# Patient Record
Sex: Male | Born: 1935 | Race: White | Hispanic: No | Smoking: Former smoker
Health system: Southern US, Community
[De-identification: ages and names within clinical notes are randomized; demographics above are authoritative.]

## PROBLEM LIST (undated history)

## (undated) DIAGNOSIS — K08109 Complete loss of teeth, unspecified cause, unspecified class: Secondary | ICD-10-CM

## (undated) DIAGNOSIS — K3184 Gastroparesis: Secondary | ICD-10-CM

## (undated) DIAGNOSIS — K5790 Diverticulosis of intestine, part unspecified, without perforation or abscess without bleeding: Secondary | ICD-10-CM

## (undated) DIAGNOSIS — K209 Esophagitis, unspecified without bleeding: Secondary | ICD-10-CM

## (undated) DIAGNOSIS — I10 Essential (primary) hypertension: Secondary | ICD-10-CM

## (undated) DIAGNOSIS — Z973 Presence of spectacles and contact lenses: Secondary | ICD-10-CM

## (undated) DIAGNOSIS — R339 Retention of urine, unspecified: Secondary | ICD-10-CM

## (undated) DIAGNOSIS — G629 Polyneuropathy, unspecified: Secondary | ICD-10-CM

## (undated) DIAGNOSIS — F32A Depression, unspecified: Secondary | ICD-10-CM

## (undated) DIAGNOSIS — G4733 Obstructive sleep apnea (adult) (pediatric): Secondary | ICD-10-CM

## (undated) DIAGNOSIS — R7303 Prediabetes: Secondary | ICD-10-CM

## (undated) DIAGNOSIS — I2510 Atherosclerotic heart disease of native coronary artery without angina pectoris: Secondary | ICD-10-CM

## (undated) DIAGNOSIS — M1990 Unspecified osteoarthritis, unspecified site: Secondary | ICD-10-CM

## (undated) DIAGNOSIS — I491 Atrial premature depolarization: Secondary | ICD-10-CM

## (undated) DIAGNOSIS — K449 Diaphragmatic hernia without obstruction or gangrene: Secondary | ICD-10-CM

## (undated) DIAGNOSIS — R55 Syncope and collapse: Secondary | ICD-10-CM

## (undated) DIAGNOSIS — Z860101 Personal history of adenomatous and serrated colon polyps: Secondary | ICD-10-CM

## (undated) DIAGNOSIS — N210 Calculus in bladder: Secondary | ICD-10-CM

## (undated) DIAGNOSIS — R9439 Abnormal result of other cardiovascular function study: Secondary | ICD-10-CM

## (undated) DIAGNOSIS — I4891 Unspecified atrial fibrillation: Secondary | ICD-10-CM

## (undated) DIAGNOSIS — Z86010 Personal history of colonic polyps: Secondary | ICD-10-CM

## (undated) DIAGNOSIS — C439 Malignant melanoma of skin, unspecified: Secondary | ICD-10-CM

## (undated) DIAGNOSIS — Z87442 Personal history of urinary calculi: Secondary | ICD-10-CM

## (undated) DIAGNOSIS — E538 Deficiency of other specified B group vitamins: Secondary | ICD-10-CM

## (undated) DIAGNOSIS — N401 Enlarged prostate with lower urinary tract symptoms: Secondary | ICD-10-CM

## (undated) DIAGNOSIS — F0390 Unspecified dementia without behavioral disturbance: Secondary | ICD-10-CM

## (undated) DIAGNOSIS — I700 Atherosclerosis of aorta: Secondary | ICD-10-CM

## (undated) DIAGNOSIS — I493 Ventricular premature depolarization: Principal | ICD-10-CM

## (undated) DIAGNOSIS — R011 Cardiac murmur, unspecified: Secondary | ICD-10-CM

## (undated) DIAGNOSIS — Z972 Presence of dental prosthetic device (complete) (partial): Secondary | ICD-10-CM

## (undated) DIAGNOSIS — H3530 Unspecified macular degeneration: Secondary | ICD-10-CM

## (undated) DIAGNOSIS — C801 Malignant (primary) neoplasm, unspecified: Secondary | ICD-10-CM

## (undated) DIAGNOSIS — I471 Supraventricular tachycardia: Secondary | ICD-10-CM

## (undated) DIAGNOSIS — E785 Hyperlipidemia, unspecified: Secondary | ICD-10-CM

## (undated) DIAGNOSIS — K219 Gastro-esophageal reflux disease without esophagitis: Secondary | ICD-10-CM

## (undated) DIAGNOSIS — Z8719 Personal history of other diseases of the digestive system: Secondary | ICD-10-CM

## (undated) DIAGNOSIS — I499 Cardiac arrhythmia, unspecified: Secondary | ICD-10-CM

## (undated) HISTORY — DX: Supraventricular tachycardia: I47.1

## (undated) HISTORY — PX: HEMORRHOID SURGERY: SHX153

## (undated) HISTORY — DX: Atherosclerosis of aorta: I70.0

## (undated) HISTORY — DX: Obstructive sleep apnea (adult) (pediatric): G47.33

## (undated) HISTORY — DX: Malignant melanoma of skin, unspecified: C43.9

## (undated) HISTORY — DX: Syncope and collapse: R55

## (undated) HISTORY — DX: Unspecified macular degeneration: H35.30

## (undated) HISTORY — DX: Hyperlipidemia, unspecified: E78.5

## (undated) HISTORY — DX: Essential (primary) hypertension: I10

## (undated) HISTORY — DX: Esophagitis, unspecified without bleeding: K20.90

## (undated) HISTORY — PX: NOSE SURGERY: SHX723

## (undated) HISTORY — DX: Gastro-esophageal reflux disease without esophagitis: K21.9

## (undated) HISTORY — DX: Atrial premature depolarization: I49.1

## (undated) HISTORY — PX: CATARACT EXTRACTION W/ INTRAOCULAR LENS IMPLANT: SHX1309

## (undated) HISTORY — PX: GASTRECTOMY: SHX58

## (undated) HISTORY — PX: TONSILLECTOMY: SUR1361

## (undated) HISTORY — DX: Diverticulosis of intestine, part unspecified, without perforation or abscess without bleeding: K57.90

## (undated) HISTORY — DX: Atherosclerotic heart disease of native coronary artery without angina pectoris: I25.10

## (undated) HISTORY — DX: Abnormal result of other cardiovascular function study: R94.39

## (undated) HISTORY — DX: Ventricular premature depolarization: I49.3

## (undated) HISTORY — PX: CATARACT EXTRACTION: SUR2

## (undated) HISTORY — PX: APPENDECTOMY: SHX54

---

## 1998-05-24 ENCOUNTER — Ambulatory Visit (HOSPITAL_COMMUNITY): Admission: RE | Admit: 1998-05-24 | Discharge: 1998-05-24 | Payer: Self-pay | Admitting: Internal Medicine

## 2001-12-17 ENCOUNTER — Emergency Department (HOSPITAL_COMMUNITY): Admission: EM | Admit: 2001-12-17 | Discharge: 2001-12-17 | Payer: Self-pay

## 2001-12-23 ENCOUNTER — Emergency Department (HOSPITAL_COMMUNITY): Admission: EM | Admit: 2001-12-23 | Discharge: 2001-12-23 | Payer: Self-pay | Admitting: Emergency Medicine

## 2002-01-19 ENCOUNTER — Ambulatory Visit (HOSPITAL_BASED_OUTPATIENT_CLINIC_OR_DEPARTMENT_OTHER): Admission: RE | Admit: 2002-01-19 | Discharge: 2002-01-19 | Payer: Self-pay | Admitting: Internal Medicine

## 2004-10-07 ENCOUNTER — Ambulatory Visit: Payer: Self-pay | Admitting: Internal Medicine

## 2005-04-17 ENCOUNTER — Ambulatory Visit: Payer: Self-pay | Admitting: Internal Medicine

## 2005-06-16 ENCOUNTER — Ambulatory Visit: Payer: Self-pay | Admitting: Internal Medicine

## 2005-09-30 ENCOUNTER — Ambulatory Visit: Payer: Self-pay | Admitting: Internal Medicine

## 2005-12-18 ENCOUNTER — Ambulatory Visit: Payer: Self-pay | Admitting: Internal Medicine

## 2005-12-25 ENCOUNTER — Ambulatory Visit: Payer: Self-pay | Admitting: Internal Medicine

## 2006-08-27 ENCOUNTER — Ambulatory Visit: Payer: Self-pay | Admitting: Internal Medicine

## 2006-11-16 DIAGNOSIS — Z85820 Personal history of malignant melanoma of skin: Secondary | ICD-10-CM

## 2006-11-16 HISTORY — DX: Personal history of malignant melanoma of skin: Z85.820

## 2007-03-17 ENCOUNTER — Ambulatory Visit: Payer: Self-pay | Admitting: Internal Medicine

## 2007-03-17 LAB — CONVERTED CEMR LAB
ALT: 20 units/L (ref 0–40)
AST: 27 units/L (ref 0–37)
Albumin: 4.2 g/dL (ref 3.5–5.2)
Alkaline Phosphatase: 65 units/L (ref 39–117)
BUN: 10 mg/dL (ref 6–23)
Bilirubin, Direct: 0.1 mg/dL (ref 0.0–0.3)
CO2: 37 meq/L — ABNORMAL HIGH (ref 19–32)
Calcium: 10.1 mg/dL (ref 8.4–10.5)
Chloride: 99 meq/L (ref 96–112)
Creatinine, Ser: 0.9 mg/dL (ref 0.4–1.5)
GFR calc Af Amer: 107 mL/min
GFR calc non Af Amer: 89 mL/min
Glucose, Bld: 97 mg/dL (ref 70–99)
Potassium: 4.2 meq/L (ref 3.5–5.1)
Sodium: 143 meq/L (ref 135–145)
TSH: 3.53 microintl units/mL (ref 0.35–5.50)
Total Bilirubin: 1.5 mg/dL — ABNORMAL HIGH (ref 0.3–1.2)
Total Protein: 7.2 g/dL (ref 6.0–8.3)

## 2007-05-02 ENCOUNTER — Encounter: Admission: RE | Admit: 2007-05-02 | Discharge: 2007-05-02 | Payer: Self-pay | Admitting: General Surgery

## 2007-05-05 HISTORY — PX: MELANOMA EXCISION: SHX5266

## 2007-05-06 ENCOUNTER — Ambulatory Visit (HOSPITAL_BASED_OUTPATIENT_CLINIC_OR_DEPARTMENT_OTHER): Admission: RE | Admit: 2007-05-06 | Discharge: 2007-05-06 | Payer: Self-pay | Admitting: General Surgery

## 2007-05-06 ENCOUNTER — Encounter (INDEPENDENT_AMBULATORY_CARE_PROVIDER_SITE_OTHER): Payer: Self-pay | Admitting: General Surgery

## 2007-08-31 ENCOUNTER — Ambulatory Visit: Payer: Self-pay | Admitting: Internal Medicine

## 2007-09-23 DIAGNOSIS — G4733 Obstructive sleep apnea (adult) (pediatric): Secondary | ICD-10-CM | POA: Insufficient documentation

## 2007-09-23 DIAGNOSIS — K219 Gastro-esophageal reflux disease without esophagitis: Secondary | ICD-10-CM | POA: Insufficient documentation

## 2007-09-23 DIAGNOSIS — I10 Essential (primary) hypertension: Secondary | ICD-10-CM | POA: Insufficient documentation

## 2007-09-23 HISTORY — DX: Obstructive sleep apnea (adult) (pediatric): G47.33

## 2007-09-26 ENCOUNTER — Ambulatory Visit: Payer: Self-pay | Admitting: Internal Medicine

## 2007-09-26 DIAGNOSIS — Z85820 Personal history of malignant melanoma of skin: Secondary | ICD-10-CM | POA: Insufficient documentation

## 2007-09-27 LAB — CONVERTED CEMR LAB
ALT: 22 units/L (ref 0–53)
AST: 25 units/L (ref 0–37)
Albumin: 4 g/dL (ref 3.5–5.2)
Alkaline Phosphatase: 59 units/L (ref 39–117)
BUN: 9 mg/dL (ref 6–23)
Basophils Absolute: 0 10*3/uL (ref 0.0–0.1)
Basophils Relative: 0.5 % (ref 0.0–1.0)
Bilirubin, Direct: 0.3 mg/dL (ref 0.0–0.3)
CO2: 36 meq/L — ABNORMAL HIGH (ref 19–32)
Calcium: 9.9 mg/dL (ref 8.4–10.5)
Chloride: 98 meq/L (ref 96–112)
Cholesterol: 194 mg/dL (ref 0–200)
Creatinine, Ser: 0.9 mg/dL (ref 0.4–1.5)
Eosinophils Absolute: 0.3 10*3/uL (ref 0.0–0.6)
Eosinophils Relative: 4 % (ref 0.0–5.0)
GFR calc Af Amer: 107 mL/min
GFR calc non Af Amer: 89 mL/min
Glucose, Bld: 83 mg/dL (ref 70–99)
HCT: 48.2 % (ref 39.0–52.0)
HDL: 32.7 mg/dL — ABNORMAL LOW (ref >39.0)
Hemoglobin: 16.9 g/dL (ref 13.0–17.0)
LDL Cholesterol: 132 mg/dL — ABNORMAL HIGH (ref 0–99)
Lymphocytes Relative: 25.3 % (ref 12.0–46.0)
MCHC: 35.1 g/dL (ref 30.0–36.0)
MCV: 90.9 fL (ref 78.0–100.0)
Monocytes Absolute: 0.7 10*3/uL (ref 0.2–0.7)
Monocytes Relative: 10.5 % (ref 3.0–11.0)
Neutro Abs: 4.3 10*3/uL (ref 1.4–7.7)
Neutrophils Relative %: 59.7 % (ref 43.0–77.0)
Platelets: 154 10*3/uL (ref 150–400)
Potassium: 3.6 meq/L (ref 3.5–5.1)
RBC: 5.31 M/uL (ref 4.22–5.81)
RDW: 11.8 % (ref 11.5–14.6)
Sodium: 143 meq/L (ref 135–145)
Total Bilirubin: 1.7 mg/dL — ABNORMAL HIGH (ref 0.3–1.2)
Total CHOL/HDL Ratio: 5.9
Total Protein: 6.9 g/dL (ref 6.0–8.3)
Triglycerides: 145 mg/dL (ref 0–149)
VLDL: 29 mg/dL (ref 0–40)
WBC: 7.1 10*3/uL (ref 4.5–10.5)

## 2007-10-17 LAB — CONVERTED CEMR LAB: PSA: NORMAL ng/mL

## 2008-01-15 LAB — HM COLONOSCOPY: HM Colonoscopy: NORMAL

## 2008-02-13 ENCOUNTER — Encounter: Payer: Self-pay | Admitting: Internal Medicine

## 2008-04-04 ENCOUNTER — Ambulatory Visit: Payer: Self-pay | Admitting: Internal Medicine

## 2008-10-09 ENCOUNTER — Ambulatory Visit: Payer: Self-pay | Admitting: Internal Medicine

## 2008-10-10 LAB — CONVERTED CEMR LAB
BUN: 10 mg/dL (ref 6–23)
CO2: 38 meq/L — ABNORMAL HIGH (ref 19–32)
Calcium: 9.8 mg/dL (ref 8.4–10.5)
Chloride: 100 meq/L (ref 96–112)
Cholesterol: 184 mg/dL (ref 0–200)
Creatinine, Ser: 0.8 mg/dL (ref 0.4–1.5)
GFR calc Af Amer: 123 mL/min
GFR calc non Af Amer: 101 mL/min
Glucose, Bld: 98 mg/dL (ref 70–99)
HDL: 42.8 mg/dL (ref >39.0)
LDL Cholesterol: 126 mg/dL — ABNORMAL HIGH (ref 0–99)
Potassium: 3.9 meq/L (ref 3.5–5.1)
Sodium: 144 meq/L (ref 135–145)
TSH: 2.91 microintl units/mL (ref 0.35–5.50)
Total CHOL/HDL Ratio: 4.3
Triglycerides: 77 mg/dL (ref 0–149)
VLDL: 15 mg/dL (ref 0–40)

## 2008-11-16 DIAGNOSIS — I493 Ventricular premature depolarization: Secondary | ICD-10-CM

## 2008-11-16 DIAGNOSIS — Z87898 Personal history of other specified conditions: Secondary | ICD-10-CM

## 2008-11-16 HISTORY — DX: Ventricular premature depolarization: I49.3

## 2008-11-16 HISTORY — DX: Personal history of other specified conditions: Z87.898

## 2009-04-02 ENCOUNTER — Ambulatory Visit: Payer: Self-pay | Admitting: Internal Medicine

## 2009-04-16 ENCOUNTER — Ambulatory Visit: Payer: Self-pay

## 2009-04-16 ENCOUNTER — Encounter: Payer: Self-pay | Admitting: Cardiology

## 2009-04-16 ENCOUNTER — Encounter: Payer: Self-pay | Admitting: Internal Medicine

## 2009-04-17 ENCOUNTER — Encounter: Payer: Self-pay | Admitting: Internal Medicine

## 2009-10-07 ENCOUNTER — Ambulatory Visit: Payer: Self-pay | Admitting: Internal Medicine

## 2009-10-09 LAB — CONVERTED CEMR LAB
ALT: 20 units/L (ref 0–53)
AST: 29 units/L (ref 0–37)
Albumin: 4.3 g/dL (ref 3.5–5.2)
Alkaline Phosphatase: 62 units/L (ref 39–117)
BUN: 8 mg/dL (ref 6–23)
Basophils Absolute: 0 10*3/uL (ref 0.0–0.1)
Basophils Relative: 0.7 % (ref 0.0–3.0)
Bilirubin, Direct: 0.1 mg/dL (ref 0.0–0.3)
CO2: 38 meq/L — ABNORMAL HIGH (ref 19–32)
Calcium: 9.8 mg/dL (ref 8.4–10.5)
Chloride: 95 meq/L — ABNORMAL LOW (ref 96–112)
Cholesterol: 191 mg/dL (ref 0–200)
Creatinine, Ser: 0.9 mg/dL (ref 0.4–1.5)
Eosinophils Absolute: 0.1 10*3/uL (ref 0.0–0.7)
Eosinophils Relative: 2.9 % (ref 0.0–5.0)
GFR calc non Af Amer: 87.94 mL/min (ref >60)
Glucose, Bld: 112 mg/dL — ABNORMAL HIGH (ref 70–99)
HCT: 50 % (ref 39.0–52.0)
HDL: 41.7 mg/dL (ref >39.00)
Hemoglobin: 17.1 g/dL — ABNORMAL HIGH (ref 13.0–17.0)
LDL Cholesterol: 133 mg/dL — ABNORMAL HIGH (ref 0–99)
Lymphocytes Relative: 21.5 % (ref 12.0–46.0)
Lymphs Abs: 1.1 10*3/uL (ref 0.7–4.0)
MCHC: 34.1 g/dL (ref 30.0–36.0)
MCV: 95.3 fL (ref 78.0–100.0)
Monocytes Absolute: 0.5 10*3/uL (ref 0.1–1.0)
Monocytes Relative: 9.9 % (ref 3.0–12.0)
Neutro Abs: 3.3 10*3/uL (ref 1.4–7.7)
Neutrophils Relative %: 65 % (ref 43.0–77.0)
PSA: 1.63 ng/mL (ref 0.10–4.00)
Platelets: 146 10*3/uL — ABNORMAL LOW (ref 150.0–400.0)
Potassium: 3.3 meq/L — ABNORMAL LOW (ref 3.5–5.1)
RBC: 5.24 M/uL (ref 4.22–5.81)
RDW: 12.4 % (ref 11.5–14.6)
Sodium: 142 meq/L (ref 135–145)
TSH: 3.64 microintl units/mL (ref 0.35–5.50)
Total Bilirubin: 1.6 mg/dL — ABNORMAL HIGH (ref 0.3–1.2)
Total CHOL/HDL Ratio: 5
Total Protein: 7.4 g/dL (ref 6.0–8.3)
Triglycerides: 81 mg/dL (ref 0.0–149.0)
VLDL: 16.2 mg/dL (ref 0.0–40.0)
WBC: 5 10*3/uL (ref 4.5–10.5)

## 2009-11-20 ENCOUNTER — Ambulatory Visit: Payer: Self-pay | Admitting: Internal Medicine

## 2009-11-21 LAB — CONVERTED CEMR LAB
BUN: 8 mg/dL (ref 6–23)
CO2: 32 meq/L (ref 19–32)
Calcium: 9.3 mg/dL (ref 8.4–10.5)
Chloride: 101 meq/L (ref 96–112)
Creatinine, Ser: 0.8 mg/dL (ref 0.4–1.5)
GFR calc non Af Amer: 100.71 mL/min (ref >60)
Glucose, Bld: 102 mg/dL — ABNORMAL HIGH (ref 70–99)
Potassium: 4.1 meq/L (ref 3.5–5.1)
Sodium: 139 meq/L (ref 135–145)

## 2010-04-07 ENCOUNTER — Ambulatory Visit: Payer: Self-pay | Admitting: Internal Medicine

## 2010-05-01 ENCOUNTER — Encounter: Payer: Self-pay | Admitting: Internal Medicine

## 2010-08-23 ENCOUNTER — Emergency Department (HOSPITAL_COMMUNITY): Admission: EM | Admit: 2010-08-23 | Discharge: 2010-08-23 | Payer: Self-pay | Admitting: Emergency Medicine

## 2010-10-01 ENCOUNTER — Ambulatory Visit: Payer: Self-pay | Admitting: Internal Medicine

## 2010-10-01 ENCOUNTER — Encounter: Payer: Self-pay | Admitting: Internal Medicine

## 2010-10-01 ENCOUNTER — Ambulatory Visit: Payer: Self-pay | Admitting: Family Medicine

## 2010-10-02 ENCOUNTER — Encounter: Payer: Self-pay | Admitting: Internal Medicine

## 2010-10-07 LAB — CONVERTED CEMR LAB
BUN: 10 mg/dL (ref 6–23)
Basophils Absolute: 0 10*3/uL (ref 0.0–0.1)
Basophils Relative: 0.3 % (ref 0.0–3.0)
CO2: 32 meq/L (ref 19–32)
Calcium: 9.2 mg/dL (ref 8.4–10.5)
Chloride: 101 meq/L (ref 96–112)
Cholesterol: 185 mg/dL (ref 0–200)
Creatinine, Ser: 0.9 mg/dL (ref 0.4–1.5)
Eosinophils Absolute: 0.2 10*3/uL (ref 0.0–0.7)
Eosinophils Relative: 3.4 % (ref 0.0–5.0)
GFR calc non Af Amer: 93.68 mL/min (ref >60)
Glucose, Bld: 74 mg/dL (ref 70–99)
HCT: 44.9 % (ref 39.0–52.0)
HDL: 47.2 mg/dL (ref >39.00)
Hemoglobin: 15.3 g/dL (ref 13.0–17.0)
LDL Cholesterol: 121 mg/dL — ABNORMAL HIGH (ref 0–99)
Lymphocytes Relative: 16.7 % (ref 12.0–46.0)
Lymphs Abs: 0.9 10*3/uL (ref 0.7–4.0)
MCHC: 34.1 g/dL (ref 30.0–36.0)
MCV: 94 fL (ref 78.0–100.0)
Monocytes Absolute: 0.5 10*3/uL (ref 0.1–1.0)
Monocytes Relative: 9.5 % (ref 3.0–12.0)
Neutro Abs: 3.9 10*3/uL (ref 1.4–7.7)
Neutrophils Relative %: 70.1 % (ref 43.0–77.0)
PSA: 2.42 ng/mL (ref 0.10–4.00)
Platelets: 130 10*3/uL — ABNORMAL LOW (ref 150.0–400.0)
Potassium: 3.6 meq/L (ref 3.5–5.1)
RBC: 4.78 M/uL (ref 4.22–5.81)
RDW: 13.1 % (ref 11.5–14.6)
Sodium: 140 meq/L (ref 135–145)
TSH: 2.85 microintl units/mL (ref 0.35–5.50)
Total CHOL/HDL Ratio: 4
Triglycerides: 83 mg/dL (ref 0.0–149.0)
VLDL: 16.6 mg/dL (ref 0.0–40.0)
WBC: 5.6 10*3/uL (ref 4.5–10.5)

## 2010-12-16 NOTE — Procedures (Signed)
Summary: summary report  summary report   Imported By: Mirna Mires 04/29/2009 14:37:49  _____________________________________________________________________  External Attachment:    Type:   Image     Comment:   External Document

## 2010-12-16 NOTE — Assessment & Plan Note (Signed)
Summary: 6 month f/up//db   Vital Signs:  Patient Profile:   76 Years Old Male Weight:      177 pounds Temp:     98.4 degrees F Pulse rate:   64 / minute BP sitting:   122 / 68  (left arm)  Vitals Entered By: Gladis Riffle, RN (Apr 04, 2008 11:12 AM)                 Chief Complaint:  6 month rov--staes pecid was upgraded since ECG and is working well.  History of Present Illness:  Follow-Up Visit      This is a 76 year old man who presents for Follow-up visit.  The patient denies chest pain, palpitations, dizziness, syncope, low blood sugar symptoms, high blood sugar symptoms, edema, SOB, DOE, PND, and orthopnea.  Since the last visit the patient notes no new problems or concerns.  The patient reports taking meds as prescribed.  When questioned about possible medication side effects, the patient notes none.    Past Medical History: GERD Hypertension syncope obstructive sleep apnea dyslipidemia melanoma Past Surgical History: Gastrectomy, partial (ulcers)  1970's Hemorrhoidectomy melanoma-excisec from left arm 2008 Social History: Married Former Smoker Regular exercise-no  Family History: pneumonia  father age 79 CABG  mother stomach surgery--brother breast cancer--sister Current Meds:  PEPCID 20 MG  TABS (FAMOTIDINE) two times a day--states was changed after ecg, not sure of med REGLAN 10 MG  TABS (METOCLOPRAMIDE HCL) Take 1 tablet by mouth two times a day HYDROCHLOROTHIAZIDE 25 MG  TABS (HYDROCHLOROTHIAZIDE) Take 1 tablet by mouth once a day      Current Allergies (reviewed today): No known allergies       Physical Exam  General:     Well-developed,well-nourished,in no acute distress; alert,appropriate and cooperative throughout examination Head:     normocephalic and atraumatic.   Eyes:     pupils equal and pupils round.   Ears:     R ear normal and L ear normal.   Nose:     External nasal examination shows no deformity or inflammation. Nasal  mucosa are pink and moist without lesions or exudates. Neck:     No deformities, masses, or tenderness noted. Chest Wall:     No deformities, masses, tenderness or gynecomastia noted. Lungs:     Normal respiratory effort, chest expands symmetrically. Lungs are clear to auscultation, no crackles or wheezes. Heart:     Normal rate and regular rhythm. S1 and S2 normal without gallop, murmur, click, rub or other extra sounds. Abdomen:     Bowel sounds positive,abdomen soft and non-tender without masses, organomegaly or hernias noted. Msk:     No deformity or scoliosis noted of thoracic or lumbar spine.   Pulses:     R radial normal and L radial normal.   Extremities:     trace left pedal edema and trace right pedal edema.   Neurologic:     cranial nerves II-XII intact and gait normal.   Skin:     Intact without suspicious lesions or rashes Cervical Nodes:     no anterior cervical adenopathy and no posterior cervical adenopathy.   Psych:     normally interactive and good eye contact.      Impression & Recommendations:  Problem # 1:  HYPERTENSION (ICD-401.9) continue meds His updated medication list for this problem includes:    Hydrochlorothiazide 25 Mg Tabs (Hydrochlorothiazide) .Marland Kitchen... Take 1 tablet by mouth once a day  BP today: 122/68  Prior BP: 120/82 (09/26/2007)  Labs Reviewed: Creat: 0.9 (09/26/2007) Chol: 194 (09/26/2007)   HDL: 32.7 (09/26/2007)   LDL: 132 (09/26/2007)   TG: 145 (09/26/2007)   Problem # 2:  GERD (ICD-530.81) no sxs on meds His updated medication list for this problem includes:    Pepcid 20 Mg Tabs (Famotidine) .Marland Kitchen..Marland Kitchen Two times a day--states was changed after ecg, not sure of med  Diagnostics Reviewed:  Discussed lifestyle modifications, diet, antacids/medications, and preventive measures. Handout provided.   Problem # 3:  DYSLIPIDEMIA (ICD-272.4) discussed diet and exercise to help increase HDL Labs Reviewed: Chol: 194 (09/26/2007)   HDL: 32.7  (09/26/2007)   LDL: 132 (09/26/2007)   TG: 145 (09/26/2007) SGOT: 25 (09/26/2007)   SGPT: 22 (09/26/2007)   Complete Medication List: 1)  Pepcid 20 Mg Tabs (Famotidine) .... Two times a day--states was changed after ecg, not sure of med 2)  Reglan 10 Mg Tabs (Metoclopramide hcl) .... Take 1 tablet by mouth two times a day 3)  Hydrochlorothiazide 25 Mg Tabs (Hydrochlorothiazide) .... Take 1 tablet by mouth once a day   Patient Instructions: 1)  Please schedule a follow-up appointment in 6 months.   ]

## 2010-12-16 NOTE — Letter (Signed)
Summary: [facility]  [facility]   Imported By: Maryln Gottron 05/12/2010 14:57:58  _____________________________________________________________________  External Attachment:    Type:   Image     Comment:   External Document

## 2010-12-16 NOTE — Assessment & Plan Note (Signed)
Summary: 6 month rov/njr/PT RESCD FROM BUMP//CCM   Vital Signs:  Patient profile:   76 year old male Height:      70.5 inches Weight:      175 pounds BMI:     24.84 Temp:     98.4 degrees F oral Pulse rate:   78 / minute Pulse rhythm:   regular Resp:     16 per minute BP sitting:   132 / 72  Vitals Entered By: Lynann Beaver CMA (Apr 02, 2009 12:10 PM)   History of Present Illness:  Follow-Up Visit      This is a 76 year old man who presents for Follow-up visit.  The patient denies chest pain, palpitations, dizziness, syncope, low blood sugar symptoms, high blood sugar symptoms, edema, SOB, DOE, PND, and orthopnea.  Since the last visit the patient notes being seen by a specialist.  The patient reports taking meds as prescribed.  When questioned about possible medication side effects, the patient notes none.    seeing ophthalmologist---intraocular avastin injection has had recent skin surgery says he can feel heart beat fast when he mows his yard---persistent symptoms for hours (when he walks for exercise no sxs)  no other complaints in a complete ROS   Current Medications (verified): 1)  Reglan 10 Mg  Tabs (Metoclopramide Hcl) .... Take 1 Tablet By Mouth Two Times A Day 2)  Hydrochlorothiazide 25 Mg  Tabs (Hydrochlorothiazide) .... Take 1 Tablet By Mouth Once A Day 3)  Protonix 40 Mg Tbec (Pantoprazole Sodium) .... One By Mouth Q Day 4)  Avastin 400 Mg/64ml Soln (Bevacizumab) .... Injection As Directed  Allergies (verified): No Known Drug Allergies  Past History:  Past Medical History:    GERD    Hypertension    syncope    obstructive sleep apnea    dyslipidemia    melanoma (09/26/2007)  Family History:    pneumonia  father age 40    CABG  mother    stomach surgery--brother    breast cancer--sister (09/23/2007)  Social History:    Married    Former Smoker    Regular exercise-no     (09/26/2007)  Risk Factors:    Smoking Status: quit (09/26/2007)  Packs/Day: N/A    Cigars/wk: N/A    Pipe Use/wk: N/A    Cans of tobacco/wk: N/A    Passive Smoke Exposure: N/A  Past Surgical History:    Gastrectomy, partial (ulcers)  1970's    Hemorrhoidectomy    melanoma-excisec from left arm 2008    skin surgery--nose  Review of Systems       no other complaints in a complete ROS except feels slightly lightheaded when he has periods of palpitations ("fast heart rate---mowing the lawn)  Physical Exam  General:  Well-developed,well-nourished,in no acute distress; alert,appropriate and cooperative throughout examination Head:  normocephalic and atraumatic.   Eyes:  pupils equal and pupils round.   Ears:  R ear normal and L ear normal.   Neck:  No deformities, masses, or tenderness noted. Chest Wall:  No deformities, masses, tenderness or gynecomastia noted. Lungs:  Normal respiratory effort, chest expands symmetrically. Lungs are clear to auscultation, no crackles or wheezes. Abdomen:  Bowel sounds positive,abdomen soft and non-tender without masses, organomegaly or hernias noted. Msk:  No deformity or scoliosis noted of thoracic or lumbar spine.   Pulses:  R radial normal and L radial normal.   Skin:  turgor normal and color normal.   Psych:  good eye contact  and not anxious appearing.     Impression & Recommendations:  Problem # 1:  PALPITATIONS (ICD-785.1) unclear etiology needs further evaluation.  Check Holter monitor.  Check echocardiogram.  EKG done today in the office.  See results. Orders: EKG w/ Interpretation (93000) Echo Referral (Echo) Cardiology Referral (Cardiology)  Problem # 2:  MELANOMA, ARM (ICD-172.6) has yearly dermatology exam  Problem # 3:  HYPERTENSION (ICD-401.9) continue current medications. His updated medication list for this problem includes:    Hydrochlorothiazide 25 Mg Tabs (Hydrochlorothiazide) .Marland Kitchen... Take 1 tablet by mouth once a day  BP today: 132/72 Prior BP: 104/68 (10/09/2008)  Labs Reviewed:  K+: 3.9 (10/09/2008) Creat: : 0.8 (10/09/2008)   Chol: 184 (10/09/2008)   HDL: 42.8 (10/09/2008)   LDL: 126 (10/09/2008)   TG: 77 (10/09/2008)  Problem # 4:  GERD (ICD-530.81) well-controlled on current medications. The following medications were removed from the medication list:    Pepcid 20 Mg Tabs (Famotidine) .Marland Kitchen..Marland Kitchen Two times a day--states was changed after ecg, not sure of med His updated medication list for this problem includes:    Protonix 40 Mg Tbec (Pantoprazole sodium) ..... One by mouth q day  Complete Medication List: 1)  Reglan 10 Mg Tabs (Metoclopramide hcl) .... Take 1 tablet by mouth two times a day 2)  Hydrochlorothiazide 25 Mg Tabs (Hydrochlorothiazide) .... Take 1 tablet by mouth once a day 3)  Protonix 40 Mg Tbec (Pantoprazole sodium) .... One by mouth q day 4)  Avastin 400 Mg/82ml Soln (Bevacizumab) .... Injection as directed

## 2010-12-16 NOTE — Assessment & Plan Note (Signed)
Summary: 6 month fup-will fast//ccm/ flu shot/njr   Vital Signs:  Patient profile:   76 year old male Height:      70.5 inches Weight:      172 pounds BMI:     24.42 Pulse rate:   64 / minute Resp:     12 per minute BP sitting:   134 / 70  (left arm)  Vitals Entered By: Gladis Riffle, RN (October 07, 2009 7:52 AM)   History of Present Illness:  Follow-Up Visit      This is a 76 year old man who presents for Follow-up visit.  The patient denies chest pain, palpitations, dizziness, syncope, edema, SOB, DOE, PND, and orthopnea.  Since the last visit the patient notes no new problems or concerns.  The patient reports taking meds as prescribed.  When questioned about possible medication side effects, the patient notes none.    All other systems reviewed and were negative   Preventive Screening-Counseling & Management  Alcohol-Tobacco     Smoking Status: quit > 6 months     Year Started: 1945     Year Quit: 1949  Current Problems (verified): 1)  Palpitations  (ICD-785.1) 2)  Melanoma, Arm  (ICD-172.6) 3)  Dyslipidemia  (ICD-272.4) 4)  Sleep Apnea, Obstructive  (ICD-327.23) 5)  Syncope  (ICD-780.2) 6)  Hypertension  (ICD-401.9) 7)  Gerd  (ICD-530.81)  Allergies (verified): No Known Drug Allergies  Comments:  Nurse/Medical Assistant: 6 month rov, fasting  The patient's medications and allergies were reviewed with the patient and were updated in the Medication and Allergy Lists. Gladis Riffle, RN (October 07, 2009 7:54 AM)  Flu Vaccine Consent Questions     Do you have a history of severe allergic reactions to this vaccine? no    Any prior history of allergic reactions to egg and/or gelatin? no    Do you have a sensitivity to the preservative Thimersol? no    Do you have a past history of Guillan-Barre Syndrome? no    Do you currently have an acute febrile illness? no    Have you ever had a severe reaction to latex? no    Vaccine information given and explained to patient?  yes    Are you currently pregnant? no    Lot Number:[id number]   Exp Date:05/15/2010   Site Given  Left Deltoid IM   Past History:  Past Medical History: Last updated: 09/26/2007 GERD Hypertension syncope obstructive sleep apnea dyslipidemia melanoma  Past Surgical History: Last updated: 04/02/2009 Gastrectomy, partial (ulcers)  1970's Hemorrhoidectomy melanoma-excisec from left arm 2008 skin surgery--nose  Family History: Last updated: 09/23/2007 pneumonia  father age 52 CABG  mother stomach surgery--brother breast cancer--sister  Social History: Last updated: 09/26/2007 Married Former Smoker Regular exercise-no  Risk Factors: Exercise: no (09/26/2007)  Risk Factors: Smoking Status: quit > 6 months (10/07/2009)  Social History: Smoking Status:  quit > 6 months  Physical Exam  General:  Well-developed,well-nourished,in no acute distress; alert,appropriate and cooperative throughout examination Head:  normocephalic and atraumatic.   Eyes:  pupils equal and pupils round.   Ears:  R ear normal and L ear normal.   Neck:  No deformities, masses, or tenderness noted. Chest Wall:  No deformities, masses, tenderness or gynecomastia noted. Lungs:  Normal respiratory effort, chest expands symmetrically. Lungs are clear to auscultation, no crackles or wheezes. Heart:  Normal rate and regular rhythm. S1 and S2 normal without gallop, murmur, click, rub or other extra sounds. Abdomen:  Bowel sounds  positive,abdomen soft and non-tender without masses, organomegaly or hernias noted. Msk:  No deformity or scoliosis noted of thoracic or lumbar spine.   Pulses:  R radial normal and L radial normal.   Neurologic:  cranial nerves II-XII intact and gait normal.   Skin:  turgor normal and color normal.   Cervical Nodes:  no anterior cervical adenopathy and no posterior cervical adenopathy.   Psych:  memory intact for recent and remote and good eye contact.     Impression &  Recommendations:  Problem # 1:  MELANOMA, ARM (ICD-172.6)  no known recurrence  Orders: Venipuncture (16109) TLB-BMP (Basic Metabolic Panel-BMET) (80048-METABOL) TLB-Hepatic/Liver Function Pnl (80076-HEPATIC) TLB-CBC Platelet - w/Differential (85025-CBCD)  Problem # 2:  HYPERTENSION (ICD-401.9)  no sxs continue current medications  His updated medication list for this problem includes:    Hydrochlorothiazide 25 Mg Tabs (Hydrochlorothiazide) .Marland Kitchen... Take 1 tablet by mouth once a day  BP today: 134/70 Prior BP: 132/72 (04/02/2009)  Labs Reviewed: K+: 3.9 (10/09/2008) Creat: : 0.8 (10/09/2008)   Chol: 184 (10/09/2008)   HDL: 42.8 (10/09/2008)   LDL: 126 (10/09/2008)   TG: 77 (10/09/2008)  Orders: TLB-TSH (Thyroid Stimulating Hormone) (84443-TSH)  Problem # 3:  GERD (ICD-530.81) well controlled on meds continue current medications  His updated medication list for this problem includes:    Protonix 40 Mg Tbec (Pantoprazole sodium) ..... One by mouth q day  Problem # 4:  DYSLIPIDEMIA (ICD-272.4)  no meds  improve hdl by daily exercise Labs Reviewed: SGOT: 25 (09/26/2007)   SGPT: 22 (09/26/2007)   HDL:42.8 (10/09/2008), 32.7 (09/26/2007)  LDL:126 (10/09/2008), 132 (09/26/2007)  Chol:184 (10/09/2008), 194 (09/26/2007)  Trig:77 (10/09/2008), 145 (09/26/2007)  Orders: TLB-Lipid Panel (80061-LIPID)  Complete Medication List: 1)  Reglan 10 Mg Tabs (Metoclopramide hcl) .... Take 1 tablet by mouth two times a day 2)  Hydrochlorothiazide 25 Mg Tabs (Hydrochlorothiazide) .... Take 1 tablet by mouth once a day 3)  Protonix 40 Mg Tbec (Pantoprazole sodium) .... One by mouth q day 4)  Avastin 400 Mg/39ml Soln (Bevacizumab) .... Injection as directed  Other Orders: Flu Vaccine 13yrs + (60454) Administration Flu vaccine - MCR (G0008) TLB-PSA (Prostate Specific Antigen) (84153-PSA)  Patient Instructions: 1)  Please schedule a follow-up appointment in 6 months.

## 2010-12-16 NOTE — Assessment & Plan Note (Signed)
Summary: flu shot /njr  Nurse Visit       Influenza Vaccine    Vaccine Type: Fluvax MCR    Given by: Arcola Jansky, RN  Flu Vaccine Consent Questions    Do you have a history of severe allergic reactions to this vaccine? no    Any prior history of allergic reactions to egg and/or gelatin? no    Do you have a sensitivity to the preservative Thimersol? no    Do you have a past history of Guillan-Barre Syndrome? no    Do you currently have an acute febrile illness? no    Have you ever had a severe reaction to latex? no    Vaccine information given and explained to patient? yes   Orders Added: 1)  Influenza Vaccine MCR [00025]   Impression & Recommendations:  Problem # 1:  Preventive Health Care (ICD-V70.0) lot [id number], EXP 30 jun 09, sanofi pasteur left deltoid IM, 0.5 cc.   Other Orders: Influenza Vaccine MCR [phone])    ]

## 2010-12-16 NOTE — Assessment & Plan Note (Signed)
Summary: 6 week rov/et   Vital Signs:  Patient profile:   76 year old male Weight:      174 pounds Temp:     97.5 degrees F Pulse rate:   60 / minute Resp:     12 per minute BP sitting:   152 / 80  (left arm)  Vitals Entered By: Gladis Riffle, RN (November 20, 2009 11:18 AM)  Serial Vital Signs/Assessments:  Time      Position  BP       Pulse  Resp  Temp     By                     130/75                         Birdie Sons MD    History of Present Illness:  Follow-Up Visit      This is a 76 year old man who presents for Follow-up visit.  The patient denies chest pain, palpitations, dizziness, syncope, edema, SOB, DOE, PND, and orthopnea.  Since the last visit the patient notes no new problems or concerns.  The patient reports taking meds as prescribed.  When questioned about possible medication side effects, the patient notes none.   continue current medications   Preventive Screening-Counseling & Management  Alcohol-Tobacco     Smoking Status: quit > 6 months     Year Started: 1945     Year Quit: 1949  Current Problems (verified): 1)  Hypokalemia  (ICD-276.8) 2)  Special Screening Malignant Neoplasm of Prostate  (ICD-V76.44) 3)  Melanoma, Arm  (ICD-172.6) 4)  Dyslipidemia  (ICD-272.4) 5)  Sleep Apnea, Obstructive  (ICD-327.23) 6)  Hypertension  (ICD-401.9) 7)  Gerd  (ICD-530.81)  Current Medications (verified): 1)  Reglan 10 Mg  Tabs (Metoclopramide Hcl) .... Take 1 Tablet By Mouth Two Times A Day 2)  Protonix 40 Mg Tbec (Pantoprazole Sodium) .... One By Mouth Q Day 3)  Avastin 400 Mg/40ml Soln (Bevacizumab) .... Injection As Directed 4)  Lisinopril 5 Mg Tabs (Lisinopril) .... Take 1 Tablet By Mouth Once A Day  Allergies (verified): No Known Drug Allergies  Comments:  Nurse/Medical Assistant: 6 week rov--cataract left,  removed  The patient's medications and allergies were reviewed with the patient and were updated in the Medication and Allergy Lists. Gladis Riffle, RN (November 20, 2009 11:19 AM)  Past History:  Past Medical History: Last updated: 09/26/2007 GERD Hypertension syncope obstructive sleep apnea dyslipidemia melanoma  Family History: Last updated: 09/23/2007 pneumonia  father age 55 CABG  mother stomach surgery--brother breast cancer--sister  Social History: Last updated: 09/26/2007 Married Former Smoker Regular exercise-no  Risk Factors: Exercise: no (09/26/2007)  Risk Factors: Smoking Status: quit > 6 months (11/20/2009)  Past Surgical History: Gastrectomy, partial (ulcers)  76's Hemorrhoidectomy melanoma-excisec from left arm 2008 skin surgery--nose Cataract extraction--left 12/10  Review of Systems       All other systems reviewed and were negative   Physical Exam  General:  Well-developed,well-nourished,in no acute distress; alert,appropriate and cooperative throughout examination Head:  normocephalic and atraumatic.   Eyes:  pupils equal and pupils round.   Neck:  No deformities, masses, or tenderness noted. Chest Wall:  No deformities, masses, tenderness or gynecomastia noted. Lungs:  Normal respiratory effort, chest expands symmetrically. Lungs are clear to auscultation, no crackles or wheezes. Heart:  Normal rate and regular rhythm. S1 and S2 normal without gallop,  murmur, click, rub or other extra sounds. Abdomen:  Bowel sounds positive,abdomen soft and non-tender without masses, organomegaly or hernias noted. Msk:  No deformity or scoliosis noted of thoracic or lumbar spine.   Pulses:  R radial normal and L radial normal.   Neurologic:  cranial nerves II-XII intact and gait normal.     Impression & Recommendations:  Problem # 1:  HYPERTENSION (ICD-401.9) see serial assessment check K+ today His updated medication list for this problem includes:    Lisinopril 5 Mg Tabs (Lisinopril) .Marland Kitchen... Take 1 tablet by mouth once a day  BP today: 152/80 Prior BP: 134/70 (10/07/2009)  Labs  Reviewed: K+: 3.3 (10/07/2009) Creat: : 0.9 (10/07/2009)   Chol: 191 (10/07/2009)   HDL: 41.70 (10/07/2009)   LDL: 133 (10/07/2009)   TG: 81.0 (10/07/2009)  Problem # 2:  DYSLIPIDEMIA (ICD-272.4) controlled  no need for f/u Labs Reviewed: SGOT: 29 (10/07/2009)   SGPT: 20 (10/07/2009)   HDL:41.70 (10/07/2009), 42.8 (10/09/2008)  LDL:133 (10/07/2009), 126 (10/09/2008)  Chol:191 (10/07/2009), 184 (10/09/2008)  Trig:81.0 (10/07/2009), 77 (10/09/2008)  Problem # 3:  GERD (ICD-530.81) well controlled continue current medications  His updated medication list for this problem includes:    Protonix 40 Mg Tbec (Pantoprazole sodium) ..... One by mouth q day  Complete Medication List: 1)  Reglan 10 Mg Tabs (Metoclopramide hcl) .... Take 1 tablet by mouth two times a day 2)  Protonix 40 Mg Tbec (Pantoprazole sodium) .... One by mouth q day 3)  Avastin 400 Mg/67ml Soln (Bevacizumab) .... Injection as directed 4)  Lisinopril 5 Mg Tabs (Lisinopril) .... Take 1 tablet by mouth once a day  Other Orders: Venipuncture (60454) TLB-BMP (Basic Metabolic Panel-BMET) (80048-METABOL)

## 2010-12-16 NOTE — Assessment & Plan Note (Signed)
Vital Signs:  Patient profile:   76 year old male Height:      70.5 inches Weight:      166 pounds BMI:     23.57 Temp:     98.4 degrees F Pulse rate:   72 / minute Pulse rhythm:   regular BP sitting:   132 / 84  History of Present Illness: Here for Medicare AWV:  1.   Risk factors based on Past M, S, F history:see list. 2.   Physical Activities: he is able to perform all physical activities. He is able exercise. 3.   Depression/mood: no concerns. 4.   Hearing: patient denies. 5.   ADL's: patient able to do all ADLs. 6.   Fall Risk: none noted. 7.   Home Safety: no concerns. 8.   Height, weight, &visual acuity:C. exam. Patient gets annual eye exam. 9.   Counseling: patient encouraged to participate in physical activities. 10.   Labs ordered based on risk factors: see orders. 11.           Referral Coordinationnone needed. 12.           Care Plan  advised to exercise daily. 13.            Cognitive Assessment ;no concerns.  Current Problems:  MELANOMA, ARM (ICD-172.6)---no recurrence, sees dermatology regularly DYSLIPIDEMIA (ICD-272.4)---no Rx at this time HYPERTENSION (ICD-401.9)--tolerating meds, but does admit to a cough occasionally GERD (ICD-530.81)---tolerating meds, and not having any sxs  All other systems reviewed and were negative     Current Problems (verified): 1)  Melanoma, Arm  (ICD-172.6) 2)  Dyslipidemia  (ICD-272.4) 3)  Sleep Apnea, Obstructive  (ICD-327.23) 4)  Hypertension  (ICD-401.9) 5)  Gerd  (ICD-530.81)  Current Medications (verified): 1)  Reglan 10 Mg  Tabs (Metoclopramide Hcl) .... Take 1 Tablet By Mouth Two Times A Day 2)  Protonix 40 Mg Tbec (Pantoprazole Sodium) .... One By Mouth Q Day 3)  Avastin 400 Mg/42ml Soln (Bevacizumab) .... Injection As Directed 4)  Lisinopril 5 Mg Tabs (Lisinopril) .... Take 1 Tablet By Mouth Once A Day  Allergies (verified): No Known Drug Allergies  Past History:  Past Medical History: Last updated:  04/07/2010 GERD Hypertension syncope obstructive sleep apnea dyslipidemia melanoma macular degeneration (Dr. Ashley Royalty)  Past Surgical History: Last updated: 11/20/2009 Gastrectomy, partial (ulcers)  1970's Hemorrhoidectomy melanoma-excisec from left arm 2008 skin surgery--nose Cataract extraction--left 12/10  Family History: Last updated: 09/23/2007 pneumonia  father age 64 CABG  mother stomach surgery--brother breast cancer--sister  Social History: Last updated: 09/26/2007 Married Former Smoker Regular exercise-no  Risk Factors: Exercise: no (09/26/2007)  Risk Factors: Smoking Status: quit > 6 months (04/07/2010)  Physical Exam  General:  well-developed well-nourished male in no acute distress. HEENT exam atraumatic, normocephalic symmetric her muscles are intact. He is wearing glasses. Neck is supple without lymphadenopathy, thyromegaly, jugular venous disease or carotid bruits. Chest clear auscultation without increased work of breathing. Cardiac exam S1-S2 are regular without murmurs or gallop. Abdominal exam active bowel sounds, soft and nontender. Extremities no clubbing cyanosis or edema.   Impression & Recommendations:  Problem # 1:  Preventive Health Care (ICD-V70.0)  Problem # 2:  MELANOMA, ARM (ICD-172.6)  no known recurrence. He sees dermatology every 6 months.  Orders: Venipuncture (16109)  Problem # 3:  DYSLIPIDEMIA (ICD-272.4)  I'll removed from problem list. This is not a concern. Labs Reviewed: SGOT: 29 (10/07/2009)   SGPT: 20 (10/07/2009)   HDL:41.70 (10/07/2009), 42.8 (10/09/2008)  LDL:133 (10/07/2009), 126 (10/09/2008)  Chol:191 (10/07/2009), 184 (10/09/2008)  Trig:81.0 (10/07/2009), 77 (10/09/2008)  Orders: TLB-Lipid Panel (80061-LIPID) TLB-TSH (Thyroid Stimulating Hormone) (84443-TSH)  Problem # 4:  HYPERTENSION (ICD-401.9)  patient does admit to a cough. This is minor. I'll ask him to continue lisinopril for the time being. He'll  come in one month if this persists. His updated medication list for this problem includes:    Lisinopril 5 Mg Tabs (Lisinopril) .Marland Kitchen... Take 1 tablet by mouth once a day  BP today: 132/84 Prior BP: 110/68 (04/07/2010)  Labs Reviewed: K+: 4.1 (11/20/2009) Creat: : 0.8 (11/20/2009)   Chol: 191 (10/07/2009)   HDL: 41.70 (10/07/2009)   LDL: 133 (10/07/2009)   TG: 81.0 (10/07/2009)  Orders: TLB-BMP (Basic Metabolic Panel-BMET) (80048-METABOL)  Complete Medication List: 1)  Reglan 10 Mg Tabs (Metoclopramide hcl) .... Take 1 tablet by mouth two times a day 2)  Protonix 40 Mg Tbec (Pantoprazole sodium) .... One by mouth q day 3)  Avastin 400 Mg/58ml Soln (Bevacizumab) .... Injection as directed 4)  Lisinopril 5 Mg Tabs (Lisinopril) .... Take 1 tablet by mouth once a day  Other Orders: TLB-CBC Platelet - w/Differential (85025-CBCD) TLB-PSA (Prostate Specific Antigen) (84153-PSA)   Orders Added: 1)  Est. Patient 65& > [99397] 2)  Est. Patient Level III [19147] 3)  Venipuncture [36415] 4)  TLB-BMP (Basic Metabolic Panel-BMET) [80048-METABOL] 5)  TLB-CBC Platelet - w/Differential [85025-CBCD] 6)  TLB-Lipid Panel [80061-LIPID] 7)  TLB-TSH (Thyroid Stimulating Hormone) [84443-TSH] 8)  TLB-PSA (Prostate Specific Antigen) [82956-OZH]     .lbfflu  Appended Document: Orders Update Flu Vaccine Consent Questions     Do you have a history of severe allergic reactions to this vaccine? no    Any prior history of allergic reactions to egg and/or gelatin? no    Do you have a sensitivity to the preservative Thimersol? no    Do you have a past history of Guillan-Barre Syndrome? no    Do you currently have an acute febrile illness? no    Have you ever had a severe reaction to latex? no    Vaccine information given and explained to patient? yes    Are you currently pregnant? no    Lot Number:[id number]   Exp Date:05/16/2011   Site Given  Left Deltoid IM     Clinical Lists  Changes  Medications: Rx of LISINOPRIL 5 MG TABS (LISINOPRIL) Take 1 tablet by mouth once a day;  #30 x 11;  Signed;  Entered by: Alfred Levins, CMA;  Authorized by: Birdie Sons MD;  Method used: Electronically to [id number]*, [address] [place], [place], [place]  [address], Ph: [phone] or [phone], Fax: [phone] Orders: Added new Service order of Flu Vaccine 55yrs + MEDICARE PATIENTS (Z5638) - Signed Added new Service order of Administration Flu vaccine - MCR (V5643) - Signed Added new Service order of Specimen Handling (32951) - Signed Added new Test order of TLB-Lipid Panel (80061-LIPID) - Signed Added new Test order of TLB-BMP (Basic Metabolic Panel-BMET) (80048-METABOL) - Signed Added new Test order of TLB-CBC Platelet - w/Differential (85025-CBCD) - Signed Added new Test order of TLB-TSH (Thyroid Stimulating Hormone) (84443-TSH) - Signed Added new Test order of TLB-PSA (Prostate Specific Antigen) (84153-PSA) - Signed Observations: Added new observation of FLU VAX VIS: 06/10/2010 version (10/01/2010 8:42) Added new observation of FLU VAXLOT: [id number] (10/01/2010 8:42) Added new observation of FLU VAXMFR: [employer] (10/01/2010 8:42) Added new observation of FLU VAX EXP: 05/16/2011 (10/01/2010 8:42)  Added new observation of FLU VAX DSE: 0.10ml (10/01/2010 8:42) Added new observation of FLU VAX: Fluvax 3+ (10/01/2010 8:42)    Prescriptions: LISINOPRIL 5 MG TABS (LISINOPRIL) Take 1 tablet by mouth once a day  #30 x 11   Entered by:   Alfred Levins, CMA   Authorized by:   Birdie Sons MD   Signed by:   Alfred Levins, CMA on 10/01/2010   Method used:   Electronically to        [facility] [phone]* (retail)       [address]       [place], [place]  [address]       Ph: [phone] or [phone]       Fax: [phone]   RxID:   [id number]

## 2010-12-16 NOTE — Assessment & Plan Note (Signed)
Summary: 6 MNTH ROV//SLM   Vital Signs:  Patient profile:   76 year old male Weight:      171 pounds BMI:     24.28 Temp:     97.8 degrees F Pulse rate:   69 / minute Pulse rhythm:   regular Resp:     12 per minute BP sitting:   110 / 68  (left arm) Cuff size:   regular  Vitals Entered By: Gladis Riffle, RN (Apr 07, 2010 8:07 AM) CC: 6 month rov, fasting Is Patient Diabetic? No   CC:  6 month rov and fasting.  History of Present Illness:  Follow-Up Visit      This is a 76 year old man who presents for Follow-up visit.  The patient denies chest pain and palpitations.  Since the last visit the patient notes no new problems or concerns.  The patient reports taking meds as prescribed.  When questioned about possible medication side effects, the patient notes none.    All other systems reviewed and were negative   Preventive Screening-Counseling & Management  Alcohol-Tobacco     Smoking Status: quit > 6 months     Year Started: 1945     Year Quit: 1949  Current Problems (verified): 1)  Melanoma, Arm  (ICD-172.6) 2)  Dyslipidemia  (ICD-272.4) 3)  Sleep Apnea, Obstructive  (ICD-327.23) 4)  Hypertension  (ICD-401.9) 5)  Gerd  (ICD-530.81)  Current Medications (verified): 1)  Reglan 10 Mg  Tabs (Metoclopramide Hcl) .... Take 1 Tablet By Mouth Two Times A Day 2)  Protonix 40 Mg Tbec (Pantoprazole Sodium) .... One By Mouth Q Day 3)  Avastin 400 Mg/12ml Soln (Bevacizumab) .... Injection As Directed 4)  Lisinopril 5 Mg Tabs (Lisinopril) .... Take 1 Tablet By Mouth Once A Day  Allergies (verified): No Known Drug Allergies  Past History:  Past Surgical History: Last updated: 11/20/2009 Gastrectomy, partial (ulcers)  1970's Hemorrhoidectomy melanoma-excisec from left arm 2008 skin surgery--nose Cataract extraction--left 12/10  Family History: Last updated: 09/23/2007 pneumonia  father age 54 CABG  mother stomach surgery--brother breast cancer--sister  Social  History: Last updated: 09/26/2007 Married Former Smoker Regular exercise-no  Risk Factors: Exercise: no (09/26/2007)  Risk Factors: Smoking Status: quit > 6 months (04/07/2010)  Past Medical History: GERD Hypertension syncope obstructive sleep apnea dyslipidemia melanoma macular degeneration (Dr. Ashley Royalty)  Physical Exam  General:  alert and well-developed.   Head:  normocephalic and atraumatic.   Eyes:  pupils equal and pupils round.   Ears:  R ear normal and L ear normal.   Neck:  No deformities, masses, or tenderness noted. Chest Wall:  No deformities, masses, tenderness or gynecomastia noted. Lungs:  Normal respiratory effort, chest expands symmetrically. Lungs are clear to auscultation, no crackles or wheezes. Heart:  normal rate and regular rhythm.   Abdomen:  Bowel sounds positive,abdomen soft and non-tender without masses, organomegaly or hernias noted. Msk:  No deformity or scoliosis noted of thoracic or lumbar spine.   Neurologic:  cranial nerves II-XII intact and gait normal.     Impression & Recommendations:  Problem # 1:  MELANOMA, ARM (ICD-172.6) sees dermatology q 6 months  Problem # 2:  DYSLIPIDEMIA (ICD-272.4) no f/u Labs Reviewed: SGOT: 29 (10/07/2009)   SGPT: 20 (10/07/2009)   HDL:41.70 (10/07/2009), 42.8 (10/09/2008)  LDL:133 (10/07/2009), 126 (10/09/2008)  Chol:191 (10/07/2009), 184 (10/09/2008)  Trig:81.0 (10/07/2009), 77 (10/09/2008)  Problem # 3:  GERD (ICD-530.81) sxs well controlled continue current medications  His updated medication list  for this problem includes:    Protonix 40 Mg Tbec (Pantoprazole sodium) ..... One by mouth q day  Labs Reviewed: Hgb: 17.1 (10/07/2009)   Hct: 50.0 (10/07/2009)  Problem # 4:  HYPERTENSION (ICD-401.9) controlled continue current medications  His updated medication list for this problem includes:    Lisinopril 5 Mg Tabs (Lisinopril) .Marland Kitchen... Take 1 tablet by mouth once a day  BP today:  110/68 Prior BP: 152/80 (11/20/2009)  Labs Reviewed: K+: 4.1 (11/20/2009) Creat: : 0.8 (11/20/2009)   Chol: 191 (10/07/2009)   HDL: 41.70 (10/07/2009)   LDL: 133 (10/07/2009)   TG: 81.0 (10/07/2009)  Complete Medication List: 1)  Reglan 10 Mg Tabs (Metoclopramide hcl) .... Take 1 tablet by mouth two times a day 2)  Protonix 40 Mg Tbec (Pantoprazole sodium) .... One by mouth q day 3)  Avastin 400 Mg/71ml Soln (Bevacizumab) .... Injection as directed 4)  Lisinopril 5 Mg Tabs (Lisinopril) .... Take 1 tablet by mouth once a day  Patient Instructions: 1)  Please schedule a follow-up appointment in 6 months.

## 2011-01-28 LAB — POCT CARDIAC MARKERS
CKMB, poc: 1.1 ng/mL (ref 1.0–8.0)
Myoglobin, poc: 59.7 ng/mL (ref 12–200)
Troponin i, poc: <0.05 ng/mL (ref 0.00–0.09)

## 2011-01-28 LAB — CBC
HCT: 46.1 % (ref 39.0–52.0)
Hemoglobin: 16.2 g/dL (ref 13.0–17.0)
MCH: 32.2 pg (ref 26.0–34.0)
MCHC: 35.1 g/dL (ref 30.0–36.0)
MCV: 91.7 fL (ref 78.0–100.0)
Platelets: 131 10*3/uL — ABNORMAL LOW (ref 150–400)
RBC: 5.03 MIL/uL (ref 4.22–5.81)
RDW: 13.2 % (ref 11.5–15.5)
WBC: 5 10*3/uL (ref 4.0–10.5)

## 2011-01-28 LAB — COMPREHENSIVE METABOLIC PANEL
ALT: 14 U/L (ref 0–53)
AST: 22 U/L (ref 0–37)
Albumin: 4.1 g/dL (ref 3.5–5.2)
Alkaline Phosphatase: 75 U/L (ref 39–117)
BUN: 6 mg/dL (ref 6–23)
CO2: 31 mEq/L (ref 19–32)
Calcium: 9.2 mg/dL (ref 8.4–10.5)
Chloride: 104 mEq/L (ref 96–112)
Creatinine, Ser: 0.8 mg/dL (ref 0.4–1.5)
GFR calc Af Amer: >60 mL/min (ref >60)
GFR calc non Af Amer: >60 mL/min (ref >60)
Glucose, Bld: 96 mg/dL (ref 70–99)
Potassium: 3.5 mEq/L (ref 3.5–5.1)
Sodium: 141 mEq/L (ref 135–145)
Total Bilirubin: 1.4 mg/dL — ABNORMAL HIGH (ref 0.3–1.2)
Total Protein: 6.8 g/dL (ref 6.0–8.3)

## 2011-01-28 LAB — DIFFERENTIAL
Basophils Absolute: 0 10*3/uL (ref 0.0–0.1)
Basophils Relative: 0 % (ref 0–1)
Eosinophils Absolute: 0.3 10*3/uL (ref 0.0–0.7)
Eosinophils Relative: 6 % — ABNORMAL HIGH (ref 0–5)
Lymphocytes Relative: 23 % (ref 12–46)
Lymphs Abs: 1.1 10*3/uL (ref 0.7–4.0)
Monocytes Absolute: 0.4 10*3/uL (ref 0.1–1.0)
Monocytes Relative: 7 % (ref 3–12)
Neutro Abs: 3.2 10*3/uL (ref 1.7–7.7)
Neutrophils Relative %: 64 % (ref 43–77)

## 2011-01-28 LAB — LIPASE, BLOOD: Lipase: 28 U/L (ref 11–59)

## 2011-02-17 ENCOUNTER — Telehealth: Payer: Self-pay | Admitting: *Deleted

## 2011-02-17 NOTE — Telephone Encounter (Signed)
Pt is concerned about his BP. Running 166/92. States he is on Zestril 5 mg one po daily.

## 2011-02-17 NOTE — Telephone Encounter (Signed)
Increase zestril to 20 mg po qd. Make sure he has OV with me in the next 6 weeks

## 2011-02-17 NOTE — Telephone Encounter (Signed)
Pt notified of Dr. Sword's recommendations. 

## 2011-02-18 ENCOUNTER — Other Ambulatory Visit: Payer: Self-pay | Admitting: *Deleted

## 2011-02-18 MED ORDER — LISINOPRIL 20 MG PO TABS: 20.0000 mg | ORAL_TABLET | Freq: Every day | ORAL | Status: DC

## 2011-02-18 NOTE — Telephone Encounter (Signed)
Pt needs refill on Zestril .  Dosage was changed from 5 mg. To 20 mg. Daily.

## 2011-03-31 NOTE — Op Note (Signed)
NAME:  Joseph Simpson, GLOSTER NO.:  [id number]   MEDICAL RECORD NO.:  [id number]          PATIENT TYPE:  AMB   LOCATION:  [facility]                          FACILITY:  [facility]   PHYSICIAN:  Gabrielle Dare. Janee Morn, M.D.DATE OF BIRTH:  25-Nov-1934   DATE OF PROCEDURE:  05/05/2007  DATE OF DISCHARGE:                               OPERATIVE REPORT   PREOPERATIVE DIAGNOSIS:  Melanoma in situ, left forearm.   POSTOPERATIVE DIAGNOSIS:  Melanoma in situ, left forearm.   OPERATION/PROCEDURE:  Wide excision melanoma in situ, left forearm.   SURGEON:  Gabrielle Dare. Janee Morn, M.D.   ANESTHESIA:  MAC.   HISTORY OF PRESENT ILLNESS:  Joseph Simpson  is a 76 year old gentleman who  I evaluated last week in the office for a melanoma in situ in his left  forearm.  He presents today for elective wide excision.   PROCEDURE IN DETAIL:  Informed consent was obtained.  The patient's site  was marked.  He received intravenous antibiotics.  He was brought to the  operating room.  MAC anesthesia was administered by the anesthesia  staff.  His left arm was prepped and draped in a sterile fashion.  A  mixture of 0.25% Marcaine with epinephrine and 1% lidocaine was injected  around the old biopsy site.  An elliptical incision was then planned,  giving the greater than 5 mm margin circumferentially.  It was  approximately 5 x 2 cm in total.  Elliptical incision was made.  Subcutaneous tissues were dissected down to the underlying fascia and  the ellipse was excised in one piece.  It was oriented with sutures for  pathology.  Meticulous hemostasis was obtained.  The wound was irrigated  and then was closed with a two layers.  First subcutaneous tissues were  approximated with interrupted 3-0 Vicryl sutures.  The skin was closed  with a running 4-0 Monocryl subcuticular stitch.  Sponge, needle and  instrument counts were correct.  Benzoin, Steri-Strips and sterile  dressings were applied.  The patient tolerated  procedure well without  apparent complication.  Was taken recovery in stable condition.  The  wound came together without significant tension.      Gabrielle Dare Janee Morn, M.D.  Electronically Signed     BET/MEDQ  D:  05/06/2007  T:  05/06/2007  Job:  [id number]   cc:   Christianne Dolin, MD

## 2011-04-01 ENCOUNTER — Encounter: Payer: Self-pay | Admitting: Internal Medicine

## 2011-04-03 ENCOUNTER — Ambulatory Visit (INDEPENDENT_AMBULATORY_CARE_PROVIDER_SITE_OTHER): Payer: Medicare Other | Admitting: Internal Medicine

## 2011-04-03 ENCOUNTER — Encounter: Payer: Self-pay | Admitting: Internal Medicine

## 2011-04-03 VITALS — BP 124/80 | HR 60 | Temp 98.1°F | Wt 169.0 lb

## 2011-04-03 DIAGNOSIS — I10 Essential (primary) hypertension: Secondary | ICD-10-CM

## 2011-04-03 DIAGNOSIS — K219 Gastro-esophageal reflux disease without esophagitis: Secondary | ICD-10-CM

## 2011-04-03 LAB — LIPID PANEL
Cholesterol: 164 mg/dL (ref 0–200)
HDL: 40.6 mg/dL (ref >39.00)
LDL Cholesterol: 109 mg/dL — ABNORMAL HIGH (ref 0–99)
Total CHOL/HDL Ratio: 4
Triglycerides: 70 mg/dL (ref 0.0–149.0)
VLDL: 14 mg/dL (ref 0.0–40.0)

## 2011-04-03 LAB — BASIC METABOLIC PANEL
BUN: 13 mg/dL (ref 6–23)
CO2: 31 mEq/L (ref 19–32)
Calcium: 9.2 mg/dL (ref 8.4–10.5)
Chloride: 103 mEq/L (ref 96–112)
Creatinine, Ser: 0.8 mg/dL (ref 0.4–1.5)
GFR: 101.8 mL/min (ref >60.00)
Glucose, Bld: 81 mg/dL (ref 70–99)
Potassium: 3.6 mEq/L (ref 3.5–5.1)
Sodium: 141 mEq/L (ref 135–145)

## 2011-04-03 MED ORDER — LISINOPRIL 40 MG PO TABS: 40.0000 mg | ORAL_TABLET | Freq: Every day | ORAL | Status: DC

## 2011-04-03 NOTE — Assessment & Plan Note (Signed)
Controlled on no meds

## 2011-04-03 NOTE — Assessment & Plan Note (Signed)
Pt describes DM retinopathy by ophthalmology--- Will treat bp aggressively Will increase lisinopril to 40 mg po qd  Side effects discussed See me 6 weeks

## 2011-04-03 NOTE — Progress Notes (Signed)
  Subjective:    Patient ID: Joseph Simpson, male    DOB: 19-Dec-1934, 76 y.o.   MRN: [id number]  HPI Patient Active Problem List  Diagnoses  . MELANOMA, ARM---has regular f/u with dermatology  . SLEEP APNEA, OBSTRUCTIVE  . HYPERTENSION---recent bps in the 118-140/68-72. He has seen ophthalmolgy---dr matthews. Told needs better control of BP  . GERD--well controlled    Past Medical History  Diagnosis Date  . GERD (gastroesophageal reflux disease)   . Hypertension   . Syncope   . OSA (obstructive sleep apnea)   . Melanoma   . Dyslipidemia   . Macular degeneration    Past Surgical History  Procedure Date  . Gastrectomy   . Hemorrhoid surgery   . Melanoma excision   . Nose surgery   . Cataract extraction     reports that he has quit smoking. He does not have any smokeless tobacco history on file. His alcohol and drug histories not on file. family history includes Cancer in his sister; Heart disease in his mother; and Pneumonia in his father. No Known Allergies    Review of Systems  patient denies chest pain, shortness of breath, orthopnea. Denies lower extremity edema, abdominal pain, change in appetite, change in bowel movements. Patient denies rashes, musculoskeletal complaints. No other specific complaints in a complete review of systems.      Objective:   Physical Exam  well-developed well-nourished male in no acute distress. HEENT exam atraumatic, normocephalic, neck supple without jugular venous distention. Chest clear to auscultation cardiac exam S1-S2 are regular. Abdominal exam overweight with bowel sounds, soft and nontender. Extremities no edema. Neurologic exam is alert with a normal gait.        Assessment & Plan:

## 2011-04-28 ENCOUNTER — Ambulatory Visit (INDEPENDENT_AMBULATORY_CARE_PROVIDER_SITE_OTHER): Payer: Medicare Other | Admitting: Internal Medicine

## 2011-04-28 DIAGNOSIS — Z2911 Encounter for prophylactic immunotherapy for respiratory syncytial virus (RSV): Secondary | ICD-10-CM

## 2011-04-28 DIAGNOSIS — Z23 Encounter for immunization: Secondary | ICD-10-CM

## 2011-05-15 ENCOUNTER — Ambulatory Visit (INDEPENDENT_AMBULATORY_CARE_PROVIDER_SITE_OTHER): Payer: Medicare Other | Admitting: Internal Medicine

## 2011-05-15 ENCOUNTER — Encounter: Payer: Self-pay | Admitting: Internal Medicine

## 2011-05-15 DIAGNOSIS — I10 Essential (primary) hypertension: Secondary | ICD-10-CM

## 2011-05-15 NOTE — Assessment & Plan Note (Signed)
Well controlled. Continue current medications  

## 2011-05-15 NOTE — Progress Notes (Signed)
  Subjective:    Patient ID: Joseph Simpson, male    DOB: 1935-03-28, 76 y.o.   MRN: [id number]  HPI  Patient comes in for followup of high blood pressure. Lisinopril increased last visit. He does not monitor his blood pressure at home. We are being cautious with his blood pressure because apparently he has a retinal bleed that his ophthalmologist thinks is related to hypertension. Patient has not had any side effects on increased dose of lisinopril.  Past Medical History  Diagnosis Date  . GERD (gastroesophageal reflux disease)   . Hypertension   . Syncope   . OSA (obstructive sleep apnea)   . Melanoma   . Dyslipidemia   . Macular degeneration    Past Surgical History  Procedure Date  . Gastrectomy   . Hemorrhoid surgery   . Melanoma excision   . Nose surgery   . Cataract extraction     reports that he has quit smoking. He does not have any smokeless tobacco history on file. His alcohol and drug histories not on file. family history includes Cancer in his sister; Heart disease in his mother; and Pneumonia in his father. No Known Allergies   Review of Systems     patient denies chest pain, shortness of breath, orthopnea. Denies lower extremity edema, abdominal pain, change in appetite, change in bowel movements. Patient denies rashes, musculoskeletal complaints. No other specific complaints in a complete review of systems.   Objective:   Physical Exam Well-developed male in no acute distress. HEENT exam atraumatic, normocephalic, extraocular muscles are intact. Conjunctivae are pink without exudate. Neck is supple without lymphadenopathy, thyromegaly, jugular venous distention. Chest is clear to auscultation without increased work of breathing. Cardiac exam S1-S2 are regular. The PMI is normal. No significant murmurs or gallops. Abdominal exam active bowel sounds, soft, nontender. No abdominal bruits. Extremities no clubbing cyanosis or edema. Peripheral pulses are normal without  bruits. Neurologic exam alert and oriented without any motor or sensory deficits. Rectal exam normal tone prostate normal size without masses or asymmetry.       Assessment & Plan:

## 2011-07-22 ENCOUNTER — Encounter: Payer: Self-pay | Admitting: Internal Medicine

## 2011-07-22 ENCOUNTER — Ambulatory Visit (INDEPENDENT_AMBULATORY_CARE_PROVIDER_SITE_OTHER): Payer: Medicare Other | Admitting: Internal Medicine

## 2011-07-22 VITALS — BP 124/70 | Temp 98.1°F | Ht 70.5 in | Wt 168.0 lb

## 2011-07-22 DIAGNOSIS — H532 Diplopia: Secondary | ICD-10-CM | POA: Insufficient documentation

## 2011-07-22 NOTE — Assessment & Plan Note (Signed)
Acute onset with headache and unsteady gait Sent for CT head

## 2011-07-22 NOTE — Progress Notes (Signed)
  Subjective:    Patient ID: Joseph Simpson, male    DOB: 1935-04-09, 76 y.o.   MRN: [id number]  HPI 6 day history of feeling poorly Started with headache, significant blurred vision (couldn't read the paper) and has had a persistent problem of not being able to walk in a straight line. No trauma, no fall. He does have macular degeneration and is receiving injections to Left eye every 10 weeks. Last injection was 9 weeks ago.   Past Medical History  Diagnosis Date  . GERD (gastroesophageal reflux disease)   . Hypertension   . Syncope   . OSA (obstructive sleep apnea)   . Melanoma   . Dyslipidemia   . Macular degeneration    Past Surgical History  Procedure Date  . Gastrectomy   . Hemorrhoid surgery   . Melanoma excision   . Nose surgery   . Cataract extraction     reports that he has quit smoking. He does not have any smokeless tobacco history on file. His alcohol and drug histories not on file. family history includes Cancer in his sister; Heart disease in his mother; and Pneumonia in his father. No Known Allergies  Review of Systems  patient denies chest pain, shortness of breath, orthopnea. Denies lower extremity edema, abdominal pain, change in appetite, change in bowel movements. Patient denies rashes, musculoskeletal complaints. No other specific complaints in a complete review of systems.      Objective:   Physical Exam  well-developed well-nourished male in no acute distress. HEENT exam atraumatic, normocephalic, neck supple without jugular venous distention. Chest clear to auscultation cardiac exam S1-S2 are regular. Abdominal exam overweight with bowel sounds, soft and nontender. Extremities no edema. Neurologic exam is alert with a normal gait.        Assessment & Plan:

## 2011-07-23 ENCOUNTER — Ambulatory Visit (INDEPENDENT_AMBULATORY_CARE_PROVIDER_SITE_OTHER)
Admission: RE | Admit: 2011-07-23 | Discharge: 2011-07-23 | Disposition: A | Discharge status: Home / Self Care | Payer: Medicare Other | Source: Ambulatory Visit | Attending: Internal Medicine | Admitting: Internal Medicine

## 2011-07-23 DIAGNOSIS — H532 Diplopia: Secondary | ICD-10-CM

## 2011-07-28 ENCOUNTER — Encounter (INDEPENDENT_AMBULATORY_CARE_PROVIDER_SITE_OTHER): Payer: Medicare Other | Admitting: Ophthalmology

## 2011-07-28 DIAGNOSIS — H43819 Vitreous degeneration, unspecified eye: Secondary | ICD-10-CM

## 2011-07-28 DIAGNOSIS — H3530 Unspecified macular degeneration: Secondary | ICD-10-CM

## 2011-07-28 DIAGNOSIS — H353290 Exudative age-related macular degeneration, unspecified eye, stage unspecified: Secondary | ICD-10-CM

## 2011-08-23 ENCOUNTER — Emergency Department (HOSPITAL_COMMUNITY): Payer: Medicare Other

## 2011-08-23 ENCOUNTER — Emergency Department (HOSPITAL_COMMUNITY)
Admission: EM | Admit: 2011-08-23 | Discharge: 2011-08-23 | Disposition: A | Discharge status: Home / Self Care | Payer: Medicare Other | Attending: Emergency Medicine | Admitting: Emergency Medicine

## 2011-08-23 DIAGNOSIS — N1330 Unspecified hydronephrosis: Secondary | ICD-10-CM | POA: Insufficient documentation

## 2011-08-23 DIAGNOSIS — R109 Unspecified abdominal pain: Secondary | ICD-10-CM | POA: Insufficient documentation

## 2011-08-23 DIAGNOSIS — N200 Calculus of kidney: Secondary | ICD-10-CM | POA: Insufficient documentation

## 2011-08-23 DIAGNOSIS — Z79899 Other long term (current) drug therapy: Secondary | ICD-10-CM | POA: Insufficient documentation

## 2011-08-23 DIAGNOSIS — N201 Calculus of ureter: Secondary | ICD-10-CM | POA: Insufficient documentation

## 2011-08-23 DIAGNOSIS — R112 Nausea with vomiting, unspecified: Secondary | ICD-10-CM | POA: Insufficient documentation

## 2011-08-23 DIAGNOSIS — I10 Essential (primary) hypertension: Secondary | ICD-10-CM | POA: Insufficient documentation

## 2011-08-23 LAB — DIFFERENTIAL
Basophils Absolute: 0 10*3/uL (ref 0.0–0.1)
Basophils Relative: 0 % (ref 0–1)
Eosinophils Absolute: 0.1 10*3/uL (ref 0.0–0.7)
Eosinophils Relative: 2 % (ref 0–5)
Lymphocytes Relative: 18 % (ref 12–46)
Lymphs Abs: 1.2 10*3/uL (ref 0.7–4.0)
Monocytes Absolute: 0.6 10*3/uL (ref 0.1–1.0)
Monocytes Relative: 9 % (ref 3–12)
Neutro Abs: 4.8 10*3/uL (ref 1.7–7.7)
Neutrophils Relative %: 71 % (ref 43–77)

## 2011-08-23 LAB — CBC
HCT: 45.3 % (ref 39.0–52.0)
Hemoglobin: 15.6 g/dL (ref 13.0–17.0)
MCH: 31.5 pg (ref 26.0–34.0)
MCHC: 34.4 g/dL (ref 30.0–36.0)
MCV: 91.3 fL (ref 78.0–100.0)
Platelets: 127 10*3/uL — ABNORMAL LOW (ref 150–400)
RBC: 4.96 MIL/uL (ref 4.22–5.81)
RDW: 12.8 % (ref 11.5–15.5)
WBC: 6.8 10*3/uL (ref 4.0–10.5)

## 2011-08-23 LAB — POCT I-STAT, CHEM 8
BUN: 12 mg/dL (ref 6–23)
Calcium, Ion: 1.16 mmol/L (ref 1.12–1.32)
Chloride: 101 mEq/L (ref 96–112)
Creatinine, Ser: 1.2 mg/dL (ref 0.50–1.35)
Glucose, Bld: 121 mg/dL — ABNORMAL HIGH (ref 70–99)
HCT: 48 % (ref 39.0–52.0)
Hemoglobin: 16.3 g/dL (ref 13.0–17.0)
Potassium: 3.4 mEq/L — ABNORMAL LOW (ref 3.5–5.1)
Sodium: 145 mEq/L (ref 135–145)
TCO2: 29 mmol/L (ref 0–100)

## 2011-08-24 ENCOUNTER — Emergency Department (HOSPITAL_COMMUNITY)
Admission: EM | Admit: 2011-08-24 | Discharge: 2011-08-24 | Disposition: A | Discharge status: Home / Self Care | Payer: Medicare Other | Attending: Emergency Medicine | Admitting: Emergency Medicine

## 2011-08-24 DIAGNOSIS — I10 Essential (primary) hypertension: Secondary | ICD-10-CM | POA: Insufficient documentation

## 2011-08-24 DIAGNOSIS — R112 Nausea with vomiting, unspecified: Secondary | ICD-10-CM | POA: Insufficient documentation

## 2011-08-24 DIAGNOSIS — R109 Unspecified abdominal pain: Secondary | ICD-10-CM | POA: Insufficient documentation

## 2011-08-24 DIAGNOSIS — N200 Calculus of kidney: Secondary | ICD-10-CM | POA: Insufficient documentation

## 2011-09-02 LAB — POCT HEMOGLOBIN-HEMACUE
Hemoglobin: 17.5 — ABNORMAL HIGH
Operator id: 208731

## 2011-09-02 LAB — BASIC METABOLIC PANEL
BUN: 13
CO2: 28
Calcium: 9.3
Chloride: 101
Creatinine, Ser: 0.71
GFR calc Af Amer: >60
GFR calc non Af Amer: >60
Glucose, Bld: 104 — ABNORMAL HIGH
Potassium: 3.6
Sodium: 139

## 2011-09-04 ENCOUNTER — Ambulatory Visit (INDEPENDENT_AMBULATORY_CARE_PROVIDER_SITE_OTHER): Payer: Medicare Other | Admitting: Internal Medicine

## 2011-09-04 ENCOUNTER — Encounter: Payer: Self-pay | Admitting: Internal Medicine

## 2011-09-04 VITALS — BP 134/78 | HR 68 | Temp 98.2°F | Ht 73.0 in | Wt 169.0 lb

## 2011-09-04 DIAGNOSIS — Z23 Encounter for immunization: Secondary | ICD-10-CM

## 2011-09-04 DIAGNOSIS — I10 Essential (primary) hypertension: Secondary | ICD-10-CM

## 2011-09-04 DIAGNOSIS — K219 Gastro-esophageal reflux disease without esophagitis: Secondary | ICD-10-CM

## 2011-09-04 DIAGNOSIS — N200 Calculus of kidney: Secondary | ICD-10-CM

## 2011-09-04 NOTE — Assessment & Plan Note (Signed)
Adequate control Continue meds 

## 2011-09-04 NOTE — Assessment & Plan Note (Signed)
No sxs on meds 

## 2011-09-04 NOTE — Assessment & Plan Note (Signed)
Followed by urology ([place]). Has had two recent exacerbations

## 2011-09-06 NOTE — Progress Notes (Signed)
  Subjective:    Patient ID: Joseph Simpson, male    DOB: 05-20-1935, 76 y.o.   MRN: [id number]  HPI  Patient comes in for followup. He has a history of hypertension. History of recently with kidney stones. He also has a history of gastroesophageal reflux disease. He is tolerating his medications without difficulty.  Past Medical History  Diagnosis Date  . GERD (gastroesophageal reflux disease)   . Hypertension   . Syncope   . OSA (obstructive sleep apnea)   . Melanoma   . Dyslipidemia   . Macular degeneration    Past Surgical History  Procedure Date  . Gastrectomy   . Hemorrhoid surgery   . Melanoma excision   . Nose surgery   . Cataract extraction     reports that he quit smoking about 55 years ago. He does not have any smokeless tobacco history on file. His alcohol and drug histories not on file. family history includes Cancer in his sister; Heart disease in his mother; and Pneumonia in his father. No Known Allergies   Review of Systems     patient denies chest pain, shortness of breath, orthopnea. Denies lower extremity edema, abdominal pain, change in appetite, change in bowel movements. Patient denies rashes, musculoskeletal complaints. No other specific complaints in a complete review of systems.   Objective:   Physical Exam   well-developed well-nourished male in no acute distress. HEENT exam atraumatic, normocephalic, neck supple without jugular venous distention. Chest clear to auscultation cardiac exam S1-S2 are regular. Abdominal exam overweight with bowel sounds, soft and nontender. Extremities no edema. Neurologic exam is alert with a normal gait.       Assessment & Plan:

## 2011-10-13 ENCOUNTER — Encounter (INDEPENDENT_AMBULATORY_CARE_PROVIDER_SITE_OTHER): Payer: Medicare Other | Admitting: Ophthalmology

## 2011-10-13 DIAGNOSIS — H35039 Hypertensive retinopathy, unspecified eye: Secondary | ICD-10-CM

## 2011-10-13 DIAGNOSIS — I10 Essential (primary) hypertension: Secondary | ICD-10-CM

## 2011-10-13 DIAGNOSIS — H353290 Exudative age-related macular degeneration, unspecified eye, stage unspecified: Secondary | ICD-10-CM

## 2011-10-13 DIAGNOSIS — H3530 Unspecified macular degeneration: Secondary | ICD-10-CM

## 2011-10-30 ENCOUNTER — Other Ambulatory Visit: Payer: Self-pay | Admitting: Urology

## 2011-11-02 ENCOUNTER — Encounter (HOSPITAL_COMMUNITY): Payer: Self-pay | Admitting: Pharmacy Technician

## 2011-11-11 ENCOUNTER — Encounter (HOSPITAL_COMMUNITY): Payer: Self-pay | Admitting: *Deleted

## 2011-11-16 ENCOUNTER — Encounter (HOSPITAL_COMMUNITY)
Admission: RE | Disposition: A | Discharge status: Home / Self Care | Payer: Self-pay | Source: Ambulatory Visit | Attending: Urology

## 2011-11-16 ENCOUNTER — Ambulatory Visit (HOSPITAL_COMMUNITY)
Admission: RE | Admit: 2011-11-16 | Discharge: 2011-11-16 | Disposition: A | Discharge status: Home / Self Care | Payer: Medicare Other | Source: Ambulatory Visit | Attending: Urology | Admitting: Urology

## 2011-11-16 ENCOUNTER — Encounter (HOSPITAL_COMMUNITY): Payer: Self-pay | Admitting: *Deleted

## 2011-11-16 ENCOUNTER — Ambulatory Visit (HOSPITAL_COMMUNITY): Payer: Medicare Other

## 2011-11-16 DIAGNOSIS — G4733 Obstructive sleep apnea (adult) (pediatric): Secondary | ICD-10-CM | POA: Insufficient documentation

## 2011-11-16 DIAGNOSIS — K219 Gastro-esophageal reflux disease without esophagitis: Secondary | ICD-10-CM | POA: Insufficient documentation

## 2011-11-16 DIAGNOSIS — N200 Calculus of kidney: Secondary | ICD-10-CM

## 2011-11-16 DIAGNOSIS — Z01818 Encounter for other preprocedural examination: Secondary | ICD-10-CM | POA: Insufficient documentation

## 2011-11-16 DIAGNOSIS — I10 Essential (primary) hypertension: Secondary | ICD-10-CM | POA: Insufficient documentation

## 2011-11-16 DIAGNOSIS — E785 Hyperlipidemia, unspecified: Secondary | ICD-10-CM | POA: Insufficient documentation

## 2011-11-16 DIAGNOSIS — N201 Calculus of ureter: Secondary | ICD-10-CM | POA: Insufficient documentation

## 2011-11-16 DIAGNOSIS — Z79899 Other long term (current) drug therapy: Secondary | ICD-10-CM | POA: Insufficient documentation

## 2011-11-16 HISTORY — PX: EXTRACORPOREAL SHOCK WAVE LITHOTRIPSY: SHX1557

## 2011-11-16 SURGERY — LITHOTRIPSY, ESWL
Anesthesia: LOCAL | Laterality: Left

## 2011-11-16 MED ORDER — DIAZEPAM 5 MG PO TABS
10.0000 mg | ORAL_TABLET | ORAL | Status: AC
  Administered 2011-11-16: 10 mg via ORAL

## 2011-11-16 MED ORDER — DIPHENHYDRAMINE HCL 25 MG PO CAPS
25.0000 mg | ORAL_CAPSULE | ORAL | Status: AC
  Administered 2011-11-16: 25 mg via ORAL

## 2011-11-16 MED ORDER — DEXTROSE-NACL 5-0.45 % IV SOLN
INTRAVENOUS | Status: DC
  Administered 2011-11-16: 07:00:00 via INTRAVENOUS

## 2011-11-16 MED ORDER — LEVOFLOXACIN 500 MG PO TABS
500.0000 mg | ORAL_TABLET | ORAL | Status: AC
  Administered 2011-11-16: 500 mg via ORAL
  Filled 2011-11-16: qty 1

## 2011-11-16 NOTE — Progress Notes (Signed)
Patient able to void. VSS. Verbalized understanding of d/c instructions.

## 2011-11-16 NOTE — H&P (Signed)
Urology Admission H&P  Chief Complaint: left sided ureteral stone  History of Present Illness: this 76 year old male presents at this time for definitive management of a 7 mm left distal ureteral stone. He has a prior history of urolithiasis. He presented to the emergency room on 08/24/2011 with left flank pain. Evaluation included a CT scan which revealed the previously mentioned stone. He has been fairly asymptomatic since that time, but has not had significant distal progression of his stone. Because of this, it was recommended that he undergo management of this stone. I discussed lithotripsy and ureteroscopy with laser fragmentation and extraction of stone versus lithotripsy. The patient has had lithotripsy procedures before. He would like to proceed with that. Risks and complications/benefits of the procedure were discussed with the patient. He understands these and desires to proceed.  Past Medical History  Diagnosis Date  . GERD (gastroesophageal reflux disease)   . Hypertension   . Syncope   . OSA (obstructive sleep apnea)   . Melanoma   . Dyslipidemia   . Macular degeneration    Past Surgical History  Procedure Date  . Gastrectomy   . Hemorrhoid surgery   . Melanoma excision   . Nose surgery   . Cataract extraction     Home Medications:  Prescriptions prior to admission  Medication Sig Dispense Refill  . acetaminophen (TYLENOL) 500 MG tablet Take 1,000 mg by mouth every 6 (six) hours as needed. PAIN        . lisinopril (PRINIVIL,ZESTRIL) 40 MG tablet Take 40 mg by mouth every morning.        . metoCLOPramide (REGLAN) 10 MG tablet Take 10 mg by mouth 2 (two) times daily.        . pantoprazole (PROTONIX) 40 MG tablet Take 40 mg by mouth 2 (two) times daily.       Bertram Gala Glycol-Propyl Glycol (SYSTANE ULTRA) 0.4-0.3 % SOLN Apply 1 drop to eye 3 (three) times daily.         Allergies:  Allergies  Allergen Reactions  . Aspirin Other (See Comments)    UPSET STOMACH  .  Percocet (Oxycodone-Acetaminophen) Nausea And Vomiting    Family History  Problem Relation Age of Onset  . Heart disease Mother     CABG  . Pneumonia Father   . Cancer Sister     breast   Social History:  reports that he quit smoking about 55 years ago. He does not have any smokeless tobacco history on file. He reports that he does not drink alcohol or use illicit drugs.  Review of Systems  All other systems reviewed and are negative.    Physical Exam:  Vital signs in last 24 hours: Temp:  [97.8 F (36.6 C)] 97.8 F (36.6 C) (12/31 0639) Pulse Rate:  [72] 72  (12/31 0639) Resp:  [18] 18  (12/31 0639) BP: (133)/(84) 133/84 mmHg (12/31 0639) SpO2:  [98 %] 98 % (12/31 0639) Weight:  [75.807 kg (167 lb 2 oz)] 167 lb 2 oz (75.807 kg) (12/31 1610) Physical Exam  Constitutional: He is oriented to person, place, and time. He appears well-developed and well-nourished.  HENT:  Head: Normocephalic and atraumatic.  Eyes: Pupils are equal, round, and reactive to light.  Neck: Normal range of motion. Neck supple.  Cardiovascular: Normal rate and normal heart sounds.   Respiratory: Effort normal and breath sounds normal.  GI: Soft. Bowel sounds are normal.  Musculoskeletal: Normal range of motion.  Neurological: He is alert and oriented  to person, place, and time.  Skin: Skin is warm and dry.  Psychiatric: He has a normal mood and affect. His behavior is normal.    Laboratory Data:  No results found for this or any previous visit (from the past 24 hour(s)). No results found for this or any previous visit (from the past 240 hour(s)). Creatinine: No results found for this basename: CREATININE:7 in the last 168 hours Baseline Creatinine:   Impression/Assessment:  Persistent left distal ureteral stone, 7 mm in size Plan:  Extracorporeal shockwave lithotripsy  Marcine Matar M 11/16/2011, 7:21 AM

## 2011-11-16 NOTE — Progress Notes (Signed)
States compliant with laxative and no aspirin and ibuprofen .

## 2011-11-16 NOTE — Op Note (Signed)
See [facility] OP note scanned into chart. 

## 2011-11-17 DIAGNOSIS — H3530 Unspecified macular degeneration: Secondary | ICD-10-CM | POA: Insufficient documentation

## 2011-11-30 DIAGNOSIS — M999 Biomechanical lesion, unspecified: Secondary | ICD-10-CM | POA: Diagnosis not present

## 2011-11-30 DIAGNOSIS — M5137 Other intervertebral disc degeneration, lumbosacral region: Secondary | ICD-10-CM | POA: Diagnosis not present

## 2011-11-30 DIAGNOSIS — M5430 Sciatica, unspecified side: Secondary | ICD-10-CM | POA: Diagnosis not present

## 2011-12-11 ENCOUNTER — Other Ambulatory Visit: Payer: Self-pay | Admitting: Dermatology

## 2011-12-11 DIAGNOSIS — Z85828 Personal history of other malignant neoplasm of skin: Secondary | ICD-10-CM | POA: Diagnosis not present

## 2011-12-11 DIAGNOSIS — Z85820 Personal history of malignant melanoma of skin: Secondary | ICD-10-CM | POA: Diagnosis not present

## 2011-12-11 DIAGNOSIS — L570 Actinic keratosis: Secondary | ICD-10-CM | POA: Diagnosis not present

## 2011-12-11 DIAGNOSIS — D0439 Carcinoma in situ of skin of other parts of face: Secondary | ICD-10-CM | POA: Diagnosis not present

## 2011-12-11 DIAGNOSIS — D485 Neoplasm of uncertain behavior of skin: Secondary | ICD-10-CM | POA: Diagnosis not present

## 2011-12-11 DIAGNOSIS — D0430 Carcinoma in situ of skin of unspecified part of face: Secondary | ICD-10-CM | POA: Diagnosis not present

## 2011-12-14 DIAGNOSIS — N201 Calculus of ureter: Secondary | ICD-10-CM | POA: Diagnosis not present

## 2011-12-14 DIAGNOSIS — N210 Calculus in bladder: Secondary | ICD-10-CM | POA: Diagnosis not present

## 2011-12-16 DIAGNOSIS — R1031 Right lower quadrant pain: Secondary | ICD-10-CM | POA: Diagnosis not present

## 2011-12-21 DIAGNOSIS — N210 Calculus in bladder: Secondary | ICD-10-CM | POA: Diagnosis not present

## 2011-12-21 DIAGNOSIS — K5730 Diverticulosis of large intestine without perforation or abscess without bleeding: Secondary | ICD-10-CM | POA: Diagnosis not present

## 2011-12-29 ENCOUNTER — Encounter (INDEPENDENT_AMBULATORY_CARE_PROVIDER_SITE_OTHER): Payer: Medicare Other | Admitting: Ophthalmology

## 2011-12-29 DIAGNOSIS — H353290 Exudative age-related macular degeneration, unspecified eye, stage unspecified: Secondary | ICD-10-CM | POA: Diagnosis not present

## 2011-12-29 DIAGNOSIS — H3530 Unspecified macular degeneration: Secondary | ICD-10-CM | POA: Diagnosis not present

## 2011-12-31 ENCOUNTER — Other Ambulatory Visit: Payer: Self-pay | Admitting: Dermatology

## 2011-12-31 DIAGNOSIS — D0439 Carcinoma in situ of skin of other parts of face: Secondary | ICD-10-CM | POA: Diagnosis not present

## 2011-12-31 DIAGNOSIS — D0430 Carcinoma in situ of skin of unspecified part of face: Secondary | ICD-10-CM | POA: Diagnosis not present

## 2012-01-21 DIAGNOSIS — N138 Other obstructive and reflux uropathy: Secondary | ICD-10-CM | POA: Diagnosis not present

## 2012-01-21 DIAGNOSIS — N201 Calculus of ureter: Secondary | ICD-10-CM | POA: Diagnosis not present

## 2012-01-21 DIAGNOSIS — N401 Enlarged prostate with lower urinary tract symptoms: Secondary | ICD-10-CM | POA: Diagnosis not present

## 2012-01-28 ENCOUNTER — Encounter: Payer: Self-pay | Admitting: Family Medicine

## 2012-01-28 ENCOUNTER — Ambulatory Visit (INDEPENDENT_AMBULATORY_CARE_PROVIDER_SITE_OTHER): Payer: Medicare Other | Admitting: Family Medicine

## 2012-01-28 VITALS — BP 160/90 | Temp 97.4°F | Wt 174.0 lb

## 2012-01-28 DIAGNOSIS — R55 Syncope and collapse: Secondary | ICD-10-CM | POA: Diagnosis not present

## 2012-01-28 DIAGNOSIS — Z01818 Encounter for other preprocedural examination: Secondary | ICD-10-CM | POA: Diagnosis not present

## 2012-01-28 DIAGNOSIS — I10 Essential (primary) hypertension: Secondary | ICD-10-CM | POA: Diagnosis not present

## 2012-01-28 NOTE — Patient Instructions (Signed)
We will call you regarding appointment with cardiology.

## 2012-01-28 NOTE — Progress Notes (Signed)
Subjective:    Patient ID: Joseph Simpson, male    DOB: 1935-04-19, 77 y.o.   MRN: [id number]  HPI  Patient seen for preop medical clearance. He  Has history of recurrent kidney stones. Is being scheduled for a kidney stone extraction. He relates several years ago (about 10 or 11 per patient) he had syncopal episode x2. Apparently underwent extensive workup at that time with cardiologist with no clear etiology. He's had several episodes over the past few weeks of dizziness and lightheadedness and near syncope. These episodes usually occur with walking. No chest pain. No dyspnea. He has palpitations off and on with sensation of occasional skipped beat and possible heart rate slowing followed by speeding up. He has not actually taken his pulse.  Patient had echocardiogram 2010 with mild LVH with normal ejection fraction.    Chronic problems include history of hypertension, GERD, skin cancer, obstructive sleep apnea, and recurrent kidney stones. Also has history of BPH on Flomax  Past Medical History  Diagnosis Date  . GERD (gastroesophageal reflux disease)   . Hypertension   . Syncope   . OSA (obstructive sleep apnea)   . Melanoma   . Dyslipidemia   . Macular degeneration    Past Surgical History  Procedure Date  . Gastrectomy   . Hemorrhoid surgery   . Melanoma excision   . Nose surgery   . Cataract extraction     reports that he quit smoking about 55 years ago. He does not have any smokeless tobacco history on file. He reports that he does not drink alcohol or use illicit drugs. family history includes Cancer in his sister; Heart disease in his mother; and Pneumonia in his father. Allergies  Allergen Reactions  . Aspirin Other (See Comments)    UPSET STOMACH  . Percocet (Oxycodone-Acetaminophen) Nausea And Vomiting      Review of Systems  Constitutional: Negative for fever, chills, appetite change, fatigue and unexpected weight change.  Respiratory: Negative for shortness of  breath.   Cardiovascular: Negative for chest pain, palpitations and leg swelling.  Gastrointestinal: Negative for nausea, vomiting, abdominal pain and blood in stool.  Genitourinary: Negative for dysuria.  Neurological: Positive for dizziness. Negative for seizures, syncope, weakness, numbness and headaches.  Hematological: Negative for adenopathy.  Psychiatric/Behavioral: Negative for confusion.       Objective:   Physical Exam  Constitutional: He is oriented to person, place, and time. He appears well-developed and well-nourished.  HENT:  Mouth/Throat: Oropharynx is clear and moist.  Neck: Neck supple. No thyromegaly present.  Cardiovascular: Normal rate and regular rhythm.  Exam reveals no gallop.   Murmur heard. Pulmonary/Chest: Effort normal and breath sounds normal. No respiratory distress. He has no wheezes. He has no rales.  Abdominal: Soft. He exhibits no distension and no mass. There is no tenderness. There is no rebound and no guarding.  Musculoskeletal: He exhibits no edema.  Lymphadenopathy:    He has no cervical adenopathy.  Neurological: He is alert and oriented to person, place, and time. No cranial nerve deficit.  Skin: No rash noted.  Psychiatric: He has a normal mood and affect. His behavior is normal.          Assessment & Plan:  Exercise associated dizziness. No actual syncope. No chest pain. He does not have any orthostatic change with blood pressure today and blood pressure is quite elevated. Blood pressure sitting 178/80 and this decreased to 168/78 with standing.  Obtain EKG. Consider cardiology referral for preop  clearance. EKG shows sinus rhythm with occasional PAC.  Hypertension. Well controlled by home readings but elevated today. Continue close monitoring

## 2012-02-05 ENCOUNTER — Other Ambulatory Visit: Payer: Self-pay | Admitting: Urology

## 2012-02-10 ENCOUNTER — Encounter: Payer: Self-pay | Admitting: Cardiology

## 2012-02-10 ENCOUNTER — Ambulatory Visit (INDEPENDENT_AMBULATORY_CARE_PROVIDER_SITE_OTHER): Payer: Medicare Other | Admitting: Cardiology

## 2012-02-10 VITALS — BP 136/72 | HR 76 | Ht 73.0 in | Wt 173.0 lb

## 2012-02-10 DIAGNOSIS — R011 Cardiac murmur, unspecified: Secondary | ICD-10-CM | POA: Diagnosis not present

## 2012-02-10 DIAGNOSIS — Z01810 Encounter for preprocedural cardiovascular examination: Secondary | ICD-10-CM

## 2012-02-10 DIAGNOSIS — I10 Essential (primary) hypertension: Secondary | ICD-10-CM | POA: Diagnosis not present

## 2012-02-10 NOTE — Assessment & Plan Note (Signed)
Patient has a systolic ejection murmur on examination most likely from aortic sclerosis. S2 is not diminished. Previous echo did not show significant aortic stenosis. No further workup.

## 2012-02-10 NOTE — Patient Instructions (Signed)
Your physician recommends that you schedule a follow-up appointment in: AS NEEDED  

## 2012-02-10 NOTE — Assessment & Plan Note (Signed)
Blood pressure controlled. Continue present medications. 

## 2012-02-10 NOTE — Assessment & Plan Note (Signed)
Patient is scheduled for removal of kidney stones. He has excellent functional capacity walking 2 miles daily over a 30 minute period. His LV function was normal on previous echocardiogram. I do not think further cardiac testing is indicated preoperatively.

## 2012-02-10 NOTE — Progress Notes (Signed)
HPI: 77 year-old male for preoperative evaluation. Patient apparently evaluated for syncope in June of 2010. A monitor showed sinus rhythm with occasional PVCs, rare couplet and 4 beats SVT. Echocardiogram in June of 2010 showed normal LV function and trace aortic insufficiency. The patient is scheduled to have surgery to remove kidney stones. We were asked to evaluate preoperatively. Note he walks 2 miles daily over 30 minutes. He has no dyspnea, chest pain, palpitations, syncope or claudication. He was also noted to have a murmur on examination. Because of the above we were asked to evaluate.  Current Outpatient Prescriptions  Medication Sig Dispense Refill  . acetaminophen (TYLENOL) 500 MG tablet Take 1,000 mg by mouth every 6 (six) hours as needed. PAIN        . bevacizumab (AVASTIN) 2.5 mg/0.1 mL SOLN Injection to eye every 11 weeks      . lisinopril (PRINIVIL,ZESTRIL) 40 MG tablet Take 40 mg by mouth every morning.        . metoCLOPramide (REGLAN) 10 MG tablet Take 10 mg by mouth 2 (two) times daily.        . pantoprazole (PROTONIX) 40 MG tablet Take 40 mg by mouth 2 (two) times daily.       Bertram Gala Glycol-Propyl Glycol (SYSTANE ULTRA) 0.4-0.3 % SOLN Apply 1 drop to eye 3 (three) times daily.        . Tamsulosin HCl (FLOMAX) 0.4 MG CAPS Take 0.4 mg by mouth as needed.       Marland Kitchen DISCONTD: lisinopril (PRINIVIL,ZESTRIL) 40 MG tablet Take 1 tablet (40 mg total) by mouth daily.  90 tablet  3    Allergies  Allergen Reactions  . Aspirin Other (See Comments)    UPSET STOMACH  . Percocet (Oxycodone-Acetaminophen) Nausea And Vomiting    Past Medical History  Diagnosis Date  . GERD (gastroesophageal reflux disease)   . Hypertension   . Syncope   . OSA (obstructive sleep apnea)   . Melanoma   . Dyslipidemia   . Macular degeneration     Past Surgical History  Procedure Date  . Gastrectomy   . Hemorrhoid surgery   . Melanoma excision   . Nose surgery   . Cataract extraction   .  Tonsillectomy   . Appendectomy     History   Social History  . Marital Status: Married    Spouse Name: N/A    Number of Children: 1  . Years of Education: N/A   Occupational History  . Not on file.   Social History Main Topics  . Smoking status: Former Smoker    Quit date: 09/03/1956  . Smokeless tobacco: Not on file  . Alcohol Use: No  . Drug Use: No  . Sexually Active: Yes   Other Topics Concern  . Not on file   Social History Narrative  . No narrative on file    Family History  Problem Relation Age of Onset  . Heart disease Mother     CABG in her 58s  . Pneumonia Father   . Cancer Sister     breast    ROS:  no fevers or chills, productive cough, hemoptysis, dysphasia, odynophagia, melena, hematochezia, dysuria, hematuria, rash, seizure activity, orthopnea, PND, pedal edema, claudication. Remaining systems are negative.  Physical Exam:  Blood pressure 136/72, pulse 76, height 6\' 1"  (1.854 m), weight 173 lb 0.6 oz (78.49 kg).  General:  Well developed/well nourished in NAD Skin warm/dry Patient not depressed No peripheral clubbing Back-normal HEENT-normal/normal  eyelids Neck supple/normal carotid upstroke bilaterally; no bruits; no JVD; no thyromegaly chest - CTA/ normal expansion CV - RRR/normal S1 and S2; no rubs or gallops;  PMI nondisplaced; 2/6 systolic ejection murmur left sternal border. S2 is not diminished. Abdomen -NT/ND, no HSM, no mass, + bowel sounds, no bruit 2+ femoral pulses, no bruits Ext-no edema, chords, 2+ DP Neuro-grossly nonfocal  ECG 01/28/2012-sinus rhythm with occasional PAC.

## 2012-02-16 ENCOUNTER — Encounter (HOSPITAL_BASED_OUTPATIENT_CLINIC_OR_DEPARTMENT_OTHER): Payer: Self-pay | Admitting: *Deleted

## 2012-02-18 ENCOUNTER — Encounter (HOSPITAL_BASED_OUTPATIENT_CLINIC_OR_DEPARTMENT_OTHER): Payer: Self-pay | Admitting: *Deleted

## 2012-02-18 ENCOUNTER — Ambulatory Visit: Payer: Medicare Other | Admitting: Internal Medicine

## 2012-02-18 ENCOUNTER — Ambulatory Visit (INDEPENDENT_AMBULATORY_CARE_PROVIDER_SITE_OTHER): Payer: Medicare Other | Admitting: Internal Medicine

## 2012-02-18 ENCOUNTER — Encounter: Payer: Self-pay | Admitting: Internal Medicine

## 2012-02-18 VITALS — BP 124/66 | HR 96 | Temp 98.1°F | Ht 73.0 in | Wt 175.0 lb

## 2012-02-18 DIAGNOSIS — N200 Calculus of kidney: Secondary | ICD-10-CM | POA: Diagnosis not present

## 2012-02-18 DIAGNOSIS — I10 Essential (primary) hypertension: Secondary | ICD-10-CM | POA: Diagnosis not present

## 2012-02-18 DIAGNOSIS — R011 Cardiac murmur, unspecified: Secondary | ICD-10-CM | POA: Diagnosis not present

## 2012-02-18 NOTE — Assessment & Plan Note (Signed)
Followed by urology Scheduled for procedure later this month

## 2012-02-18 NOTE — Assessment & Plan Note (Signed)
Controlled Continue same meds 

## 2012-02-18 NOTE — Progress Notes (Signed)
Patient ID: Joseph Simpson, male   DOB: 10/06/1935, 77 y.o.   MRN: [id number]  Kidney stone--followed by dr. Luci Bank had lithotripsy and is scheduled for another procedure this month. (has had CV clearance with dr. Jens Som).  htn---tolerating meds  GERD--no sxs  Past Medical History  Diagnosis Date  . GERD (gastroesophageal reflux disease)   . Hypertension   . Syncope   . OSA (obstructive sleep apnea)   . Melanoma   . Dyslipidemia   . Macular degeneration     History   Social History  . Marital Status: Married    Spouse Name: N/A    Number of Children: 1  . Years of Education: N/A   Occupational History  . Not on file.   Social History Main Topics  . Smoking status: Former Smoker    Quit date: 09/03/1956  . Smokeless tobacco: Not on file  . Alcohol Use: No  . Drug Use: No  . Sexually Active: Yes   Other Topics Concern  . Not on file   Social History Narrative  . No narrative on file    Past Surgical History  Procedure Date  . Gastrectomy   . Hemorrhoid surgery   . Melanoma excision 05-05-2007    LEFT FOREARM  . Nose surgery   . Cataract extraction   . Tonsillectomy   . Appendectomy     Family History  Problem Relation Age of Onset  . Heart disease Mother     CABG in her 43s  . Pneumonia Father   . Cancer Sister     breast    Allergies  Allergen Reactions  . Aspirin Other (See Comments)    UPSET STOMACH  . Percocet (Oxycodone-Acetaminophen) Nausea And Vomiting    Current Outpatient Prescriptions on File Prior to Visit  Medication Sig Dispense Refill  . acetaminophen (TYLENOL) 500 MG tablet Take 1,000 mg by mouth every 6 (six) hours as needed. PAIN        . bevacizumab (AVASTIN) 2.5 mg/0.1 mL SOLN Injection to eye every 11 weeks      . lisinopril (PRINIVIL,ZESTRIL) 40 MG tablet Take 40 mg by mouth every morning.        . metoCLOPramide (REGLAN) 10 MG tablet Take 10 mg by mouth 2 (two) times daily.        . pantoprazole (PROTONIX) 40  MG tablet Take 40 mg by mouth 2 (two) times daily.       Bertram Gala Glycol-Propyl Glycol (SYSTANE ULTRA) 0.4-0.3 % SOLN Apply 1 drop to eye 3 (three) times daily.        . Tamsulosin HCl (FLOMAX) 0.4 MG CAPS Take 0.4 mg by mouth as needed.       Marland Kitchen DISCONTD: lisinopril (PRINIVIL,ZESTRIL) 40 MG tablet Take 1 tablet (40 mg total) by mouth daily.  90 tablet  3     patient denies chest pain, shortness of breath, orthopnea. Denies lower extremity edema, abdominal pain, change in appetite, change in bowel movements. Patient denies rashes, musculoskeletal complaints. No other specific complaints in a complete review of systems.   BP 124/66  Pulse 96  Temp(Src) 98.1 F (36.7 C) (Oral)  Ht 6\' 1"  (1.854 m)  Wt 175 lb (79.379 kg)  BMI 23.09 kg/m2 Well-developed male in no acute distress. HEENT exam atraumatic, normocephalic, extraocular muscles are intact. Conjunctivae are pink without exudate. Neck is supple without lymphadenopathy, thyromegaly, jugular venous distention. Chest is clear to auscultation without increased work of breathing. Cardiac exam S1-S2 are  regular. The PMI is normal. No significant murmurs or gallops. Abdominal exam active bowel sounds, soft, nontender.

## 2012-02-18 NOTE — Assessment & Plan Note (Signed)
No need for further evaluation Reviewed previous echo

## 2012-02-18 NOTE — Progress Notes (Signed)
Pt instructed npo p mn 4/9 x protonix and avastin w sip of water.  To [facility] 4/10 @ 0845.  Needs kub, istat on arrival

## 2012-02-19 NOTE — Progress Notes (Signed)
CALLED PT BACK , HE LEFT MESSAGE ON VOICE MAIL. CLARIFIED QUESTION ABOUT BP MED. TAKE LISINOPRIL UP UNTIL Sunday, BUT DO NOT TAKE AM OF SURG., HE CAN START TAKING WHEN HE RETURNS HOME . BUT DO TAKE PROTONIX AM OF SURG W/ SIP OF WATER AND IF NEEDED TYLENOL OK TO TAKE.

## 2012-02-24 ENCOUNTER — Encounter (HOSPITAL_BASED_OUTPATIENT_CLINIC_OR_DEPARTMENT_OTHER): Payer: Self-pay | Admitting: Anesthesiology

## 2012-02-24 ENCOUNTER — Encounter (HOSPITAL_BASED_OUTPATIENT_CLINIC_OR_DEPARTMENT_OTHER)
Admission: RE | Disposition: A | Discharge status: Home / Self Care | Payer: Self-pay | Source: Ambulatory Visit | Attending: Urology

## 2012-02-24 ENCOUNTER — Ambulatory Visit (HOSPITAL_COMMUNITY): Payer: Medicare Other

## 2012-02-24 ENCOUNTER — Ambulatory Visit (HOSPITAL_BASED_OUTPATIENT_CLINIC_OR_DEPARTMENT_OTHER)
Admission: RE | Admit: 2012-02-24 | Discharge: 2012-02-24 | Disposition: A | Discharge status: Home / Self Care | Payer: Medicare Other | Source: Ambulatory Visit | Attending: Urology | Admitting: Urology

## 2012-02-24 ENCOUNTER — Encounter (HOSPITAL_BASED_OUTPATIENT_CLINIC_OR_DEPARTMENT_OTHER): Payer: Self-pay | Admitting: *Deleted

## 2012-02-24 ENCOUNTER — Ambulatory Visit (HOSPITAL_BASED_OUTPATIENT_CLINIC_OR_DEPARTMENT_OTHER): Payer: Medicare Other | Admitting: Anesthesiology

## 2012-02-24 DIAGNOSIS — I10 Essential (primary) hypertension: Secondary | ICD-10-CM | POA: Diagnosis not present

## 2012-02-24 DIAGNOSIS — E785 Hyperlipidemia, unspecified: Secondary | ICD-10-CM | POA: Insufficient documentation

## 2012-02-24 DIAGNOSIS — G4733 Obstructive sleep apnea (adult) (pediatric): Secondary | ICD-10-CM | POA: Diagnosis not present

## 2012-02-24 DIAGNOSIS — K219 Gastro-esophageal reflux disease without esophagitis: Secondary | ICD-10-CM | POA: Insufficient documentation

## 2012-02-24 DIAGNOSIS — N201 Calculus of ureter: Secondary | ICD-10-CM | POA: Diagnosis not present

## 2012-02-24 DIAGNOSIS — N200 Calculus of kidney: Secondary | ICD-10-CM | POA: Diagnosis not present

## 2012-02-24 HISTORY — PX: CYSTOSCOPY/RETROGRADE/URETEROSCOPY/STONE EXTRACTION WITH BASKET: SHX5317

## 2012-02-24 LAB — POCT I-STAT 4, (NA,K, GLUC, HGB,HCT)
Glucose, Bld: 102 mg/dL — ABNORMAL HIGH (ref 70–99)
HCT: 47 % (ref 39.0–52.0)
Hemoglobin: 16 g/dL (ref 13.0–17.0)
Potassium: 3.8 mEq/L (ref 3.5–5.1)
Sodium: 143 mEq/L (ref 135–145)

## 2012-02-24 SURGERY — CYSTOSCOPY, WITH CALCULUS REMOVAL USING BASKET
Anesthesia: General | Laterality: Bilateral | Wound class: Clean Contaminated

## 2012-02-24 MED ORDER — SODIUM CHLORIDE 0.9 % IV SOLN: 250.0000 mL | INTRAVENOUS | Status: DC | PRN

## 2012-02-24 MED ORDER — SODIUM CHLORIDE 0.9 % IJ SOLN: 3.0000 mL | INTRAMUSCULAR | Status: DC | PRN

## 2012-02-24 MED ORDER — ACETAMINOPHEN 650 MG RE SUPP: 650.0000 mg | RECTAL | Status: DC | PRN

## 2012-02-24 MED ORDER — LACTATED RINGERS IV SOLN
INTRAVENOUS | Status: DC
  Administered 2012-02-24: 10:00:00 via INTRAVENOUS

## 2012-02-24 MED ORDER — DEXAMETHASONE SODIUM PHOSPHATE 4 MG/ML IJ SOLN
INTRAMUSCULAR | Status: DC | PRN
  Administered 2012-02-24: 10 mg via INTRAVENOUS

## 2012-02-24 MED ORDER — FENTANYL CITRATE 0.05 MG/ML IJ SOLN: 25.0000 ug | INTRAMUSCULAR | Status: DC | PRN

## 2012-02-24 MED ORDER — LIDOCAINE HCL (CARDIAC) 20 MG/ML IV SOLN
INTRAVENOUS | Status: DC | PRN
  Administered 2012-02-24: 50 mg via INTRAVENOUS

## 2012-02-24 MED ORDER — PROMETHAZINE HCL 25 MG/ML IJ SOLN: 6.2500 mg | INTRAMUSCULAR | Status: DC | PRN

## 2012-02-24 MED ORDER — ONDANSETRON HCL 4 MG/2ML IJ SOLN
INTRAMUSCULAR | Status: DC | PRN
  Administered 2012-02-24: 4 mg via INTRAVENOUS

## 2012-02-24 MED ORDER — PROPOFOL 10 MG/ML IV EMUL
INTRAVENOUS | Status: DC | PRN
  Administered 2012-02-24: 150 mg via INTRAVENOUS
  Administered 2012-02-24: 50 mg via INTRAVENOUS

## 2012-02-24 MED ORDER — EPHEDRINE SULFATE 50 MG/ML IJ SOLN
INTRAMUSCULAR | Status: DC | PRN
  Administered 2012-02-24: 15 mg via INTRAVENOUS

## 2012-02-24 MED ORDER — SODIUM CHLORIDE 0.9 % IR SOLN
Status: DC | PRN
  Administered 2012-02-24: 3000 mL

## 2012-02-24 MED ORDER — IOHEXOL 350 MG/ML SOLN
INTRAVENOUS | Status: DC | PRN
  Administered 2012-02-24: 15 mL via INTRAVENOUS

## 2012-02-24 MED ORDER — MORPHINE SULFATE 2 MG/ML IJ SOLN: 2.0000 mg | INTRAMUSCULAR | Status: DC | PRN

## 2012-02-24 MED ORDER — ACETAMINOPHEN 325 MG PO TABS: 650.0000 mg | ORAL_TABLET | ORAL | Status: DC | PRN

## 2012-02-24 MED ORDER — LACTATED RINGERS IV SOLN: INTRAVENOUS | Status: DC

## 2012-02-24 MED ORDER — OXYCODONE HCL 5 MG PO TABS: 5.0000 mg | ORAL_TABLET | ORAL | Status: DC | PRN

## 2012-02-24 MED ORDER — SUCCINYLCHOLINE CHLORIDE 20 MG/ML IJ SOLN
INTRAMUSCULAR | Status: DC | PRN
  Administered 2012-02-24: 90 mg via INTRAVENOUS

## 2012-02-24 MED ORDER — ONDANSETRON HCL 4 MG/2ML IJ SOLN: 4.0000 mg | Freq: Four times a day (QID) | INTRAMUSCULAR | Status: DC | PRN

## 2012-02-24 MED ORDER — FENTANYL CITRATE 0.05 MG/ML IJ SOLN
INTRAMUSCULAR | Status: DC | PRN
  Administered 2012-02-24: 50 ug via INTRAVENOUS
  Administered 2012-02-24 (×4): 25 ug via INTRAVENOUS

## 2012-02-24 MED ORDER — SULFAMETHOXAZOLE-TRIMETHOPRIM 800-160 MG PO TABS: 1.0000 | ORAL_TABLET | Freq: Two times a day (BID) | ORAL | Status: AC

## 2012-02-24 MED ORDER — STERILE WATER FOR IRRIGATION IR SOLN
Status: DC | PRN
  Administered 2012-02-24: 3000 mL

## 2012-02-24 MED ORDER — CEFAZOLIN SODIUM 1-5 GM-% IV SOLN
1.0000 g | INTRAVENOUS | Status: AC
  Administered 2012-02-24: 1 g via INTRAVENOUS

## 2012-02-24 MED ORDER — KETOROLAC TROMETHAMINE 30 MG/ML IJ SOLN
INTRAMUSCULAR | Status: DC | PRN
  Administered 2012-02-24: 30 mg via INTRAVENOUS

## 2012-02-24 MED ORDER — SODIUM CHLORIDE 0.9 % IJ SOLN: 3.0000 mL | Freq: Two times a day (BID) | INTRAMUSCULAR | Status: DC

## 2012-02-24 SURGICAL SUPPLY — 25 items
ADAPTER CATH URET PLST 4-6FR (CATHETERS) IMPLANT
ADPR CATH URET STRL DISP 4-6FR (CATHETERS)
BAG DRAIN URO-CYSTO SKYTR STRL (DRAIN) ×2 IMPLANT
BAG DRN UROCATH (DRAIN) ×1
BASKET STNLS GEMINI 4WIRE 3FR (BASKET) IMPLANT
BASKET ZERO TIP NITINOL 2.4FR (BASKET) ×1 IMPLANT
BSKT STON RTRVL GEM 120X11 3FR (BASKET)
BSKT STON RTRVL ZERO TP 2.4FR (BASKET) ×1
CANISTER SUCT LVC 12 LTR MEDI- (MISCELLANEOUS) ×2 IMPLANT
CATH INTERMIT  6FR 70CM (CATHETERS) IMPLANT
CLOTH BEACON ORANGE TIMEOUT ST (SAFETY) ×2 IMPLANT
DRAPE CAMERA CLOSED 9X96 (DRAPES) ×2 IMPLANT
GLOVE BIO SURGEON STRL SZ8 (GLOVE) ×2 IMPLANT
GOWN PREVENTION PLUS LG XLONG (DISPOSABLE) ×2 IMPLANT
GOWN STRL REIN XL XLG (GOWN DISPOSABLE) ×2 IMPLANT
GUIDEWIRE 0.038 PTFE COATED (WIRE) IMPLANT
GUIDEWIRE ANG ZIPWIRE 038X150 (WIRE) IMPLANT
GUIDEWIRE STR DUAL SENSOR (WIRE) IMPLANT
IV NS IRRIG 3000ML ARTHROMATIC (IV SOLUTION) ×3 IMPLANT
LASER FIBER DISP (UROLOGICAL SUPPLIES) ×1 IMPLANT
NS IRRIG 500ML POUR BTL (IV SOLUTION) IMPLANT
PACK CYSTOSCOPY (CUSTOM PROCEDURE TRAY) ×2 IMPLANT
SHEATH ACCESS URETERAL 38CM (SHEATH) IMPLANT
STENT URET 6FRX24 CONTOUR (STENTS) ×2 IMPLANT
WATER STERILE IRR 3000ML UROMA (IV SOLUTION) ×1 IMPLANT

## 2012-02-24 NOTE — H&P (Signed)
Urology History and Physical Exam  CC: Bilateral kidney stones  HPI: 77 year old male with a history of urolithiasis. He underwent lithotripsy of a left midureteral stone on 11/16/2011. He had an adequate fragmentation, and has a persistent 6 mm left distal ureteral stone. Additionally, he has a right distal ureteral stone which is symptomatic. He presents at this time for cystoscopy, bilateral ureteroscopy, possible holmium laser and fragmentation/extraction of bilateral distal ureteral calculi.  PMH: Past Medical History  Diagnosis Date  . GERD (gastroesophageal reflux disease)   . Hypertension   . Syncope   . OSA (obstructive sleep apnea)   . Melanoma   . Dyslipidemia   . Macular degeneration     PSH: Past Surgical History  Procedure Date  . Gastrectomy   . Hemorrhoid surgery   . Melanoma excision 05-05-2007    LEFT FOREARM  . Nose surgery   . Cataract extraction   . Tonsillectomy   . Appendectomy     Allergies: Allergies  Allergen Reactions  . Aspirin Other (See Comments)    UPSET STOMACH  . Percocet (Oxycodone-Acetaminophen) Nausea And Vomiting    Medications: No prescriptions prior to admission     Social History: History   Social History  . Marital Status: Married    Spouse Name: N/A    Number of Children: 1  . Years of Education: N/A   Occupational History  . Not on file.   Social History Main Topics  . Smoking status: Former Smoker    Quit date: 09/03/1956  . Smokeless tobacco: Not on file  . Alcohol Use: No  . Drug Use: No  . Sexually Active: Yes   Other Topics Concern  . Not on file   Social History Narrative  . No narrative on file    Family History: Family History  Problem Relation Age of Onset  . Heart disease Mother     CABG in her 72s  . Pneumonia Father   . Cancer Sister     breast    Review of Systems: Positive: Right flank pain Negative: .  A further 10 point review of systems was negative except what is listed  in the HPI.  Physical Exam: @VITALS2 @ General: No acute distress.  Awake. Head:  Normocephalic.  Atraumatic. ENT:  EOMI.  Mucous membranes moist Neck:  Supple.  No lymphadenopathy. CV:  S1 present. S2 present. Regular rate. Pulmonary: Equal effort bilaterally.  Clear to auscultation bilaterally. Abdomen: Soft.  Non- tender to palpation. Skin:  Normal turgor.  No visible rash. Extremity: No gross deformity of bilateral upper extremities.  No gross deformity of    bilateral lower extremities. Neurologic: Alert. Appropriate mood.  Penis:  Normal.  No lesions. Urethra:          Orthotopic meatus. Scrotum: No lesions.  No ecchymosis.  No erythema. Testicles: Descended bilaterally.  No masses bilaterally. Epididymis: Palpable bilaterally.  Non Tender to palpation.  Studies: KUB was performed today. This revealed a 6 mm persistent left distal ureteral stone and a 3 mm right distal ureteral stone  No results found for this basename: HGB:2,WBC:2,PLT:2 in the last 72 hours  No results found for this basename: NA:2,K:2,CL:2,CO2:2,BUN:2,CREATININE:2,CALCIUM:2,MAGNESIUM:2,GFRNONAA:2,GFRAA:2 in the last 72 hours   No results found for this basename: PT:2,INR:2,APTT:2 in the last 72 hours   No components found with this basename: ABG:2    Assessment: Bilateral distal ureteral calculi Plan: Cystoscopy under anesthesia, bilateral retrograde ureteropyelograms, bilateral ureteroscopy with extraction of bilateral ureteral stones with possible  use of holmium laser and possible double-J stent placement. Risks and complications of the procedure have been discussed with the patient. He understands these and desires to proceed.

## 2012-02-24 NOTE — Progress Notes (Signed)
#   18 foley inserted with ease-thick bloody urine returned-few small clots-flushed with 200cc sterile saline-urine clear cherry,no further clots present.Home with foley with instructions to remove in am-

## 2012-02-24 NOTE — Op Note (Signed)
Preoperative diagnosis: Bilateral distal ureteral calculi  Postoperative diagnosis: Same  Principal procedure: Cystoscopy, bilateral retrograde ureteropyelograms, intraoperative fluoroscopy, bilateral ureteroscopy, holmium laser and extraction of left ureteral stone, right ureteroscopy, bilateral double-J stent placement (24 cm x 6 [place] contour with tether)  Surgeon: Brodie Correll  Anesthesia: Gen. with LMA  Specimens: Stones, the patient's wife  Indications-77 year old male with symptomatic left distal ureteral stone, and a recent right distal ureteral stone. He underwent lithotripsy of a left-sided stone in late December, 2012 with inadequate fragmentation. He presents now for definitive management. Risks and complications of the procedure have been discussed with the patient who desires to proceed.  Description of procedure the patient was identified in the holding area and received preoperative IV antibiotics. He was taken to the operating room where general anesthetic was administered with the LMA. He is placed in the dorsolithotomy position. Genitalia and perineum were prepped and draped. Timeout was then performed.  A 22 French panendoscope was advanced and was bladder. Prostate was significant for bilateral hypertrophy with mild obstruction. Bladder was entered and inspected circumferentially. There were mild trabeculations, no tumors or foreign bodies were noted. Both ureteral orifices were normal in configuration and location. Left-sided retrograde was performed. This revealed a filling defect in the distal ureter consistent with a prior diagnosis of stone. A guidewire was placed, and the cystoscope was replaced with ureteroscope which was advanced up to the stone. The stone was fragmented with the holmium laser into probably a dozen pieces, each was extracted and placed in the bladder. No further fragments were seen. Cystoscopically, a 24 cm x 6 French contour stent was placed with the  string brought out through the urethra.  A right retrograde ureteropyelogram was performed. This revealed a possible filling defect in the right distal ureter. No proximal hydronephrosis was noted. Ureteroscope was then recent stone, was noted. The scope was advanced up into the mid ureter with no significant stone seen. At this point, I felt it worthwhile, as he did have a narrowed distal ureter, to place a stent on that side as well. Again, a 24 cm x 6 French contour stent was placed using cystoscopic and fluoroscopic guidance. Following stent placement, curls were seen proximally and distally in both stents. The strings were brought out through the urethra. Stones were irrigated from the bladder, the scope removed and the procedure terminated.  The patient tolerated the procedure well. His awakened and extubated and then taken to the PACU in stable condition. advanced into the ureter. No stones were seen, although one small area of erythema in the distal ureter, possibly consistent with a

## 2012-02-24 NOTE — Anesthesia Preprocedure Evaluation (Addendum)
Anesthesia Evaluation  Patient identified by MRN, date of birth, ID band Patient awake    Reviewed: Allergy & Precautions, H&P , NPO status , Patient's Chart, lab work & pertinent test results  Airway Mallampati: II TM Distance: >3 FB Neck ROM: Full    Dental  (+) Edentulous Upper and Edentulous Lower   Pulmonary neg pulmonary ROS, sleep apnea ,  breath sounds clear to auscultation  Pulmonary exam normal       Cardiovascular hypertension, Pt. on medications negative cardio ROS  + Valvular Problems/Murmurs Rhythm:Regular Rate:Normal     Neuro/Psych negative neurological ROS  negative psych ROS   GI/Hepatic negative GI ROS, Neg liver ROS, GERD- (Severe GERD)  Medicated and Poorly Controlled,  Endo/Other  negative endocrine ROS  Renal/GU negative Renal ROS  negative genitourinary   Musculoskeletal negative musculoskeletal ROS (+)   Abdominal   Peds  Hematology negative hematology ROS (+)   Anesthesia Other Findings   Reproductive/Obstetrics negative OB ROS                           Anesthesia Physical Anesthesia Plan  ASA: II  Anesthesia Plan: General   Post-op Pain Management:    Induction: Intravenous, Rapid sequence and Cricoid pressure planned  Airway Management Planned: Oral ETT  Additional Equipment:   Intra-op Plan:   Post-operative Plan: Extubation in OR  Informed Consent: I have reviewed the patients History and Physical, chart, labs and discussed the procedure including the risks, benefits and alternatives for the proposed anesthesia with the patient or authorized representative who has indicated his/her understanding and acceptance.   Dental advisory given  Plan Discussed with: CRNA  Anesthesia Plan Comments: (Severe GERD; proceed with GETA, cricoid pressure on induction/RSI)        Anesthesia Quick Evaluation

## 2012-02-24 NOTE — Transfer of Care (Signed)
Immediate Anesthesia Transfer of Care Note  Patient: GRAYCEN SADLON  Procedure(s) Performed: Procedure(s) (LRB): CYSTOSCOPY/RETROGRADE/URETEROSCOPY/STONE EXTRACTION WITH BASKET (Bilateral) HOLMIUM LASER APPLICATION (Bilateral)  Patient Location: Patient transported to PACU with oxygen via face mask at 4 Liters / Min  Anesthesia Type: General  Level of Consciousness: awake and alert   Airway & Oxygen Therapy: Patient Spontanous Breathing and Patient connected to face mask oxygen  Post-op Assessment: Report given to PACU RN and Post -op Vital signs reviewed and stable  Post vital signs: Reviewed and stable  Dentition: Teeth and oropharynx remain in pre-op condition  Complications: No apparent anesthesia complications

## 2012-02-24 NOTE — Anesthesia Procedure Notes (Addendum)
Procedure Name: Intubation Date/Time: 02/24/2012 10:55 AM Performed by: Fran Lowes Pre-anesthesia Checklist: Patient identified, Emergency Drugs available, Suction available and Patient being monitored Patient Re-evaluated:Patient Re-evaluated prior to inductionOxygen Delivery Method: Circle System Utilized Preoxygenation: Pre-oxygenation with 100% oxygen Intubation Type: IV induction Ventilation: Mask ventilation without difficulty Laryngoscope Size: Mac and 4 Grade View: Grade I Tube type: Oral Tube size: 7.0 mm Number of attempts: 1 Airway Equipment and Method: stylet and oral airway Placement Confirmation: ETT inserted through vocal cords under direct vision,  positive ETCO2 and breath sounds checked- equal and bilateral Tube secured with: Tape Dental Injury: Teeth and Oropharynx as per pre-operative assessment

## 2012-02-24 NOTE — Progress Notes (Addendum)
Dr.Dahlstedt paged -unable to void after multiple attempts-passes scant bloody urine-bladder scanner shows 350cc -orders received

## 2012-02-25 ENCOUNTER — Encounter (HOSPITAL_BASED_OUTPATIENT_CLINIC_OR_DEPARTMENT_OTHER): Payer: Self-pay | Admitting: Urology

## 2012-02-25 NOTE — Anesthesia Postprocedure Evaluation (Signed)
Anesthesia Post Note  Patient: Joseph Simpson  Procedure(s) Performed: Procedure(s) (LRB): CYSTOSCOPY/RETROGRADE/URETEROSCOPY/STONE EXTRACTION WITH BASKET (Bilateral) HOLMIUM LASER APPLICATION (Bilateral)  Anesthesia type: General  Patient location: PACU  Post pain: Pain level controlled  Post assessment: Post-op Vital signs reviewed  Last Vitals:  Filed Vitals:   02/24/12 1650  BP: 141/81  Pulse: 73  Temp: 36.3 C  Resp:     Post vital signs: Reviewed  Level of consciousness: sedated  Complications: No apparent anesthesia complications

## 2012-03-01 ENCOUNTER — Encounter (HOSPITAL_BASED_OUTPATIENT_CLINIC_OR_DEPARTMENT_OTHER): Payer: Self-pay

## 2012-03-01 DIAGNOSIS — N200 Calculus of kidney: Secondary | ICD-10-CM | POA: Diagnosis not present

## 2012-03-01 DIAGNOSIS — R350 Frequency of micturition: Secondary | ICD-10-CM | POA: Diagnosis not present

## 2012-03-01 DIAGNOSIS — R82998 Other abnormal findings in urine: Secondary | ICD-10-CM | POA: Diagnosis not present

## 2012-03-02 ENCOUNTER — Other Ambulatory Visit: Payer: Self-pay | Admitting: Dermatology

## 2012-03-02 ENCOUNTER — Ambulatory Visit: Payer: Medicare Other | Admitting: Internal Medicine

## 2012-03-02 DIAGNOSIS — L570 Actinic keratosis: Secondary | ICD-10-CM | POA: Diagnosis not present

## 2012-03-02 DIAGNOSIS — D485 Neoplasm of uncertain behavior of skin: Secondary | ICD-10-CM | POA: Diagnosis not present

## 2012-03-15 ENCOUNTER — Encounter (INDEPENDENT_AMBULATORY_CARE_PROVIDER_SITE_OTHER): Payer: Medicare Other | Admitting: Ophthalmology

## 2012-03-15 DIAGNOSIS — H353290 Exudative age-related macular degeneration, unspecified eye, stage unspecified: Secondary | ICD-10-CM | POA: Diagnosis not present

## 2012-03-15 DIAGNOSIS — H2510 Age-related nuclear cataract, unspecified eye: Secondary | ICD-10-CM

## 2012-03-15 DIAGNOSIS — D3130 Benign neoplasm of unspecified choroid: Secondary | ICD-10-CM

## 2012-03-15 DIAGNOSIS — H43819 Vitreous degeneration, unspecified eye: Secondary | ICD-10-CM

## 2012-03-15 DIAGNOSIS — H35039 Hypertensive retinopathy, unspecified eye: Secondary | ICD-10-CM

## 2012-03-15 DIAGNOSIS — H3530 Unspecified macular degeneration: Secondary | ICD-10-CM

## 2012-03-15 DIAGNOSIS — I10 Essential (primary) hypertension: Secondary | ICD-10-CM | POA: Diagnosis not present

## 2012-03-28 DIAGNOSIS — N201 Calculus of ureter: Secondary | ICD-10-CM | POA: Diagnosis not present

## 2012-03-28 DIAGNOSIS — N210 Calculus in bladder: Secondary | ICD-10-CM | POA: Diagnosis not present

## 2012-03-28 DIAGNOSIS — N200 Calculus of kidney: Secondary | ICD-10-CM | POA: Diagnosis not present

## 2012-04-07 ENCOUNTER — Other Ambulatory Visit: Payer: Self-pay | Admitting: Internal Medicine

## 2012-04-29 DIAGNOSIS — K589 Irritable bowel syndrome without diarrhea: Secondary | ICD-10-CM | POA: Diagnosis not present

## 2012-05-31 ENCOUNTER — Encounter (INDEPENDENT_AMBULATORY_CARE_PROVIDER_SITE_OTHER): Payer: Medicare Other | Admitting: Ophthalmology

## 2012-05-31 DIAGNOSIS — I10 Essential (primary) hypertension: Secondary | ICD-10-CM

## 2012-05-31 DIAGNOSIS — H353290 Exudative age-related macular degeneration, unspecified eye, stage unspecified: Secondary | ICD-10-CM | POA: Diagnosis not present

## 2012-05-31 DIAGNOSIS — H35039 Hypertensive retinopathy, unspecified eye: Secondary | ICD-10-CM | POA: Diagnosis not present

## 2012-05-31 DIAGNOSIS — H3530 Unspecified macular degeneration: Secondary | ICD-10-CM

## 2012-05-31 DIAGNOSIS — H43819 Vitreous degeneration, unspecified eye: Secondary | ICD-10-CM

## 2012-06-08 DIAGNOSIS — Z85828 Personal history of other malignant neoplasm of skin: Secondary | ICD-10-CM | POA: Diagnosis not present

## 2012-06-08 DIAGNOSIS — L570 Actinic keratosis: Secondary | ICD-10-CM | POA: Diagnosis not present

## 2012-06-08 DIAGNOSIS — L821 Other seborrheic keratosis: Secondary | ICD-10-CM | POA: Diagnosis not present

## 2012-06-08 DIAGNOSIS — I781 Nevus, non-neoplastic: Secondary | ICD-10-CM | POA: Diagnosis not present

## 2012-06-08 DIAGNOSIS — Z85820 Personal history of malignant melanoma of skin: Secondary | ICD-10-CM | POA: Diagnosis not present

## 2012-07-12 ENCOUNTER — Encounter (INDEPENDENT_AMBULATORY_CARE_PROVIDER_SITE_OTHER): Payer: Medicare Other | Admitting: Ophthalmology

## 2012-07-12 DIAGNOSIS — H3530 Unspecified macular degeneration: Secondary | ICD-10-CM

## 2012-07-12 DIAGNOSIS — H353290 Exudative age-related macular degeneration, unspecified eye, stage unspecified: Secondary | ICD-10-CM | POA: Diagnosis not present

## 2012-07-12 DIAGNOSIS — H35039 Hypertensive retinopathy, unspecified eye: Secondary | ICD-10-CM

## 2012-07-12 DIAGNOSIS — H43819 Vitreous degeneration, unspecified eye: Secondary | ICD-10-CM

## 2012-07-12 DIAGNOSIS — I10 Essential (primary) hypertension: Secondary | ICD-10-CM

## 2012-08-09 ENCOUNTER — Encounter (INDEPENDENT_AMBULATORY_CARE_PROVIDER_SITE_OTHER): Payer: Medicare Other | Admitting: Ophthalmology

## 2012-08-09 DIAGNOSIS — H35039 Hypertensive retinopathy, unspecified eye: Secondary | ICD-10-CM

## 2012-08-09 DIAGNOSIS — I10 Essential (primary) hypertension: Secondary | ICD-10-CM

## 2012-08-09 DIAGNOSIS — D3130 Benign neoplasm of unspecified choroid: Secondary | ICD-10-CM

## 2012-08-09 DIAGNOSIS — H43819 Vitreous degeneration, unspecified eye: Secondary | ICD-10-CM

## 2012-08-09 DIAGNOSIS — H2510 Age-related nuclear cataract, unspecified eye: Secondary | ICD-10-CM

## 2012-08-09 DIAGNOSIS — H353290 Exudative age-related macular degeneration, unspecified eye, stage unspecified: Secondary | ICD-10-CM

## 2012-08-19 ENCOUNTER — Ambulatory Visit (INDEPENDENT_AMBULATORY_CARE_PROVIDER_SITE_OTHER): Payer: Medicare Other | Admitting: Internal Medicine

## 2012-08-19 ENCOUNTER — Encounter: Payer: Self-pay | Admitting: Internal Medicine

## 2012-08-19 VITALS — BP 132/84 | HR 76 | Temp 98.2°F | Wt 172.0 lb

## 2012-08-19 DIAGNOSIS — Z23 Encounter for immunization: Secondary | ICD-10-CM | POA: Diagnosis not present

## 2012-08-19 DIAGNOSIS — H3530 Unspecified macular degeneration: Secondary | ICD-10-CM | POA: Diagnosis not present

## 2012-08-19 DIAGNOSIS — I10 Essential (primary) hypertension: Secondary | ICD-10-CM | POA: Diagnosis not present

## 2012-08-19 NOTE — Assessment & Plan Note (Signed)
Has f/u with ophthalmology

## 2012-08-19 NOTE — Progress Notes (Signed)
Patient ID: Joseph Simpson, male   DOB: October 26, 1935, 77 y.o.   MRN: [id number] htn-- no sxs and tolerating meds  gerd-- no sxs  Macular degeneration- has regular f/u with ophthalmology  Past Medical History  Diagnosis Date  . GERD (gastroesophageal reflux disease)   . Hypertension   . Syncope   . OSA (obstructive sleep apnea)   . Melanoma   . Dyslipidemia   . Macular degeneration     History   Social History  . Marital Status: Married    Spouse Name: N/A    Number of Children: 1  . Years of Education: N/A   Occupational History  . Not on file.   Social History Main Topics  . Smoking status: Former Smoker    Quit date: 09/03/1956  . Smokeless tobacco: Not on file  . Alcohol Use: No  . Drug Use: No  . Sexually Active: Yes   Other Topics Concern  . Not on file   Social History Narrative  . No narrative on file    Past Surgical History  Procedure Date  . Gastrectomy   . Hemorrhoid surgery   . Melanoma excision 05-05-2007    LEFT FOREARM  . Nose surgery   . Cataract extraction   . Tonsillectomy   . Appendectomy   . Cystoscopy/retrograde/ureteroscopy/stone extraction with basket 02/24/2012    Procedure: CYSTOSCOPY/RETROGRADE/URETEROSCOPY/STONE EXTRACTION WITH BASKET;  Surgeon: Marcine Matar, MD;  Location: [facility];  Service: Urology;  Laterality: Bilateral;  1 HOUR    Family History  Problem Relation Age of Onset  . Heart disease Mother     CABG in her 36s  . Pneumonia Father   . Cancer Sister     breast    Allergies  Allergen Reactions  . Aspirin Other (See Comments)    UPSET STOMACH  . Percocet (Oxycodone-Acetaminophen) Nausea And Vomiting    Current Outpatient Prescriptions on File Prior to Visit  Medication Sig Dispense Refill  . acetaminophen (TYLENOL) 500 MG tablet Take 1,000 mg by mouth every 6 (six) hours as needed. PAIN        . bevacizumab (AVASTIN) 2.5 mg/0.1 mL SOLN Injection to eye every 11 weeks      . lisinopril  (PRINIVIL,ZESTRIL) 40 MG tablet Take 40 mg by mouth every morning.        Marland Kitchen lisinopril (PRINIVIL,ZESTRIL) 40 MG tablet take 1 tablet by mouth once daily  90 tablet  1  . metoCLOPramide (REGLAN) 10 MG tablet Take 10 mg by mouth 2 (two) times daily.        . pantoprazole (PROTONIX) 40 MG tablet Take 40 mg by mouth 2 (two) times daily.       Bertram Gala Glycol-Propyl Glycol (SYSTANE ULTRA) 0.4-0.3 % SOLN Apply 1 drop to eye 3 (three) times daily.        . Tamsulosin HCl (FLOMAX) 0.4 MG CAPS Take 0.4 mg by mouth as needed.          patient denies chest pain, shortness of breath, orthopnea. Denies lower extremity edema, abdominal pain, change in appetite, change in bowel movements. Patient denies rashes, musculoskeletal complaints. No other specific complaints in a complete review of systems.   BP 146/80  Pulse 76  Temp 98.2 F (36.8 C) (Oral)  Wt 172 lb (78.019 kg)  SpO2 98%  well-developed well-nourished male in no acute distress. HEENT exam atraumatic, normocephalic, neck supple without jugular venous distention. Chest clear to auscultation cardiac exam S1-S2 are regular. Abdominal  exam overweight with bowel sounds, soft and nontender. Extremities no edema. Neurologic exam is alert with a normal gait.

## 2012-08-19 NOTE — Assessment & Plan Note (Signed)
Adequate control Continue same meds 

## 2012-08-25 ENCOUNTER — Encounter (INDEPENDENT_AMBULATORY_CARE_PROVIDER_SITE_OTHER): Payer: Medicare Other | Admitting: Ophthalmology

## 2012-08-25 DIAGNOSIS — H35039 Hypertensive retinopathy, unspecified eye: Secondary | ICD-10-CM | POA: Diagnosis not present

## 2012-08-25 DIAGNOSIS — I10 Essential (primary) hypertension: Secondary | ICD-10-CM

## 2012-08-25 DIAGNOSIS — H43819 Vitreous degeneration, unspecified eye: Secondary | ICD-10-CM

## 2012-08-25 DIAGNOSIS — H2510 Age-related nuclear cataract, unspecified eye: Secondary | ICD-10-CM

## 2012-08-25 DIAGNOSIS — H353290 Exudative age-related macular degeneration, unspecified eye, stage unspecified: Secondary | ICD-10-CM | POA: Diagnosis not present

## 2012-08-25 DIAGNOSIS — D3130 Benign neoplasm of unspecified choroid: Secondary | ICD-10-CM | POA: Diagnosis not present

## 2012-09-03 ENCOUNTER — Encounter: Payer: Self-pay | Admitting: Family Medicine

## 2012-09-03 ENCOUNTER — Encounter (HOSPITAL_COMMUNITY): Payer: Self-pay | Admitting: Emergency Medicine

## 2012-09-03 ENCOUNTER — Observation Stay (HOSPITAL_COMMUNITY)
Admission: EM | Admit: 2012-09-03 | Discharge: 2012-09-04 | Disposition: A | Discharge status: Home / Self Care | Payer: Medicare Other | Attending: Internal Medicine | Admitting: Internal Medicine

## 2012-09-03 ENCOUNTER — Ambulatory Visit (INDEPENDENT_AMBULATORY_CARE_PROVIDER_SITE_OTHER): Payer: Medicare Other | Admitting: Family Medicine

## 2012-09-03 ENCOUNTER — Emergency Department (HOSPITAL_COMMUNITY): Payer: Medicare Other

## 2012-09-03 VITALS — BP 164/98 | HR 78 | Temp 98.3°F | Wt 173.0 lb

## 2012-09-03 DIAGNOSIS — R079 Chest pain, unspecified: Secondary | ICD-10-CM | POA: Diagnosis not present

## 2012-09-03 DIAGNOSIS — R011 Cardiac murmur, unspecified: Secondary | ICD-10-CM

## 2012-09-03 DIAGNOSIS — M79609 Pain in unspecified limb: Secondary | ICD-10-CM

## 2012-09-03 DIAGNOSIS — R110 Nausea: Secondary | ICD-10-CM | POA: Diagnosis not present

## 2012-09-03 DIAGNOSIS — N200 Calculus of kidney: Secondary | ICD-10-CM | POA: Diagnosis not present

## 2012-09-03 DIAGNOSIS — E785 Hyperlipidemia, unspecified: Secondary | ICD-10-CM | POA: Diagnosis not present

## 2012-09-03 DIAGNOSIS — G4733 Obstructive sleep apnea (adult) (pediatric): Secondary | ICD-10-CM | POA: Insufficient documentation

## 2012-09-03 DIAGNOSIS — I10 Essential (primary) hypertension: Secondary | ICD-10-CM

## 2012-09-03 DIAGNOSIS — C4360 Malignant melanoma of unspecified upper limb, including shoulder: Secondary | ICD-10-CM

## 2012-09-03 DIAGNOSIS — R42 Dizziness and giddiness: Secondary | ICD-10-CM | POA: Diagnosis not present

## 2012-09-03 DIAGNOSIS — R9431 Abnormal electrocardiogram [ECG] [EKG]: Secondary | ICD-10-CM | POA: Diagnosis not present

## 2012-09-03 DIAGNOSIS — H3530 Unspecified macular degeneration: Secondary | ICD-10-CM | POA: Diagnosis not present

## 2012-09-03 DIAGNOSIS — Z79899 Other long term (current) drug therapy: Secondary | ICD-10-CM | POA: Insufficient documentation

## 2012-09-03 DIAGNOSIS — K219 Gastro-esophageal reflux disease without esophagitis: Secondary | ICD-10-CM | POA: Insufficient documentation

## 2012-09-03 DIAGNOSIS — M79602 Pain in left arm: Secondary | ICD-10-CM

## 2012-09-03 LAB — COMPREHENSIVE METABOLIC PANEL
ALT: 15 U/L (ref 0–53)
AST: 19 U/L (ref 0–37)
Albumin: 3.9 g/dL (ref 3.5–5.2)
Alkaline Phosphatase: 72 U/L (ref 39–117)
BUN: 8 mg/dL (ref 6–23)
CO2: 27 mEq/L (ref 19–32)
Calcium: 9.4 mg/dL (ref 8.4–10.5)
Chloride: 102 mEq/L (ref 96–112)
Creatinine, Ser: 0.74 mg/dL (ref 0.50–1.35)
GFR calc Af Amer: >90 mL/min (ref >90)
GFR calc non Af Amer: 88 mL/min — ABNORMAL LOW (ref >90)
Glucose, Bld: 99 mg/dL (ref 70–99)
Potassium: 4.3 mEq/L (ref 3.5–5.1)
Sodium: 138 mEq/L (ref 135–145)
Total Bilirubin: 1 mg/dL (ref 0.3–1.2)
Total Protein: 6.9 g/dL (ref 6.0–8.3)

## 2012-09-03 LAB — CBC WITH DIFFERENTIAL/PLATELET
Basophils Absolute: 0 10*3/uL (ref 0.0–0.1)
Basophils Relative: 0 % (ref 0–1)
Eosinophils Absolute: 0.1 10*3/uL (ref 0.0–0.7)
Eosinophils Relative: 1 % (ref 0–5)
HCT: 45.2 % (ref 39.0–52.0)
Hemoglobin: 15.7 g/dL (ref 13.0–17.0)
Lymphocytes Relative: 14 % (ref 12–46)
Lymphs Abs: 0.9 10*3/uL (ref 0.7–4.0)
MCH: 31.5 pg (ref 26.0–34.0)
MCHC: 34.7 g/dL (ref 30.0–36.0)
MCV: 90.8 fL (ref 78.0–100.0)
Monocytes Absolute: 0.4 10*3/uL (ref 0.1–1.0)
Monocytes Relative: 6 % (ref 3–12)
Neutro Abs: 5.1 10*3/uL (ref 1.7–7.7)
Neutrophils Relative %: 79 % — ABNORMAL HIGH (ref 43–77)
Platelets: 149 10*3/uL — ABNORMAL LOW (ref 150–400)
RBC: 4.98 MIL/uL (ref 4.22–5.81)
RDW: 12.7 % (ref 11.5–15.5)
WBC: 6.4 10*3/uL (ref 4.0–10.5)

## 2012-09-03 LAB — BASIC METABOLIC PANEL
BUN: 9 mg/dL (ref 6–23)
CO2: 31 mEq/L (ref 19–32)
Calcium: 9.7 mg/dL (ref 8.4–10.5)
Chloride: 97 mEq/L (ref 96–112)
Creatinine, Ser: 0.74 mg/dL (ref 0.50–1.35)
GFR calc Af Amer: >90 mL/min (ref >90)
GFR calc non Af Amer: 88 mL/min — ABNORMAL LOW (ref >90)
Glucose, Bld: 103 mg/dL — ABNORMAL HIGH (ref 70–99)
Potassium: 3.8 mEq/L (ref 3.5–5.1)
Sodium: 136 mEq/L (ref 135–145)

## 2012-09-03 LAB — MAGNESIUM: Magnesium: 2 mg/dL (ref 1.5–2.5)

## 2012-09-03 LAB — CBC
HCT: 47.4 % (ref 39.0–52.0)
Hemoglobin: 16.6 g/dL (ref 13.0–17.0)
MCH: 31.9 pg (ref 26.0–34.0)
MCHC: 35 g/dL (ref 30.0–36.0)
MCV: 91 fL (ref 78.0–100.0)
Platelets: 169 10*3/uL (ref 150–400)
RBC: 5.21 MIL/uL (ref 4.22–5.81)
RDW: 12.6 % (ref 11.5–15.5)
WBC: 6.7 10*3/uL (ref 4.0–10.5)

## 2012-09-03 LAB — POCT I-STAT TROPONIN I: Troponin i, poc: 0 ng/mL (ref 0.00–0.08)

## 2012-09-03 LAB — PROTIME-INR
INR: 1 (ref 0.00–1.49)
Prothrombin Time: 13.1 seconds (ref 11.6–15.2)

## 2012-09-03 LAB — PRO B NATRIURETIC PEPTIDE: Pro B Natriuretic peptide (BNP): 115.2 pg/mL (ref 0–450)

## 2012-09-03 LAB — TROPONIN I
Troponin I: <0.3 ng/mL (ref <0.30)
Troponin I: <0.3 ng/mL (ref <0.30)

## 2012-09-03 LAB — APTT: aPTT: 32 seconds (ref 24–37)

## 2012-09-03 LAB — TSH: TSH: 1.734 u[IU]/mL (ref 0.350–4.500)

## 2012-09-03 MED ORDER — PANTOPRAZOLE SODIUM 40 MG PO TBEC
40.0000 mg | DELAYED_RELEASE_TABLET | Freq: Two times a day (BID) | ORAL | Status: DC
  Administered 2012-09-03 – 2012-09-04 (×2): 40 mg via ORAL
  Filled 2012-09-03 (×3): qty 1

## 2012-09-03 MED ORDER — ASPIRIN EC 81 MG PO TBEC
81.0000 mg | DELAYED_RELEASE_TABLET | Freq: Every day | ORAL | Status: DC
Start: 2012-09-04 — End: 2012-09-04
  Filled 2012-09-03: qty 1

## 2012-09-03 MED ORDER — ONDANSETRON HCL 4 MG/2ML IJ SOLN: 4.0000 mg | Freq: Four times a day (QID) | INTRAMUSCULAR | Status: DC | PRN

## 2012-09-03 MED ORDER — ONDANSETRON HCL 4 MG/2ML IJ SOLN: 4.0000 mg | Freq: Three times a day (TID) | INTRAMUSCULAR | Status: DC | PRN

## 2012-09-03 MED ORDER — POLYVINYL ALCOHOL 1.4 % OP SOLN
1.0000 [drp] | Freq: Three times a day (TID) | OPHTHALMIC | Status: DC
  Administered 2012-09-04: 1 [drp] via OPHTHALMIC
  Filled 2012-09-03: qty 15

## 2012-09-03 MED ORDER — ACETAMINOPHEN 325 MG PO TABS
650.0000 mg | ORAL_TABLET | ORAL | Status: DC | PRN
  Administered 2012-09-03: 650 mg via ORAL
  Filled 2012-09-03: qty 2

## 2012-09-03 MED ORDER — ASPIRIN 81 MG PO CHEW: 324.0000 mg | CHEWABLE_TABLET | ORAL | Status: DC

## 2012-09-03 MED ORDER — NITROGLYCERIN 0.4 MG SL SUBL: 0.4000 mg | SUBLINGUAL_TABLET | SUBLINGUAL | Status: DC | PRN

## 2012-09-03 MED ORDER — ASPIRIN 300 MG RE SUPP: 300.0000 mg | RECTAL | Status: DC

## 2012-09-03 MED ORDER — SODIUM CHLORIDE 0.9 % IV BOLUS (SEPSIS)
1000.0000 mL | Freq: Once | INTRAVENOUS | Status: AC
  Administered 2012-09-03: 1000 mL via INTRAVENOUS

## 2012-09-03 MED ORDER — LISINOPRIL 40 MG PO TABS
40.0000 mg | ORAL_TABLET | Freq: Every day | ORAL | Status: DC
  Administered 2012-09-04: 40 mg via ORAL
  Filled 2012-09-03: qty 1

## 2012-09-03 MED ORDER — POLYETHYL GLYCOL-PROPYL GLYCOL 0.4-0.3 % OP SOLN
1.0000 [drp] | Freq: Three times a day (TID) | OPHTHALMIC | Status: DC
Start: 2012-09-03 — End: 2012-09-03

## 2012-09-03 MED ORDER — METOCLOPRAMIDE HCL 10 MG PO TABS
10.0000 mg | ORAL_TABLET | Freq: Two times a day (BID) | ORAL | Status: DC
  Administered 2012-09-03 – 2012-09-04 (×2): 10 mg via ORAL
  Filled 2012-09-03 (×3): qty 1

## 2012-09-03 NOTE — ED Notes (Signed)
Patient transported to X-ray by stretcher 

## 2012-09-03 NOTE — H&P (Signed)
[facility] History and Physical  Joseph Simpson ZOX:[id number] DOB: 1935/10/29 DOA: 09/03/2012  Referring physician: ER physician PCP: Judie Petit, MD   Chief Complaint: chest pain  HPI:  77 year old male with history of HTN presented with sudden onset retrosternal chest pain started at rest on the morning of the admission. Chest pain was short lasting and has reoccurred few time in a row and was associated with some dizziness but no loss of consciousness. Patient also reported his chest pain radiated to left arm but no sweating, no palpitations and no shortness of breath. No abdominal pian, no nausea or vomiting.  Assessment and Plan:  Principal Problem:  *Chest pain  Likely of musculoskeletal origin and less likely due to ACS  The first set of cardiac enzymes is negative and we will cycle cardiac enzymes for total of 3 sets  Repeat admission EKG  Follow up BMP, CBC and lipid panel  Manson Passey [facility] [phone]  Review of Systems:  Constitutional: Negative for fever, chills and malaise/fatigue. Negative for diaphoresis.  HENT: Negative for hearing loss, ear pain, nosebleeds, congestion, sore throat, neck pain, tinnitus and ear discharge.   Eyes: Negative for blurred vision, double vision, photophobia, pain, discharge and redness.  Respiratory: Negative for cough, hemoptysis, sputum production, shortness of breath, wheezing and stridor.   Cardiovascular: per HPI.  Gastrointestinal: Negative for nausea, vomiting and abdominal pain. Negative for heartburn, constipation, blood in stool and melena.  Genitourinary: Negative for dysuria, urgency, frequency, hematuria and flank pain.  Musculoskeletal: Negative for myalgias, back pain, joint pain and falls.  Skin: Negative for itching and rash.  Neurological: Negative for dizziness and weakness. Negative for tingling, tremors, sensory change, speech change, focal weakness, loss of consciousness and headaches.    Endo/Heme/Allergies: Negative for environmental allergies and polydipsia. Does not bruise/bleed easily.  Psychiatric/Behavioral: Negative for suicidal ideas. The patient is not nervous/anxious.      Past Medical History  Diagnosis Date  . GERD (gastroesophageal reflux disease)   . Hypertension   . Syncope   . OSA (obstructive sleep apnea)   . Melanoma   . Dyslipidemia   . Macular degeneration    Past Surgical History  Procedure Date  . Gastrectomy   . Hemorrhoid surgery   . Melanoma excision 05-05-2007    LEFT FOREARM  . Nose surgery   . Cataract extraction   . Tonsillectomy   . Appendectomy   . Cystoscopy/retrograde/ureteroscopy/stone extraction with basket 02/24/2012    Procedure: CYSTOSCOPY/RETROGRADE/URETEROSCOPY/STONE EXTRACTION WITH BASKET;  Surgeon: Marcine Matar, MD;  Location: [facility];  Service: Urology;  Laterality: Bilateral;  1 HOUR   Social History:  reports that he quit smoking about 56 years ago. He does not have any smokeless tobacco history on file. He reports that he does not drink alcohol or use illicit drugs.  Allergies  Allergen Reactions  . Aspirin Other (See Comments)    UPSET STOMACH  . Percocet (Oxycodone-Acetaminophen) Nausea And Vomiting    Family History: heart disease in parents  Prior to Admission medications   Medication Sig Start Date End Date Taking? Authorizing Provider  acetaminophen (TYLENOL) 500 MG tablet Take 1,000 mg by mouth every 6 (six) hours as needed. PAIN     Yes Historical Provider, MD  bevacizumab (AVASTIN) 2.5 mg/0.1 mL SOLN 2.5 mg. Injection to eye every 11 weeks   Yes Historical Provider, MD  lisinopril (PRINIVIL,ZESTRIL) 40 MG tablet Take 40 mg by mouth every morning.   04/03/11  Yes  Lindley Magnus, MD  metoCLOPramide (REGLAN) 10 MG tablet Take 10 mg by mouth 2 (two) times daily.     Yes Historical Provider, MD  pantoprazole (PROTONIX) 40 MG tablet Take 40 mg by mouth 2 (two) times daily.    Yes  Historical Provider, MD  Polyethyl Glycol-Propyl Glycol (SYSTANE ULTRA) 0.4-0.3 % SOLN Apply 1 drop to eye 3 (three) times daily.     Yes Historical Provider, MD   Physical Exam: Filed Vitals:   09/03/12 1155 09/03/12 1304  BP: 150/81 150/89  Pulse: 69 65  Temp: 97.7 F (36.5 C)   TempSrc: Oral   Resp: 21 12  SpO2: 96% 97%    Physical Exam  Constitutional: Appears well-developed and well-nourished. No distress.  HENT: Normocephalic. External right and left ear normal. Oropharynx is clear and moist.  Eyes: Conjunctivae and EOM are normal. PERRLA, no scleral icterus.  Neck: Normal ROM. Neck supple. No JVD. No tracheal deviation. No thyromegaly.  CVS: RRR, S1/S2 +, appreciated mild murmurs, no gallops, no carotid bruit.  Pulmonary: Effort and breath sounds normal, no stridor, rhonchi, wheezes, rales.  Abdominal: Soft. BS +,  no distension, tenderness, rebound or guarding.  Musculoskeletal: Normal range of motion. No edema and no tenderness.  Lymphadenopathy: No lymphadenopathy noted, cervical, inguinal. Neuro: Alert. Normal reflexes, muscle tone coordination. No cranial nerve deficit. Skin: Skin is warm and dry. No rash noted. Not diaphoretic. No erythema. No pallor.  Psychiatric: Normal mood and affect. Behavior, judgment, thought content normal.   Labs on Admission:  Basic Metabolic Panel:  Lab 09/03/12 9604  NA 136  K 3.8  CL 97  CO2 31  GLUCOSE 103*  BUN 9  CREATININE 0.74  CALCIUM 9.7   CBC:  Lab 09/03/12 1153  WBC 6.7  HGB 16.6  HCT 47.4  MCV 91.0  PLT 169   Radiological Exams on Admission: Dg Chest 2 View 09/03/2012    IMPRESSION:  1.  Mild chronic interstitial accentuation; otherwise unremarkable.   Original Report Authenticated By: Dellia Cloud, M.D.     Code Status: Full Family Communication: Pt at bedside Disposition Plan: Admit for further evaluation  Manson Passey, MD  [facility] Pager [phone]  If 7PM-7AM, please contact  night-coverage [email or web address] Password [email or web address] 09/03/2012, 1:33 PM

## 2012-09-03 NOTE — ED Provider Notes (Signed)
History    77 year old male referred from primary care provider's office for further evaluation. Patient has been having intermittent left arm pain for the past 2 days. No appreciable exacerbating or relieving factors. Feels like a toothache in his left arm which comes and goes. Lasts anywhere from 15 minutes to half an hour and then gradually subsides. Denies chest pain or shortness of breath. Did have some dizziness when he woke up this morning. Describes as a sensation that he may pass out. This is currently improved. Patient had EKG at his primary care physician's office today which showed some changes concerning for ischemia and referred to the emergency room for further evaluation. Patient currently denies any pain anywhere. He last had the arm pain about an hour ago. Previous evaluation by Dr Jens Som, cardiology, pre-operatively before urologic procedure.   CSN: [id number]  Arrival date & time 09/03/12  1138   First MD Initiated Contact with Patient 09/03/12 1151      Chief Complaint  Patient presents with  . Dizziness    (Consider location/radiation/quality/duration/timing/severity/associated sxs/prior treatment) HPI  Past Medical History  Diagnosis Date  . GERD (gastroesophageal reflux disease)   . Hypertension   . Syncope   . OSA (obstructive sleep apnea)   . Melanoma   . Dyslipidemia   . Macular degeneration     Past Surgical History  Procedure Date  . Gastrectomy   . Hemorrhoid surgery   . Melanoma excision 05-05-2007    LEFT FOREARM  . Nose surgery   . Cataract extraction   . Tonsillectomy   . Appendectomy   . Cystoscopy/retrograde/ureteroscopy/stone extraction with basket 02/24/2012    Procedure: CYSTOSCOPY/RETROGRADE/URETEROSCOPY/STONE EXTRACTION WITH BASKET;  Surgeon: Marcine Matar, MD;  Location: [facility];  Service: Urology;  Laterality: Bilateral;  1 HOUR    Family History  Problem Relation Age of Onset  . Heart disease Mother    CABG in her 9s  . Pneumonia Father   . Cancer Sister     breast    History  Substance Use Topics  . Smoking status: Former Smoker    Quit date: 09/03/1956  . Smokeless tobacco: Not on file  . Alcohol Use: No      Review of Systems   Review of symptoms negative unless otherwise noted in HPI.   Allergies  Aspirin and Percocet  Home Medications   Current Outpatient Rx  Name Route Sig Dispense Refill  . ACETAMINOPHEN 500 MG PO TABS Oral Take 1,000 mg by mouth every 6 (six) hours as needed. PAIN      . BEVACIZUMAB INTRAVITREAL INJECTION 2.5 MG/0.5 ML  2.5 mg. Injection to eye every 11 weeks    . LISINOPRIL 40 MG PO TABS Oral Take 40 mg by mouth every morning.      Marland Kitchen METOCLOPRAMIDE HCL 10 MG PO TABS Oral Take 10 mg by mouth 2 (two) times daily.      Marland Kitchen PANTOPRAZOLE SODIUM 40 MG PO TBEC Oral Take 40 mg by mouth 2 (two) times daily.     Marland Kitchen POLYETHYL GLYCOL-PROPYL GLYCOL 0.4-0.3 % OP SOLN Ophthalmic Apply 1 drop to eye 3 (three) times daily.        BP 150/81  Pulse 69  Temp 97.7 F (36.5 C) (Oral)  Resp 21  SpO2 96%  Physical Exam  Nursing note and vitals reviewed. Constitutional: He appears well-developed and well-nourished. No distress.  HENT:  Head: Normocephalic and atraumatic.  Eyes: Conjunctivae normal are normal. Right eye exhibits no  discharge. Left eye exhibits no discharge.  Neck: Neck supple.  Cardiovascular: Normal rate and regular rhythm.  Exam reveals no gallop and no friction rub.   Murmur heard.      Faint systolic ejection murmur. No ectopy appreciated.  Pulmonary/Chest: Effort normal and breath sounds normal. No respiratory distress.  Abdominal: Soft. He exhibits no distension. There is no tenderness.  Musculoskeletal: He exhibits no edema and no tenderness.       Lower extremities symmetric as compared to each other. No calf tenderness. Negative Homan's. No palpable cords.   Neurological: He is alert.  Skin: Skin is warm and dry.  Psychiatric: He  has a normal mood and affect. His behavior is normal. Thought content normal.    ED Course  Procedures (including critical care time)  Labs Reviewed  BASIC METABOLIC PANEL - Abnormal; Notable for the following:    Glucose, Bld 103 (*)     GFR calc non Af Amer 88 (*)     All other components within normal limits  CBC  POCT I-STAT TROPONIN I   Dg Chest 2 View  09/03/2012  *RADIOLOGY REPORT*  Clinical Data: Dizziness.  Nausea.  Left arm pain.  Hypertension.  CHEST - 2 VIEW  Comparison: 08/23/2010  Findings: Chronic mild interstitial accentuation in the lungs. Cardiac and mediastinal contours appear unremarkable.  No pleural effusion observed.  IMPRESSION:  1.  Mild chronic interstitial accentuation; otherwise unremarkable.   Original Report Authenticated By: Dellia Cloud, M.D.    EKG:  Rhythm: normal sinus Rate: 61 Axis: normal Intervals: normal ST segments: scooped appearance of ST segments with some depression inferiorly and laterally  1. Left arm pain   2. Abnormal EKG       MDM  77 year old male referred to the ER for evaluation of left arm pain and abnormal EKG. Patient's EKG does show nonspecific changes. These have been noted on previous EKGs to varying degrees dating back to October 2011. They appear relatively stable by my interpretation. His symptoms of left arm pain is concerning for possible anginal equivalent though. No known CAD but does have some risk factors including hyperlipidemia and HTN. Symptoms concerning enough that will admit for r/o.       Raeford Razor, MD 09/03/12 1308

## 2012-09-03 NOTE — ED Notes (Addendum)
Patient reports around 3:00am woke up felt dizzy, nausea and left arm pain rated at it's worse 5/10. Stated left arm continuously hurting like a tooth ache 2 nights ago and self resolved.  Took bp 184/140 stated  he could hardly walk to the bathroom thought he would  faint or fall had to hold on the wall to ambulate. Now denies pain to left arm. Wife drove to family MD's Dr. Blossom Hoops office due to symptoms.  Sent from Dr. Blossom Hoops office with a dx of diffuse t wave on EKG   & instructed to come to [facility] ED patient  walked to [facility] ED.

## 2012-09-03 NOTE — Progress Notes (Signed)
  Subjective:    Patient ID: Joseph Simpson, male    DOB: May 01, 1935, 77 y.o.   MRN: [id number]  HPI 77 year old male with history of HTN presents for recently elevated BPs.  no history of CAD, CVA.  Woke up this morning with BP 184/140 Felt dizzy. Went to bathroom then back to sleep..Having some mild headache, feels presyncopal. No chest pain. No SOB. No edema.  Some pain in left arm and left mid abdomen in last few days, occurs at same time, lasts few minutes, occurs at rest..goes away then returns in 10 minutes.Marland Kitchen Has happened 4-5 time today. No increase in stress or new meds lately.  Have been taking meds regularly first thing in AM, but has not taken BP med yet this AM.   On 40 mg lisinopril  BP Readings from Last 3 Encounters:  09/03/12 164/98  08/19/12 132/84  02/24/12 141/81       Review of Systems  Constitutional: Positive for fatigue. Negative for fever.  HENT: Negative for ear pain.   Eyes: Negative for pain.  Respiratory: Negative for shortness of breath and wheezing.   Cardiovascular: Negative for chest pain.  Gastrointestinal: Negative for abdominal pain.  Genitourinary: Negative for dysuria.  Skin: Negative for rash.       Objective:   Physical Exam  Constitutional: He is oriented to person, place, and time.       Elderly male in mild distress  Neck: Normal range of motion. Neck supple.  Cardiovascular: Normal rate, regular rhythm and intact distal pulses.  Exam reveals no gallop and no friction rub.   Murmur heard. Pulmonary/Chest: Effort normal and breath sounds normal. He has no wheezes. He has no rales. He exhibits no tenderness.  Abdominal: Soft. Bowel sounds are normal. There is no tenderness. There is no rebound and no guarding.  Neurological: He is alert and oriented to person, place, and time.  Skin: Skin is warm. No rash noted.          Assessment & Plan:

## 2012-09-03 NOTE — Assessment & Plan Note (Signed)
Unclear cause for elevation in noght... Pt needs to take BP med now but did not bring with him.

## 2012-09-03 NOTE — ED Notes (Signed)
Back from xray uneventful transport.

## 2012-09-03 NOTE — Assessment & Plan Note (Signed)
ST depression on EKG, some new changes from 01/2012 EKG but non specific. Given no clear reason for elevation of BP and episodic pain in left arm/abdomen with no other clear explanation.. I will recommend cardiac rule out at ER.

## 2012-09-03 NOTE — Patient Instructions (Signed)
Go to ER across the road for rule out MI. We will call to let them know you are coming.

## 2012-09-04 DIAGNOSIS — R9431 Abnormal electrocardiogram [ECG] [EKG]: Secondary | ICD-10-CM

## 2012-09-04 DIAGNOSIS — K219 Gastro-esophageal reflux disease without esophagitis: Secondary | ICD-10-CM | POA: Diagnosis not present

## 2012-09-04 DIAGNOSIS — R079 Chest pain, unspecified: Secondary | ICD-10-CM | POA: Diagnosis not present

## 2012-09-04 DIAGNOSIS — M79609 Pain in unspecified limb: Secondary | ICD-10-CM | POA: Diagnosis not present

## 2012-09-04 LAB — BASIC METABOLIC PANEL
BUN: 8 mg/dL (ref 6–23)
CO2: 27 mEq/L (ref 19–32)
Calcium: 9 mg/dL (ref 8.4–10.5)
Chloride: 100 mEq/L (ref 96–112)
Creatinine, Ser: 0.77 mg/dL (ref 0.50–1.35)
GFR calc Af Amer: >90 mL/min (ref >90)
GFR calc non Af Amer: 87 mL/min — ABNORMAL LOW (ref >90)
Glucose, Bld: 111 mg/dL — ABNORMAL HIGH (ref 70–99)
Potassium: 3.5 mEq/L (ref 3.5–5.1)
Sodium: 136 mEq/L (ref 135–145)

## 2012-09-04 LAB — LIPID PANEL
Cholesterol: 167 mg/dL (ref 0–200)
HDL: 40 mg/dL (ref >39)
LDL Cholesterol: 107 mg/dL — ABNORMAL HIGH (ref 0–99)
Total CHOL/HDL Ratio: 4.2 RATIO
Triglycerides: 99 mg/dL (ref <150)
VLDL: 20 mg/dL (ref 0–40)

## 2012-09-04 LAB — CBC
HCT: 44.4 % (ref 39.0–52.0)
Hemoglobin: 15.6 g/dL (ref 13.0–17.0)
MCH: 31.9 pg (ref 26.0–34.0)
MCHC: 35.1 g/dL (ref 30.0–36.0)
MCV: 90.8 fL (ref 78.0–100.0)
Platelets: 149 10*3/uL — ABNORMAL LOW (ref 150–400)
RBC: 4.89 MIL/uL (ref 4.22–5.81)
RDW: 12.7 % (ref 11.5–15.5)
WBC: 5.6 10*3/uL (ref 4.0–10.5)

## 2012-09-04 LAB — TROPONIN I: Troponin I: <0.3 ng/mL (ref <0.30)

## 2012-09-04 MED ORDER — TAMSULOSIN HCL 0.4 MG PO CAPS: 0.4000 mg | ORAL_CAPSULE | Freq: Every day | ORAL | Status: DC

## 2012-09-04 NOTE — Discharge Summary (Addendum)
Physician Discharge Summary  Joseph Simpson ZOX:[id number] DOB: June 06, 1935 DOA: 09/03/2012  PCP: Judie Petit, MD  Admit date: 09/03/2012 Discharge date: 09/04/2012  Discharge Diagnoses:  Principal Problem:  *Chest pain   Discharge Condition: medically stable for discharge home today; patient and family   Diet recommendation: as tolerated  History of present illness:  77 year old male with history of HTN presented with sudden onset retrosternal chest pain started at rest on the morning of the admission. Chest pain was short lasting and has reoccurred few time in a row and was associated with some dizziness but no loss of consciousness. Patient also reported his chest pain radiated to left arm but no sweating, no palpitations and no shortness of breath. No abdominal pian, no nausea or vomiting.   Assessment and Plan:   Principal Problem:  *Chest pain  Likely of musculoskeletal origin and less likely due to ACS  The 3 sets of cardiac enzymes are all negative  BNP is WNL; this lab was requested as part of ACS rule out evaluation Electrolytes are within normal limits No further episodes of chest pain or dizziness I have offered physical therapy evaluation but the patient and family has declined this offer reporting that the patient is fine ambulating and they would not be using PT services  Manson Passey  [facility]  [phone]      Discharge Exam: Filed Vitals:   09/04/12 0610  BP: 140/72  Pulse: 55  Temp: 97.6 F (36.4 C)  Resp: 18   Filed Vitals:   09/03/12 1607 09/03/12 2214 09/04/12 0500 09/04/12 0610  BP: 158/78 137/76  140/72  Pulse: 62 65  55  Temp: 98 F (36.7 C) 98.1 F (36.7 C)  97.6 F (36.4 C)  TempSrc: Oral Oral  Oral  Resp: 16 18  18   Height: 6\' 1"  (1.854 m)     Weight: 78.472 kg (173 lb)  76.023 kg (167 lb 9.6 oz)   SpO2: 94% 95%  96%    General: Pt is alert, follows commands appropriately, not in acute distress Cardiovascular: Regular rate and  rhythm, S1/S2 +, no murmurs, no rubs, no gallops Respiratory: Clear to auscultation bilaterally, no wheezing, no crackles, no rhonchi Abdominal: Soft, non tender, non distended, bowel sounds +, no guarding Extremities: no edema, no cyanosis, pulses palpable bilaterally DP and PT Neuro: Grossly nonfocal  Discharge Instructions  Discharge Orders    Future Appointments: Provider: Department: Dept Phone: Center:   09/20/2012 8:15 AM Sherrie George, MD [facility] [phone] None   09/28/2012 8:15 AM Sherrie George, MD [facility] [phone] None   02/17/2013 8:00 AM Lindley Magnus, MD [facility] [phone] [facility]     Future Orders Please Complete By Expires   Diet - low sodium heart healthy      Increase activity slowly      Call MD for:  persistant nausea and vomiting      Call MD for:  severe uncontrolled pain      Call MD for:  difficulty breathing, headache or visual disturbances      Call MD for:  persistant dizziness or light-headedness          Medication List     As of 09/04/2012 10:14 AM    TAKE these medications         acetaminophen 500 MG tablet   Commonly known as: TYLENOL   Take 1,000 mg by mouth every 6 (six) hours as needed.  Flomax 0.4 daily   bevacizumab 2.5 mg/0.1 mL Soln   Commonly known as: AVASTIN   2.5 mg. Injection to eye every 11 weeks      lisinopril 40 MG tablet   Commonly known as: PRINIVIL,ZESTRIL   Take 40 mg by mouth every morning.      metoCLOPramide 10 MG tablet   Commonly known as: REGLAN   Take 10 mg by mouth 2 (two) times daily.      pantoprazole 40 MG tablet   Commonly known as: PROTONIX   Take 40 mg by mouth 2 (two) times daily.      SYSTANE ULTRA 0.4-0.3 % Soln   Generic drug: Polyethyl Glycol-Propyl Glycol   Apply 1 drop to eye 3 (three) times daily.           Follow-up Information    Follow up with Judie Petit, MD. In 3 weeks. (As needed)    Contact information:   [address]  [place]ena Flake [facility] [place] [address] [phone]           The results of significant diagnostics from this hospitalization (including imaging, microbiology, ancillary and laboratory) are listed below for reference.    Significant Diagnostic Studies: Dg Chest 2 View  09/03/2012  *RADIOLOGY REPORT*  Clinical Data: Dizziness.  Nausea.  Left arm pain.  Hypertension.  CHEST - 2 VIEW  Comparison: 08/23/2010  Findings: Chronic mild interstitial accentuation in the lungs. Cardiac and mediastinal contours appear unremarkable.  No pleural effusion observed.  IMPRESSION:  1.  Mild chronic interstitial accentuation; otherwise unremarkable.   Original Report Authenticated By: Dellia Cloud, M.D.     Microbiology: No results found for this or any previous visit (from the past 240 hour(s)).   Labs: Basic Metabolic Panel:  Lab 09/04/12 2725 09/03/12 1415 09/03/12 1153  NA 136 138 136  K 3.5 4.3 3.8  CL 100 102 97  CO2 27 27 31   GLUCOSE 111* 99 103*  BUN 8 8 9   CREATININE 0.77 0.74 0.74  CALCIUM 9.0 9.4 9.7  MG -- 2.0 --  PHOS -- -- --   Liver Function Tests:  Lab 09/03/12 1415  AST 19  ALT 15  ALKPHOS 72  BILITOT 1.0  PROT 6.9  ALBUMIN 3.9   No results found for this basename: LIPASE:5,AMYLASE:5 in the last 168 hours No results found for this basename: AMMONIA:5 in the last 168 hours CBC:  Lab 09/04/12 0130 09/03/12 1415 09/03/12 1153  WBC 5.6 6.4 6.7  NEUTROABS -- 5.1 --  HGB 15.6 15.7 16.6  HCT 44.4 45.2 47.4  MCV 90.8 90.8 91.0  PLT 149* 149* 169   Cardiac Enzymes:  Lab 09/04/12 0130 09/03/12 2012 09/03/12 1415  CKTOTAL -- -- --  CKMB -- -- --  CKMBINDEX -- -- --  TROPONINI <0.30 <0.30 <0.30   BNP: BNP (last 3 results)  Basename 09/03/12 1415  PROBNP 115.2   CBG: No results found for this basename: GLUCAP:5 in the last 168 hours  Time coordinating discharge: Over 30 minutes  Signed:  Manson Passey, MD  [facility] 09/04/2012, 10:14 AM  Pager #:  [phone]

## 2012-09-04 NOTE — Progress Notes (Signed)
UR completed 

## 2012-09-20 ENCOUNTER — Encounter (INDEPENDENT_AMBULATORY_CARE_PROVIDER_SITE_OTHER): Payer: Medicare Other | Admitting: Ophthalmology

## 2012-09-20 DIAGNOSIS — H2510 Age-related nuclear cataract, unspecified eye: Secondary | ICD-10-CM

## 2012-09-20 DIAGNOSIS — H35039 Hypertensive retinopathy, unspecified eye: Secondary | ICD-10-CM | POA: Diagnosis not present

## 2012-09-20 DIAGNOSIS — D3130 Benign neoplasm of unspecified choroid: Secondary | ICD-10-CM

## 2012-09-20 DIAGNOSIS — H353290 Exudative age-related macular degeneration, unspecified eye, stage unspecified: Secondary | ICD-10-CM

## 2012-09-20 DIAGNOSIS — I10 Essential (primary) hypertension: Secondary | ICD-10-CM

## 2012-09-20 DIAGNOSIS — H43819 Vitreous degeneration, unspecified eye: Secondary | ICD-10-CM

## 2012-09-26 ENCOUNTER — Ambulatory Visit (INDEPENDENT_AMBULATORY_CARE_PROVIDER_SITE_OTHER): Payer: Medicare Other | Admitting: Internal Medicine

## 2012-09-26 ENCOUNTER — Encounter: Payer: Self-pay | Admitting: Internal Medicine

## 2012-09-26 VITALS — BP 132/76 | HR 78 | Temp 97.6°F | Resp 15 | Wt 174.0 lb

## 2012-09-26 DIAGNOSIS — I10 Essential (primary) hypertension: Secondary | ICD-10-CM

## 2012-09-26 DIAGNOSIS — R9431 Abnormal electrocardiogram [ECG] [EKG]: Secondary | ICD-10-CM | POA: Diagnosis not present

## 2012-09-26 DIAGNOSIS — R079 Chest pain, unspecified: Secondary | ICD-10-CM | POA: Diagnosis not present

## 2012-09-26 NOTE — Progress Notes (Signed)
Subjective:    Patient ID: Joseph Simpson, male    DOB: 09-15-1935, 77 y.o.   MRN: [id number]  HPI  77 year old patient who was admitted to hospital on October 19 to rule out an acute MI. He presented with accelerated hypertension mild chest discomfort and mainly left arm pain and new EKG findings consistent with subendocardial ischemia. He had a brief hospitalization and cardiac enzymes were unremarkable. Since his discharge 2 weeks ago he has done quite well and has resumed all his usual activities. He has had no exertional chest pain or other symptoms. He feels quite well today and blood pressure readings have been well-controlled. Cardiac risk factors include hypertension remote tobacco use. He has had a partial gastrectomy and is on Protonix for reflux and states that he cannot take aspirin.  Hospital records reviewed  Past Medical History  Diagnosis Date  . GERD (gastroesophageal reflux disease)   . Hypertension   . Syncope   . OSA (obstructive sleep apnea)   . Melanoma   . Dyslipidemia   . Macular degeneration     History   Social History  . Marital Status: Married    Spouse Name: N/A    Number of Children: 1  . Years of Education: N/A   Occupational History  . Not on file.   Social History Main Topics  . Smoking status: Former Smoker    Quit date: 09/03/1956  . Smokeless tobacco: Not on file  . Alcohol Use: No  . Drug Use: No  . Sexually Active: Yes   Other Topics Concern  . Not on file   Social History Narrative  . No narrative on file    Past Surgical History  Procedure Date  . Gastrectomy   . Hemorrhoid surgery   . Melanoma excision 05-05-2007    LEFT FOREARM  . Nose surgery   . Cataract extraction   . Tonsillectomy   . Appendectomy   . Cystoscopy/retrograde/ureteroscopy/stone extraction with basket 02/24/2012    Procedure: CYSTOSCOPY/RETROGRADE/URETEROSCOPY/STONE EXTRACTION WITH BASKET;  Surgeon: Marcine Matar, MD;  Location: [facility];  Service: Urology;  Laterality: Bilateral;  1 HOUR    Family History  Problem Relation Age of Onset  . Heart disease Mother     CABG in her 45s  . Pneumonia Father   . Cancer Sister     breast    Allergies  Allergen Reactions  . Aspirin Other (See Comments)    UPSET STOMACH  . Percocet (Oxycodone-Acetaminophen) Nausea And Vomiting    Current Outpatient Prescriptions on File Prior to Visit  Medication Sig Dispense Refill  . acetaminophen (TYLENOL) 500 MG tablet Take 1,000 mg by mouth every 6 (six) hours as needed. PAIN        . bevacizumab (AVASTIN) 2.5 mg/0.1 mL SOLN 2.5 mg. Injection to eye every 11 weeks      . lisinopril (PRINIVIL,ZESTRIL) 40 MG tablet Take 40 mg by mouth every morning.        . metoCLOPramide (REGLAN) 10 MG tablet Take 10 mg by mouth 2 (two) times daily.        . pantoprazole (PROTONIX) 40 MG tablet Take 40 mg by mouth 2 (two) times daily.       Bertram Gala Glycol-Propyl Glycol (SYSTANE ULTRA) 0.4-0.3 % SOLN Apply 1 drop to eye 3 (three) times daily.        . Tamsulosin HCl (FLOMAX) 0.4 MG CAPS Take 1 capsule (0.4 mg total) by mouth daily.  30 capsule  0    BP 132/76  Pulse 78  Temp 97.6 F (36.4 C) (Oral)  Resp 15  Wt 174 lb (78.926 kg)     Review of Systems  Constitutional: Negative for fever, chills, appetite change and fatigue.  HENT: Negative for hearing loss, ear pain, congestion, sore throat, trouble swallowing, neck stiffness, dental problem, voice change and tinnitus.   Eyes: Negative for pain, discharge and visual disturbance.  Respiratory: Negative for cough, chest tightness, wheezing and stridor.   Cardiovascular: Negative for chest pain, palpitations and leg swelling.  Gastrointestinal: Negative for nausea, vomiting, abdominal pain, diarrhea, constipation, blood in stool and abdominal distention.  Genitourinary: Negative for urgency, hematuria, flank pain, discharge, difficulty urinating and genital sores.  Musculoskeletal:  Negative for myalgias, back pain, joint swelling, arthralgias and gait problem.  Skin: Negative for rash.  Neurological: Negative for dizziness, syncope, speech difficulty, weakness, numbness and headaches.  Hematological: Negative for adenopathy. Does not bruise/bleed easily.  Psychiatric/Behavioral: Negative for behavioral problems and dysphoric mood. The patient is not nervous/anxious.        Objective:   Physical Exam  Constitutional: He is oriented to person, place, and time. He appears well-developed.  HENT:  Head: Normocephalic.  Right Ear: External ear normal.  Left Ear: External ear normal.  Eyes: Conjunctivae normal and EOM are normal.  Neck: Normal range of motion.  Cardiovascular: Normal rate and normal heart sounds.   Pulmonary/Chest: Breath sounds normal.  Abdominal: Bowel sounds are normal.  Musculoskeletal: Normal range of motion. He exhibits no edema and no tenderness.  Neurological: He is alert and oriented to person, place, and time.  Psychiatric: He has a normal mood and affect. His behavior is normal.          Assessment & Plan:   HTN- well controlled H/o CP/arm pain and abn EKG  Options discussed- will proceed  with stress test for further risk stratification

## 2012-09-26 NOTE — Patient Instructions (Signed)
Stress test as discussed  Limit your sodium (Salt) intake  Please check your blood pressure on a regular basis.  If it is consistently greater than 150/90, please make an office appointment.  Return in the spring for your annual exam

## 2012-09-28 ENCOUNTER — Encounter (INDEPENDENT_AMBULATORY_CARE_PROVIDER_SITE_OTHER): Payer: Medicare Other | Admitting: Ophthalmology

## 2012-09-28 DIAGNOSIS — H3530 Unspecified macular degeneration: Secondary | ICD-10-CM | POA: Diagnosis not present

## 2012-09-28 DIAGNOSIS — H35039 Hypertensive retinopathy, unspecified eye: Secondary | ICD-10-CM | POA: Diagnosis not present

## 2012-09-28 DIAGNOSIS — I10 Essential (primary) hypertension: Secondary | ICD-10-CM

## 2012-09-28 DIAGNOSIS — H2510 Age-related nuclear cataract, unspecified eye: Secondary | ICD-10-CM

## 2012-09-28 DIAGNOSIS — H353290 Exudative age-related macular degeneration, unspecified eye, stage unspecified: Secondary | ICD-10-CM

## 2012-09-28 DIAGNOSIS — H43819 Vitreous degeneration, unspecified eye: Secondary | ICD-10-CM

## 2012-09-28 DIAGNOSIS — D3130 Benign neoplasm of unspecified choroid: Secondary | ICD-10-CM

## 2012-10-05 ENCOUNTER — Ambulatory Visit (HOSPITAL_COMMUNITY): Payer: Medicare Other | Attending: Internal Medicine | Admitting: Radiology

## 2012-10-05 VITALS — BP 139/74 | HR 60 | Ht 73.0 in | Wt 171.0 lb

## 2012-10-05 DIAGNOSIS — R002 Palpitations: Secondary | ICD-10-CM | POA: Diagnosis not present

## 2012-10-05 DIAGNOSIS — R000 Tachycardia, unspecified: Secondary | ICD-10-CM | POA: Diagnosis not present

## 2012-10-05 DIAGNOSIS — R42 Dizziness and giddiness: Secondary | ICD-10-CM | POA: Insufficient documentation

## 2012-10-05 DIAGNOSIS — R55 Syncope and collapse: Secondary | ICD-10-CM | POA: Diagnosis not present

## 2012-10-05 DIAGNOSIS — R9431 Abnormal electrocardiogram [ECG] [EKG]: Secondary | ICD-10-CM | POA: Insufficient documentation

## 2012-10-05 DIAGNOSIS — R079 Chest pain, unspecified: Secondary | ICD-10-CM | POA: Diagnosis not present

## 2012-10-05 MED ORDER — TECHNETIUM TC 99M SESTAMIBI GENERIC - CARDIOLITE
33.0000 | Freq: Once | INTRAVENOUS | Status: AC | PRN
  Administered 2012-10-05: 33 via INTRAVENOUS

## 2012-10-05 MED ORDER — TECHNETIUM TC 99M SESTAMIBI GENERIC - CARDIOLITE
11.0000 | Freq: Once | INTRAVENOUS | Status: AC | PRN
  Administered 2012-10-05: 11 via INTRAVENOUS

## 2012-10-05 NOTE — Progress Notes (Signed)
[facility] SITE 3 NUCLEAR MED [id number] [place] [address] [phone]  Cardiology Nuclear Med Study  Joseph Simpson is a 77 y.o. male     MRN : [id number]     DOB: 12/06/1934  Procedure Date: 10/05/2012  Nuclear Med Background Indication for Stress Test:  Evaluation for Ischemia and Patient seen in hospital on 09/03/12 for Left Arm Pain with  Abnormal EKG and Negative Enzymes  History:  ~12 yrs ago GXT/Cath @ [facility] per patient; '10 Echo:EF=60% Cardiac Risk Factors: Family History - CAD, History of Smoking, Hypertension and Lipids  Symptoms:  Left Arm Pain (none since discharge); Dizziness, Near Syncope, Palpitations and Rapid Heart Rate with Lying on Either Side   Nuclear Pre-Procedure Caffeine/Decaff Intake:  None > 12 hrs NPO After: 5:00pm   Lungs:  Clear. O2 Sat: 96% on room air. IV 0.9% NS with Angio Cath:  20g  IV Site: R Antecubital x 1, tolerated well IV Started by:  Irean Hong, RN  Chest Size (in):  42 Cup Size: n/a  Height: 6\' 1"  (1.854 m)  Weight:  171 lb (77.565 kg)  BMI:  Body mass index is 22.56 kg/(m^2). Tech Comments:  Lisinopril taken this am    Nuclear Med Study 1 or 2 day study: 1 day  Stress Test Type:  Stress  Reading MD: Olga Millers, MD  Order Authorizing Provider:  Eleonore Chiquito, MD  Resting Radionuclide: Technetium 21m Sestamibi  Resting Radionuclide Dose: 11.0 mCi   Stress Radionuclide:  Technetium 39m Sestamibi  Stress Radionuclide Dose: 33.0 mCi           Stress Protocol Rest HR: 60 Stress HR: 141  Rest BP: 139/74 Stress BP: 215/91  Exercise Time (min): 6:01 METS: 7.0   Predicted Max HR: 145 bpm % Max HR: 97.24 bpm Rate Pressure Product: 24401   Dose of Adenosine (mg):  n/a Dose of Lexiscan: n/a mg  Dose of Atropine (mg): n/a Dose of Dobutamine: n/a mcg/kg/min (at max HR)  Stress Test Technologist: Smiley Houseman, CMA-N  Nuclear Technologist:  Domenic Polite, CNMT     Rest Procedure:   Myocardial perfusion imaging was performed at rest 45 minutes following the intravenous administration of Technetium 88m Sestamibi.  Rest ECG: Nonspecific ST-T wave changes with occasional PVC's and couplets.  Stress Procedure:  The patient exercised on the treadmill utilizing the Bruce protocol for 6:01 minutes. He then stopped due to fatigue and denied any chest pain.  There were marked ST-T wave changes with frequent PVC's and brief runs of PSVT.  He did have a hypertensive response to exercise, 215/91.  Technetium 24m Sestamibi was injected at peak exercise and myocardial perfusion imaging was performed after a brief delay.  EKG's and images were discussed with Dr. Shirlee Latch and a follow up with one of our cardiologist, Dr. Peter [place]az[place] was made for the patient on Thursday, 10/06/12 at 4:15 pm.  Stress ECG: Significant ST abnormalities consistent with ischemia.  QPS Raw Data Images:  Acquisition technically good; normal left ventricular size. Stress Images:  Normal homogeneous uptake in all areas of the myocardium. Rest Images:  Normal homogeneous uptake in all areas of the myocardium. Subtraction (SDS):  No evidence of ischemia. Transient Ischemic Dilatation (Normal <1.22):  0.91 Lung/Heart Ratio (Normal <0.45):  0.23  Quantitative Gated Spect Images QGS EDV:  83 ml QGS ESV:  35 ml  Impression Exercise Capacity:  Fair exercise capacity. BP Response:  Hypertensive blood pressure response. Clinical Symptoms:  No chest pain or dyspnea. ECG Impression:  Significant ST abnormalities consistent with ischemia. Comparison with Prior Nuclear Study: No previous nuclear study performed  Overall Impression:  Low risk stress nuclear study with no ischemia or infarction; note markedly positive ECG response in setting of hypertensive BP response.  LV Ejection Fraction: 58%.  NL LV Function; NL Wall Motion  Olga Millers

## 2012-10-06 ENCOUNTER — Ambulatory Visit (INDEPENDENT_AMBULATORY_CARE_PROVIDER_SITE_OTHER): Payer: Medicare Other | Admitting: Cardiology

## 2012-10-06 ENCOUNTER — Encounter: Payer: Self-pay | Admitting: Cardiology

## 2012-10-06 VITALS — BP 120/82 | HR 82 | Ht 73.0 in | Wt 175.1 lb

## 2012-10-06 DIAGNOSIS — R9439 Abnormal result of other cardiovascular function study: Secondary | ICD-10-CM | POA: Diagnosis not present

## 2012-10-06 DIAGNOSIS — M79602 Pain in left arm: Secondary | ICD-10-CM | POA: Insufficient documentation

## 2012-10-06 DIAGNOSIS — M79609 Pain in unspecified limb: Secondary | ICD-10-CM | POA: Diagnosis not present

## 2012-10-06 DIAGNOSIS — I10 Essential (primary) hypertension: Secondary | ICD-10-CM

## 2012-10-06 DIAGNOSIS — R9431 Abnormal electrocardiogram [ECG] [EKG]: Secondary | ICD-10-CM

## 2012-10-06 NOTE — Progress Notes (Signed)
 Joseph Simpson Date of Birth: 08/01/1935 Medical Record #[id number]  History of Present Illness: Joseph Simpson is seen for evaluation of an abnormal stress test. He is a pleasant 77-year-old white male who has a history of hypertension and hyperlipidemia. He is a patient of Dr. Crenshaw. Approximately 4 weeks ago he developed symptoms of left arm pain. He states his blood pressure shot up to the point where he had difficulty walking. He was seen in the emergency department and was noted to have an abnormal ECG. Compared to prior ECGs he had some ST-T wave changes inferiorly that were new. He was subsequently referred for a nuclear stress test which was performed yesterday. Patient was able to walk 6 minutes on the Bruce protocol. He stopped because of fatigue but denied any chest pain. He had marked ST-T wave changes with frequent PVCs and runs of supraventricular tachycardia. He had a hypertensive blood pressure response. Myocardial perfusion imaging was actually normal with ejection fraction of 58%. Patient does report prior cardiac catheterization 12 years ago that was apparently at normal. He has a strong family history of coronary disease. He states he has 10 half siblings and 9 of them have had significant heart disease.  Current Outpatient Prescriptions on File Prior to Visit  Medication Sig Dispense Refill  . acetaminophen (TYLENOL) 500 MG tablet Take 1,000 mg by mouth every 6 (six) hours as needed. PAIN        . bevacizumab (AVASTIN) 2.5 mg/0.1 mL SOLN 2.5 mg. Injection to eye every 11 weeks      . lisinopril (PRINIVIL,ZESTRIL) 40 MG tablet Take 40 mg by mouth every morning.        . metoCLOPramide (REGLAN) 10 MG tablet Take 10 mg by mouth 2 (two) times daily.        . pantoprazole (PROTONIX) 40 MG tablet Take 40 mg by mouth 2 (two) times daily.       . Polyethyl Glycol-Propyl Glycol (SYSTANE ULTRA) 0.4-0.3 % SOLN Apply 1 drop to eye 3 (three) times daily.        . Tamsulosin HCl (FLOMAX) 0.4  MG CAPS Take 1 capsule (0.4 mg total) by mouth daily.  30 capsule  0    Allergies  Allergen Reactions  . Aspirin Other (See Comments)    UPSET STOMACH  . Percocet (Oxycodone-Acetaminophen) Nausea And Vomiting    Past Medical History  Diagnosis Date  . GERD (gastroesophageal reflux disease)   . Hypertension   . Syncope   . OSA (obstructive sleep apnea)   . Melanoma   . Dyslipidemia   . Macular degeneration     Past Surgical History  Procedure Date  . Gastrectomy   . Hemorrhoid surgery   . Melanoma excision 05-05-2007    LEFT FOREARM  . Nose surgery   . Cataract extraction   . Tonsillectomy   . Appendectomy   . Cystoscopy/retrograde/ureteroscopy/stone extraction with basket 02/24/2012    Procedure: CYSTOSCOPY/RETROGRADE/URETEROSCOPY/STONE EXTRACTION WITH BASKET;  Surgeon: Stephen Dahlstedt, MD;  Location: [facility];  Service: Urology;  Laterality: Bilateral;  1 HOUR    History  Smoking status  . Former Smoker  . Quit date: 09/03/1956  Smokeless tobacco  . Not on file    History  Alcohol Use No    Family History  Problem Relation Age of Onset  . Heart disease Mother     CABG in her 70s  . Pneumonia Father   . Cancer Sister     breast      Review of Systems: The review of systems is positive for remote history of syncope. He did have an abnormal tilt table test but has had no recurrent syncope for many years.  All other systems were reviewed and are negative.  Physical Exam: BP 120/82  Pulse 82  Ht 6' 1" (1.854 m)  Wt 175 lb 1.3 oz (79.416 kg)  BMI 23.10 kg/m2  SpO2 94% He is a pleasant white male in no acute distress. He is normocephalic, atraumatic. Pupils are equal round and reactive to light and accommodation. Extraocular movements are full. Sclera are clear. Oropharynx is clear. Neck is supple without JVD, adenopathy, thyromegaly, or bruits. Lungs are clear. Cardiac exam reveals a regular rate and rhythm without gallop, murmur, or  click. Abdomen is soft and nontender. He has old surgical scars. There is no hepatosplenomegaly. Femoral, pedal, and radial pulses are all normal. Skin is warm and dry. He has no edema. He is alert and oriented x3. Cranial nerves II through XII are intact. LABORATORY DATA: ECG at rest demonstrates normal sinus rhythm with voltage criteria for LVH.  Assessment / Plan: 1. Recent symptoms of left arm pain. Abnormal ECG. Stress Myoview study is discordant with decreased exercise tolerance and marked ECG changes but normal perfusion imaging. I recommended cardiac catheterization for more definitive evaluation. This will be scheduled for December 6. The procedure was reviewed in detail with the patient.The procedure and risks were reviewed including but not limited to death, myocardial infarction, stroke, arrythmias, bleeding, transfusion, emergency surgery, dye allergy, or renal dysfunction. The patient voices understanding and is agreeable to proceed.  2. Hypertension. Blood pressure is normal at rest.  3. Hyperlipidemia.  4. Strong family history of coronary disease.  

## 2012-10-07 ENCOUNTER — Other Ambulatory Visit: Payer: Self-pay | Admitting: Internal Medicine

## 2012-10-12 ENCOUNTER — Other Ambulatory Visit: Payer: Self-pay

## 2012-10-12 DIAGNOSIS — R9431 Abnormal electrocardiogram [ECG] [EKG]: Secondary | ICD-10-CM

## 2012-10-12 DIAGNOSIS — T148XXA Other injury of unspecified body region, initial encounter: Secondary | ICD-10-CM

## 2012-10-12 DIAGNOSIS — I10 Essential (primary) hypertension: Secondary | ICD-10-CM

## 2012-10-16 DIAGNOSIS — R9439 Abnormal result of other cardiovascular function study: Secondary | ICD-10-CM | POA: Insufficient documentation

## 2012-10-16 HISTORY — DX: Abnormal result of other cardiovascular function study: R94.39

## 2012-10-16 HISTORY — PX: CARDIAC CATHETERIZATION: SHX172

## 2012-10-18 ENCOUNTER — Other Ambulatory Visit: Payer: Medicare Other

## 2012-10-18 ENCOUNTER — Other Ambulatory Visit (INDEPENDENT_AMBULATORY_CARE_PROVIDER_SITE_OTHER): Payer: Medicare Other

## 2012-10-18 DIAGNOSIS — T148XXA Other injury of unspecified body region, initial encounter: Secondary | ICD-10-CM

## 2012-10-18 DIAGNOSIS — I10 Essential (primary) hypertension: Secondary | ICD-10-CM | POA: Diagnosis not present

## 2012-10-18 DIAGNOSIS — R9431 Abnormal electrocardiogram [ECG] [EKG]: Secondary | ICD-10-CM

## 2012-10-18 LAB — CBC WITH DIFFERENTIAL/PLATELET
Basophils Absolute: 0 10*3/uL (ref 0.0–0.1)
Basophils Relative: 0.4 % (ref 0.0–3.0)
Eosinophils Absolute: 0.2 10*3/uL (ref 0.0–0.7)
Eosinophils Relative: 3.8 % (ref 0.0–5.0)
HCT: 48 % (ref 39.0–52.0)
Hemoglobin: 15.9 g/dL (ref 13.0–17.0)
Lymphocytes Relative: 20.4 % (ref 12.0–46.0)
Lymphs Abs: 1.1 10*3/uL (ref 0.7–4.0)
MCHC: 33.2 g/dL (ref 30.0–36.0)
MCV: 95 fl (ref 78.0–100.0)
Monocytes Absolute: 0.4 10*3/uL (ref 0.1–1.0)
Monocytes Relative: 7.3 % (ref 3.0–12.0)
Neutro Abs: 3.6 10*3/uL (ref 1.4–7.7)
Neutrophils Relative %: 68.1 % (ref 43.0–77.0)
Platelets: 134 10*3/uL — ABNORMAL LOW (ref 150.0–400.0)
RBC: 5.05 Mil/uL (ref 4.22–5.81)
RDW: 13 % (ref 11.5–14.6)
WBC: 5.3 10*3/uL (ref 4.5–10.5)

## 2012-10-18 LAB — PROTIME-INR
INR: 1 ratio (ref 0.8–1.0)
Prothrombin Time: 10.7 s (ref 10.2–12.4)

## 2012-10-18 LAB — BASIC METABOLIC PANEL
BUN: 12 mg/dL (ref 6–23)
CO2: 31 mEq/L (ref 19–32)
Calcium: 9.4 mg/dL (ref 8.4–10.5)
Chloride: 100 mEq/L (ref 96–112)
Creatinine, Ser: 0.9 mg/dL (ref 0.4–1.5)
GFR: 89.51 mL/min (ref >60.00)
Glucose, Bld: 91 mg/dL (ref 70–99)
Potassium: 3.9 mEq/L (ref 3.5–5.1)
Sodium: 139 mEq/L (ref 135–145)

## 2012-10-21 ENCOUNTER — Encounter (HOSPITAL_BASED_OUTPATIENT_CLINIC_OR_DEPARTMENT_OTHER)
Admission: RE | Disposition: A | Discharge status: Home / Self Care | Payer: Self-pay | Source: Ambulatory Visit | Attending: Cardiology

## 2012-10-21 ENCOUNTER — Inpatient Hospital Stay (HOSPITAL_BASED_OUTPATIENT_CLINIC_OR_DEPARTMENT_OTHER)
Admission: RE | Admit: 2012-10-21 | Discharge: 2012-10-21 | Disposition: A | Discharge status: Home / Self Care | Payer: Medicare Other | Source: Ambulatory Visit | Attending: Cardiology | Admitting: Cardiology

## 2012-10-21 ENCOUNTER — Encounter (HOSPITAL_BASED_OUTPATIENT_CLINIC_OR_DEPARTMENT_OTHER): Payer: Self-pay

## 2012-10-21 DIAGNOSIS — Z8249 Family history of ischemic heart disease and other diseases of the circulatory system: Secondary | ICD-10-CM | POA: Insufficient documentation

## 2012-10-21 DIAGNOSIS — I10 Essential (primary) hypertension: Secondary | ICD-10-CM | POA: Insufficient documentation

## 2012-10-21 DIAGNOSIS — R9430 Abnormal result of cardiovascular function study, unspecified: Secondary | ICD-10-CM | POA: Diagnosis not present

## 2012-10-21 DIAGNOSIS — M79609 Pain in unspecified limb: Secondary | ICD-10-CM | POA: Diagnosis not present

## 2012-10-21 DIAGNOSIS — E785 Hyperlipidemia, unspecified: Secondary | ICD-10-CM | POA: Insufficient documentation

## 2012-10-21 DIAGNOSIS — R9439 Abnormal result of other cardiovascular function study: Secondary | ICD-10-CM | POA: Diagnosis not present

## 2012-10-21 SURGERY — JV LEFT HEART CATHETERIZATION WITH CORONARY ANGIOGRAM
Anesthesia: Moderate Sedation

## 2012-10-21 MED ORDER — SODIUM CHLORIDE 0.9 % IV SOLN: INTRAVENOUS | Status: DC

## 2012-10-21 MED ORDER — SODIUM CHLORIDE 0.9 % IV SOLN: 250.0000 mL | INTRAVENOUS | Status: DC | PRN

## 2012-10-21 MED ORDER — SODIUM CHLORIDE 0.9 % IJ SOLN: 3.0000 mL | INTRAMUSCULAR | Status: DC | PRN

## 2012-10-21 MED ORDER — SODIUM CHLORIDE 0.9 % IJ SOLN: 3.0000 mL | Freq: Two times a day (BID) | INTRAMUSCULAR | Status: DC

## 2012-10-21 MED ORDER — ACETAMINOPHEN 325 MG PO TABS: 650.0000 mg | ORAL_TABLET | ORAL | Status: DC | PRN

## 2012-10-21 MED ORDER — ONDANSETRON HCL 4 MG/2ML IJ SOLN: 4.0000 mg | Freq: Four times a day (QID) | INTRAMUSCULAR | Status: DC | PRN

## 2012-10-21 MED ORDER — SODIUM CHLORIDE 0.9 % IV SOLN
1.0000 mL/kg/h | INTRAVENOUS | Status: DC
Start: 2012-10-21 — End: 2012-10-21

## 2012-10-21 NOTE — CV Procedure (Signed)
   Cardiac Catheterization Procedure Note  Name: Joseph Simpson MRN: [id number] DOB: 1934/12/07  Procedure: Left Heart Cath, Selective Coronary Angiography, LV angiography  Indication: 77 year old white male who presents with recent onset of left arm pain. He had a nuclear stress test which she was able to walk for 6 minutes. He had a marked hypertensive blood pressure spots. He had significant ST T wave changes and a run of SVT. Perfusion imaging was normal but given his abnormal stress response cardiac catheterization was recommended.   Procedural details: We initially attempted right radial access. Despite getting good arterial flow with needle stick on multiple occasions we were never able to thread the wire into the vessel. The right groin was prepped, draped, and anesthetized with 1% lidocaine. Using modified Seldinger technique, a 5 French sheath was introduced into the right femoral artery. Standard Judkins catheters were used for coronary angiography and left ventriculography. Catheter exchanges were performed over a guidewire. There were no immediate procedural complications. The patient was transferred to the post catheterization recovery area for further monitoring.  Procedural Findings: Hemodynamics:  AO 146/73 with a mean of 105 mmHg LV 147/21 mmHg   Coronary angiography: Coronary dominance: right  Left mainstem: Normal.  Left anterior descending (LAD): The LAD is rest a large first septal perforator and a moderate sized branching diagonal. The LAD in the mid to distal vessel is small in caliber without any focal stenoses. There are moderate t/then 10-20%.  Left circumflex (LCx): The left circumflex also has mild irregularities less than 10-20%. This is a codominant vessel.  Right coronary artery (RCA): The right coronary is without significant disease.  Left ventriculography: Left ventricular systolic function is normal, LVEF is estimated at 55-65%, there is no significant  mitral regurgitation   Final Conclusions:   1. Minimal nonobstructive coronary disease. 2. Normal left ventricular function.  Recommendations: Medical therapy with focus on blood pressure control.  Peter [facility] 10/21/2012, 10:00 AM

## 2012-10-21 NOTE — H&P (View-Only) (Signed)
Joseph Simpson Date of Birth: 15-Apr-1935 Medical Record #[id number]  History of Present Illness: Joseph Simpson is seen for evaluation of an abnormal stress test. He is a pleasant 77 year old white male who has a history of hypertension and hyperlipidemia. He is a patient of Dr. Jens Som. Approximately 4 weeks ago he developed symptoms of left arm pain. He states his blood pressure shot up to the point where he had difficulty walking. He was seen in the emergency department and was noted to have an abnormal ECG. Compared to prior ECGs he had some ST-T wave changes inferiorly that were new. He was subsequently referred for a nuclear stress test which was performed yesterday. Patient was able to walk 6 minutes on the Bruce protocol. He stopped because of fatigue but denied any chest pain. He had marked ST-T wave changes with frequent PVCs and runs of supraventricular tachycardia. He had a hypertensive blood pressure response. Myocardial perfusion imaging was actually normal with ejection fraction of 58%. Patient does report prior cardiac catheterization 12 years ago that was apparently at normal. He has a strong family history of coronary disease. He states he has 10 half siblings and 9 of them have had significant heart disease.  Current Outpatient Prescriptions on File Prior to Visit  Medication Sig Dispense Refill  . acetaminophen (TYLENOL) 500 MG tablet Take 1,000 mg by mouth every 6 (six) hours as needed. PAIN        . bevacizumab (AVASTIN) 2.5 mg/0.1 mL SOLN 2.5 mg. Injection to eye every 11 weeks      . lisinopril (PRINIVIL,ZESTRIL) 40 MG tablet Take 40 mg by mouth every morning.        . metoCLOPramide (REGLAN) 10 MG tablet Take 10 mg by mouth 2 (two) times daily.        . pantoprazole (PROTONIX) 40 MG tablet Take 40 mg by mouth 2 (two) times daily.       Bertram Gala Glycol-Propyl Glycol (SYSTANE ULTRA) 0.4-0.3 % SOLN Apply 1 drop to eye 3 (three) times daily.        . Tamsulosin HCl (FLOMAX) 0.4  MG CAPS Take 1 capsule (0.4 mg total) by mouth daily.  30 capsule  0    Allergies  Allergen Reactions  . Aspirin Other (See Comments)    UPSET STOMACH  . Percocet (Oxycodone-Acetaminophen) Nausea And Vomiting    Past Medical History  Diagnosis Date  . GERD (gastroesophageal reflux disease)   . Hypertension   . Syncope   . OSA (obstructive sleep apnea)   . Melanoma   . Dyslipidemia   . Macular degeneration     Past Surgical History  Procedure Date  . Gastrectomy   . Hemorrhoid surgery   . Melanoma excision 05-05-2007    LEFT FOREARM  . Nose surgery   . Cataract extraction   . Tonsillectomy   . Appendectomy   . Cystoscopy/retrograde/ureteroscopy/stone extraction with basket 02/24/2012    Procedure: CYSTOSCOPY/RETROGRADE/URETEROSCOPY/STONE EXTRACTION WITH BASKET;  Surgeon: Marcine Matar, MD;  Location: [facility];  Service: Urology;  Laterality: Bilateral;  1 HOUR    History  Smoking status  . Former Smoker  . Quit date: 09/03/1956  Smokeless tobacco  . Not on file    History  Alcohol Use No    Family History  Problem Relation Age of Onset  . Heart disease Mother     CABG in her 60s  . Pneumonia Father   . Cancer Sister     breast  Review of Systems: The review of systems is positive for remote history of syncope. He did have an abnormal tilt table test but has had no recurrent syncope for many years.  All other systems were reviewed and are negative.  Physical Exam: BP 120/82  Pulse 82  Ht 6\' 1"  (1.854 m)  Wt 175 lb 1.3 oz (79.416 kg)  BMI 23.10 kg/m2  SpO2 94% He is a pleasant white male in no acute distress. He is normocephalic, atraumatic. Pupils are equal round and reactive to light and accommodation. Extraocular movements are full. Sclera are clear. Oropharynx is clear. Neck is supple without JVD, adenopathy, thyromegaly, or bruits. Lungs are clear. Cardiac exam reveals a regular rate and rhythm without gallop, murmur, or  click. Abdomen is soft and nontender. He has old surgical scars. There is no hepatosplenomegaly. Femoral, pedal, and radial pulses are all normal. Skin is warm and dry. He has no edema. He is alert and oriented x3. Cranial nerves II through XII are intact. LABORATORY DATA: ECG at rest demonstrates normal sinus rhythm with voltage criteria for LVH.  Assessment / Plan: 1. Recent symptoms of left arm pain. Abnormal ECG. Stress Myoview study is discordant with decreased exercise tolerance and marked ECG changes but normal perfusion imaging. I recommended cardiac catheterization for more definitive evaluation. This will be scheduled for December 6. The procedure was reviewed in detail with the patient.The procedure and risks were reviewed including but not limited to death, myocardial infarction, stroke, arrythmias, bleeding, transfusion, emergency surgery, dye allergy, or renal dysfunction. The patient voices understanding and is agreeable to proceed.  2. Hypertension. Blood pressure is normal at rest.  3. Hyperlipidemia.  4. Strong family history of coronary disease.

## 2012-10-21 NOTE — OR Nursing (Signed)
Tegaderm dressing applied, site level 0, bedrest begins at 1015 

## 2012-10-21 NOTE — OR Nursing (Signed)
Dr Jordan at bedside to discuss results and treatment plan with pt and family 

## 2012-10-21 NOTE — Interval H&P Note (Signed)
History and Physical Interval Note:  10/21/2012 9:09 AM  Joseph Simpson  has presented today for surgery, with the diagnosis of abnormal myoview  The various methods of treatment have been discussed with the patient and family. After consideration of risks, benefits and other options for treatment, the patient has consented to  Procedure(s) (LRB) with comments: JV LEFT HEART CATHETERIZATION WITH CORONARY ANGIOGRAM (N/A) as a surgical intervention .  The patient's history has been reviewed, patient examined, no change in status, stable for surgery.  I have reviewed the patient's chart and labs.  Questions were answered to the patient's satisfaction.     Theron Arista [facility] 10/21/2012 9:09 AM

## 2012-10-21 NOTE — OR Nursing (Signed)
Meal served 

## 2012-10-26 ENCOUNTER — Encounter: Payer: Self-pay | Admitting: Family Medicine

## 2012-10-26 ENCOUNTER — Ambulatory Visit (INDEPENDENT_AMBULATORY_CARE_PROVIDER_SITE_OTHER): Payer: Medicare Other | Admitting: Family Medicine

## 2012-10-26 VITALS — BP 128/70 | HR 83 | Temp 98.1°F | Wt 177.0 lb

## 2012-10-26 DIAGNOSIS — J069 Acute upper respiratory infection, unspecified: Secondary | ICD-10-CM | POA: Diagnosis not present

## 2012-10-26 MED ORDER — BENZONATATE 100 MG PO CAPS: 100.0000 mg | ORAL_CAPSULE | Freq: Three times a day (TID) | ORAL | Status: DC | PRN

## 2012-10-26 NOTE — Progress Notes (Signed)
Chief Complaint  Patient presents with  . Cough    productive; mucus- yellowish/gray color, hoarseness since last Thursday     HPI: -started: 6 days ago -symptoms:nasal congestion, sore throat, cough -denies:fever, SOB, NVD, tooth pain, strep or mono exposure -has tried: OTC cough medicine -sick contacts: none known -Hx of: no hx of asthma, allergies, sinusitis   ROS: See pertinent positives and negatives per HPI.  Past Medical History  Diagnosis Date  . GERD (gastroesophageal reflux disease)   . Hypertension   . Syncope   . OSA (obstructive sleep apnea)   . Melanoma   . Dyslipidemia   . Macular degeneration     Family History  Problem Relation Age of Onset  . Heart disease Mother     CABG in her 22s  . Pneumonia Father   . Cancer Sister     breast    History   Social History  . Marital Status: Married    Spouse Name: N/A    Number of Children: 1  . Years of Education: N/A   Social History Main Topics  . Smoking status: Former Smoker    Quit date: 09/03/1956  . Smokeless tobacco: None  . Alcohol Use: No  . Drug Use: No  . Sexually Active: Yes   Other Topics Concern  . None   Social History Narrative  . None    Current outpatient prescriptions:acetaminophen (TYLENOL) 500 MG tablet, Take 1,000 mg by mouth every 6 (six) hours as needed. PAIN  , Disp: , Rfl: ;  bevacizumab (AVASTIN) 2.5 mg/0.1 mL SOLN, 2.5 mg. Injection to eye every 11 weeks, Disp: , Rfl: ;  lisinopril (PRINIVIL,ZESTRIL) 40 MG tablet, Take 40 mg by mouth every morning.  , Disp: , Rfl: ;  lisinopril (PRINIVIL,ZESTRIL) 40 MG tablet, take 1 tablet by mouth once daily, Disp: 90 tablet, Rfl: 1 metoCLOPramide (REGLAN) 10 MG tablet, Take 10 mg by mouth 2 (two) times daily.  , Disp: , Rfl: ;  pantoprazole (PROTONIX) 40 MG tablet, Take 40 mg by mouth 2 (two) times daily. , Disp: , Rfl: ;  Polyethyl Glycol-Propyl Glycol (SYSTANE ULTRA) 0.4-0.3 % SOLN, Apply 1 drop to eye 3 (three) times daily.  , Disp:  , Rfl: ;  Tamsulosin HCl (FLOMAX) 0.4 MG CAPS, Take 1 capsule (0.4 mg total) by mouth daily., Disp: 30 capsule, Rfl: 0 benzonatate (TESSALON PERLES) 100 MG capsule, Take 1 capsule (100 mg total) by mouth 3 (three) times daily as needed for cough., Disp: 20 capsule, Rfl: 0  EXAM:  Filed Vitals:   10/26/12 1052  BP: 128/70  Pulse: 83  Temp: 98.1 F (36.7 C)    There is no height on file to calculate BMI.  GENERAL: vitals reviewed and listed above, alert, oriented, appears well hydrated and in no acute distress  HEENT: atraumatic, conjunttiva clear, no obvious abnormalities on inspection of external nose and ears, normal appearance of ear canals and TMs, clear nasal congestion, mild post oropharyngeal erythema with PND, no tonsillar edema or exudate, no sinus TTP  NECK: no obvious masses on inspection  LUNGS: clear to auscultation bilaterally, no wheezes, rales or rhonchi, good air movement  CV: HRRR, no peripheral edema  MS: moves all extremities without noticeable abnormality  PSYCH: pleasant and cooperative, no obvious depression or anxiety  ASSESSMENT AND PLAN:  Discussed the following assessment and plan:  1. Viral upper respiratory infection  benzonatate (TESSALON PERLES) 100 MG capsule  -advised routine f/u with PCP  -Patient advised  to return or notify a doctor immediately if symptoms worsen or persist or new concerns arise.  Patient Instructions  INSTRUCTIONS FOR UPPER RESPIRATORY INFECTION:  -plenty of rest and fluids  -nasal saline wash 2-3 times daily (use prepackaged nasal saline or bottled/distilled water if making your own)   -can use sinex nasal spray for drainage and nasal congestion - but do NOT use longer then 3-4 days  -can use tylenol or ibuprofen as directed for aches and sorethroat  -in the winter time, using a humidifier at night is helpful (please follow cleaning instructions)  -if you are taking a cough medication - use only as directed, may  also try a teaspoon of honey to coat the throat and throat lozenges -As we discussed, we have prescribed a new medication (TESSALON TO USE IF NEEDED FOR COUGH). We discussed the common and serious potential adverse effects of this medication and you can review these and more with the pharmacist when you pick up your medication.  Please follow the instructions for use carefully and notify us immediately if you have any problems taking this medication.   -for sore throat, salt water gargles can help  -follow up if you have fevers, facial pain, tooth pain, difficulty breathing or are worsening or not getting better in 5-7 days      Bhavana Kady, Dahlia Client R.

## 2012-10-26 NOTE — Patient Instructions (Signed)
INSTRUCTIONS FOR UPPER RESPIRATORY INFECTION:  -plenty of rest and fluids  -nasal saline wash 2-3 times daily (use prepackaged nasal saline or bottled/distilled water if making your own)   -can use sinex nasal spray for drainage and nasal congestion - but do NOT use longer then 3-4 days  -can use tylenol or ibuprofen as directed for aches and sorethroat  -in the winter time, using a humidifier at night is helpful (please follow cleaning instructions)  -if you are taking a cough medication - use only as directed, may also try a teaspoon of honey to coat the throat and throat lozenges -As we discussed, we have prescribed a new medication (TESSALON TO USE IF NEEDED FOR COUGH). We discussed the common and serious potential adverse effects of this medication and you can review these and more with the pharmacist when you pick up your medication.  Please follow the instructions for use carefully and notify us immediately if you have any problems taking this medication.   -for sore throat, salt water gargles can help  -follow up if you have fevers, facial pain, tooth pain, difficulty breathing or are worsening or not getting better in 5-7 days

## 2012-10-31 ENCOUNTER — Ambulatory Visit (INDEPENDENT_AMBULATORY_CARE_PROVIDER_SITE_OTHER): Payer: Medicare Other | Admitting: Nurse Practitioner

## 2012-10-31 ENCOUNTER — Encounter: Payer: Self-pay | Admitting: Nurse Practitioner

## 2012-10-31 VITALS — BP 110/64 | HR 76 | Ht 73.0 in | Wt 174.8 lb

## 2012-10-31 DIAGNOSIS — Z9889 Other specified postprocedural states: Secondary | ICD-10-CM | POA: Diagnosis not present

## 2012-10-31 MED ORDER — METOPROLOL TARTRATE 25 MG PO TABS: 25.0000 mg | ORAL_TABLET | Freq: Two times a day (BID) | ORAL | Status: DC

## 2012-10-31 NOTE — Progress Notes (Signed)
Joseph Simpson Date of Birth: 1934-12-29 Medical Record #[id number]  History of Present Illness: Joseph Simpson is seen back today for a post cath visit. He is seen for Dr. Ronnette Juniper. He has had an abnormal stress test with abnormal EKG/runs of SVT and marked ST-T wavhe changes. His imaging was actually normal. He was referred for cardiac cath however with results as noted below. His other issues include strong family history of CAD, GERD, HTN, OSA, HLD, melanoma and macular degeneration.   He comes in today. He is here alone. He is doing pretty well. No problems with his groin. Has been checking his blood pressure at home. He has had readings in the 160's systolic. Seems to be running higher in the early mornings. Had a recurrent spell yesterday of chest burning and felt dizzy. Does endorse some palpitations. He has GERD from a remote stomach surgery and remains on chronic PPI and reglan therapy.   Current Outpatient Prescriptions on File Prior to Visit  Medication Sig Dispense Refill  . acetaminophen (TYLENOL) 500 MG tablet Take 1,000 mg by mouth every 6 (six) hours as needed. PAIN        . benzonatate (TESSALON PERLES) 100 MG capsule Take 1 capsule (100 mg total) by mouth 3 (three) times daily as needed for cough.  20 capsule  0  . bevacizumab (AVASTIN) 2.5 mg/0.1 mL SOLN 2.5 mg. Injection to eye every 11 weeks      . lisinopril (PRINIVIL,ZESTRIL) 40 MG tablet Take 40 mg by mouth every morning.        . metoCLOPramide (REGLAN) 10 MG tablet Take 10 mg by mouth 2 (two) times daily.        . pantoprazole (PROTONIX) 40 MG tablet Take 40 mg by mouth 2 (two) times daily.       Bertram Gala Glycol-Propyl Glycol (SYSTANE ULTRA) 0.4-0.3 % SOLN Apply 1 drop to eye 3 (three) times daily.        . metoprolol tartrate (LOPRESSOR) 25 MG tablet Take 1 tablet (25 mg total) by mouth 2 (two) times daily.  60 tablet  3    Allergies  Allergen Reactions  . Aspirin Other (See Comments)    UPSET STOMACH  .  Percocet (Oxycodone-Acetaminophen) Nausea And Vomiting    Past Medical History  Diagnosis Date  . GERD (gastroesophageal reflux disease)   . Hypertension   . Syncope   . OSA (obstructive sleep apnea)   . Melanoma   . Dyslipidemia   . Macular degeneration   . Abnormal stress test December 2013    s/p cardiac cath with minimal nonobstructive CAD and normal LV function; medical management recommended    Past Surgical History  Procedure Date  . Gastrectomy   . Hemorrhoid surgery   . Melanoma excision 05-05-2007    LEFT FOREARM  . Nose surgery   . Cataract extraction   . Tonsillectomy   . Appendectomy   . Cystoscopy/retrograde/ureteroscopy/stone extraction with basket 02/24/2012    Procedure: CYSTOSCOPY/RETROGRADE/URETEROSCOPY/STONE EXTRACTION WITH BASKET;  Surgeon: Marcine Matar, MD;  Location: [facility];  Service: Urology;  Laterality: Bilateral;  1 HOUR  . Cardiac catheterization December 2013    minimal nonobstructive CAD with normal LV function    History  Smoking status  . Former Smoker  . Quit date: 09/03/1956  Smokeless tobacco  . Not on file    History  Alcohol Use No    Family History  Problem Relation Age of Onset  . Heart disease  Mother     CABG in her 81s  . Pneumonia Father   . Cancer Sister     breast    Review of Systems: The review of systems is per the HPI.  All other systems were reviewed and are negative.  Physical Exam: BP 110/64  Pulse 76  Ht 6\' 1"  (1.854 m)  Wt 174 lb 12.8 oz (79.289 kg)  BMI 23.06 kg/m2 Patient is very pleasant and in no acute distress. Skin is warm and dry. Color is normal.  HEENT is unremarkable. Normocephalic/atraumatic. PERRL. Sclera are nonicteric. Neck is supple. No masses. No JVD. Lungs are clear. Cardiac exam shows a regular rate and rhythm. Abdomen is soft. Extremities are without edema. Gait and ROM are intact. No gross neurologic deficits noted.  LABORATORY DATA: n/a  Lab Results    Component Value Date   WBC 5.3 10/18/2012   HGB 15.9 10/18/2012   HCT 48.0 10/18/2012   PLT 134.0* 10/18/2012   GLUCOSE 91 10/18/2012   CHOL 167 09/04/2012   TRIG 99 09/04/2012   HDL 40 09/04/2012   LDLCALC 107* 09/04/2012   ALT 15 09/03/2012   AST 19 09/03/2012   NA 139 10/18/2012   K 3.9 10/18/2012   CL 100 10/18/2012   CREATININE 0.9 10/18/2012   BUN 12 10/18/2012   CO2 31 10/18/2012   TSH 1.734 09/03/2012   PSA 2.42 10/02/2010   INR 1.0 10/18/2012   Coronary angiography:   Left mainstem: Normal.  Left anterior descending (LAD): The LAD is rest a large first septal perforator and a moderate sized branching diagonal. The LAD in the mid to distal vessel is small in caliber without any focal stenoses. There are moderate t/then 10-20%.  Left circumflex (LCx): The left circumflex also has mild irregularities less than 10-20%. This is a codominant vessel.  Right coronary artery (RCA): The right coronary is without significant disease.  Left ventriculography: Left ventricular systolic function is normal, LVEF is estimated at 55-65%, there is no significant mitral regurgitation   Final Conclusions:  1. Minimal nonobstructive coronary disease.  2. Normal left ventricular function.   Recommendations: Medical therapy with focus on blood pressure control.  [facility]  10/21/2012, 10:00 AM    Assessment / Plan:  1. S/P cardiac cath - minimal CAD.  2. HTN - he is to keep monitoring at home. Looks like his early morning readings are elevated. Will try some low dose Lopressor.  3. SVT/palpitations - I am adding low dose BB therapy. Hopefully this will help. May need to consider event monitor if he continues to have dizzy spells.   Patient is agreeable to this plan and will call if any problems develop in the interim.

## 2012-10-31 NOTE — Patient Instructions (Addendum)
Stay on your current medicines but we are adding Lopressor 25 mg two times a day. This may help with the "extra" beats you feel and will help with your blood pressure  Keep monitoring your blood pressure at home. Keep a diary for me and bring to your next visit.   Avoid salt  Stay active.   I want to see you in a month  Call the [facility] office at [phone] if you have any questions, problems or concerns.

## 2012-11-03 ENCOUNTER — Encounter (INDEPENDENT_AMBULATORY_CARE_PROVIDER_SITE_OTHER): Payer: Medicare Other | Admitting: Ophthalmology

## 2012-11-03 DIAGNOSIS — D3130 Benign neoplasm of unspecified choroid: Secondary | ICD-10-CM

## 2012-11-03 DIAGNOSIS — I10 Essential (primary) hypertension: Secondary | ICD-10-CM

## 2012-11-03 DIAGNOSIS — H2510 Age-related nuclear cataract, unspecified eye: Secondary | ICD-10-CM

## 2012-11-03 DIAGNOSIS — H35039 Hypertensive retinopathy, unspecified eye: Secondary | ICD-10-CM

## 2012-11-03 DIAGNOSIS — H353290 Exudative age-related macular degeneration, unspecified eye, stage unspecified: Secondary | ICD-10-CM

## 2012-11-03 DIAGNOSIS — H43819 Vitreous degeneration, unspecified eye: Secondary | ICD-10-CM

## 2012-11-17 ENCOUNTER — Encounter (INDEPENDENT_AMBULATORY_CARE_PROVIDER_SITE_OTHER): Payer: Medicare Other | Admitting: Ophthalmology

## 2012-11-17 DIAGNOSIS — H353290 Exudative age-related macular degeneration, unspecified eye, stage unspecified: Secondary | ICD-10-CM

## 2012-11-17 DIAGNOSIS — D3130 Benign neoplasm of unspecified choroid: Secondary | ICD-10-CM

## 2012-11-17 DIAGNOSIS — H35039 Hypertensive retinopathy, unspecified eye: Secondary | ICD-10-CM | POA: Diagnosis not present

## 2012-11-17 DIAGNOSIS — I10 Essential (primary) hypertension: Secondary | ICD-10-CM

## 2012-11-17 DIAGNOSIS — H2510 Age-related nuclear cataract, unspecified eye: Secondary | ICD-10-CM

## 2012-11-17 DIAGNOSIS — H43819 Vitreous degeneration, unspecified eye: Secondary | ICD-10-CM

## 2012-11-29 ENCOUNTER — Ambulatory Visit (INDEPENDENT_AMBULATORY_CARE_PROVIDER_SITE_OTHER): Payer: Medicare Other | Admitting: Nurse Practitioner

## 2012-11-29 ENCOUNTER — Encounter: Payer: Self-pay | Admitting: Nurse Practitioner

## 2012-11-29 VITALS — BP 120/58 | HR 56 | Ht 73.0 in | Wt 173.1 lb

## 2012-11-29 DIAGNOSIS — I259 Chronic ischemic heart disease, unspecified: Secondary | ICD-10-CM

## 2012-11-29 NOTE — Progress Notes (Signed)
Joseph Simpson Date of Birth: [birth year] Medical Record #[id number]  History of Present Illness: Joseph Simpson is seen back today for a one month check. He is seen for Dr. Swaziland. He has had an abnormal stress test with abnormal EKG/runs of SVT and marked ST-T wave changes. Images were normal. Had cardiac cath showing minimal nonobstructive CAD and normal LV function. Managed medically with emphasis on BP control. BP was high and his medicines have been adjusted. We added lopressor last month. Other issues include strong family history of CAD, GERD, HTN, OSA, HLD, melanoma and macular degeneration.   He comes in today. He is doing well. He remains active. Back walking. Palpitations have improved. Some fleeting chest pain if he lies on his side. It goes away with turning over. BP list from home shows fairly good control. He feels ok on his medicines. Not dizzy or lightheaded. Did have a URI right after his last visit here that has resolved as of 2 weeks ago. He is quite happy with how he is feeling.   Current Outpatient Prescriptions on File Prior to Visit  Medication Sig Dispense Refill  . acetaminophen (TYLENOL) 500 MG tablet Take 1,000 mg by mouth every 6 (six) hours as needed. PAIN        . bevacizumab (AVASTIN) 2.5 mg/0.1 mL SOLN 2.5 mg. Injection to eye every 11 weeks      . lisinopril (PRINIVIL,ZESTRIL) 40 MG tablet Take 40 mg by mouth every morning.        . metoCLOPramide (REGLAN) 10 MG tablet Take 10 mg by mouth 2 (two) times daily.        . metoprolol tartrate (LOPRESSOR) 25 MG tablet Take 1 tablet (25 mg total) by mouth 2 (two) times daily.  60 tablet  3  . pantoprazole (PROTONIX) 40 MG tablet Take 40 mg by mouth 2 (two) times daily.       Bertram Gala Glycol-Propyl Glycol (SYSTANE ULTRA) 0.4-0.3 % SOLN Apply 1 drop to eye 3 (three) times daily.        . Tamsulosin HCl (FLOMAX) 0.4 MG CAPS Take 0.4 mg by mouth as needed.        Allergies  Allergen Reactions  . Aspirin Other (See  Comments)    UPSET STOMACH  . Percocet (Oxycodone-Acetaminophen) Nausea And Vomiting    Past Medical History  Diagnosis Date  . GERD (gastroesophageal reflux disease)   . Hypertension   . Syncope   . OSA (obstructive sleep apnea)   . Melanoma   . Dyslipidemia   . Macular degeneration   . Abnormal stress test December 2013    s/p cardiac cath with minimal nonobstructive CAD and normal LV function; medical management recommended    Past Surgical History  Procedure Date  . Gastrectomy   . Hemorrhoid surgery   . Melanoma excision 05-05-2007    LEFT FOREARM  . Nose surgery   . Cataract extraction   . Tonsillectomy   . Appendectomy   . Cystoscopy/retrograde/ureteroscopy/stone extraction with basket 02/24/2012    Procedure: CYSTOSCOPY/RETROGRADE/URETEROSCOPY/STONE EXTRACTION WITH BASKET;  Surgeon: Marcine Matar, MD;  Location: [facility];  Service: Urology;  Laterality: Bilateral;  1 HOUR  . Cardiac catheterization December 2013    minimal nonobstructive CAD with normal LV function    History  Smoking status  . Former Smoker  . Quit date: 09/03/1956  Smokeless tobacco  . Not on file    History  Alcohol Use No    Family History  Problem Relation Age of Onset  . Heart disease Mother     CABG in her 57s  . Pneumonia Father   . Cancer Sister     breast    Review of Systems: The review of systems is per the HPI.  All other systems were reviewed and are negative.  Physical Exam: BP 120/58  Pulse 56  Ht 6\' 1"  (1.854 m)  Wt 173 lb 1.9 oz (78.527 kg)  BMI 22.84 kg/m2 Patient is very pleasant and in no acute distress. Skin is warm and dry. Color is normal.  HEENT is unremarkable. Normocephalic/atraumatic. PERRL. Sclera are nonicteric. Neck is supple. No masses. No JVD. Lungs are clear. Cardiac exam shows a regular rate and rhythm. Abdomen is soft. Extremities are without edema. Gait and ROM are intact. No gross neurologic deficits  noted.   LABORATORY DATA: Lab Results  Component Value Date   WBC 5.3 10/18/2012   HGB 15.9 10/18/2012   HCT 48.0 10/18/2012   PLT 134.0* 10/18/2012   GLUCOSE 91 10/18/2012   CHOL 167 09/04/2012   TRIG 99 09/04/2012   HDL 40 09/04/2012   LDLCALC 107* 09/04/2012   ALT 15 09/03/2012   AST 19 09/03/2012   NA 139 10/18/2012   K 3.9 10/18/2012   CL 100 10/18/2012   CREATININE 0.9 10/18/2012   BUN 12 10/18/2012   CO2 31 10/18/2012   TSH 1.734 09/03/2012   PSA 2.42 10/02/2010   INR 1.0 10/18/2012     Assessment / Plan: 1. CAD - minimal per recent cath. Managed medically. Doing well clinically.   2. HTN - improved with his current regimen. He will continue to monitor at home.  3. SVT/palpitations - improved with current therapy.  Overall, he is doing well. No change in his current regimen. He will continue to monitor his BP at home. See Dr. Swaziland back in 4 months.  Patient is agreeable to this plan and will call if any problems develop in the interim.

## 2012-11-29 NOTE — Patient Instructions (Addendum)
I think you are doing well  Stay active  Continue to monitor your blood pressure at home and keep a diary  Stay on your current medicines  We will see you back in about 4 months with Dr. Swaziland  Call the [facility] office at [phone] if you have any questions, problems or concerns.

## 2012-12-09 ENCOUNTER — Other Ambulatory Visit: Payer: Self-pay | Admitting: Dermatology

## 2012-12-09 DIAGNOSIS — D0460 Carcinoma in situ of skin of unspecified upper limb, including shoulder: Secondary | ICD-10-CM | POA: Diagnosis not present

## 2012-12-09 DIAGNOSIS — D485 Neoplasm of uncertain behavior of skin: Secondary | ICD-10-CM | POA: Diagnosis not present

## 2012-12-09 DIAGNOSIS — Z85820 Personal history of malignant melanoma of skin: Secondary | ICD-10-CM | POA: Diagnosis not present

## 2012-12-09 DIAGNOSIS — D239 Other benign neoplasm of skin, unspecified: Secondary | ICD-10-CM | POA: Diagnosis not present

## 2012-12-09 DIAGNOSIS — L570 Actinic keratosis: Secondary | ICD-10-CM | POA: Diagnosis not present

## 2012-12-27 ENCOUNTER — Encounter (INDEPENDENT_AMBULATORY_CARE_PROVIDER_SITE_OTHER): Payer: Medicare Other | Admitting: Ophthalmology

## 2012-12-27 DIAGNOSIS — H35039 Hypertensive retinopathy, unspecified eye: Secondary | ICD-10-CM | POA: Diagnosis not present

## 2012-12-27 DIAGNOSIS — I10 Essential (primary) hypertension: Secondary | ICD-10-CM | POA: Diagnosis not present

## 2012-12-27 DIAGNOSIS — H2510 Age-related nuclear cataract, unspecified eye: Secondary | ICD-10-CM

## 2012-12-27 DIAGNOSIS — D3130 Benign neoplasm of unspecified choroid: Secondary | ICD-10-CM | POA: Diagnosis not present

## 2012-12-27 DIAGNOSIS — H353290 Exudative age-related macular degeneration, unspecified eye, stage unspecified: Secondary | ICD-10-CM

## 2012-12-27 DIAGNOSIS — D045 Carcinoma in situ of skin of trunk: Secondary | ICD-10-CM | POA: Diagnosis not present

## 2012-12-27 DIAGNOSIS — H43819 Vitreous degeneration, unspecified eye: Secondary | ICD-10-CM

## 2012-12-29 ENCOUNTER — Encounter (INDEPENDENT_AMBULATORY_CARE_PROVIDER_SITE_OTHER): Payer: Medicare Other | Admitting: Ophthalmology

## 2013-01-05 ENCOUNTER — Encounter (INDEPENDENT_AMBULATORY_CARE_PROVIDER_SITE_OTHER): Payer: Medicare Other | Admitting: Ophthalmology

## 2013-01-18 DIAGNOSIS — M9981 Other biomechanical lesions of cervical region: Secondary | ICD-10-CM | POA: Diagnosis not present

## 2013-01-18 DIAGNOSIS — M5030 Other cervical disc degeneration, unspecified cervical region: Secondary | ICD-10-CM | POA: Diagnosis not present

## 2013-01-25 DIAGNOSIS — M9981 Other biomechanical lesions of cervical region: Secondary | ICD-10-CM | POA: Diagnosis not present

## 2013-01-25 DIAGNOSIS — M5030 Other cervical disc degeneration, unspecified cervical region: Secondary | ICD-10-CM | POA: Diagnosis not present

## 2013-02-15 ENCOUNTER — Encounter (INDEPENDENT_AMBULATORY_CARE_PROVIDER_SITE_OTHER): Payer: Medicare Other | Admitting: Ophthalmology

## 2013-02-15 DIAGNOSIS — H35039 Hypertensive retinopathy, unspecified eye: Secondary | ICD-10-CM

## 2013-02-15 DIAGNOSIS — H353290 Exudative age-related macular degeneration, unspecified eye, stage unspecified: Secondary | ICD-10-CM

## 2013-02-15 DIAGNOSIS — H2700 Aphakia, unspecified eye: Secondary | ICD-10-CM

## 2013-02-15 DIAGNOSIS — I10 Essential (primary) hypertension: Secondary | ICD-10-CM | POA: Diagnosis not present

## 2013-02-15 DIAGNOSIS — D3130 Benign neoplasm of unspecified choroid: Secondary | ICD-10-CM | POA: Diagnosis not present

## 2013-02-17 ENCOUNTER — Ambulatory Visit: Payer: Medicare Other | Admitting: Internal Medicine

## 2013-02-20 ENCOUNTER — Encounter: Payer: Self-pay | Admitting: Internal Medicine

## 2013-02-20 ENCOUNTER — Ambulatory Visit (INDEPENDENT_AMBULATORY_CARE_PROVIDER_SITE_OTHER): Payer: Medicare Other | Admitting: Internal Medicine

## 2013-02-20 VITALS — BP 122/74 | HR 64 | Temp 97.8°F | Wt 176.0 lb

## 2013-02-20 DIAGNOSIS — N200 Calculus of kidney: Secondary | ICD-10-CM | POA: Diagnosis not present

## 2013-02-20 DIAGNOSIS — I10 Essential (primary) hypertension: Secondary | ICD-10-CM

## 2013-02-20 DIAGNOSIS — N201 Calculus of ureter: Secondary | ICD-10-CM | POA: Diagnosis not present

## 2013-02-20 DIAGNOSIS — C4360 Malignant melanoma of unspecified upper limb, including shoulder: Secondary | ICD-10-CM | POA: Diagnosis not present

## 2013-02-20 DIAGNOSIS — R011 Cardiac murmur, unspecified: Secondary | ICD-10-CM | POA: Diagnosis not present

## 2013-02-20 DIAGNOSIS — K5730 Diverticulosis of large intestine without perforation or abscess without bleeding: Secondary | ICD-10-CM | POA: Diagnosis not present

## 2013-02-20 NOTE — Progress Notes (Signed)
Melanoma- has regular f/u with dermatology  htn- tolerating meds. No home BPs  Heart Murmur- reviewed echo from 2010. No need for further evaluation  Past Medical History  Diagnosis Date  . GERD (gastroesophageal reflux disease)   . Hypertension   . Syncope   . OSA (obstructive sleep apnea)   . Melanoma   . Dyslipidemia   . Macular degeneration   . Abnormal stress test December 2013    s/p cardiac cath with minimal nonobstructive CAD and normal LV function; medical management recommended    History   Social History  . Marital Status: Married    Spouse Name: N/A    Number of Children: 1  . Years of Education: N/A   Occupational History  . Not on file.   Social History Main Topics  . Smoking status: Former Smoker    Quit date: 09/03/1956  . Smokeless tobacco: Not on file  . Alcohol Use: No  . Drug Use: No  . Sexually Active: Yes   Other Topics Concern  . Not on file   Social History Narrative  . No narrative on file    Past Surgical History  Procedure Laterality Date  . Gastrectomy    . Hemorrhoid surgery    . Melanoma excision  05-05-2007    LEFT FOREARM  . Nose surgery    . Cataract extraction    . Tonsillectomy    . Appendectomy    . Cystoscopy/retrograde/ureteroscopy/stone extraction with basket  02/24/2012    Procedure: CYSTOSCOPY/RETROGRADE/URETEROSCOPY/STONE EXTRACTION WITH BASKET;  Surgeon: Marcine Matar, MD;  Location: [facility];  Service: Urology;  Laterality: Bilateral;  1 HOUR  . Cardiac catheterization  December 2013    minimal nonobstructive CAD with normal LV function    Family History  Problem Relation Age of Onset  . Heart disease Mother     CABG in her 50s  . Pneumonia Father   . Cancer Sister     breast    Allergies  Allergen Reactions  . Aspirin Other (See Comments)    UPSET STOMACH  . Percocet (Oxycodone-Acetaminophen) Nausea And Vomiting    Current Outpatient Prescriptions on File Prior to Visit   Medication Sig Dispense Refill  . acetaminophen (TYLENOL) 500 MG tablet Take 1,000 mg by mouth every 6 (six) hours as needed. PAIN        . bevacizumab (AVASTIN) 2.5 mg/0.1 mL SOLN 2.5 mg. Injection to eye every 8 weeks      . lisinopril (PRINIVIL,ZESTRIL) 40 MG tablet Take 40 mg by mouth every morning.        . metoCLOPramide (REGLAN) 10 MG tablet Take 10 mg by mouth 2 (two) times daily.        . metoprolol tartrate (LOPRESSOR) 25 MG tablet Take 1 tablet (25 mg total) by mouth 2 (two) times daily.  60 tablet  3  . pantoprazole (PROTONIX) 40 MG tablet Take 40 mg by mouth 2 (two) times daily.       Bertram Gala Glycol-Propyl Glycol (SYSTANE ULTRA) 0.4-0.3 % SOLN Apply 1 drop to eye 3 (three) times daily.        . Tamsulosin HCl (FLOMAX) 0.4 MG CAPS Take 0.4 mg by mouth as needed.       No current facility-administered medications on file prior to visit.     patient denies chest pain, shortness of breath, orthopnea. Denies lower extremity edema, abdominal pain, change in appetite, change in bowel movements. Patient denies rashes, musculoskeletal complaints. No other  specific complaints in a complete review of systems.   BP 122/74  Pulse 64  Temp(Src) 97.8 F (36.6 C) (Oral)  Wt 176 lb (79.833 kg)  BMI 23.23 kg/m2  well-developed well-nourished male in no acute distress. HEENT exam atraumatic, normocephalic, neck supple without jugular venous distention. Chest clear to auscultation cardiac exam S1-S2 are regular. Abdominal exam overweight with bowel sounds, soft and nontender. Extremities no edema. Neurologic exam is alert with a normal gait.

## 2013-02-20 NOTE — Assessment & Plan Note (Signed)
He has not sxs. No need for further eval

## 2013-02-20 NOTE — Assessment & Plan Note (Signed)
BP Readings from Last 3 Encounters:  02/20/13 122/74  11/29/12 120/58  10/31/12 110/64   Controlled, continue meds

## 2013-02-20 NOTE — Assessment & Plan Note (Signed)
Has regular f/u with derm

## 2013-03-02 ENCOUNTER — Other Ambulatory Visit: Payer: Self-pay

## 2013-03-02 MED ORDER — METOPROLOL TARTRATE 25 MG PO TABS: 25.0000 mg | ORAL_TABLET | Freq: Two times a day (BID) | ORAL | Status: DC

## 2013-03-29 ENCOUNTER — Ambulatory Visit (INDEPENDENT_AMBULATORY_CARE_PROVIDER_SITE_OTHER): Payer: Medicare Other | Admitting: Cardiology

## 2013-03-29 ENCOUNTER — Encounter: Payer: Self-pay | Admitting: Cardiology

## 2013-03-29 VITALS — BP 118/64 | HR 52 | Ht 73.0 in | Wt 171.4 lb

## 2013-03-29 DIAGNOSIS — E785 Hyperlipidemia, unspecified: Secondary | ICD-10-CM | POA: Diagnosis not present

## 2013-03-29 DIAGNOSIS — I2510 Atherosclerotic heart disease of native coronary artery without angina pectoris: Secondary | ICD-10-CM

## 2013-03-29 DIAGNOSIS — I10 Essential (primary) hypertension: Secondary | ICD-10-CM | POA: Diagnosis not present

## 2013-03-29 NOTE — Patient Instructions (Signed)
Continue your current therapy  I will see you in 6 months.   

## 2013-03-29 NOTE — Progress Notes (Signed)
Joseph Simpson Date of Birth: Dec 08, 1934 Medical Record #[id number]  History of Present Illness: Joseph Simpson is seen today for a followup visit. He has had an abnormal stress test with abnormal EKG/runs of SVT and marked ST-T wave changes. Images were normal. Had cardiac cath showing minimal nonobstructive CAD and normal LV function. Managed medically with emphasis on BP control. On followup today he reports he is feeling very well. He stays active doing yard work but admits that he hasn't been doing his regular walking this past year because he's had care for his wife he has had a stroke. He is tolerating his medications well. Blood pressure has been well-controlled. He denies any chest pain, palpitations, or dyspnea.  Current Outpatient Prescriptions on File Prior to Visit  Medication Sig Dispense Refill  . acetaminophen (TYLENOL) 500 MG tablet Take 1,000 mg by mouth every 6 (six) hours as needed. PAIN        . bevacizumab (AVASTIN) 2.5 mg/0.1 mL SOLN 2.5 mg. Injection to eye every 8 weeks      . lisinopril (PRINIVIL,ZESTRIL) 40 MG tablet Take 40 mg by mouth every morning.        . metoCLOPramide (REGLAN) 10 MG tablet Take 10 mg by mouth 2 (two) times daily.        . metoprolol tartrate (LOPRESSOR) 25 MG tablet Take 1 tablet (25 mg total) by mouth 2 (two) times daily.  60 tablet  3  . pantoprazole (PROTONIX) 40 MG tablet Take 40 mg by mouth 2 (two) times daily.       Bertram Gala Glycol-Propyl Glycol (SYSTANE ULTRA) 0.4-0.3 % SOLN Apply 1 drop to eye 3 (three) times daily.        . Tamsulosin HCl (FLOMAX) 0.4 MG CAPS Take 0.4 mg by mouth as needed.       No current facility-administered medications on file prior to visit.    Allergies  Allergen Reactions  . Aspirin Other (See Comments)    UPSET STOMACH  . Percocet (Oxycodone-Acetaminophen) Nausea And Vomiting    Past Medical History  Diagnosis Date  . GERD (gastroesophageal reflux disease)   . Hypertension   . Syncope   . OSA  (obstructive sleep apnea)   . Melanoma   . Dyslipidemia   . Macular degeneration   . Abnormal stress test December 2013    s/p cardiac cath with minimal nonobstructive CAD and normal LV function; medical management recommended  . SVT (supraventricular tachycardia)   . CAD (coronary artery disease)     Past Surgical History  Procedure Laterality Date  . Gastrectomy    . Hemorrhoid surgery    . Melanoma excision  05-05-2007    LEFT FOREARM  . Nose surgery    . Cataract extraction    . Tonsillectomy    . Appendectomy    . Cystoscopy/retrograde/ureteroscopy/stone extraction with basket  02/24/2012    Procedure: CYSTOSCOPY/RETROGRADE/URETEROSCOPY/STONE EXTRACTION WITH BASKET;  Surgeon: Marcine Matar, MD;  Location: [facility];  Service: Urology;  Laterality: Bilateral;  1 HOUR  . Cardiac catheterization  December 2013    minimal nonobstructive CAD with normal LV function    History  Smoking status  . Former Smoker  . Quit date: 09/03/1956  Smokeless tobacco  . Not on file    History  Alcohol Use No    Family History  Problem Relation Age of Onset  . Heart disease Mother     CABG in her 18s  . Pneumonia Father   .  Cancer Sister     breast    Review of Systems: The review of systems is per the HPI.  All other systems were reviewed and are negative.  Physical Exam: BP 118/64  Pulse 52  Ht 6\' 1"  (1.854 m)  Wt 171 lb 6 oz (77.735 kg)  BMI 22.62 kg/m2 Patient is very pleasant and in no acute distress. Skin is warm and dry. Color is normal.  HEENT is unremarkable. Normocephalic/atraumatic. PERRL. Sclera are nonicteric. Neck is supple. No masses. No JVD. Lungs are clear. Cardiac exam shows a regular rate and rhythm. Abdomen is soft. Extremities are without edema. Gait and ROM are intact. No gross neurologic deficits noted.   LABORATORY DATA: Lab Results  Component Value Date   WBC 5.3 10/18/2012   HGB 15.9 10/18/2012   HCT 48.0 10/18/2012   PLT  134.0* 10/18/2012   GLUCOSE 91 10/18/2012   CHOL 167 09/04/2012   TRIG 99 09/04/2012   HDL 40 09/04/2012   LDLCALC 107* 09/04/2012   ALT 15 09/03/2012   AST 19 09/03/2012   NA 139 10/18/2012   K 3.9 10/18/2012   CL 100 10/18/2012   CREATININE 0.9 10/18/2012   BUN 12 10/18/2012   CO2 31 10/18/2012   TSH 1.734 09/03/2012   PSA 2.42 10/02/2010   INR 1.0 10/18/2012     Assessment / Plan: 1. CAD - minimal per recent cath. Managed medically. Doing well clinically.   2. HTN - improved with his current regimen. He will continue his current medications.  3. SVT/palpitations - asymptomatic on metoprolol.  I'll followup again in 6 months.

## 2013-04-04 ENCOUNTER — Other Ambulatory Visit: Payer: Self-pay | Admitting: Internal Medicine

## 2013-04-12 ENCOUNTER — Encounter (INDEPENDENT_AMBULATORY_CARE_PROVIDER_SITE_OTHER): Payer: Medicare Other | Admitting: Ophthalmology

## 2013-04-17 DIAGNOSIS — N401 Enlarged prostate with lower urinary tract symptoms: Secondary | ICD-10-CM | POA: Diagnosis not present

## 2013-04-17 DIAGNOSIS — N210 Calculus in bladder: Secondary | ICD-10-CM | POA: Diagnosis not present

## 2013-04-17 DIAGNOSIS — N138 Other obstructive and reflux uropathy: Secondary | ICD-10-CM | POA: Diagnosis not present

## 2013-04-17 DIAGNOSIS — N200 Calculus of kidney: Secondary | ICD-10-CM | POA: Diagnosis not present

## 2013-04-18 ENCOUNTER — Encounter (INDEPENDENT_AMBULATORY_CARE_PROVIDER_SITE_OTHER): Payer: Medicare Other | Admitting: Ophthalmology

## 2013-04-18 DIAGNOSIS — D3130 Benign neoplasm of unspecified choroid: Secondary | ICD-10-CM

## 2013-04-18 DIAGNOSIS — I10 Essential (primary) hypertension: Secondary | ICD-10-CM | POA: Diagnosis not present

## 2013-04-18 DIAGNOSIS — H43819 Vitreous degeneration, unspecified eye: Secondary | ICD-10-CM

## 2013-04-18 DIAGNOSIS — H2510 Age-related nuclear cataract, unspecified eye: Secondary | ICD-10-CM

## 2013-04-18 DIAGNOSIS — H35039 Hypertensive retinopathy, unspecified eye: Secondary | ICD-10-CM | POA: Diagnosis not present

## 2013-04-18 DIAGNOSIS — H353290 Exudative age-related macular degeneration, unspecified eye, stage unspecified: Secondary | ICD-10-CM

## 2013-06-20 ENCOUNTER — Encounter (INDEPENDENT_AMBULATORY_CARE_PROVIDER_SITE_OTHER): Payer: Medicare Other | Admitting: Ophthalmology

## 2013-06-20 DIAGNOSIS — D3130 Benign neoplasm of unspecified choroid: Secondary | ICD-10-CM | POA: Diagnosis not present

## 2013-06-20 DIAGNOSIS — H35039 Hypertensive retinopathy, unspecified eye: Secondary | ICD-10-CM

## 2013-06-20 DIAGNOSIS — I10 Essential (primary) hypertension: Secondary | ICD-10-CM

## 2013-06-20 DIAGNOSIS — H2510 Age-related nuclear cataract, unspecified eye: Secondary | ICD-10-CM

## 2013-06-20 DIAGNOSIS — H353290 Exudative age-related macular degeneration, unspecified eye, stage unspecified: Secondary | ICD-10-CM | POA: Diagnosis not present

## 2013-06-28 ENCOUNTER — Other Ambulatory Visit: Payer: Self-pay

## 2013-06-28 MED ORDER — METOPROLOL TARTRATE 25 MG PO TABS: 25.0000 mg | ORAL_TABLET | Freq: Two times a day (BID) | ORAL | Status: DC

## 2013-07-11 ENCOUNTER — Telehealth: Payer: Self-pay | Admitting: Cardiology

## 2013-07-11 DIAGNOSIS — K2100 Gastro-esophageal reflux disease with esophagitis, without bleeding: Secondary | ICD-10-CM | POA: Diagnosis not present

## 2013-07-11 DIAGNOSIS — K219 Gastro-esophageal reflux disease without esophagitis: Secondary | ICD-10-CM | POA: Diagnosis not present

## 2013-07-11 DIAGNOSIS — K3184 Gastroparesis: Secondary | ICD-10-CM | POA: Diagnosis not present

## 2013-07-11 DIAGNOSIS — K5900 Constipation, unspecified: Secondary | ICD-10-CM | POA: Diagnosis not present

## 2013-07-12 NOTE — Telephone Encounter (Signed)
New Problem  Pt went to get a colonoscopy, but his heart was beating too slow, so he couldn't go through with the procedure/ wants to know if his medication needs to be reduced.

## 2013-07-18 NOTE — Telephone Encounter (Signed)
Returned call to patient he stated he went to have colonoscopy last week with Dr.McMurchy and he was unable to have colonoscopy due to slow heart beat.Stated heart rate in the 40's.Stated he has had a couple of days in which he was dizzy, but dizziness went away.Stated appointment was scheduled with Tereso Newcomer PA 07/25/13.Stated wanted to know if metoprolol needs to be decreased.Dr.Jordan out of office today will check with him tomorrow 07/19/13 and call him back.

## 2013-07-19 NOTE — Telephone Encounter (Signed)
Returned call to patient spoke to Dr.Jordan he advised may decrease metoprolol to 12.5 mg twice a day and keep appointment with Tereso Newcomer PA 07/25/13.

## 2013-07-25 ENCOUNTER — Encounter: Payer: Self-pay | Admitting: Physician Assistant

## 2013-07-25 ENCOUNTER — Ambulatory Visit (INDEPENDENT_AMBULATORY_CARE_PROVIDER_SITE_OTHER): Payer: Medicare Other | Admitting: Physician Assistant

## 2013-07-25 VITALS — BP 146/69 | HR 49 | Ht 73.0 in | Wt 168.0 lb

## 2013-07-25 DIAGNOSIS — I498 Other specified cardiac arrhythmias: Secondary | ICD-10-CM | POA: Diagnosis not present

## 2013-07-25 DIAGNOSIS — R5383 Other fatigue: Secondary | ICD-10-CM

## 2013-07-25 DIAGNOSIS — I10 Essential (primary) hypertension: Secondary | ICD-10-CM | POA: Diagnosis not present

## 2013-07-25 DIAGNOSIS — R5381 Other malaise: Secondary | ICD-10-CM | POA: Diagnosis not present

## 2013-07-25 NOTE — Progress Notes (Signed)
[address] [place], [place]  [address] Phone: [phone] Fax:  [phone]  Date:  07/25/2013   ID:  Joseph Simpson, DOB Feb 27, 1935, MRN [id number]  PCP:  Judie Petit, MD  Cardiologist:  Dr. Peter Swaziland     History of Present Illness: Joseph Simpson is a 78 y.o. male who returns for the evaluation of bradycardia.  He has a hx of HTN and minimal, nonobstructive CAD by cardiac catheterization. He underwent ETT-Myoview 09/2012 for chest pain. This was abnormal with positive ECG changes and a run of SVT. Perfusion images were normal. EF was 58%. Cardiac catheterization was recommended. LHC 10/2012: LAD 10-20%, circumflex 10-20%, EF 55-65%. Medical therapy was recommended. Last seen by Dr. Swaziland 03/2013.  He was to have a screening colo with Dr. Nonda Lou in [place] recently.  But, this was cancelled due to bradycardia (HRs in the 40s).  He called here and was told to decrease his Metoprolol to 12.5 mg bid.  He notes a long hx of exercise intolerance.  States that mowing the yard will "wear me out."  He notes occasional dizziness and lightheadedness.  No syncope or near syncope.  He does have a hx of unexplained syncope many years ago.  No CP.  No significant DOE.  No orthopnea, PND, edema.    Labs (10/13):  K 4.3, creatinine 0.74, ALT 15, LDL 107, HDL 40, TSH 1.734 Labs (12/13):  K 3.9, creatinine 0.9, Hgb 15.9  Wt Readings from Last 3 Encounters:  07/25/13 168 lb (76.204 kg)  03/29/13 171 lb 6 oz (77.735 kg)  02/20/13 176 lb (79.833 kg)     Past Medical History  Diagnosis Date  . GERD (gastroesophageal reflux disease)   . Hypertension   . Syncope   . OSA (obstructive sleep apnea)   . Melanoma   . Dyslipidemia   . Macular degeneration   . Abnormal stress test December 2013    s/p cardiac cath with minimal nonobstructive CAD and normal LV function; medical management recommended  . SVT (supraventricular tachycardia)   . CAD (coronary artery  disease)     Current Outpatient Prescriptions  Medication Sig Dispense Refill  . acetaminophen (TYLENOL) 500 MG tablet Take 1,000 mg by mouth every 6 (six) hours as needed. PAIN        . bevacizumab (AVASTIN) 2.5 mg/0.1 mL SOLN 2.5 mg. Injection to eye every 8 weeks      . lisinopril (PRINIVIL,ZESTRIL) 40 MG tablet take 1 tablet by mouth once daily  90 tablet  1  . metoCLOPramide (REGLAN) 5 MG tablet Take 5 mg by mouth 2 (two) times daily.      . metoprolol tartrate (LOPRESSOR) 25 MG tablet Take 1/2 tablet ( 12.5 mg ) twice a day  60 tablet  6  . pantoprazole (PROTONIX) 40 MG tablet Take 40 mg by mouth daily.       Bertram Gala Glycol-Propyl Glycol (SYSTANE ULTRA) 0.4-0.3 % SOLN Apply 1 drop to eye 3 (three) times daily.        . Tamsulosin HCl (FLOMAX) 0.4 MG CAPS Take 0.4 mg by mouth as needed.       No current facility-administered medications for this visit.    Allergies:    Allergies  Allergen Reactions  . Aspirin Other (See Comments)    UPSET STOMACH  . Percocet [Oxycodone-Acetaminophen] Nausea And Vomiting    Social History:  The patient  reports that he quit smoking about 56 years ago.  He does not have any smokeless tobacco history on file. He reports that he does not drink alcohol or use illicit drugs.   ROS:  Please see the history of present illness.      All other systems reviewed and negative.   PHYSICAL EXAM: VS:  BP 146/69  Pulse 49  Ht 6\' 1"  (1.854 m)  Wt 168 lb (76.204 kg)  BMI 22.17 kg/m2 Well nourished, well developed, in no acute distress HEENT: normal Neck: no JVD Endocrine:  No TM Cardiac:  normal S1, S2; RRR; no murmur Lungs:  clear to auscultation bilaterally, no wheezing, rhonchi or rales Abd: soft, nontender, no hepatomegaly Ext: no edema Skin: warm and dry Neuro:  CNs 2-12 intact, no focal abnormalities noted  EKG:  Sinus brady, HR 51, normal axis, NSSTTW changes     ASSESSMENT AND PLAN:  1. Bradycardia:  I suspect he is symptomatic with  this.  He has exercise intolerance and occasional dizziness.  I will taper him off Metoprolol and stop it.  I will set him up for a 48 hr Holter after he has been off the metoprolol for 2 weeks.  If he has significant pauses or marked bradycardia, he may need referral to EP for consideration of Pacemaker.  If his HR is improved and he feels better, he will likely not need further workup.   2. Hypertension:  He may need the addition of another medication for BP with the d/c of metoprolol.  BP will be monitored. 3. CAD:  Minimal by recent LHC.  4. Disposition:  F/u with Dr. Peter Swaziland in 4-6 weeks.   Signed, Tereso Newcomer, PA-C  07/25/2013 4:18 PM

## 2013-07-25 NOTE — Patient Instructions (Addendum)
Take Metoprolol 25 mg 1/2 tablet today and 1/2 tablet tomorrow (Wednesday). Then stop taking Metoprolol. If you have heart palpitations that are so fast you cannot count them, you can take a 1/2 of Metoprolol on that day only and call our office for further instructions.  Your physician has recommended that you wear a holter monitor. Holter monitors are medical devices that record the heart's electrical activity. Doctors most often use these monitors to diagnose arrhythmias. Arrhythmias are problems with the speed or rhythm of the heartbeat. The monitor is a small, portable device. You can wear one while you do your normal daily activities. This is usually used to diagnose what is causing palpitations/syncope (passing out).  PLEASE SCHEDULE HOLTER PLACEMENT AFTER 08/10/13, 2 WEEKS AFTER D/C METOPROLOL  Your physician recommends that you schedule a follow-up appointment in:  4-6 WEEKS WITH DR.JORDAN

## 2013-08-14 ENCOUNTER — Encounter (INDEPENDENT_AMBULATORY_CARE_PROVIDER_SITE_OTHER): Payer: Medicare Other

## 2013-08-14 ENCOUNTER — Encounter: Payer: Self-pay | Admitting: *Deleted

## 2013-08-14 DIAGNOSIS — R5381 Other malaise: Secondary | ICD-10-CM | POA: Diagnosis not present

## 2013-08-14 DIAGNOSIS — R5383 Other fatigue: Secondary | ICD-10-CM

## 2013-08-14 DIAGNOSIS — I498 Other specified cardiac arrhythmias: Secondary | ICD-10-CM | POA: Diagnosis not present

## 2013-08-14 NOTE — Progress Notes (Signed)
Patient ID: Joseph Simpson, male   DOB: 09/07/1935, 78 y.o.   MRN: [id number] E-Cardio 48 hour holter monitor applied to patient.

## 2013-08-17 ENCOUNTER — Other Ambulatory Visit: Payer: Self-pay | Admitting: Dermatology

## 2013-08-17 DIAGNOSIS — D239 Other benign neoplasm of skin, unspecified: Secondary | ICD-10-CM | POA: Diagnosis not present

## 2013-08-17 DIAGNOSIS — D485 Neoplasm of uncertain behavior of skin: Secondary | ICD-10-CM | POA: Diagnosis not present

## 2013-08-17 DIAGNOSIS — Z85828 Personal history of other malignant neoplasm of skin: Secondary | ICD-10-CM | POA: Diagnosis not present

## 2013-08-17 DIAGNOSIS — Z85820 Personal history of malignant melanoma of skin: Secondary | ICD-10-CM | POA: Diagnosis not present

## 2013-08-17 DIAGNOSIS — L821 Other seborrheic keratosis: Secondary | ICD-10-CM | POA: Diagnosis not present

## 2013-08-17 DIAGNOSIS — L570 Actinic keratosis: Secondary | ICD-10-CM | POA: Diagnosis not present

## 2013-08-22 ENCOUNTER — Encounter (INDEPENDENT_AMBULATORY_CARE_PROVIDER_SITE_OTHER): Payer: Medicare Other | Admitting: Ophthalmology

## 2013-08-22 DIAGNOSIS — I10 Essential (primary) hypertension: Secondary | ICD-10-CM

## 2013-08-22 DIAGNOSIS — H35039 Hypertensive retinopathy, unspecified eye: Secondary | ICD-10-CM | POA: Diagnosis not present

## 2013-08-22 DIAGNOSIS — H353290 Exudative age-related macular degeneration, unspecified eye, stage unspecified: Secondary | ICD-10-CM | POA: Diagnosis not present

## 2013-08-22 DIAGNOSIS — H43819 Vitreous degeneration, unspecified eye: Secondary | ICD-10-CM

## 2013-08-25 ENCOUNTER — Ambulatory Visit (INDEPENDENT_AMBULATORY_CARE_PROVIDER_SITE_OTHER): Payer: Medicare Other | Admitting: Internal Medicine

## 2013-08-25 ENCOUNTER — Encounter: Payer: Self-pay | Admitting: Internal Medicine

## 2013-08-25 ENCOUNTER — Telehealth: Payer: Self-pay

## 2013-08-25 VITALS — BP 110/70 | HR 60 | Temp 98.0°F | Wt 168.0 lb

## 2013-08-25 DIAGNOSIS — E785 Hyperlipidemia, unspecified: Secondary | ICD-10-CM

## 2013-08-25 DIAGNOSIS — Z23 Encounter for immunization: Secondary | ICD-10-CM | POA: Diagnosis not present

## 2013-08-25 DIAGNOSIS — C4360 Malignant melanoma of unspecified upper limb, including shoulder: Secondary | ICD-10-CM

## 2013-08-25 DIAGNOSIS — I2510 Atherosclerotic heart disease of native coronary artery without angina pectoris: Secondary | ICD-10-CM | POA: Diagnosis not present

## 2013-08-25 DIAGNOSIS — R011 Cardiac murmur, unspecified: Secondary | ICD-10-CM | POA: Diagnosis not present

## 2013-08-25 DIAGNOSIS — N200 Calculus of kidney: Secondary | ICD-10-CM

## 2013-08-25 LAB — HEPATIC FUNCTION PANEL
ALT: 21 U/L (ref 0–53)
AST: 23 U/L (ref 0–37)
Albumin: 4.2 g/dL (ref 3.5–5.2)
Alkaline Phosphatase: 52 U/L (ref 39–117)
Bilirubin, Direct: 0.1 mg/dL (ref 0.0–0.3)
Total Bilirubin: 1.4 mg/dL — ABNORMAL HIGH (ref 0.3–1.2)
Total Protein: 7.1 g/dL (ref 6.0–8.3)

## 2013-08-25 LAB — LIPID PANEL
Cholesterol: 197 mg/dL (ref 0–200)
HDL: 42.9 mg/dL (ref >39.00)
LDL Cholesterol: 134 mg/dL — ABNORMAL HIGH (ref 0–99)
Total CHOL/HDL Ratio: 5
Triglycerides: 101 mg/dL (ref 0.0–149.0)
VLDL: 20.2 mg/dL (ref 0.0–40.0)

## 2013-08-25 LAB — CBC WITH DIFFERENTIAL/PLATELET
Basophils Absolute: 0 10*3/uL (ref 0.0–0.1)
Basophils Relative: 0.4 % (ref 0.0–3.0)
Eosinophils Absolute: 0.2 10*3/uL (ref 0.0–0.7)
Eosinophils Relative: 3.1 % (ref 0.0–5.0)
HCT: 44.9 % (ref 39.0–52.0)
Hemoglobin: 15 g/dL (ref 13.0–17.0)
Lymphocytes Relative: 22.7 % (ref 12.0–46.0)
Lymphs Abs: 1.3 10*3/uL (ref 0.7–4.0)
MCHC: 33.4 g/dL (ref 30.0–36.0)
MCV: 94.3 fl (ref 78.0–100.0)
Monocytes Absolute: 0.6 10*3/uL (ref 0.1–1.0)
Monocytes Relative: 9.9 % (ref 3.0–12.0)
Neutro Abs: 3.6 10*3/uL (ref 1.4–7.7)
Neutrophils Relative %: 63.9 % (ref 43.0–77.0)
Platelets: 143 10*3/uL — ABNORMAL LOW (ref 150.0–400.0)
RBC: 4.76 Mil/uL (ref 4.22–5.81)
RDW: 13.4 % (ref 11.5–14.6)
WBC: 5.7 10*3/uL (ref 4.5–10.5)

## 2013-08-25 LAB — BASIC METABOLIC PANEL
BUN: 15 mg/dL (ref 6–23)
CO2: 32 mEq/L (ref 19–32)
Calcium: 9.4 mg/dL (ref 8.4–10.5)
Chloride: 104 mEq/L (ref 96–112)
Creatinine, Ser: 0.8 mg/dL (ref 0.4–1.5)
GFR: 98.27 mL/min (ref >60.00)
Glucose, Bld: 104 mg/dL — ABNORMAL HIGH (ref 70–99)
Potassium: 3.7 mEq/L (ref 3.5–5.1)
Sodium: 144 mEq/L (ref 135–145)

## 2013-08-25 LAB — TSH: TSH: 2 u[IU]/mL (ref 0.35–5.50)

## 2013-08-25 NOTE — Telephone Encounter (Signed)
Patient called Dr.Jordan reviewed 48 hr holter monitor which revealed occasional pvc's with couplets,rare pac's.Advised to continue metoprolol 12.5 mg twice a day.

## 2013-08-25 NOTE — Progress Notes (Signed)
Wife is hospice patient (metastatic lung CA)  CAD- no sxs now. Note bradycardia evaluation-- stopped metoprolol  htn- tolerating meds  GERD- no sxs on PPI  Past Medical History  Diagnosis Date  . GERD (gastroesophageal reflux disease)   . Hypertension   . Syncope   . OSA (obstructive sleep apnea)   . Melanoma   . Dyslipidemia   . Macular degeneration   . Abnormal stress test December 2013    s/p cardiac cath with minimal nonobstructive CAD and normal LV function; medical management recommended  . SVT (supraventricular tachycardia)   . CAD (coronary artery disease)     History   Social History  . Marital Status: Married    Spouse Name: N/A    Number of Children: 1  . Years of Education: N/A   Occupational History  . Not on file.   Social History Main Topics  . Smoking status: Former Smoker    Quit date: 09/03/1956  . Smokeless tobacco: Not on file  . Alcohol Use: No  . Drug Use: No  . Sexual Activity: Yes   Other Topics Concern  . Not on file   Social History Narrative  . No narrative on file    Past Surgical History  Procedure Laterality Date  . Gastrectomy    . Hemorrhoid surgery    . Melanoma excision  05-05-2007    LEFT FOREARM  . Nose surgery    . Cataract extraction    . Tonsillectomy    . Appendectomy    . Cystoscopy/retrograde/ureteroscopy/stone extraction with basket  02/24/2012    Procedure: CYSTOSCOPY/RETROGRADE/URETEROSCOPY/STONE EXTRACTION WITH BASKET;  Surgeon: Marcine Matar, MD;  Location: [facility];  Service: Urology;  Laterality: Bilateral;  1 HOUR  . Cardiac catheterization  December 2013    minimal nonobstructive CAD with normal LV function    Family History  Problem Relation Age of Onset  . Heart disease Mother     CABG in her 66s  . Pneumonia Father   . Cancer Sister     breast    Allergies  Allergen Reactions  . Aspirin Other (See Comments)    UPSET STOMACH  . Percocet [Oxycodone-Acetaminophen]  Nausea And Vomiting    Current Outpatient Prescriptions on File Prior to Visit  Medication Sig Dispense Refill  . acetaminophen (TYLENOL) 500 MG tablet Take 1,000 mg by mouth every 6 (six) hours as needed. PAIN        . bevacizumab (AVASTIN) 2.5 mg/0.1 mL SOLN 2.5 mg. Injection to eye every 8 weeks      . lisinopril (PRINIVIL,ZESTRIL) 40 MG tablet take 1 tablet by mouth once daily  90 tablet  1  . metoCLOPramide (REGLAN) 5 MG tablet Take 5 mg by mouth 2 (two) times daily.      . pantoprazole (PROTONIX) 40 MG tablet Take 40 mg by mouth daily.       Bertram Gala Glycol-Propyl Glycol (SYSTANE ULTRA) 0.4-0.3 % SOLN Apply 1 drop to eye 3 (three) times daily.        . Tamsulosin HCl (FLOMAX) 0.4 MG CAPS Take 0.4 mg by mouth as needed.       No current facility-administered medications on file prior to visit.     patient denies chest pain, shortness of breath, orthopnea. Denies lower extremity edema, abdominal pain, change in appetite, change in bowel movements. Patient denies rashes, musculoskeletal complaints. No other specific complaints in a complete review of systems.   BP 110/70  Pulse 60  Temp(Src) 98 F (36.7 C) (Oral)  Wt 168 lb (76.204 kg)  BMI 22.17 kg/m2  well-developed well-nourished male in no acute distress. HEENT exam atraumatic, normocephalic, neck supple without jugular venous distention. Chest clear to auscultation cardiac exam S1-S2 are regular. Abdominal exam overweight with bowel sounds, soft and nontender. Extremities no edema. Neurologic exam is alert with a normal gait.

## 2013-08-27 NOTE — Assessment & Plan Note (Signed)
No sxs- continue risk factor modification 

## 2013-08-27 NOTE — Assessment & Plan Note (Signed)
Lipid Panel     Component Value Date/Time   CHOL 197 08/25/2013 1011   TRIG 101.0 08/25/2013 1011   HDL 42.90 08/25/2013 1011   CHOLHDL 5 08/25/2013 1011   VLDL 20.2 08/25/2013 1011   LDLCALC 134* 08/25/2013 1011   Has minimal CAD- will aim for ldl less than 100

## 2013-09-27 ENCOUNTER — Encounter: Payer: Self-pay | Admitting: Cardiology

## 2013-09-27 ENCOUNTER — Other Ambulatory Visit: Payer: Self-pay | Admitting: *Deleted

## 2013-09-27 ENCOUNTER — Ambulatory Visit (INDEPENDENT_AMBULATORY_CARE_PROVIDER_SITE_OTHER): Payer: Medicare Other | Admitting: Cardiology

## 2013-09-27 VITALS — BP 114/66 | HR 71 | Ht 73.0 in | Wt 175.0 lb

## 2013-09-27 DIAGNOSIS — I2510 Atherosclerotic heart disease of native coronary artery without angina pectoris: Secondary | ICD-10-CM

## 2013-09-27 DIAGNOSIS — I493 Ventricular premature depolarization: Secondary | ICD-10-CM

## 2013-09-27 DIAGNOSIS — I4949 Other premature depolarization: Secondary | ICD-10-CM

## 2013-09-27 DIAGNOSIS — I10 Essential (primary) hypertension: Secondary | ICD-10-CM

## 2013-09-27 DIAGNOSIS — E785 Hyperlipidemia, unspecified: Secondary | ICD-10-CM | POA: Diagnosis not present

## 2013-09-27 HISTORY — DX: Ventricular premature depolarization: I49.3

## 2013-09-27 MED ORDER — METOPROLOL TARTRATE 25 MG PO TABS: 12.5000 mg | ORAL_TABLET | Freq: Two times a day (BID) | ORAL | Status: DC

## 2013-09-27 NOTE — Progress Notes (Signed)
Theone Stanley Date of Birth: [birth year] Medical Record #[id number]  History of Present Illness: Mr. Fishbaugh is seen today for a followup visit. He has had an abnormal stress test with abnormal EKG/runs of SVT and marked ST-T wave changes. Images were normal. Had cardiac cath showing minimal nonobstructive CAD and normal LV function. Managed medically with emphasis on BP control. He was seen earlier this month with symptoms of fatigue. He was noted to be bradycardic with heart rate of 42 beats per minute. He was scheduled for colonoscopy but this was canceled. His metoprolol was reduced to 12.5 mg twice a day. Since then he has noticed his heart rate has increased. He does complain of symptoms with exertion such as mowing the yard. He notes skipping in his heartbeat and this makes him feel care for 2-3 days. He did wear a 24-hour Holter monitor which demonstrated occasional PVCs and rare PACs. He had no significant pauses or bradycardia. His minimal heart rate was 55 beats per minute with a mean heart rate of 84 beats per minute.  Current Outpatient Prescriptions on File Prior to Visit  Medication Sig Dispense Refill  . acetaminophen (TYLENOL) 500 MG tablet Take 1,000 mg by mouth every 6 (six) hours as needed. PAIN        . bevacizumab (AVASTIN) 2.5 mg/0.1 mL SOLN 2.5 mg. Injection to eye every 8 weeks      . lisinopril (PRINIVIL,ZESTRIL) 40 MG tablet take 1 tablet by mouth once daily  90 tablet  1  . metoCLOPramide (REGLAN) 5 MG tablet Take 5 mg by mouth 2 (two) times daily.      . pantoprazole (PROTONIX) 40 MG tablet Take 40 mg by mouth daily.       Bertram Gala Glycol-Propyl Glycol (SYSTANE ULTRA) 0.4-0.3 % SOLN Apply 1 drop to eye 3 (three) times daily.        Marland Kitchen VIGAMOX 0.5 % ophthalmic solution Place 1 drop into both eyes. Only when you get shot for eyes       No current facility-administered medications on file prior to visit.    Allergies  Allergen Reactions  . Aspirin Other (See  Comments)    UPSET STOMACH  . Percocet [Oxycodone-Acetaminophen] Nausea And Vomiting    Past Medical History  Diagnosis Date  . GERD (gastroesophageal reflux disease)   . Hypertension   . Syncope   . OSA (obstructive sleep apnea)   . Melanoma   . Dyslipidemia   . Macular degeneration   . Abnormal stress test December 2013    s/p cardiac cath with minimal nonobstructive CAD and normal LV function; medical management recommended  . SVT (supraventricular tachycardia)   . CAD (coronary artery disease)   . PVC's (premature ventricular contractions) 09/27/2013    Past Surgical History  Procedure Laterality Date  . Gastrectomy    . Hemorrhoid surgery    . Melanoma excision  05-05-2007    LEFT FOREARM  . Nose surgery    . Cataract extraction    . Tonsillectomy    . Appendectomy    . Cystoscopy/retrograde/ureteroscopy/stone extraction with basket  02/24/2012    Procedure: CYSTOSCOPY/RETROGRADE/URETEROSCOPY/STONE EXTRACTION WITH BASKET;  Surgeon: Marcine Matar, MD;  Location: [facility];  Service: Urology;  Laterality: Bilateral;  1 HOUR  . Cardiac catheterization  December 2013    minimal nonobstructive CAD with normal LV function    History  Smoking status  . Former Smoker  . Quit date: 09/03/1956  Smokeless tobacco  .  Not on file    History  Alcohol Use No    Family History  Problem Relation Age of Onset  . Heart disease Mother     CABG in her 5s  . Pneumonia Father   . Cancer Sister     breast    Review of Systems: The review of systems is per the HPI.  All other systems were reviewed and are negative.  Physical Exam: BP 114/66  Pulse 71  Ht 6\' 1"  (1.854 m)  Wt 175 lb (79.379 kg)  BMI 23.09 kg/m2 Patient is very pleasant and in no acute distress. Skin is warm and dry. Color is normal.  HEENT is unremarkable. Normocephalic/atraumatic. PERRL. Sclera are nonicteric. Neck is supple. No masses. No JVD. Lungs are clear. Cardiac exam shows a  regular rate and rhythm. Abdomen is soft. Extremities are without edema. Gait and ROM are intact. No gross neurologic deficits noted.   LABORATORY DATA: Lab Results  Component Value Date   WBC 5.7 08/25/2013   HGB 15.0 08/25/2013   HCT 44.9 08/25/2013   PLT 143.0* 08/25/2013   GLUCOSE 104* 08/25/2013   CHOL 197 08/25/2013   TRIG 101.0 08/25/2013   HDL 42.90 08/25/2013   LDLCALC 134* 08/25/2013   ALT 21 08/25/2013   AST 23 08/25/2013   NA 144 08/25/2013   K 3.7 08/25/2013   CL 104 08/25/2013   CREATININE 0.8 08/25/2013   BUN 15 08/25/2013   CO2 32 08/25/2013   TSH 2.00 08/25/2013   PSA 2.42 10/02/2010   INR 1.0 10/18/2012     Assessment / Plan: 1. CAD - minimal nonobstructive disease by cath. Managed medically. Doing well clinically.   2. HTN - well controlled on current therapy.  3. SVT/bradycardia/PVCs- patient does have more exertional symptoms with reduction in his beta blocker dose. I recommended increasing his metoprolol to 25 mg in the morning and 12.5 mg in the evening. He is cleared to proceed with his colonoscopy.  I'll followup again in 6 months.

## 2013-09-27 NOTE — Patient Instructions (Signed)
Increase metoprolol to 25 mg in the morning and 12.5 mg in the evening.  You are clear to proceed with colonoscopy.  I will see you in 6 months.

## 2013-10-02 ENCOUNTER — Other Ambulatory Visit: Payer: Self-pay | Admitting: Internal Medicine

## 2013-10-17 ENCOUNTER — Encounter (INDEPENDENT_AMBULATORY_CARE_PROVIDER_SITE_OTHER): Payer: Medicare Other | Admitting: Ophthalmology

## 2013-10-17 DIAGNOSIS — H35039 Hypertensive retinopathy, unspecified eye: Secondary | ICD-10-CM

## 2013-10-17 DIAGNOSIS — H353290 Exudative age-related macular degeneration, unspecified eye, stage unspecified: Secondary | ICD-10-CM

## 2013-10-17 DIAGNOSIS — I10 Essential (primary) hypertension: Secondary | ICD-10-CM | POA: Diagnosis not present

## 2013-10-17 DIAGNOSIS — D3130 Benign neoplasm of unspecified choroid: Secondary | ICD-10-CM | POA: Diagnosis not present

## 2013-10-17 DIAGNOSIS — H2510 Age-related nuclear cataract, unspecified eye: Secondary | ICD-10-CM

## 2013-10-17 DIAGNOSIS — H43819 Vitreous degeneration, unspecified eye: Secondary | ICD-10-CM

## 2013-10-18 DIAGNOSIS — N210 Calculus in bladder: Secondary | ICD-10-CM | POA: Diagnosis not present

## 2013-11-06 DIAGNOSIS — L259 Unspecified contact dermatitis, unspecified cause: Secondary | ICD-10-CM | POA: Diagnosis not present

## 2013-11-16 LAB — HM COLONOSCOPY: HM Colonoscopy: NORMAL

## 2013-12-12 ENCOUNTER — Encounter (INDEPENDENT_AMBULATORY_CARE_PROVIDER_SITE_OTHER): Payer: Medicare Other | Admitting: Ophthalmology

## 2013-12-12 DIAGNOSIS — H353290 Exudative age-related macular degeneration, unspecified eye, stage unspecified: Secondary | ICD-10-CM

## 2013-12-12 DIAGNOSIS — H35039 Hypertensive retinopathy, unspecified eye: Secondary | ICD-10-CM | POA: Diagnosis not present

## 2013-12-12 DIAGNOSIS — I10 Essential (primary) hypertension: Secondary | ICD-10-CM | POA: Diagnosis not present

## 2013-12-12 DIAGNOSIS — D3130 Benign neoplasm of unspecified choroid: Secondary | ICD-10-CM | POA: Diagnosis not present

## 2013-12-19 DIAGNOSIS — D126 Benign neoplasm of colon, unspecified: Secondary | ICD-10-CM | POA: Diagnosis not present

## 2013-12-19 DIAGNOSIS — Z86010 Personal history of colonic polyps: Secondary | ICD-10-CM | POA: Diagnosis not present

## 2014-01-22 ENCOUNTER — Ambulatory Visit: Payer: Medicare Other | Admitting: Internal Medicine

## 2014-01-28 NOTE — Progress Notes (Signed)
Cad- no sxs of CP, SOB, PND  htn- no home bps but he does take his meds  Melanoma- has regular f/u with dermatology  Past Medical History  Diagnosis Date  . GERD (gastroesophageal reflux disease)   . Hypertension   . Syncope   . OSA (obstructive sleep apnea)   . Melanoma   . Dyslipidemia   . Macular degeneration   . Abnormal stress test December 2013    s/p cardiac cath with minimal nonobstructive CAD and normal LV function; medical management recommended  . SVT (supraventricular tachycardia)   . CAD (coronary artery disease)   . PVC's (premature ventricular contractions) 09/27/2013    History   Social History  . Marital Status: Married    Spouse Name: N/A    Number of Children: 1  . Years of Education: N/A   Occupational History  . Not on file.   Social History Main Topics  . Smoking status: Former Smoker    Quit date: 09/03/1956  . Smokeless tobacco: Not on file  . Alcohol Use: No  . Drug Use: No  . Sexual Activity: Yes   Other Topics Concern  . Not on file   Social History Narrative  . No narrative on file    Past Surgical History  Procedure Laterality Date  . Gastrectomy    . Hemorrhoid surgery    . Melanoma excision  05-05-2007    LEFT FOREARM  . Nose surgery    . Cataract extraction    . Tonsillectomy    . Appendectomy    . Cystoscopy/retrograde/ureteroscopy/stone extraction with basket  02/24/2012    Procedure: CYSTOSCOPY/RETROGRADE/URETEROSCOPY/STONE EXTRACTION WITH BASKET;  Surgeon: Franchot Gallo, MD;  Location: [facility];  Service: Urology;  Laterality: Bilateral;  1 HOUR  . Cardiac catheterization  December 2013    minimal nonobstructive CAD with normal LV function    Family History  Problem Relation Age of Onset  . Heart disease Mother     CABG in her 64s  . Pneumonia Father   . Cancer Sister     breast    Allergies  Allergen Reactions  . Aspirin Other (See Comments)    UPSET STOMACH  . Percocet  [Oxycodone-Acetaminophen] Nausea And Vomiting    Current Outpatient Prescriptions on File Prior to Visit  Medication Sig Dispense Refill  . acetaminophen (TYLENOL) 500 MG tablet Take 1,000 mg by mouth every 6 (six) hours as needed. PAIN        . bevacizumab (AVASTIN) 2.5 mg/0.1 mL SOLN 2.5 mg. Injection to eye every 8 weeks      . lisinopril (PRINIVIL,ZESTRIL) 40 MG tablet take 1 tablet by mouth once daily  90 tablet  1  . metoCLOPramide (REGLAN) 5 MG tablet Take 5 mg by mouth 2 (two) times daily.      . metoprolol tartrate (LOPRESSOR) 25 MG tablet Take 0.5 tablets (12.5 mg total) by mouth 2 (two) times daily.  180 tablet  3  . pantoprazole (PROTONIX) 40 MG tablet Take 40 mg by mouth daily.       [name] Glycol-Propyl Glycol (SYSTANE ULTRA) 0.4-0.3 % SOLN Apply 1 drop to eye 3 (three) times daily.        Marland Kitchen VIGAMOX 0.5 % ophthalmic solution Place 1 drop into both eyes. Only when you get shot for eyes       No current facility-administered medications on file prior to visit.     patient denies chest pain, shortness of breath, orthopnea. Denies  lower extremity edema, abdominal pain, change in appetite, change in bowel movements. Patient denies rashes, musculoskeletal complaints. No other specific complaints in a complete review of systems.    Reviewed vitals  well-developed well-nourished male in no acute distress. HEENT exam atraumatic, normocephalic, neck supple without jugular venous distention. Chest clear to auscultation cardiac exam S1-S2 are regular. Abdominal exam overweight with bowel sounds, soft and nontender. Extremities no edema. Neurologic exam is alert with a normal gait.

## 2014-01-29 ENCOUNTER — Ambulatory Visit (INDEPENDENT_AMBULATORY_CARE_PROVIDER_SITE_OTHER): Payer: Medicare Other | Admitting: Internal Medicine

## 2014-01-29 ENCOUNTER — Encounter: Payer: Self-pay | Admitting: Internal Medicine

## 2014-01-29 VITALS — BP 136/84 | HR 72 | Temp 97.8°F | Ht 73.0 in | Wt 184.0 lb

## 2014-01-29 DIAGNOSIS — I10 Essential (primary) hypertension: Secondary | ICD-10-CM

## 2014-01-29 DIAGNOSIS — K219 Gastro-esophageal reflux disease without esophagitis: Secondary | ICD-10-CM

## 2014-01-29 DIAGNOSIS — I2510 Atherosclerotic heart disease of native coronary artery without angina pectoris: Secondary | ICD-10-CM | POA: Diagnosis not present

## 2014-01-29 NOTE — Assessment & Plan Note (Signed)
Minimal obstructive disease He refuses statins

## 2014-01-29 NOTE — Progress Notes (Signed)
Pre visit review using our clinic review tool, if applicable. No additional management support is needed unless otherwise documented below in the visit note. 

## 2014-01-29 NOTE — Assessment & Plan Note (Signed)
Well controlled Continue meds 

## 2014-01-29 NOTE — Assessment & Plan Note (Signed)
No sxs on pantoprazole- continue same

## 2014-01-30 ENCOUNTER — Telehealth: Payer: Self-pay | Admitting: Internal Medicine

## 2014-01-30 NOTE — Telephone Encounter (Signed)
Relevant patient education mailed to patient.  

## 2014-02-06 ENCOUNTER — Encounter (INDEPENDENT_AMBULATORY_CARE_PROVIDER_SITE_OTHER): Payer: Medicare Other | Admitting: Ophthalmology

## 2014-02-06 DIAGNOSIS — H353290 Exudative age-related macular degeneration, unspecified eye, stage unspecified: Secondary | ICD-10-CM | POA: Diagnosis not present

## 2014-02-06 DIAGNOSIS — I10 Essential (primary) hypertension: Secondary | ICD-10-CM

## 2014-02-06 DIAGNOSIS — H2510 Age-related nuclear cataract, unspecified eye: Secondary | ICD-10-CM

## 2014-02-06 DIAGNOSIS — H43819 Vitreous degeneration, unspecified eye: Secondary | ICD-10-CM

## 2014-02-06 DIAGNOSIS — H35039 Hypertensive retinopathy, unspecified eye: Secondary | ICD-10-CM | POA: Diagnosis not present

## 2014-02-06 DIAGNOSIS — H3530 Unspecified macular degeneration: Secondary | ICD-10-CM | POA: Diagnosis not present

## 2014-02-21 ENCOUNTER — Telehealth: Payer: Self-pay | Admitting: Cardiology

## 2014-02-21 ENCOUNTER — Emergency Department (HOSPITAL_COMMUNITY)
Admission: EM | Admit: 2014-02-21 | Discharge: 2014-02-21 | Disposition: A | Discharge status: Home / Self Care | Payer: Medicare Other | Attending: Emergency Medicine | Admitting: Emergency Medicine

## 2014-02-21 ENCOUNTER — Emergency Department (HOSPITAL_COMMUNITY): Payer: Medicare Other

## 2014-02-21 ENCOUNTER — Encounter (HOSPITAL_COMMUNITY): Payer: Self-pay | Admitting: Emergency Medicine

## 2014-02-21 DIAGNOSIS — I10 Essential (primary) hypertension: Secondary | ICD-10-CM | POA: Insufficient documentation

## 2014-02-21 DIAGNOSIS — Z8639 Personal history of other endocrine, nutritional and metabolic disease: Secondary | ICD-10-CM | POA: Insufficient documentation

## 2014-02-21 DIAGNOSIS — Z862 Personal history of diseases of the blood and blood-forming organs and certain disorders involving the immune mechanism: Secondary | ICD-10-CM | POA: Diagnosis not present

## 2014-02-21 DIAGNOSIS — Z79899 Other long term (current) drug therapy: Secondary | ICD-10-CM | POA: Diagnosis not present

## 2014-02-21 DIAGNOSIS — I2510 Atherosclerotic heart disease of native coronary artery without angina pectoris: Secondary | ICD-10-CM | POA: Diagnosis not present

## 2014-02-21 DIAGNOSIS — R002 Palpitations: Secondary | ICD-10-CM

## 2014-02-21 DIAGNOSIS — Z85820 Personal history of malignant melanoma of skin: Secondary | ICD-10-CM | POA: Diagnosis not present

## 2014-02-21 DIAGNOSIS — Z87891 Personal history of nicotine dependence: Secondary | ICD-10-CM | POA: Insufficient documentation

## 2014-02-21 DIAGNOSIS — Z9889 Other specified postprocedural states: Secondary | ICD-10-CM | POA: Insufficient documentation

## 2014-02-21 DIAGNOSIS — K219 Gastro-esophageal reflux disease without esophagitis: Secondary | ICD-10-CM | POA: Insufficient documentation

## 2014-02-21 DIAGNOSIS — Z8669 Personal history of other diseases of the nervous system and sense organs: Secondary | ICD-10-CM | POA: Diagnosis not present

## 2014-02-21 LAB — I-STAT TROPONIN, ED: Troponin i, poc: 0 ng/mL (ref 0.00–0.08)

## 2014-02-21 LAB — BASIC METABOLIC PANEL
BUN: 12 mg/dL (ref 6–23)
CO2: 26 mEq/L (ref 19–32)
Calcium: 10.1 mg/dL (ref 8.4–10.5)
Chloride: 101 mEq/L (ref 96–112)
Creatinine, Ser: 0.84 mg/dL (ref 0.50–1.35)
GFR calc Af Amer: >90 mL/min (ref >90)
GFR calc non Af Amer: 82 mL/min — ABNORMAL LOW (ref >90)
Glucose, Bld: 123 mg/dL — ABNORMAL HIGH (ref 70–99)
Potassium: 3.9 mEq/L (ref 3.7–5.3)
Sodium: 142 mEq/L (ref 137–147)

## 2014-02-21 LAB — CBC
HCT: 47 % (ref 39.0–52.0)
Hemoglobin: 16.8 g/dL (ref 13.0–17.0)
MCH: 33.5 pg (ref 26.0–34.0)
MCHC: 35.7 g/dL (ref 30.0–36.0)
MCV: 93.8 fL (ref 78.0–100.0)
Platelets: 170 10*3/uL (ref 150–400)
RBC: 5.01 MIL/uL (ref 4.22–5.81)
RDW: 12.8 % (ref 11.5–15.5)
WBC: 7.7 10*3/uL (ref 4.0–10.5)

## 2014-02-21 NOTE — ED Notes (Signed)
Follow up instructions reviewed with pt. Pt verbalized understanding.

## 2014-02-21 NOTE — ED Notes (Signed)
Pt c/o high HR, and palpations, with HTN, dizziness, nausea , and vomiting. Denies pain.

## 2014-02-21 NOTE — Telephone Encounter (Signed)
Returned call to patient he stated last night at 11:00 pm his heart started beating fast and irregular lasted until 8:00 am this morning.Stated he felt ok no chest pain or sob,just unable to sleep last night.B/P at 8:00 am today 188/130,now B/P 126/71 pulse 71 beats/min.Stated he is taking metoprolol 25 mg am 12.5 mg pm.Stated he has appointment in May but would like to be seen sooner, normally when heart beat is irregular it last about 1 hour lasted longer this time.Appointment scheduled with Dr.Jordan 02/27/14 at 11:45 am.Message sent to [place] for review.

## 2014-02-21 NOTE — Discharge Instructions (Signed)
Palpitations   A palpitation is the feeling that your heartbeat is irregular or is faster than normal. It may feel like your heart is fluttering or skipping a beat. Palpitations are usually not a serious problem. However, in some cases, you may need further medical evaluation.  CAUSES   Palpitations can be caused by:   Smoking.   Caffeine or other stimulants, such as diet pills or energy drinks.   Alcohol.   Stress and anxiety.   Strenuous physical activity.   Fatigue.   Certain medicines.   Heart disease, especially if you have a history of arrhythmias. This includes atrial fibrillation, atrial flutter, or supraventricular tachycardia.   An improperly working pacemaker or defibrillator.  DIAGNOSIS   To find the cause of your palpitations, your caregiver will take your history and perform a physical exam. Tests may also be done, including:   Electrocardiography (ECG). This test records the heart's electrical activity.   Cardiac monitoring. This allows your caregiver to monitor your heart rate and rhythm in real time.   Holter monitor. This is a portable device that records your heartbeat and can help diagnose heart arrhythmias. It allows your caregiver to track your heart activity for several days, if needed.   Stress tests by exercise or by giving medicine that makes the heart beat faster.  TREATMENT   Treatment of palpitations depends on the cause of your symptoms and can vary greatly. Most cases of palpitations do not require any treatment other than time, relaxation, and monitoring your symptoms. Other causes, such as atrial fibrillation, atrial flutter, or supraventricular tachycardia, usually require further treatment.  HOME CARE INSTRUCTIONS    Avoid:   Caffeinated coffee, tea, soft drinks, diet pills, and energy drinks.   Chocolate.   Alcohol.   Stop smoking if you smoke.   Reduce your stress and anxiety. Things that can help you relax include:   A method that measures bodily functions so  you can learn to control them (biofeedback).   Yoga.   Meditation.   Physical activity such as swimming, jogging, or walking.   Get plenty of rest and sleep.  SEEK MEDICAL CARE IF:    You continue to have a fast or irregular heartbeat beyond 24 hours.   Your palpitations occur more often.  SEEK IMMEDIATE MEDICAL CARE IF:   You develop chest pain or shortness of breath.   You have a severe headache.   You feel dizzy, or you faint.  MAKE SURE YOU:   Understand these instructions.   Will watch your condition.   Will get help right away if you are not doing well or get worse.  Document Released: 10/30/2000 Document Revised: 02/27/2013 Document Reviewed: 01/01/2012  [employer] Patient Information 2014 [employer].

## 2014-02-21 NOTE — Telephone Encounter (Signed)
Agree with plans for follow up.  Peter Martinique MD, [facility]

## 2014-02-21 NOTE — ED Notes (Signed)
Pt states sx began yesterday night, pt reports he is taking medication for irregular hr and it does not seem to be working. Pt reports he woke up with high blood pressure and irregular hr, sob, and dizziness. Sx passed pt went on about normal routine and then around 5pm today he began having same sx. Pt denies any cp but does state he gets sob with activity, dizziness, nausea and vomiting. Pt alert and oriented x 4. No distress noted at this time.

## 2014-02-21 NOTE — Telephone Encounter (Signed)
Patient called Dr.Jordan advised to continue same medication keep appointment with him 02/27/14.

## 2014-02-21 NOTE — Telephone Encounter (Signed)
Patient is calling again he feels that he needs to speak with you right away.

## 2014-02-21 NOTE — ED Provider Notes (Signed)
CSN: [id number]     Arrival date & time 02/21/14  1951 History   First MD Initiated Contact with Patient 02/21/14 2105     Chief Complaint  Patient presents with  . Palpitations     (Consider location/radiation/quality/duration/timing/severity/associated sxs/prior Treatment) Patient is a 79 y.o. male presenting with palpitations. The history is provided by the patient and a relative.  Palpitations  patient here complaining of palpitations x1 year ago worse last night. Feels his heart was skipping a beat every third beat. This awoke him from his sleep early this morning and he took a dose of his beta blocker. He also noted that he was hypertensive. Repeated his blood pressure and was normotensive. Denies any associated syncope or near-syncope. No shortness of breath. No chest pain or chest pressure. Symptoms went away but came back again this afternoon and he had emesis x1. Called his doctor and he has a followup appointment scheduled for the 14th of this month. Patient states his current symptoms are similar to what it had before in the past. Nothing makes the symptoms worse  Past Medical History  Diagnosis Date  . GERD (gastroesophageal reflux disease)   . Hypertension   . Syncope   . OSA (obstructive sleep apnea)   . Melanoma   . Dyslipidemia   . Macular degeneration   . Abnormal stress test December 2013    s/p cardiac cath with minimal nonobstructive CAD and normal LV function; medical management recommended  . SVT (supraventricular tachycardia)   . CAD (coronary artery disease)   . PVC's (premature ventricular contractions) 09/27/2013   Past Surgical History  Procedure Laterality Date  . Gastrectomy    . Hemorrhoid surgery    . Melanoma excision  05-05-2007    LEFT FOREARM  . Nose surgery    . Cataract extraction    . Tonsillectomy    . Appendectomy    . Cystoscopy/retrograde/ureteroscopy/stone extraction with basket  02/24/2012    Procedure:  CYSTOSCOPY/RETROGRADE/URETEROSCOPY/STONE EXTRACTION WITH BASKET;  Surgeon: Franchot Gallo, MD;  Location[facility];  Service: Urology;  Laterality: Bilateral;  1 HOUR  . Cardiac catheterization  December 2013    minimal nonobstructive CAD with normal LV function   Family History  Problem Relation Age of Onset  . Heart disease Mother     CABG in her 60s  . Pneumonia Father   . Cancer Sister     breast   History  Substance Use Topics  . Smoking status: Former Smoker    Quit date: 09/03/1956  . Smokeless tobacco: Not on file  . Alcohol Use: No    Review of Systems  Cardiovascular: Positive for palpitations.  All other systems reviewed and are negative.     Allergies  Aspirin and Percocet  Home Medications   Current Outpatient Rx  Name  Route  Sig  Dispense  Refill  . acetaminophen (TYLENOL) 500 MG tablet   Oral   Take 1,000 mg by mouth every 6 (six) hours as needed. PAIN           . bevacizumab (AVASTIN) 2.5 mg/0.1 mL SOLN      2.5 mg. Injection to eye every 8 weeks         . lisinopril (PRINIVIL,ZESTRIL) 40 MG tablet      take 1 tablet by mouth once daily   90 tablet   1   . metoCLOPramide (REGLAN) 5 MG tablet   Oral   Take 5 mg by mouth 2 (two)  times daily.         . metoprolol tartrate (LOPRESSOR) 25 MG tablet   Oral   Take 0.5 tablets (12.5 mg total) by mouth 2 (two) times daily.   180 tablet   3   . pantoprazole (PROTONIX) 40 MG tablet   Oral   Take 40 mg by mouth daily.          [name] Glycol-Propyl Glycol (SYSTANE ULTRA) 0.4-0.3 % SOLN   Ophthalmic   Apply 1 drop to eye 3 (three) times daily.           . polyethylene glycol powder (GLYCOLAX/MIRALAX) powder   Oral   Take 1 g by mouth daily as needed for mild constipation.          . tamsulosin (FLOMAX) 0.4 MG CAPS capsule   Oral   Take 1 capsule by mouth daily.         Marland Kitchen VIGAMOX 0.5 % ophthalmic solution   Both Eyes   Place 1 drop into both eyes.  Only when you get shot for eyes          BP 145/77  Pulse 76  Temp(Src) 98.7 F (37.1 C) (Oral)  Resp 18  SpO2 95% Physical Exam  Nursing note and vitals reviewed. Constitutional: He is oriented to person, place, and time. He appears well-developed and well-nourished.  Non-toxic appearance. No distress.  HENT:  Head: Normocephalic and atraumatic.  Eyes: Conjunctivae, EOM and lids are normal. Pupils are equal, round, and reactive to light.  Neck: Normal range of motion. Neck supple. No tracheal deviation present. No mass present.  Cardiovascular: Normal rate, regular rhythm and normal heart sounds.  Exam reveals no gallop.   No murmur heard. Pulmonary/Chest: Effort normal and breath sounds normal. No stridor. No respiratory distress. He has no decreased breath sounds. He has no wheezes. He has no rhonchi. He has no rales.  Abdominal: Soft. Normal appearance and bowel sounds are normal. He exhibits no distension. There is no tenderness. There is no rebound and no CVA tenderness.  Musculoskeletal: Normal range of motion. He exhibits no edema and no tenderness.  Neurological: He is alert and oriented to person, place, and time. He has normal strength. No cranial nerve deficit or sensory deficit. GCS eye subscore is 4. GCS verbal subscore is 5. GCS motor subscore is 6.  Skin: Skin is warm and dry. No abrasion and no rash noted.  Psychiatric: He has a normal mood and affect. His speech is normal and behavior is normal.    ED Course  Procedures (including critical care time) Labs Review Labs Reviewed  BASIC METABOLIC PANEL - Abnormal; Notable for the following:    Glucose, Bld 123 (*)    GFR calc non Af Amer 82 (*)    All other components within normal limits  CBC  I-STAT TROPOININ, ED   Imaging Review Dg Chest 2 View  02/21/2014   CLINICAL DATA:  Palpitations.  EXAM: CHEST  2 VIEW  COMPARISON:  Chest x-ray 09/03/2012.  FINDINGS: Mediastinum and hilar structures are unremarkable.  Cardiomegaly with normal pulmonary vascularity. No pleural effusion or pneumothorax. Interstitial prominence noted consistent with chronic interstitial lung disease. Surgical clips in the upper abdomen. No acute bony abnormality.  IMPRESSION: 1.  Cardiomegaly.  Normal pulmonary vascularity.  No CHF.  2. Chronic interstitial lung disease.   Electronically Signed   By: Marcello Moores  Register   On: 02/21/2014 21:43     EKG Interpretation   Date/Time:  Wednesday February 21 2014 19:56:38 EDT Ventricular Rate:  71 PR Interval:  160 QRS Duration: 102 QT Interval:  394 QTC Calculation: 428 R Axis:   77 Text Interpretation:  Normal sinus rhythm ST \\T \ T wave abnormality,  consider inferior ischemia Abnormal ECG No significant change since last  tracing Confirmed by Empress Newmann  MD, Liberty Stead ([id number]) on 02/21/2014 9:07:36 PM      MDM   Final diagnoses:  None    Patient with normal electrolytes here. His EKG shows no sign of acute changes. Patient to be discharged home with followup with his cardiologist    Leota Jacobsen, MD 02/21/14 2148

## 2014-02-21 NOTE — Telephone Encounter (Signed)
New message     Talk to nurse about b/p.  It is running high.

## 2014-02-22 DIAGNOSIS — L570 Actinic keratosis: Secondary | ICD-10-CM | POA: Diagnosis not present

## 2014-02-22 DIAGNOSIS — B354 Tinea corporis: Secondary | ICD-10-CM | POA: Diagnosis not present

## 2014-02-22 DIAGNOSIS — Z85820 Personal history of malignant melanoma of skin: Secondary | ICD-10-CM | POA: Diagnosis not present

## 2014-02-22 DIAGNOSIS — I781 Nevus, non-neoplastic: Secondary | ICD-10-CM | POA: Diagnosis not present

## 2014-02-27 ENCOUNTER — Ambulatory Visit (INDEPENDENT_AMBULATORY_CARE_PROVIDER_SITE_OTHER): Payer: Medicare Other | Admitting: Cardiology

## 2014-02-27 ENCOUNTER — Other Ambulatory Visit: Payer: Self-pay

## 2014-02-27 ENCOUNTER — Encounter: Payer: Self-pay | Admitting: Cardiology

## 2014-02-27 VITALS — BP 142/64 | HR 61 | Ht 73.0 in | Wt 178.8 lb

## 2014-02-27 DIAGNOSIS — I4949 Other premature depolarization: Secondary | ICD-10-CM | POA: Diagnosis not present

## 2014-02-27 DIAGNOSIS — R0600 Dyspnea, unspecified: Secondary | ICD-10-CM

## 2014-02-27 DIAGNOSIS — I10 Essential (primary) hypertension: Secondary | ICD-10-CM

## 2014-02-27 DIAGNOSIS — I493 Ventricular premature depolarization: Secondary | ICD-10-CM

## 2014-02-27 DIAGNOSIS — R0609 Other forms of dyspnea: Secondary | ICD-10-CM

## 2014-02-27 DIAGNOSIS — J8410 Pulmonary fibrosis, unspecified: Secondary | ICD-10-CM | POA: Diagnosis not present

## 2014-02-27 DIAGNOSIS — R0989 Other specified symptoms and signs involving the circulatory and respiratory systems: Secondary | ICD-10-CM

## 2014-02-27 DIAGNOSIS — I2510 Atherosclerotic heart disease of native coronary artery without angina pectoris: Secondary | ICD-10-CM

## 2014-02-27 DIAGNOSIS — J849 Interstitial pulmonary disease, unspecified: Secondary | ICD-10-CM

## 2014-02-27 MED ORDER — METOPROLOL TARTRATE 25 MG PO TABS: 25.0000 mg | ORAL_TABLET | Freq: Two times a day (BID) | ORAL | Status: DC

## 2014-02-27 MED ORDER — LOSARTAN POTASSIUM 100 MG PO TABS: 100.0000 mg | ORAL_TABLET | Freq: Every day | ORAL | Status: DC

## 2014-02-27 NOTE — Patient Instructions (Signed)
Continue metoprolol at 25 mg twice a day  We will switch lisinopril to losartan 100 mg daily  We will schedule you for pulmonary function tests  I will see you in one month.

## 2014-02-27 NOTE — Progress Notes (Signed)
Joseph Simpson Date of Birth: 29-Sep-1935 Medical Record #[id number]  History of Present Illness: Joseph Simpson is seen today for a followup visit. He has had an abnormal stress test myoview in 11/13 with abnormal EKG/runs of SVT and marked ST-T wave changes. Images were normal. Had cardiac cath showing minimal nonobstructive CAD and normal LV function. Managed medically with emphasis on BP control.  He notes skipping in his heartbeat and this makes him feel tired adn SOB. He did wear a 24-hour Holter monitor which demonstrated occasional PVCs and rare PACs. He had no significant pauses or bradycardia. His minimal heart rate was 55 beats per minute with a mean heart rate of 84 beats per minute. He still complains of palpitations especially when he lies on his side or with exertion. Gets SOB easily with exertion. Seen in ED recently and metoprolol increased to 25 mg bid. Since then HR between 56-65. Palpitations improved. Previously metoprolol dose reduced due to bradycardia.  Current Outpatient Prescriptions on File Prior to Visit  Medication Sig Dispense Refill  . acetaminophen (TYLENOL) 500 MG tablet Take 1,000 mg by mouth every 6 (six) hours as needed. PAIN        . bevacizumab (AVASTIN) 2.5 mg/0.1 mL SOLN 2.5 mg. Injection to eye every 8 weeks      . metoCLOPramide (REGLAN) 5 MG tablet Take 5 mg by mouth 2 (two) times daily.      . pantoprazole (PROTONIX) 40 MG tablet Take 40 mg by mouth daily.       [name] Glycol-Propyl Glycol (SYSTANE ULTRA) 0.4-0.3 % SOLN Apply 1 drop to eye 3 (three) times daily.        . polyethylene glycol powder (GLYCOLAX/MIRALAX) powder Take 1 g by mouth daily as needed for mild constipation.        No current facility-administered medications on file prior to visit.    Allergies  Allergen Reactions  . Aspirin Other (See Comments)    UPSET STOMACH  . Percocet [Oxycodone-Acetaminophen] Nausea And Vomiting    Past Medical History  Diagnosis Date  . GERD  (gastroesophageal reflux disease)   . Hypertension   . Syncope   . OSA (obstructive sleep apnea)   . Melanoma   . Dyslipidemia   . Macular degeneration   . Abnormal stress test December 2013    s/p cardiac cath with minimal nonobstructive CAD and normal LV function; medical management recommended  . SVT (supraventricular tachycardia)   . CAD (coronary artery disease)   . PVC's (premature ventricular contractions) 09/27/2013    Past Surgical History  Procedure Laterality Date  . Gastrectomy    . Hemorrhoid surgery    . Melanoma excision  05-05-2007    LEFT FOREARM  . Nose surgery    . Cataract extraction    . Tonsillectomy    . Appendectomy    . Cystoscopy/retrograde/ureteroscopy/stone extraction with basket  02/24/2012    Procedure: CYSTOSCOPY/RETROGRADE/URETEROSCOPY/STONE EXTRACTION WITH BASKET;  Surgeon: Franchot Gallo, MD;  Location: [facility];  Service: Urology;  Laterality: Bilateral;  1 HOUR  . Cardiac catheterization  December 2013    minimal nonobstructive CAD with normal LV function    History  Smoking status  . Former Smoker  . Quit date: 09/03/1956  Smokeless tobacco  . Not on file    History  Alcohol Use No    Family History  Problem Relation Age of Onset  . Heart disease Mother     CABG in her 33s  .  Pneumonia Father   . Cancer Sister     breast    Review of Systems: The review of systems is per the HPI.  All other systems were reviewed and are negative.  Physical Exam: BP 142/64  Pulse 61  Ht 6\' 1"  (1.854 m)  Wt 178 lb 12.8 oz (81.103 kg)  BMI 23.59 kg/m2 Patient is very pleasant and in no acute distress. Skin is warm and dry. Color is normal.  HEENT is unremarkable. Normocephalic/atraumatic. PERRL. Sclera are nonicteric. Neck is supple. No masses. No JVD. Lungs are clear. Cardiac exam shows a regular rate and rhythm with frequent extrasystoles. Abdomen is soft. Extremities are without edema. Gait and ROM are intact. No  gross neurologic deficits noted.   LABORATORY DATA: Lab Results  Component Value Date   WBC 7.7 02/21/2014   HGB 16.8 02/21/2014   HCT 47.0 02/21/2014   PLT 170 02/21/2014   GLUCOSE 123* 02/21/2014   CHOL 197 08/25/2013   TRIG 101.0 08/25/2013   HDL 42.90 08/25/2013   LDLCALC 134* 08/25/2013   ALT 21 08/25/2013   AST 23 08/25/2013   NA 142 02/21/2014   K 3.9 02/21/2014   CL 101 02/21/2014   CREATININE 0.84 02/21/2014   BUN 12 02/21/2014   CO2 26 02/21/2014   TSH 2.00 08/25/2013   PSA 2.42 10/02/2010   INR 1.0 10/18/2012    ECG: 4.8/15- NSR, nonspecific ST abnormality.  Assessment / Plan: 1. CAD - minimal nonobstructive disease by cath. Managed medically. Doing well clinically.   2. HTN - well controlled on current therapy but history of hypertensive response to exercise.  3. SVT/bradycardia/PVCs- Normal EF by cath and myoview. Patient does have more exertional symptoms. Agree with increase in metoprolol dose. If symptoms persist may be able to increase dose further but will need to avoid bradycardia. Other option includes antiarrhythmic drug therapy. Could consider flecainide or amiodarone if symptoms persist.   4. Dyspnea on exertion. This may be due to hypertensive response, ectopy, or underlying lung disease. CXR indicated some interstitial lung disease. No history of dust exposure or tobacco abuse. Will schedule PFTs. Will switch lisinopril to losartan 100 mg daily   I will follow up in 1 month

## 2014-02-28 ENCOUNTER — Ambulatory Visit (INDEPENDENT_AMBULATORY_CARE_PROVIDER_SITE_OTHER): Payer: Medicare Other | Admitting: Internal Medicine

## 2014-02-28 DIAGNOSIS — J849 Interstitial pulmonary disease, unspecified: Secondary | ICD-10-CM

## 2014-02-28 DIAGNOSIS — J8410 Pulmonary fibrosis, unspecified: Secondary | ICD-10-CM

## 2014-02-28 NOTE — Progress Notes (Signed)
PFT done today. 

## 2014-03-05 LAB — PULMONARY FUNCTION TEST
DL/VA % pred: 95 %
DL/VA: 4.43 ml/min/mmHg/L
DLCO unc % pred: 68 %
DLCO unc: 23.12 ml/min/mmHg
FEF 25-75 Post: 2.14 L/sec
FEF 25-75 Pre: 2.15 L/sec
FEF2575-%Change-Post: 0 %
FEF2575-%Pred-Post: 97 %
FEF2575-%Pred-Pre: 97 %
FEV1-%Change-Post: 1 %
FEV1-%Pred-Post: 87 %
FEV1-%Pred-Pre: 86 %
FEV1-Post: 2.71 L
FEV1-Pre: 2.68 L
FEV1FVC-%Change-Post: 4 %
FEV1FVC-%Pred-Pre: 104 %
FEV6-%Change-Post: -1 %
FEV6-%Pred-Post: 85 %
FEV6-%Pred-Pre: 87 %
FEV6-Post: 3.45 L
FEV6-Pre: 3.52 L
FEV6FVC-%Change-Post: 0 %
FEV6FVC-%Pred-Post: 106 %
FEV6FVC-%Pred-Pre: 105 %
FVC-%Change-Post: -3 %
FVC-%Pred-Post: 80 %
FVC-%Pred-Pre: 82 %
FVC-Post: 3.45 L
FVC-Pre: 3.58 L
Post FEV1/FVC ratio: 78 %
Post FEV6/FVC ratio: 100 %
Pre FEV1/FVC ratio: 75 %
Pre FEV6/FVC Ratio: 99 %

## 2014-03-13 ENCOUNTER — Other Ambulatory Visit: Payer: Self-pay

## 2014-03-13 DIAGNOSIS — R0600 Dyspnea, unspecified: Secondary | ICD-10-CM

## 2014-03-13 DIAGNOSIS — R942 Abnormal results of pulmonary function studies: Secondary | ICD-10-CM

## 2014-03-29 ENCOUNTER — Encounter: Payer: Self-pay | Admitting: Pulmonary Disease

## 2014-03-29 ENCOUNTER — Ambulatory Visit (INDEPENDENT_AMBULATORY_CARE_PROVIDER_SITE_OTHER): Payer: Medicare Other | Admitting: Pulmonary Disease

## 2014-03-29 ENCOUNTER — Other Ambulatory Visit (INDEPENDENT_AMBULATORY_CARE_PROVIDER_SITE_OTHER): Payer: Medicare Other

## 2014-03-29 VITALS — BP 140/82 | HR 64 | Ht 73.0 in | Wt 180.0 lb

## 2014-03-29 DIAGNOSIS — J8410 Pulmonary fibrosis, unspecified: Secondary | ICD-10-CM

## 2014-03-29 DIAGNOSIS — I2510 Atherosclerotic heart disease of native coronary artery without angina pectoris: Secondary | ICD-10-CM | POA: Diagnosis not present

## 2014-03-29 DIAGNOSIS — J849 Interstitial pulmonary disease, unspecified: Secondary | ICD-10-CM

## 2014-03-29 LAB — COMPREHENSIVE METABOLIC PANEL
ALT: 21 U/L (ref 0–53)
AST: 22 U/L (ref 0–37)
Albumin: 4.1 g/dL (ref 3.5–5.2)
Alkaline Phosphatase: 60 U/L (ref 39–117)
BUN: 11 mg/dL (ref 6–23)
CO2: 33 mEq/L — ABNORMAL HIGH (ref 19–32)
Calcium: 9.9 mg/dL (ref 8.4–10.5)
Chloride: 101 mEq/L (ref 96–112)
Creatinine, Ser: 0.9 mg/dL (ref 0.4–1.5)
GFR: 90.35 mL/min (ref >60.00)
Glucose, Bld: 81 mg/dL (ref 70–99)
Potassium: 3.7 mEq/L (ref 3.5–5.1)
Sodium: 140 mEq/L (ref 135–145)
Total Bilirubin: 1.2 mg/dL (ref 0.2–1.2)
Total Protein: 7 g/dL (ref 6.0–8.3)

## 2014-03-29 LAB — CBC WITH DIFFERENTIAL/PLATELET
Basophils Absolute: 0 10*3/uL (ref 0.0–0.1)
Basophils Relative: 0.3 % (ref 0.0–3.0)
Eosinophils Absolute: 0.2 10*3/uL (ref 0.0–0.7)
Eosinophils Relative: 3.4 % (ref 0.0–5.0)
HCT: 45.5 % (ref 39.0–52.0)
Hemoglobin: 15.5 g/dL (ref 13.0–17.0)
Lymphocytes Relative: 16.8 % (ref 12.0–46.0)
Lymphs Abs: 1.2 10*3/uL (ref 0.7–4.0)
MCHC: 34.1 g/dL (ref 30.0–36.0)
MCV: 94.9 fl (ref 78.0–100.0)
Monocytes Absolute: 0.6 10*3/uL (ref 0.1–1.0)
Monocytes Relative: 8.1 % (ref 3.0–12.0)
Neutro Abs: 4.9 10*3/uL (ref 1.4–7.7)
Neutrophils Relative %: 71.4 % (ref 43.0–77.0)
Platelets: 177 10*3/uL (ref 150.0–400.0)
RBC: 4.79 Mil/uL (ref 4.22–5.81)
RDW: 13 % (ref 11.5–15.5)
WBC: 6.9 10*3/uL (ref 4.0–10.5)

## 2014-03-29 LAB — SEDIMENTATION RATE: Sed Rate: 7 mm/hr (ref 0–22)

## 2014-03-29 NOTE — Progress Notes (Signed)
Chief Complaint  Patient presents with  . Pulmonary Consult    History of Present Illness: Joseph Simpson is a 79 y.o. male for evaluation of abnormal PFT.  He is followed by cardiology for hypertension.  He was in the ER in April for palpitations.  He had CXR which showed interstitial changes.  He had PFT and this showed restriction, and diffusion defect.  He was referred to pulmonary for further assessment.  He has noticed feeling more short of breath with activity for the past 1 year.  He is having more trouble walking, mowing, and vacuuming.  He has been getting water leakage in his basement, and has a smell from his basement.  This has been going on for the past year.  He is not aware of any other exposures.  He gets occasional cough, but rarely has sputum.  He gets occasional wheeze in his throat and will feel like something is stuck in his throat at times.  He has reflux, and takes protonix.  He denies history of allergies, asthma, rheumatoid arthritis, or lupus.  He was changed from lisinopril to losartan several weeks ago >> no change in his symptoms.  He has never been on inhaler therapy.  He smoked briefly as a teenager, and quit at age 85.  He is from [place].  He worked as a Administrator.  He was in the [employer] from [employer] to [place] >> stationed in [place].  He denies history of pneumonia.  His mother had TB when he was a child, but he never was told he had TB.  He denies animal exposures.  Tests: PFT 02/28/14 >> FEV1 2.71 (87%), FEV1% 78, TLC 4.80 (66%), DLCO 68%, no BD  [name]  has a past medical history of GERD (gastroesophageal reflux disease); Hypertension; Syncope; OSA (obstructive sleep apnea); Melanoma; Dyslipidemia; Macular degeneration; Abnormal stress test (December 2013); SVT (supraventricular tachycardia); CAD (coronary artery disease); and PVC's (premature ventricular contractions) (09/27/2013).  [name]  has past surgical history that includes  Gastrectomy; Hemorrhoid surgery; Melanoma excision (05-05-2007); Nose surgery; Cataract extraction; Tonsillectomy; Appendectomy; Cystoscopy/retrograde/ureteroscopy/stone extraction with basket (02/24/2012); and Cardiac catheterization (December 2013).  Prior to Admission medications   Medication Sig Start Date End Date Taking? Authorizing Provider  acetaminophen (TYLENOL) 500 MG tablet Take 1,000 mg by mouth every 6 (six) hours as needed. PAIN     Yes Historical Provider, MD  bevacizumab (AVASTIN) 2.5 mg/0.1 mL SOLN 2.5 mg. Injection to eye every 8 weeks   Yes Historical Provider, MD  losartan (COZAAR) 100 MG tablet Take 1 tablet (100 mg total) by mouth daily. 02/27/14  Yes Peter M Martinique, MD  metoCLOPramide (REGLAN) 5 MG tablet Take 5 mg by mouth 2 (two) times daily.   Yes Historical Provider, MD  metoprolol tartrate (LOPRESSOR) 25 MG tablet Take 1 tablet (25 mg total) by mouth 2 (two) times daily. 02/27/14  Yes Peter M Martinique, MD  pantoprazole (PROTONIX) 40 MG tablet Take 40 mg by mouth daily.    Yes Historical Provider, MD  Polyethyl Glycol-Propyl Glycol (SYSTANE ULTRA) 0.4-0.3 % SOLN Apply 1 drop to eye 3 (three) times daily.     Yes Historical Provider, MD  polyethylene glycol powder (GLYCOLAX/MIRALAX) powder Take 1 g by mouth daily as needed for mild constipation.  12/14/13  Yes Historical Provider, MD    Allergies  Allergen Reactions  . Aspirin Other (See Comments)    UPSET STOMACH  . Percocet [Oxycodone-Acetaminophen] Nausea And Vomiting  His family history includes Cancer in his sister; Heart disease in his mother; Pneumonia in his father.  He  reports that he quit smoking about 57 years ago. His smoking use included Cigarettes. He has a .4 pack-year smoking history. He has never used smokeless tobacco. He reports that he does not drink alcohol or use illicit drugs.  Review of Systems  Constitutional: Negative for fever and unexpected weight change.  HENT: Negative for congestion,  dental problem, ear pain, nosebleeds, postnasal drip, rhinorrhea, sinus pressure, sneezing, sore throat and trouble swallowing.   Eyes: Negative for redness and itching.  Respiratory: Positive for cough and shortness of breath. Negative for chest tightness and wheezing.   Cardiovascular: Positive for palpitations. Negative for leg swelling.  Gastrointestinal: Negative for nausea and vomiting.  Genitourinary: Negative for dysuria.  Musculoskeletal: Negative for joint swelling.  Skin: Negative for rash.  Neurological: Negative for headaches.  Hematological: Does not bruise/bleed easily.  Psychiatric/Behavioral: Negative for dysphoric mood. The patient is not nervous/anxious.    Physical Exam:  General - No distress ENT - No sinus tenderness, no oral exudate, no LAN, no thyromegaly, TM clear, pupils equal/reactive, wears glasses Cardiac - s1s2 regular, no murmur, pulses symmetric Chest - faint basilar crackles, no wheeze/dullness Back - No focal tenderness Abd - Soft, non-tender, no organomegaly, + bowel sounds Ext - No edema Neuro - Normal strength, cranial nerves intact Skin - slight scaling on dorsum of hands b/l Psych - Normal mood, and behavior   Dg Chest 2 View  02/21/2014   CLINICAL DATA:  Palpitations.  EXAM: CHEST  2 VIEW  COMPARISON:  Chest x-ray 09/03/2012.  FINDINGS: Mediastinum and hilar structures are unremarkable. Cardiomegaly with normal pulmonary vascularity. No pleural effusion or pneumothorax. Interstitial prominence noted consistent with chronic interstitial lung disease. Surgical clips in the upper abdomen. No acute bony abnormality.  IMPRESSION: 1.  Cardiomegaly.  Normal pulmonary vascularity.  No CHF.  2. Chronic interstitial lung disease.   Electronically Signed   By: Marcello Moores  Register   On: 02/21/2014 21:43    Lab Results  Component Value Date   WBC 7.7 02/21/2014   HGB 16.8 02/21/2014   HCT 47.0 02/21/2014   MCV 93.8 02/21/2014   PLT 170 02/21/2014    Lab Results   Component Value Date   CREATININE 0.84 02/21/2014   BUN 12 02/21/2014   NA 142 02/21/2014   K 3.9 02/21/2014   CL 101 02/21/2014   CO2 26 02/21/2014    Lab Results  Component Value Date   ALT 21 08/25/2013   AST 23 08/25/2013   ALKPHOS 52 08/25/2013   BILITOT 1.4* 08/25/2013    Lab Results  Component Value Date   TSH 2.00 08/25/2013    BNP    Component Value Date/Time   PROBNP 115.2 09/03/2012 1415      Assessment/Plan:  Chesley Mires, MD [place] [facility] Pager:  [phone]

## 2014-03-29 NOTE — Patient Instructions (Signed)
Lab tests today Will schedule CT chest Follow up in 4 weeks

## 2014-03-29 NOTE — Progress Notes (Deleted)
   Subjective:    Patient ID: Joseph Simpson, male    DOB: 09/25/1935, 79 y.o.   MRN: [id number]  HPI    Review of Systems  Constitutional: Negative for fever and unexpected weight change.  HENT: Negative for congestion, dental problem, ear pain, nosebleeds, postnasal drip, rhinorrhea, sinus pressure, sneezing, sore throat and trouble swallowing.   Eyes: Negative for redness and itching.  Respiratory: Positive for cough and shortness of breath. Negative for chest tightness and wheezing.   Cardiovascular: Positive for palpitations. Negative for leg swelling.  Gastrointestinal: Negative for nausea and vomiting.  Genitourinary: Negative for dysuria.  Musculoskeletal: Negative for joint swelling.  Skin: Negative for rash.  Neurological: Negative for headaches.  Hematological: Does not bruise/bleed easily.  Psychiatric/Behavioral: Negative for dysphoric mood. The patient is not nervous/anxious.        Objective:   Physical Exam        Assessment & Plan:

## 2014-03-29 NOTE — Assessment & Plan Note (Signed)
He has progressive dyspnea associated with intermittent dry cough.  He has chest xray showing interstitial changes, and PFT showing restriction and diffusion defect.  He has history of reflux.  To further assess will arrange for lab testing and high resolution CT chest.  Further diagnostics/therapeutics to be determined based on these above tests results.

## 2014-03-30 ENCOUNTER — Ambulatory Visit: Payer: Medicare Other | Admitting: Cardiology

## 2014-03-30 LAB — RHEUMATOID FACTOR: Rhuematoid fact SerPl-aCnc: <10 IU/mL (ref <=14)

## 2014-03-30 LAB — ANA W/REFLEX IF POSITIVE: Anti Nuclear Antibody(ANA): NEGATIVE

## 2014-03-30 LAB — ANTI-SCLERODERMA ANTIBODY: Scleroderma (Scl-70) (ENA) Antibody, IgG: <1

## 2014-04-03 ENCOUNTER — Encounter (INDEPENDENT_AMBULATORY_CARE_PROVIDER_SITE_OTHER): Payer: Medicare Other | Admitting: Ophthalmology

## 2014-04-03 DIAGNOSIS — I10 Essential (primary) hypertension: Secondary | ICD-10-CM

## 2014-04-03 DIAGNOSIS — H3530 Unspecified macular degeneration: Secondary | ICD-10-CM

## 2014-04-03 DIAGNOSIS — H353290 Exudative age-related macular degeneration, unspecified eye, stage unspecified: Secondary | ICD-10-CM | POA: Diagnosis not present

## 2014-04-03 DIAGNOSIS — D3130 Benign neoplasm of unspecified choroid: Secondary | ICD-10-CM

## 2014-04-03 DIAGNOSIS — H35039 Hypertensive retinopathy, unspecified eye: Secondary | ICD-10-CM | POA: Diagnosis not present

## 2014-04-03 DIAGNOSIS — H43819 Vitreous degeneration, unspecified eye: Secondary | ICD-10-CM

## 2014-04-04 ENCOUNTER — Telehealth: Payer: Self-pay | Admitting: Pulmonary Disease

## 2014-04-04 NOTE — Telephone Encounter (Signed)
CMP     Component Value Date/Time   NA 140 03/29/2014 1607   K 3.7 03/29/2014 1607   CL 101 03/29/2014 1607   CO2 33* 03/29/2014 1607   GLUCOSE 81 03/29/2014 1607   BUN 11 03/29/2014 1607   CREATININE 0.9 03/29/2014 1607   CALCIUM 9.9 03/29/2014 1607   PROT 7.0 03/29/2014 1607   ALBUMIN 4.1 03/29/2014 1607   AST 22 03/29/2014 1607   ALT 21 03/29/2014 1607   ALKPHOS 60 03/29/2014 1607   BILITOT 1.2 03/29/2014 1607   GFRNONAA 82* 02/21/2014 2007   GFRAA >90 02/21/2014 2007    CBC    Component Value Date/Time   WBC 6.9 03/29/2014 1607   RBC 4.79 03/29/2014 1607   HGB 15.5 03/29/2014 1607   HCT 45.5 03/29/2014 1607   PLT 177.0 03/29/2014 1607   MCV 94.9 03/29/2014 1607   MCH 33.5 02/21/2014 2007   MCHC 34.1 03/29/2014 1607   RDW 13.0 03/29/2014 1607   LYMPHSABS 1.2 03/29/2014 1607   MONOABS 0.6 03/29/2014 1607   EOSABS 0.2 03/29/2014 1607   BASOSABS 0.0 03/29/2014 1607    Lab Results  Component Value Date   ANA Negative 03/29/2014   RF <10 03/29/2014    Lab Results  Component Value Date   ESRSEDRATE 7 03/29/2014   SCL-70 >> less than 1  Will have my nurse inform pt that lab tests are normal.

## 2014-04-05 ENCOUNTER — Ambulatory Visit (INDEPENDENT_AMBULATORY_CARE_PROVIDER_SITE_OTHER)
Admission: RE | Admit: 2014-04-05 | Discharge: 2014-04-05 | Disposition: A | Discharge status: Home / Self Care | Payer: Medicare Other | Source: Ambulatory Visit | Attending: Pulmonary Disease | Admitting: Pulmonary Disease

## 2014-04-05 ENCOUNTER — Ambulatory Visit (INDEPENDENT_AMBULATORY_CARE_PROVIDER_SITE_OTHER): Payer: Medicare Other | Admitting: Cardiology

## 2014-04-05 ENCOUNTER — Encounter: Payer: Self-pay | Admitting: Cardiology

## 2014-04-05 VITALS — BP 142/74 | HR 73 | Ht 73.0 in | Wt 177.4 lb

## 2014-04-05 DIAGNOSIS — J8410 Pulmonary fibrosis, unspecified: Secondary | ICD-10-CM

## 2014-04-05 DIAGNOSIS — R911 Solitary pulmonary nodule: Secondary | ICD-10-CM | POA: Diagnosis not present

## 2014-04-05 DIAGNOSIS — I493 Ventricular premature depolarization: Secondary | ICD-10-CM

## 2014-04-05 DIAGNOSIS — I2510 Atherosclerotic heart disease of native coronary artery without angina pectoris: Secondary | ICD-10-CM

## 2014-04-05 DIAGNOSIS — I4949 Other premature depolarization: Secondary | ICD-10-CM

## 2014-04-05 DIAGNOSIS — E785 Hyperlipidemia, unspecified: Secondary | ICD-10-CM

## 2014-04-05 DIAGNOSIS — J849 Interstitial pulmonary disease, unspecified: Secondary | ICD-10-CM

## 2014-04-05 NOTE — Telephone Encounter (Signed)
LMOM x 1 

## 2014-04-05 NOTE — Patient Instructions (Signed)
Continue your current therapy  I will see you in 6 months.   

## 2014-04-05 NOTE — Progress Notes (Signed)
[name] Date of Birth: 12/26/1934 Medical Record #[id number]  History of Present Illness: Mr. Callander is seen today for a followup visit. He has had an abnormal stress test myoview in 11/13 with abnormal EKG/runs of SVT and marked ST-T wave changes. Images were normal. Had cardiac cath showing minimal nonobstructive CAD and normal LV function. Managed medically with emphasis on BP control.  He complained of palpitations. A 24-hour Holter monitor  demonstrated occasional PVCs and rare PACs. He had no significant pauses or bradycardia. His minimal heart rate was 55 beats per minute with a mean heart rate of 84 beats per minute. We increased his metoprolol dose and since then he notes improvement in his palpitations. He still gets it if he lies on his left side. Complains of some HA and hurting in his eyes. Currently being evaluated for newly diagnosed interstitial lung disease by Dr. Halford Chessman. Is scheduled for chest CT today.  Current Outpatient Prescriptions on File Prior to Visit  Medication Sig Dispense Refill  . acetaminophen (TYLENOL) 500 MG tablet Take 1,000 mg by mouth every 6 (six) hours as needed. PAIN        . bevacizumab (AVASTIN) 2.5 mg/0.1 mL SOLN 2.5 mg. Injection to eye every 8 weeks      . losartan (COZAAR) 100 MG tablet Take 1 tablet (100 mg total) by mouth daily.  90 tablet  3  . metoCLOPramide (REGLAN) 5 MG tablet Take 5 mg by mouth 2 (two) times daily.      . metoprolol tartrate (LOPRESSOR) 25 MG tablet Take 1 tablet (25 mg total) by mouth 2 (two) times daily.  180 tablet  3  . pantoprazole (PROTONIX) 40 MG tablet Take 40 mg by mouth daily.       [name] Glycol-Propyl Glycol (SYSTANE ULTRA) 0.4-0.3 % SOLN Apply 1 drop to eye 3 (three) times daily.        . polyethylene glycol powder (GLYCOLAX/MIRALAX) powder Take 1 g by mouth daily as needed for mild constipation.        No current facility-administered medications on file prior to visit.    Allergies  Allergen  Reactions  . Aspirin Other (See Comments)    UPSET STOMACH  . Percocet [Oxycodone-Acetaminophen] Nausea And Vomiting    Past Medical History  Diagnosis Date  . GERD (gastroesophageal reflux disease)   . Hypertension   . Syncope   . OSA (obstructive sleep apnea)   . Melanoma   . Dyslipidemia   . Macular degeneration   . Abnormal stress test December 2013    s/p cardiac cath with minimal nonobstructive CAD and normal LV function; medical management recommended  . SVT (supraventricular tachycardia)   . CAD (coronary artery disease)   . PVC's (premature ventricular contractions) 09/27/2013    Past Surgical History  Procedure Laterality Date  . Gastrectomy    . Hemorrhoid surgery    . Melanoma excision  05-05-2007    LEFT FOREARM  . Nose surgery    . Cataract extraction    . Tonsillectomy    . Appendectomy    . Cystoscopy/retrograde/ureteroscopy/stone extraction with basket  02/24/2012    Procedure: CYSTOSCOPY/RETROGRADE/URETEROSCOPY/STONE EXTRACTION WITH BASKET;  Surgeon: Franchot Gallo, MD;  Location: [facility];  Service: Urology;  Laterality: Bilateral;  1 HOUR  . Cardiac catheterization  December 2013    minimal nonobstructive CAD with normal LV function    History  Smoking status  . Former Smoker -- 0.10 packs/day for 4 years  .  Types: Cigarettes  . Quit date: 09/03/1956  Smokeless tobacco  . Never Used    History  Alcohol Use No    Family History  Problem Relation Age of Onset  . Heart disease Mother     CABG in her 1s  . Pneumonia Father   . Cancer Sister     breast    Review of Systems: The review of systems is per the HPI.  All other systems were reviewed and are negative.  Physical Exam: BP 142/74  Pulse 73  Ht 6\' 1"  (1.854 m)  Wt 177 lb 6.4 oz (80.468 kg)  BMI 23.41 kg/m2  SpO2 99% Patient is very pleasant and in no acute distress. Skin is warm and dry. Color is normal.  HEENT is unremarkable. Normocephalic/atraumatic.  PERRL. Sclera are nonicteric. Neck is supple. No masses. No JVD. Lungs are clear. Cardiac exam shows a regular rate and rhythm with few extrasystoles. Abdomen is soft. Extremities are without edema. Gait and ROM are intact. No gross neurologic deficits noted.   LABORATORY DATA: Lab Results  Component Value Date   WBC 6.9 03/29/2014   HGB 15.5 03/29/2014   HCT 45.5 03/29/2014   PLT 177.0 03/29/2014   GLUCOSE 81 03/29/2014   CHOL 197 08/25/2013   TRIG 101.0 08/25/2013   HDL 42.90 08/25/2013   LDLCALC 134* 08/25/2013   ALT 21 03/29/2014   AST 22 03/29/2014   NA 140 03/29/2014   K 3.7 03/29/2014   CL 101 03/29/2014   CREATININE 0.9 03/29/2014   BUN 11 03/29/2014   CO2 33* 03/29/2014   TSH 2.00 08/25/2013   PSA 2.42 10/02/2010   INR 1.0 10/18/2012    Assessment / Plan: 1. CAD - minimal nonobstructive disease by cath. Managed medically. Doing well clinically.   2. HTN - well controlled on current therapy but history of hypertensive response to exercise.  3. SVT/bradycardia/PVCs- Normal EF by cath and myoview. Symptoms improved with increase in metoprolol dose. Continue current therapy.  4. ILD- work up in progress. Per Dr. Halford Chessman.   I will follow up in 6 months

## 2014-04-06 ENCOUNTER — Telehealth: Payer: Self-pay | Admitting: Pulmonary Disease

## 2014-04-06 MED ORDER — ALBUTEROL SULFATE HFA 108 (90 BASE) MCG/ACT IN AERS: 2.0000 | INHALATION_SPRAY | Freq: Four times a day (QID) | RESPIRATORY_TRACT | Status: DC | PRN

## 2014-04-06 NOTE — Telephone Encounter (Signed)
04/06/2014    CLINICAL DATA:  Shortness of breath with exertion. Diffusion defect on pulmonary function test. Evaluate for potential interstitial lung disease.   EXAM: CT CHEST WITHOUT CONTRAST   TECHNIQUE: Multidetector CT imaging of the chest was performed following the standard protocol without intravenous contrast. High resolution imaging of the lungs, as well as inspiratory and expiratory imaging, was performed.   COMPARISON:  No priors.   FINDINGS:  Mediastinum:  Heart size is normal. There is no significant pericardial fluid, thickening or pericardial calcification. There is atherosclerosis of the thoracic aorta, the great vessels of the mediastinum and the coronary arteries, including calcified atherosclerotic plaque in the left circumflex and right coronary arteries. Calcifications of the aortic valve. No pathologically enlarged mediastinal are hilar lymph nodes. Please note that accurate exclusion of hilar adenopathy is limited on noncontrast CT scans. Numerous nonenlarged partially calcified mediastinal and bilateral hilar lymph nodes are noted. Esophagus is unremarkable in appearance. Surgical clips near the gastroesophageal junction.   Lungs/Pleura:  Mild peripheral subpleural reticulation in the right middle lobe and to a lesser extent in the lateral periphery of the right lower lobe. High-resolution images otherwise demonstrate no additional areas of significant ground-glass attenuation, subpleural reticulation, parenchymal banding, traction bronchiectasis or frank honeycombing to suggest an underlying interstitial lung disease at this time. There is a very small area of cylindrical bronchiectasis and peripheral bronchiolectasis in the medial aspect of the left upper lobe where there is some peribronchovascular micronodularity best demonstrated on images 11 and 12 of series 5, favored to represent an area of post infectious or inflammatory scarring, and given the presence of a calcified  granuloma in the medial aspect of the left upper lobe just beneath this region, this is likely related to old granulomatous disease. There is also a 4 mm noncalcified nodule in the left upper lobe anteriorly (image 30 of series 5). Bronchial wall thickening is noted diffusely, however, this is most apparent throughout the right upper and right middle lobes.   Upper Abdomen:  The liver has a diffusely heterogeneous in appearance with areas of low-attenuation interspersed with areas of more normal attenuation, favored to represent areas of heterogeneous hepatic steatosis; however, this appearance is new compared to prior study 02/20/2013. Numerous surgical clips near the gastroesophageal junction and inferior to the left lobe of the liver.   Musculoskeletal:  There are no aggressive appearing lytic or blastic lesions noted in the visualized portions of the skeleton.   IMPRESSION:  1. Although there are some areas of very mild peripheral subpleural reticulation in the right lung, there are no other definite imaging findings to strongly suggest an interstitial lung disease at this time.  2. There is relatively diffuse bronchial wall thickening, which is most severe in the right lung, particularly in the right middle and upper lobes.  3. Sequela of old granulomatous disease, as above.  4. 4 mm nodule in the anterior aspect of the left upper lobe. Follow-up chest CT in 1 year is recommended. If there is persistent clinical concern for interstitial lung disease, this follow-up chest CT could be performed as a high-resolution chest CT to assess for potential temporal changes in the appearance of the lung parenchyma.  5. Atherosclerosis, including two vessel coronary artery disease. Assessment for potential risk factor modification, dietary therapy or pharmacologic therapy may be warranted, if clinically indicated.  6. The appearance of the liver suggests potential heterogeneous hepatic steatosis, however, this is  new compared to prior study 02/20/2013. Correlation with liver  function tests is recommended. If there is clinical concern for hepatic disease, this could be more definitively evaluated with MRI of the abdomen with and without IV gadolinium.    Electronically Signed   By: Vinnie Langton M.D.   On: 04/06/2014 03:20    Results d/w pt over phone.  Will try him on prn proair.

## 2014-04-06 NOTE — Telephone Encounter (Signed)
Pt returned call

## 2014-04-06 NOTE — Telephone Encounter (Signed)
I spoke with patient about results and he verbalized understanding and had no questions 

## 2014-04-18 DIAGNOSIS — L259 Unspecified contact dermatitis, unspecified cause: Secondary | ICD-10-CM | POA: Diagnosis not present

## 2014-04-18 DIAGNOSIS — L570 Actinic keratosis: Secondary | ICD-10-CM | POA: Diagnosis not present

## 2014-05-08 ENCOUNTER — Encounter: Payer: Self-pay | Admitting: Pulmonary Disease

## 2014-05-08 ENCOUNTER — Ambulatory Visit (INDEPENDENT_AMBULATORY_CARE_PROVIDER_SITE_OTHER): Payer: Medicare Other | Admitting: Pulmonary Disease

## 2014-05-08 VITALS — BP 118/62 | HR 52 | Temp 97.8°F | Ht 73.0 in | Wt 178.6 lb

## 2014-05-08 DIAGNOSIS — R0989 Other specified symptoms and signs involving the circulatory and respiratory systems: Secondary | ICD-10-CM | POA: Diagnosis not present

## 2014-05-08 DIAGNOSIS — R911 Solitary pulmonary nodule: Secondary | ICD-10-CM

## 2014-05-08 DIAGNOSIS — R059 Cough, unspecified: Secondary | ICD-10-CM | POA: Diagnosis not present

## 2014-05-08 DIAGNOSIS — R05 Cough: Secondary | ICD-10-CM | POA: Diagnosis not present

## 2014-05-08 DIAGNOSIS — R0609 Other forms of dyspnea: Secondary | ICD-10-CM

## 2014-05-08 DIAGNOSIS — I2510 Atherosclerotic heart disease of native coronary artery without angina pectoris: Secondary | ICD-10-CM

## 2014-05-08 DIAGNOSIS — R0600 Dyspnea, unspecified: Secondary | ICD-10-CM

## 2014-05-08 NOTE — Progress Notes (Signed)
Chief Complaint  Patient presents with  . Follow-up    No complaints with breathing since last OV.     History of Present Illness: Joseph Simpson is a 79 y.o. male with remote hx of smoking and with dyspnea, cough, and lung nodule.  His breathing has been okay.  He occasionally uses proair and this helps.  His CT chest from May was most suggestive of old granulomatous lung disease and perhaps asthma.  He has occasionally used proair >> this helps when he uses it.    He is not having as much cough.  He is not having wheeze, chest pain, or sputum.   TESTS: PFT 02/28/14 >> FEV1 2.71 (87%), FEV1% 78, TLC 4.80 (66%), DLCO 68%, no BD Labs 03/29/14 >> ESR 7, ANA negative, RF < 10, Scl 70 < 1 CT chest 04/06/14 >> numerous calcified nodes, mild peripheral subpleural reticulation RML and RLL, small area of cylindrical BTX LUL, 4 mm LUL nodule, diffuse bronchial wall thickening, fatty liver  Joseph Simpson  has a past medical history of GERD (gastroesophageal reflux disease); Hypertension; Syncope; OSA (obstructive sleep apnea); Melanoma; Dyslipidemia; Macular degeneration; Abnormal stress test (December 2013); SVT (supraventricular tachycardia); CAD (coronary artery disease); and PVC's (premature ventricular contractions) (09/27/2013).  Joseph Simpson  has past surgical history that includes Gastrectomy; Hemorrhoid surgery; Melanoma excision (05-05-2007); Nose surgery; Cataract extraction; Tonsillectomy; Appendectomy; Cystoscopy/retrograde/ureteroscopy/stone extraction with basket (02/24/2012); and Cardiac catheterization (December 2013).  Prior to Admission medications   Medication Sig Start Date End Date Taking? Authorizing Provider  acetaminophen (TYLENOL) 500 MG tablet Take 1,000 mg by mouth every 6 (six) hours as needed. PAIN     Yes Historical Provider, MD  albuterol (PROAIR HFA) 108 (90 BASE) MCG/ACT inhaler Inhale 2 puffs into the lungs every 6 (six) hours as needed for wheezing or shortness  of breath. 04/06/14  Yes Chesley Mires, MD  bevacizumab (AVASTIN) 2.5 mg/0.1 mL SOLN 2.5 mg. Injection to eye every 8 weeks   Yes Historical Provider, MD  losartan (COZAAR) 100 MG tablet Take 1 tablet (100 mg total) by mouth daily. 02/27/14  Yes Peter M Martinique, MD  metoCLOPramide (REGLAN) 5 MG tablet Take 5 mg by mouth 2 (two) times daily.   Yes Historical Provider, MD  metoprolol tartrate (LOPRESSOR) 25 MG tablet Take 1 tablet (25 mg total) by mouth 2 (two) times daily. 02/27/14  Yes Peter M Martinique, MD  pantoprazole (PROTONIX) 40 MG tablet Take 40 mg by mouth daily.    Yes Historical Provider, MD  Polyethyl Glycol-Propyl Glycol (SYSTANE ULTRA) 0.4-0.3 % SOLN Apply 1 drop to eye 3 (three) times daily.     Yes Historical Provider, MD  polyethylene glycol powder (GLYCOLAX/MIRALAX) powder Take 1 g by mouth daily as needed for mild constipation.  12/14/13  Yes Historical Provider, MD    Allergies  Allergen Reactions  . Aspirin Other (See Comments)    UPSET STOMACH  . Percocet [Oxycodone-Acetaminophen] Nausea And Vomiting     Physical Exam:  General - No distress ENT - No sinus tenderness, no oral exudate, no LAN Cardiac - s1s2 regular, no murmur Chest - No wheeze/rales/dullness Back - No focal tenderness Abd - Soft, non-tender Ext - No edema Neuro - Normal strength Skin - No rashes Psych - normal mood, and behavior   Assessment/Plan:  Chesley Mires, MD [facility] Pager:  [phone]

## 2014-05-08 NOTE — Patient Instructions (Signed)
Follow up in 6 months 

## 2014-05-10 DIAGNOSIS — R059 Cough, unspecified: Secondary | ICD-10-CM | POA: Insufficient documentation

## 2014-05-10 DIAGNOSIS — R911 Solitary pulmonary nodule: Secondary | ICD-10-CM | POA: Insufficient documentation

## 2014-05-10 DIAGNOSIS — R05 Cough: Secondary | ICD-10-CM | POA: Insufficient documentation

## 2014-05-10 NOTE — Assessment & Plan Note (Signed)
His CT chest findings showed old granulomatous lung disease, and he had small area of regional bronchiectasis in LUL.  He may also have a component of asthma.  His symptoms are relatively mild at present.  Will continue prn albuterol for now.

## 2014-05-10 NOTE — Assessment & Plan Note (Signed)
This is Lt upper lobe.  He will need f/u CT chest w/o contrast in May 2016.

## 2014-05-21 ENCOUNTER — Encounter: Payer: Self-pay | Admitting: Physician Assistant

## 2014-05-21 ENCOUNTER — Ambulatory Visit (INDEPENDENT_AMBULATORY_CARE_PROVIDER_SITE_OTHER): Payer: Medicare Other | Admitting: Physician Assistant

## 2014-05-21 VITALS — BP 150/76 | HR 60 | Temp 97.9°F | Resp 18 | Wt 176.0 lb

## 2014-05-21 DIAGNOSIS — I10 Essential (primary) hypertension: Secondary | ICD-10-CM

## 2014-05-21 DIAGNOSIS — I2510 Atherosclerotic heart disease of native coronary artery without angina pectoris: Secondary | ICD-10-CM

## 2014-05-21 MED ORDER — LOSARTAN POTASSIUM 50 MG PO TABS: 50.0000 mg | ORAL_TABLET | Freq: Every day | ORAL | Status: DC

## 2014-05-21 MED ORDER — LOSARTAN POTASSIUM 100 MG PO TABS: 50.0000 mg | ORAL_TABLET | Freq: Every day | ORAL | Status: DC

## 2014-05-21 NOTE — Progress Notes (Signed)
Pre visit review using our clinic review tool, if applicable. No additional management support is needed unless otherwise documented below in the visit note. 

## 2014-05-21 NOTE — Patient Instructions (Addendum)
We reordered your Losartan at half the dose, you will still take 1 pill daily, it will just be 50 mg instead of 100 mg pills .This should hopefully return your blood pressure control to normal without giving you the adverse side effects.  Continue all other medications as directed.  If emergency symptoms discussed during visit developed, seek medical attention immediately.  Followup in about 1 week to reassess, or for worsening or persistent symptoms despite treatment.    Hypertension Hypertension is another name for high blood pressure. High blood pressure forces your heart to work harder to pump blood. A blood pressure reading has two numbers, which includes a higher number over a lower number (example: 110/72). HOME CARE   Have your blood pressure rechecked by your doctor.  Only take medicine as told by your doctor. Follow the directions carefully. The medicine does not work as well if you skip doses. Skipping doses also puts you at risk for problems.  Do not smoke.  Monitor your blood pressure at home as told by your doctor. GET HELP IF:  You think you are having a reaction to the medicine you are taking.  You have repeat headaches or feel dizzy.  You have puffiness (swelling) in your ankles.  You have trouble with your vision. GET HELP RIGHT AWAY IF:   You get a very bad headache and are confused.  You feel weak, numb, or faint.  You get chest or belly (abdominal) pain.  You throw up (vomit).  You cannot breathe very well. MAKE SURE YOU:   Understand these instructions.  Will watch your condition.  Will get help right away if you are not doing well or get worse. Document Released: 04/20/2008 Document Revised: 11/07/2013 Document Reviewed: 08/25/2013 [facility] Patient Information 2015 [place], [place]. This information is not intended to replace advice given to you by your health care provider. Make sure you discuss any questions you have with your health care  provider.

## 2014-05-21 NOTE — Progress Notes (Signed)
Subjective:    Patient ID: Joseph Simpson, male    DOB: 04/16/1935, 79 y.o.   MRN: [id number]  HPI Pt was switched from lisinopril to losartan by pulmonologist in April for chronic cough. Cough resolved after this. Pt states that he has been feeling badly since this switch, pt was experiencing headaches, and fatigue. States that any activity would cause him to feel exhausted. Pt decided to stop this medication over he weekend, and continued to take all of his medication except losartan. He states that he could tell by the second day that he was feeling better, headache had resolved, and was not exhausted with activity. Pt denies F/C/N/V/D/SOB/CP/Syncope.   Review of Systems As per HPI and are otherwise negative.     Past Medical History  Diagnosis Date  . GERD (gastroesophageal reflux disease)   . Hypertension   . Syncope   . OSA (obstructive sleep apnea)   . Melanoma   . Dyslipidemia   . Macular degeneration   . Abnormal stress test December 2013    s/p cardiac cath with minimal nonobstructive CAD and normal LV function; medical management recommended  . SVT (supraventricular tachycardia)   . CAD (coronary artery disease)   . PVC's (premature ventricular contractions) 09/27/2013    History   Social History  . Marital Status: Married    Spouse Name: N/A    Number of Children: 1  . Years of Education: N/A   Occupational History  . Not on file.   Social History Main Topics  . Smoking status: Former Smoker -- 0.10 packs/day for 4 years    Types: Cigarettes    Quit date: 09/03/1956  . Smokeless tobacco: Never Used  . Alcohol Use: No  . Drug Use: No  . Sexual Activity: Yes   Other Topics Concern  . Not on file   Social History Narrative  . No narrative on file    Past Surgical History  Procedure Laterality Date  . Gastrectomy    . Hemorrhoid surgery    . Melanoma excision  05-05-2007    LEFT FOREARM  . Nose surgery    . Cataract extraction    .  Tonsillectomy    . Appendectomy    . Cystoscopy/retrograde/ureteroscopy/stone extraction with basket  02/24/2012    Procedure: CYSTOSCOPY/RETROGRADE/URETEROSCOPY/STONE EXTRACTION WITH BASKET;  Surgeon: Franchot Gallo, MD;  Location: [facility];  Service: Urology;  Laterality: Bilateral;  1 HOUR  . Cardiac catheterization  December 2013    minimal nonobstructive CAD with normal LV function    Family History  Problem Relation Age of Onset  . Heart disease Mother     CABG in her 55s  . Pneumonia Father   . Cancer Sister     breast    Allergies  Allergen Reactions  . Aspirin Other (See Comments)    UPSET STOMACH  . Percocet [Oxycodone-Acetaminophen] Nausea And Vomiting    Current Outpatient Prescriptions on File Prior to Visit  Medication Sig Dispense Refill  . acetaminophen (TYLENOL) 500 MG tablet Take 1,000 mg by mouth every 6 (six) hours as needed. PAIN        . albuterol (PROAIR HFA) 108 (90 BASE) MCG/ACT inhaler Inhale 2 puffs into the lungs every 6 (six) hours as needed for wheezing or shortness of breath.  1 Inhaler  3  . bevacizumab (AVASTIN) 2.5 mg/0.1 mL SOLN 2.5 mg. Injection to eye every 8 weeks      . losartan (COZAAR) 100 MG tablet  Take 1 tablet (100 mg total) by mouth daily.  90 tablet  3  . metoCLOPramide (REGLAN) 5 MG tablet Take 5 mg by mouth 2 (two) times daily.      . metoprolol tartrate (LOPRESSOR) 25 MG tablet Take 1 tablet (25 mg total) by mouth 2 (two) times daily.  180 tablet  3  . pantoprazole (PROTONIX) 40 MG tablet Take 40 mg by mouth daily.       [name] Glycol-Propyl Glycol (SYSTANE ULTRA) 0.4-0.3 % SOLN Apply 1 drop to eye 3 (three) times daily.        . polyethylene glycol powder (GLYCOLAX/MIRALAX) powder Take 1 g by mouth daily as needed for mild constipation.        No current facility-administered medications on file prior to visit.    EXAM: BP 150/76  Pulse 60  Temp(Src) 97.9 F (36.6 C) (Oral)  Resp 18  Wt 176 lb  (79.833 kg)  SpO2 98%     Objective:   Physical Exam  Nursing note and vitals reviewed. Constitutional: He is oriented to person, place, and time. He appears well-developed and well-nourished. No distress.  HENT:  Head: Normocephalic and atraumatic.  Eyes: Conjunctivae and EOM are normal. Pupils are equal, round, and reactive to light.  Neck: Normal range of motion.  Cardiovascular: Normal rate, regular rhythm and intact distal pulses.  Exam reveals no gallop and no friction rub.   No murmur heard. Pulmonary/Chest: Effort normal and breath sounds normal. No respiratory distress. He exhibits no tenderness.  Musculoskeletal: Normal range of motion. He exhibits no edema.  Neurological: He is alert and oriented to person, place, and time.  Skin: Skin is warm and dry. No rash noted. He is not diaphoretic. No erythema. No pallor.  Psychiatric: He has a normal mood and affect. His behavior is normal. Judgment and thought content normal.    Lab Results  Component Value Date   WBC 6.9 03/29/2014   HGB 15.5 03/29/2014   HCT 45.5 03/29/2014   PLT 177.0 03/29/2014   GLUCOSE 81 03/29/2014   CHOL 197 08/25/2013   TRIG 101.0 08/25/2013   HDL 42.90 08/25/2013   LDLCALC 134* 08/25/2013   ALT 21 03/29/2014   AST 22 03/29/2014   NA 140 03/29/2014   K 3.7 03/29/2014   CL 101 03/29/2014   CREATININE 0.9 03/29/2014   BUN 11 03/29/2014   CO2 33* 03/29/2014   TSH 2.00 08/25/2013   PSA 2.42 10/02/2010   INR 1.0 10/18/2012         Assessment & Plan:  Tamel was seen today for bp medication change.  Diagnoses and associated orders for this visit:  Unspecified essential hypertension Comments: Has been controlled on 100mg  losartan, however with adverse effects. Will half dose to continue control and see if effects resolve or persist.  - losartan (COZAAR) 50 MG tablet; Take 1 tablet (50 mg total) by mouth daily.    Return precautions provided, and patient handout on hypertension.  Plan to follow up  in about 1 week to reassess, or for worsening or persistent symptoms despite treatment.  Patient Instructions  We reordered your Losartan at half the dose, you will still take 1 pill daily, it will just be 50 mg instead of 100 mg pills .This should hopefully return your blood pressure control to normal without giving you the adverse side effects.  Continue all other medications as directed.  If emergency symptoms discussed during visit developed, seek medical attention immediately.  Followup  in about 1 week to reassess, or for worsening or persistent symptoms despite treatment.

## 2014-05-25 ENCOUNTER — Ambulatory Visit (INDEPENDENT_AMBULATORY_CARE_PROVIDER_SITE_OTHER): Payer: Medicare Other | Admitting: Physician Assistant

## 2014-05-25 ENCOUNTER — Encounter: Payer: Self-pay | Admitting: Physician Assistant

## 2014-05-25 VITALS — BP 142/72 | HR 60 | Temp 97.6°F | Resp 18 | Wt 177.0 lb

## 2014-05-25 DIAGNOSIS — I2510 Atherosclerotic heart disease of native coronary artery without angina pectoris: Secondary | ICD-10-CM

## 2014-05-25 DIAGNOSIS — I10 Essential (primary) hypertension: Secondary | ICD-10-CM | POA: Diagnosis not present

## 2014-05-25 MED ORDER — VALSARTAN 160 MG PO TABS: 160.0000 mg | ORAL_TABLET | Freq: Every day | ORAL | Status: DC

## 2014-05-25 NOTE — Patient Instructions (Signed)
The symptoms that you're experiencing on Cozaar are not typical symptoms people report while taking this type of medication.  We will try an alternative called valsartan, which is in the same class, and see if you reports similar side effects or if you tolerate this well.  It is imperative for you to be treated with this medication to reduce her risk of heart complications hypertension.  Valsartan 1 pill daily.  If emergency symptoms discussed during visit developed, seek medical attention immediately.  Followup in about 1 month to reassess, or for worsening or persistent symptoms despite treatment.      Hypertension Hypertension is another name for high blood pressure. High blood pressure forces your heart to work harder to pump blood. A blood pressure reading has two numbers, which includes a higher number over a lower number (example: 110/72). HOME CARE   Have your blood pressure rechecked by your doctor.  Only take medicine as told by your doctor. Follow the directions carefully. The medicine does not work as well if you skip doses. Skipping doses also puts you at risk for problems.  Do not smoke.  Monitor your blood pressure at home as told by your doctor. GET HELP IF:  You think you are having a reaction to the medicine you are taking.  You have repeat headaches or feel dizzy.  You have puffiness (swelling) in your ankles.  You have trouble with your vision. GET HELP RIGHT AWAY IF:   You get a very bad headache and are confused.  You feel weak, numb, or faint.  You get chest or belly (abdominal) pain.  You throw up (vomit).  You cannot breathe very well. MAKE SURE YOU:   Understand these instructions.  Will watch your condition.  Will get help right away if you are not doing well or get worse. Document Released: 04/20/2008 Document Revised: 11/07/2013 Document Reviewed: 08/25/2013 [facility] Patient Information 2015 [place], [place]. This information is not  intended to replace advice given to you by your health care provider. Make sure you discuss any questions you have with your health care provider.

## 2014-05-25 NOTE — Progress Notes (Signed)
Pre visit review using our clinic review tool, if applicable. No additional management support is needed unless otherwise documented below in the visit note. 

## 2014-05-25 NOTE — Progress Notes (Signed)
Subjective:    Patient ID: Joseph Simpson, male    DOB: [birth year], 79 y.o.   MRN: [id number]  HPI Patient is a 79 y.o. male presenting for follow up of HTN. Pt was seen on 05/21/2014 for concerns over BP medication change. Pt had been switched from lisinopril to cozaar by a pulmonologist due to chronic dry cough. Pt states that since that switch, he had been feeling poorly, fatigue and having a HA. He had decided to stop taking his Cozaar a few days prior to his appointment, which resulted in elevated BP 150/76, however he also reported that his adverse symptoms resolved with holding the cozaar. Pt was restarted on half the cozaar dose and requested to follow up in 1 week. Pt today states that now that he is on the half dose, he feels his BP is under just as good control as with 100mg , however the symptoms have also returned, HA, feeling poorly, fatigue and nausea. Pt is wondering if there is another BP education he could take instead. Pt denies fevers, chills, votiming, diarrhea, SOB, chest pain, syncope.    Review of Systems As per HPI and are otherwise negative.    Past Medical History  Diagnosis Date  . GERD (gastroesophageal reflux disease)   . Hypertension   . Syncope   . OSA (obstructive sleep apnea)   . Melanoma   . Dyslipidemia   . Macular degeneration   . Abnormal stress test December 2013    s/p cardiac cath with minimal nonobstructive CAD and normal LV function; medical management recommended  . SVT (supraventricular tachycardia)   . CAD (coronary artery disease)   . PVC's (premature ventricular contractions) 09/27/2013    History   Social History  . Marital Status: Married    Spouse Name: N/A    Number of Children: 1  . Years of Education: N/A   Occupational History  . Not on file.   Social History Main Topics  . Smoking status: Former Smoker -- 0.10 packs/day for 4 years    Types: Cigarettes    Quit date: 09/03/1956  . Smokeless tobacco: Never Used  .  Alcohol Use: No  . Drug Use: No  . Sexual Activity: Yes   Other Topics Concern  . Not on file   Social History Narrative  . No narrative on file    Past Surgical History  Procedure Laterality Date  . Gastrectomy    . Hemorrhoid surgery    . Melanoma excision  05-05-2007    LEFT FOREARM  . Nose surgery    . Cataract extraction    . Tonsillectomy    . Appendectomy    . Cystoscopy/retrograde/ureteroscopy/stone extraction with basket  02/24/2012    Procedure: CYSTOSCOPY/RETROGRADE/URETEROSCOPY/STONE EXTRACTION WITH BASKET;  Surgeon: Franchot Gallo, MD;  Location: [facility];  Service: Urology;  Laterality: Bilateral;  1 HOUR  . Cardiac catheterization  December 2013    minimal nonobstructive CAD with normal LV function    Family History  Problem Relation Age of Onset  . Heart disease Mother     CABG in her 19s  . Pneumonia Father   . Cancer Sister     breast    Allergies  Allergen Reactions  . Aspirin Other (See Comments)    UPSET STOMACH  . Percocet [Oxycodone-Acetaminophen] Nausea And Vomiting    Current Outpatient Prescriptions on File Prior to Visit  Medication Sig Dispense Refill  . acetaminophen (TYLENOL) 500 MG tablet Take 1,000 mg by  mouth every 6 (six) hours as needed. PAIN        . albuterol (PROAIR HFA) 108 (90 BASE) MCG/ACT inhaler Inhale 2 puffs into the lungs every 6 (six) hours as needed for wheezing or shortness of breath.  1 Inhaler  3  . bevacizumab (AVASTIN) 2.5 mg/0.1 mL SOLN 2.5 mg. Injection to eye every 8 weeks      . losartan (COZAAR) 50 MG tablet Take 1 tablet (50 mg total) by mouth daily.  90 tablet  3  . metoCLOPramide (REGLAN) 5 MG tablet Take 5 mg by mouth 2 (two) times daily.      . metoprolol tartrate (LOPRESSOR) 25 MG tablet Take 1 tablet (25 mg total) by mouth 2 (two) times daily.  180 tablet  3  . pantoprazole (PROTONIX) 40 MG tablet Take 40 mg by mouth daily.       [name] Glycol-Propyl Glycol (SYSTANE ULTRA)  0.4-0.3 % SOLN Apply 1 drop to eye 3 (three) times daily.        . polyethylene glycol powder (GLYCOLAX/MIRALAX) powder Take 1 g by mouth daily as needed for mild constipation.        No current facility-administered medications on file prior to visit.    EXAM: BP 142/72  Pulse 60  Temp(Src) 97.6 F (36.4 C) (Oral)  Resp 18  Wt 177 lb (80.287 kg)     Objective:   Physical Exam  Nursing note and vitals reviewed. Constitutional: He is oriented to person, place, and time. He appears well-developed and well-nourished. No distress.  HENT:  Head: Normocephalic and atraumatic.  Eyes: Conjunctivae and EOM are normal. Pupils are equal, round, and reactive to light.  Neck: Normal range of motion.  Cardiovascular: Normal rate, regular rhythm and intact distal pulses.   Pulmonary/Chest: Effort normal and breath sounds normal. No respiratory distress. He exhibits no tenderness.  Musculoskeletal: Normal range of motion.  Neurological: He is alert and oriented to person, place, and time.  Skin: Skin is warm and dry. No rash noted. He is not diaphoretic. No erythema. No pallor.  Psychiatric: He has a normal mood and affect. His behavior is normal. Judgment and thought content normal.     Lab Results  Component Value Date   WBC 6.9 03/29/2014   HGB 15.5 03/29/2014   HCT 45.5 03/29/2014   PLT 177.0 03/29/2014   GLUCOSE 81 03/29/2014   CHOL 197 08/25/2013   TRIG 101.0 08/25/2013   HDL 42.90 08/25/2013   LDLCALC 134* 08/25/2013   ALT 21 03/29/2014   AST 22 03/29/2014   NA 140 03/29/2014   K 3.7 03/29/2014   CL 101 03/29/2014   CREATININE 0.9 03/29/2014   BUN 11 03/29/2014   CO2 33* 03/29/2014   TSH 2.00 08/25/2013   PSA 2.42 10/02/2010   INR 1.0 10/18/2012        Assessment & Plan:  Joseph Simpson was seen today for nausea.  Diagnoses and associated orders for this visit:  Unspecified essential hypertension Comments: Atypical adverse symptoms, will try Valsartan instead of Cozaar, stressed the  importance of this medication class to pt. - valsartan (DIOVAN) 160 MG tablet; Take 1 tablet (160 mg total) by mouth daily.    Pt advised that his symptoms were not typical symptoms experienced by ARBs, and that he is really benefiting from the ARB class due to cardioprotective quality. Suggested that we try alternative ARB Valsartan, and see if he experiences the same side effects. Pt is amenable to this plan.  Return precautions provided, and patient handout on HTN.  Plan to follow up in about 4 weeks to reassess, or for worsening or persistent symptoms despite treatment.  Patient Instructions  The symptoms that you're experiencing on Cozaar are not typical symptoms people report while taking this type of medication.  We will try an alternative called valsartan, which is in the same class, and see if you reports similar side effects or if you tolerate this well.  It is imperative for you to be treated with this medication to reduce her risk of heart complications hypertension.  Valsartan 1 pill daily.  If emergency symptoms discussed during visit developed, seek medical attention immediately.  Followup in about 1 month to reassess, or for worsening or persistent symptoms despite treatment.

## 2014-05-28 ENCOUNTER — Ambulatory Visit: Payer: Medicare Other | Admitting: Physician Assistant

## 2014-05-29 ENCOUNTER — Encounter (INDEPENDENT_AMBULATORY_CARE_PROVIDER_SITE_OTHER): Payer: Medicare Other | Admitting: Ophthalmology

## 2014-05-29 DIAGNOSIS — H3530 Unspecified macular degeneration: Secondary | ICD-10-CM

## 2014-05-29 DIAGNOSIS — H353290 Exudative age-related macular degeneration, unspecified eye, stage unspecified: Secondary | ICD-10-CM | POA: Diagnosis not present

## 2014-05-29 DIAGNOSIS — I10 Essential (primary) hypertension: Secondary | ICD-10-CM | POA: Diagnosis not present

## 2014-05-29 DIAGNOSIS — H35039 Hypertensive retinopathy, unspecified eye: Secondary | ICD-10-CM

## 2014-05-29 DIAGNOSIS — H2510 Age-related nuclear cataract, unspecified eye: Secondary | ICD-10-CM

## 2014-05-29 DIAGNOSIS — H43819 Vitreous degeneration, unspecified eye: Secondary | ICD-10-CM

## 2014-06-06 ENCOUNTER — Telehealth: Payer: Self-pay

## 2014-06-06 ENCOUNTER — Encounter: Payer: Self-pay | Admitting: Cardiology

## 2014-06-06 ENCOUNTER — Ambulatory Visit (INDEPENDENT_AMBULATORY_CARE_PROVIDER_SITE_OTHER): Payer: Medicare Other | Admitting: Cardiology

## 2014-06-06 VITALS — BP 122/100 | HR 57 | Ht 73.0 in | Wt 175.7 lb

## 2014-06-06 DIAGNOSIS — I2510 Atherosclerotic heart disease of native coronary artery without angina pectoris: Secondary | ICD-10-CM

## 2014-06-06 DIAGNOSIS — R55 Syncope and collapse: Secondary | ICD-10-CM | POA: Diagnosis not present

## 2014-06-06 DIAGNOSIS — I10 Essential (primary) hypertension: Secondary | ICD-10-CM

## 2014-06-06 NOTE — Telephone Encounter (Signed)
Received call from patient he stated he would like to see Dr.Jordan.Stated he saw PCP's PA recently and Losartan was changed to Valsartan 160 mg daily due to Losartan causing headache and weakness.Stated he passed out last Wednesday 05/30/14 after he got out of his car and was standing waiting on someone.Stated B/P was too low 100/50.Stated he was told he would have to take Valsartan or Losartan for his heart.Stated he just don't feel right and would like for Dr.Jordan to see him to get medication adjusted.Stated he is going out of town soon and wants to feel better.No extender appointments available.Appointment scheduled with Dr.Jordan this afternoon 06/06/14 at 4:45 pm.

## 2014-06-06 NOTE — Patient Instructions (Signed)
Stop Valsartan  Resume losartan 50 mg daily but take at night. If you continue to have symptoms reduce dose to 25 mg.  Let me know if you continue to have problems

## 2014-06-06 NOTE — Progress Notes (Signed)
Joseph Simpson Date of Birth: June 26, 1935 Medical Record #[id number]  History of Present Illness: Joseph Simpson is seen today for a followup visit. He has had an abnormal stress test myoview in 11/13 with abnormal EKG/runs of SVT and marked ST-T wave changes. Images were normal. Had cardiac cath showing minimal nonobstructive CAD and normal LV function. Managed medically with emphasis on BP control.  He complained of palpitations. A 24-hour Holter monitor  demonstrated occasional PVCs and rare PACs. He had no significant pauses or bradycardia. His minimal heart rate was 55 beats per minute with a mean heart rate of 84 beats per minute. We increased his metoprolol dose and since then he notes improvement in his palpitations.  Recently he complained of symptoms of extreme weakness and dizziness after taking his morning meds. His Losartan was reduced to 50 mg daily. His symptoms persisted and he was switched to Valsartan 160 mg daily. Last week he had a syncopal episode while standing in the hot sun. No chest pain or palpitations.   Current Outpatient Prescriptions on File Prior to Visit  Medication Sig Dispense Refill  . acetaminophen (TYLENOL) 500 MG tablet Take 1,000 mg by mouth every 6 (six) hours as needed. PAIN        . albuterol (PROAIR HFA) 108 (90 BASE) MCG/ACT inhaler Inhale 2 puffs into the lungs every 6 (six) hours as needed for wheezing or shortness of breath.  1 Inhaler  3  . bevacizumab (AVASTIN) 2.5 mg/0.1 mL SOLN 2.5 mg. Injection to eye every 8 weeks      . metoCLOPramide (REGLAN) 5 MG tablet Take 5 mg by mouth 2 (two) times daily.      . metoprolol tartrate (LOPRESSOR) 25 MG tablet Take 1 tablet (25 mg total) by mouth 2 (two) times daily.  180 tablet  3  . pantoprazole (PROTONIX) 40 MG tablet Take 40 mg by mouth daily.       [name] Glycol-Propyl Glycol (SYSTANE ULTRA) 0.4-0.3 % SOLN Apply 1 drop to eye 3 (three) times daily.        . valsartan (DIOVAN) 160 MG tablet Take 1 tablet  (160 mg total) by mouth daily.  30 tablet  3   No current facility-administered medications on file prior to visit.    Allergies  Allergen Reactions  . Aspirin Other (See Comments)    UPSET STOMACH  . Percocet [Oxycodone-Acetaminophen] Nausea And Vomiting    Past Medical History  Diagnosis Date  . GERD (gastroesophageal reflux disease)   . Hypertension   . Syncope   . OSA (obstructive sleep apnea)   . Melanoma   . Dyslipidemia   . Macular degeneration   . Abnormal stress test December 2013    s/p cardiac cath with minimal nonobstructive CAD and normal LV function; medical management recommended  . SVT (supraventricular tachycardia)   . CAD (coronary artery disease)   . PVC's (premature ventricular contractions) 09/27/2013    Past Surgical History  Procedure Laterality Date  . Gastrectomy    . Hemorrhoid surgery    . Melanoma excision  05-05-2007    LEFT FOREARM  . Nose surgery    . Cataract extraction    . Tonsillectomy    . Appendectomy    . Cystoscopy/retrograde/ureteroscopy/stone extraction with basket  02/24/2012    Procedure: CYSTOSCOPY/RETROGRADE/URETEROSCOPY/STONE EXTRACTION WITH BASKET;  Surgeon: Franchot Gallo, MD;  Location: [facility];  Service: Urology;  Laterality: Bilateral;  1 HOUR  . Cardiac catheterization  December 2013  minimal nonobstructive CAD with normal LV function    History  Smoking status  . Former Smoker -- 0.10 packs/day for 4 years  . Types: Cigarettes  . Quit date: 09/03/1956  Smokeless tobacco  . Never Used    History  Alcohol Use No    Family History  Problem Relation Age of Onset  . Heart disease Mother     CABG in her 67s  . Pneumonia Father   . Cancer Sister     breast    Review of Systems: The review of systems is per the HPI.  All other systems were reviewed and are negative.  Physical Exam: BP 122/100  Pulse 57  Ht 6\' 1"  (1.854 m)  Wt 175 lb 11.2 oz (79.697 kg)  BMI 23.19 kg/m2  SpO2  97% Patient is very pleasant and in no acute distress. Skin is warm and dry. Color is normal.  HEENT is unremarkable. Normocephalic/atraumatic. PERRL. Sclera are nonicteric. Neck is supple. No masses. No JVD. Lungs are clear. Cardiac exam shows a regular rate and rhythm with few extrasystoles. Abdomen is soft. Extremities are without edema. Gait and ROM are intact. No gross neurologic deficits noted.   LABORATORY DATA: Lab Results  Component Value Date   WBC 6.9 03/29/2014   HGB 15.5 03/29/2014   HCT 45.5 03/29/2014   PLT 177.0 03/29/2014   GLUCOSE 81 03/29/2014   CHOL 197 08/25/2013   TRIG 101.0 08/25/2013   HDL 42.90 08/25/2013   LDLCALC 134* 08/25/2013   ALT 21 03/29/2014   AST 22 03/29/2014   NA 140 03/29/2014   K 3.7 03/29/2014   CL 101 03/29/2014   CREATININE 0.9 03/29/2014   BUN 11 03/29/2014   CO2 33* 03/29/2014   TSH 2.00 08/25/2013   PSA 2.42 10/02/2010   INR 1.0 10/18/2012    Assessment / Plan: 1. CAD - minimal nonobstructive disease by cath. Managed medically. Doing well clinically.   2. HTN - fair control on meds but with intolerable side effects with syncope. Will switch back to losartan 50 mg and take at bedtime. If symptoms persist will reduce dose to 25 mg qhs. He is to call if he continues to have problems. Would avoid overly aggressive BP control.  3. SVT/bradycardia/PVCs- Normal EF by cath and myoview. Symptoms improved with increase in metoprolol dose. Continue current therapy.  4. Granulomatous lung disease. Followed by Dr. Halford Chessman.

## 2014-06-14 ENCOUNTER — Ambulatory Visit: Payer: Medicare Other | Admitting: Physician Assistant

## 2014-07-24 ENCOUNTER — Encounter (INDEPENDENT_AMBULATORY_CARE_PROVIDER_SITE_OTHER): Payer: Medicare Other | Admitting: Ophthalmology

## 2014-07-24 DIAGNOSIS — H3530 Unspecified macular degeneration: Secondary | ICD-10-CM

## 2014-07-24 DIAGNOSIS — H353290 Exudative age-related macular degeneration, unspecified eye, stage unspecified: Secondary | ICD-10-CM | POA: Diagnosis not present

## 2014-07-24 DIAGNOSIS — H35039 Hypertensive retinopathy, unspecified eye: Secondary | ICD-10-CM

## 2014-07-24 DIAGNOSIS — I10 Essential (primary) hypertension: Secondary | ICD-10-CM | POA: Diagnosis not present

## 2014-07-24 DIAGNOSIS — H43819 Vitreous degeneration, unspecified eye: Secondary | ICD-10-CM

## 2014-07-24 DIAGNOSIS — D3130 Benign neoplasm of unspecified choroid: Secondary | ICD-10-CM

## 2014-07-26 ENCOUNTER — Telehealth: Payer: Self-pay | Admitting: Cardiology

## 2014-07-26 NOTE — Telephone Encounter (Signed)
Returned a call to patient. He called to inform us that every 8 weeks he gets "a shot in my eye".  Starting 2-3 days after he receives the injection he notices that his blood pressure will elevate after Standing 10-15 minutes. This will last for about 1 week. He received his most recent eye injection this past Tuesday.  This morning his blood pressure @ 9:00 am was 150/84. Lunchtime  138/72. Approximately 2:30 it went back up to 184/93. Patient currently takes Losartan 25 mg BID-( He takes 1/4 of a 100 mg tablet twice daily.) patient informed that I will inform Dr. Martinique of his concern, also recommended that he also inform his opthamologist. Message will be routed to Dr. Martinique for review and recommendations.

## 2014-07-26 NOTE — Telephone Encounter (Signed)
I would increase losartan to 50 mg bid until blood pressure normalizes.  Rie Mcneil Martinique MD, [facility]

## 2014-07-26 NOTE — Telephone Encounter (Signed)
Spoke to patient Dr.Jordan advised increase losartan to 50 mg twice a day.Advised to continue to monitor B/P and call back if continues to be elevated.

## 2014-07-26 NOTE — Telephone Encounter (Signed)
Please call,his blood pressure is up high.

## 2014-07-26 NOTE — Addendum Note (Signed)
Addended by: Golden Hurter D on: 07/26/2014 06:11 PM   Modules accepted: Orders, Medications

## 2014-07-29 ENCOUNTER — Emergency Department (HOSPITAL_COMMUNITY)
Admission: EM | Admit: 2014-07-29 | Discharge: 2014-07-29 | Disposition: A | Discharge status: Home / Self Care | Payer: Medicare Other | Attending: Emergency Medicine | Admitting: Emergency Medicine

## 2014-07-29 ENCOUNTER — Encounter (HOSPITAL_COMMUNITY): Payer: Self-pay | Admitting: Emergency Medicine

## 2014-07-29 DIAGNOSIS — Z862 Personal history of diseases of the blood and blood-forming organs and certain disorders involving the immune mechanism: Secondary | ICD-10-CM | POA: Insufficient documentation

## 2014-07-29 DIAGNOSIS — Z8639 Personal history of other endocrine, nutritional and metabolic disease: Secondary | ICD-10-CM | POA: Diagnosis not present

## 2014-07-29 DIAGNOSIS — Z9889 Other specified postprocedural states: Secondary | ICD-10-CM | POA: Insufficient documentation

## 2014-07-29 DIAGNOSIS — Z87891 Personal history of nicotine dependence: Secondary | ICD-10-CM | POA: Insufficient documentation

## 2014-07-29 DIAGNOSIS — Z79899 Other long term (current) drug therapy: Secondary | ICD-10-CM | POA: Insufficient documentation

## 2014-07-29 DIAGNOSIS — I10 Essential (primary) hypertension: Secondary | ICD-10-CM | POA: Insufficient documentation

## 2014-07-29 DIAGNOSIS — I2510 Atherosclerotic heart disease of native coronary artery without angina pectoris: Secondary | ICD-10-CM | POA: Diagnosis not present

## 2014-07-29 DIAGNOSIS — Z8669 Personal history of other diseases of the nervous system and sense organs: Secondary | ICD-10-CM | POA: Diagnosis not present

## 2014-07-29 DIAGNOSIS — K219 Gastro-esophageal reflux disease without esophagitis: Secondary | ICD-10-CM | POA: Diagnosis not present

## 2014-07-29 DIAGNOSIS — Z85820 Personal history of malignant melanoma of skin: Secondary | ICD-10-CM | POA: Diagnosis not present

## 2014-07-29 DIAGNOSIS — R002 Palpitations: Secondary | ICD-10-CM

## 2014-07-29 LAB — I-STAT CHEM 8, ED
BUN: 7 mg/dL (ref 6–23)
Calcium, Ion: 1.15 mmol/L (ref 1.13–1.30)
Chloride: 105 mEq/L (ref 96–112)
Creatinine, Ser: 0.9 mg/dL (ref 0.50–1.35)
Glucose, Bld: 115 mg/dL — ABNORMAL HIGH (ref 70–99)
HCT: 47 % (ref 39.0–52.0)
Hemoglobin: 16 g/dL (ref 13.0–17.0)
Potassium: 3.8 mEq/L (ref 3.7–5.3)
Sodium: 139 mEq/L (ref 137–147)
TCO2: 37 mmol/L (ref 0–100)

## 2014-07-29 NOTE — Discharge Instructions (Signed)
Hypertension °Hypertension, commonly called high blood pressure, is when the force of blood pumping through your arteries is too strong. Your arteries are the blood vessels that carry blood from your heart throughout your body. A blood pressure reading consists of a higher number over a lower number, such as 110/72. The higher number (systolic) is the pressure inside your arteries when your heart pumps. The lower number (diastolic) is the pressure inside your arteries when your heart relaxes. Ideally you want your blood pressure below 120/80. °Hypertension forces your heart to work harder to pump blood. Your arteries may become narrow or stiff. Having hypertension puts you at risk for heart disease, stroke, and other problems.  °RISK FACTORS °Some risk factors for high blood pressure are controllable. Others are not.  °Risk factors you cannot control include:  °· Race. You may be at higher risk if you are African American. °· Age. Risk increases with age. °· Gender. Men are at higher risk than women before age 45 years. After age 65, women are at higher risk than men. °Risk factors you can control include: °· Not getting enough exercise or physical activity. °· Being overweight. °· Getting too much fat, sugar, calories, or salt in your diet. °· Drinking too much alcohol. °SIGNS AND SYMPTOMS °Hypertension does not usually cause signs or symptoms. Extremely high blood pressure (hypertensive crisis) may cause headache, anxiety, shortness of breath, and nosebleed. °DIAGNOSIS  °To check if you have hypertension, your health care provider will measure your blood pressure while you are seated, with your arm held at the level of your heart. It should be measured at least twice using the same arm. Certain conditions can cause a difference in blood pressure between your right and left arms. A blood pressure reading that is higher than normal on one occasion does not mean that you need treatment. If one blood pressure reading  is high, ask your health care provider about having it checked again. °TREATMENT  °Treating high blood pressure includes making lifestyle changes and possibly taking medicine. Living a healthy lifestyle can help lower high blood pressure. You may need to change some of your habits. °Lifestyle changes may include: °· Following the DASH diet. This diet is high in fruits, vegetables, and whole grains. It is low in salt, red meat, and added sugars. °· Getting at least 2½ hours of brisk physical activity every week. °· Losing weight if necessary. °· Not smoking. °· Limiting alcoholic beverages. °· Learning ways to reduce stress. ° If lifestyle changes are not enough to get your blood pressure under control, your health care provider may prescribe medicine. You may need to take more than one. Work closely with your health care provider to understand the risks and benefits. °HOME CARE INSTRUCTIONS °· Have your blood pressure rechecked as directed by your health care provider.   °· Take medicines only as directed by your health care provider. Follow the directions carefully. Blood pressure medicines must be taken as prescribed. The medicine does not work as well when you skip doses. Skipping doses also puts you at risk for problems.   °· Do not smoke.   °· Monitor your blood pressure at home as directed by your health care provider.  °SEEK MEDICAL CARE IF:  °· You think you are having a reaction to medicines taken. °· You have recurrent headaches or feel dizzy. °· You have swelling in your ankles. °· You have trouble with your vision. °SEEK IMMEDIATE MEDICAL CARE IF: °· You develop a severe headache or confusion. °·   You have unusual weakness, numbness, or feel faint.  You have severe chest or abdominal pain.  You vomit repeatedly.  You have trouble breathing. MAKE SURE YOU:   Understand these instructions.  Will watch your condition.  Will get help right away if you are not doing well or get worse. Document  Released: 11/02/2005 Document Revised: 03/19/2014 Document Reviewed: 08/25/2013 [facility] Patient Information 2015 [place], [place]. This information is not intended to replace advice given to you by your health care provider. Make sure you discuss any questions you have with your health care provider. Palpitations A palpitation is the feeling that your heartbeat is irregular or is faster than normal. It may feel like your heart is fluttering or skipping a beat. Palpitations are usually not a serious problem. However, in some cases, you may need further medical evaluation. CAUSES  Palpitations can be caused by:  Smoking.  Caffeine or other stimulants, such as diet pills or energy drinks.  Alcohol.  Stress and anxiety.  Strenuous physical activity.  Fatigue.  Certain medicines.  Heart disease, especially if you have a history of irregular heart rhythms (arrhythmias), such as atrial fibrillation, atrial flutter, or supraventricular tachycardia.  An improperly working pacemaker or defibrillator. DIAGNOSIS  To find the cause of your palpitations, your health care provider will take your medical history and perform a physical exam. Your health care provider may also have you take a test called an ambulatory electrocardiogram (ECG). An ECG records your heartbeat patterns over a 24-hour period. You may also have other tests, such as:  Transthoracic echocardiogram (TTE). During echocardiography, sound waves are used to evaluate how blood flows through your heart.  Transesophageal echocardiogram (TEE).  Cardiac monitoring. This allows your health care provider to monitor your heart rate and rhythm in real time.  Holter monitor. This is a portable device that records your heartbeat and can help diagnose heart arrhythmias. It allows your health care provider to track your heart activity for several days, if needed.  Stress tests by exercise or by giving medicine that makes the heart beat  faster. TREATMENT  Treatment of palpitations depends on the cause of your symptoms and can vary greatly. Most cases of palpitations do not require any treatment other than time, relaxation, and monitoring your symptoms. Other causes, such as atrial fibrillation, atrial flutter, or supraventricular tachycardia, usually require further treatment. HOME CARE INSTRUCTIONS   Avoid:  Caffeinated coffee, tea, soft drinks, diet pills, and energy drinks.  Chocolate.  Alcohol.  Stop smoking if you smoke.  Reduce your stress and anxiety. Things that can help you relax include:  A method of controlling things in your body, such as your heartbeats, with your mind (biofeedback).  Yoga.  Meditation.  Physical activity such as swimming, jogging, or walking.  Get plenty of rest and sleep. SEEK MEDICAL CARE IF:   You continue to have a fast or irregular heartbeat beyond 24 hours.  Your palpitations occur more often. SEEK IMMEDIATE MEDICAL CARE IF:  You have chest pain or shortness of breath.  You have a severe headache.  You feel dizzy or you faint. MAKE SURE YOU:  Understand these instructions.  Will watch your condition.  Will get help right away if you are not doing well or get worse. Document Released: 10/30/2000 Document Revised: 11/07/2013 Document Reviewed: 01/01/2012 [facility] Patient Information 2015 [place], [place]. This information is not intended to replace advice given to you by your health care provider. Make sure you discuss any questions you have with  your health care provider.

## 2014-07-29 NOTE — ED Notes (Signed)
The pt woke up at 0200 and his heart was rapid and very irregular  Along with high bp.  nio pain.  His bp med was oincreased Thursday and since the increase his bp has been even higher

## 2014-07-29 NOTE — ED Provider Notes (Signed)
CSN: [id number]     Arrival date & time 07/29/14  4132 History   First MD Initiated Contact with Patient 07/29/14 [phone]     Chief Complaint  Patient presents with  . Hypertension     (Consider location/radiation/quality/duration/timing/severity/associated sxs/prior Treatment) HPI Patient awakened at 2 AM today feeling as if his heart was skipping beats. He took his blood pressure and noted it to be 205/105. Patient reports "skipped heartbeat" for 2 years, no different today. He presently complains of a mild frontal headache. No other associated symptoms. No he's had no shortness of breath no chest pain no lightheadedness. No treatment prior to coming here. Past Medical History  Diagnosis Date  . GERD (gastroesophageal reflux disease)   . Hypertension   . Syncope   . OSA (obstructive sleep apnea)   . Melanoma   . Dyslipidemia   . Macular degeneration   . Abnormal stress test December 2013    s/p cardiac cath with minimal nonobstructive CAD and normal LV function; medical management recommended  . SVT (supraventricular tachycardia)   . CAD (coronary artery disease)   . PVC's (premature ventricular contractions) 09/27/2013   Past Surgical History  Procedure Laterality Date  . Gastrectomy    . Hemorrhoid surgery    . Melanoma excision  05-05-2007    LEFT FOREARM  . Nose surgery    . Cataract extraction    . Tonsillectomy    . Appendectomy    . Cystoscopy/retrograde/ureteroscopy/stone extraction with basket  02/24/2012    Procedure: CYSTOSCOPY/RETROGRADE/URETEROSCOPY/STONE EXTRACTION WITH BASKET;  Surgeon: Franchot Gallo, MD;  Location: [facility];  Service: Urology;  Laterality: Bilateral;  1 HOUR  . Cardiac catheterization  December 2013    minimal nonobstructive CAD with normal LV function   Family History  Problem Relation Age of Onset  . Heart disease Mother     CABG in her 33s  . Pneumonia Father   . Cancer Sister     breast   History  Substance  Use Topics  . Smoking status: Former Smoker -- 0.10 packs/day for 4 years    Types: Cigarettes    Quit date: 09/03/1956  . Smokeless tobacco: Never Used  . Alcohol Use: No    Review of Systems  Constitutional: Negative.   Respiratory: Negative.   Cardiovascular: Positive for palpitations.  Gastrointestinal: Negative.   Musculoskeletal: Negative.   Skin: Negative.   Neurological: Positive for headaches.  Psychiatric/Behavioral: Negative.   All other systems reviewed and are negative.     Allergies  Aspirin and Percocet  Home Medications   Prior to Admission medications   Medication Sig Start Date End Date Taking? Authorizing Provider  acetaminophen (TYLENOL) 500 MG tablet Take 1,000 mg by mouth every 6 (six) hours as needed. PAIN     Yes Historical Provider, MD  bevacizumab (AVASTIN) 2.5 mg/0.1 mL SOLN 2.5 mg. Injection to eye every 8 weeks   Yes Historical Provider, MD  losartan (COZAAR) 100 MG tablet Take 50 mg by mouth 2 (two) times daily.   Yes Historical Provider, MD  metoCLOPramide (REGLAN) 5 MG tablet Take 5 mg by mouth 2 (two) times daily.   Yes Historical Provider, MD  metoprolol tartrate (LOPRESSOR) 25 MG tablet Take 1 tablet (25 mg total) by mouth 2 (two) times daily. 02/27/14  Yes Peter M Martinique, MD  pantoprazole (PROTONIX) 40 MG tablet Take 40 mg by mouth daily.    Yes Historical Provider, MD  Polyethyl Glycol-Propyl Glycol (SYSTANE ULTRA) 0.4-0.3 %  SOLN Apply 1 drop to eye 3 (three) times daily.     Yes Historical Provider, MD  albuterol (PROAIR HFA) 108 (90 BASE) MCG/ACT inhaler Inhale 2 puffs into the lungs every 6 (six) hours as needed for wheezing or shortness of breath. 04/06/14   Chesley Mires, MD   BP 177/90  Pulse 93  Temp(Src) 97.9 F (36.6 C) (Oral)  Resp 16  SpO2 97% Physical Exam  Nursing note and vitals reviewed. Constitutional: He appears well-developed and well-nourished.  HENT:  Head: Normocephalic and atraumatic.  Eyes: Conjunctivae are  normal. Pupils are equal, round, and reactive to light.  Neck: Neck supple. No tracheal deviation present. No thyromegaly present.  Cardiovascular: Normal rate and regular rhythm.   No murmur heard. Pulmonary/Chest: Effort normal and breath sounds normal.  Abdominal: Soft. Bowel sounds are normal. He exhibits no distension. There is no tenderness.  Musculoskeletal: Normal range of motion. He exhibits no edema and no tenderness.  Neurological: He is alert. Coordination normal.  Skin: Skin is warm and dry. No rash noted.  Psychiatric: He has a normal mood and affect.    ED Course  Procedures (including critical care time) Labs Review Labs Reviewed  BASIC METABOLIC PANEL  CBC  TROPONIN I  MAGNESIUM    Imaging Review No results found.   EKG Interpretation   Date/Time:  Sunday July 29 2014 05:25:01 EDT Ventricular Rate:  90 PR Interval:  164 QRS Duration: 94 QT Interval:  352 QTC Calculation: 430 R Axis:   70 Text Interpretation:  Sinus rhythm with occasional Premature ventricular  complexes ST \\T \ T wave abnormality, consider inferior ischemia Abnormal  ECG No significant change since last tracing Confirmed by OTTER  MD, OLGA  ([id number]) on 07/29/2014 5:28:05 AM     9 AM patient resting comfortably. Results for orders placed during the hospital encounter of 07/29/14  I-STAT CHEM 8, ED      Result Value Ref Range   Sodium 139  137 - 147 mEq/L   Potassium 3.8  3.7 - 5.3 mEq/L   Chloride 105  96 - 112 mEq/L   BUN 7  6 - 23 mg/dL   Creatinine, Ser 0.90  0.50 - 1.35 mg/dL   Glucose, Bld 115 (*) 70 - 99 mg/dL   Calcium, Ion 1.15  1.13 - 1.30 mmol/L   TCO2 37  0 - 100 mmol/L   Hemoglobin 16.0  13.0 - 17.0 g/dL   HCT 47.0  39.0 - 52.0 %   No results found.  MDM  I spoke with Dr.Bensimon here patient has long-standing history of PVCs and PACs. He exhibits PACs on the monitor while here. Blood pressure has come down without treatment. He feels well and ready for  discharge at 9 AM. No further change in treatment today. Plan patient to call office tomorrow to arrange to be seen this week Final diagnoses:  None   diagnosis #1 palpitations #2 hypertension      Orlie Dakin, MD 07/29/14 [phone]

## 2014-07-30 ENCOUNTER — Telehealth: Payer: Self-pay | Admitting: Cardiology

## 2014-07-30 ENCOUNTER — Ambulatory Visit: Payer: Medicare Other | Admitting: Internal Medicine

## 2014-07-30 NOTE — Telephone Encounter (Signed)
Pt called in stating that he was admitted in to hospital on 9/13 due to that fact that his BP was elevated to 205/105. He would like to come in to see Dr. Martinique to talked with him about this. Please call  Thanks

## 2014-07-30 NOTE — Telephone Encounter (Signed)
Returned call to patient he stated he went to [facility] ER yesterday 07/29/14 with elevated B/P 205/105 and irregular heart beat.Stated every time he has shot in his left eye for macular degeneration his B/P will go up and heart beat irregular.Stated he gets a shot every 8 weeks and for the past 1 year this seems to happen.Stated he just received shot last Tuesday 07/24/14.Stated ER Dr. told him to make appointment.Appt.schedueld with Kerin Ransom 07/31/14.Stated B/P better this morning 150/80,heart beat regular.Advised keep appt.with Lurena Joiner 07/31/14 at 1:30 pm.

## 2014-07-31 ENCOUNTER — Ambulatory Visit (INDEPENDENT_AMBULATORY_CARE_PROVIDER_SITE_OTHER): Payer: Medicare Other | Admitting: Cardiology

## 2014-07-31 ENCOUNTER — Encounter: Payer: Self-pay | Admitting: Cardiology

## 2014-07-31 VITALS — BP 126/78 | HR 63 | Ht 73.0 in | Wt 176.5 lb

## 2014-07-31 DIAGNOSIS — I2510 Atherosclerotic heart disease of native coronary artery without angina pectoris: Secondary | ICD-10-CM

## 2014-07-31 DIAGNOSIS — R002 Palpitations: Secondary | ICD-10-CM

## 2014-07-31 DIAGNOSIS — I493 Ventricular premature depolarization: Secondary | ICD-10-CM

## 2014-07-31 DIAGNOSIS — I4949 Other premature depolarization: Secondary | ICD-10-CM | POA: Diagnosis not present

## 2014-07-31 DIAGNOSIS — I10 Essential (primary) hypertension: Secondary | ICD-10-CM | POA: Diagnosis not present

## 2014-07-31 DIAGNOSIS — I491 Atrial premature depolarization: Secondary | ICD-10-CM

## 2014-07-31 DIAGNOSIS — H3530 Unspecified macular degeneration: Secondary | ICD-10-CM

## 2014-07-31 NOTE — Assessment & Plan Note (Signed)
He receives injections for this.

## 2014-07-31 NOTE — Assessment & Plan Note (Addendum)
He has had labile HTN. His Cozaar dose is now back to 25 mg BID and his B/P is controlled

## 2014-07-31 NOTE — Patient Instructions (Signed)
Keep appointment with Dr Martinique   NO CHANGE WITH CURRENT MEDICATION

## 2014-07-31 NOTE — Assessment & Plan Note (Signed)
Seen in the ER 07/29/14 with palpitations and accelerated HTN.

## 2014-07-31 NOTE — Progress Notes (Signed)
07/31/2014 Joseph Simpson   1935/04/18  [id number]  Primary Physicia Chancy Hurter, MD Primary Cardiologist: Dr Martinique  HPI:  Pleasant 79 y/o ,male followed by Dr Martinique with HTN, COPD, and palpitations. He had an abnormal Myoview in 2013, this was followed by a cath Dec 2013 which revealed minor CAD. He has had issues with HTN. He as taken off an ACE earlier this year and placed on an ARB. He has had this dose adjusted up for elevated B/P, then cut back for dizziness and weakness.           He had been doing well till last week. He went for for his eye injection Tuesday. By Thursday he noted his B/P was elevated and his Cozaar was increased to 50 mg BID. He went to the ER Sunday after he awakened around 2 am with palpitations. In the ER his EKG showed PVCs and PACs and his B/P was initially over 200. All this resolved on its own. Since then he is back on 25 mg of Cozaar BID and feels fine.    Current Outpatient Prescriptions  Medication Sig Dispense Refill  . acetaminophen (TYLENOL) 500 MG tablet Take 1,000 mg by mouth every 6 (six) hours as needed. PAIN        . albuterol (PROAIR HFA) 108 (90 BASE) MCG/ACT inhaler Inhale 2 puffs into the lungs every 6 (six) hours as needed for wheezing or shortness of breath.  1 Inhaler  3  . bevacizumab (AVASTIN) 2.5 mg/0.1 mL SOLN 2.5 mg. Injection to eye every 8 weeks      . losartan (COZAAR) 100 MG tablet Take 50 mg by mouth 2 (two) times daily.      . metoCLOPramide (REGLAN) 5 MG tablet Take 5 mg by mouth 2 (two) times daily.      . metoprolol tartrate (LOPRESSOR) 25 MG tablet Take 1 tablet (25 mg total) by mouth 2 (two) times daily.  180 tablet  3  . pantoprazole (PROTONIX) 40 MG tablet Take 40 mg by mouth daily.       [name] Glycol-Propyl Glycol (SYSTANE ULTRA) 0.4-0.3 % SOLN Apply 1 drop to eye 3 (three) times daily.        . tamsulosin (FLOMAX) 0.4 MG CAPS capsule Take 0.4 mg by mouth as needed.       . triamcinolone (KENALOG) 0.025 %  ointment Apply 1 application topically as needed.       Marland Kitchen VIGAMOX 0.5 % ophthalmic solution Take every 8 weeks as directed       No current facility-administered medications for this visit.    Allergies  Allergen Reactions  . Aspirin Other (See Comments)    UPSET STOMACH  . Percocet [Oxycodone-Acetaminophen] Nausea And Vomiting    History   Social History  . Marital Status: Married    Spouse Name: N/A    Number of Children: 1  . Years of Education: N/A   Occupational History  . Not on file.   Social History Main Topics  . Smoking status: Former Smoker -- 0.10 packs/day for 4 years    Types: Cigarettes    Quit date: 09/03/1956  . Smokeless tobacco: Never Used  . Alcohol Use: No  . Drug Use: No  . Sexual Activity: Yes   Other Topics Concern  . Not on file   Social History Narrative  . No narrative on file     Review of Systems: General: negative for chills, fever, night sweats or  weight changes.  Cardiovascular: negative for chest pain, dyspnea on exertion, edema, orthopnea, palpitations, paroxysmal nocturnal dyspnea or shortness of breath Dermatological: negative for rash Respiratory: Chronic DOE Urologic: negative for hematuria Abdominal: negative for nausea, vomiting, diarrhea, bright red blood per rectum, melena, or hematemesis Neurologic: negative for visual changes, syncope, or dizziness All other systems reviewed and are otherwise negative except as noted above.    Blood pressure 126/78, pulse 63, height 6\' 1"  (1.854 m), weight 176 lb 8 oz (80.06 kg).  General appearance: alert, cooperative and no distress Neck: no carotid bruit and no JVD Lungs: clear to auscultation bilaterally Heart: regular rate and rhythm  EKG NSR, NSST changes   ASSESSMENT AND PLAN:   Palpitations Seen in the ER 07/29/14 with palpitations and accelerated HTN.  PVC's (premature ventricular contractions) .  PAC (premature atrial contraction) .  HYPERTENSION He has had  labile HTN. His Cozaar dose is now back to 25 mg BID and his B/P is controlled  Macular degeneration He receives injections for this.   PLAN  I did not change Joseph Simpson medications. In retrospect, he thinks these episodes come on a few days after he gets his eyes injected. In any event, his B/P is fine today and his palpitations have resolved. He will keep his follow up with Dr Martinique [facility] 07/31/2014 2:31 PM

## 2014-09-05 ENCOUNTER — Ambulatory Visit (INDEPENDENT_AMBULATORY_CARE_PROVIDER_SITE_OTHER): Payer: Medicare Other

## 2014-09-05 DIAGNOSIS — Z23 Encounter for immunization: Secondary | ICD-10-CM

## 2014-09-05 DIAGNOSIS — H903 Sensorineural hearing loss, bilateral: Secondary | ICD-10-CM | POA: Diagnosis not present

## 2014-09-05 DIAGNOSIS — H9313 Tinnitus, bilateral: Secondary | ICD-10-CM | POA: Diagnosis not present

## 2014-09-05 DIAGNOSIS — R42 Dizziness and giddiness: Secondary | ICD-10-CM | POA: Diagnosis not present

## 2014-09-05 DIAGNOSIS — H8143 Vertigo of central origin, bilateral: Secondary | ICD-10-CM | POA: Diagnosis not present

## 2014-09-06 DIAGNOSIS — Z85828 Personal history of other malignant neoplasm of skin: Secondary | ICD-10-CM | POA: Diagnosis not present

## 2014-09-06 DIAGNOSIS — D294 Benign neoplasm of scrotum: Secondary | ICD-10-CM | POA: Diagnosis not present

## 2014-09-06 DIAGNOSIS — L570 Actinic keratosis: Secondary | ICD-10-CM | POA: Diagnosis not present

## 2014-09-06 DIAGNOSIS — Z85820 Personal history of malignant melanoma of skin: Secondary | ICD-10-CM | POA: Diagnosis not present

## 2014-09-06 DIAGNOSIS — L820 Inflamed seborrheic keratosis: Secondary | ICD-10-CM | POA: Diagnosis not present

## 2014-09-18 ENCOUNTER — Encounter (INDEPENDENT_AMBULATORY_CARE_PROVIDER_SITE_OTHER): Payer: Medicare Other | Admitting: Ophthalmology

## 2014-09-18 DIAGNOSIS — H3531 Nonexudative age-related macular degeneration: Secondary | ICD-10-CM | POA: Diagnosis not present

## 2014-09-18 DIAGNOSIS — I10 Essential (primary) hypertension: Secondary | ICD-10-CM | POA: Diagnosis not present

## 2014-09-18 DIAGNOSIS — H35033 Hypertensive retinopathy, bilateral: Secondary | ICD-10-CM

## 2014-09-18 DIAGNOSIS — D3132 Benign neoplasm of left choroid: Secondary | ICD-10-CM

## 2014-09-18 DIAGNOSIS — H43813 Vitreous degeneration, bilateral: Secondary | ICD-10-CM

## 2014-09-18 DIAGNOSIS — H3532 Exudative age-related macular degeneration: Secondary | ICD-10-CM

## 2014-10-03 ENCOUNTER — Encounter: Payer: Self-pay | Admitting: Cardiology

## 2014-10-03 ENCOUNTER — Ambulatory Visit (INDEPENDENT_AMBULATORY_CARE_PROVIDER_SITE_OTHER): Payer: Medicare Other | Admitting: Cardiology

## 2014-10-03 VITALS — BP 140/80 | HR 72 | Ht 73.0 in | Wt 180.6 lb

## 2014-10-03 DIAGNOSIS — I2510 Atherosclerotic heart disease of native coronary artery without angina pectoris: Secondary | ICD-10-CM

## 2014-10-03 DIAGNOSIS — E785 Hyperlipidemia, unspecified: Secondary | ICD-10-CM

## 2014-10-03 DIAGNOSIS — I10 Essential (primary) hypertension: Secondary | ICD-10-CM | POA: Diagnosis not present

## 2014-10-03 DIAGNOSIS — I493 Ventricular premature depolarization: Secondary | ICD-10-CM

## 2014-10-03 NOTE — Patient Instructions (Signed)
Continue your current therapy  I will see you in 6 months.   

## 2014-10-03 NOTE — Progress Notes (Signed)
Joseph Simpson Date of Birth: 05/29/1935 Medical Record #[id number]  History of Present Illness: Joseph Simpson is seen today for follow up of CAD and HTN. He has had an abnormal stress test myoview in 11/13 with abnormal EKG/runs of SVT and marked ST-T wave changes. Images were normal. Had cardiac cath showing minimal nonobstructive CAD and normal LV function. Managed medically with emphasis on BP control.  He complained of palpitations. A 24-hour Holter monitor  demonstrated occasional PVCs and rare PACs. He had no significant pauses or bradycardia. His minimal heart rate was 55 beats per minute with a mean heart rate of 84 beats per minute. We increased his metoprolol dose and since then he notes improvement in his palpitations.  Previously he developed significant symptoms of lightheadedness, presyncope, and fatigue that improved with reduction in BP meds. He reports that BP increased for 8 days in September up to 200/100 then came back down. Now BP running from 133-160. He feels very well. No lightheadedness. No chest pain or SOB. Energy level is good.   Current Outpatient Prescriptions on File Prior to Visit  Medication Sig Dispense Refill  . acetaminophen (TYLENOL) 500 MG tablet Take 1,000 mg by mouth every 6 (six) hours as needed. PAIN      . albuterol (PROAIR HFA) 108 (90 BASE) MCG/ACT inhaler Inhale 2 puffs into the lungs every 6 (six) hours as needed for wheezing or shortness of breath. 1 Inhaler 3  . bevacizumab (AVASTIN) 2.5 mg/0.1 mL SOLN 2.5 mg. Injection to eye every 8 weeks    . losartan (COZAAR) 100 MG tablet Take 50 mg by mouth 2 (two) times daily.    . metoCLOPramide (REGLAN) 5 MG tablet Take 5 mg by mouth 2 (two) times daily.    . metoprolol tartrate (LOPRESSOR) 25 MG tablet Take 1 tablet (25 mg total) by mouth 2 (two) times daily. 180 tablet 3  . pantoprazole (PROTONIX) 40 MG tablet Take 40 mg by mouth daily.     [name] Glycol-Propyl Glycol (SYSTANE ULTRA) 0.4-0.3 % SOLN  Apply 1 drop to eye 3 (three) times daily.      . tamsulosin (FLOMAX) 0.4 MG CAPS capsule Take 0.4 mg by mouth as needed.     . triamcinolone (KENALOG) 0.025 % ointment Apply 1 application topically as needed.     Marland Kitchen VIGAMOX 0.5 % ophthalmic solution Take every 8 weeks as directed     No current facility-administered medications on file prior to visit.    Allergies  Allergen Reactions  . Aspirin Other (See Comments)    UPSET STOMACH  . Percocet [Oxycodone-Acetaminophen] Nausea And Vomiting    Past Medical History  Diagnosis Date  . GERD (gastroesophageal reflux disease)   . Hypertension   . Syncope   . OSA (obstructive sleep apnea)   . Melanoma   . Dyslipidemia   . Macular degeneration   . Abnormal stress test December 2013    s/p cardiac cath with minimal nonobstructive CAD and normal LV function; medical management recommended  . SVT (supraventricular tachycardia)   . CAD (coronary artery disease)   . PVC's (premature ventricular contractions) 09/27/2013    Past Surgical History  Procedure Laterality Date  . Gastrectomy    . Hemorrhoid surgery    . Melanoma excision  05-05-2007    LEFT FOREARM  . Nose surgery    . Cataract extraction    . Tonsillectomy    . Appendectomy    . Cystoscopy/retrograde/ureteroscopy/stone extraction with basket  02/24/2012    Procedure: CYSTOSCOPY/RETROGRADE/URETEROSCOPY/STONE EXTRACTION WITH BASKET;  Surgeon: Franchot Gallo, MD;  Location: [facility];  Service: Urology;  Laterality: Bilateral;  1 HOUR  . Cardiac catheterization  December 2013    minimal nonobstructive CAD with normal LV function    History  Smoking status  . Former Smoker -- 0.10 packs/day for 4 years  . Types: Cigarettes  . Quit date: 09/03/1956  Smokeless tobacco  . Never Used    History  Alcohol Use No    Family History  Problem Relation Age of Onset  . Heart disease Mother     CABG in her 64s  . Pneumonia Father   . Cancer Sister      breast    Review of Systems: The review of systems is per the HPI.  All other systems were reviewed and are negative.  Physical Exam: BP 140/80 mmHg  Pulse 72  Ht 6\' 1"  (1.854 m)  Wt 180 lb 9.6 oz (81.92 kg)  BMI 23.83 kg/m2 Patient is very pleasant and in no acute distress. Skin is warm and dry. Color is normal.  HEENT is unremarkable. Normocephalic/atraumatic. PERRL. Sclera are nonicteric. Neck is supple. No masses. No JVD. Lungs are clear. Cardiac exam shows a regular rate and rhythm with few extrasystoles. Normal S1-2. No gallop or murmur. Abdomen is soft. Extremities are without edema. Gait and ROM are intact. No gross neurologic deficits noted.   LABORATORY DATA: Lab Results  Component Value Date   WBC 6.9 03/29/2014   HGB 16.0 07/29/2014   HCT 47.0 07/29/2014   PLT 177.0 03/29/2014   GLUCOSE 115* 07/29/2014   CHOL 197 08/25/2013   TRIG 101.0 08/25/2013   HDL 42.90 08/25/2013   LDLCALC 134* 08/25/2013   ALT 21 03/29/2014   AST 22 03/29/2014   NA 139 07/29/2014   K 3.8 07/29/2014   CL 105 07/29/2014   CREATININE 0.90 07/29/2014   BUN 7 07/29/2014   CO2 33* 03/29/2014   TSH 2.00 08/25/2013   PSA 2.42 10/02/2010   INR 1.0 10/18/2012    Assessment / Plan: 1. CAD - minimal nonobstructive disease by cath. Managed medically. Doing well clinically.   2. HTN - fair control on meds now.  Would avoid overly aggressive BP control given prior problems with presyncope.   3. SVT/bradycardia/PVCs- Normal EF by cath and myoview. Symptoms improved with metoprolol. Continue current therapy.  4. Granulomatous lung disease. Followed by Dr. Halford Chessman.

## 2014-10-22 DIAGNOSIS — K4090 Unilateral inguinal hernia, without obstruction or gangrene, not specified as recurrent: Secondary | ICD-10-CM | POA: Diagnosis not present

## 2014-10-22 DIAGNOSIS — N401 Enlarged prostate with lower urinary tract symptoms: Secondary | ICD-10-CM | POA: Diagnosis not present

## 2014-10-22 DIAGNOSIS — N210 Calculus in bladder: Secondary | ICD-10-CM | POA: Diagnosis not present

## 2014-10-24 ENCOUNTER — Other Ambulatory Visit: Payer: Self-pay | Admitting: Family Medicine

## 2014-10-24 NOTE — Telephone Encounter (Signed)
Pt was a pt of [facility] Key he is calling to req a rx on losartan (COZAAR) 100 MG tablet. Insurance company would not refill because it is to [employer].  Pt said he was taking 1/2 tablet twice a day per [facility]. The rx was for 1/2 tablet daily   Pt said rx was suppose to 50 mg ..    Pharmacy ; [place]

## 2014-10-24 NOTE — Telephone Encounter (Signed)
Is this ok Dr. Hunter? 

## 2014-10-25 MED ORDER — LOSARTAN POTASSIUM 100 MG PO TABS: 50.0000 mg | ORAL_TABLET | Freq: Two times a day (BID) | ORAL | Status: DC

## 2014-10-25 NOTE — Telephone Encounter (Signed)
Yes ok to refill. Sounds like 100 mg take 1/2 tab twice a day is the right order.

## 2014-10-25 NOTE — Telephone Encounter (Signed)
Rx sent to pharmacy   

## 2014-11-02 DIAGNOSIS — K219 Gastro-esophageal reflux disease without esophagitis: Secondary | ICD-10-CM | POA: Diagnosis not present

## 2014-11-02 DIAGNOSIS — K3184 Gastroparesis: Secondary | ICD-10-CM | POA: Diagnosis not present

## 2014-11-13 ENCOUNTER — Encounter (INDEPENDENT_AMBULATORY_CARE_PROVIDER_SITE_OTHER): Payer: Medicare Other | Admitting: Ophthalmology

## 2014-11-13 DIAGNOSIS — I10 Essential (primary) hypertension: Secondary | ICD-10-CM

## 2014-11-13 DIAGNOSIS — H2511 Age-related nuclear cataract, right eye: Secondary | ICD-10-CM

## 2014-11-13 DIAGNOSIS — H3532 Exudative age-related macular degeneration: Secondary | ICD-10-CM | POA: Diagnosis not present

## 2014-11-13 DIAGNOSIS — H3531 Nonexudative age-related macular degeneration: Secondary | ICD-10-CM

## 2014-11-13 DIAGNOSIS — H35033 Hypertensive retinopathy, bilateral: Secondary | ICD-10-CM

## 2014-11-13 DIAGNOSIS — D3132 Benign neoplasm of left choroid: Secondary | ICD-10-CM

## 2014-11-13 DIAGNOSIS — H43813 Vitreous degeneration, bilateral: Secondary | ICD-10-CM | POA: Diagnosis not present

## 2014-11-23 ENCOUNTER — Encounter: Payer: Self-pay | Admitting: Family Medicine

## 2014-11-23 ENCOUNTER — Ambulatory Visit (INDEPENDENT_AMBULATORY_CARE_PROVIDER_SITE_OTHER): Payer: Medicare Other | Admitting: Family Medicine

## 2014-11-23 VITALS — BP 130/62 | Temp 98.0°F | Wt 183.0 lb

## 2014-11-23 DIAGNOSIS — I10 Essential (primary) hypertension: Secondary | ICD-10-CM

## 2014-11-23 DIAGNOSIS — I2510 Atherosclerotic heart disease of native coronary artery without angina pectoris: Secondary | ICD-10-CM

## 2014-11-23 DIAGNOSIS — I493 Ventricular premature depolarization: Secondary | ICD-10-CM

## 2014-11-23 DIAGNOSIS — R002 Palpitations: Secondary | ICD-10-CM

## 2014-11-23 DIAGNOSIS — Z23 Encounter for immunization: Secondary | ICD-10-CM

## 2014-11-23 DIAGNOSIS — N400 Enlarged prostate without lower urinary tract symptoms: Secondary | ICD-10-CM | POA: Insufficient documentation

## 2014-11-23 NOTE — Assessment & Plan Note (Addendum)
Extensive eval by Dr. Martinique including cath with only nonobstructive CAD. Symptoms have improved with metoprolol. He still has recurrence if he exerts himself such as mowing yard/cleaning house- he avoids these activities so exercise is limited. Likely PVC related though could be SVT with history of this.

## 2014-11-23 NOTE — Assessment & Plan Note (Signed)
Controlled on Losartan 100mg , metoprolol 25mg  BID

## 2014-11-23 NOTE — Patient Instructions (Addendum)
Let's see each other in 6 months to check in.   No changes in medication.   Glad your palpitations are better.   Prevnar today (final pneumonia vaccine)

## 2014-11-23 NOTE — Assessment & Plan Note (Signed)
Focus on BP management. Cannot take aspirin due to gastrectomy history.

## 2014-11-23 NOTE — Progress Notes (Signed)
Joseph Reddish, MD Phone: [phone]  Subjective:  Patient presents today to establish care with me as their new primary care provider. Patient was formerly a patient of Dr. Leanne Chang. Chief complaint-noted.   Nonobstructive CAD-stable ASA causes terrible stomach pain after partial gastrectomy so cannot be used. Mianly BP control.  ROS- no chest pain or shortness of breath. Palpitations as below.   Hypertension-controlled  Palpitations/PVCs/history SVT-controlled on metoprolol and with reduced activity BP Readings from Last 3 Encounters:  11/23/14 130/62  10/03/14 140/80  07/31/14 126/78  palpitations with increased activity. Resolves after 5-6 hours. Completely independent except for house cleaning due to palpitations. Regarding SOB, not using inhaler.  Home BP monitoring-no Compliant with medications-yes without side effects ROS-Denies any CP, HA, SOB, blurry vision, LE edema  The following were reviewed and entered/updated in epic: Past Medical History  Diagnosis Date  . GERD (gastroesophageal reflux disease)   . Hypertension   . Syncope   . OSA (obstructive sleep apnea)   . Melanoma   . Dyslipidemia   . Macular degeneration   . Abnormal stress test December 2013    s/p cardiac cath with minimal nonobstructive CAD and normal LV function; medical management recommended  . SVT (supraventricular tachycardia)   . CAD (coronary artery disease)   . PVC's (premature ventricular contractions) 09/27/2013  . Obstructive sleep apnea 09/23/2007    Wore cpap previously. Stopped and not interested despite risks.      Patient Active Problem List   Diagnosis Date Noted  . BPH (benign prostatic hyperplasia) 11/23/2014    Priority: Medium  . Palpitations 07/31/2014    Priority: Medium  . Pulmonary nodule 05/10/2014    Priority: Medium  . Dyspnea 02/27/2014    Priority: Medium  . CAD (coronary artery disease)     Priority: Medium  . MELANOMA, ARM 09/26/2007    Priority: Medium  .  Essential hypertension 09/23/2007    Priority: Medium  . Dyslipidemia     Priority: Low  . Murmur 02/10/2012    Priority: Low  . Macular degeneration 11/17/2011    Priority: Low  . Nephrolithiasis 09/04/2011    Priority: Low  . GERD 09/23/2007    Priority: Low  . Cough 05/10/2014   Past Surgical History  Procedure Laterality Date  . Gastrectomy    . Hemorrhoid surgery    . Melanoma excision  05-05-2007    LEFT FOREARM  . Nose surgery    . Cataract extraction    . Tonsillectomy    . Appendectomy    . Cystoscopy/retrograde/ureteroscopy/stone extraction with basket  02/24/2012    Procedure: CYSTOSCOPY/RETROGRADE/URETEROSCOPY/STONE EXTRACTION WITH BASKET;  Surgeon: Franchot Gallo, MD;  Location: [facility];  Service: Urology;  Laterality: Bilateral;  1 HOUR  . Cardiac catheterization  December 2013    minimal nonobstructive CAD with normal LV function    Family History  Problem Relation Age of Onset  . Heart disease Mother     CABG in her 78s  . Pneumonia Father   . Cancer Sister     breast  . Lung cancer Brother     smoker    Medications- reviewed and updated Current Outpatient Prescriptions  Medication Sig Dispense Refill  . bevacizumab (AVASTIN) 2.5 mg/0.1 mL SOLN 2.5 mg. Injection to eye every 8 weeks    . losartan (COZAAR) 100 MG tablet Take 0.5 tablets (50 mg total) by mouth 2 (two) times daily. 90 tablet 0  . metoCLOPramide (REGLAN) 5 MG tablet Take 5 mg  by mouth 2 (two) times daily.    . metoprolol tartrate (LOPRESSOR) 25 MG tablet Take 1 tablet (25 mg total) by mouth 2 (two) times daily. 180 tablet 3  . pantoprazole (PROTONIX) 40 MG tablet Take 40 mg by mouth daily.     [name] Glycol-Propyl Glycol (SYSTANE ULTRA) 0.4-0.3 % SOLN Apply 1 drop to eye 3 (three) times daily.      . tamsulosin (FLOMAX) 0.4 MG CAPS capsule Take 0.4 mg by mouth as needed.     Marland Kitchen acetaminophen (TYLENOL) 500 MG tablet Take 1,000 mg by mouth every 6 (six) hours as  needed. PAIN      . albuterol (PROAIR HFA) 108 (90 BASE) MCG/ACT inhaler Inhale 2 puffs into the lungs every 6 (six) hours as needed for wheezing or shortness of breath. (Patient not taking: Reported on 11/23/2014) 1 Inhaler 3  . VIGAMOX 0.5 % ophthalmic solution Take every 8 weeks as directed     Allergies-reviewed and updated Allergies  Allergen Reactions  . Aspirin Other (See Comments)    UPSET STOMACH  . Percocet [Oxycodone-Acetaminophen] Nausea And Vomiting    History   Social History  . Marital Status: Married    Spouse Name: N/A    Number of Children: 1  . Years of Education: N/A   Social History Main Topics  . Smoking status: Former Smoker -- 0.10 packs/day for 4 years    Types: Cigarettes    Quit date: 09/03/1956  . Smokeless tobacco: Never Used  . Alcohol Use: No  . Drug Use: No  . Sexual Activity: Yes   Other Topics Concern  . 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       Hobbies: ride motorcycle (slingshot 3 year), rabbit and dear hunt    ROS--See HPI   Objective: BP 130/62 mmHg  Temp(Src) 98 F (36.7 C)  Wt 183 lb (83.008 kg) Gen: NAD, resting comfortably CV: RRR no murmurs rubs or gallops Lungs: CTAB no crackles, wheeze, rhonchi Abdomen: soft/nontender/nondistended/normal bowel sounds.  Ext: no edema, 2+ PT pulses Skin: warm, dry Neuro: grossly normal, moves all extremities, PERRLA   Assessment/Plan:  CAD (coronary artery disease) Focus on BP management. Cannot take aspirin due to gastrectomy history.   Essential hypertension Controlled on Losartan 100mg , metoprolol 25mg  BID  Palpitations Extensive eval by Dr. Martinique including cath with only nonobstructive CAD. Symptoms have improved with metoprolol. He still has recurrence if he exerts himself such as mowing  yard/cleaning house- he avoids these activities so exercise is limited. Likely PVC related though could be SVT with history of this.   6 month follow up.   Orders Placed This Encounter  Procedures  . Pneumococcal conjugate vaccine 13-valent

## 2015-01-22 ENCOUNTER — Other Ambulatory Visit: Payer: Self-pay | Admitting: *Deleted

## 2015-01-22 ENCOUNTER — Encounter (INDEPENDENT_AMBULATORY_CARE_PROVIDER_SITE_OTHER): Payer: Medicare Other | Admitting: Ophthalmology

## 2015-01-22 DIAGNOSIS — I10 Essential (primary) hypertension: Secondary | ICD-10-CM

## 2015-01-22 DIAGNOSIS — H3531 Nonexudative age-related macular degeneration: Secondary | ICD-10-CM

## 2015-01-22 DIAGNOSIS — H43813 Vitreous degeneration, bilateral: Secondary | ICD-10-CM

## 2015-01-22 DIAGNOSIS — D3132 Benign neoplasm of left choroid: Secondary | ICD-10-CM | POA: Diagnosis not present

## 2015-01-22 DIAGNOSIS — H2511 Age-related nuclear cataract, right eye: Secondary | ICD-10-CM | POA: Diagnosis not present

## 2015-01-22 DIAGNOSIS — H35033 Hypertensive retinopathy, bilateral: Secondary | ICD-10-CM | POA: Diagnosis not present

## 2015-01-22 DIAGNOSIS — H3532 Exudative age-related macular degeneration: Secondary | ICD-10-CM | POA: Diagnosis not present

## 2015-01-22 MED ORDER — LOSARTAN POTASSIUM 100 MG PO TABS: 50.0000 mg | ORAL_TABLET | Freq: Two times a day (BID) | ORAL | Status: DC

## 2015-02-18 ENCOUNTER — Other Ambulatory Visit: Payer: Self-pay | Admitting: *Deleted

## 2015-02-18 MED ORDER — METOPROLOL TARTRATE 25 MG PO TABS: 25.0000 mg | ORAL_TABLET | Freq: Two times a day (BID) | ORAL | Status: DC

## 2015-02-27 DIAGNOSIS — D2230 Melanocytic nevi of unspecified part of face: Secondary | ICD-10-CM | POA: Diagnosis not present

## 2015-02-27 DIAGNOSIS — Z85828 Personal history of other malignant neoplasm of skin: Secondary | ICD-10-CM | POA: Diagnosis not present

## 2015-02-27 DIAGNOSIS — L570 Actinic keratosis: Secondary | ICD-10-CM | POA: Diagnosis not present

## 2015-02-27 DIAGNOSIS — Z87898 Personal history of other specified conditions: Secondary | ICD-10-CM | POA: Diagnosis not present

## 2015-03-04 ENCOUNTER — Ambulatory Visit (INDEPENDENT_AMBULATORY_CARE_PROVIDER_SITE_OTHER): Payer: Medicare Other | Admitting: Family Medicine

## 2015-03-04 ENCOUNTER — Encounter: Payer: Self-pay | Admitting: Family Medicine

## 2015-03-04 VITALS — BP 120/68 | HR 65 | Temp 98.2°F | Wt 185.0 lb

## 2015-03-04 DIAGNOSIS — M7052 Other bursitis of knee, left knee: Secondary | ICD-10-CM | POA: Diagnosis not present

## 2015-03-04 MED ORDER — DICLOFENAC SODIUM 1 % TD GEL: 2.0000 g | Freq: Four times a day (QID) | TRANSDERMAL | Status: DC

## 2015-03-04 NOTE — Progress Notes (Signed)
Garret Reddish, MD Phone: [phone]  Subjective:   Joseph Simpson is a 80 y.o. year old very pleasant male patient who presents with the following:  "Left Swollen painful knot below knee" -noted knot Saturday morning, became progressively red and painful throughout the day and by Sunday morning had some difficulty walking due to pain, later in the day improved as he took some tylenol. Describes as a mild to moderate ache. Tylenol improves pain. Has not tried any other treatments. Cannot take oral nsaids. No history of gout. No radiation of pain. Today pain 0/10 if not pushing on area and moderate pain if pushing on it.   ROS- no fever/chills/nausea/vomtiing/expanding redness.   Past Medical History- Patient Active Problem List   Diagnosis Date Noted  . BPH (benign prostatic hyperplasia) 11/23/2014    Priority: Medium  . Palpitations 07/31/2014    Priority: Medium  . Pulmonary nodule 05/10/2014    Priority: Medium  . Dyspnea 02/27/2014    Priority: Medium  . CAD (coronary artery disease)     Priority: Medium  . MELANOMA, ARM 09/26/2007    Priority: Medium  . Essential hypertension 09/23/2007    Priority: Medium  . Dyslipidemia     Priority: Low  . Murmur 02/10/2012    Priority: Low  . Macular degeneration 11/17/2011    Priority: Low  . Nephrolithiasis 09/04/2011    Priority: Low  . GERD 09/23/2007    Priority: Low  . Cough 05/10/2014   Medications- reviewed and updated Current Outpatient Prescriptions  Medication Sig Dispense Refill  . losartan (COZAAR) 100 MG tablet Take 0.5 tablets (50 mg total) by mouth 2 (two) times daily. 90 tablet 3  . metoCLOPramide (REGLAN) 5 MG tablet Take 5 mg by mouth 2 (two) times daily.    . metoprolol tartrate (LOPRESSOR) 25 MG tablet Take 1 tablet (25 mg total) by mouth 2 (two) times daily. 180 tablet 3  . pantoprazole (PROTONIX) 40 MG tablet Take 40 mg by mouth daily.     [name] Glycol-Propyl Glycol (SYSTANE ULTRA) 0.4-0.3 %  SOLN Apply 1 drop to eye 3 (three) times daily.      . tamsulosin (FLOMAX) 0.4 MG CAPS capsule Take 0.4 mg by mouth as needed.     Marland Kitchen VIGAMOX 0.5 % ophthalmic solution Take every 8 weeks as directed    . acetaminophen (TYLENOL) 500 MG tablet Take 1,000 mg by mouth every 6 (six) hours as needed. PAIN      . albuterol (PROAIR HFA) 108 (90 BASE) MCG/ACT inhaler Inhale 2 puffs into the lungs every 6 (six) hours as needed for wheezing or shortness of breath. (Patient not taking: Reported on 11/23/2014) 1 Inhaler 3  . bevacizumab (AVASTIN) 2.5 mg/0.1 mL SOLN 2.5 mg. Injection to eye every 8 weeks       Objective: BP 120/68 mmHg  Pulse 65  Temp(Src) 98.2 F (36.8 C)  Wt 185 lb (83.915 kg) Gen: NAD, resting comfortably  No effusion or baker's cyst behind left knee.  Right knee normal  L knee infrapatellar bursa fluid filled with mild erythema, swelling. Tender to touch. Mild warmth.   Assessment/Plan:  Infrapatellar Bursitis -discussed aspiration with crystal analysis, cell counts, culture vs. Conservative management and watchful waiting. Patient opts conservative so instructed TID icing 20 minutes. Cannot take oral nsaids due to history partial gastrectomy so given voltaren gel to use as well.   Return precautions advised. Thursday or Friday follow up aspiration if persists or sooner if worsens for  aspiration.   Meds ordered this encounter  Medications  . diclofenac sodium (VOLTAREN) 1 % GEL    Sig: Apply 2 g topically 4 (four) times daily. Cannot take oral nsaids due to partial gastrectomy. For infrapatellar bursitis.    Dispense:  100 g    Refill:  0

## 2015-03-04 NOTE — Patient Instructions (Signed)
Bursitis below the knee (inflammed fluid filled pouch).  Treat conservatively with icing 3x a day for 20 minutes.  Also try to get topical antiinflammatory approved-sent to pharmacy-have them call us if insurance doesn't cover.   Follow up Thursday or Friday if not doing better or sooner if worsens and we will pull fluid off to see if anything else is going on.

## 2015-04-09 ENCOUNTER — Encounter (INDEPENDENT_AMBULATORY_CARE_PROVIDER_SITE_OTHER): Payer: Medicare Other | Admitting: Ophthalmology

## 2015-04-09 DIAGNOSIS — I10 Essential (primary) hypertension: Secondary | ICD-10-CM

## 2015-04-09 DIAGNOSIS — D3132 Benign neoplasm of left choroid: Secondary | ICD-10-CM | POA: Diagnosis not present

## 2015-04-09 DIAGNOSIS — H35033 Hypertensive retinopathy, bilateral: Secondary | ICD-10-CM

## 2015-04-09 DIAGNOSIS — H3532 Exudative age-related macular degeneration: Secondary | ICD-10-CM

## 2015-04-09 DIAGNOSIS — H43813 Vitreous degeneration, bilateral: Secondary | ICD-10-CM

## 2015-04-09 DIAGNOSIS — H3531 Nonexudative age-related macular degeneration: Secondary | ICD-10-CM

## 2015-04-11 ENCOUNTER — Telehealth: Payer: Self-pay | Admitting: Cardiology

## 2015-04-11 NOTE — Telephone Encounter (Signed)
Joseph Simpson is calling to find out if he can take Benadryl .Marland Kitchen Please call   Thanks

## 2015-04-11 NOTE — Telephone Encounter (Signed)
Pt. Informed that it was ok to take benadryl per Dr . Martinique

## 2015-04-22 ENCOUNTER — Ambulatory Visit (INDEPENDENT_AMBULATORY_CARE_PROVIDER_SITE_OTHER): Payer: Medicare Other | Admitting: Cardiology

## 2015-04-22 ENCOUNTER — Encounter: Payer: Self-pay | Admitting: Cardiology

## 2015-04-22 VITALS — BP 152/88 | HR 70 | Ht 73.0 in | Wt 186.2 lb

## 2015-04-22 DIAGNOSIS — E785 Hyperlipidemia, unspecified: Secondary | ICD-10-CM

## 2015-04-22 DIAGNOSIS — I2510 Atherosclerotic heart disease of native coronary artery without angina pectoris: Secondary | ICD-10-CM | POA: Diagnosis not present

## 2015-04-22 DIAGNOSIS — I10 Essential (primary) hypertension: Secondary | ICD-10-CM | POA: Diagnosis not present

## 2015-04-22 NOTE — Progress Notes (Signed)
Joseph Simpson Date of Birth: [birth year] Medical Record #[id number]  History of Present Illness: Joseph Simpson is seen today for follow up of CAD and HTN. He has had an abnormal stress test myoview in 11/13 with abnormal EKG/runs of SVT and marked ST-T wave changes. Images were normal. Had cardiac cath showing minimal nonobstructive CAD and normal LV function. Managed medically with emphasis on BP control.  He complained of palpitations. A 24-hour Holter monitor  demonstrated occasional PVCs and rare PACs. He had no significant pauses or bradycardia. His minimal heart rate was 55 beats per minute with a mean heart rate of 84 beats per minute. We increased his metoprolol dose and since then he notes improvement in his palpitations.  Previously he developed significant symptoms of lightheadedness, presyncope, and fatigue that improved with reduction in BP meds.  On follow up today he reports he is doing well. 2 weeks ago he developed a head cold. After a prolonged coughing spell he noted increased ectopy with skipped beats every 3-4 beats. This lasted about 8 hrs then resolved. Was only taking benadryl. Denies any SOB or chest pain. Still can't stay out in the sun very long because he gets lightheaded. States BP generally stays less than 150 over 70-80. He does try to walk daily in the evening.   Current Outpatient Prescriptions on File Prior to Visit  Medication Sig Dispense Refill  . acetaminophen (TYLENOL) 500 MG tablet Take 1,000 mg by mouth every 6 (six) hours as needed. PAIN      . albuterol (PROAIR HFA) 108 (90 BASE) MCG/ACT inhaler Inhale 2 puffs into the lungs every 6 (six) hours as needed for wheezing or shortness of breath. 1 Inhaler 3  . bevacizumab (AVASTIN) 2.5 mg/0.1 mL SOLN 2.5 mg. Injection to eye every 8 weeks    . diclofenac sodium (VOLTAREN) 1 % GEL Apply 2 g topically 4 (four) times daily. Cannot take oral nsaids due to partial gastrectomy. For infrapatellar bursitis. 100 g 0  .  losartan (COZAAR) 100 MG tablet Take 0.5 tablets (50 mg total) by mouth 2 (two) times daily. 90 tablet 3  . metoCLOPramide (REGLAN) 5 MG tablet Take 5 mg by mouth 2 (two) times daily.    . metoprolol tartrate (LOPRESSOR) 25 MG tablet Take 1 tablet (25 mg total) by mouth 2 (two) times daily. 180 tablet 3  . pantoprazole (PROTONIX) 40 MG tablet Take 40 mg by mouth daily.     [name] Glycol-Propyl Glycol (SYSTANE ULTRA) 0.4-0.3 % SOLN Apply 1 drop to eye 3 (three) times daily.      . tamsulosin (FLOMAX) 0.4 MG CAPS capsule Take 0.4 mg by mouth as needed.     Marland Kitchen VIGAMOX 0.5 % ophthalmic solution Take every 8 weeks as directed     No current facility-administered medications on file prior to visit.    Allergies  Allergen Reactions  . Aspirin Other (See Comments)    UPSET STOMACH  . Percocet [Oxycodone-Acetaminophen] Nausea And Vomiting  . Vitamins [Apatate] Other (See Comments)    Bothers stomach    Past Medical History  Diagnosis Date  . GERD (gastroesophageal reflux disease)   . Hypertension   . Syncope   . OSA (obstructive sleep apnea)   . Melanoma   . Dyslipidemia   . Macular degeneration   . Abnormal stress test December 2013    s/p cardiac cath with minimal nonobstructive CAD and normal LV function; medical management recommended  . SVT (supraventricular tachycardia)   .  CAD (coronary artery disease)   . PVC's (premature ventricular contractions) 09/27/2013  . Obstructive sleep apnea 09/23/2007    Wore cpap previously. Stopped and not interested despite risks.       Past Surgical History  Procedure Laterality Date  . Gastrectomy    . Hemorrhoid surgery    . Melanoma excision  05-05-2007    LEFT FOREARM  . Nose surgery    . Cataract extraction    . Tonsillectomy    . Appendectomy    . Cystoscopy/retrograde/ureteroscopy/stone extraction with basket  02/24/2012    Procedure: CYSTOSCOPY/RETROGRADE/URETEROSCOPY/STONE EXTRACTION WITH BASKET;  Surgeon: Franchot Gallo,  MD;  Location: [facility];  Service: Urology;  Laterality: Bilateral;  1 HOUR  . Cardiac catheterization  December 2013    minimal nonobstructive CAD with normal LV function    History  Smoking status  . Former Smoker -- 0.10 packs/day for 4 years  . Types: Cigarettes  . Quit date: 09/03/1956  Smokeless tobacco  . Never Used    History  Alcohol Use No    Family History  Problem Relation Age of Onset  . Heart disease Mother     CABG in her 55s  . Pneumonia Father   . Cancer Sister     breast  . Lung cancer Brother     smoker    Review of Systems: The review of systems is per the HPI.  All other systems were reviewed and are negative.  Physical Exam: BP 152/88 mmHg  Pulse 70  Ht 6\' 1"  (1.854 m)  Wt 84.46 kg (186 lb 3.2 oz)  BMI 24.57 kg/m2 Patient is very pleasant and in no acute distress. Skin is warm and dry. Color is normal.  HEENT is unremarkable. Normocephalic/atraumatic. PERRL. Sclera are nonicteric. Neck is supple. No masses. No JVD. Lungs are clear. Cardiac exam shows a regular rate and rhythm with few extrasystoles. Normal S1-2. No gallop or murmur. Abdomen is soft. Extremities are without edema. Gait and ROM are intact. No gross neurologic deficits noted.   LABORATORY DATA: Lab Results  Component Value Date   WBC 6.9 03/29/2014   HGB 16.0 07/29/2014   HCT 47.0 07/29/2014   PLT 177.0 03/29/2014   GLUCOSE 115* 07/29/2014   CHOL 197 08/25/2013   TRIG 101.0 08/25/2013   HDL 42.90 08/25/2013   LDLCALC 134* 08/25/2013   ALT 21 03/29/2014   AST 22 03/29/2014   NA 139 07/29/2014   K 3.8 07/29/2014   CL 105 07/29/2014   CREATININE 0.90 07/29/2014   BUN 7 07/29/2014   CO2 33* 03/29/2014   TSH 2.00 08/25/2013   PSA 2.42 10/02/2010   INR 1.0 10/18/2012    Assessment / Plan: 1. CAD - minimal nonobstructive disease by cath. Managed medically. Doing well clinically.   2. HTN - fair control on meds now.  Would avoid overly aggressive BP  control given prior problems with presyncope- especially with continued symptoms in the sun.   3. SVT/bradycardia/PVCs- Normal EF by cath and myoview. Symptoms improved with metoprolol. Continue current therapy.  4. Granulomatous lung disease. Followed by Dr. Halford Chessman.

## 2015-04-22 NOTE — Patient Instructions (Signed)
Continue your current therapy  I will see you in 6 months.   

## 2015-05-22 DIAGNOSIS — H6123 Impacted cerumen, bilateral: Secondary | ICD-10-CM | POA: Diagnosis not present

## 2015-05-27 ENCOUNTER — Ambulatory Visit: Payer: Medicare Other | Admitting: Family Medicine

## 2015-05-27 ENCOUNTER — Encounter: Payer: Self-pay | Admitting: Family Medicine

## 2015-05-27 ENCOUNTER — Ambulatory Visit (INDEPENDENT_AMBULATORY_CARE_PROVIDER_SITE_OTHER): Payer: Medicare Other | Admitting: Family Medicine

## 2015-05-27 VITALS — BP 118/80 | HR 72 | Temp 98.4°F | Wt 185.0 lb

## 2015-05-27 DIAGNOSIS — I2510 Atherosclerotic heart disease of native coronary artery without angina pectoris: Secondary | ICD-10-CM

## 2015-05-27 DIAGNOSIS — R911 Solitary pulmonary nodule: Secondary | ICD-10-CM

## 2015-05-27 DIAGNOSIS — E785 Hyperlipidemia, unspecified: Secondary | ICD-10-CM | POA: Diagnosis not present

## 2015-05-27 DIAGNOSIS — I10 Essential (primary) hypertension: Secondary | ICD-10-CM | POA: Diagnosis not present

## 2015-05-27 LAB — CBC
HCT: 46.4 % (ref 39.0–52.0)
Hemoglobin: 15.7 g/dL (ref 13.0–17.0)
MCHC: 33.9 g/dL (ref 30.0–36.0)
MCV: 95.8 fl (ref 78.0–100.0)
Platelets: 145 10*3/uL — ABNORMAL LOW (ref 150.0–400.0)
RBC: 4.84 Mil/uL (ref 4.22–5.81)
RDW: 13.2 % (ref 11.5–15.5)
WBC: 5.8 10*3/uL (ref 4.0–10.5)

## 2015-05-27 LAB — TSH: TSH: 2.89 u[IU]/mL (ref 0.35–4.50)

## 2015-05-27 LAB — LIPID PANEL
Cholesterol: 167 mg/dL (ref 0–200)
HDL: 31.2 mg/dL — ABNORMAL LOW (ref >39.00)
LDL Cholesterol: 109 mg/dL — ABNORMAL HIGH (ref 0–99)
NonHDL: 135.8
Total CHOL/HDL Ratio: 5
Triglycerides: 134 mg/dL (ref 0.0–149.0)
VLDL: 26.8 mg/dL (ref 0.0–40.0)

## 2015-05-27 LAB — COMPREHENSIVE METABOLIC PANEL
ALT: 25 U/L (ref 0–53)
AST: 24 U/L (ref 0–37)
Albumin: 4.1 g/dL (ref 3.5–5.2)
Alkaline Phosphatase: 54 U/L (ref 39–117)
BUN: 15 mg/dL (ref 6–23)
CO2: 33 mEq/L — ABNORMAL HIGH (ref 19–32)
Calcium: 9.7 mg/dL (ref 8.4–10.5)
Chloride: 102 mEq/L (ref 96–112)
Creatinine, Ser: 0.85 mg/dL (ref 0.40–1.50)
GFR: 92.53 mL/min (ref >60.00)
Glucose, Bld: 97 mg/dL (ref 70–99)
Potassium: 4 mEq/L (ref 3.5–5.1)
Sodium: 140 mEq/L (ref 135–145)
Total Bilirubin: 1 mg/dL (ref 0.2–1.2)
Total Protein: 7.1 g/dL (ref 6.0–8.3)

## 2015-05-27 NOTE — Progress Notes (Signed)
Garret Reddish, MD  Subjective:  Joseph Simpson is a 80 y.o. year old very pleasant male patient who presents with:  See problem oriented charting ROS-Some aching in bilateral forearms and below knee on left few months, right chest. Happens randomly every few days. Mild and not disabling. No shortness of breath, diaphoresis, nausea with these episodes. Also no chest pain at baseline.   Past Medical History- BPH, melanoma hx followed by Dr. Delman Cheadle  Medications- reviewed and updated Current Outpatient Prescriptions  Medication Sig Dispense Refill  . acetaminophen (TYLENOL) 500 MG tablet Take 1,000 mg by mouth every 6 (six) hours as needed. PAIN      . albuterol (PROAIR HFA) 108 (90 BASE) MCG/ACT inhaler Inhale 2 puffs into the lungs every 6 (six) hours as needed for wheezing or shortness of breath. (Patient not taking: Reported on 05/27/2015) 1 Inhaler 3  . bevacizumab (AVASTIN) 2.5 mg/0.1 mL SOLN 2.5 mg. Injection to eye every 8 weeks    . diclofenac sodium (VOLTAREN) 1 % GEL Apply 2 g topically 4 (four) times daily. Cannot take oral nsaids due to partial gastrectomy. For infrapatellar bursitis. 100 g 0  . losartan (COZAAR) 100 MG tablet Take 0.5 tablets (50 mg total) by mouth 2 (two) times daily. 90 tablet 3  . metoCLOPramide (REGLAN) 5 MG tablet Take 5 mg by mouth 2 (two) times daily.    . metoprolol tartrate (LOPRESSOR) 25 MG tablet Take 1 tablet (25 mg total) by mouth 2 (two) times daily. 180 tablet 3  . pantoprazole (PROTONIX) 40 MG tablet Take 40 mg by mouth daily.     [name] Glycol-Propyl Glycol (SYSTANE ULTRA) 0.4-0.3 % SOLN Apply 1 drop to eye 3 (three) times daily.      . tamsulosin (FLOMAX) 0.4 MG CAPS capsule Take 0.4 mg by mouth as needed.     Marland Kitchen VIGAMOX 0.5 % ophthalmic solution Take every 8 weeks as directed     Objective: BP 118/80 mmHg  Pulse 72  Temp(Src) 98.4 F (36.9 C)  Wt 185 lb (83.915 kg) Gen: NAD, resting comfortably TM normal, oropharynx normal CV: RRR 2/6  murmur RUSB, no rubs or gallops Lungs: CTAB no crackles, wheeze, rhonchi Abdomen: soft/nontender/nondistended/normal bowel sounds. No rebound or guarding.  Ext: no edema Skin: warm, dry Neuro: grossly normal, moves all extremities   Assessment/Plan:  Pulmonary nodule S: has not returned to pulm in 6 months as planned, asks [place] to follow up pulmonary nodule. This was noted 03/2015 A/P: repeat high resolution CT as that was imaging choice at last visit. Patient with 4 pack years and quit 59 and highly doubt malignancy but will repeat to be sure. Left message with radiology could change to noncontrast CT if they believe would be adequate for comparison.    Essential hypertension S: excellent control on Losartan 50mg , metoprolol 25mg  BID A/P: continue current rx   CAD (coronary artery disease) S: minimal nonobstructive by cath 2014. Asymptomatic.  A/P: cannot tolerate aspirin. Sees cards and they have not recommended statin given nonobstructive. We will continue to follow closely and check labs today- consider statin only if worsening from prior baseline evaluated by cards. ASA causes pain so cannot take   Dyslipidemia S: cardiology has not advised statin previously. Suspect poor control.  A/P: we will repeat lipids today, consider statin if increase from previous levels that have been seen by cards    We are planning on 1 year follow up as sees Dr. Martinique every 6 months, depending  on lipids, may reach out to Dr. Martinique.   Fasting Orders Placed This Encounter  Procedures  . CT Chest High Resolution    SS/ Hilda Blades [phone] xt [phone]/ [employer] NPR    Standing Status: Future     Number of Occurrences:      Standing Expiration Date: 07/27/2016    Order Specific Question:  Reason for Exam (SYMPTOM  OR DIAGNOSIS REQUIRED)    Answer:  pulmonary nodule follow up. could be CT chest noncontrast if radiology prefers-but original was high resolution and orderedthis on repeat for better  vizualization.    Order Specific Question:  Preferred imaging location?    Answer:  [facility]  . CBC    [place]  . Comprehensive metabolic panel    [place]    Order Specific Question:  Has the patient fasted?    Answer:  No  . Lipid panel    [place]    Order Specific Question:  Has the patient fasted?    Answer:  No  . TSH    [place]

## 2015-05-27 NOTE — Assessment & Plan Note (Addendum)
S: minimal nonobstructive by cath 2014. Asymptomatic.  A/P: cannot tolerate aspirin. Sees cards and they have not recommended statin given nonobstructive. We will continue to follow closely and check labs today- consider statin only if worsening from prior baseline evaluated by cards. ASA causes pain so cannot take

## 2015-05-27 NOTE — Patient Instructions (Addendum)
You are doing well. Blood presure in good place.   Only thing we need to do today is follow up your pulmonary nodule. We will call you within a week about your referral to for this CT. If you do not hear within 2 weeks, give [place] a call.   Check in 6 months

## 2015-05-27 NOTE — Assessment & Plan Note (Signed)
S: excellent control on Losartan 50mg , metoprolol 25mg  BID A/P: continue current rx

## 2015-05-27 NOTE — Assessment & Plan Note (Signed)
S: cardiology has not advised statin previously. Suspect poor control.  A/P: we will repeat lipids today, consider statin if increase from previous levels that have been seen by cards

## 2015-05-27 NOTE — Assessment & Plan Note (Signed)
S: has not returned to pulm in 6 months as planned, asks [place] to follow up pulmonary nodule. This was noted 03/2015 A/P: repeat high resolution CT as that was imaging choice at last visit. Patient with 4 pack years and quit 59 and highly doubt malignancy but will repeat to be sure. Left message with radiology could change to noncontrast CT if they believe would be adequate for comparison.

## 2015-06-03 ENCOUNTER — Telehealth: Payer: Self-pay | Admitting: Cardiology

## 2015-06-03 NOTE — Telephone Encounter (Signed)
Returned call to patient he stated he has noticed for the past 2 to 3 weeks when he is out in the sun B/P is lower.Ranging 129/63,133/65,130/62,152/70.Pulse 60's.Advised B/P readings are ok.Advised to stay out of hot sun.Stay hydrated.Continue medications.Will let Dr.Jordan know you called.

## 2015-06-03 NOTE — Telephone Encounter (Signed)
Please call,having problems with his blood pressure. °

## 2015-06-04 ENCOUNTER — Ambulatory Visit (INDEPENDENT_AMBULATORY_CARE_PROVIDER_SITE_OTHER)
Admission: RE | Admit: 2015-06-04 | Discharge: 2015-06-04 | Disposition: A | Discharge status: Home / Self Care | Payer: Medicare Other | Source: Ambulatory Visit | Attending: Family Medicine | Admitting: Family Medicine

## 2015-06-04 DIAGNOSIS — I2510 Atherosclerotic heart disease of native coronary artery without angina pectoris: Secondary | ICD-10-CM | POA: Diagnosis not present

## 2015-06-04 DIAGNOSIS — J479 Bronchiectasis, uncomplicated: Secondary | ICD-10-CM | POA: Diagnosis not present

## 2015-06-04 DIAGNOSIS — R911 Solitary pulmonary nodule: Secondary | ICD-10-CM

## 2015-06-04 DIAGNOSIS — I700 Atherosclerosis of aorta: Secondary | ICD-10-CM | POA: Diagnosis not present

## 2015-07-02 ENCOUNTER — Encounter (INDEPENDENT_AMBULATORY_CARE_PROVIDER_SITE_OTHER): Payer: Medicare Other | Admitting: Ophthalmology

## 2015-07-02 DIAGNOSIS — I10 Essential (primary) hypertension: Secondary | ICD-10-CM

## 2015-07-02 DIAGNOSIS — H3532 Exudative age-related macular degeneration: Secondary | ICD-10-CM | POA: Diagnosis not present

## 2015-07-02 DIAGNOSIS — H3531 Nonexudative age-related macular degeneration: Secondary | ICD-10-CM | POA: Diagnosis not present

## 2015-07-02 DIAGNOSIS — H43813 Vitreous degeneration, bilateral: Secondary | ICD-10-CM | POA: Diagnosis not present

## 2015-07-02 DIAGNOSIS — H35033 Hypertensive retinopathy, bilateral: Secondary | ICD-10-CM

## 2015-08-12 ENCOUNTER — Encounter: Payer: Self-pay | Admitting: Family Medicine

## 2015-08-12 ENCOUNTER — Ambulatory Visit (INDEPENDENT_AMBULATORY_CARE_PROVIDER_SITE_OTHER): Payer: Medicare Other | Admitting: Family Medicine

## 2015-08-12 VITALS — BP 148/81 | HR 64 | Temp 98.4°F | Ht 73.0 in | Wt 187.0 lb

## 2015-08-12 DIAGNOSIS — I2510 Atherosclerotic heart disease of native coronary artery without angina pectoris: Secondary | ICD-10-CM | POA: Diagnosis not present

## 2015-08-12 DIAGNOSIS — R233 Spontaneous ecchymoses: Secondary | ICD-10-CM

## 2015-08-12 DIAGNOSIS — I10 Essential (primary) hypertension: Secondary | ICD-10-CM | POA: Diagnosis not present

## 2015-08-12 LAB — TSH: TSH: 2.46 u[IU]/mL (ref 0.35–4.50)

## 2015-08-12 LAB — CBC WITH DIFFERENTIAL/PLATELET
Basophils Absolute: 0 10*3/uL (ref 0.0–0.1)
Basophils Relative: 0.6 % (ref 0.0–3.0)
Eosinophils Absolute: 0.2 10*3/uL (ref 0.0–0.7)
Eosinophils Relative: 3.5 % (ref 0.0–5.0)
HCT: 47.2 % (ref 39.0–52.0)
Hemoglobin: 15.9 g/dL (ref 13.0–17.0)
Lymphocytes Relative: 21.6 % (ref 12.0–46.0)
Lymphs Abs: 1.2 10*3/uL (ref 0.7–4.0)
MCHC: 33.7 g/dL (ref 30.0–36.0)
MCV: 96.6 fl (ref 78.0–100.0)
Monocytes Absolute: 0.4 10*3/uL (ref 0.1–1.0)
Monocytes Relative: 7.8 % (ref 3.0–12.0)
Neutro Abs: 3.8 10*3/uL (ref 1.4–7.7)
Neutrophils Relative %: 66.5 % (ref 43.0–77.0)
Platelets: 155 10*3/uL (ref 150.0–400.0)
RBC: 4.89 Mil/uL (ref 4.22–5.81)
RDW: 13.3 % (ref 11.5–15.5)
WBC: 5.7 10*3/uL (ref 4.0–10.5)

## 2015-08-12 LAB — BASIC METABOLIC PANEL
BUN: 15 mg/dL (ref 6–23)
CO2: 29 mEq/L (ref 19–32)
Calcium: 9.8 mg/dL (ref 8.4–10.5)
Chloride: 101 mEq/L (ref 96–112)
Creatinine, Ser: 0.73 mg/dL (ref 0.40–1.50)
GFR: 110.24 mL/min (ref >60.00)
Glucose, Bld: 117 mg/dL — ABNORMAL HIGH (ref 70–99)
Potassium: 4.4 mEq/L (ref 3.5–5.1)
Sodium: 141 mEq/L (ref 135–145)

## 2015-08-12 LAB — HEPATIC FUNCTION PANEL
ALT: 27 U/L (ref 0–53)
AST: 27 U/L (ref 0–37)
Albumin: 4.5 g/dL (ref 3.5–5.2)
Alkaline Phosphatase: 55 U/L (ref 39–117)
Bilirubin, Direct: 0.2 mg/dL (ref 0.0–0.3)
Total Bilirubin: 1.1 mg/dL (ref 0.2–1.2)
Total Protein: 7.1 g/dL (ref 6.0–8.3)

## 2015-08-12 NOTE — Progress Notes (Signed)
   Subjective:    Patient ID: Joseph Simpson, male    DOB: 13-Jan-1935, 80 y.o.   MRN: [id number]  HPI Here for 2 days of a red rash around both ankles. There are no other symptoms. He has never had this before. No recent medication changes. Of note, he had labs drawn this past July which show a slightly low platelet count at 145 K.    Review of Systems  Constitutional: Negative.   Respiratory: Negative.   Cardiovascular: Negative.   Gastrointestinal: Negative.   Endocrine: Negative.        Objective:   Physical Exam  Constitutional: He appears well-developed and well-nourished.  Neck: Neck supple. No thyromegaly present.  Cardiovascular: Normal rate, regular rhythm, normal heart sounds and intact distal pulses.   Pulmonary/Chest: Effort normal and breath sounds normal.  Musculoskeletal: He exhibits no edema.  Lymphadenopathy:    He has no cervical adenopathy.  Skin:  Patches of red petechiae around both ankles           Assessment & Plan:  Petechiae of uncertain etiology. Get labs today. Of special concern will be the platelet count today.

## 2015-08-12 NOTE — Progress Notes (Signed)
Pre visit review using our clinic review tool, if applicable. No additional management support is needed unless otherwise documented below in the visit note. 

## 2015-08-29 DIAGNOSIS — D2230 Melanocytic nevi of unspecified part of face: Secondary | ICD-10-CM | POA: Diagnosis not present

## 2015-08-29 DIAGNOSIS — Z87898 Personal history of other specified conditions: Secondary | ICD-10-CM | POA: Diagnosis not present

## 2015-08-29 DIAGNOSIS — D0439 Carcinoma in situ of skin of other parts of face: Secondary | ICD-10-CM | POA: Diagnosis not present

## 2015-08-29 DIAGNOSIS — D485 Neoplasm of uncertain behavior of skin: Secondary | ICD-10-CM | POA: Diagnosis not present

## 2015-08-29 DIAGNOSIS — L570 Actinic keratosis: Secondary | ICD-10-CM | POA: Diagnosis not present

## 2015-08-29 DIAGNOSIS — Z85828 Personal history of other malignant neoplasm of skin: Secondary | ICD-10-CM | POA: Diagnosis not present

## 2015-08-29 DIAGNOSIS — D0461 Carcinoma in situ of skin of right upper limb, including shoulder: Secondary | ICD-10-CM | POA: Diagnosis not present

## 2015-08-29 DIAGNOSIS — D2271 Melanocytic nevi of right lower limb, including hip: Secondary | ICD-10-CM | POA: Diagnosis not present

## 2015-08-29 DIAGNOSIS — L821 Other seborrheic keratosis: Secondary | ICD-10-CM | POA: Diagnosis not present

## 2015-09-09 ENCOUNTER — Encounter: Payer: Self-pay | Admitting: Family Medicine

## 2015-09-09 DIAGNOSIS — D099 Carcinoma in situ, unspecified: Secondary | ICD-10-CM | POA: Insufficient documentation

## 2015-09-09 DIAGNOSIS — M19011 Primary osteoarthritis, right shoulder: Secondary | ICD-10-CM | POA: Diagnosis not present

## 2015-09-09 DIAGNOSIS — M1612 Unilateral primary osteoarthritis, left hip: Secondary | ICD-10-CM | POA: Diagnosis not present

## 2015-09-17 ENCOUNTER — Encounter (INDEPENDENT_AMBULATORY_CARE_PROVIDER_SITE_OTHER): Payer: Medicare Other | Admitting: Ophthalmology

## 2015-09-17 DIAGNOSIS — D3132 Benign neoplasm of left choroid: Secondary | ICD-10-CM | POA: Diagnosis not present

## 2015-09-17 DIAGNOSIS — H35033 Hypertensive retinopathy, bilateral: Secondary | ICD-10-CM | POA: Diagnosis not present

## 2015-09-17 DIAGNOSIS — I10 Essential (primary) hypertension: Secondary | ICD-10-CM | POA: Diagnosis not present

## 2015-09-17 DIAGNOSIS — H353231 Exudative age-related macular degeneration, bilateral, with active choroidal neovascularization: Secondary | ICD-10-CM | POA: Diagnosis not present

## 2015-09-17 DIAGNOSIS — H2511 Age-related nuclear cataract, right eye: Secondary | ICD-10-CM

## 2015-09-17 DIAGNOSIS — H43813 Vitreous degeneration, bilateral: Secondary | ICD-10-CM | POA: Diagnosis not present

## 2015-09-20 DIAGNOSIS — D0461 Carcinoma in situ of skin of right upper limb, including shoulder: Secondary | ICD-10-CM | POA: Diagnosis not present

## 2015-09-20 DIAGNOSIS — D0430 Carcinoma in situ of skin of unspecified part of face: Secondary | ICD-10-CM | POA: Diagnosis not present

## 2015-09-24 ENCOUNTER — Ambulatory Visit (INDEPENDENT_AMBULATORY_CARE_PROVIDER_SITE_OTHER): Payer: Medicare Other

## 2015-09-24 DIAGNOSIS — Z23 Encounter for immunization: Secondary | ICD-10-CM | POA: Diagnosis not present

## 2015-10-25 ENCOUNTER — Ambulatory Visit (INDEPENDENT_AMBULATORY_CARE_PROVIDER_SITE_OTHER): Payer: Medicare Other | Admitting: Cardiology

## 2015-10-25 ENCOUNTER — Encounter: Payer: Self-pay | Admitting: Cardiology

## 2015-10-25 VITALS — BP 122/68 | HR 68 | Ht 73.0 in | Wt 185.1 lb

## 2015-10-25 DIAGNOSIS — I2510 Atherosclerotic heart disease of native coronary artery without angina pectoris: Secondary | ICD-10-CM | POA: Diagnosis not present

## 2015-10-25 DIAGNOSIS — I10 Essential (primary) hypertension: Secondary | ICD-10-CM

## 2015-10-25 DIAGNOSIS — I491 Atrial premature depolarization: Secondary | ICD-10-CM

## 2015-10-25 HISTORY — DX: Atrial premature depolarization: I49.1

## 2015-10-25 NOTE — Progress Notes (Signed)
Joseph Simpson Date of Birth: 14-Apr-1935 Medical Record [id number]  History of Present Illness: [name] is seen today for follow up of CAD and HTN. He has had an abnormal stress test myoview in 11/13 with abnormal EKG/runs of SVT and marked ST-T wave changes. Images were normal. Had cardiac cath showing minimal nonobstructive CAD and normal LV function. Managed medically with emphasis on BP control.  He complained of palpitations. A 24-hour Holter monitor  demonstrated occasional PVCs and rare PACs. He had no significant pauses or bradycardia. His minimal heart rate was 55 beats per minute with a mean heart rate of 84 beats per minute. We increased his metoprolol dose and since then he notes improvement in his palpitations.  Previously he developed significant symptoms of lightheadedness, presyncope, and fatigue that improved with reduction in BP meds.  On follow up today he reports he is doing well. When he does a lot of housework he may notice more "skipping" but not doing regular activities. No chest pain. Breathing is doing well.   Current Outpatient Prescriptions on File Prior to Visit  Medication Sig Dispense Refill  . acetaminophen (TYLENOL) 500 MG tablet Take 1,000 mg by mouth every 6 (six) hours as needed. PAIN      . albuterol (PROAIR HFA) 108 (90 BASE) MCG/ACT inhaler Inhale 2 puffs into the lungs every 6 (six) hours as needed for wheezing or shortness of breath. 1 Inhaler 3  . bevacizumab (AVASTIN) 2.5 mg/0.1 mL SOLN 2.5 mg. Injection to eye every 8 weeks    . diclofenac sodium (VOLTAREN) 1 % GEL Apply 2 g topically 4 (four) times daily. Cannot take oral nsaids due to partial gastrectomy. For infrapatellar bursitis. 100 g 0  . losartan (COZAAR) 100 MG tablet Take 0.5 tablets (50 mg total) by mouth 2 (two) times daily. 90 tablet 3  . metoCLOPramide (REGLAN) 5 MG tablet Take 5 mg by mouth 2 (two) times daily.    . metoprolol tartrate (LOPRESSOR) 25 MG tablet Take 1 tablet (25 mg  total) by mouth 2 (two) times daily. 180 tablet 3  . pantoprazole (PROTONIX) 40 MG tablet Take 40 mg by mouth daily.     [name] Glycol-Propyl Glycol (SYSTANE ULTRA) 0.4-0.3 % SOLN Apply 1 drop to eye 3 (three) times daily.      . tamsulosin (FLOMAX) 0.4 MG CAPS capsule Take 0.4 mg by mouth as needed.     Marland Kitchen VIGAMOX 0.5 % ophthalmic solution Take every 8 weeks as directed     No current facility-administered medications on file prior to visit.    Allergies  Allergen Reactions  . Aspirin Other (See Comments)    UPSET STOMACH  . Percocet [Oxycodone-Acetaminophen] Nausea And Vomiting  . Vitamins [Apatate] Other (See Comments)    Bothers stomach    Past Medical History  Diagnosis Date  . GERD (gastroesophageal reflux disease)   . Hypertension   . Syncope   . OSA (obstructive sleep apnea)   . Melanoma ([place])   . Dyslipidemia   . Macular degeneration   . Abnormal stress test December 2013    s/p cardiac cath with minimal nonobstructive CAD and normal LV function; medical management recommended  . SVT (supraventricular tachycardia) ([place])   . CAD (coronary artery disease)   . PVC's (premature ventricular contractions) 09/27/2013  . Obstructive sleep apnea 09/23/2007    Wore cpap previously. Stopped and not interested despite risks.     Marland Kitchen PAC (premature atrial contraction) 10/25/2015  Past Surgical History  Procedure Laterality Date  . Gastrectomy    . Hemorrhoid surgery    . Melanoma excision  05-05-2007    LEFT FOREARM  . Nose surgery    . Cataract extraction    . Tonsillectomy    . Appendectomy    . Cystoscopy/retrograde/ureteroscopy/stone extraction with basket  02/24/2012    Procedure: CYSTOSCOPY/RETROGRADE/URETEROSCOPY/STONE EXTRACTION WITH BASKET;  Surgeon: Franchot Gallo, MD;  Location: [facility];  Service: Urology;  Laterality: Bilateral;  1 HOUR  . Cardiac catheterization  December 2013    minimal nonobstructive CAD with normal LV function     History  Smoking status  . Former Smoker -- 0.10 packs/day for 4 years  . Types: Cigarettes  . Quit date: 09/03/1956  Smokeless tobacco  . Never Used    History  Alcohol Use No    Family History  Problem Relation Age of Onset  . Heart disease Mother     CABG in her 73s  . Pneumonia Father   . Cancer Sister     breast  . Lung cancer Brother     smoker    Review of Systems: The review of systems is per the HPI.  All other systems were reviewed and are negative.  Physical Exam: BP 122/68 mmHg  Pulse 68  Ht 6\' 1"  (1.854 m)  Wt 83.972 kg (185 lb 2 oz)  BMI 24.43 kg/m2 Patient is very pleasant and in no acute distress. Skin is warm and dry. Color is normal.  HEENT is unremarkable. Normocephalic/atraumatic. PERRL. Sclera are nonicteric. Neck is supple. No masses. No JVD. Lungs are clear. Cardiac exam shows a regular rate and rhythm with few extrasystoles. Normal S1-2. No gallop or murmur. Abdomen is soft. Extremities are without edema. Gait and ROM are intact. No gross neurologic deficits noted.   LABORATORY DATA: Lab Results  Component Value Date   WBC 5.7 08/12/2015   HGB 15.9 08/12/2015   HCT 47.2 08/12/2015   PLT 155.0 08/12/2015   GLUCOSE 117* 08/12/2015   CHOL 167 05/27/2015   TRIG 134.0 05/27/2015   HDL 31.20* 05/27/2015   LDLCALC 109* 05/27/2015   ALT 27 08/12/2015   AST 27 08/12/2015   NA 141 08/12/2015   K 4.4 08/12/2015   CL 101 08/12/2015   CREATININE 0.73 08/12/2015   BUN 15 08/12/2015   CO2 29 08/12/2015   TSH 2.46 08/12/2015   PSA 2.42 10/02/2010   INR 1.0 10/18/2012   Ecg today shows NSR with nonspecific ST abnormality. Rate 68. I have personally reviewed and interpreted this study.  Assessment / Plan: 1. CAD - minimal nonobstructive disease by cath. Managed medically. Doing well clinically.   2. HTN - well controlled.   Would avoid overly aggressive BP control given prior problems with presyncope.  3. SVT/bradycardia/PVCs- Normal EF  by cath and myoview. Symptoms improved with metoprolol. Continue current therapy.  4. Granulomatous lung disease. Followed by Dr. Halford Chessman.

## 2015-10-25 NOTE — Patient Instructions (Signed)
Continue your current therapy  I will see you in one year   

## 2015-10-28 DIAGNOSIS — N210 Calculus in bladder: Secondary | ICD-10-CM | POA: Diagnosis not present

## 2015-10-28 DIAGNOSIS — N401 Enlarged prostate with lower urinary tract symptoms: Secondary | ICD-10-CM | POA: Diagnosis not present

## 2015-10-28 DIAGNOSIS — N138 Other obstructive and reflux uropathy: Secondary | ICD-10-CM | POA: Diagnosis not present

## 2015-10-28 DIAGNOSIS — K4090 Unilateral inguinal hernia, without obstruction or gangrene, not specified as recurrent: Secondary | ICD-10-CM | POA: Diagnosis not present

## 2015-12-04 DIAGNOSIS — L570 Actinic keratosis: Secondary | ICD-10-CM | POA: Diagnosis not present

## 2015-12-04 DIAGNOSIS — Z23 Encounter for immunization: Secondary | ICD-10-CM | POA: Diagnosis not present

## 2015-12-04 DIAGNOSIS — L720 Epidermal cyst: Secondary | ICD-10-CM | POA: Diagnosis not present

## 2015-12-06 DIAGNOSIS — K219 Gastro-esophageal reflux disease without esophagitis: Secondary | ICD-10-CM | POA: Diagnosis not present

## 2015-12-06 DIAGNOSIS — K3184 Gastroparesis: Secondary | ICD-10-CM | POA: Diagnosis not present

## 2015-12-10 ENCOUNTER — Encounter (INDEPENDENT_AMBULATORY_CARE_PROVIDER_SITE_OTHER): Payer: Medicare Other | Admitting: Ophthalmology

## 2015-12-10 DIAGNOSIS — H353231 Exudative age-related macular degeneration, bilateral, with active choroidal neovascularization: Secondary | ICD-10-CM | POA: Diagnosis not present

## 2015-12-10 DIAGNOSIS — H43813 Vitreous degeneration, bilateral: Secondary | ICD-10-CM | POA: Diagnosis not present

## 2015-12-10 DIAGNOSIS — I10 Essential (primary) hypertension: Secondary | ICD-10-CM | POA: Diagnosis not present

## 2015-12-10 DIAGNOSIS — H35033 Hypertensive retinopathy, bilateral: Secondary | ICD-10-CM | POA: Diagnosis not present

## 2015-12-10 DIAGNOSIS — H2511 Age-related nuclear cataract, right eye: Secondary | ICD-10-CM | POA: Diagnosis not present

## 2016-01-17 ENCOUNTER — Other Ambulatory Visit: Payer: Self-pay

## 2016-01-17 MED ORDER — LOSARTAN POTASSIUM 100 MG PO TABS: 50.0000 mg | ORAL_TABLET | Freq: Two times a day (BID) | ORAL | Status: DC

## 2016-01-30 ENCOUNTER — Other Ambulatory Visit: Payer: Self-pay | Admitting: *Deleted

## 2016-01-30 MED ORDER — METOPROLOL TARTRATE 25 MG PO TABS: 25.0000 mg | ORAL_TABLET | Freq: Two times a day (BID) | ORAL | Status: DC

## 2016-03-03 ENCOUNTER — Encounter (INDEPENDENT_AMBULATORY_CARE_PROVIDER_SITE_OTHER): Payer: Medicare Other | Admitting: Ophthalmology

## 2016-03-03 DIAGNOSIS — H2511 Age-related nuclear cataract, right eye: Secondary | ICD-10-CM | POA: Diagnosis not present

## 2016-03-03 DIAGNOSIS — H353231 Exudative age-related macular degeneration, bilateral, with active choroidal neovascularization: Secondary | ICD-10-CM | POA: Diagnosis not present

## 2016-03-03 DIAGNOSIS — I10 Essential (primary) hypertension: Secondary | ICD-10-CM

## 2016-03-03 DIAGNOSIS — H35033 Hypertensive retinopathy, bilateral: Secondary | ICD-10-CM

## 2016-03-03 DIAGNOSIS — D3132 Benign neoplasm of left choroid: Secondary | ICD-10-CM

## 2016-03-03 DIAGNOSIS — H43813 Vitreous degeneration, bilateral: Secondary | ICD-10-CM | POA: Diagnosis not present

## 2016-03-19 ENCOUNTER — Telehealth: Payer: Self-pay | Admitting: Cardiology

## 2016-03-19 NOTE — Telephone Encounter (Signed)
Returned call to patient.He stated he has a cold,runny nose,non productive cough.Advised ok to take plain mucinex.Advised no decongestant.Advised if not better he needs to see PCP.

## 2016-03-19 NOTE — Telephone Encounter (Signed)
NeW Message  Pt requested to speak w/ RN- pt c/o runny nose and other cold sympotms. Pt Wanted to know if he can take benadryl. Please call back and discuss.

## 2016-03-20 ENCOUNTER — Ambulatory Visit (INDEPENDENT_AMBULATORY_CARE_PROVIDER_SITE_OTHER): Payer: Medicare Other | Admitting: Family Medicine

## 2016-03-20 ENCOUNTER — Encounter: Payer: Self-pay | Admitting: Family Medicine

## 2016-03-20 VITALS — BP 108/68 | HR 88 | Temp 98.2°F | Ht 73.0 in | Wt 182.0 lb

## 2016-03-20 DIAGNOSIS — J069 Acute upper respiratory infection, unspecified: Secondary | ICD-10-CM | POA: Diagnosis not present

## 2016-03-20 MED ORDER — BENZONATATE 100 MG PO CAPS: 100.0000 mg | ORAL_CAPSULE | Freq: Two times a day (BID) | ORAL | Status: DC | PRN

## 2016-03-20 NOTE — Progress Notes (Signed)
Pre visit review using our clinic review tool, if applicable. No additional management support is needed unless otherwise documented below in the visit note. 

## 2016-03-20 NOTE — Patient Instructions (Signed)
Upper Respiratory infection History and exam today are suggestive of viral upper respiratory infection/common cold.  We discussed that a serious infection or illness is unlikely- no pneumonia or bronchitis on exam. We also discussed reasons why current illness does not meet criteria for bacterial illness and therefore no antibiotics indicated. Also educated on signs that bacterial infection may have developed.   Symptomatic treatment with: Mucinex for congestion along with Tessalon perles If the tessalon is too expensive- he can trial Mucinex-DM instaead  Finally, we reviewed reasons to return to care including if symptoms worsen or persist (past 2 weeks) or new concerns arise (especially fever or shortness of breath)  Meds ordered this encounter  Medications  . benzonatate (TESSALON) 100 MG capsule    Sig: Take 1 capsule (100 mg total) by mouth 2 (two) times daily as needed for cough.    Dispense:  20 capsule    Refill:  0

## 2016-03-20 NOTE — Progress Notes (Signed)
PCP: Garret Reddish, MD  Subjective:  Joseph Simpson is a 81 y.o. year old very pleasant male patient who presents with Upper Respiratory infection symptoms including nasal congestion, sore throat, cough with yellow sputum, rhinorrhea clear -started: 2 days ago and symptoms show no change -previous treatments: plain mucinex -sick contacts/travel/risks: denies flu exposure or other sick contacts -Hx of: allergies NO  ROS-denies fever, SOB, NVD, tooth pain or sinus pain  Pertinent Past Medical History-  Patient Active Problem List   Diagnosis Date Noted  . CAD (coronary artery disease)     Priority: High  . BPH (benign prostatic hyperplasia) 11/23/2014    Priority: Medium  . Palpitations 07/31/2014    Priority: Medium  . Pulmonary nodule 05/10/2014    Priority: Medium  . Dyspnea 02/27/2014    Priority: Medium  . MELANOMA, ARM 09/26/2007    Priority: Medium  . Essential hypertension 09/23/2007    Priority: Medium  . 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 Dyslipidemia     Priority: Low  . Murmur 02/10/2012    Priority: Low  . Macular degeneration 11/17/2011    Priority: Low  . Nephrolithiasis 09/04/2011    Priority: Low  . GERD 09/23/2007    Priority: Low  . PAC (premature atrial contraction) 10/25/2015  . Cough 05/10/2014   Medications- reviewed  Current Outpatient Prescriptions  Medication Sig Dispense Refill  . acetaminophen (TYLENOL) 500 MG tablet Take 1,000 mg by mouth every 6 (six) hours as needed. PAIN      . albuterol (PROAIR HFA) 108 (90 BASE) MCG/ACT inhaler Inhale 2 puffs into the lungs every 6 (six) hours as needed for wheezing or shortness of breath. 1 Inhaler 3  . bevacizumab (AVASTIN) 2.5 mg/0.1 mL SOLN 2.5 mg. Injection to eye every 8 weeks    . diclofenac sodium (VOLTAREN) 1 % GEL Apply 2 g topically 4 (four) times daily. Cannot take oral nsaids due to partial gastrectomy. For infrapatellar bursitis. 100 g 0  . losartan (COZAAR)  100 MG tablet Take 0.5 tablets (50 mg total) by mouth 2 (two) times daily. 90 tablet 3  . metoCLOPramide (REGLAN) 5 MG tablet Take 5 mg by mouth 2 (two) times daily.    . metoprolol tartrate (LOPRESSOR) 25 MG tablet Take 1 tablet (25 mg total) by mouth 2 (two) times daily. 180 tablet 3  . pantoprazole (PROTONIX) 40 MG tablet Take 40 mg by mouth daily.     [name] Glycol-Propyl Glycol (SYSTANE ULTRA) 0.4-0.3 % SOLN Apply 1 drop to eye 3 (three) times daily.      . tamsulosin (FLOMAX) 0.4 MG CAPS capsule Take 0.4 mg by mouth as needed.     Marland Kitchen VIGAMOX 0.5 % ophthalmic solution Take every 8 weeks as directed    . benzonatate (TESSALON) 100 MG capsule Take 1 capsule (100 mg total) by mouth 2 (two) times daily as needed for cough. 20 capsule 0   No current facility-administered medications for this visit.    Objective: BP 108/68 mmHg  Pulse 88  Temp(Src) 98.2 F (36.8 C) (Oral)  Ht 6\' 1"  (1.854 m)  Wt 182 lb (82.555 kg)  BMI 24.02 kg/m2  SpO2 95% Gen: NAD, resting comfortably HEENT: Turbinates erythematous, TM normal, pharynx mildly erythematous with no tonsilar exudate or edema, no sinus tenderness CV: RRR no murmurs rubs or gallops Lungs: CTAB no crackles, wheeze, rhonchi Abdomen: soft/nontender/nondistended/normal bowel sounds. No rebound or guarding.  Ext: no edema Skin:  warm, dry, no rash Neuro: grossly normal, moves all extremities  Assessment/Plan:  Upper Respiratory infection History and exam today are suggestive of viral upper respiratory infection/common cold.  We discussed that a serious infection or illness is unlikely- no pneumonia or bronchitis on exam. We also discussed reasons why current illness does not meet criteria for bacterial illness and therefore no antibiotics indicated. Also educated on signs that bacterial infection may have developed.   Symptomatic treatment with: Mucinex for congestion along with Tessalon perles If the tessalon is too expensive- he can  trial Mucinex-DM instaead  Finally, we reviewed reasons to return to care including if symptoms worsen or persist (past 2 weeks) or new concerns arise (especially fever or shortness of breath)  Meds ordered this encounter  Medications  . benzonatate (TESSALON) 100 MG capsule    Sig: Take 1 capsule (100 mg total) by mouth 2 (two) times daily as needed for cough.    Dispense:  20 capsule    Refill:  0

## 2016-04-09 DIAGNOSIS — Z87898 Personal history of other specified conditions: Secondary | ICD-10-CM | POA: Diagnosis not present

## 2016-04-09 DIAGNOSIS — Z85828 Personal history of other malignant neoplasm of skin: Secondary | ICD-10-CM | POA: Diagnosis not present

## 2016-04-09 DIAGNOSIS — D2271 Melanocytic nevi of right lower limb, including hip: Secondary | ICD-10-CM | POA: Diagnosis not present

## 2016-04-09 DIAGNOSIS — D045 Carcinoma in situ of skin of trunk: Secondary | ICD-10-CM | POA: Diagnosis not present

## 2016-04-09 DIAGNOSIS — D485 Neoplasm of uncertain behavior of skin: Secondary | ICD-10-CM | POA: Diagnosis not present

## 2016-04-09 DIAGNOSIS — D2230 Melanocytic nevi of unspecified part of face: Secondary | ICD-10-CM | POA: Diagnosis not present

## 2016-04-09 DIAGNOSIS — L570 Actinic keratosis: Secondary | ICD-10-CM | POA: Diagnosis not present

## 2016-04-09 LAB — DERMATOLOGY PATHOLOGY

## 2016-04-16 ENCOUNTER — Encounter: Payer: Self-pay | Admitting: Pediatrics

## 2016-04-29 DIAGNOSIS — K4090 Unilateral inguinal hernia, without obstruction or gangrene, not specified as recurrent: Secondary | ICD-10-CM | POA: Diagnosis not present

## 2016-04-29 DIAGNOSIS — N210 Calculus in bladder: Secondary | ICD-10-CM | POA: Diagnosis not present

## 2016-05-05 DIAGNOSIS — D045 Carcinoma in situ of skin of trunk: Secondary | ICD-10-CM | POA: Diagnosis not present

## 2016-05-13 ENCOUNTER — Ambulatory Visit: Payer: Self-pay | Admitting: Surgery

## 2016-05-13 DIAGNOSIS — K4090 Unilateral inguinal hernia, without obstruction or gangrene, not specified as recurrent: Secondary | ICD-10-CM | POA: Diagnosis not present

## 2016-05-25 ENCOUNTER — Encounter (INDEPENDENT_AMBULATORY_CARE_PROVIDER_SITE_OTHER): Payer: Medicare Other | Admitting: Ophthalmology

## 2016-05-25 DIAGNOSIS — I10 Essential (primary) hypertension: Secondary | ICD-10-CM

## 2016-05-25 DIAGNOSIS — H26492 Other secondary cataract, left eye: Secondary | ICD-10-CM

## 2016-05-25 DIAGNOSIS — H2511 Age-related nuclear cataract, right eye: Secondary | ICD-10-CM | POA: Diagnosis not present

## 2016-05-25 DIAGNOSIS — H35033 Hypertensive retinopathy, bilateral: Secondary | ICD-10-CM

## 2016-05-25 DIAGNOSIS — H43813 Vitreous degeneration, bilateral: Secondary | ICD-10-CM | POA: Diagnosis not present

## 2016-05-25 DIAGNOSIS — H353231 Exudative age-related macular degeneration, bilateral, with active choroidal neovascularization: Secondary | ICD-10-CM | POA: Diagnosis not present

## 2016-05-26 ENCOUNTER — Ambulatory Visit: Payer: PRIVATE HEALTH INSURANCE | Admitting: Family Medicine

## 2016-06-01 ENCOUNTER — Other Ambulatory Visit (INDEPENDENT_AMBULATORY_CARE_PROVIDER_SITE_OTHER): Payer: Medicare Other | Admitting: Ophthalmology

## 2016-06-01 DIAGNOSIS — H26492 Other secondary cataract, left eye: Secondary | ICD-10-CM

## 2016-06-04 ENCOUNTER — Ambulatory Visit: Payer: PRIVATE HEALTH INSURANCE | Admitting: Family Medicine

## 2016-06-10 ENCOUNTER — Encounter: Payer: Self-pay | Admitting: Family Medicine

## 2016-06-10 ENCOUNTER — Ambulatory Visit (INDEPENDENT_AMBULATORY_CARE_PROVIDER_SITE_OTHER): Payer: Medicare Other | Admitting: Family Medicine

## 2016-06-10 DIAGNOSIS — E785 Hyperlipidemia, unspecified: Secondary | ICD-10-CM | POA: Insufficient documentation

## 2016-06-10 DIAGNOSIS — I2510 Atherosclerotic heart disease of native coronary artery without angina pectoris: Secondary | ICD-10-CM

## 2016-06-10 DIAGNOSIS — I10 Essential (primary) hypertension: Secondary | ICD-10-CM | POA: Diagnosis not present

## 2016-06-10 MED ORDER — ATORVASTATIN CALCIUM 10 MG PO TABS: 10.0000 mg | ORAL_TABLET | Freq: Every day | ORAL | 3 refills | Status: DC

## 2016-06-10 NOTE — Patient Instructions (Addendum)
Start atorvastatin 10mg  for cholesterol  Best of luck with surgery  No other changes

## 2016-06-10 NOTE — Assessment & Plan Note (Signed)
S: asymptomatic. Minimal nonobstructive CAD. Will see Dr. Martinique in about 6 months A/P: cannot tolerate aspirin due to abdominal pain even while on PPI. Not on statin- will start- see HLD. BP controlled. He is able to climb steps without shortness of breath or chest pain. Has upcoming left inguinal hernia repair and removal kidney stone in bladder that is growing- I do not see medical reason to keep him from surgery. Cardiac wise I think reasonable to proceed as well but we could get Dr. Doug Sou opinion too if requested.

## 2016-06-10 NOTE — Assessment & Plan Note (Signed)
S: controlled on losartan 50mg  BID , metoprolol 25mg  BID BP Readings from Last 3 Encounters:  06/10/16 128/78  03/20/16 108/68  10/25/15 122/68  A/P:Continue current meds:  Doing well. Cautions with overtreating due to history orthostatic hypotension (also has to watch it when takes flomax prn)

## 2016-06-10 NOTE — Progress Notes (Signed)
Subjective:  Joseph Simpson is a 81 y.o. year old very pleasant male patient who presents for/with See problem oriented charting ROS- No chest pain or shortness of breath. No headache or blurry vision. Asthma has not been acting up recently.see any ROS included in HPI as well.   Past Medical History-  Patient Active Problem List   Diagnosis Date Noted  . CAD (coronary artery disease)     Priority: High  . BPH (benign prostatic hyperplasia) 11/23/2014    Priority: Medium  . Palpitations 07/31/2014    Priority: Medium  . Pulmonary nodule 05/10/2014    Priority: Medium  . Dyspnea 02/27/2014    Priority: Medium  . MELANOMA, ARM 09/26/2007    Priority: Medium  . Essential hypertension 09/23/2007    Priority: Medium  . 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 Dyslipidemia     Priority: Low  . Murmur 02/10/2012    Priority: Low  . Macular degeneration 11/17/2011    Priority: Low  . Nephrolithiasis 09/04/2011    Priority: Low  . GERD 09/23/2007    Priority: Low  . Hyperlipidemia 06/10/2016  . PAC (premature atrial contraction) 10/25/2015  . Cough 05/10/2014    Medications- reviewed and updated Current Outpatient Prescriptions  Medication Sig Dispense Refill  . acetaminophen (TYLENOL) 500 MG tablet Take 1,000 mg by mouth every 6 (six) hours as needed. PAIN      . albuterol (PROAIR HFA) 108 (90 BASE) MCG/ACT inhaler Inhale 2 puffs into the lungs every 6 (six) hours as needed for wheezing or shortness of breath. 1 Inhaler 3  . bevacizumab (AVASTIN) 2.5 mg/0.1 mL SOLN 2.5 mg. Injection to eye every 8 weeks    . diclofenac sodium (VOLTAREN) 1 % GEL Apply 2 g topically 4 (four) times daily. Cannot take oral nsaids due to partial gastrectomy. For infrapatellar bursitis. 100 g 0  . losartan (COZAAR) 100 MG tablet Take 0.5 tablets (50 mg total) by mouth 2 (two) times daily. 90 tablet 3  . metoCLOPramide (REGLAN) 5 MG tablet Take 5 mg by mouth 2 (two) times daily.     . metoprolol tartrate (LOPRESSOR) 25 MG tablet Take 1 tablet (25 mg total) by mouth 2 (two) times daily. 180 tablet 3  . pantoprazole (PROTONIX) 40 MG tablet Take 40 mg by mouth daily.     [name] Glycol-Propyl Glycol (SYSTANE ULTRA) 0.4-0.3 % SOLN Apply 1 drop to eye 3 (three) times daily.      . tamsulosin (FLOMAX) 0.4 MG CAPS capsule Take 0.4 mg by mouth as needed.     Marland Kitchen VIGAMOX 0.5 % ophthalmic solution Take every 8 weeks as directed     No current facility-administered medications for this visit.     Objective: BP 128/78 (BP Location: Left Arm, Patient Position: Sitting, Cuff Size: Normal)   Pulse 67   Temp 98.2 F (36.8 C) (Oral)   Wt 185 lb 6.4 oz (84.1 kg)   SpO2 94%   BMI 24.46 kg/m  Gen: NAD, resting comfortably CV: RRR no murmurs rubs or gallops Lungs: CTAB no crackles, wheeze, rhonchi Ext: no edema Skin: warm, dry, no rash Neuro: grossly normal, moves all extremities  Did not do GU exam  Assessment/Plan:  Essential hypertension S: controlled on losartan 50mg  BID , metoprolol 25mg  BID BP Readings from Last 3 Encounters:  06/10/16 128/78  03/20/16 108/68  10/25/15 122/68  A/P:Continue current meds:  Doing well. Cautions with overtreating due to history  orthostatic hypotension (also has to watch it when takes flomax prn)  CAD (coronary artery disease) S: asymptomatic. Minimal nonobstructive CAD. Will see Dr. Martinique in about 6 months A/P: cannot tolerate aspirin due to abdominal pain even while on PPI. Not on statin- will start- see HLD. BP controlled. He is able to climb steps without shortness of breath or chest pain. Has upcoming left inguinal hernia repair and removal kidney stone in bladder that is growing- I do not see medical reason to keep him from surgery. Cardiac wise I think reasonable to proceed as well but we could get Dr. Doug Sou opinion too if requested.   Hyperlipidemia S: mild poorly controlled on no statin. No myalgias.  Lab Results   Component Value Date   CHOL 167 05/27/2015   HDL 31.20 (L) 05/27/2015   LDLCALC 109 (H) 05/27/2015   TRIG 134.0 05/27/2015   CHOLHDL 5 05/27/2015   A/P: we will start statin atorvastatin 10mg  to reduce risks if patient can tolerate given upcoming surgery. CAD is minimal and nonobstructive but this should further lower risks. He will reach out if myalgias- we will plan on taking every other day or weekly if not doing well with this.     Return in about 1 year (around 06/10/2017) for ., follow up- or sooner if needed. see Dr. Martinique in between.  Meds ordered this encounter  Medications  . atorvastatin (LIPITOR) 10 MG tablet    Sig: Take 1 tablet (10 mg total) by mouth daily.    Dispense:  90 tablet    Refill:  3    Return precautions advised.  Garret Reddish, MD

## 2016-06-10 NOTE — Assessment & Plan Note (Signed)
S: mild poorly controlled on no statin. No myalgias.  Lab Results  Component Value Date   CHOL 167 05/27/2015   HDL 31.20 (L) 05/27/2015   LDLCALC 109 (H) 05/27/2015   TRIG 134.0 05/27/2015   CHOLHDL 5 05/27/2015   A/P: we will start statin atorvastatin 10mg  to reduce risks if patient can tolerate given upcoming surgery. CAD is minimal and nonobstructive but this should further lower risks. He will reach out if myalgias- we will plan on taking every other day or weekly if not doing well with this.

## 2016-06-10 NOTE — Progress Notes (Signed)
Pre visit review using our clinic review tool, if applicable. No additional management support is needed unless otherwise documented below in the visit note. 

## 2016-06-12 ENCOUNTER — Other Ambulatory Visit: Payer: Self-pay | Admitting: Urology

## 2016-06-15 ENCOUNTER — Other Ambulatory Visit: Payer: Self-pay | Admitting: Urology

## 2016-07-01 ENCOUNTER — Encounter (HOSPITAL_COMMUNITY): Payer: Self-pay

## 2016-07-01 NOTE — Patient Instructions (Addendum)
Joseph Simpson  07/01/2016   Your procedure is scheduled on: 07/13/16  Report to [facility] Main  Entrance take [place]  elevators to 3rd floor to  [place] at  [place] AM.  Call this number if you have problems the morning of surgery [phone]   Remember: ONLY 1 PERSON MAY GO WITH YOU TO SHORT STAY TO GET  READY MORNING OF [place].  Do not eat food or drink liquids :After Midnight.     Take these medicines the morning of surgery with A SIP OF WATER: METOPROLOL; may use Albuterol Inhaler if needed; May use eye drops if needed; Pantoprazole (Protonix)                               You may not have any metal on your body including hair pins and              piercings  Do not wear jewelry, lotions, powders or colognes, deodorant             Men may shave face and neck.   Do not bring valuables to the hospital. [place].  Contacts, dentures or bridgework may not be worn into surgery.      Patients discharged the day of surgery will not be allowed to drive home.  Name and phone number of your driver:pt states will have a family member here at discharge not sure of which family member at present time   Special Instructions: NO SMOKING 24 HOURS PRIOR TO [place]              _____________________________________________________________________             [facility] - Preparing for Surgery Before surgery, you can play an important role.  Because skin is not sterile, your skin needs to be as free of germs as possible.  You can reduce the number of germs on your skin by washing with CHG (chlorahexidine gluconate) soap before surgery.  CHG is an antiseptic cleaner which kills germs and bonds with the skin to continue killing germs even after washing. Please DO NOT use if you have an allergy to CHG or antibacterial soaps.  If your skin becomes reddened/irritated stop using the CHG and inform  your nurse when you arrive at Short Stay. Do not shave (including legs and underarms) for at least 48 hours prior to the first CHG shower.  You may shave your face/neck. Please follow these instructions carefully:  1.  Shower with CHG Soap the night before surgery and the  morning of Surgery.  2.  If you choose to wash your hair, wash your hair first as usual with your  normal  shampoo.  3.  After you shampoo, rinse your hair and body thoroughly to remove the  shampoo.                           4.  Use CHG as you would any other liquid soap.  You can apply chg directly  to the skin and wash                       Gently  with a scrungie or clean washcloth.  5.  Apply the CHG Soap to your body ONLY FROM THE NECK DOWN.   Do not use on face/ open                           Wound or open sores. Avoid contact with eyes, ears mouth and genitals (private parts).                       Wash face,  Genitals (private parts) with your normal soap.             6.  Wash thoroughly, paying special attention to the area where your surgery  will be performed.  7.  Thoroughly rinse your body with warm water from the neck down.  8.  DO NOT shower/wash with your normal soap after using and rinsing off  the CHG Soap.                9.  Pat yourself dry with a clean towel.            10.  Wear clean pajamas.            11.  Place clean sheets on your bed the night of your first shower and do not  sleep with pets. Day of Surgery : Do not apply any lotions/deodorants the morning of surgery.  Please wear clean clothes to the hospital/surgery center.  FAILURE TO FOLLOW THESE INSTRUCTIONS MAY RESULT IN THE CANCELLATION OF YOUR SURGERY PATIENT SIGNATURE_________________________________  NURSE SIGNATURE__________________________________  ________________________________________________________________________

## 2016-07-01 NOTE — Progress Notes (Signed)
lov with ekg 12/16 Dr Martinique, [place] dr sood [phone]

## 2016-07-02 DIAGNOSIS — Z85828 Personal history of other malignant neoplasm of skin: Secondary | ICD-10-CM | POA: Diagnosis not present

## 2016-07-02 DIAGNOSIS — L570 Actinic keratosis: Secondary | ICD-10-CM | POA: Diagnosis not present

## 2016-07-03 ENCOUNTER — Encounter (HOSPITAL_COMMUNITY): Payer: Self-pay

## 2016-07-03 ENCOUNTER — Encounter (HOSPITAL_COMMUNITY)
Admission: RE | Admit: 2016-07-03 | Discharge: 2016-07-03 | Disposition: A | Discharge status: Home / Self Care | Payer: Medicare Other | Source: Ambulatory Visit | Attending: Surgery | Admitting: Surgery

## 2016-07-03 DIAGNOSIS — Z01812 Encounter for preprocedural laboratory examination: Secondary | ICD-10-CM | POA: Insufficient documentation

## 2016-07-03 DIAGNOSIS — K4090 Unilateral inguinal hernia, without obstruction or gangrene, not specified as recurrent: Secondary | ICD-10-CM | POA: Insufficient documentation

## 2016-07-03 HISTORY — DX: Personal history of other diseases of the digestive system: Z87.19

## 2016-07-03 HISTORY — DX: Unspecified osteoarthritis, unspecified site: M19.90

## 2016-07-03 HISTORY — DX: Personal history of urinary calculi: Z87.442

## 2016-07-03 HISTORY — DX: Cardiac arrhythmia, unspecified: I49.9

## 2016-07-03 LAB — BASIC METABOLIC PANEL
Anion gap: 6 (ref 5–15)
BUN: 13 mg/dL (ref 6–20)
CO2: 31 mmol/L (ref 22–32)
Calcium: 9.9 mg/dL (ref 8.9–10.3)
Chloride: 101 mmol/L (ref 101–111)
Creatinine, Ser: 0.79 mg/dL (ref 0.61–1.24)
GFR calc Af Amer: >60 mL/min (ref >60)
GFR calc non Af Amer: >60 mL/min (ref >60)
Glucose, Bld: 105 mg/dL — ABNORMAL HIGH (ref 65–99)
Potassium: 4.3 mmol/L (ref 3.5–5.1)
Sodium: 138 mmol/L (ref 135–145)

## 2016-07-03 LAB — CBC
HCT: 48.7 % (ref 39.0–52.0)
Hemoglobin: 16.4 g/dL (ref 13.0–17.0)
MCH: 32.2 pg (ref 26.0–34.0)
MCHC: 33.7 g/dL (ref 30.0–36.0)
MCV: 95.5 fL (ref 78.0–100.0)
Platelets: 150 10*3/uL (ref 150–400)
RBC: 5.1 MIL/uL (ref 4.22–5.81)
RDW: 12.8 % (ref 11.5–15.5)
WBC: 6.4 10*3/uL (ref 4.0–10.5)

## 2016-07-12 ENCOUNTER — Encounter (HOSPITAL_COMMUNITY): Payer: Self-pay | Admitting: Surgery

## 2016-07-12 DIAGNOSIS — K4090 Unilateral inguinal hernia, without obstruction or gangrene, not specified as recurrent: Secondary | ICD-10-CM

## 2016-07-12 NOTE — Anesthesia Preprocedure Evaluation (Addendum)
Anesthesia Evaluation  Patient identified by MRN, date of birth, ID band Patient awake    Reviewed: Allergy & Precautions, H&P , Patient's Chart, lab work & pertinent test results, reviewed documented beta blocker date and time   Airway Mallampati: II  TM Distance: >3 FB Neck ROM: full    Dental no notable dental hx.    Pulmonary former smoker,    Pulmonary exam normal breath sounds clear to auscultation       Cardiovascular hypertension, + dysrhythmias Supra Ventricular Tachycardia  Rhythm:regular Rate:Normal     Neuro/Psych    GI/Hepatic   Endo/Other    Renal/GU      Musculoskeletal   Abdominal   Peds  Hematology   Anesthesia Other Findings   Reproductive/Obstetrics                            Anesthesia Physical Anesthesia Plan  ASA: II  Anesthesia Plan: General   Post-op Pain Management:    Induction: Intravenous  Airway Management Planned: Oral ETT  Additional Equipment:   Intra-op Plan:   Post-operative Plan: Extubation in OR  Informed Consent: I have reviewed the patients History and Physical, chart, labs and discussed the procedure including the risks, benefits and alternatives for the proposed anesthesia with the patient or authorized representative who has indicated his/her understanding and acceptance.   Dental Advisory Given and Dental advisory given  Plan Discussed with: CRNA and Surgeon  Anesthesia Plan Comments: (  Discussed general anesthesia, including possible nausea, instrumentation of airway, sore throat,pulmonary aspiration, etc. I asked if the were any outstanding questions, or  concerns before we proceeded. )        Anesthesia Quick Evaluation

## 2016-07-12 NOTE — H&P (Signed)
General Surgery [facility]  Joseph Simpson DOB: 1935-07-05 Single / Language: Joseph Simpson / Race: White Male  History of Present Illness  The patient is a 81 year old male who presents with an inguinal hernia.  Patient is referred by Dr. Franchot Gallo for evaluation of left inguinal hernia. Patient's primary care physician is Dr. Rushie Chestnut. Patient was evaluated by Joseph Simpson at [facility] urology. Patient first noted a left inguinal hernia approximately 1 year ago. This is gradually increased in size. It is causing him some discomfort which she describes as burning. This is intermittent. Patient denies any signs or symptoms of intestinal obstruction. He has had no prior hernia repairs. Patient does have a bladder stone which he has discussed with Dr. Diona Simpson. He would like to consider concurrent removal of the bladder stone at the time we perform his inguinal hernia surgery.  Other Problems  Arthritis Gastric Ulcer Gastroesophageal Reflux Disease High blood pressure Inguinal Hernia Kidney Stone Melanoma  Past Surgical History Cataract Surgery Left.  Diagnostic Studies History Colonoscopy 1-5 years ago  Allergies Aspirin *ANALGESICS - NonNarcotic* OxyCODONE HCl (Abuse Deter) *ANALGESICS - OPIOID*  Medication History Losartan Potassium (100MG  Tablet, Oral) Active. Metoprolol Tartrate (25MG  Tablet, Oral) Active. Pantoprazole Sodium (40MG  Tablet DR, Oral) Active. Vigamox (0.5% Solution, Ophthalmic) Active. Tamsulosin HCl (0.4MG  Capsule, Oral) Active. Metoclopramide HCl (10MG  Tablet, Oral) Active. Medications Reconciled  Social History Alcohol use Occasional alcohol use. No caffeine use No drug use Tobacco use Current some day smoker.  Family History Breast Cancer Sister. Respiratory Condition Brother, Sister.  Review of Systems General Not Present- Appetite Loss, Chills, Fatigue, Fever, Night Sweats,  Weight Gain and Weight Loss. Skin Present- New Lesions. Not Present- Change in Wart/Mole, Dryness, Hives, Jaundice, Non-Healing Wounds, Rash and Ulcer. HEENT Present- Ringing in the Ears and Wears glasses/contact lenses. Not Present- Earache, Hearing Loss, Hoarseness, Nose Bleed, Oral Ulcers, Seasonal Allergies, Sinus Pain, Sore Throat, Visual Disturbances and Yellow Eyes. Respiratory Present- Snoring. Not Present- Bloody sputum, Chronic Cough, Difficulty Breathing and Wheezing. Breast Not Present- Breast Mass, Breast Pain, Nipple Discharge and Skin Changes. Cardiovascular Not Present- Chest Pain, Difficulty Breathing Lying Down, Leg Cramps, Palpitations, Rapid Heart Rate, Shortness of Breath and Swelling of Extremities. Gastrointestinal Not Present- Abdominal Pain, Bloating, Bloody Stool, Change in Bowel Habits, Chronic diarrhea, Constipation, Difficulty Swallowing, Excessive gas, Gets full quickly at meals, Hemorrhoids, Indigestion, Nausea, Rectal Pain and Vomiting. Male Genitourinary Not Present- Blood in Urine, Change in Urinary Stream, Frequency, Impotence, Nocturia, Painful Urination, Urgency and Urine Leakage. Musculoskeletal Not Present- Back Pain, Joint Pain, Joint Stiffness, Muscle Pain, Muscle Weakness and Swelling of Extremities. Neurological Not Present- Decreased Memory, Fainting, Headaches, Numbness, Seizures, Tingling, Tremor, Trouble walking and Weakness. Psychiatric Not Present- Anxiety, Bipolar, Change in Sleep Pattern, Depression, Fearful and Frequent crying. Endocrine Not Present- Cold Intolerance, Excessive Hunger, Hair Changes, Heat Intolerance, Hot flashes and New Diabetes. Hematology Not Present- Blood Thinners, Easy Bruising, Excessive bleeding, Gland problems, HIV and Persistent Infections.  Vitals Weight: 182 lb Height: 73in Body Surface Area: 2.07 m Body Mass Index: 24.01 kg/m  Temp.: 69F(Temporal)  Pulse: 79 (Regular)  BP: 124/74 (Sitting, Left Arm,  Standard)  Physical Exam  The physical exam findings are as follows: Note:General - appears comfortable, no distress; not diaphorectic  HEENT - normocephalic; sclerae clear, gaze conjugate; mucous membranes moist, dentition good; voice normal  Neck - symmetric on extension; no palpable anterior or posterior cervical adenopathy; no palpable masses in the thyroid bed  Chest -  clear bilaterally without rhonchi, rales, or wheeze  Cor - regular rhythm with normal rate; no significant murmur  Abd - soft without distension  GU - normal male genitalia; outpatient in the right inguinal canal with cough and Valsalva shows no sign of hernia; on the left side there is an obvious bulge when in a standing position; palpation reveals a bulge which augments with coughing Valsalva consistent with left inguinal hernia; hernia is reducible with minimal tenderness  Ext - non-tender without significant edema or lymphedema  Neuro - grossly intact; no tremor   Assessment & Plan  LEFT INGUINAL HERNIA (K40.90)  Patient presents on referral from his urologist for evaluation of an enlarging left inguinal hernia which has become mildly symptomatic. Patient has had no signs or symptoms of obstruction. He wishes to proceed with surgical repair. He would like to coordinate this with removal of a bladder stone by his urologist.  Patient discussed left inguinal hernia repair with mesh. I provided him with written literature on hernia surgery to review at home. We discussed the risk and benefits of the procedure including the potential for recurrence. We discussed restrictions on his activities following the procedure. He understands and wishes to proceed.  The risks and benefits of the procedure have been discussed at length with the patient. The patient understands the proposed procedure, potential alternative treatments, and the course of recovery to be expected. All of the patient's questions have been answered  at this time. The patient wishes to proceed with surgery.  Earnstine Regal, MD, [facility] Office: [phone]

## 2016-07-13 ENCOUNTER — Ambulatory Visit (HOSPITAL_COMMUNITY): Payer: Medicare Other | Admitting: Anesthesiology

## 2016-07-13 ENCOUNTER — Ambulatory Visit (HOSPITAL_COMMUNITY)
Admission: RE | Admit: 2016-07-13 | Discharge: 2016-07-13 | Disposition: A | Discharge status: Home / Self Care | Payer: Medicare Other | Source: Ambulatory Visit | Attending: Surgery | Admitting: Surgery

## 2016-07-13 ENCOUNTER — Encounter (HOSPITAL_COMMUNITY)
Admission: RE | Disposition: A | Discharge status: Home / Self Care | Payer: Self-pay | Source: Ambulatory Visit | Attending: Surgery

## 2016-07-13 ENCOUNTER — Encounter (HOSPITAL_COMMUNITY): Payer: Self-pay | Admitting: *Deleted

## 2016-07-13 DIAGNOSIS — E785 Hyperlipidemia, unspecified: Secondary | ICD-10-CM | POA: Diagnosis not present

## 2016-07-13 DIAGNOSIS — K219 Gastro-esophageal reflux disease without esophagitis: Secondary | ICD-10-CM | POA: Insufficient documentation

## 2016-07-13 DIAGNOSIS — M1990 Unspecified osteoarthritis, unspecified site: Secondary | ICD-10-CM | POA: Diagnosis not present

## 2016-07-13 DIAGNOSIS — G4733 Obstructive sleep apnea (adult) (pediatric): Secondary | ICD-10-CM | POA: Diagnosis not present

## 2016-07-13 DIAGNOSIS — N210 Calculus in bladder: Secondary | ICD-10-CM | POA: Diagnosis not present

## 2016-07-13 DIAGNOSIS — K4090 Unilateral inguinal hernia, without obstruction or gangrene, not specified as recurrent: Secondary | ICD-10-CM | POA: Diagnosis not present

## 2016-07-13 DIAGNOSIS — I10 Essential (primary) hypertension: Secondary | ICD-10-CM | POA: Diagnosis not present

## 2016-07-13 DIAGNOSIS — N400 Enlarged prostate without lower urinary tract symptoms: Secondary | ICD-10-CM | POA: Insufficient documentation

## 2016-07-13 DIAGNOSIS — Z87891 Personal history of nicotine dependence: Secondary | ICD-10-CM | POA: Diagnosis not present

## 2016-07-13 DIAGNOSIS — I2510 Atherosclerotic heart disease of native coronary artery without angina pectoris: Secondary | ICD-10-CM | POA: Insufficient documentation

## 2016-07-13 DIAGNOSIS — I471 Supraventricular tachycardia: Secondary | ICD-10-CM | POA: Insufficient documentation

## 2016-07-13 DIAGNOSIS — Z79899 Other long term (current) drug therapy: Secondary | ICD-10-CM | POA: Diagnosis not present

## 2016-07-13 HISTORY — PX: HERNIA REPAIR: SHX51

## 2016-07-13 HISTORY — PX: INGUINAL HERNIA REPAIR: SHX194

## 2016-07-13 HISTORY — PX: CYSTOSCOPY WITH LITHOLAPAXY: SHX1425

## 2016-07-13 HISTORY — PX: INSERTION OF MESH: SHX5868

## 2016-07-13 SURGERY — REPAIR, HERNIA, INGUINAL, ADULT
Anesthesia: General

## 2016-07-13 MED ORDER — LIDOCAINE HCL (CARDIAC) 20 MG/ML IV SOLN
INTRAVENOUS | Status: AC
  Filled 2016-07-13: qty 5

## 2016-07-13 MED ORDER — DEXAMETHASONE SODIUM PHOSPHATE 10 MG/ML IJ SOLN
INTRAMUSCULAR | Status: AC
  Filled 2016-07-13: qty 1

## 2016-07-13 MED ORDER — GLYCOPYRROLATE 0.2 MG/ML IJ SOLN
INTRAMUSCULAR | Status: AC
Start: 2016-07-13 — End: 2016-07-13
  Filled 2016-07-13: qty 2

## 2016-07-13 MED ORDER — LIDOCAINE HCL (CARDIAC) 20 MG/ML IV SOLN
INTRAVENOUS | Status: AC
  Filled 2016-07-13: qty 10

## 2016-07-13 MED ORDER — SUGAMMADEX SODIUM 200 MG/2ML IV SOLN
INTRAVENOUS | Status: DC | PRN
  Administered 2016-07-13: 200 mg via INTRAVENOUS

## 2016-07-13 MED ORDER — BUPIVACAINE HCL (PF) 0.5 % IJ SOLN
INTRAMUSCULAR | Status: DC | PRN
  Administered 2016-07-13: 20 mL

## 2016-07-13 MED ORDER — FENTANYL CITRATE (PF) 100 MCG/2ML IJ SOLN
INTRAMUSCULAR | Status: DC | PRN
  Administered 2016-07-13 (×2): 50 ug via INTRAVENOUS
  Administered 2016-07-13: 100 ug via INTRAVENOUS

## 2016-07-13 MED ORDER — MEPERIDINE HCL 50 MG/ML IJ SOLN: 6.2500 mg | INTRAMUSCULAR | Status: DC | PRN

## 2016-07-13 MED ORDER — ONDANSETRON HCL 4 MG/2ML IJ SOLN
INTRAMUSCULAR | Status: DC | PRN
  Administered 2016-07-13: 4 mg via INTRAVENOUS

## 2016-07-13 MED ORDER — SUGAMMADEX SODIUM 200 MG/2ML IV SOLN
INTRAVENOUS | Status: AC
  Filled 2016-07-13: qty 2

## 2016-07-13 MED ORDER — SODIUM CHLORIDE 0.9 % IR SOLN
Status: DC | PRN
  Administered 2016-07-13: 1000 mL

## 2016-07-13 MED ORDER — CHLORHEXIDINE GLUCONATE CLOTH 2 % EX PADS: 6.0000 | MEDICATED_PAD | Freq: Once | CUTANEOUS | Status: DC

## 2016-07-13 MED ORDER — LACTATED RINGERS IV SOLN
INTRAVENOUS | Status: DC | PRN
  Administered 2016-07-13 (×2): via INTRAVENOUS

## 2016-07-13 MED ORDER — BUPIVACAINE HCL (PF) 0.5 % IJ SOLN
INTRAMUSCULAR | Status: AC
  Filled 2016-07-13: qty 30

## 2016-07-13 MED ORDER — CEFAZOLIN SODIUM-DEXTROSE 2-4 GM/100ML-% IV SOLN
2.0000 g | INTRAVENOUS | Status: AC
  Administered 2016-07-13: 2 g via INTRAVENOUS

## 2016-07-13 MED ORDER — HYDROCODONE-ACETAMINOPHEN 5-325 MG PO TABS: 1.0000 | ORAL_TABLET | ORAL | 0 refills | Status: DC | PRN

## 2016-07-13 MED ORDER — ROCURONIUM BROMIDE 100 MG/10ML IV SOLN
INTRAVENOUS | Status: AC
  Filled 2016-07-13: qty 1

## 2016-07-13 MED ORDER — ONDANSETRON HCL 4 MG/2ML IJ SOLN: 4.0000 mg | Freq: Once | INTRAMUSCULAR | Status: DC | PRN

## 2016-07-13 MED ORDER — DEXAMETHASONE SODIUM PHOSPHATE 10 MG/ML IJ SOLN
INTRAMUSCULAR | Status: DC | PRN
  Administered 2016-07-13: 10 mg via INTRAVENOUS

## 2016-07-13 MED ORDER — ROCURONIUM BROMIDE 100 MG/10ML IV SOLN
INTRAVENOUS | Status: DC | PRN
  Administered 2016-07-13: 35 mg via INTRAVENOUS

## 2016-07-13 MED ORDER — STERILE WATER FOR IRRIGATION IR SOLN
Status: DC | PRN
  Administered 2016-07-13: 3000 mL

## 2016-07-13 MED ORDER — SODIUM CHLORIDE 0.9 % IJ SOLN
INTRAMUSCULAR | Status: AC
  Filled 2016-07-13: qty 10

## 2016-07-13 MED ORDER — EPHEDRINE SULFATE 50 MG/ML IJ SOLN
INTRAMUSCULAR | Status: DC | PRN
  Administered 2016-07-13 (×6): 5 mg via INTRAVENOUS

## 2016-07-13 MED ORDER — ONDANSETRON HCL 4 MG/2ML IJ SOLN
INTRAMUSCULAR | Status: AC
  Filled 2016-07-13: qty 2

## 2016-07-13 MED ORDER — GLYCOPYRROLATE 0.2 MG/ML IV SOSY
PREFILLED_SYRINGE | INTRAVENOUS | Status: DC | PRN
  Administered 2016-07-13: .2 mg via INTRAVENOUS

## 2016-07-13 MED ORDER — PROPOFOL 10 MG/ML IV BOLUS
INTRAVENOUS | Status: AC
Start: 2016-07-13 — End: 2016-07-13
  Filled 2016-07-13: qty 40

## 2016-07-13 MED ORDER — LIDOCAINE HCL (CARDIAC) 20 MG/ML IV SOLN
INTRAVENOUS | Status: DC | PRN
  Administered 2016-07-13: 50 mg via INTRAVENOUS

## 2016-07-13 MED ORDER — FENTANYL CITRATE (PF) 100 MCG/2ML IJ SOLN: 25.0000 ug | INTRAMUSCULAR | Status: DC | PRN

## 2016-07-13 MED ORDER — EPHEDRINE SULFATE 50 MG/ML IJ SOLN
INTRAMUSCULAR | Status: AC
  Filled 2016-07-13: qty 2

## 2016-07-13 MED ORDER — FENTANYL CITRATE (PF) 100 MCG/2ML IJ SOLN
INTRAMUSCULAR | Status: AC
  Filled 2016-07-13: qty 4

## 2016-07-13 MED ORDER — PROPOFOL 10 MG/ML IV BOLUS
INTRAVENOUS | Status: DC | PRN
  Administered 2016-07-13: 140 mg via INTRAVENOUS

## 2016-07-13 MED ORDER — CEPHALEXIN 500 MG PO CAPS: 500.0000 mg | ORAL_CAPSULE | Freq: Two times a day (BID) | ORAL | 0 refills | Status: DC

## 2016-07-13 MED ORDER — CEFAZOLIN SODIUM-DEXTROSE 2-4 GM/100ML-% IV SOLN
INTRAVENOUS | Status: AC
  Filled 2016-07-13: qty 100

## 2016-07-13 MED ORDER — SODIUM CHLORIDE 0.9 % IJ SOLN
INTRAMUSCULAR | Status: AC
  Filled 2016-07-13: qty 20

## 2016-07-13 MED ORDER — EPHEDRINE SULFATE 50 MG/ML IJ SOLN
INTRAMUSCULAR | Status: AC
Start: 2016-07-13 — End: 2016-07-13
  Filled 2016-07-13: qty 1

## 2016-07-13 SURGICAL SUPPLY — 50 items
APL SKNCLS STERI-STRIP NONHPOA (GAUZE/BANDAGES/DRESSINGS) ×2
APPLICATOR COTTON TIP 6IN STRL (MISCELLANEOUS) ×2 IMPLANT
BAG URO CATCHER STRL LF (MISCELLANEOUS) ×3 IMPLANT
BENZOIN TINCTURE PRP APPL 2/3 (GAUZE/BANDAGES/DRESSINGS) ×3 IMPLANT
BLADE SURG 15 STRL LF DISP TIS (BLADE) ×2 IMPLANT
BLADE SURG 15 STRL SS (BLADE) ×3
BLADE SURG SZ10 CARB STEEL (BLADE) IMPLANT
CARTRIDGE STONEBREAK CO2 KIDNE (ELECTROSURGICAL) ×1 IMPLANT
CHLORAPREP W/TINT 26ML (MISCELLANEOUS) ×5 IMPLANT
CLOTH BEACON ORANGE TIMEOUT ST (SAFETY) ×3 IMPLANT
COVER SURGICAL LIGHT HANDLE (MISCELLANEOUS) ×2 IMPLANT
DECANTER SPIKE VIAL GLASS SM (MISCELLANEOUS) ×2 IMPLANT
DRAIN PENROSE 18X1/2 LTX STRL (DRAIN) ×3 IMPLANT
DRAPE LAPAROTOMY TRNSV 102X78 (DRAPE) ×3 IMPLANT
ELECT PENCIL ROCKER SW 15FT (MISCELLANEOUS) ×3 IMPLANT
ELECT REM PT RETURN 9FT ADLT (ELECTROSURGICAL) ×3
ELECTRODE REM PT RTRN 9FT ADLT (ELECTROSURGICAL) ×2 IMPLANT
EVACUATOR MICROVAS BLADDER (UROLOGICAL SUPPLIES) ×1 IMPLANT
FIBER LASER FLEXIVA 1000 (UROLOGICAL SUPPLIES) IMPLANT
FIBER LASER FLEXIVA 550 (UROLOGICAL SUPPLIES) IMPLANT
GAUZE SPONGE 4X4 12PLY STRL (GAUZE/BANDAGES/DRESSINGS) ×3 IMPLANT
GLOVE BIOGEL M 8.0 STRL (GLOVE) ×3 IMPLANT
GLOVE BIOGEL PI IND STRL 7.0 (GLOVE) ×2 IMPLANT
GLOVE BIOGEL PI INDICATOR 7.0 (GLOVE) ×1
GLOVE SURG ORTHO 8.0 STRL STRW (GLOVE) ×3 IMPLANT
GOWN STRL REUS W/ TWL XL LVL3 (GOWN DISPOSABLE) ×2 IMPLANT
GOWN STRL REUS W/TWL LRG LVL3 (GOWN DISPOSABLE) ×3 IMPLANT
GOWN STRL REUS W/TWL XL LVL3 (GOWN DISPOSABLE) ×9 IMPLANT
KIT BASIN OR (CUSTOM PROCEDURE TRAY) ×3 IMPLANT
MANIFOLD NEPTUNE II (INSTRUMENTS) ×3 IMPLANT
MESH ULTRAPRO 3X6 7.6X15CM (Mesh General) ×1 IMPLANT
NDL HYPO 25X1 1.5 SAFETY (NEEDLE) ×2 IMPLANT
NEEDLE HYPO 25X1 1.5 SAFETY (NEEDLE) ×3 IMPLANT
NS IRRIG 1000ML POUR BTL (IV SOLUTION) ×3 IMPLANT
PACK BASIC VI WITH GOWN DISP (CUSTOM PROCEDURE TRAY) ×3 IMPLANT
PACK CYSTO (CUSTOM PROCEDURE TRAY) ×3 IMPLANT
PROBE PNEUMATIC 1.6MM (ELECTROSURGICAL) ×1 IMPLANT
SPONGE LAP 4X18 X RAY DECT (DISPOSABLE) ×8 IMPLANT
STRIP CLOSURE SKIN 1/2X4 (GAUZE/BANDAGES/DRESSINGS) ×3 IMPLANT
SUT MNCRL AB 4-0 PS2 18 (SUTURE) ×3 IMPLANT
SUT NOVA NAB GS-22 2 0 T19 (SUTURE) ×6 IMPLANT
SUT SILK 2 0 SH (SUTURE) ×3 IMPLANT
SUT VIC AB 3-0 SH 18 (SUTURE) ×3 IMPLANT
SYR BULB IRRIGATION 50ML (SYRINGE) ×3 IMPLANT
SYR CONTROL 10ML LL (SYRINGE) ×3 IMPLANT
SYRINGE IRR TOOMEY STRL 70CC (SYRINGE) IMPLANT
TAPE CLOTH SURG 4X10 WHT LF (GAUZE/BANDAGES/DRESSINGS) ×1 IMPLANT
TOWEL OR 17X26 10 PK STRL BLUE (TOWEL DISPOSABLE) ×3 IMPLANT
TUBING CONNECTING 10 (TUBING) ×1 IMPLANT
YANKAUER SUCT BULB TIP 10FT TU (MISCELLANEOUS) ×3 IMPLANT

## 2016-07-13 NOTE — Op Note (Signed)
Preoperative diagnosis: BPH with large bladder calculus, 17 millimeters  Postoperative diagnosis: Same, with multiple tiny stones granules  Principal procedure: Cystolitholapaxy large bladder calculus.  Surgeon: Diona Fanti  Anesthesia: Gen.  Specimen: Stone fragments, to the patient's family.  Complications: None  Drains: 18 French Foley catheter to leg bag.  Indications: 81 year old male with somatic left inguinal hernia and an enlarging bladder stone.  The patient does have BPH, symptoms have been adequately treated with Flomax.  The patient does have a large median lobe precluding passage of smaller calculi from the bladder through the urethra.  As a stone is enlarging, it was recommended that he undergo cystolitholapaxy, concurrently with and left inguinal hernia repair, which is to be performed by Dr. Armandina Gemma.  The patient is aware of risks and complications of procedure including however not limited to bleeding, postoperative urinary retention, infection, among others.  He desires to proceed.  Description of procedure: Following the patient's left inguinal hernia, the genitalia and perineum were prepped and draped.  Proper timeout was performed.  A 23 French panendoscope was advanced to the urethra.  This revealed no stricture or lesions.  Prostate revealed trilobar hypertrophy with an intravesical median lobe.  There are multiple tiny bladder stones, and there was a large jack stone which was the previously noted bladder stone.  No lesions were seen within the bladder.  Ureteral orifices were normal in their configuration and location.  There were 1-2+ trabeculations scattered throughout the bladder.  Following adequate bladder inspection, I used the [place] with a 1.6 millimeter probe to fragment the large jack stone into smaller fragments.  I then removed the cystoscope, and passed a 28 French resectoscope using the obturator.  I then used a Microvasive bladder evacuator to  remove all the small stones, as well as a fragments of the larger stone.  Following vigorous irrigation, the telescope was replaced.  No further stones were seen.  At this point, the stones were saved for specimen.  The scope was removed, and an 62 French Foley catheter was placed.  This was hooked to dependent drainage/leg bag.  This point, the procedure was terminated.  The patient was awakened and taken to the PACU in stable condition.  He tolerated the procedure well.

## 2016-07-13 NOTE — H&P (Signed)
Urology History and Physical Exam  CC: Bladder stone  HPI: 81 year old male presents for management of an inguinal hernia by Dr Harlow Asa as well as a 17 mm bladder calculus. He does have symptomatic BPH effectively managed by tamsulosin, but does have an enlarging Bladder stone.He presents now for cystolithalopaxy. PMH: Past Medical History:  Diagnosis Date  . Abnormal stress test December 2013   s/p cardiac cath with minimal nonobstructive CAD and normal LV function; medical management recommended  . Arthritis   . CAD (coronary artery disease)   . Dyslipidemia   . Dysrhythmia   . GERD (gastroesophageal reflux disease)   . History of hiatal hernia   . History of kidney stones   . Hypertension   . Macular degeneration   . Melanoma ([place])   . Obstructive sleep apnea 09/23/2007   Wore cpap previously. Stopped and not interested despite risks.     . OSA (obstructive sleep apnea)   . PAC (premature atrial contraction) 10/25/2015  . PVC's (premature ventricular contractions) 09/27/2013  . SVT (supraventricular tachycardia) ([place])   . Syncope     PSH: Past Surgical History:  Procedure Laterality Date  . APPENDECTOMY    . CARDIAC CATHETERIZATION  December 2013   minimal nonobstructive CAD with normal LV function  . CATARACT EXTRACTION    . CYSTOSCOPY/RETROGRADE/URETEROSCOPY/STONE EXTRACTION WITH BASKET  02/24/2012   Procedure: CYSTOSCOPY/RETROGRADE/URETEROSCOPY/STONE EXTRACTION WITH BASKET;  Surgeon: Franchot Gallo, MD;  Location: [facility];  Service: Urology;  Laterality: Bilateral;  1 HOUR  . GASTRECTOMY    . HEMORRHOID SURGERY    . MELANOMA EXCISION  05-05-2007   LEFT FOREARM  . NOSE SURGERY    . TONSILLECTOMY      Allergies: Allergies  Allergen Reactions  . Aspirin Other (See Comments)    UPSET STOMACH  . Percocet [Oxycodone-Acetaminophen] Nausea And Vomiting  . Vitamins [Apatate] Other (See Comments)    Bothers stomach     Medications: Prescriptions Prior to Admission  Medication Sig Dispense Refill Last Dose  . acetaminophen (TYLENOL) 500 MG tablet Take 1,000 mg by mouth every 6 (six) hours as needed. PAIN     07/12/2016 at Unknown time  . atorvastatin (LIPITOR) 10 MG tablet Take 1 tablet (10 mg total) by mouth daily. 90 tablet 3 07/12/2016 at Unknown time  . bevacizumab (AVASTIN) 2.5 mg/0.1 mL SOLN 2.5 mg. Injection to eye every 8 weeks - use with vigamox   Past Month at Unknown time  . losartan (COZAAR) 100 MG tablet Take 0.5 tablets (50 mg total) by mouth 2 (two) times daily. 90 tablet 3 07/12/2016 at Unknown time  . metoprolol tartrate (LOPRESSOR) 25 MG tablet Take 1 tablet (25 mg total) by mouth 2 (two) times daily. 180 tablet 3 07/13/2016 at 0315  . pantoprazole (PROTONIX) 40 MG tablet Take 40 mg by mouth 2 (two) times daily.    07/12/2016 at Unknown time  . Polyethyl Glycol-Propyl Glycol (SYSTANE ULTRA) 0.4-0.3 % SOLN Place 1 drop into both eyes 3 (three) times daily.    07/12/2016 at Unknown time  . tamsulosin (FLOMAX) 0.4 MG CAPS capsule Take 0.4 mg by mouth as needed.    Past Week at Unknown time  . VIGAMOX 0.5 % ophthalmic solution Take every 8 weeks as directed - use with avastin   07/12/2016 at Unknown time  . albuterol (PROAIR HFA) 108 (90 BASE) MCG/ACT inhaler Inhale 2 puffs into the lungs every 6 (six) hours as needed for wheezing or shortness of  breath. 1 Inhaler 3 More than a month at Unknown time  . metoCLOPramide (REGLAN) 5 MG tablet Take 5 mg by mouth 2 (two) times daily.   More than a month at Unknown time     Social History: Social History   Social History  . Marital status: Widowed    Spouse name: N/A  . Number of children: 1  . Years of education: N/A   Occupational History  . Not on file.   Social History Main Topics  . Smoking status: Former Smoker    Packs/day: 0.10    Years: 4.00    Types: Cigarettes    Quit date: 09/03/1956  . Smokeless tobacco: Never Used  . Alcohol  use No  . Drug use: No  . Sexual activity: Yes   Other Topics Concern  . 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       Hobbies: ride motorcycle (slingshot 3 year), rabbit and dear hunt    Family History: Family History  Problem Relation Age of Onset  . Heart disease Mother     CABG in her 63s  . Pneumonia Father   . Cancer Sister     breast  . Lung cancer Brother     smoker    Review of Systems: Genitourinary, constitutional, skin, eye, otolaryngeal, hematologic/lymphatic, cardiovascular, pulmonary, endocrine, musculoskeletal, gastrointestinal, neurological and psychiatric system(s) were reviewed and pertinent findings if present are noted and are otherwise negative.  Genitourinary: weak urinary stream, urinary stream starts and stops and erectile dysfunction, but no hematuria.  Gastrointestinal: groin bulge                   Physical Exam: Constitutional: Well nourished and well developed . No acute distress.  ENT:. The ears and nose are normal in appearance.  Neck: The appearance of the neck is normal.  Pulmonary: No respiratory distress and normal respiratory rhythm and effort.  Abdomen:  A left inguinal hernia is present, which is reducible.  Rectal: Rectal exam demonstrates normal sphincter tone, the anus is normal on inspection., no tenderness and no masses. Estimated prostate size is 3+. Normal rectal tone, no rectal masses, prostate is smooth, symmetric and non-tender. The prostate has no nodularity and is not tender. The left seminal vesicle is nonpalpable. The right seminal vesicle is nonpalpable. The perineum is normal on inspection.  Skin: Normal skin turgor, no visible rash and no visible skin lesions.  Neuro/Psych:. Mood and affect are appropriate.    Studies:  No results for input(s):  HGB, WBC, PLT in the last 72 hours.  No results for input(s): NA, K, CL, CO2, BUN, CREATININE, CALCIUM, GFRNONAA, GFRAA in the last 72 hours.  Invalid input(s): MAGNESIUM   No results for input(s): INR, APTT in the last 72 hours.  Invalid input(s): PT   Invalid input(s): ABG    Assessment:  17 mm bladder calculus Left inguinal hernia  Plan: Cystolithalopaxy

## 2016-07-13 NOTE — Anesthesia Procedure Notes (Signed)
Procedure Name: Intubation Date/Time: 07/13/2016 7:33 AM Performed by: Flavio Lindroth, Virgel Gess Pre-anesthesia Checklist: Patient identified, Emergency Drugs available, Suction available, Patient being monitored and Timeout performed Patient Re-evaluated:Patient Re-evaluated prior to inductionOxygen Delivery Method: Circle system utilized Preoxygenation: Pre-oxygenation with 100% oxygen Intubation Type: IV induction Ventilation: Mask ventilation without difficulty Laryngoscope Size: Mac and 4 Grade View: Grade I Tube type: Oral Tube size: 7.5 mm Number of attempts: 1 Airway Equipment and Method: Stylet Placement Confirmation: ETT inserted through vocal cords under direct vision,  positive ETCO2,  CO2 detector and breath sounds checked- equal and bilateral Secured at: 22 cm Tube secured with: Tape Dental Injury: Teeth and Oropharynx as per pre-operative assessment

## 2016-07-13 NOTE — Transfer of Care (Signed)
Immediate Anesthesia Transfer of Care Note  Patient: Joseph Simpson  Procedure(s) Performed: Procedure(s): REPAIR LEFT INGUINAL HERNIA (Left) INSERTION OF MESH (Left) CYSTOSCOPY WITH LITHOLAPAXY (N/A)  Patient Location: PACU  Anesthesia Type:General  Level of Consciousness:  sedated, patient cooperative and responds to stimulation  Airway & Oxygen Therapy:Patient Spontanous Breathing and Patient connected to face mask oxgen  Post-op Assessment:  Report given to PACU RN and Post -op Vital signs reviewed and stable  Post vital signs:  Reviewed and stable  Last Vitals:  Vitals:   07/13/16 0547  BP: (!) 155/81  Pulse: 69  Resp: 16  Temp: [id number] C    Complications: No apparent anesthesia complications

## 2016-07-13 NOTE — Op Note (Signed)
Inguinal Hernia, Open, Procedure Note  Pre-operative Diagnosis:  Left inguinal hernia  Post-operative Diagnosis: same  Surgeon:  Earnstine Regal, MD, FACS  Anesthesia:  General  Preparation:  Chlora-prep  Estimated Blood Loss: Minimal  Complications:  none  Indications: The patient presented with a left, reducible hernia.  The patient is a 81 year old male who presents with an inguinal hernia.  Patient is referred by Dr. Franchot Gallo for evaluation of left inguinal hernia. Patient's primary care physician is Dr. Rushie Chestnut. Patient was evaluated by Jiles Crocker at [facility] urology. Patient first noted a left inguinal hernia approximately 1 year ago. This is gradually increased in size. It is causing him some discomfort which she describes as burning. This is intermittent. Patient denies any signs or symptoms of intestinal obstruction. He has had no prior hernia repairs. Patient does have a bladder stone which he has discussed with Dr. Diona Fanti. He would like to consider concurrent removal of the bladder stone at the time we perform his inguinal hernia surgery.  Procedure Details  The patient was evaluated in the holding area. All of the patient's questions were answered and the proposed procedure was confirmed. The site of the procedure was properly marked. The patient was taken to the Operating Room, identified by name, and the procedure verified as inguinal hernia repair.  The patient was placed in the supine position and underwent induction of anesthesia. A "Time Out" was performed per routine. The lower abdomen and groin were prepped and draped in the usual aseptic fashion.  After ascertaining that an adequate level of anesthesia had been obtained, an incision was made in the groin with a #10 blade.  Dissection was carried through the subcutaneous tissues and hemostasis obtained with the electrocautery.  A Gelpi retractor was placed for exposure.  The external oblique  fascia was incised in line with it's fibers and extended through the external inguinal ring.  The cord structures were dissected out of the inguinal canal and encircled with a Penrose drain.  The floor of the inguinal canal was dissected out.  There was laxity of the floor but no fascial defect.  The cord was explored and a large indirect sac was identified.  It was dissected out and opened.  Contents were reduced into the peritoneal cavity.  The sac was closed with interrupted 0-Novofil sutures and excised.  The internal ring was tightened with interrupted 0-Novofil sutures laterally.  The floor of the inguinal canal was reconstructed with Ethicon Ultrapro mesh cut to the appropriate dimensions.  It was secured to the pubic tubercle with a 2-0 Novafil suture and along the inguinal ligament with a running 2-0 Novafil suture.  Mesh was split to accommodate the cord structures.  The superior margin of the mesh was secured to the transversalis and internal oblique musculature with interrupted 2-0 Novafil sutures.  The tails of the mesh were overlapped lateral to the cord structures and secured to the inguinal ligament with interrupted 2-0 Novafil sutures to recreate the internal inguinal ring.  Cord structures were returned to the inguinal canal.  Local anesthetic was infiltrated throughout the field.  External oblique fascia was closed with interrupted 3-0 Vicryl sutures.  Subcutaneous tissues were closed with interrupted 3-0 Vicryl sutures.  Skin was anesthetized with local anesthetic, and the skin edges were re-approximated with a running 4-0 Monocryl suture.  Wound was washed and dried and benzoin and steristrips were applied.  A gauze dressing was applied.  Instrument, sponge, and needle counts were  correct prior to closure and at the conclusion of the case.  The patient tolerated the procedure well.  The patient was awakened from anesthesia and brought to the recovery room in stable condition.  Earnstine Regal, MD, [facility] Office: [phone]

## 2016-07-13 NOTE — Anesthesia Postprocedure Evaluation (Signed)
Anesthesia Post Note  Patient: Joseph Simpson  Procedure(s) Performed: Procedure(s) (LRB): REPAIR LEFT INGUINAL HERNIA (Left) INSERTION OF MESH (Left) CYSTOSCOPY WITH LITHOLAPAXY (N/A)  Patient location during evaluation: PACU Anesthesia Type: General Level of consciousness: sedated Pain management: satisfactory to patient Vital Signs Assessment: post-procedure vital signs reviewed and stable Respiratory status: spontaneous breathing Cardiovascular status: stable Anesthetic complications: no    Last Vitals:  Vitals:   07/13/16 1014 07/13/16 1047  BP: (!) 167/90 (!) 144/82  Pulse: 87 87  Resp: 13 16  Temp: 36.6 C 36.6 C    Last Pain:  Vitals:   07/13/16 1047  TempSrc: Oral  PainSc: [place]

## 2016-07-13 NOTE — Discharge Instructions (Signed)
1. You may see some blood in the urine and may have some burning with urination for 48-72 hours. You also may notice that you have to urinate more frequently or urgently after your procedure which is normal.  2. You should call should you develop an inability urinate, fever > 101, persistent nausea and vomiting that prevents you from eating or drinking to stay hydrated.    If you have a catheter, you will be taught how to take care of the catheter by the nursing staff prior to discharge from the hospital.  You may periodically feel a strong urge to void with the catheter in place.  This is a bladder spasm and most often can occur when having a bowel movement or moving around. It is typically self-limited and usually will stop after a few minutes.  You may use some Vaseline or Neosporin around the tip of the catheter to reduce friction at the tip of the penis. You may also see some blood in the urine.  A very small amount of blood can make the urine look quite red.  As long as the catheter is draining well, there usually is not a problem.  However, if the catheter is not draining well and is bloody, you should call the office [phone]) to notify us.[facility]  HERNIA REPAIR POST OP INSTRUCTIONS  Always review your discharge instruction sheet given to you by the facility where your surgery was performed.  1. A  prescription for pain medication may be given to you upon discharge.  Take your pain medication as prescribed.  If narcotic pain medicine is not needed, then you may take acetaminophen (Tylenol) or ibuprofen (Advil) as needed.  2. Take your usually prescribed medications unless otherwise directed.  3. If you need a refill on your pain medication, please contact your pharmacy.  They will contact our office to request authorization. Prescriptions will not be filled after 5 pm daily or on weekends.  4. You should follow a light diet the first 24 hours after arrival home,  such as soup and crackers or toast.  Be sure to include plenty of fluids daily.  Resume your normal diet the day after surgery.  5. Most patients will experience some swelling and bruising around the surgical site.  Ice packs and reclining will help.  Swelling and bruising can take several days to resolve.   6. It is common to experience some constipation if taking pain medication after surgery.  Increasing fluid intake and taking a stool softener (such as Colace) will usually help or prevent this problem from occurring.  A mild laxative (Milk of Magnesia or Miralax) should be taken according to package directions if there are no bowel movements after 48 hours.  7. Unless discharge instructions indicate otherwise, you may remove your bandages 24-48 hours after surgery, and you may shower at that time.  You will have steri-strips (small skin tapes) in place directly over the incision.  These strips should be left on the skin for 5-7 days.  Any sutures or staples will be removed at the office during your follow-up visit.  8. ACTIVITIES:  You may resume regular (light) daily activities beginning the next day - such as daily self-care, walking, climbing stairs - gradually increasing activities as tolerated.  You may have sexual intercourse when it is comfortable.  Refrain from any heavy lifting or straining until approved by your doctor.  You may drive when you are no longer taking prescription pain medication,  you can comfortably wear a seatbelt, and you can safely maneuver your car and apply brakes.  9. You should see your doctor in the office for a follow-up appointment approximately 2-3 weeks after your surgery.  Make sure that you call for this appointment within a day or two after you arrive home to insure a convenient appointment time. 10.   WHEN TO CALL YOUR DOCTOR: 1. Fever greater than 101.0 2. Inability to urinate 3. Persistent nausea and/or vomiting 4. Extreme swelling or  bruising 5. Continued bleeding from incision 6. Increased pain, redness, or drainage from the incision  The clinic staff is available to answer your questions during regular business hours.  Please dont hesitate to call and ask to speak to one of the nurses for clinical concerns.  If you have a medical emergency, go to the nearest emergency room or call 911.  A surgeon from [facility] is always on call for the hospital.   [facility] [address], [place], [place], [place]  [address]  [phone] ? [phone] ? FAX [phone]  [email or web address]   Instructed pt and family to use clean scissors , clean hands prior to removing the foley catheter.

## 2016-07-20 ENCOUNTER — Encounter (HOSPITAL_COMMUNITY): Payer: Self-pay | Admitting: Emergency Medicine

## 2016-07-20 ENCOUNTER — Emergency Department (HOSPITAL_COMMUNITY)
Admission: EM | Admit: 2016-07-20 | Discharge: 2016-07-20 | Disposition: A | Discharge status: Home / Self Care | Payer: Medicare Other | Attending: Emergency Medicine | Admitting: Emergency Medicine

## 2016-07-20 DIAGNOSIS — I2510 Atherosclerotic heart disease of native coronary artery without angina pectoris: Secondary | ICD-10-CM | POA: Insufficient documentation

## 2016-07-20 DIAGNOSIS — Z79899 Other long term (current) drug therapy: Secondary | ICD-10-CM | POA: Diagnosis not present

## 2016-07-20 DIAGNOSIS — I10 Essential (primary) hypertension: Secondary | ICD-10-CM | POA: Diagnosis not present

## 2016-07-20 DIAGNOSIS — N390 Urinary tract infection, site not specified: Secondary | ICD-10-CM | POA: Diagnosis not present

## 2016-07-20 DIAGNOSIS — Z87891 Personal history of nicotine dependence: Secondary | ICD-10-CM | POA: Diagnosis not present

## 2016-07-20 DIAGNOSIS — R350 Frequency of micturition: Secondary | ICD-10-CM | POA: Diagnosis present

## 2016-07-20 LAB — URINALYSIS, ROUTINE W REFLEX MICROSCOPIC
Bilirubin Urine: NEGATIVE
Glucose, UA: NEGATIVE mg/dL
Ketones, ur: NEGATIVE mg/dL
Nitrite: POSITIVE — AB
Protein, ur: NEGATIVE mg/dL
Specific Gravity, Urine: 1.02 (ref 1.005–1.030)
pH: 6 (ref 5.0–8.0)

## 2016-07-20 LAB — URINE MICROSCOPIC-ADD ON

## 2016-07-20 MED ORDER — CIPROFLOXACIN HCL 500 MG PO TABS: 500.0000 mg | ORAL_TABLET | Freq: Two times a day (BID) | ORAL | 0 refills | Status: DC

## 2016-07-20 MED ORDER — PHENAZOPYRIDINE HCL 200 MG PO TABS: 200.0000 mg | ORAL_TABLET | Freq: Three times a day (TID) | ORAL | 0 refills | Status: DC

## 2016-07-20 NOTE — ED Triage Notes (Addendum)
Pt presents to the ED with complaints of urinary frequency. Pt had hernia surgery last Monday. He states that he had a catheter placed for procedure, catheter was removed on Tuesday, and urinary frequency began on last Wednesday. Pt denies any burning sensation when urinating. Pt also states he has leg swelling bilaterally. Denies fever/chills.

## 2016-07-20 NOTE — ED Provider Notes (Signed)
[facility] DEPT Provider Note   CSN: [id number] Arrival date & time: 07/20/16  1200     History   Chief Complaint Chief Complaint  Patient presents with  . Urinary Frequency    HPI Joseph Simpson is a 81 y.o. male.  81 yo M with a chief complaint of dysuria and increased frequency. Patient recently had a catheter placed for a hernia repair. He also had removal of the renal stone. Patient denies fevers or chills. Symptoms of been persistent since yesterday. Denies sensation that he is unable to empty his bladder fully.   The history is provided by the patient.  Urinary Frequency  This is a new problem. The current episode started yesterday. The problem occurs constantly. The problem has not changed since onset.Pertinent negatives include no chest pain, no abdominal pain, no headaches and no shortness of breath. Nothing aggravates the symptoms. Nothing relieves the symptoms. He has tried nothing for the symptoms. The treatment provided no relief.    Past Medical History:  Diagnosis Date  . Abnormal stress test December 2013   s/p cardiac cath with minimal nonobstructive CAD and normal LV function; medical management recommended  . Arthritis   . CAD (coronary artery disease)   . Dyslipidemia   . Dysrhythmia   . GERD (gastroesophageal reflux disease)   . History of hiatal hernia   . History of kidney stones   . Hypertension   . Macular degeneration   . Melanoma ([place])   . Obstructive sleep apnea 09/23/2007   Wore cpap previously. Stopped and not interested despite risks.     . OSA (obstructive sleep apnea)   . PAC (premature atrial contraction) 10/25/2015  . PVC's (premature ventricular contractions) 09/27/2013  . SVT (supraventricular tachycardia) ([place])   . Syncope     Patient Active Problem List   Diagnosis Date Noted  . Left inguinal hernia 07/12/2016  . Hyperlipidemia 06/10/2016  . PAC (premature atrial contraction) 10/25/2015  . Squamous cell carcinoma in situ  09/09/2015  . BPH (benign prostatic hyperplasia) 11/23/2014  . Palpitations 07/31/2014  . Cough 05/10/2014  . Pulmonary nodule 05/10/2014  . Dyspnea 02/27/2014  . CAD (coronary artery disease)   . Dyslipidemia   . Murmur 02/10/2012  . Macular degeneration 11/17/2011  . Nephrolithiasis 09/04/2011  . MELANOMA, ARM 09/26/2007  . Essential hypertension 09/23/2007  . GERD 09/23/2007    Past Surgical History:  Procedure Laterality Date  . APPENDECTOMY    . CARDIAC CATHETERIZATION  December 2013   minimal nonobstructive CAD with normal LV function  . CATARACT EXTRACTION    . CYSTOSCOPY WITH LITHOLAPAXY N/A 07/13/2016   Procedure: CYSTOSCOPY WITH LITHOLAPAXY;  Surgeon: Franchot Gallo, MD;  Location: [facility];  Service: Urology;  Laterality: N/A;  . CYSTOSCOPY/RETROGRADE/URETEROSCOPY/STONE EXTRACTION WITH BASKET  02/24/2012   Procedure: CYSTOSCOPY/RETROGRADE/URETEROSCOPY/STONE EXTRACTION WITH BASKET;  Surgeon: Franchot Gallo, MD;  [facility];  Service: Urology;  Laterality: Bilateral;  1 HOUR  . GASTRECTOMY    . HEMORRHOID SURGERY    . HERNIA REPAIR  07/13/2016  . INGUINAL HERNIA REPAIR Left 07/13/2016   Procedure: REPAIR LEFT INGUINAL HERNIA;  Surgeon: Armandina Gemma, MD;  Location: [facility];  Service: General;  Laterality: Left;  . INSERTION OF MESH Left 07/13/2016   Procedure: INSERTION OF MESH;  Surgeon: Armandina Gemma, MD;  Location: [facility];  Service: General;  Laterality: Left;  Marland Kitchen MELANOMA EXCISION  05-05-2007   LEFT FOREARM  . NOSE SURGERY    . TONSILLECTOMY  Home Medications    Prior to Admission medications   Medication Sig Start Date End Date Taking? Authorizing Provider  acetaminophen (TYLENOL) 500 MG tablet Take 1,000 mg by mouth every 6 (six) hours as needed. PAIN      Historical Provider, MD  albuterol (PROAIR HFA) 108 (90 BASE) MCG/ACT inhaler Inhale 2 puffs into the lungs every 6 (six) hours as needed for wheezing or shortness of breath.  04/06/14   Chesley Mires, MD  atorvastatin (LIPITOR) 10 MG tablet Take 1 tablet (10 mg total) by mouth daily. 06/10/16   Marin Olp, MD  bevacizumab (AVASTIN) 2.5 mg/0.1 mL SOLN 2.5 mg. Injection to eye every 8 weeks - use with vigamox    Historical Provider, MD  cephALEXin (KEFLEX) 500 MG capsule Take 1 capsule (500 mg total) by mouth 2 (two) times daily. 07/13/16   Franchot Gallo, MD  ciprofloxacin (CIPRO) 500 MG tablet Take 1 tablet (500 mg total) by mouth 2 (two) times daily. One po bid x 7 days 07/20/16   Deno Etienne, DO  HYDROcodone-acetaminophen (NORCO/VICODIN) 5-325 MG tablet Take 1-2 tablets by mouth every 4 (four) hours as needed for moderate pain. 07/13/16   Armandina Gemma, MD  losartan (COZAAR) 100 MG tablet Take 0.5 tablets (50 mg total) by mouth 2 (two) times daily. 01/17/16   Marin Olp, MD  metoCLOPramide (REGLAN) 5 MG tablet Take 5 mg by mouth 2 (two) times daily.    Historical Provider, MD  metoprolol tartrate (LOPRESSOR) 25 MG tablet Take 1 tablet (25 mg total) by mouth 2 (two) times daily. 01/30/16   Peter M Martinique, MD  pantoprazole (PROTONIX) 40 MG tablet Take 40 mg by mouth 2 (two) times daily.     Historical Provider, MD  phenazopyridine (PYRIDIUM) 200 MG tablet Take 1 tablet (200 mg total) by mouth 3 (three) times daily. 07/20/16   Deno Etienne, DO  Polyethyl Glycol-Propyl Glycol (SYSTANE ULTRA) 0.4-0.3 % SOLN Place 1 drop into both eyes 3 (three) times daily.     Historical Provider, MD  tamsulosin (FLOMAX) 0.4 MG CAPS capsule Take 0.4 mg by mouth as needed.  06/22/14   Historical Provider, MD  VIGAMOX 0.5 % ophthalmic solution Take every 8 weeks as directed - use with avastin 05/28/14   Historical Provider, MD    Family History Family History  Problem Relation Age of Onset  . Heart disease Mother     CABG in her 41s  . Pneumonia Father   . Cancer Sister     breast  . Lung cancer Brother     smoker    Social History Social History  Substance Use Topics  . Smoking  status: Former Smoker    Packs/day: 0.10    Years: 4.00    Types: Cigarettes    Quit date: 09/03/1956  . Smokeless tobacco: Never Used  . Alcohol use No     Allergies   Aspirin; Percocet [oxycodone-acetaminophen]; and Vitamins [apatate]   Review of Systems Review of Systems  Constitutional: Negative for chills and fever.  HENT: Negative for congestion and facial swelling.   Eyes: Negative for discharge and visual disturbance.  Respiratory: Negative for shortness of breath.   Cardiovascular: Negative for chest pain and palpitations.  Gastrointestinal: Negative for abdominal pain, diarrhea and vomiting.  Genitourinary: Positive for dysuria and frequency.  Musculoskeletal: Negative for arthralgias and myalgias.  Skin: Negative for color change and rash.  Neurological: Negative for tremors, syncope and headaches.  Psychiatric/Behavioral: Negative for confusion and  dysphoric mood.     Physical Exam Updated Vital Signs BP 123/74 (BP Location: Left Arm)   Pulse 61   Temp 97.6 F (36.4 C) (Oral)   Resp 18   Ht 6\' 1"  (1.854 m)   Wt 186 lb (84.4 kg)   SpO2 98%   BMI 24.54 kg/m   Physical Exam  Constitutional: He is oriented to person, place, and time. He appears well-developed and well-nourished.  HENT:  Head: Normocephalic and atraumatic.  Eyes: Conjunctivae and EOM are normal. Pupils are equal, round, and reactive to light.  Neck: Normal range of motion. No JVD present.  Cardiovascular: Normal rate and regular rhythm.   Pulmonary/Chest: Effort normal. No stridor. No respiratory distress.  Abdominal: He exhibits no distension and no mass. There is no tenderness. There is no guarding.  Musculoskeletal: Normal range of motion. He exhibits no edema.  Neurological: He is alert and oriented to person, place, and time.  Skin: Skin is warm and dry.  Psychiatric: He has a normal mood and affect. His behavior is normal.     ED Treatments / Results  Labs (all labs ordered  are listed, but only abnormal results are displayed) Labs Reviewed  URINALYSIS, ROUTINE W REFLEX MICROSCOPIC (NOT AT [facility]) - Abnormal; Notable for the following:       Result Value   APPearance CLOUDY (*)    Hgb urine dipstick MODERATE (*)    Nitrite POSITIVE (*)    Leukocytes, UA LARGE (*)    All other components within normal limits  URINE MICROSCOPIC-ADD ON - Abnormal; Notable for the following:    Squamous Epithelial / LPF 0-5 (*)    Bacteria, UA MANY (*)    All other components within normal limits  URINE CULTURE    EKG  EKG Interpretation None       Radiology No results found.  Procedures Procedures (including critical care time)  Medications Ordered in ED Medications - No data to display   Initial Impression / Assessment and Plan / ED Course  I have reviewed the triage vital signs and the nursing notes.  Pertinent labs & imaging results that were available during my care of the patient were reviewed by me and considered in my medical decision making (see chart for details).  Clinical Course    81 yo M With a chief complaint of dysuria and increased frequency. Patient found to have a urinary tract infection on UA. He is not retaining urine with a postvoid residual of 90. Will treat him with antibiotics. He was just on Keflex will change and to Cipro. He will discuss this with his urologist morning.  3:30 PM:  I have discussed the diagnosis/risks/treatment options with the patient and family and believe the pt to be eligible for discharge home to follow-up with Urology. We also discussed returning to the ED immediately if new or worsening sx occur. We discussed the sx which are most concerning (e.g., sudden worsening pain, fever, inability to tolerate by mouth) that necessitate immediate return. Medications administered to the patient during their visit and any new prescriptions provided to the patient are listed below.  Medications given during this  visit Medications - No data to display   The patient appears reasonably screen and/or stabilized for discharge and I doubt any other medical condition or other [facility] requiring further screening, evaluation, or treatment in the ED at this time prior to discharge.    Final Clinical Impressions(s) / ED Diagnoses   Final diagnoses:  UTI (lower urinary tract infection)    New Prescriptions Discharge Medication List as of 07/20/2016  1:40 PM    START taking these medications   Details  ciprofloxacin (CIPRO) 500 MG tablet Take 1 tablet (500 mg total) by mouth 2 (two) times daily. One po bid x 7 days, Starting Mon 07/20/2016, Print    phenazopyridine (PYRIDIUM) 200 MG tablet Take 1 tablet (200 mg total) by mouth 3 (three) times daily., Starting Mon 07/20/2016, [place], DO 07/20/16 1530

## 2016-07-22 LAB — URINE CULTURE: Culture: >=100000 — AB

## 2016-07-23 ENCOUNTER — Telehealth (HOSPITAL_BASED_OUTPATIENT_CLINIC_OR_DEPARTMENT_OTHER): Payer: Self-pay | Admitting: *Deleted

## 2016-07-23 NOTE — Telephone Encounter (Signed)
Post ED Visit - Positive Culture Follow-up  Culture report reviewed by antimicrobial stewardship pharmacist:  []  Elenor Quinones, Pharm.D. []  Heide Guile, Pharm.D., BCPS []  Parks Neptune, Pharm.D. []  Alycia Rossetti, Pharm.D., BCPS []  [place], [place].D., BCPS, AAHIVP []  Legrand Como, Pharm.D., BCPS, AAHIVP []  Milus Glazier, Pharm.D. []  Stephens November, Pharm.DDione Housekeeper, Pharm D  Positive urine culture Treated with Ciprofloxacin, organism sensitive to the same and no further patient follow-up is required at this time.  Harlon Flor [facility] 07/23/2016, 11:37 AM

## 2016-07-30 DIAGNOSIS — N210 Calculus in bladder: Secondary | ICD-10-CM | POA: Diagnosis not present

## 2016-08-17 ENCOUNTER — Encounter (INDEPENDENT_AMBULATORY_CARE_PROVIDER_SITE_OTHER): Payer: Medicare Other | Admitting: Ophthalmology

## 2016-08-17 DIAGNOSIS — D3132 Benign neoplasm of left choroid: Secondary | ICD-10-CM | POA: Diagnosis not present

## 2016-08-17 DIAGNOSIS — I10 Essential (primary) hypertension: Secondary | ICD-10-CM | POA: Diagnosis not present

## 2016-08-17 DIAGNOSIS — H35033 Hypertensive retinopathy, bilateral: Secondary | ICD-10-CM | POA: Diagnosis not present

## 2016-08-17 DIAGNOSIS — H43813 Vitreous degeneration, bilateral: Secondary | ICD-10-CM

## 2016-08-17 DIAGNOSIS — H2511 Age-related nuclear cataract, right eye: Secondary | ICD-10-CM

## 2016-08-17 DIAGNOSIS — H353231 Exudative age-related macular degeneration, bilateral, with active choroidal neovascularization: Secondary | ICD-10-CM | POA: Diagnosis not present

## 2016-08-27 ENCOUNTER — Ambulatory Visit (INDEPENDENT_AMBULATORY_CARE_PROVIDER_SITE_OTHER): Payer: Medicare Other | Admitting: Family Medicine

## 2016-08-27 DIAGNOSIS — Z23 Encounter for immunization: Secondary | ICD-10-CM

## 2016-09-22 ENCOUNTER — Other Ambulatory Visit: Payer: Self-pay | Admitting: Family Medicine

## 2016-09-22 NOTE — Telephone Encounter (Signed)
Yes thanks, I am ok with short term refill but if issues after 10 days or shortness of breath or fever- needs to be seen

## 2016-10-28 DIAGNOSIS — Z86008 Personal history of in-situ neoplasm of other site: Secondary | ICD-10-CM | POA: Diagnosis not present

## 2016-10-28 DIAGNOSIS — L570 Actinic keratosis: Secondary | ICD-10-CM | POA: Diagnosis not present

## 2016-10-28 DIAGNOSIS — D1721 Benign lipomatous neoplasm of skin and subcutaneous tissue of right arm: Secondary | ICD-10-CM | POA: Diagnosis not present

## 2016-10-28 DIAGNOSIS — Z85828 Personal history of other malignant neoplasm of skin: Secondary | ICD-10-CM | POA: Diagnosis not present

## 2016-10-28 DIAGNOSIS — Z23 Encounter for immunization: Secondary | ICD-10-CM | POA: Diagnosis not present

## 2016-10-28 DIAGNOSIS — L821 Other seborrheic keratosis: Secondary | ICD-10-CM | POA: Diagnosis not present

## 2016-10-28 DIAGNOSIS — D2230 Melanocytic nevi of unspecified part of face: Secondary | ICD-10-CM | POA: Diagnosis not present

## 2016-10-28 DIAGNOSIS — D1800 Hemangioma unspecified site: Secondary | ICD-10-CM | POA: Diagnosis not present

## 2016-10-30 DIAGNOSIS — N210 Calculus in bladder: Secondary | ICD-10-CM | POA: Diagnosis not present

## 2016-11-08 NOTE — Progress Notes (Signed)
Joseph Simpson Date of Birth: 1935/01/20 Medical Record [id number]  History of Present Illness: Joseph Simpson is seen today for follow up of CAD and HTN. He has had an abnormal stress test myoview in 11/13 with abnormal EKG/runs of SVT and marked ST-T wave changes. Images were normal. Had cardiac cath showing minimal nonobstructive CAD and normal LV function. Managed medically with emphasis on BP control.  He complained of palpitations. A 24-hour Holter monitor  demonstrated occasional PVCs and rare PACs. He had no significant pauses or bradycardia. His minimal heart rate was 55 beats per minute with a mean heart rate of 84 beats per minute. We increased his metoprolol dose and since then he notes improvement in his palpitations.  Previously he developed significant symptoms of lightheadedness, presyncope, and fatigue that improved with reduction in BP meds.  On follow up today he reports he is doing well. He states he only had one significant episode of palpitations this year when he was spraying Roundup on his driveway. He notes that work like mowing or vacuuming will aggravate his symptoms so he avoids this now. Still walks 30 minutes a day without problem.  No chest pain. Breathing is doing well.   Current Outpatient Prescriptions on File Prior to Visit  Medication Sig Dispense Refill  . acetaminophen (TYLENOL) 500 MG tablet Take 1,000 mg by mouth every 6 (six) hours as needed. PAIN      . albuterol (PROAIR HFA) 108 (90 BASE) MCG/ACT inhaler Inhale 2 puffs into the lungs every 6 (six) hours as needed for wheezing or shortness of breath. 1 Inhaler 3  . atorvastatin (LIPITOR) 10 MG tablet Take 1 tablet (10 mg total) by mouth daily. 90 tablet 3  . benzonatate (TESSALON) 100 MG capsule take 1 capsule by mouth twice a day if needed for cough 20 capsule 0  . bevacizumab (AVASTIN) 2.5 mg/0.1 mL SOLN 2.5 mg. Injection to eye every 8 weeks - use with vigamox    . HYDROcodone-acetaminophen (NORCO/VICODIN)  5-325 MG tablet Take 1-2 tablets by mouth every 4 (four) hours as needed for moderate pain. 20 tablet 0  . losartan (COZAAR) 100 MG tablet Take 0.5 tablets (50 mg total) by mouth 2 (two) times daily. 90 tablet 3  . metoprolol tartrate (LOPRESSOR) 25 MG tablet Take 1 tablet (25 mg total) by mouth 2 (two) times daily. 180 tablet 3  . pantoprazole (PROTONIX) 40 MG tablet Take 40 mg by mouth 2 (two) times daily.     . phenazopyridine (PYRIDIUM) 200 MG tablet Take 1 tablet (200 mg total) by mouth 3 (three) times daily. 6 tablet 0  . Polyethyl Glycol-Propyl Glycol (SYSTANE ULTRA) 0.4-0.3 % SOLN Place 1 drop into both eyes 3 (three) times daily.     . tamsulosin (FLOMAX) 0.4 MG CAPS capsule Take 0.4 mg by mouth as needed.     Marland Kitchen VIGAMOX 0.5 % ophthalmic solution Take every 8 weeks as directed - use with avastin     No current facility-administered medications on file prior to visit.     Allergies  Allergen Reactions  . Aspirin Other (See Comments)    UPSET STOMACH  . Percocet [Oxycodone-Acetaminophen] Nausea And Vomiting  . Vitamins [Apatate] Other (See Comments)    Bothers stomach    Past Medical History:  Diagnosis Date  . Abnormal stress test December 2013   s/p cardiac cath with minimal nonobstructive CAD and normal LV function; medical management recommended  . Arthritis   . CAD (coronary artery  disease)   . Dyslipidemia   . Dysrhythmia   . GERD (gastroesophageal reflux disease)   . History of hiatal hernia   . History of kidney stones   . Hypertension   . Macular degeneration   . Melanoma ([place])   . Obstructive sleep apnea 09/23/2007   Wore cpap previously. Stopped and not interested despite risks.     . OSA (obstructive sleep apnea)   . PAC (premature atrial contraction) 10/25/2015  . PVC's (premature ventricular contractions) 09/27/2013  . SVT (supraventricular tachycardia) ([place])   . Syncope     Past Surgical History:  Procedure Laterality Date  . APPENDECTOMY    . CARDIAC  CATHETERIZATION  December 2013   minimal nonobstructive CAD with normal LV function  . CATARACT EXTRACTION    . CYSTOSCOPY WITH LITHOLAPAXY N/A 07/13/2016   Procedure: CYSTOSCOPY WITH LITHOLAPAXY;  Surgeon: Franchot Gallo, MD;  Location: [facility];  Service: Urology;  Laterality: N/A;  . CYSTOSCOPY/RETROGRADE/URETEROSCOPY/STONE EXTRACTION WITH BASKET  02/24/2012   Procedure: CYSTOSCOPY/RETROGRADE/URETEROSCOPY/STONE EXTRACTION WITH BASKET;  Surgeon: Franchot Gallo, MD;  Location: [facility];  Service: Urology;  Laterality: Bilateral;  1 HOUR  . GASTRECTOMY    . HEMORRHOID SURGERY    . HERNIA REPAIR  07/13/2016  . INGUINAL HERNIA REPAIR Left 07/13/2016   Procedure: REPAIR LEFT INGUINAL HERNIA;  Surgeon: Armandina Gemma, MD;  Location: [facility];  Service: General;  Laterality: Left;  . INSERTION OF MESH Left 07/13/2016   Procedure: INSERTION OF MESH;  Surgeon: Armandina Gemma, MD;  Location: [facility];  Service: General;  Laterality: Left;  Marland Kitchen MELANOMA EXCISION  05-05-2007   LEFT FOREARM  . NOSE SURGERY    . TONSILLECTOMY      History  Smoking Status  . Former Smoker  . Packs/day: 0.10  . Years: 4.00  . Types: Cigarettes  . Quit date: 09/03/1956  Smokeless Tobacco  . Never Used    History  Alcohol Use No    Family History  Problem Relation Age of Onset  . Heart disease Mother     CABG in her 71s  . Pneumonia Father   . Cancer Sister     breast  . Lung cancer Brother     smoker    Review of Systems: The review of systems is per the HPI.  All other systems were reviewed and are negative.  Physical Exam: BP (!) 150/77 (BP Location: Right Arm)   Pulse 66   Ht 6\' 1"  (1.854 m)   Wt 188 lb 6.4 oz (85.5 kg)   BMI 24.86 kg/m  Patient is very pleasant and in no acute distress. Skin is warm and dry. Color is normal.  HEENT is unremarkable. Normocephalic/atraumatic. PERRL. Sclera are nonicteric. Neck is supple. No masses. No JVD. Lungs are clear. Cardiac exam shows a regular  rate and rhythm with few extrasystoles. Normal S1-2. No gallop or murmur. Abdomen is soft. Extremities are without edema. Gait and ROM are intact. No gross neurologic deficits noted.   LABORATORY DATA: Lab Results  Component Value Date   WBC 6.4 07/03/2016   HGB 16.4 07/03/2016   HCT 48.7 07/03/2016   PLT 150 07/03/2016   GLUCOSE 105 (H) 07/03/2016   CHOL 167 05/27/2015   TRIG 134.0 05/27/2015   HDL 31.20 (L) 05/27/2015   LDLCALC 109 (H) 05/27/2015   ALT 27 08/12/2015   AST 27 08/12/2015   NA 138 07/03/2016   K 4.3 07/03/2016   CL 101 07/03/2016   CREATININE  0.79 07/03/2016   BUN 13 07/03/2016   CO2 31 07/03/2016   TSH 2.46 08/12/2015   PSA 2.42 10/02/2010   INR 1.0 10/18/2012   Ecg today shows NSR with occ. PAC. Nonspecific ST abnormality. Rate 66. I have personally reviewed and interpreted this study.  Assessment / Plan: 1. CAD - minimal nonobstructive disease by cath. Managed medically. Doing well clinically.   2. HTN - mildly elevated.   Would avoid overly aggressive BP control given prior problems with presyncope.  3. SVT/bradycardia/PVCs- Normal EF by cath and myoview. Symptoms improved with metoprolol and avoidance of certain activities. Continue current therapy.

## 2016-11-12 ENCOUNTER — Ambulatory Visit (INDEPENDENT_AMBULATORY_CARE_PROVIDER_SITE_OTHER): Payer: Medicare Other | Admitting: Cardiology

## 2016-11-12 ENCOUNTER — Encounter: Payer: Self-pay | Admitting: Cardiology

## 2016-11-12 VITALS — BP 150/77 | HR 66 | Ht 73.0 in | Wt 188.4 lb

## 2016-11-12 DIAGNOSIS — I10 Essential (primary) hypertension: Secondary | ICD-10-CM

## 2016-11-12 DIAGNOSIS — I491 Atrial premature depolarization: Secondary | ICD-10-CM

## 2016-11-12 DIAGNOSIS — I2510 Atherosclerotic heart disease of native coronary artery without angina pectoris: Secondary | ICD-10-CM | POA: Diagnosis not present

## 2016-11-12 NOTE — Patient Instructions (Addendum)
Continue your current therapy  I will see you in one year   

## 2016-11-17 ENCOUNTER — Encounter (INDEPENDENT_AMBULATORY_CARE_PROVIDER_SITE_OTHER): Payer: Medicare Other | Admitting: Ophthalmology

## 2016-11-17 DIAGNOSIS — H353221 Exudative age-related macular degeneration, left eye, with active choroidal neovascularization: Secondary | ICD-10-CM | POA: Diagnosis not present

## 2016-11-17 DIAGNOSIS — D3132 Benign neoplasm of left choroid: Secondary | ICD-10-CM

## 2016-11-17 DIAGNOSIS — H10502 Unspecified blepharoconjunctivitis, left eye: Secondary | ICD-10-CM | POA: Diagnosis not present

## 2016-11-17 DIAGNOSIS — H353112 Nonexudative age-related macular degeneration, right eye, intermediate dry stage: Secondary | ICD-10-CM | POA: Diagnosis not present

## 2016-11-17 DIAGNOSIS — I10 Essential (primary) hypertension: Secondary | ICD-10-CM | POA: Diagnosis not present

## 2016-11-17 DIAGNOSIS — H35033 Hypertensive retinopathy, bilateral: Secondary | ICD-10-CM | POA: Diagnosis not present

## 2016-11-17 DIAGNOSIS — H43813 Vitreous degeneration, bilateral: Secondary | ICD-10-CM

## 2016-11-20 ENCOUNTER — Encounter (INDEPENDENT_AMBULATORY_CARE_PROVIDER_SITE_OTHER): Payer: Medicare Other | Admitting: Ophthalmology

## 2016-12-08 ENCOUNTER — Encounter (INDEPENDENT_AMBULATORY_CARE_PROVIDER_SITE_OTHER): Payer: Medicare Other | Admitting: Ophthalmology

## 2016-12-08 DIAGNOSIS — I10 Essential (primary) hypertension: Secondary | ICD-10-CM | POA: Diagnosis not present

## 2016-12-08 DIAGNOSIS — H353112 Nonexudative age-related macular degeneration, right eye, intermediate dry stage: Secondary | ICD-10-CM

## 2016-12-08 DIAGNOSIS — H43813 Vitreous degeneration, bilateral: Secondary | ICD-10-CM

## 2016-12-08 DIAGNOSIS — H35033 Hypertensive retinopathy, bilateral: Secondary | ICD-10-CM | POA: Diagnosis not present

## 2016-12-08 DIAGNOSIS — H353221 Exudative age-related macular degeneration, left eye, with active choroidal neovascularization: Secondary | ICD-10-CM

## 2016-12-08 DIAGNOSIS — D3132 Benign neoplasm of left choroid: Secondary | ICD-10-CM

## 2017-01-05 ENCOUNTER — Encounter (INDEPENDENT_AMBULATORY_CARE_PROVIDER_SITE_OTHER): Payer: Medicare Other | Admitting: Ophthalmology

## 2017-01-05 DIAGNOSIS — H353231 Exudative age-related macular degeneration, bilateral, with active choroidal neovascularization: Secondary | ICD-10-CM | POA: Diagnosis not present

## 2017-01-05 DIAGNOSIS — I10 Essential (primary) hypertension: Secondary | ICD-10-CM | POA: Diagnosis not present

## 2017-01-05 DIAGNOSIS — D3132 Benign neoplasm of left choroid: Secondary | ICD-10-CM

## 2017-01-05 DIAGNOSIS — H43813 Vitreous degeneration, bilateral: Secondary | ICD-10-CM | POA: Diagnosis not present

## 2017-01-05 DIAGNOSIS — H35033 Hypertensive retinopathy, bilateral: Secondary | ICD-10-CM

## 2017-01-08 ENCOUNTER — Other Ambulatory Visit: Payer: Self-pay

## 2017-01-08 ENCOUNTER — Telehealth: Payer: Self-pay | Admitting: Family Medicine

## 2017-01-08 MED ORDER — LOSARTAN POTASSIUM 100 MG PO TABS: 50.0000 mg | ORAL_TABLET | Freq: Two times a day (BID) | ORAL | 3 refills | Status: DC

## 2017-01-08 NOTE — Telephone Encounter (Signed)
Prescription sent over as requested. I called patient and verified his pharmacy

## 2017-01-08 NOTE — Telephone Encounter (Signed)
° ° ° °  Pt request refill of the following:  losartan (COZAAR) 100 MG tablet   Phamacy:  [facility] pharmacy was changed over to [facility]

## 2017-01-11 ENCOUNTER — Telehealth: Payer: Self-pay | Admitting: Family Medicine

## 2017-01-11 ENCOUNTER — Other Ambulatory Visit: Payer: Self-pay

## 2017-01-11 MED ORDER — LOSARTAN POTASSIUM 100 MG PO TABS: 50.0000 mg | ORAL_TABLET | Freq: Two times a day (BID) | ORAL | 3 refills | Status: DC

## 2017-01-11 NOTE — Telephone Encounter (Signed)
Pt would like to see if you could resend losartan to [facility] on [address] and [address] pt is at [facility] and he states they do not have it.

## 2017-01-12 NOTE — Telephone Encounter (Signed)
Prescription was sent 01/11/2017

## 2017-01-28 ENCOUNTER — Other Ambulatory Visit: Payer: Self-pay | Admitting: Cardiology

## 2017-01-28 NOTE — Telephone Encounter (Signed)
°*  STAT* If patient is at the pharmacy, call can be transferred to refill team.   1. Which medications need to be refilled? (please list name of each medication and dose if known) Metoprolol( Needs a New Prescriptions needed )   2. Which pharmacy/location (including street and city if local pharmacy) is medication to be sent to?[facility] on [address]   3. Do they need a 30 day or 90 day supply? [place]

## 2017-02-02 ENCOUNTER — Encounter (INDEPENDENT_AMBULATORY_CARE_PROVIDER_SITE_OTHER): Payer: Medicare Other | Admitting: Ophthalmology

## 2017-02-02 DIAGNOSIS — H43813 Vitreous degeneration, bilateral: Secondary | ICD-10-CM | POA: Diagnosis not present

## 2017-02-02 DIAGNOSIS — I10 Essential (primary) hypertension: Secondary | ICD-10-CM

## 2017-02-02 DIAGNOSIS — H353231 Exudative age-related macular degeneration, bilateral, with active choroidal neovascularization: Secondary | ICD-10-CM

## 2017-02-02 DIAGNOSIS — D3132 Benign neoplasm of left choroid: Secondary | ICD-10-CM

## 2017-02-15 DIAGNOSIS — Z86010 Personal history of colonic polyps: Secondary | ICD-10-CM | POA: Insufficient documentation

## 2017-02-15 DIAGNOSIS — K219 Gastro-esophageal reflux disease without esophagitis: Secondary | ICD-10-CM | POA: Diagnosis not present

## 2017-02-15 DIAGNOSIS — K3184 Gastroparesis: Secondary | ICD-10-CM | POA: Diagnosis not present

## 2017-03-04 ENCOUNTER — Telehealth: Payer: Self-pay | Admitting: Family Medicine

## 2017-03-04 NOTE — Telephone Encounter (Signed)
[facility] - Client [place] [facility] Patient Name: Joseph Simpson DOB: [birth year] Initial Comment caller states he has a runny nose, an sneezing . No cough . He states he has took a box of a sudafed an its not helping . He would like to know what he can take Nurse Assessment Nurse: Hammonds, RN, Epifanio Lesches Date/Time (Eastern Time): 03/04/2017 3:03:37 PM Confirm and document reason for call. If symptomatic, describe symptoms. ---caller states he has a runny nose, an sneezing . No cough . He states he has took a box of a sudafed an its not helping . He would like to know what he can take. No fever. I am on HTN (losarten) (protonix)(Metroprolol)(reglan) Does the patient have any new or worsening symptoms? ---Yes Will a triage be completed? ---Yes Related visit to physician within the last 2 weeks? ---No Does the PT have any chronic conditions? (i.e. diabetes, asthma, etc.) ---YesList chronic conditions. ---HTN Heart skips beat Acid relux Is this a behavioral health or substance abuse call? ---No Guidelines Guideline Title Affirmed Question Affirmed Notes Nasal Allergies (Hay Fever) [1] Nasal allergies AND [4] only certain times of year AND [3] hay fever diagnosis has never been confirmed by a HCP Final Disposition User See PCP within 2 Weeks Hammonds, RN, Lissa Comments Caller going out of town and will contact office once he returns to schedule an appointment. Referrals REFERRED TO PCP OFFICE Disagree/Comply: Comply Call Id: [id number]

## 2017-03-04 NOTE — Telephone Encounter (Signed)
LMTCB

## 2017-03-04 NOTE — Telephone Encounter (Signed)
Spoke with pt and advised he try allergy medications such as Claritin and use saline nasal spray several times a day. Also advised to seek care while out of town if symptoms worsen. Nothing further needed at this time.

## 2017-03-15 ENCOUNTER — Other Ambulatory Visit: Payer: Self-pay | Admitting: Family Medicine

## 2017-03-15 NOTE — Telephone Encounter (Signed)
Would try delsym over the counter first.

## 2017-03-16 ENCOUNTER — Encounter (INDEPENDENT_AMBULATORY_CARE_PROVIDER_SITE_OTHER): Payer: Medicare Other | Admitting: Ophthalmology

## 2017-03-16 DIAGNOSIS — H2511 Age-related nuclear cataract, right eye: Secondary | ICD-10-CM

## 2017-03-16 DIAGNOSIS — H353231 Exudative age-related macular degeneration, bilateral, with active choroidal neovascularization: Secondary | ICD-10-CM

## 2017-03-16 DIAGNOSIS — H43813 Vitreous degeneration, bilateral: Secondary | ICD-10-CM | POA: Diagnosis not present

## 2017-03-16 DIAGNOSIS — H35033 Hypertensive retinopathy, bilateral: Secondary | ICD-10-CM

## 2017-03-16 DIAGNOSIS — I10 Essential (primary) hypertension: Secondary | ICD-10-CM

## 2017-03-16 DIAGNOSIS — D3132 Benign neoplasm of left choroid: Secondary | ICD-10-CM

## 2017-03-17 NOTE — Telephone Encounter (Signed)
Left a message for a return call.

## 2017-03-18 NOTE — Telephone Encounter (Signed)
° ° ° ° °  Spoke with pt and told him what Dr Yong Channel wanted him to try

## 2017-04-01 DIAGNOSIS — H2511 Age-related nuclear cataract, right eye: Secondary | ICD-10-CM | POA: Diagnosis not present

## 2017-04-01 DIAGNOSIS — Z961 Presence of intraocular lens: Secondary | ICD-10-CM | POA: Diagnosis not present

## 2017-04-01 DIAGNOSIS — H25041 Posterior subcapsular polar age-related cataract, right eye: Secondary | ICD-10-CM | POA: Diagnosis not present

## 2017-04-01 DIAGNOSIS — H35033 Hypertensive retinopathy, bilateral: Secondary | ICD-10-CM | POA: Diagnosis not present

## 2017-04-01 DIAGNOSIS — H25011 Cortical age-related cataract, right eye: Secondary | ICD-10-CM | POA: Diagnosis not present

## 2017-04-01 DIAGNOSIS — H353231 Exudative age-related macular degeneration, bilateral, with active choroidal neovascularization: Secondary | ICD-10-CM | POA: Diagnosis not present

## 2017-04-20 DIAGNOSIS — H2511 Age-related nuclear cataract, right eye: Secondary | ICD-10-CM | POA: Diagnosis not present

## 2017-04-20 DIAGNOSIS — H25811 Combined forms of age-related cataract, right eye: Secondary | ICD-10-CM | POA: Diagnosis not present

## 2017-04-27 ENCOUNTER — Encounter (INDEPENDENT_AMBULATORY_CARE_PROVIDER_SITE_OTHER): Payer: Medicare Other | Admitting: Ophthalmology

## 2017-04-27 DIAGNOSIS — H353231 Exudative age-related macular degeneration, bilateral, with active choroidal neovascularization: Secondary | ICD-10-CM

## 2017-04-27 DIAGNOSIS — D3132 Benign neoplasm of left choroid: Secondary | ICD-10-CM | POA: Diagnosis not present

## 2017-04-27 DIAGNOSIS — H35033 Hypertensive retinopathy, bilateral: Secondary | ICD-10-CM

## 2017-04-27 DIAGNOSIS — H43813 Vitreous degeneration, bilateral: Secondary | ICD-10-CM | POA: Diagnosis not present

## 2017-04-27 DIAGNOSIS — I10 Essential (primary) hypertension: Secondary | ICD-10-CM

## 2017-04-27 DIAGNOSIS — H33301 Unspecified retinal break, right eye: Secondary | ICD-10-CM | POA: Diagnosis not present

## 2017-04-29 DIAGNOSIS — Z85820 Personal history of malignant melanoma of skin: Secondary | ICD-10-CM | POA: Diagnosis not present

## 2017-04-29 DIAGNOSIS — D1800 Hemangioma unspecified site: Secondary | ICD-10-CM | POA: Diagnosis not present

## 2017-04-29 DIAGNOSIS — Z85828 Personal history of other malignant neoplasm of skin: Secondary | ICD-10-CM | POA: Diagnosis not present

## 2017-04-29 DIAGNOSIS — D2239 Melanocytic nevi of other parts of face: Secondary | ICD-10-CM | POA: Diagnosis not present

## 2017-04-29 DIAGNOSIS — L570 Actinic keratosis: Secondary | ICD-10-CM | POA: Diagnosis not present

## 2017-04-29 DIAGNOSIS — D1721 Benign lipomatous neoplasm of skin and subcutaneous tissue of right arm: Secondary | ICD-10-CM | POA: Diagnosis not present

## 2017-04-30 ENCOUNTER — Encounter (INDEPENDENT_AMBULATORY_CARE_PROVIDER_SITE_OTHER): Payer: Medicare Other | Admitting: Ophthalmology

## 2017-04-30 DIAGNOSIS — H33301 Unspecified retinal break, right eye: Secondary | ICD-10-CM

## 2017-05-26 ENCOUNTER — Ambulatory Visit (INDEPENDENT_AMBULATORY_CARE_PROVIDER_SITE_OTHER): Payer: Medicare Other | Admitting: Internal Medicine

## 2017-05-26 ENCOUNTER — Encounter: Payer: Self-pay | Admitting: Internal Medicine

## 2017-05-26 VITALS — BP 134/90 | HR 87 | Temp 98.6°F | Wt 184.5 lb

## 2017-05-26 DIAGNOSIS — J019 Acute sinusitis, unspecified: Secondary | ICD-10-CM

## 2017-05-26 DIAGNOSIS — J069 Acute upper respiratory infection, unspecified: Secondary | ICD-10-CM

## 2017-05-26 MED ORDER — DOXYCYCLINE HYCLATE 100 MG PO TABS: 100.0000 mg | ORAL_TABLET | Freq: Two times a day (BID) | ORAL | 0 refills | Status: DC

## 2017-05-26 MED ORDER — BENZONATATE 100 MG PO CAPS: ORAL_CAPSULE | ORAL | 0 refills | Status: DC

## 2017-05-26 NOTE — Patient Instructions (Addendum)
Your physical exam is reassuring today. This acts like a viral respiratory infection of the head and the chest however because you have had then nose symptoms for weeks and weeks it is possible that you are getting a secondary bacterial sinus infection. Try saline nose spray Tylenol if needed for aching in gargles You can try Mucinex to loosen urine mucus up but it is not curative. If you're not improving after another few days her get face pain coughing up thick her phlegm again the antibiotic.  Most viral infections last 10 - 14 days but  Feel better after a week .  Antibiotics do not help viral infections .

## 2017-05-26 NOTE — Progress Notes (Signed)
Pre visit review using our clinic review tool, if applicable. No additional management support is needed unless otherwise documented below in the visit note. 

## 2017-05-26 NOTE — Progress Notes (Signed)
Subjective:   Joseph Simpson is a 82 y.o. male who presents for Medicare Annual/Subsequent preventive examination.  Review of Systems:  No ROS.  Medicare Wellness Visit. Additional risk factors are reflected in the social history.  Cardiac Risk Factors include: dyslipidemia;advanced age (>8men, >42 women);male gender;hypertension   Sleep patterns:  6-8 hrs/night. Wakes up feeling tired. Home Safety/Smoke Alarms: Feels safe in home. Smoke alarms in place.  Living environment; residence and Firearm Safety: Lives in two story. Mostly lives on one story. Widow, lives alone. Seat Belt Safety/Bike Helmet: Wears seat belt.   Counseling:   Dental- Upper and lower dentures.  Male:   CCS-    12/19/2013 Pathology sent. PRN follow up per pt. PSA-  Lab Results  Component Value Date   PSA 2.42 10/02/2010   PSA 1.63 10/07/2009   PSA normal 10/17/2007       Objective:    Vitals: BP (!) 150/62 (BP Location: Left Arm, Patient Position: Sitting, Cuff Size: Normal)   Pulse 88   Resp 16   Ht 6\' 1"  (1.854 m)   Wt 188 lb (85.3 kg)   SpO2 97%   BMI 24.80 kg/m   Body mass index is 24.8 kg/m.  Tobacco History  Smoking Status  . Former Smoker  . Packs/day: 0.10  . Years: 4.00  . Types: Cigarettes  . Quit date: 09/03/1956  Smokeless Tobacco  . Never Used     Counseling given: Not Answered   Past Medical History:  Diagnosis Date  . Abnormal stress test December 2013   s/p cardiac cath with minimal nonobstructive CAD and normal LV function; medical management recommended  . Arthritis   . CAD (coronary artery disease)   . Dyslipidemia   . Dysrhythmia   . GERD (gastroesophageal reflux disease)   . History of hiatal hernia   . History of kidney stones   . Hypertension   . Macular degeneration   . Melanoma ([place])   . Obstructive sleep apnea 09/23/2007   Wore cpap previously. Stopped and not interested despite risks.     . OSA (obstructive sleep apnea)   . PAC (premature atrial  contraction) 10/25/2015  . PVC's (premature ventricular contractions) 09/27/2013  . SVT (supraventricular tachycardia) ([place])   . Syncope    Past Surgical History:  Procedure Laterality Date  . APPENDECTOMY    . CARDIAC CATHETERIZATION  December 2013   minimal nonobstructive CAD with normal LV function  . CATARACT EXTRACTION    . CYSTOSCOPY WITH LITHOLAPAXY N/A 07/13/2016   Procedure: CYSTOSCOPY WITH LITHOLAPAXY;  Surgeon: Franchot Gallo, MD;  Location: [facility];  Service: Urology;  Laterality: N/A;  . CYSTOSCOPY/RETROGRADE/URETEROSCOPY/STONE EXTRACTION WITH BASKET  02/24/2012   Procedure: CYSTOSCOPY/RETROGRADE/URETEROSCOPY/STONE EXTRACTION WITH BASKET;  Surgeon: Franchot Gallo, MD;  Location: [facility];  Service: Urology;  Laterality: Bilateral;  1 HOUR  . GASTRECTOMY    . HEMORRHOID SURGERY    . HERNIA REPAIR  07/13/2016  . INGUINAL HERNIA REPAIR Left 07/13/2016   Procedure: REPAIR LEFT INGUINAL HERNIA;  Surgeon: Armandina Gemma, MD;  Location: [facility];  Service: General;  Laterality: Left;  . INSERTION OF MESH Left 07/13/2016   Procedure: INSERTION OF MESH;  Surgeon: Armandina Gemma, MD;  Location: [facility];  Service: General;  Laterality: Left;  Marland Kitchen MELANOMA EXCISION  05-05-2007   LEFT FOREARM  . NOSE SURGERY    . TONSILLECTOMY     Family History  Problem Relation Age of Onset  . Heart disease Mother  CABG in her 68s  . Pneumonia Father   . Cancer Sister        breast  . Lung cancer Brother        smoker   History  Sexual Activity  . Sexual activity: Yes    Outpatient Encounter Prescriptions as of 06/02/2017  Medication Sig  . acetaminophen (TYLENOL) 500 MG tablet Take 1,000 mg by mouth every 6 (six) hours as needed. PAIN    . albuterol (PROAIR HFA) 108 (90 BASE) MCG/ACT inhaler Inhale 2 puffs into the lungs every 6 (six) hours as needed for wheezing or shortness of breath.  Marland Kitchen atorvastatin (LIPITOR) 10 MG tablet Take 1 tablet (10 mg total) by mouth daily.  .  benzonatate (TESSALON) 100 MG capsule take 1 capsule by mouth twice a day if needed for cough  . bevacizumab (AVASTIN) 2.5 mg/0.1 mL SOLN 2.5 mg. Injection to eye every 8 weeks - use with vigamox  . doxycycline (VIBRA-TABS) 100 MG tablet Take 1 tablet (100 mg total) by mouth 2 (two) times daily.  Marland Kitchen HYDROcodone-acetaminophen (NORCO/VICODIN) 5-325 MG tablet Take 1-2 tablets by mouth every 4 (four) hours as needed for moderate pain.  Marland Kitchen losartan (COZAAR) 100 MG tablet Take 0.5 tablets (50 mg total) by mouth 2 (two) times daily.  . metoCLOPramide (REGLAN) 10 MG tablet Take 10 mg by mouth 2 (two) times daily.  . metoprolol tartrate (LOPRESSOR) 25 MG tablet Take 1 tablet (25 mg total) by mouth 2 (two) times daily.  . pantoprazole (PROTONIX) 40 MG tablet Take 40 mg by mouth 2 (two) times daily.   . phenazopyridine (PYRIDIUM) 200 MG tablet Take 1 tablet (200 mg total) by mouth 3 (three) times daily.  [name] Glycol-Propyl Glycol (SYSTANE ULTRA) 0.4-0.3 % SOLN Place 1 drop into both eyes 3 (three) times daily.   . tamsulosin (FLOMAX) 0.4 MG CAPS capsule Take 0.4 mg by mouth as needed.   Marland Kitchen VIGAMOX 0.5 % ophthalmic solution Take every 8 weeks as directed - use with avastin   No facility-administered encounter medications on file as of 06/02/2017.     Activities of Daily Living In your present state of health, do you have any difficulty performing the following activities: 06/02/2017 07/03/2016  Hearing? N N  Vision? N N  Difficulty concentrating or making decisions? N N  Walking or climbing stairs? Y N  Dressing or bathing? N N  Doing errands, shopping? N N  Preparing Food and eating ? N -  Using the Toilet? N -  In the past six months, have you accidently leaked urine? N -  Do you have problems with loss of bowel control? N -  Managing your Medications? N -  Managing your Finances? N -  Housekeeping or managing your Housekeeping? N -  Some recent data might be hidden    Patient Care  Team: Marin Olp, MD as PCP - General (Family Medicine)   Assessment:    Physical assessment deferred to PCP.  Exercise Activities and Dietary recommendations Current Exercise Habits: Home exercise routine, Type of exercise: strength training/weights, Time (Minutes): 15, Frequency (Times/Week): 3, Weekly Exercise (Minutes/Week): 45, Intensity: Moderate, Exercise limited by: None identified   Diet (meal preparation, eat out, water intake, caffeinated beverages, dairy products, fruits and vegetables): 2-3 meals/day. No coffee. Drinks 5 glasses water, 1 glass of grape juice. Avoids caffeine. Pt believes he eats a healthy diet.  Breakfast: Eggs, hash browns. Toast, grape juice.  Lunch: Sandwich or snack of crackers. Dinner:  Vegetables or spaghetti     Most meals are eaten at restaurant. States he eats mostly vegetables and chicken.  Goals    . Maintain current health status.       Fall Risk Fall Risk  06/02/2017 06/10/2016 03/04/2015 01/29/2014  Falls in the past year? No No No No   Depression Screen PHQ 2/9 Scores 06/02/2017 06/10/2016 03/04/2015 01/29/2014  PHQ - 2 Score 0 0 0 0    Cognitive Function   Ad8 score reviewed for issues:  Issues making decisions:no  Less interest in hobbies / activities:no  Repeats questions, stories (family complaining):no  Trouble using ordinary gadgets (microwave, computer, phone):no  Forgets the month or year: no  Mismanaging finances: no  Remembering appts:no  Daily problems with thinking and/or memory:no Ad8 score is=0       Immunization History  Administered Date(s) Administered  . Influenza Split 09/04/2011, 08/19/2012  . Influenza Whole 08/31/2007, 09/16/2008, 10/07/2009, 10/01/2010  . Influenza, High Dose Seasonal PF 08/25/2013, 09/24/2015, 08/27/2016  . Influenza,inj,Quad PF,36+ Mos 09/05/2014  . Pneumococcal Conjugate-13 11/23/2014  . Pneumococcal Polysaccharide-23 10/16/2002  . Td 03/04/1998, 10/09/2008  . Zoster  04/28/2011   Screening Tests Health Maintenance  Topic Date Due  . INFLUENZA VACCINE  06/16/2017  . TETANUS/TDAP  10/09/2018  . PNA vac Low Risk Adult  Completed      Plan:    Bring a copy of your advance directives to your next office visit.  Gave pt information regarding Shingrix, he is still recovering from upper respiratory infection. I suggested he speak with PCP at next visit regarding clearance for vaccine.  I have personally reviewed and noted the following in the patient's chart:   . Medical and social history . Use of alcohol, tobacco or illicit drugs  . Current medications and supplements . Functional ability and status . Nutritional status . Physical activity . Advanced directives . List of other physicians . Vitals . Screenings to include cognitive, depression, and falls . Referrals and appointments  In addition, I have reviewed and discussed with patient certain preventive protocols, quality metrics, and best practice recommendations. A written personalized care plan for preventive services as well as general preventive health recommendations were provided to patient.     Ree Edman, RN  06/02/2017

## 2017-05-26 NOTE — Progress Notes (Signed)
Chief Complaint  Patient presents with  . Cough    started a week ago   . Sore Throat  . Generalized Body Aches    HPI: Joseph Simpson 82 y.o.  sda  PCP NA   Onset with runny nose and calritin with help but getting worse and has sore throat .  And now deep cough with yellow phlegm   continued nasal drainage   Without face pain  Achy all over but no spec fever  .  No fever  .   Chest coughing yellow  Stuff. Nasal drainage  Has been going on since mid June  . And now above sx  No hx oflung disease copd and asthma  ROS: See pertinent positives and negatives per HPI.  Past Medical History:  Diagnosis Date  . Abnormal stress test December 2013   s/p cardiac cath with minimal nonobstructive CAD and normal LV function; medical management recommended  . Arthritis   . CAD (coronary artery disease)   . Dyslipidemia   . Dysrhythmia   . GERD (gastroesophageal reflux disease)   . History of hiatal hernia   . History of kidney stones   . Hypertension   . Macular degeneration   . Melanoma ([place])   . Obstructive sleep apnea 09/23/2007   Wore cpap previously. Stopped and not interested despite risks.     . OSA (obstructive sleep apnea)   . PAC (premature atrial contraction) 10/25/2015  . PVC's (premature ventricular contractions) 09/27/2013  . SVT (supraventricular tachycardia) ([place])   . Syncope     Family History  Problem Relation Age of Onset  . Heart disease Mother        CABG in her 67s  . Pneumonia Father   . Cancer Sister        breast  . Lung cancer Brother        smoker    Social History   Social History  . Marital status: Widowed    Spouse name: N/A  . Number of children: 1  . Years of education: N/A   Social History Main Topics  . Smoking status: Former Smoker    Packs/day: 0.10    Years: 4.00    Types: Cigarettes    Quit date: 09/03/1956  . Smokeless tobacco: Never Used  . Alcohol use No  . Drug use: No  . Sexual activity: Yes   Other Topics Concern    . 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       Hobbies: ride motorcycle (slingshot 3 year), rabbit and dear hunt    Outpatient Medications Prior to Visit  Medication Sig Dispense Refill  . acetaminophen (TYLENOL) 500 MG tablet Take 1,000 mg by mouth every 6 (six) hours as needed. PAIN      . albuterol (PROAIR HFA) 108 (90 BASE) MCG/ACT inhaler Inhale 2 puffs into the lungs every 6 (six) hours as needed for wheezing or shortness of breath. 1 Inhaler 3  . atorvastatin (LIPITOR) 10 MG tablet Take 1 tablet (10 mg total) by mouth daily. 90 tablet 3  . bevacizumab (AVASTIN) 2.5 mg/0.1 mL SOLN 2.5 mg. Injection to eye every 8 weeks - use with vigamox    . HYDROcodone-acetaminophen (NORCO/VICODIN) 5-325 MG tablet Take 1-2 tablets by mouth every 4 (four)  hours as needed for moderate pain. 20 tablet 0  . losartan (COZAAR) 100 MG tablet Take 0.5 tablets (50 mg total) by mouth 2 (two) times daily. 90 tablet 3  . metoCLOPramide (REGLAN) 10 MG tablet Take 10 mg by mouth 2 (two) times daily.  0  . metoprolol tartrate (LOPRESSOR) 25 MG tablet Take 1 tablet (25 mg total) by mouth 2 (two) times daily. 180 tablet 2  . pantoprazole (PROTONIX) 40 MG tablet Take 40 mg by mouth 2 (two) times daily.     . phenazopyridine (PYRIDIUM) 200 MG tablet Take 1 tablet (200 mg total) by mouth 3 (three) times daily. 6 tablet 0  . Polyethyl Glycol-Propyl Glycol (SYSTANE ULTRA) 0.4-0.3 % SOLN Place 1 drop into both eyes 3 (three) times daily.     . tamsulosin (FLOMAX) 0.4 MG CAPS capsule Take 0.4 mg by mouth as needed.     Marland Kitchen VIGAMOX 0.5 % ophthalmic solution Take every 8 weeks as directed - use with avastin    . benzonatate (TESSALON) 100 MG capsule take 1 capsule by mouth twice a day if needed for cough 20 capsule 0   No facility-administered medications  prior to visit.      EXAM:  BP 134/90 (BP Location: Left Arm, Patient Position: Sitting, Cuff Size: Normal)   Pulse 87   Temp 98.6 F (37 C) (Oral)   Wt 184 lb 8 oz (83.7 kg)   SpO2 97%   BMI 24.34 kg/m   Body mass index is 24.34 kg/m. WDWN in NAD  quiet respirations; mildly congested  somewhat hoarse. Non toxic . HEENT: Normocephalic ;atraumatic , Eyes;  PERRL, EOMs  Full, lids and conjunctiva clear,,Ears: no deformities, canals nl, TM landmarks normal, Nose: no deformity congested  usig tissues ;face non tender Mouth : OP clear without lesion or edema .  1 + red  pnd   Neck: Supple without adenopathy or masses or bruits Chest:  Clear to A without wheezes rales  bs seems = ?  CV:  S1-S2 no gallops or murmurs peripheral perfusion is normal Skin :nl perfusion and no acute rashes  Neuro grossly non focal  ASSESSMENT AND PLAN:  Discussed the following assessment and plan:  Protracted URI - at risk secondary bacteria based on  proloned course and worsening vs other   Acute rhinosinusitis Prolonged uri  And now  Achy st and inc cough  Could be interim  Viral infection but at risk for bacterial cause has had sx since mid June .   Printed rx  To use  Only if not    Improving as expected after discussion over the weekend .    Sent in [place] at pat request  -Patient advised to return or notify health care team  if symptoms worsen ,persist or new concerns arise.  Patient Instructions  Your physical exam is reassuring today. This acts like a viral respiratory infection of the head and the chest however because you have had then nose symptoms for weeks and weeks it is possible that you are getting a secondary bacterial sinus infection. Try saline nose spray Tylenol if needed for aching in gargles You can try Mucinex to loosen urine mucus up but it is not curative. If you're not improving after another few days her get face pain coughing up thick her phlegm again the  antibiotic.  Most viral infections last 10 - 14 days but  Feel better after a week .  Antibiotics do not help viral infections .  Standley Brooking. Aviraj Kentner M.D.

## 2017-06-02 ENCOUNTER — Ambulatory Visit (INDEPENDENT_AMBULATORY_CARE_PROVIDER_SITE_OTHER): Payer: Medicare Other

## 2017-06-02 VITALS — BP 150/62 | HR 88 | Resp 16 | Ht 73.0 in | Wt 188.0 lb

## 2017-06-02 DIAGNOSIS — I10 Essential (primary) hypertension: Secondary | ICD-10-CM

## 2017-06-02 DIAGNOSIS — Z0000 Encounter for general adult medical examination without abnormal findings: Secondary | ICD-10-CM

## 2017-06-02 DIAGNOSIS — Z0001 Encounter for general adult medical examination with abnormal findings: Secondary | ICD-10-CM

## 2017-06-02 NOTE — Patient Instructions (Addendum)
Bring a copy of your advance directives to your next office visit. Discuss shingles vaccine with Dr Yong Channel at your next visit.  Preventive Care 25 Years and Older, Male Preventive care refers to lifestyle choices and visits with your health care provider that can promote health and wellness. What does preventive care include?  A yearly physical exam. This is also called an annual well check.  Dental exams once or twice a year.  Routine eye exams. Ask your health care provider how often you should have your eyes checked.  Personal lifestyle choices, including: ? Daily care of your teeth and gums. ? Regular physical activity. ? Eating a healthy diet. ? Avoiding tobacco and drug use. ? Limiting alcohol use. ? Practicing safe sex. ? Taking low doses of aspirin every day. ? Taking vitamin and mineral supplements as recommended by your health care provider. What happens during an annual well check? The services and screenings done by your health care provider during your annual well check will depend on your age, overall health, lifestyle risk factors, and family history of disease. Counseling Your health care provider may ask you questions about your:  Alcohol use.  Tobacco use.  Drug use.  Emotional well-being.  Home and relationship well-being.  Sexual activity.  Eating habits.  History of falls.  Memory and ability to understand (cognition).  Work and work Statistician.  Screening You may have the following tests or measurements:  Height, weight, and BMI.  Blood pressure.  Lipid and cholesterol levels. These may be checked every 5 years, or more frequently if you are over 13 years old.  Skin check.  Lung cancer screening. You may have this screening every year starting at age 38 if you have a 30-pack-year history of smoking and currently smoke or have quit within the past 15 years.  Fecal occult blood test (FOBT) of the stool. You may have this test every year  starting at age 27.  Flexible sigmoidoscopy or colonoscopy. You may have a sigmoidoscopy every 5 years or a colonoscopy every 10 years starting at age 85.  Prostate cancer screening. Recommendations will vary depending on your family history and other risks.  Hepatitis C blood test.  Hepatitis B blood test.  Sexually transmitted disease (STD) testing.  Diabetes screening. This is done by checking your blood sugar (glucose) after you have not eaten for a while (fasting). You may have this done every 1-3 years.  Abdominal aortic aneurysm (AAA) screening. You may need this if you are a current or former smoker.  Osteoporosis. You may be screened starting at age 2 if you are at high risk.  Talk with your health care provider about your test results, treatment options, and if necessary, the need for more tests. Vaccines Your health care provider may recommend certain vaccines, such as:  Influenza vaccine. This is recommended every year.  Tetanus, diphtheria, and acellular pertussis (Tdap, Td) vaccine. You may need a Td booster every 10 years.  Varicella vaccine. You may need this if you have not been vaccinated.  Zoster vaccine. You may need this after age 46.  Measles, mumps, and rubella (MMR) vaccine. You may need at least one dose of MMR if you were born in 1957 or later. You may also need a second dose.  Pneumococcal 13-valent conjugate (PCV13) vaccine. One dose is recommended after age 12.  Pneumococcal polysaccharide (PPSV23) vaccine. One dose is recommended after age 20.  Meningococcal vaccine. You may need this if you have certain conditions.  Hepatitis A vaccine. You may need this if you have certain conditions or if you travel or work in places where you may be exposed to hepatitis A.  Hepatitis B vaccine. You may need this if you have certain conditions or if you travel or work in places where you may be exposed to hepatitis B.  Haemophilus influenzae type b (Hib)  vaccine. You may need this if you have certain risk factors.  Talk to your health care provider about which screenings and vaccines you need and how often you need them. This information is not intended to replace advice given to you by your health care provider. Make sure you discuss any questions you have with your health care provider. Document Released: 11/29/2015 Document Revised: 07/22/2016 Document Reviewed: 09/03/2015 Elsevier Interactive Patient Education  2017 [employer].

## 2017-06-03 NOTE — Progress Notes (Signed)
I have reviewed and agree with note, evaluation, plan.  BP Readings from Last 3 Encounters:  06/02/17 (!) 150/62  05/26/17 134/90  11/12/16 (!) 150/77  Noted elevated BP- Roselyn Reef or Cassie can you set up follow up with me within the next month?   Garret Reddish, MD

## 2017-06-03 NOTE — Progress Notes (Signed)
Perfect thanks

## 2017-06-08 ENCOUNTER — Encounter (INDEPENDENT_AMBULATORY_CARE_PROVIDER_SITE_OTHER): Payer: Medicare Other | Admitting: Ophthalmology

## 2017-06-08 DIAGNOSIS — H43813 Vitreous degeneration, bilateral: Secondary | ICD-10-CM | POA: Diagnosis not present

## 2017-06-08 DIAGNOSIS — I10 Essential (primary) hypertension: Secondary | ICD-10-CM

## 2017-06-08 DIAGNOSIS — D3132 Benign neoplasm of left choroid: Secondary | ICD-10-CM | POA: Diagnosis not present

## 2017-06-08 DIAGNOSIS — H353231 Exudative age-related macular degeneration, bilateral, with active choroidal neovascularization: Secondary | ICD-10-CM

## 2017-06-08 DIAGNOSIS — H35033 Hypertensive retinopathy, bilateral: Secondary | ICD-10-CM

## 2017-06-08 DIAGNOSIS — H33301 Unspecified retinal break, right eye: Secondary | ICD-10-CM | POA: Diagnosis not present

## 2017-06-10 ENCOUNTER — Telehealth: Payer: Self-pay

## 2017-06-10 ENCOUNTER — Encounter: Payer: Self-pay | Admitting: Family Medicine

## 2017-06-10 ENCOUNTER — Ambulatory Visit (INDEPENDENT_AMBULATORY_CARE_PROVIDER_SITE_OTHER): Payer: Medicare Other | Admitting: Family Medicine

## 2017-06-10 DIAGNOSIS — E785 Hyperlipidemia, unspecified: Secondary | ICD-10-CM

## 2017-06-10 DIAGNOSIS — I10 Essential (primary) hypertension: Secondary | ICD-10-CM | POA: Diagnosis not present

## 2017-06-10 DIAGNOSIS — I2510 Atherosclerotic heart disease of native coronary artery without angina pectoris: Secondary | ICD-10-CM | POA: Diagnosis not present

## 2017-06-10 DIAGNOSIS — K219 Gastro-esophageal reflux disease without esophagitis: Secondary | ICD-10-CM

## 2017-06-10 DIAGNOSIS — N401 Enlarged prostate with lower urinary tract symptoms: Secondary | ICD-10-CM

## 2017-06-10 DIAGNOSIS — R351 Nocturia: Secondary | ICD-10-CM

## 2017-06-10 LAB — COMPREHENSIVE METABOLIC PANEL
ALT: 17 U/L (ref 0–53)
AST: 20 U/L (ref 0–37)
Albumin: 4.2 g/dL (ref 3.5–5.2)
Alkaline Phosphatase: 48 U/L (ref 39–117)
BUN: 10 mg/dL (ref 6–23)
CO2: 32 mEq/L (ref 19–32)
Calcium: 10.1 mg/dL (ref 8.4–10.5)
Chloride: 99 mEq/L (ref 96–112)
Creatinine, Ser: 0.83 mg/dL (ref 0.40–1.50)
GFR: 94.61 mL/min (ref >60.00)
Glucose, Bld: 96 mg/dL (ref 70–99)
Potassium: 4.1 mEq/L (ref 3.5–5.1)
Sodium: 139 mEq/L (ref 135–145)
Total Bilirubin: 1.2 mg/dL (ref 0.2–1.2)
Total Protein: 6.9 g/dL (ref 6.0–8.3)

## 2017-06-10 LAB — CBC
HCT: 47.3 % (ref 39.0–52.0)
Hemoglobin: 15.9 g/dL (ref 13.0–17.0)
MCHC: 33.5 g/dL (ref 30.0–36.0)
MCV: 97.3 fl (ref 78.0–100.0)
Platelets: 167 10*3/uL (ref 150.0–400.0)
RBC: 4.87 Mil/uL (ref 4.22–5.81)
RDW: 12.9 % (ref 11.5–15.5)
WBC: 6.2 10*3/uL (ref 4.0–10.5)

## 2017-06-10 LAB — LIPID PANEL
Cholesterol: 171 mg/dL (ref 0–200)
HDL: 34 mg/dL — ABNORMAL LOW (ref >39.00)
LDL Cholesterol: 99 mg/dL (ref 0–99)
NonHDL: 137.3
Total CHOL/HDL Ratio: 5
Triglycerides: 194 mg/dL — ABNORMAL HIGH (ref 0.0–149.0)
VLDL: 38.8 mg/dL (ref 0.0–40.0)

## 2017-06-10 MED ORDER — BENZONATATE 100 MG PO CAPS: ORAL_CAPSULE | ORAL | 0 refills | Status: DC

## 2017-06-10 MED ORDER — ALBUTEROL SULFATE HFA 108 (90 BASE) MCG/ACT IN AERS: 2.0000 | INHALATION_SPRAY | Freq: Four times a day (QID) | RESPIRATORY_TRACT | 3 refills | Status: DC | PRN

## 2017-06-10 MED ORDER — AMLODIPINE BESYLATE 2.5 MG PO TABS: 2.5000 mg | ORAL_TABLET | Freq: Every day | ORAL | 3 refills | Status: DC

## 2017-06-10 NOTE — Patient Instructions (Addendum)
Get shingrix at your pharmacy if available  Please stop by lab before you go  I will ask Dr. Martinique about the amlodipine for blood pressure but I want to get you <150/90 at least and prefe r<140/90 BP Readings from Last 3 Encounters:  06/10/17 (!) 158/68  06/02/17 (!) 150/62  05/26/17 134/90   Sent some more cough medicines in for you. Renewed your albuterol inhaler as well

## 2017-06-10 NOTE — Assessment & Plan Note (Signed)
S: poorly controlled on losartan 50mg  BID, metoprolol 25mg  BID. Has had orthostatic symptoms in the past. .  BP Readings from Last 3 Encounters:  06/10/17 (!) 158/68  06/02/17 (!) 150/62  05/26/17 134/90  A/P: We discussed blood pressure goal of <140/90 ideal due to CAD but at least <150/90. Continue current meds:  Wanted to add amlodipne 2.5mg  and follow up 1 month but he requests I check with Dr. Martinique first

## 2017-06-10 NOTE — Assessment & Plan Note (Signed)
S: taking reglan and protonix (appears to be BID)- by Dr. Erlene Quan A/P: given this is through GI advice- will not down titrate from BID

## 2017-06-10 NOTE — Progress Notes (Signed)
Roselyn Reef- Dr. Martinique is ok with adding amlodipine 2.5mg . I sent this in. Can you inform patient to take this each night and follow up with me in 3 weeks? Have him let us know if he is having lightheadedness after starting this

## 2017-06-10 NOTE — Telephone Encounter (Signed)
-----   Message from Marin Olp, MD sent at 06/10/2017 12:39 PM EDT -----   ----- Message ----- From: Martinique, Peter M, MD Sent: 06/10/2017   9:48 AM To: Marin Olp, MD  OK to give it a shot but observe closely for orthostasis. Thanks  Collier Salina ----- Message ----- From: Marin Olp, MD Sent: 06/10/2017   9:25 AM To: Peter M Martinique, MD  Dr. Martinique,  BP has been running above goals. I know he has orthostatic issues. I wanted to trial amlodipine 2.5mg  to at least get him less than 150/90 but he wants me to check with you first. He denies increase in salt or decrease in exercise BP Readings from Last 3 Encounters: 06/10/17 : (!) 158/68 06/02/17 : (!) 150/62 05/26/17 : 134/90  I will start this amlodipine 2.5mg  if ok with you but please just let me know your opinion, Garret Reddish

## 2017-06-10 NOTE — Assessment & Plan Note (Signed)
S: minimal nonobstructive-  cannot take aspirin due to prior GI surgery (removal of 1/3 of stomach). Follows with Dr. Martinique. palpitatiosn improved on metoprolol A/P: definitely want to keep BP at least under 150 but prefer under 140 given his even minimally obstructive CAD. Cannot take aspirin. Likely to use statin if LDL >100 as tolerated statin around surgery last year.

## 2017-06-10 NOTE — Telephone Encounter (Signed)
Called patient and advised him regarding the amlodipine. He verbalized understanding. I also scheduled him for follow up in 3 weeks

## 2017-06-10 NOTE — Addendum Note (Signed)
Addended by: Marin Olp on: 06/10/2017 12:38 PM   Modules accepted: Orders

## 2017-06-10 NOTE — Assessment & Plan Note (Signed)
S: prior poorly controlled - was not on statin. Did short term statin for surgery. No myalgias on it  A/P: update lipids today. Although CAD minimal we agreed to restart 10mg  atovastatin if LDL over 100, prefer under 70 but will target 100

## 2017-06-10 NOTE — Progress Notes (Signed)
Phone: [phone]  Subjective:  Patient presents today for their annual physical. Chief complaint-noted.   See problem oriented charting- ROS- full  review of systems was completed and negative including No chest pain or shortness of breath. No headache. Vision issues monitored by optho with macular degeneration. No edema.   The following were reviewed and entered/updated in epic: Past Medical History:  Diagnosis Date  . Abnormal stress test December 2013   s/p cardiac cath with minimal nonobstructive CAD and normal LV function; medical management recommended  . Arthritis   . CAD (coronary artery disease)   . Dyslipidemia   . Dysrhythmia   . GERD (gastroesophageal reflux disease)   . History of hiatal hernia   . History of kidney stones   . Hypertension   . Macular degeneration   . Melanoma ([place])   . Obstructive sleep apnea 09/23/2007   Wore cpap previously. Stopped and not interested despite risks.     . OSA (obstructive sleep apnea)   . PAC (premature atrial contraction) 10/25/2015  . PVC's (premature ventricular contractions) 09/27/2013  . SVT (supraventricular tachycardia) ([place])   . Syncope    Patient Active Problem List   Diagnosis Date Noted  . CAD (coronary artery disease)     Priority: High  . Macular degeneration 11/17/2011    Priority: High  . Hyperlipidemia 06/10/2016    Priority: Medium  . BPH (benign prostatic hyperplasia) 11/23/2014    Priority: Medium  . Palpitations 07/31/2014    Priority: Medium  . Dyspnea 02/27/2014    Priority: Medium  . History of melanoma- arm 09/26/2007    Priority: Medium  . Essential hypertension 09/23/2007    Priority: Medium  . 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 Cough 05/10/2014    Priority: Low  . Pulmonary nodule 05/10/2014    Priority: Low  . Murmur 02/10/2012    Priority: Low  . Nephrolithiasis 09/04/2011    Priority: Low  . GERD 09/23/2007    Priority: Low  . Left inguinal hernia  07/12/2016  . PAC (premature atrial contraction) 10/25/2015   Past Surgical History:  Procedure Laterality Date  . APPENDECTOMY    . CARDIAC CATHETERIZATION  December 2013   minimal nonobstructive CAD with normal LV function  . CATARACT EXTRACTION    . CYSTOSCOPY WITH LITHOLAPAXY N/A 07/13/2016   Procedure: CYSTOSCOPY WITH LITHOLAPAXY;  Surgeon: Franchot Gallo, MD;  Location: [facility];  Service: Urology;  Laterality: N/A;  . CYSTOSCOPY/RETROGRADE/URETEROSCOPY/STONE EXTRACTION WITH BASKET  02/24/2012   Procedure: CYSTOSCOPY/RETROGRADE/URETEROSCOPY/STONE EXTRACTION WITH BASKET;  Surgeon: Franchot Gallo, MD;  Location: [facility];  Service: Urology;  Laterality: Bilateral;  1 HOUR  . GASTRECTOMY    . HEMORRHOID SURGERY    . HERNIA REPAIR  07/13/2016  . INGUINAL HERNIA REPAIR Left 07/13/2016   Procedure: REPAIR LEFT INGUINAL HERNIA;  Surgeon: Armandina Gemma, MD;  Location: [facility];  Service: General;  Laterality: Left;  . INSERTION OF MESH Left 07/13/2016   Procedure: INSERTION OF MESH;  Surgeon: Armandina Gemma, MD;  Location: [facility];  Service: General;  Laterality: Left;  Marland Kitchen MELANOMA EXCISION  05-05-2007   LEFT FOREARM  . NOSE SURGERY    . TONSILLECTOMY      Family History  Problem Relation Age of Onset  . Heart disease Mother        CABG in her 66s  . Pneumonia Father   . Cancer Sister        breast  .  Lung cancer Brother        smoker    Medications- reviewed and updated Current Outpatient Prescriptions  Medication Sig Dispense Refill  . acetaminophen (TYLENOL) 500 MG tablet Take 1,000 mg by mouth every 6 (six) hours as needed. PAIN      . albuterol (PROAIR HFA) 108 (90 BASE) MCG/ACT inhaler Inhale 2 puffs into the lungs every 6 (six) hours as needed for wheezing or shortness of breath. 1 Inhaler 3  . atorvastatin (LIPITOR) 10 MG tablet Take 1 tablet (10 mg total) by mouth daily. 90 tablet 3  . bevacizumab (AVASTIN) 2.5 mg/0.1 mL SOLN 2.5 mg. Injection to eye every  8 weeks - use with vigamox    . HYDROcodone-acetaminophen (NORCO/VICODIN) 5-325 MG tablet Take 1-2 tablets by mouth every 4 (four) hours as needed for moderate pain. 20 tablet 0  . ketorolac (ACULAR) 0.5 % ophthalmic solution INSTILL 1 DROP INTO RIGHT EYE 4 TIMES A DAY STARTING 72 HOURS PRIOR TO SURGERY  0  . losartan (COZAAR) 100 MG tablet Take 0.5 tablets (50 mg total) by mouth 2 (two) times daily. 90 tablet 3  . LOTEMAX 0.5 % GEL instill 1 drop into right eye three times a day for 5 days  0  . metoCLOPramide (REGLAN) 10 MG tablet Take 10 mg by mouth 2 (two) times daily.  0  . metoprolol tartrate (LOPRESSOR) 25 MG tablet Take 1 tablet (25 mg total) by mouth 2 (two) times daily. 180 tablet 2  . ofloxacin (OCUFLOX) 0.3 % ophthalmic solution INSTILL 1 DROP INTO BOTH EYES 4 TIMES A DAY STARTING 72 HOURS PRIOR TO SURGERY  0  . pantoprazole (PROTONIX) 40 MG tablet Take 40 mg by mouth 2 (two) times daily.     . phenazopyridine (PYRIDIUM) 200 MG tablet Take 1 tablet (200 mg total) by mouth 3 (three) times daily. 6 tablet 0  . Polyethyl Glycol-Propyl Glycol (SYSTANE ULTRA) 0.4-0.3 % SOLN Place 1 drop into both eyes 3 (three) times daily.     . prednisoLONE acetate (PRED FORTE) 1 % ophthalmic suspension   0  . tamsulosin (FLOMAX) 0.4 MG CAPS capsule Take 0.4 mg by mouth as needed.     . tobramycin-dexamethasone (TOBRADEX) ophthalmic solution     . VIGAMOX 0.5 % ophthalmic solution Take every 8 weeks as directed - use with avastin    . benzonatate (TESSALON) 100 MG capsule take 1 capsule by mouth twice a day if needed for cough (Patient not taking: Reported on 06/10/2017) 20 capsule 0   No current facility-administered medications for this visit.    See updated med list  Allergies-reviewed and updated Allergies  Allergen Reactions  . Aspirin Other (See Comments)    UPSET STOMACH  . Percocet [Oxycodone-Acetaminophen] Nausea And Vomiting  . Vitamins [Apatate] Other (See Comments)    Bothers stomach      Social History   Social History  . Marital status: Widowed    Spouse name: N/A  . Number of children: 1  . Years of education: N/A   Social History Main Topics  . Smoking status: Former Smoker    Packs/day: 0.10    Years: 4.00    Types: Cigarettes    Quit date: 09/03/1956  . Smokeless tobacco: Never Used  . Alcohol use No  . Drug use: No  . Sexual activity: Yes   Other Topics Concern  . 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       Hobbies: ride motorcycle (slingshot 3 year), rabbit and dear hunt    Objective: BP (!) 158/68 (BP Location: Left Arm, Patient Position: Sitting, Cuff Size: Large)   Pulse 73   Temp 97.9 F (36.6 C) (Oral)   Ht 5' 11.5" (1.816 m)   Wt 185 lb 6.4 oz (84.1 kg)   SpO2 96%   BMI 25.50 kg/m  Gen: NAD, resting comfortably HEENT: Mucous membranes are moist. Oropharynx normal Neck: no thyromegaly CV: regular rate at times then periods of ectopic beats. no murmurs (noted today)  rubs or gallops Lungs: CTAB no crackles, wheeze, rhonchi Abdomen: soft/nontender/nondistended/normal bowel sounds. No rebound or guarding.  Ext: no edema, 2+ DP pulses Skin: warm, dry Neuro: grossly normal, moves all extremities, PERRLA  Assessment/Plan:  82 y.o. male presenting for Health Maintenance counseling (physicals not covered as on medicare) 1. Anticipatory guidance: Patient counseled regarding regular dental exams - dentures, eye exams - q6 weeks, wearing seatbelts.  2. Risk factor reduction:  Advised patient of need for regular exercise and diet rich and fruits and vegetables to reduce risk of heart attack and stroke. Exercise- walking 3x a week and doing some leg exercises. Diet-weight stable- no clear increase in salt.  Wt Readings from Last 3 Encounters:  06/10/17 185 lb 6.4 oz (84.1 kg)   06/02/17 188 lb (85.3 kg)  05/26/17 184 lb 8 oz (83.7 kg)  3. Immunizations/screenings/ancillary studies- discussed shingrx at pharmacy option - he wants to do this Immunization History  Administered Date(s) Administered  . Influenza Split 09/04/2011, 08/19/2012  . Influenza Whole 08/31/2007, 09/16/2008, 10/07/2009, 10/01/2010  . Influenza, High Dose Seasonal PF 08/25/2013, 09/24/2015, 08/27/2016  . Influenza,inj,Quad PF,36+ Mos 09/05/2014  . Pneumococcal Conjugate-13 11/23/2014  . Pneumococcal Polysaccharide-23 10/16/2002  . Td 03/04/1998, 10/09/2008  . Zoster 04/28/2011  4. Prostate cancer screening- not indicated due to age. On flomax for bph and sees urolgoy  5. Colon cancer screening - Dr. Erlene Quan in [place] in 2015. As age 79 - no more planned 6. Skin cancer screening- advised regular sunscreen use. Melanoma on arm history- sees Dr. Delman Cheadle q6 months.   Status of chronic or acute concerns   Hyperlipidemia S: prior poorly controlled - was not on statin. Did short term statin for surgery. No myalgias on it  A/P: update lipids today. Although CAD minimal we agreed to restart 10mg  atovastatin if LDL over 100, prefer under 70 but will target 100  CAD (coronary artery disease) S: minimal nonobstructive-  cannot take aspirin due to prior GI surgery (removal of 1/3 of stomach). Follows with Dr. Martinique. palpitatiosn improved on metoprolol A/P: definitely want to keep BP at least under 150 but prefer under 140 given his even minimally obstructive CAD. Cannot take aspirin. Likely to use statin if LDL >100 as tolerated statin around surgery last year.   BPH (benign prostatic hyperplasia) S: BPH on flomax as needed. Has had kidney stones and sees Dr. Diona Fanti. Sees him once a year  A/P: continue flomax and urology follow up  Essential hypertension S: poorly controlled on losartan 50mg  BID, metoprolol 25mg  BID. Has had orthostatic symptoms in the past. .  BP Readings from Last 3  Encounters:  06/10/17 (!) 158/68  06/02/17 (!) 150/62  05/26/17 134/90  A/P: We discussed blood pressure goal of <140/90 ideal due to CAD but at  least <150/90. Continue current meds:  Wanted to add amlodipne 2.5mg  and follow up 1 month but he requests I check with Dr. Martinique first  GERD S: taking reglan and protonix (appears to be BID)- by Dr. Erlene Quan A/P: given this is through GI advice- will not down titrate from BID  Asks for a few more tessalon as cough much improved but lingering  Return in about 6 months (around 12/11/2017) for follow up- or sooner if needed.  Orders Placed This Encounter  Procedures  . CBC    [place]  . Comprehensive metabolic panel    [place]    Order Specific Question:   Has the patient fasted?    Answer:   No  . Lipid panel        Order Specific Question:   Has the patient fasted?    Answer:   No   Return precautions advised.  Garret Reddish, MD

## 2017-06-10 NOTE — Assessment & Plan Note (Signed)
S: BPH on flomax as needed. Has had kidney stones and sees Dr. Diona Fanti. Sees him once a year  A/P: continue flomax and urology follow up

## 2017-07-01 ENCOUNTER — Encounter: Payer: Self-pay | Admitting: Family Medicine

## 2017-07-01 ENCOUNTER — Ambulatory Visit (INDEPENDENT_AMBULATORY_CARE_PROVIDER_SITE_OTHER): Payer: Medicare Other | Admitting: Family Medicine

## 2017-07-01 VITALS — BP 134/72 | HR 68 | Temp 98.6°F | Ht 71.5 in | Wt 184.2 lb

## 2017-07-01 DIAGNOSIS — E785 Hyperlipidemia, unspecified: Secondary | ICD-10-CM

## 2017-07-01 DIAGNOSIS — I2510 Atherosclerotic heart disease of native coronary artery without angina pectoris: Secondary | ICD-10-CM | POA: Diagnosis not present

## 2017-07-01 DIAGNOSIS — I10 Essential (primary) hypertension: Secondary | ICD-10-CM

## 2017-07-01 NOTE — Assessment & Plan Note (Signed)
S: Short term statin around surgery in past. LDL <100 and triglycerdies <2000 05/2017 so remain off Lab Results  Component Value Date   CHOL 171 06/10/2017   HDL 34.00 (L) 06/10/2017   LDLCALC 99 06/10/2017   TRIG 194.0 (H) 06/10/2017   CHOLHDL 5 06/10/2017   A/P: I would not advise statin at this time for primary prevention based on his numbers. In addition-at his age unclear that benefits would outweigh the risks

## 2017-07-01 NOTE — Assessment & Plan Note (Signed)
S: controlled on losartan 50mg  BID, metoprolol 25mg  BID, amlodipine 2.5mg . History of orthostatic issues. Had minimal lightheadedness 2 days ago but only time in 3 weeks. Home blood presures range from 113-136/58-72 BP Readings from Last 3 Encounters:  07/01/17 134/72  06/10/17 (!) 158/68  06/02/17 (!) 150/62  A/P: We discussed blood pressure goal of <140/90. Continue current meds:  Much improved.

## 2017-07-01 NOTE — Progress Notes (Signed)
Subjective:  Joseph Simpson is a 82 y.o. year old very pleasant male patient who presents for/with See problem oriented charting ROS- No chest pain or shortness of breath. No headache or blurry vision.    Past Medical History-  Patient Active Problem List   Diagnosis Date Noted  . CAD (coronary artery disease)     Priority: High  . Macular degeneration 11/17/2011    Priority: High  . Hyperlipidemia 06/10/2016    Priority: Medium  . BPH (benign prostatic hyperplasia) 11/23/2014    Priority: Medium  . Palpitations 07/31/2014    Priority: Medium  . Dyspnea 02/27/2014    Priority: Medium  . History of melanoma- arm 09/26/2007    Priority: Medium  . Essential hypertension 09/23/2007    Priority: Medium  . 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 Cough 05/10/2014    Priority: Low  . Pulmonary nodule 05/10/2014    Priority: Low  . Murmur 02/10/2012    Priority: Low  . Nephrolithiasis 09/04/2011    Priority: Low  . GERD 09/23/2007    Priority: Low  . Left inguinal hernia 07/12/2016  . PAC (premature atrial contraction) 10/25/2015    Medications- reviewed and updated Current Outpatient Prescriptions  Medication Sig Dispense Refill  . acetaminophen (TYLENOL) 500 MG tablet Take 1,000 mg by mouth every 6 (six) hours as needed. PAIN      . albuterol (PROAIR HFA) 108 (90 Base) MCG/ACT inhaler Inhale 2 puffs into the lungs every 6 (six) hours as needed for wheezing or shortness of breath. 1 Inhaler 3  . amLODipine (NORVASC) 2.5 MG tablet Take 1 tablet (2.5 mg total) by mouth daily. 90 tablet 3  . bevacizumab (AVASTIN) 2.5 mg/0.1 mL SOLN 2.5 mg. Injection to eye every 8 weeks - use with vigamox    . losartan (COZAAR) 100 MG tablet Take 0.5 tablets (50 mg total) by mouth 2 (two) times daily. 90 tablet 3  . metoCLOPramide (REGLAN) 10 MG tablet TK 1/2 T PO BID  3  . metoprolol tartrate (LOPRESSOR) 25 MG tablet Take 1 tablet (25 mg total) by mouth 2 (two) times  daily. 180 tablet 2  . pantoprazole (PROTONIX) 40 MG tablet Take 40 mg by mouth 2 (two) times daily.     [name] Glycol-Propyl Glycol (SYSTANE ULTRA) 0.4-0.3 % SOLN Place 1 drop into both eyes 3 (three) times daily.     . tamsulosin (FLOMAX) 0.4 MG CAPS capsule Take 0.4 mg by mouth as needed.     Marland Kitchen VIGAMOX 0.5 % ophthalmic solution Take every 8 weeks as directed - use with avastin    . benzonatate (TESSALON) 100 MG capsule take 1 capsule by mouth twice a day if needed for cough (Patient not taking: Reported on 07/01/2017) 20 capsule 0   No current facility-administered medications for this visit.     Objective: BP 134/72 (BP Location: Left Arm, Patient Position: Sitting, Cuff Size: Large)   Pulse 68   Temp 98.6 F (37 C) (Oral)   Ht 5' 11.5" (1.816 m)   Wt 184 lb 3.2 oz (83.6 kg)   SpO2 95%   BMI 25.33 kg/m  Gen: NAD, resting comfortably CV: RRR no murmurs rubs or gallops Lungs: CTAB no crackles, wheeze, rhonchi Abdomen: soft/nontender/nondistended/normal bowel sounds. Normal weight Ext: no edema Skin: warm, dry  Assessment/Plan:  Essential hypertension S: controlled on losartan 50mg  BID, metoprolol 25mg  BID, amlodipine 2.5mg . History of orthostatic issues. Had minimal lightheadedness 2  days ago but only time in 3 weeks. Home blood presures range from 113-136/58-72 BP Readings from Last 3 Encounters:  07/01/17 134/72  06/10/17 (!) 158/68  06/02/17 (!) 150/62  A/P: We discussed blood pressure goal of <140/90. Continue current meds:  Much improved.  Hyperlipidemia S: Short term statin around surgery in past. LDL <100 and triglycerdies <2000 05/2017 so remain off Lab Results  Component Value Date   CHOL 171 06/10/2017   HDL 34.00 (L) 06/10/2017   LDLCALC 99 06/10/2017   TRIG 194.0 (H) 06/10/2017   CHOLHDL 5 06/10/2017   A/P: I would not advise statin at this time for primary prevention based on his numbers. In addition-at his age unclear that benefits would outweigh the  risks   Return in about 6 months (around 01/01/2018) for follow up- or sooner if needed.  Meds ordered this encounter  Medications  . metoCLOPramide (REGLAN) 10 MG tablet    Sig: TK 1/2 T PO BID    Refill:  3    Return precautions advised.  Garret Reddish, MD

## 2017-07-01 NOTE — Patient Instructions (Signed)
Blood pressure looks much better- continue current medicines

## 2017-07-20 ENCOUNTER — Encounter (INDEPENDENT_AMBULATORY_CARE_PROVIDER_SITE_OTHER): Payer: Medicare Other | Admitting: Ophthalmology

## 2017-07-20 DIAGNOSIS — H33301 Unspecified retinal break, right eye: Secondary | ICD-10-CM

## 2017-07-20 DIAGNOSIS — H353231 Exudative age-related macular degeneration, bilateral, with active choroidal neovascularization: Secondary | ICD-10-CM | POA: Diagnosis not present

## 2017-07-20 DIAGNOSIS — I10 Essential (primary) hypertension: Secondary | ICD-10-CM | POA: Diagnosis not present

## 2017-07-20 DIAGNOSIS — H43813 Vitreous degeneration, bilateral: Secondary | ICD-10-CM | POA: Diagnosis not present

## 2017-07-20 DIAGNOSIS — D3132 Benign neoplasm of left choroid: Secondary | ICD-10-CM | POA: Diagnosis not present

## 2017-07-20 DIAGNOSIS — H35033 Hypertensive retinopathy, bilateral: Secondary | ICD-10-CM | POA: Diagnosis not present

## 2017-08-31 ENCOUNTER — Encounter (INDEPENDENT_AMBULATORY_CARE_PROVIDER_SITE_OTHER): Payer: Medicare Other | Admitting: Ophthalmology

## 2017-08-31 DIAGNOSIS — H33301 Unspecified retinal break, right eye: Secondary | ICD-10-CM

## 2017-08-31 DIAGNOSIS — I10 Essential (primary) hypertension: Secondary | ICD-10-CM | POA: Diagnosis not present

## 2017-08-31 DIAGNOSIS — H35033 Hypertensive retinopathy, bilateral: Secondary | ICD-10-CM | POA: Diagnosis not present

## 2017-08-31 DIAGNOSIS — H43813 Vitreous degeneration, bilateral: Secondary | ICD-10-CM

## 2017-08-31 DIAGNOSIS — D3132 Benign neoplasm of left choroid: Secondary | ICD-10-CM | POA: Diagnosis not present

## 2017-08-31 DIAGNOSIS — H353231 Exudative age-related macular degeneration, bilateral, with active choroidal neovascularization: Secondary | ICD-10-CM

## 2017-09-01 ENCOUNTER — Ambulatory Visit (INDEPENDENT_AMBULATORY_CARE_PROVIDER_SITE_OTHER): Payer: Medicare Other | Admitting: *Deleted

## 2017-09-01 DIAGNOSIS — Z23 Encounter for immunization: Secondary | ICD-10-CM | POA: Diagnosis not present

## 2017-10-12 ENCOUNTER — Encounter (INDEPENDENT_AMBULATORY_CARE_PROVIDER_SITE_OTHER): Payer: Medicare Other | Admitting: Ophthalmology

## 2017-10-12 DIAGNOSIS — H35033 Hypertensive retinopathy, bilateral: Secondary | ICD-10-CM

## 2017-10-12 DIAGNOSIS — I10 Essential (primary) hypertension: Secondary | ICD-10-CM

## 2017-10-12 DIAGNOSIS — H33301 Unspecified retinal break, right eye: Secondary | ICD-10-CM

## 2017-10-12 DIAGNOSIS — H43813 Vitreous degeneration, bilateral: Secondary | ICD-10-CM

## 2017-10-12 DIAGNOSIS — H353231 Exudative age-related macular degeneration, bilateral, with active choroidal neovascularization: Secondary | ICD-10-CM | POA: Diagnosis not present

## 2017-10-12 DIAGNOSIS — D3132 Benign neoplasm of left choroid: Secondary | ICD-10-CM | POA: Diagnosis not present

## 2017-10-25 ENCOUNTER — Other Ambulatory Visit: Payer: Self-pay | Admitting: Cardiology

## 2017-10-28 DIAGNOSIS — D2230 Melanocytic nevi of unspecified part of face: Secondary | ICD-10-CM | POA: Diagnosis not present

## 2017-10-28 DIAGNOSIS — D1800 Hemangioma unspecified site: Secondary | ICD-10-CM | POA: Diagnosis not present

## 2017-10-28 DIAGNOSIS — D485 Neoplasm of uncertain behavior of skin: Secondary | ICD-10-CM | POA: Diagnosis not present

## 2017-10-28 DIAGNOSIS — C44519 Basal cell carcinoma of skin of other part of trunk: Secondary | ICD-10-CM | POA: Diagnosis not present

## 2017-10-28 DIAGNOSIS — Z23 Encounter for immunization: Secondary | ICD-10-CM | POA: Diagnosis not present

## 2017-10-28 DIAGNOSIS — L308 Other specified dermatitis: Secondary | ICD-10-CM | POA: Diagnosis not present

## 2017-10-28 DIAGNOSIS — Z86008 Personal history of in-situ neoplasm of other site: Secondary | ICD-10-CM | POA: Diagnosis not present

## 2017-10-28 DIAGNOSIS — L821 Other seborrheic keratosis: Secondary | ICD-10-CM | POA: Diagnosis not present

## 2017-10-28 DIAGNOSIS — Z85828 Personal history of other malignant neoplasm of skin: Secondary | ICD-10-CM | POA: Diagnosis not present

## 2017-10-28 DIAGNOSIS — L570 Actinic keratosis: Secondary | ICD-10-CM | POA: Diagnosis not present

## 2017-11-01 DIAGNOSIS — N401 Enlarged prostate with lower urinary tract symptoms: Secondary | ICD-10-CM | POA: Diagnosis not present

## 2017-11-01 DIAGNOSIS — N210 Calculus in bladder: Secondary | ICD-10-CM | POA: Diagnosis not present

## 2017-11-01 DIAGNOSIS — R350 Frequency of micturition: Secondary | ICD-10-CM | POA: Diagnosis not present

## 2017-11-08 DIAGNOSIS — R55 Syncope and collapse: Secondary | ICD-10-CM | POA: Insufficient documentation

## 2017-11-08 DIAGNOSIS — I499 Cardiac arrhythmia, unspecified: Secondary | ICD-10-CM | POA: Insufficient documentation

## 2017-11-08 DIAGNOSIS — I4891 Unspecified atrial fibrillation: Secondary | ICD-10-CM | POA: Insufficient documentation

## 2017-11-08 DIAGNOSIS — K219 Gastro-esophageal reflux disease without esophagitis: Secondary | ICD-10-CM | POA: Insufficient documentation

## 2017-11-08 DIAGNOSIS — C439 Malignant melanoma of skin, unspecified: Secondary | ICD-10-CM | POA: Insufficient documentation

## 2017-11-08 DIAGNOSIS — M1990 Unspecified osteoarthritis, unspecified site: Secondary | ICD-10-CM | POA: Insufficient documentation

## 2017-11-08 DIAGNOSIS — Z87442 Personal history of urinary calculi: Secondary | ICD-10-CM | POA: Insufficient documentation

## 2017-11-08 DIAGNOSIS — Z8719 Personal history of other diseases of the digestive system: Secondary | ICD-10-CM | POA: Insufficient documentation

## 2017-11-08 DIAGNOSIS — G4733 Obstructive sleep apnea (adult) (pediatric): Secondary | ICD-10-CM | POA: Insufficient documentation

## 2017-11-08 DIAGNOSIS — I471 Supraventricular tachycardia: Secondary | ICD-10-CM | POA: Insufficient documentation

## 2017-11-08 DIAGNOSIS — I10 Essential (primary) hypertension: Secondary | ICD-10-CM | POA: Insufficient documentation

## 2017-11-08 DIAGNOSIS — E785 Hyperlipidemia, unspecified: Secondary | ICD-10-CM | POA: Insufficient documentation

## 2017-11-08 DIAGNOSIS — I480 Paroxysmal atrial fibrillation: Secondary | ICD-10-CM | POA: Insufficient documentation

## 2017-11-10 NOTE — Progress Notes (Signed)
Cardiology Office Note    Date:  11/11/2017   ID:  Joseph Simpson, DOB Feb 15, 1935, MRN [id number]  PCP:  Marin Olp, MD  Cardiologist: Dr. Martinique  Chief Complaint  Patient presents with  . Follow-up    12 month visit    History of Present Illness:    Joseph Simpson is a 82 y.o. male with past medical history of chest pain (abnormal NST in 2013 with cath showing nonobstructive CAD), PVC's, HTN, and presyncope who presents to the office today for his annual visit.    He was last examined by Dr. Martinique in 11/02/2016 and denied any recent chest discomfort or dyspnea on exertion. Was walking approximately 30 minutes per day without any anginal symptoms. BP was elevated at 150/77 during his visit but he was continued on his current medication regimen at that time given his history of presyncope.  In talking with the patient today, he reports overall doing well since his last office visit. He denies any recent chest pain, dyspnea on exertion, orthopnea, PND, or lower extremity edema. Has been deer hunting multiple times this winter and denies any exertional symptoms with prolonged walking.   Does have occasional palpitations which occur when he "over exerts" himself. His last episode of persistent palpitations was 5-6 months ago when he had been outside in the heat spraying weeds. Denies any associated lightheadedness, dizziness, or presyncope at that time. Has experienced episodes of his "heart skipping" since but this usually only lasts for seconds at a time.   He does not check BP regularly but it is well-controlled at 132/60 during today's visit.    Past Medical History:  Diagnosis Date  . Abnormal stress test December 2013   s/p cardiac cath with minimal nonobstructive CAD and normal LV function; medical management recommended  . Arthritis   . CAD (coronary artery disease)   . Dyslipidemia   . Dysrhythmia   . GERD (gastroesophageal reflux disease)   . History of hiatal  hernia   . History of kidney stones   . Hypertension   . Macular degeneration   . Melanoma ([place])   . Obstructive sleep apnea 09/23/2007   Wore cpap previously. Stopped and not interested despite risks.     . OSA (obstructive sleep apnea)   . PAC (premature atrial contraction) 10/25/2015  . PVC's (premature ventricular contractions) 09/27/2013  . SVT (supraventricular tachycardia) ([place])   . Syncope     Past Surgical History:  Procedure Laterality Date  . APPENDECTOMY    . CARDIAC CATHETERIZATION  December 2013   minimal nonobstructive CAD with normal LV function  . CATARACT EXTRACTION    . CYSTOSCOPY WITH LITHOLAPAXY N/A 07/13/2016   Procedure: CYSTOSCOPY WITH LITHOLAPAXY;  Surgeon: Franchot Gallo, MD;  Location: [facility];  Service: Urology;  Laterality: N/A;  . CYSTOSCOPY/RETROGRADE/URETEROSCOPY/STONE EXTRACTION WITH BASKET  02/24/2012   Procedure: CYSTOSCOPY/RETROGRADE/URETEROSCOPY/STONE EXTRACTION WITH BASKET;  Surgeon: Franchot Gallo, MD;  Location: [facility];  Service: Urology;  Laterality: Bilateral;  1 HOUR  . GASTRECTOMY    . HEMORRHOID SURGERY    . HERNIA REPAIR  07/13/2016  . INGUINAL HERNIA REPAIR Left 07/13/2016   Procedure: REPAIR LEFT INGUINAL HERNIA;  Surgeon: Armandina Gemma, MD;  Location: [facility];  Service: General;  Laterality: Left;  . INSERTION OF MESH Left 07/13/2016   Procedure: INSERTION OF MESH;  Surgeon: Armandina Gemma, MD;  Location: [facility];  Service: General;  Laterality: Left;  Marland Kitchen MELANOMA EXCISION  05-05-2007  LEFT FOREARM  . NOSE SURGERY    . TONSILLECTOMY      Current Medications: Outpatient Medications Prior to Visit  Medication Sig Dispense Refill  . acetaminophen (TYLENOL) 500 MG tablet Take 1,000 mg by mouth every 6 (six) hours as needed. PAIN      . albuterol (PROAIR HFA) 108 (90 Base) MCG/ACT inhaler Inhale 2 puffs into the lungs every 6 (six) hours as needed for wheezing or shortness of breath. 1 Inhaler 3  . amLODipine (NORVASC)  2.5 MG tablet Take 1 tablet (2.5 mg total) by mouth daily. 90 tablet 3  . benzonatate (TESSALON) 100 MG capsule take 1 capsule by mouth twice a day if needed for cough (Patient not taking: Reported on 07/01/2017) 20 capsule 0  . bevacizumab (AVASTIN) 2.5 mg/0.1 mL SOLN 2.5 mg. Injection to eye every 8 weeks - use with vigamox    . losartan (COZAAR) 100 MG tablet Take 0.5 tablets (50 mg total) by mouth 2 (two) times daily. 90 tablet 3  . metoCLOPramide (REGLAN) 10 MG tablet TK 1/2 T PO BID  3  . pantoprazole (PROTONIX) 40 MG tablet Take 40 mg by mouth 2 (two) times daily.     [name] Glycol-Propyl Glycol (SYSTANE ULTRA) 0.4-0.3 % SOLN Place 1 drop into both eyes 3 (three) times daily.     . tamsulosin (FLOMAX) 0.4 MG CAPS capsule Take 0.4 mg by mouth as needed.     Marland Kitchen VIGAMOX 0.5 % ophthalmic solution Take every 8 weeks as directed - use with avastin    . metoprolol tartrate (LOPRESSOR) 25 MG tablet TAKE ONE TABLET BY MOUTH TWICE DAILY  180 tablet 0   No facility-administered medications prior to visit.      Allergies:   Aspirin; Percocet [oxycodone-acetaminophen]; and Vitamins [apatate]   Social History   Socioeconomic History  . Marital status: Widowed    Spouse name: None  . Number of children: 1  . Years of education: None  . Highest education level: None  Social Needs  . Financial resource strain: None  . Food insecurity - worry: None  . Food insecurity - inability: None  . Transportation needs - medical: None  . Transportation needs - non-medical: None  Occupational History  . None  Tobacco Use  . Smoking status: Former Smoker    Packs/day: 0.10    Years: 4.00    Pack years: 0.40    Types: Cigarettes    Last attempt to quit: 09/03/1956    Years since quitting: 61.2  . Smokeless tobacco: Never Used  Substance and Sexual Activity  . Alcohol use: No    Alcohol/week: 0.0 oz  . Drug use: No  . Sexual activity: Yes  Other Topics Concern  . 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       Hobbies: ride motorcycle (slingshot 3 year), rabbit and dear hunt     Family History:  The patient's family history includes Cancer in his sister; Heart disease in his mother; Lung cancer in his brother; Pneumonia in his father.   Review of Systems:   Please see the history of present illness.     General:  No chills, fever, night sweats or weight changes.  Cardiovascular:  No chest pain, dyspnea on exertion, edema, orthopnea, paroxysmal nocturnal dyspnea.  Positive for palpitations.  Dermatological: No rash, lesions/masses Respiratory: No cough, dyspnea Urologic: No hematuria, dysuria Abdominal:   No nausea, vomiting, diarrhea, bright red blood per rectum, melena, or hematemesis Neurologic:  No visual changes, wkns, changes in mental status.  All other systems reviewed and are otherwise negative except as noted above.   Physical Exam:    VS:  BP 132/60   Pulse 63   Ht 6\' 1"  (1.854 m)   Wt 185 lb (83.9 kg)   BMI 24.41 kg/m    General: Well developed, well nourished Caucasian male appearing in no acute distress. Head: Normocephalic, atraumatic, sclera non-icteric, no xanthomas, nares are without discharge.  Neck: No carotid bruits. JVD not elevated.  Lungs: Respirations regular and unlabored, without wheezes or rales.  Heart: Regular rate and rhythm with occasional ectopic beats. No S3 or S4.  No murmur, no rubs, or gallops appreciated. Abdomen: Soft, non-tender, non-distended with normoactive bowel sounds. No hepatomegaly. No rebound/guarding. No obvious abdominal masses. Msk:  Strength and tone appear normal for age. No joint deformities or effusions. Extremities: No clubbing or cyanosis. No lower extremity edema.  Distal pedal pulses are 2+ bilaterally. Neuro: Alert and oriented X 3. Moves  all extremities spontaneously. No focal deficits noted. Psych:  Responds to questions appropriately with a normal affect. Skin: No rashes or lesions noted  Wt Readings from Last 3 Encounters:  11/11/17 185 lb (83.9 kg)  07/01/17 184 lb 3.2 oz (83.6 kg)  06/10/17 185 lb 6.4 oz (84.1 kg)     Studies/Labs Reviewed:   EKG:  EKG is ordered today. The ekg ordered today demonstrates NSR, HR 63, with no acute ST or T-wave changes.   Recent Labs: 06/10/2017: ALT 17; BUN 10; Creatinine, Ser 0.83; Hemoglobin 15.9; Platelets 167.0; Potassium 4.1; Sodium 139   Lipid Panel    Component Value Date/Time   CHOL 171 06/10/2017 0928   TRIG 194.0 (H) 06/10/2017 0928   HDL 34.00 (L) 06/10/2017 0928   CHOLHDL 5 06/10/2017 0928   VLDL 38.8 06/10/2017 0928   LDLCALC 99 06/10/2017 0928    Additional studies/ records that were reviewed today include:   Cardiac Catheterization: 10/2012 Coronary angiography: Coronary dominance: right  Left mainstem: Normal.  Left anterior descending (LAD): The LAD is rest a large first septal perforator and a moderate sized branching diagonal. The LAD in the mid to distal vessel is small in caliber without any focal stenoses. There are moderate t/then 10-20%.  Left circumflex (LCx): The left circumflex also has mild irregularities less than 10-20%. This is a codominant vessel.  Right coronary artery (RCA): The right coronary is without significant disease.  Left ventriculography: Left ventricular systolic function is normal, LVEF is estimated at 55-65%, there is no significant mitral regurgitation   Final Conclusions:   1. Minimal nonobstructive coronary disease. 2. Normal left ventricular function.  Recommendations: Medical therapy with focus on blood pressure control.  Assessment:    1. Coronary artery disease involving native coronary artery of native heart without angina pectoris   2. Heart palpitations   3. PAC (premature atrial contraction)     4. Essential hypertension      Plan:   In order of problems listed above:  1. CAD - patient had an abnormal NST in 2013 with cath at that time showing nonobstructive CAD.  - he denies any recent chest pain or dyspnea on exertion. EKG today shows no acute ischemic changes. - no indication for further ischemic evaluation at this time.  2. Palpitations/ PAC's - reports having occasional palpitations which occur more frequently when he "over exerts" himself. Last significant episode was 5+ months ago. Denies any associated lightheadedness, dizziness, or presyncope.  - continue Lopressor 25mg  BID. I advised him he can take an extra Lopressor tablet if needed for persistent palpitations.   3. HTN - BP is well-controlled at 132/60 during today's visit. - continue Amlodipine 2.5mg  daily, Losartan 100mg  daily, and Lopressor 25mg  BID.    Medication Adjustments/Labs and Tests Ordered: Current medicines are reviewed at length with the patient today.  Concerns regarding medicines are outlined above.  Medication changes, Labs and Tests ordered today are listed in the Patient Instructions below. Patient Instructions  Medication Instructions: You may take an extra Metoprolol (Lopressor) as needed for palpitations.  If you need a refill on your cardiac medications before your next appointment, please call your pharmacy.   Follow-Up: Your physician wants you to follow-up in: 12 months with Dr. Martinique. You will receive a reminder letter in the mail two months in advance. If you don't receive a letter, please call our office at [phone] to schedule this follow-up appointment.   Thank you for choosing Heartcare at [facility]!!      Signed, Erma Heritage, PA-C  11/11/2017 4:06 PM    [address], [place], [place]  [address] Phone: [phone]; Fax: [phone]  [address] [place], [place] [address] Phone:  [phone]

## 2017-11-11 ENCOUNTER — Ambulatory Visit (INDEPENDENT_AMBULATORY_CARE_PROVIDER_SITE_OTHER): Payer: Medicare Other | Admitting: Student

## 2017-11-11 ENCOUNTER — Ambulatory Visit: Payer: Medicare Other | Admitting: Student

## 2017-11-11 ENCOUNTER — Encounter: Payer: Self-pay | Admitting: Student

## 2017-11-11 VITALS — BP 132/60 | HR 63 | Ht 73.0 in | Wt 185.0 lb

## 2017-11-11 DIAGNOSIS — R002 Palpitations: Secondary | ICD-10-CM

## 2017-11-11 DIAGNOSIS — I10 Essential (primary) hypertension: Secondary | ICD-10-CM | POA: Diagnosis not present

## 2017-11-11 DIAGNOSIS — I491 Atrial premature depolarization: Secondary | ICD-10-CM

## 2017-11-11 DIAGNOSIS — I2510 Atherosclerotic heart disease of native coronary artery without angina pectoris: Secondary | ICD-10-CM

## 2017-11-11 MED ORDER — METOPROLOL TARTRATE 25 MG PO TABS: 25.0000 mg | ORAL_TABLET | Freq: Two times a day (BID) | ORAL | 3 refills | Status: DC

## 2017-11-11 NOTE — Patient Instructions (Signed)
Medication Instructions: You may take an extra Metoprolol (Lopressor) as needed for palpitations.  If you need a refill on your cardiac medications before your next appointment, please call your pharmacy.    Follow-Up: Your physician wants you to follow-up in: 12 months with Dr. Martinique. You will receive a reminder letter in the mail two months in advance. If you don't receive a letter, please call our office at [phone] to schedule this follow-up appointment.   Thank you for choosing Heartcare at [facility]!!

## 2017-11-15 ENCOUNTER — Ambulatory Visit: Payer: Medicare Other | Admitting: Student

## 2017-11-23 ENCOUNTER — Encounter (INDEPENDENT_AMBULATORY_CARE_PROVIDER_SITE_OTHER): Payer: Medicare Other | Admitting: Ophthalmology

## 2017-11-23 DIAGNOSIS — H35033 Hypertensive retinopathy, bilateral: Secondary | ICD-10-CM

## 2017-11-23 DIAGNOSIS — H43813 Vitreous degeneration, bilateral: Secondary | ICD-10-CM | POA: Diagnosis not present

## 2017-11-23 DIAGNOSIS — D3132 Benign neoplasm of left choroid: Secondary | ICD-10-CM

## 2017-11-23 DIAGNOSIS — I10 Essential (primary) hypertension: Secondary | ICD-10-CM

## 2017-11-23 DIAGNOSIS — H33301 Unspecified retinal break, right eye: Secondary | ICD-10-CM | POA: Diagnosis not present

## 2017-11-23 DIAGNOSIS — H353231 Exudative age-related macular degeneration, bilateral, with active choroidal neovascularization: Secondary | ICD-10-CM

## 2017-11-24 DIAGNOSIS — L570 Actinic keratosis: Secondary | ICD-10-CM | POA: Diagnosis not present

## 2017-11-24 DIAGNOSIS — L723 Sebaceous cyst: Secondary | ICD-10-CM | POA: Diagnosis not present

## 2017-11-24 DIAGNOSIS — C44519 Basal cell carcinoma of skin of other part of trunk: Secondary | ICD-10-CM | POA: Diagnosis not present

## 2017-11-26 DIAGNOSIS — M19011 Primary osteoarthritis, right shoulder: Secondary | ICD-10-CM | POA: Diagnosis not present

## 2017-11-29 ENCOUNTER — Other Ambulatory Visit: Payer: Self-pay | Admitting: Orthopaedic Surgery

## 2017-11-29 ENCOUNTER — Telehealth: Payer: Self-pay | Admitting: *Deleted

## 2017-11-29 DIAGNOSIS — M25511 Pain in right shoulder: Secondary | ICD-10-CM

## 2017-11-29 NOTE — Telephone Encounter (Signed)
   [facility] Pre-operative Risk Assessment    Request for surgical clearance:  1. What type of surgery is being performed? RIGHT TOTAL SHOULDER REPLACEMENT    2. When is this surgery scheduled? 12/29/2017   3. Are there any medications that need to be held prior to surgery and how long?   4. Practice name and name of physician performing surgery? [facility]   5. What is your office phone and fax number? PHONE [phone] FAX [phone] [place]    6. Anesthesia type (None, local, MAC, general) ? UNKNOWN

## 2017-11-30 NOTE — Telephone Encounter (Signed)
   Primary Cardiologist: No primary care provider on file.  Chart reviewed as part of pre-operative protocol coverage. Given past medical history and time since last visit, based on ACC/AHA guidelines, Joseph Simpson would be at acceptable risk for the planned procedure without further cardiovascular testing.   No changes in medication regimen or need to hold any medications perioperatively.  I will route this recommendation to the requesting party via Epic fax function and remove from pre-op pool.  Please call with questions.  Jory Sims DNP  11/30/2017, 5:08 PM

## 2017-12-01 NOTE — Telephone Encounter (Signed)
Faxed via EPIC to requesting provider 

## 2017-12-02 ENCOUNTER — Ambulatory Visit
Admission: RE | Admit: 2017-12-02 | Discharge: 2017-12-02 | Disposition: A | Discharge status: Home / Self Care | Payer: Medicare Other | Source: Ambulatory Visit | Attending: Orthopaedic Surgery | Admitting: Orthopaedic Surgery

## 2017-12-02 DIAGNOSIS — M25511 Pain in right shoulder: Secondary | ICD-10-CM

## 2017-12-02 DIAGNOSIS — M19011 Primary osteoarthritis, right shoulder: Secondary | ICD-10-CM | POA: Diagnosis not present

## 2017-12-10 ENCOUNTER — Ambulatory Visit (INDEPENDENT_AMBULATORY_CARE_PROVIDER_SITE_OTHER): Payer: Medicare Other | Admitting: Family Medicine

## 2017-12-10 ENCOUNTER — Encounter: Payer: Self-pay | Admitting: Family Medicine

## 2017-12-10 VITALS — BP 136/64 | HR 67 | Temp 98.6°F | Ht 73.0 in | Wt 184.8 lb

## 2017-12-10 DIAGNOSIS — I10 Essential (primary) hypertension: Secondary | ICD-10-CM

## 2017-12-10 DIAGNOSIS — E785 Hyperlipidemia, unspecified: Secondary | ICD-10-CM | POA: Diagnosis not present

## 2017-12-10 DIAGNOSIS — K219 Gastro-esophageal reflux disease without esophagitis: Secondary | ICD-10-CM

## 2017-12-10 DIAGNOSIS — I2510 Atherosclerotic heart disease of native coronary artery without angina pectoris: Secondary | ICD-10-CM | POA: Diagnosis not present

## 2017-12-10 MED ORDER — ATORVASTATIN CALCIUM 10 MG PO TABS: 10.0000 mg | ORAL_TABLET | Freq: Every day | ORAL | 3 refills | Status: DC

## 2017-12-10 NOTE — Patient Instructions (Addendum)
I am sending in atorvastatin 10mg  for you - given 3 month supply- would at least take this around the time of your surgery but honestly this would also lower your long term risk for heart attack or stroke so would be ok to stay on this if you tolerate it  See if they can either schedule you same day to see me that you see Cassie or Manuela Schwartz OR can schedule a few days later with me

## 2017-12-10 NOTE — Assessment & Plan Note (Signed)
S: reasonaly controlled on no statin- have used short term statin in the past around times of surgery. No myalgias.  Lab Results  Component Value Date   CHOL 171 06/10/2017   HDL 34.00 (L) 06/10/2017   LDLCALC 99 06/10/2017   TRIG 194.0 (H) 06/10/2017   CHOLHDL 5 06/10/2017   A/P: He does have minimal nonobstructive CAD- discussed we could use atorvastatin 10mg  which he has been on in the past to push LDL below 70.  "I am sending in atorvastatin 10mg  for you - given 3 month supply- would at least take this around the time of your surgery but honestly this would also lower your long term risk for heart attack or stroke so would be ok to stay on this if you tolerate it"

## 2017-12-10 NOTE — Assessment & Plan Note (Signed)
S: controlled on losartan 50mg  BID, metoprolol 25mg  BID, amlodipine 2.5mg . Has had some orthostatic hypotension in the past.   Home blood pressures mostly 130s/70s. Rarely still with some lightheadedness- had some a few days.  BP Readings from Last 3 Encounters:  12/10/17 136/64  11/11/17 132/60  07/01/17 134/72  A/P: We discussed blood pressure goal of <140/90. Continue current meds

## 2017-12-10 NOTE — Assessment & Plan Note (Signed)
S: per GI Dr. Erlene Quan- he is on BID protonix- also needs reglan 1/2 tablet BID A/P: continue current medidcations

## 2017-12-10 NOTE — Progress Notes (Signed)
Subjective:  Joseph Simpson is a 83 y.o. year old very pleasant male patient who presents for/with See problem oriented charting ROS- No chest pain or shortness of breath. No headache or blurry vision.  Occasional palpitations. Rare lightheadedness   Past Medical History-  Patient Active Problem List   Diagnosis Date Noted  . CAD (coronary artery disease)     Priority: High  . Macular degeneration 11/17/2011    Priority: High  . Hyperlipidemia 06/10/2016    Priority: Medium  . BPH (benign prostatic hyperplasia) 11/23/2014    Priority: Medium  . Palpitations 07/31/2014    Priority: Medium  . Dyspnea 02/27/2014    Priority: Medium  . History of melanoma- arm 09/26/2007    Priority: Medium  . Essential hypertension 09/23/2007    Priority: Medium  . PAC (premature atrial contraction) 10/25/2015    Priority: Low  . 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 Cough 05/10/2014    Priority: Low  . Pulmonary nodule 05/10/2014    Priority: Low  . PVC's (premature ventricular contractions) 09/27/2013    Priority: Low  . Abnormal stress test 10/16/2012    Priority: Low  . Murmur 02/10/2012    Priority: Low  . Nephrolithiasis 09/04/2011    Priority: Low  . GERD 09/23/2007    Priority: Low  . Obstructive sleep apnea 09/23/2007    Priority: Low  . Syncope   . SVT (supraventricular tachycardia) ([place])   . History of kidney stones   . History of hiatal hernia   . Arthritis   . Left inguinal hernia 07/12/2016    Medications- reviewed and updated Current Outpatient Medications  Medication Sig Dispense Refill  . acetaminophen (TYLENOL 8 HOUR ARTHRITIS PAIN) 650 MG CR tablet Take 650 mg by mouth 2 (two) times daily as needed for pain.    Marland Kitchen albuterol (PROAIR HFA) 108 (90 Base) MCG/ACT inhaler Inhale 2 puffs into the lungs every 6 (six) hours as needed for wheezing or shortness of breath. 1 Inhaler 3  . amLODipine (NORVASC) 2.5 MG tablet Take 1 tablet (2.5 mg total)  by mouth daily. (Patient taking differently: Take 2.5 mg by mouth every evening. ) 90 tablet 3  . bevacizumab (AVASTIN) 2.5 mg/0.1 mL SOLN 2.5 mg by Intravitreal route. Injection to eye every 8 weeks - use with vigamox    . losartan (COZAAR) 100 MG tablet Take 0.5 tablets (50 mg total) by mouth 2 (two) times daily. 90 tablet 3  . metoCLOPramide (REGLAN) 10 MG tablet Take 5 mg by mouth 2 (two) times daily.    . metoprolol tartrate (LOPRESSOR) 25 MG tablet Take 1 tablet (25 mg total) by mouth 2 (two) times daily. 180 tablet 3  . pantoprazole (PROTONIX) 40 MG tablet Take 40 mg by mouth 2 (two) times daily.     [name] Glycol-Propyl Glycol (SYSTANE ULTRA) 0.4-0.3 % SOLN Place 1 drop into both eyes 3 (three) times daily.     . tamsulosin (FLOMAX) 0.4 MG CAPS capsule Take 0.4 mg by mouth daily as needed (for urinary symptoms).     Marland Kitchen VIGAMOX 0.5 % ophthalmic solution Apply 1 drop to eye as directed. Take every 8 weeks as directed - use with avastin     Objective: BP 136/64 (BP Location: Left Arm, Patient Position: Sitting, Cuff Size: Large)   Pulse 67   Temp 98.6 F (37 C) (Oral)   Ht 6\' 1"  (1.854 m)   Wt 184 lb 12.8  oz (83.8 kg)   SpO2 97%   BMI 24.38 kg/m  Gen: NAD, resting comfortably CV: RRR no murmurs rubs or gallops Lungs: CTAB no crackles, wheeze, rhonchi Abdomen: soft/nontender/nondistended/normal bowel sounds.  Ext: no edema Skin: warm, dry Neuro: good strength in right lower arm and in hand (other than thumb with arthritis). To gross touch on radial side of forearm- reports mild decrease in sensation  Assessment/Plan:  Other notes 1. shingrix at pharmacy previously discussed- still has not gotten 2. Sees urology for BPH- on flomax. No more PSAs 3. Sees Dr. Delman Cheadle q6 months due to melanoma history. 2 skin cancers taken off within a month on his back.  4. Prn albuterol- likely asthma related- has not used at all since last visit 5. Upcoming shoulder surgery with Dr. Griffin Basil with  murphy/wainer 6. Right forearm very mild numbness intermittent- at times mild pain. We discussed could be cervical in origin- we discussed working this up further after shoulder surgery- unless symptoms worsen and would want to see him sooner in that case.   Regarding upcoming surgery- he is medically maximized for surgery. No exertional chest pain or shortness of breath- will also need cardiology to sign off  Hyperlipidemia S: reasonaly controlled on no statin- have used short term statin in the past around times of surgery. No myalgias.  Lab Results  Component Value Date   CHOL 171 06/10/2017   HDL 34.00 (L) 06/10/2017   LDLCALC 99 06/10/2017   TRIG 194.0 (H) 06/10/2017   CHOLHDL 5 06/10/2017   A/P: He does have minimal nonobstructive CAD- discussed we could use atorvastatin 10mg  which he has been on in the past to push LDL below 70.  "I am sending in atorvastatin 10mg  for you - given 3 month supply- would at least take this around the time of your surgery but honestly this would also lower your long term risk for heart attack or stroke so would be ok to stay on this if you tolerate it"  Essential hypertension S: controlled on losartan 50mg  BID, metoprolol 25mg  BID, amlodipine 2.5mg . Has had some orthostatic hypotension in the past.   Home blood pressures mostly 130s/70s. Rarely still with some lightheadedness- had some a few days.  BP Readings from Last 3 Encounters:  12/10/17 136/64  11/11/17 132/60  07/01/17 134/72  A/P: We discussed blood pressure goal of <140/90. Continue current meds  CAD (coronary artery disease) S: minimal nonobstructive CAD per 2013 cath- no ASA with prior 1/3 removal of stomach. Sees Dr. Morrison Old. Palpitations in the past which metoprolol helps. Saw cardiology last 11/11/17 A/P: continue current medicine- will add statin at least around surgery  GERD S: per GI Dr. Erlene Quan- he is on BID protonix- also needs reglan 1/2 tablet BID A/P: continue current  medidcations  Future Appointments  Date Time Provider [place]  12/15/2017  1:00 PM [facility] PAT 3 [facility] None  01/04/2018  7:30 AM Hayden Pedro, MD [facility] None  06/03/2018 11:00 AM Williemae Area, RN [facility]   Return in about 6 months (around 06/09/2018) for annual wellness visit with Manuela Schwartz or Cassie and see me the same day.  Meds ordered this encounter  Medications  . atorvastatin (LIPITOR) 10 MG tablet    Sig: Take 1 tablet (10 mg total) by mouth daily.    Dispense:  90 tablet    Refill:  3   Return precautions advised.  Garret Reddish, MD

## 2017-12-10 NOTE — Assessment & Plan Note (Signed)
S: minimal nonobstructive CAD per 2013 cath- no ASA with prior 1/3 removal of stomach. Sees Dr. Morrison Old. Palpitations in the past which metoprolol helps. Saw cardiology last 11/11/17 A/P: continue current medicine- will add statin at least around surgery

## 2017-12-14 NOTE — Pre-Procedure Instructions (Signed)
Joseph Simpson  12/14/2017      [facility], [place] [address] Phone: [phone] Fax: [facility] [id number], [place] [address] Phone: [phone] Fax: [phone]    Your procedure is scheduled on February 13  Report to [facility] at [place].M.  Call this number if you have problems the morning of surgery:  [phone]   Remember:  Do not eat food or drink liquids after midnight.  Continue all medications as directed by your physician except follow these medication instructions before surgery below   Take these medicines the morning of surgery with A SIP OF WATER  albuterol (PROAIR HFA) metoCLOPramide (REGLAN)  metoprolol tartrate (LOPRESSOR) pantoprazole (PROTONIX) Eye drops tamsulosin (FLOMAX) if needed  7 days prior to surgery STOP taking any Aspirin(unless otherwise instructed by your surgeon), Aleve, Naproxen, Ibuprofen, Motrin, Advil, Goody's, BC's, all herbal medications, fish oil, and all vitamins    Do not wear jewelry  Do not wear lotions, powders, or cologne, or deodorant.   Men may shave face and neck.  Do not bring valuables to the hospital.  [facility] is not responsible for any belongings or valuables.  Contacts, dentures or bridgework may not be worn into surgery.  Leave your suitcase in the car.  After surgery it may be brought to your room.  For patients admitted to the hospital, discharge time will be determined by your treatment team.  Patients discharged the day of surgery will not be allowed to drive home.   Special instructions:   [employer]- Preparing For Surgery  Before surgery, you can play an important role. Because skin is not sterile, your skin needs to be as free of germs as possible. You can reduce the number of germs on your skin by  washing with CHG (chlorahexidine gluconate) Soap before surgery.  CHG is an antiseptic cleaner which kills germs and bonds with the skin to continue killing germs even after washing.  Please do not use if you have an allergy to CHG or antibacterial soaps. If your skin becomes reddened/irritated stop using the CHG.  Do not shave (including legs and underarms) for at least 48 hours prior to first CHG shower. It is OK to shave your face.  Please follow these instructions carefully.   1. Shower the NIGHT BEFORE SURGERY and the MORNING OF SURGERY with CHG.   2. If you chose to wash your hair, wash your hair first as usual with your normal shampoo.  3. After you shampoo, rinse your hair and body thoroughly to remove the shampoo.  4. Use CHG as you would any other liquid soap. You can apply CHG directly to the skin and wash gently with a scrungie or a clean washcloth.   5. Apply the CHG Soap to your body ONLY FROM THE NECK DOWN.  Do not use on open wounds or open sores. Avoid contact with your eyes, ears, mouth and genitals (private parts). Wash Face and genitals (private parts)  with your normal soap.  6. Wash thoroughly, paying special attention to the area where your surgery will be performed.  7. Thoroughly rinse your body with warm water from the neck down.  8. DO NOT shower/wash with your normal soap after using and rinsing off the CHG Soap.  9. Pat yourself dry with a  CLEAN TOWEL.  10. Wear CLEAN PAJAMAS to bed the night before surgery, wear comfortable clothes the morning of surgery  11. Place CLEAN SHEETS on your bed the night of your first shower and DO NOT SLEEP WITH PETS.    Day of Surgery: Do not apply any deodorants/lotions. Please wear clean clothes to the hospital/surgery center.      Please read over the following fact sheets that you were given.

## 2017-12-15 ENCOUNTER — Encounter (HOSPITAL_COMMUNITY): Payer: Self-pay

## 2017-12-15 ENCOUNTER — Other Ambulatory Visit: Payer: Self-pay

## 2017-12-15 ENCOUNTER — Encounter (HOSPITAL_COMMUNITY)
Admission: RE | Admit: 2017-12-15 | Discharge: 2017-12-15 | Disposition: A | Discharge status: Home / Self Care | Payer: Medicare Other | Source: Ambulatory Visit | Attending: Orthopaedic Surgery | Admitting: Orthopaedic Surgery

## 2017-12-15 DIAGNOSIS — Z01812 Encounter for preprocedural laboratory examination: Secondary | ICD-10-CM | POA: Insufficient documentation

## 2017-12-15 LAB — BASIC METABOLIC PANEL
Anion gap: 9 (ref 5–15)
BUN: 9 mg/dL (ref 6–20)
CO2: 29 mmol/L (ref 22–32)
Calcium: 9.4 mg/dL (ref 8.9–10.3)
Chloride: 102 mmol/L (ref 101–111)
Creatinine, Ser: 0.67 mg/dL (ref 0.61–1.24)
GFR calc Af Amer: >60 mL/min (ref >60)
GFR calc non Af Amer: >60 mL/min (ref >60)
Glucose, Bld: 54 mg/dL — ABNORMAL LOW (ref 65–99)
Potassium: 4.1 mmol/L (ref 3.5–5.1)
Sodium: 140 mmol/L (ref 135–145)

## 2017-12-15 LAB — CBC
HCT: 47.7 % (ref 39.0–52.0)
Hemoglobin: 16.5 g/dL (ref 13.0–17.0)
MCH: 33.5 pg (ref 26.0–34.0)
MCHC: 34.6 g/dL (ref 30.0–36.0)
MCV: 96.8 fL (ref 78.0–100.0)
Platelets: 189 10*3/uL (ref 150–400)
RBC: 4.93 MIL/uL (ref 4.22–5.81)
RDW: 12.7 % (ref 11.5–15.5)
WBC: 6.5 10*3/uL (ref 4.0–10.5)

## 2017-12-15 LAB — SURGICAL PCR SCREEN
MRSA, PCR: NEGATIVE
Staphylococcus aureus: NEGATIVE

## 2017-12-15 NOTE — Progress Notes (Signed)
SPOKE WITH DARCY AT DR. VARKEY'S OFFICE AND REQUESTED ORDERS (PREOP).

## 2017-12-15 NOTE — H&P (Signed)
PREOPERATIVE H&P  Chief Complaint: djd right shoulder  HPI: Joseph Simpson is a 83 y.o. male who presents for preoperative history and physical with a diagnosis of djd right shoulder. Symptoms are rated as moderate to severe, and have been worsening.  This is significantly impairing activities of daily living.  He has elected for surgical management.   Past Medical History:  Diagnosis Date  . Abnormal stress test December 2013   s/p cardiac cath with minimal nonobstructive CAD and normal LV function; medical management recommended  . Arthritis   . CAD (coronary artery disease)   . Dyslipidemia   . Dysrhythmia   . GERD (gastroesophageal reflux disease)   . History of hiatal hernia   . History of kidney stones   . Hypertension   . Macular degeneration   . Melanoma ([place])   . Obstructive sleep apnea 09/23/2007   Wore cpap previously. Stopped and not interested despite risks.     . OSA (obstructive sleep apnea)   . PAC (premature atrial contraction) 10/25/2015  . PVC's (premature ventricular contractions) 09/27/2013  . SVT (supraventricular tachycardia) ([place])   . Syncope    Past Surgical History:  Procedure Laterality Date  . APPENDECTOMY    . CARDIAC CATHETERIZATION  December 2013   minimal nonobstructive CAD with normal LV function  . CATARACT EXTRACTION    . CYSTOSCOPY WITH LITHOLAPAXY N/A 07/13/2016   Procedure: CYSTOSCOPY WITH LITHOLAPAXY;  Surgeon: Franchot Gallo, MD;  Location: [facility];  Service: Urology;  Laterality: N/A;  . CYSTOSCOPY/RETROGRADE/URETEROSCOPY/STONE EXTRACTION WITH BASKET  02/24/2012   Procedure: CYSTOSCOPY/RETROGRADE/URETEROSCOPY/STONE EXTRACTION WITH BASKET;  Surgeon: Franchot Gallo, MD;  Location: [facility];  Service: Urology;  Laterality: Bilateral;  1 HOUR  . GASTRECTOMY    . HEMORRHOID SURGERY    . HERNIA REPAIR  07/13/2016  . INGUINAL HERNIA REPAIR Left 07/13/2016   Procedure: REPAIR LEFT INGUINAL HERNIA;  Surgeon: Armandina Gemma, MD;   Location: [facility];  Service: General;  Laterality: Left;  . INSERTION OF MESH Left 07/13/2016   Procedure: INSERTION OF MESH;  Surgeon: Armandina Gemma, MD;  Location: [facility];  Service: General;  Laterality: Left;  Marland Kitchen MELANOMA EXCISION  05-05-2007   LEFT FOREARM  . NOSE SURGERY    . TONSILLECTOMY     Social History   Socioeconomic History  . Marital status: Widowed    Spouse name: Not on file  . Number of children: 1  . Years of education: Not on file  . Highest education level: Not on file  Social Needs  . Financial resource strain: Not on file  . Food insecurity - worry: Not on file  . Food insecurity - inability: Not on file  . Transportation needs - medical: Not on file  . Transportation needs - non-medical: Not on file  Occupational History  . Not on file  Tobacco Use  . Smoking status: Former Smoker    Packs/day: 0.10    Years: 4.00    Pack years: 0.40    Types: Cigarettes    Last attempt to quit: 09/03/1956    Years since quitting: 61.3  . Smokeless tobacco: Never Used  Substance and Sexual Activity  . Alcohol use: No    Alcohol/week: 0.0 oz  . Drug use: No  . Sexual activity: Yes  Other Topics Concern  . 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       Hobbies: ride motorcycle (slingshot 3 year), rabbit and dear hunt   Family History  Problem Relation Age of Onset  . Heart disease Mother        CABG in her 22s  . Pneumonia Father   . Cancer Sister        breast  . Lung cancer Brother        smoker   Allergies  Allergen Reactions  . Aspirin Other (See Comments)    UPSET STOMACH  . Percocet [Oxycodone-Acetaminophen] Nausea And Vomiting  . Vitamins [Apatate] Other (See Comments)    Bothers stomach   Prior to Admission medications   Medication Sig Start Date End Date Taking? Authorizing  Provider  acetaminophen (TYLENOL 8 HOUR ARTHRITIS PAIN) 650 MG CR tablet Take 650 mg by mouth 2 (two) times daily as needed for pain.   Yes [provider]  albuterol (PROAIR HFA) 108 (90 Base) MCG/ACT inhaler Inhale 2 puffs into the lungs every 6 (six) hours as needed for wheezing or shortness of breath. 06/10/17  Yes Marin Olp, MD  amLODipine (NORVASC) 2.5 MG tablet Take 1 tablet (2.5 mg total) by mouth daily. Patient taking differently: Take 2.5 mg by mouth every evening.  06/10/17  Yes Marin Olp, MD  bevacizumab (AVASTIN) 2.5 mg/0.1 mL SOLN 2.5 mg by Intravitreal route. Injection to eye every 8 weeks - use with vigamox   Yes [provider]  losartan (COZAAR) 100 MG tablet Take 0.5 tablets (50 mg total) by mouth 2 (two) times daily. 01/11/17  Yes Marin Olp, MD  metoCLOPramide (REGLAN) 10 MG tablet Take 5 mg by mouth 2 (two) times daily.   Yes [provider]  metoprolol tartrate (LOPRESSOR) 25 MG tablet Take 1 tablet (25 mg total) by mouth 2 (two) times daily. 11/11/17  Yes Strader, [place] M, PA-C  pantoprazole (PROTONIX) 40 MG tablet Take 40 mg by mouth 2 (two) times daily.    Yes [provider]  Polyethyl Glycol-Propyl Glycol (SYSTANE ULTRA) 0.4-0.3 % SOLN Place 1 drop into both eyes 3 (three) times daily.    Yes [provider]  tamsulosin (FLOMAX) 0.4 MG CAPS capsule Take 0.4 mg by mouth daily as needed (for urinary symptoms).  06/22/14  Yes [provider]  VIGAMOX 0.5 % ophthalmic solution Apply 1 drop to eye as directed. Take every 8 weeks as directed - use with avastin 05/28/14  Yes [provider]  atorvastatin (LIPITOR) 10 MG tablet Take 1 tablet (10 mg total) by mouth daily. 12/10/17   Marin Olp, MD     Positive ROS: All other systems have been reviewed and were otherwise negative with the exception of those mentioned in the HPI and as above.  Physical Exam: General: Alert, no acute  distress Cardiovascular: No pedal edema Respiratory: No cyanosis, no use of accessory musculature GI: No organomegaly, abdomen is soft and non-tender Skin: No lesions in the area of chief complaint Neurologic: Sensation intact distally Psychiatric: Patient is competent for consent with normal mood and affect Lymphatic: No axillary or cervical lymphadenopathy  MUSCULOSKELETAL: R shoulder: limited ROM, axillary nerve firing, wwp extremity distally.  Assessment: djd right shoulder  Plan: Plan for Procedure(s): TOTAL SHOULDER ARTHROPLASTY  The risks benefits and alternatives were discussed with the patient including but not limited to the risks of nonoperative treatment, versus surgical intervention including infection, bleeding, nerve injury,  blood  clots, cardiopulmonary complications, morbidity, mortality, among others, and they were willing to proceed.   Hiram Gash, MD  12/15/2017 2:15 PM

## 2017-12-16 NOTE — Progress Notes (Signed)
Anesthesia Chart Review: Patient is an 83 year old male scheduled for right total shoulder arthroplasty on 12/29/2017 by Dr. Ophelia Charter.  Surgeon orders were pending at the time of PAT.  History includes former smoker (quit '57), HTN, syncope '10, OSA (non-compliant CPAP), dyslipidemia, palpitations (PVCs, PACs, run of SVT '14), CAD (mimimal 10/2012), GERD, hiatal hernia, nephrolithiasis, arthritis, macular degeneration, melanoma in situ excision (left forearm) 05/05/07, left inguinal hernia repair 07/13/16, appendectomy, tonsillectomy, nose surgery.   - PCP is Dr. Garret Reddish. Last visit 12/10/17. He wrote, "Regarding upcoming surgery- he is medically maximized for surgery. No exertional chest pain or shortness of breath- will also need cardiology to sign off."  - Cardiologist is Dr. Peter Martinique. Last visit with Bernerd Pho, PA-C on 11/11/17. 12 month follow-up recommended. Per 11/29/17 telephone encounter by Jory Sims, "Chart reviewed as part of pre-operative protocol coverage. Given past medical history and time since last visit, based on ACC/AHA guidelines, TORRES HARDENBROOK would be at acceptable risk for the planned procedure without further cardiovascular testing. No changes in medication regimen or need to hold any medications perioperatively."  - Pulmonologist is Dr. Chesley Mires. Last visit seen 05/10/14.  - Urologist is Dr. Franchot Gallo. - Dermatologist is Dr. Jari Pigg.  - Retinal specialist is Dr. Tempie Hoist.  Meds include ProAir HFA, amlodipine, Lipitor, losartan, Reglan, Lopressor, Protonix, Flomax, Vigamox ophthalmic, Avastin ophthalmic injection Q 8 weeks (last dose 11/23/17; next appointment with Dr. Zigmund Daniel is scheduled for 01/04/18)  BP (!) 141/70   Pulse 75   Temp 36.5 C   Resp 20   Ht _0  (1.854 m)   Wt 181 lb 11.2 oz (82.4 kg)   SpO2 95%   BMI 23.97 kg/m   EKG 11/11/17: NSR.  24 hour Holter monitor 08/15/13: Sinus bradycardia to sinus tachycardia.   Occasional PVCs, couplets.  Rare PACs.  One run of SVT rate 158 bpm.  Cardiac cath 10/21/12 (Dr. Peter Martinique): Final Conclusions:   1. Minimal nonobstructive coronary disease (10-20% LAD, < 10-20% LCx). 2. Normal left ventricular function. EF 55-65%.  Recommendations: Medical therapy with focus on blood pressure control.  Nuclear stress test 10/05/12: Overall Impression:  Low risk stress nuclear study with no ischemia or infarction; note markedly positive ECG response in setting of hypertensive BP response. LV Ejection Fraction: 58%.  NL LV Function; NL Wall Motion  Echo 04/16/09: Study Conclusions 1. Left ventricle: The cavity size was normal. Wall thickness was  increased in a pattern of mild LVH. Systolic function was normal.  The estimated ejection fraction was in the range of 55% to 60%.  Wall motion was normal; there were no regional wall motion  abnormalities. 2. Aortic valve: Trivial regurgitation.  PFT 02/28/14 >> FEV1 2.71 (87%), FEV1% 78, TLC 4.80 (66%), DLCO 68%, no BD Labs 03/29/14 >> ESR 7, ANA negative, RF < 10, Scl 70 < 1  Chest CT (high resolution) 06/04/15: IMPRESSION: 1. Previously noted 4 mm left upper lobe nodule is unchanged, considered benign. 2. 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 No other stigmata of interstitial lung disease noted on today's examination. 3. Mild air trapping, indicative of mild small airways disease. 4. Small amount of cylindrical bronchiectasis and peribronchovascular micronodularity in the medial aspect of the left upper lobe is unchanged, and considered benign. 5. Atherosclerosis, including 3 vessel coronary artery disease.  Assessment for potential risk factor modification, dietary therapy or pharmacologic therapy may be warranted, if clinically  indicated. 6. Heterogeneous hepatic steatosis redemonstrated.  Preoperative labs noted.  He has medical and cardiac clearances for surgery.  He is on Avastin, but only has ophthalmic injection and last dose will be > 28 days from his surgery.  If no acute changes then I would anticipate that he could proceed as planned.  [facility]/Anesthesiology Phone [phone] 12/16/2017 5:45 PM

## 2017-12-28 MED ORDER — TRANEXAMIC ACID 1000 MG/10ML IV SOLN
1000.0000 mg | INTRAVENOUS | Status: AC
  Administered 2017-12-29: 1000 mg via INTRAVENOUS
  Filled 2017-12-28: qty 1100

## 2017-12-29 ENCOUNTER — Encounter (HOSPITAL_COMMUNITY)
Admission: RE | Disposition: A | Discharge status: Home / Self Care | Payer: Self-pay | Source: Ambulatory Visit | Attending: Orthopaedic Surgery

## 2017-12-29 ENCOUNTER — Inpatient Hospital Stay (HOSPITAL_COMMUNITY): Payer: Medicare Other | Admitting: Certified Registered"

## 2017-12-29 ENCOUNTER — Inpatient Hospital Stay (HOSPITAL_COMMUNITY)
Admission: RE | Admit: 2017-12-29 | Discharge: 2017-12-30 | DRG: 483 | Disposition: A | Discharge status: Home / Self Care | Payer: Medicare Other | Source: Ambulatory Visit | Attending: Orthopaedic Surgery | Admitting: Orthopaedic Surgery

## 2017-12-29 ENCOUNTER — Inpatient Hospital Stay (HOSPITAL_COMMUNITY): Payer: Medicare Other | Admitting: Vascular Surgery

## 2017-12-29 ENCOUNTER — Encounter (HOSPITAL_COMMUNITY): Payer: Self-pay | Admitting: Certified Registered"

## 2017-12-29 ENCOUNTER — Inpatient Hospital Stay (HOSPITAL_COMMUNITY): Payer: Medicare Other

## 2017-12-29 DIAGNOSIS — Z96611 Presence of right artificial shoulder joint: Secondary | ICD-10-CM | POA: Diagnosis not present

## 2017-12-29 DIAGNOSIS — E785 Hyperlipidemia, unspecified: Secondary | ICD-10-CM | POA: Diagnosis present

## 2017-12-29 DIAGNOSIS — K219 Gastro-esophageal reflux disease without esophagitis: Secondary | ICD-10-CM | POA: Diagnosis not present

## 2017-12-29 DIAGNOSIS — Z801 Family history of malignant neoplasm of trachea, bronchus and lung: Secondary | ICD-10-CM | POA: Diagnosis not present

## 2017-12-29 DIAGNOSIS — I471 Supraventricular tachycardia: Secondary | ICD-10-CM | POA: Diagnosis not present

## 2017-12-29 DIAGNOSIS — Z85820 Personal history of malignant melanoma of skin: Secondary | ICD-10-CM | POA: Diagnosis not present

## 2017-12-29 DIAGNOSIS — M659 Synovitis and tenosynovitis, unspecified: Secondary | ICD-10-CM | POA: Diagnosis not present

## 2017-12-29 DIAGNOSIS — I10 Essential (primary) hypertension: Secondary | ICD-10-CM | POA: Diagnosis present

## 2017-12-29 DIAGNOSIS — G4733 Obstructive sleep apnea (adult) (pediatric): Secondary | ICD-10-CM | POA: Diagnosis not present

## 2017-12-29 DIAGNOSIS — H3530 Unspecified macular degeneration: Secondary | ICD-10-CM | POA: Diagnosis present

## 2017-12-29 DIAGNOSIS — I2510 Atherosclerotic heart disease of native coronary artery without angina pectoris: Secondary | ICD-10-CM | POA: Diagnosis present

## 2017-12-29 DIAGNOSIS — M19011 Primary osteoarthritis, right shoulder: Principal | ICD-10-CM | POA: Diagnosis present

## 2017-12-29 DIAGNOSIS — Z09 Encounter for follow-up examination after completed treatment for conditions other than malignant neoplasm: Secondary | ICD-10-CM

## 2017-12-29 DIAGNOSIS — Z471 Aftercare following joint replacement surgery: Secondary | ICD-10-CM | POA: Diagnosis not present

## 2017-12-29 DIAGNOSIS — Z803 Family history of malignant neoplasm of breast: Secondary | ICD-10-CM | POA: Diagnosis not present

## 2017-12-29 DIAGNOSIS — Z8249 Family history of ischemic heart disease and other diseases of the circulatory system: Secondary | ICD-10-CM | POA: Diagnosis not present

## 2017-12-29 DIAGNOSIS — G8918 Other acute postprocedural pain: Secondary | ICD-10-CM | POA: Diagnosis not present

## 2017-12-29 DIAGNOSIS — Z87442 Personal history of urinary calculi: Secondary | ICD-10-CM

## 2017-12-29 HISTORY — PX: TOTAL SHOULDER ARTHROPLASTY: SHX126

## 2017-12-29 LAB — GLUCOSE, CAPILLARY: Glucose-Capillary: 99 mg/dL (ref 65–99)

## 2017-12-29 SURGERY — ARTHROPLASTY, SHOULDER, TOTAL
Anesthesia: Regional | Laterality: Right

## 2017-12-29 MED ORDER — SUGAMMADEX SODIUM 200 MG/2ML IV SOLN
INTRAVENOUS | Status: AC
  Filled 2017-12-29: qty 2

## 2017-12-29 MED ORDER — ONDANSETRON HCL 4 MG/2ML IJ SOLN
INTRAMUSCULAR | Status: DC | PRN
  Administered 2017-12-29: 4 mg via INTRAVENOUS

## 2017-12-29 MED ORDER — 0.9 % SODIUM CHLORIDE (POUR BTL) OPTIME
TOPICAL | Status: DC | PRN
  Administered 2017-12-29: 1000 mL

## 2017-12-29 MED ORDER — ONDANSETRON HCL 4 MG/2ML IJ SOLN
INTRAMUSCULAR | Status: AC
  Filled 2017-12-29: qty 2

## 2017-12-29 MED ORDER — ARTIFICIAL TEARS OPHTHALMIC OINT
TOPICAL_OINTMENT | OPHTHALMIC | Status: DC | PRN
  Administered 2017-12-29: 1 via OPHTHALMIC

## 2017-12-29 MED ORDER — ONDANSETRON HCL 4 MG/2ML IJ SOLN: 4.0000 mg | Freq: Four times a day (QID) | INTRAMUSCULAR | Status: DC | PRN

## 2017-12-29 MED ORDER — LIDOCAINE 2% (20 MG/ML) 5 ML SYRINGE
INTRAMUSCULAR | Status: AC
  Filled 2017-12-29: qty 5

## 2017-12-29 MED ORDER — METOCLOPRAMIDE HCL 5 MG/ML IJ SOLN: 5.0000 mg | Freq: Three times a day (TID) | INTRAMUSCULAR | Status: DC | PRN

## 2017-12-29 MED ORDER — METOPROLOL TARTRATE 25 MG PO TABS
25.0000 mg | ORAL_TABLET | Freq: Two times a day (BID) | ORAL | Status: DC
  Administered 2017-12-29 – 2017-12-30 (×2): 25 mg via ORAL
  Filled 2017-12-29 (×2): qty 1

## 2017-12-29 MED ORDER — SODIUM CHLORIDE 0.9 % IR SOLN
Status: DC | PRN
  Administered 2017-12-29: 3000 mL

## 2017-12-29 MED ORDER — GLYCOPYRROLATE 0.2 MG/ML IJ SOLN
INTRAMUSCULAR | Status: DC | PRN
  Administered 2017-12-29: 0.2 mg via INTRAVENOUS

## 2017-12-29 MED ORDER — DIPHENHYDRAMINE HCL 12.5 MG/5ML PO ELIX: 12.5000 mg | ORAL_SOLUTION | ORAL | Status: DC | PRN

## 2017-12-29 MED ORDER — OXYCODONE HCL 5 MG PO TABS
5.0000 mg | ORAL_TABLET | ORAL | Status: DC | PRN
  Administered 2017-12-30 (×3): 5 mg via ORAL
  Filled 2017-12-29 (×2): qty 1

## 2017-12-29 MED ORDER — PROPOFOL 10 MG/ML IV BOLUS
INTRAVENOUS | Status: AC
  Filled 2017-12-29: qty 20

## 2017-12-29 MED ORDER — ZOLPIDEM TARTRATE 5 MG PO TABS: 5.0000 mg | ORAL_TABLET | Freq: Every evening | ORAL | Status: DC | PRN

## 2017-12-29 MED ORDER — MIDAZOLAM HCL 2 MG/2ML IJ SOLN
1.0000 mg | Freq: Once | INTRAMUSCULAR | Status: AC
  Administered 2017-12-29: 1 mg via INTRAVENOUS

## 2017-12-29 MED ORDER — CHLORHEXIDINE GLUCONATE 4 % EX LIQD: 60.0000 mL | Freq: Once | CUTANEOUS | Status: DC

## 2017-12-29 MED ORDER — ROCURONIUM BROMIDE 10 MG/ML (PF) SYRINGE
PREFILLED_SYRINGE | INTRAVENOUS | Status: AC
  Filled 2017-12-29: qty 5

## 2017-12-29 MED ORDER — CEFAZOLIN SODIUM-DEXTROSE 2-4 GM/100ML-% IV SOLN
2.0000 g | INTRAVENOUS | Status: AC
  Administered 2017-12-29: 2 g via INTRAVENOUS

## 2017-12-29 MED ORDER — AMLODIPINE BESYLATE 2.5 MG PO TABS
2.5000 mg | ORAL_TABLET | Freq: Every day | ORAL | Status: DC
  Administered 2017-12-29 – 2017-12-30 (×2): 2.5 mg via ORAL
  Filled 2017-12-29 (×2): qty 1

## 2017-12-29 MED ORDER — SUGAMMADEX SODIUM 200 MG/2ML IV SOLN
INTRAVENOUS | Status: DC | PRN
  Administered 2017-12-29: 200 mg via INTRAVENOUS

## 2017-12-29 MED ORDER — OXYCODONE HCL 5 MG PO TABS
10.0000 mg | ORAL_TABLET | ORAL | Status: DC | PRN
  Filled 2017-12-29: qty 2

## 2017-12-29 MED ORDER — ACETAMINOPHEN 500 MG PO TABS
1000.0000 mg | ORAL_TABLET | Freq: Four times a day (QID) | ORAL | Status: AC
  Administered 2017-12-29 – 2017-12-30 (×4): 1000 mg via ORAL
  Filled 2017-12-29 (×4): qty 2

## 2017-12-29 MED ORDER — LOSARTAN POTASSIUM 50 MG PO TABS
50.0000 mg | ORAL_TABLET | Freq: Two times a day (BID) | ORAL | Status: DC
  Administered 2017-12-29 – 2017-12-30 (×2): 50 mg via ORAL
  Filled 2017-12-29 (×2): qty 1

## 2017-12-29 MED ORDER — ONDANSETRON HCL 4 MG PO TABS: 4.0000 mg | ORAL_TABLET | Freq: Four times a day (QID) | ORAL | Status: DC | PRN

## 2017-12-29 MED ORDER — LACTATED RINGERS IV SOLN
INTRAVENOUS | Status: DC
  Administered 2017-12-29: 10:00:00 via INTRAVENOUS

## 2017-12-29 MED ORDER — CEFAZOLIN SODIUM-DEXTROSE 1-4 GM/50ML-% IV SOLN
1.0000 g | Freq: Four times a day (QID) | INTRAVENOUS | Status: AC
  Administered 2017-12-29 – 2017-12-30 (×3): 1 g via INTRAVENOUS
  Filled 2017-12-29 (×3): qty 50

## 2017-12-29 MED ORDER — CEFAZOLIN SODIUM-DEXTROSE 2-4 GM/100ML-% IV SOLN
INTRAVENOUS | Status: AC
  Filled 2017-12-29: qty 100

## 2017-12-29 MED ORDER — PROMETHAZINE HCL 25 MG/ML IJ SOLN: 6.2500 mg | INTRAMUSCULAR | Status: DC | PRN

## 2017-12-29 MED ORDER — PANTOPRAZOLE SODIUM 40 MG PO TBEC
40.0000 mg | DELAYED_RELEASE_TABLET | Freq: Two times a day (BID) | ORAL | Status: DC
  Administered 2017-12-29 – 2017-12-30 (×2): 40 mg via ORAL
  Filled 2017-12-29 (×2): qty 1

## 2017-12-29 MED ORDER — EPHEDRINE SULFATE-NACL 50-0.9 MG/10ML-% IV SOSY
PREFILLED_SYRINGE | INTRAVENOUS | Status: DC | PRN
  Administered 2017-12-29 (×2): 5 mg via INTRAVENOUS

## 2017-12-29 MED ORDER — DEXAMETHASONE SODIUM PHOSPHATE 10 MG/ML IJ SOLN
INTRAMUSCULAR | Status: DC | PRN
  Administered 2017-12-29: 10 mg via INTRAVENOUS

## 2017-12-29 MED ORDER — METOCLOPRAMIDE HCL 5 MG PO TABS: 5.0000 mg | ORAL_TABLET | Freq: Three times a day (TID) | ORAL | Status: DC | PRN

## 2017-12-29 MED ORDER — METOCLOPRAMIDE HCL 5 MG PO TABS
5.0000 mg | ORAL_TABLET | Freq: Two times a day (BID) | ORAL | Status: DC
  Administered 2017-12-29 – 2017-12-30 (×2): 5 mg via ORAL
  Filled 2017-12-29 (×2): qty 1

## 2017-12-29 MED ORDER — ATORVASTATIN CALCIUM 10 MG PO TABS
10.0000 mg | ORAL_TABLET | Freq: Every day | ORAL | Status: DC
  Administered 2017-12-29 – 2017-12-30 (×2): 10 mg via ORAL
  Filled 2017-12-29 (×2): qty 1

## 2017-12-29 MED ORDER — DOCUSATE SODIUM 100 MG PO CAPS
100.0000 mg | ORAL_CAPSULE | Freq: Two times a day (BID) | ORAL | Status: DC
  Administered 2017-12-29 – 2017-12-30 (×2): 100 mg via ORAL
  Filled 2017-12-29 (×2): qty 1

## 2017-12-29 MED ORDER — PROPOFOL 10 MG/ML IV BOLUS
INTRAVENOUS | Status: DC | PRN
  Administered 2017-12-29: 130 mg via INTRAVENOUS

## 2017-12-29 MED ORDER — MIDAZOLAM HCL 2 MG/2ML IJ SOLN
INTRAMUSCULAR | Status: AC
  Administered 2017-12-29: 1 mg via INTRAVENOUS
  Filled 2017-12-29: qty 2

## 2017-12-29 MED ORDER — MEPERIDINE HCL 50 MG/ML IJ SOLN: 6.2500 mg | INTRAMUSCULAR | Status: DC | PRN

## 2017-12-29 MED ORDER — PHENYLEPHRINE HCL 10 MG/ML IJ SOLN
INTRAVENOUS | Status: DC | PRN
  Administered 2017-12-29: 30 ug/min via INTRAVENOUS

## 2017-12-29 MED ORDER — EPHEDRINE 5 MG/ML INJ
INTRAVENOUS | Status: AC
  Filled 2017-12-29: qty 10

## 2017-12-29 MED ORDER — HYDROMORPHONE HCL 1 MG/ML IJ SOLN: 0.2500 mg | INTRAMUSCULAR | Status: DC | PRN

## 2017-12-29 MED ORDER — FENTANYL CITRATE (PF) 100 MCG/2ML IJ SOLN
INTRAMUSCULAR | Status: AC
  Administered 2017-12-29: 50 ug via INTRAVENOUS
  Filled 2017-12-29: qty 2

## 2017-12-29 MED ORDER — MORPHINE SULFATE (PF) 2 MG/ML IV SOLN: 2.0000 mg | INTRAVENOUS | Status: DC | PRN

## 2017-12-29 MED ORDER — ARTIFICIAL TEARS OPHTHALMIC OINT
TOPICAL_OINTMENT | OPHTHALMIC | Status: AC
  Filled 2017-12-29: qty 3.5

## 2017-12-29 MED ORDER — FENTANYL CITRATE (PF) 100 MCG/2ML IJ SOLN
50.0000 ug | Freq: Once | INTRAMUSCULAR | Status: AC
  Administered 2017-12-29: 50 ug via INTRAVENOUS

## 2017-12-29 MED ORDER — DEXAMETHASONE SODIUM PHOSPHATE 10 MG/ML IJ SOLN
INTRAMUSCULAR | Status: AC
  Filled 2017-12-29: qty 1

## 2017-12-29 MED ORDER — ROPIVACAINE HCL 5 MG/ML IJ SOLN
INTRAMUSCULAR | Status: DC | PRN
  Administered 2017-12-29: 30 mL via PERINEURAL

## 2017-12-29 MED ORDER — ROCURONIUM BROMIDE 100 MG/10ML IV SOLN
INTRAVENOUS | Status: DC | PRN
  Administered 2017-12-29: 50 mg via INTRAVENOUS
  Administered 2017-12-29: 20 mg via INTRAVENOUS

## 2017-12-29 MED ORDER — LIDOCAINE HCL (CARDIAC) 20 MG/ML IV SOLN
INTRAVENOUS | Status: DC | PRN
  Administered 2017-12-29: 50 mg via INTRAVENOUS

## 2017-12-29 SURGICAL SUPPLY — 60 items
AID PSTN UNV HD RSTRNT DISP (MISCELLANEOUS) ×1
APL SKNCLS STERI-STRIP NONHPOA (GAUZE/BANDAGES/DRESSINGS) ×1
BASEPLATE SHOULDER FW 15D 29 (Joint) ×1 IMPLANT
BENZOIN TINCTURE PRP APPL 2/3 (GAUZE/BANDAGES/DRESSINGS) ×2 IMPLANT
BIT DRILL 3.2 PERIPHERAL SCREW (BIT) ×1 IMPLANT
BLADE SAW SAG 29X58X.64 (BLADE) IMPLANT
BLADE SAW SAG 73X25 THK (BLADE) ×1
BLADE SAW SGTL 73X25 THK (BLADE) IMPLANT
BSPLAT GLND 15D 29 FULL WDG (Joint) ×1 IMPLANT
CAP SHOULDER REVTOTAL 2 ×1 IMPLANT
CHLORAPREP W/TINT 26ML (MISCELLANEOUS) ×6 IMPLANT
COVER SURGICAL LIGHT HANDLE (MISCELLANEOUS) ×2 IMPLANT
DRAPE INCISE IOBAN 66X45 STRL (DRAPES) ×2 IMPLANT
DRAPE U-SHAPE 47X51 STRL (DRAPES) ×1 IMPLANT
DRSG AQUACEL AG ADV 3.5X 6 (GAUZE/BANDAGES/DRESSINGS) ×2 IMPLANT
ELECT REM PT RETURN 9FT ADLT (ELECTROSURGICAL) ×2
ELECTRODE REM PT RTRN 9FT ADLT (ELECTROSURGICAL) ×1 IMPLANT
GLOVE BIOGEL PI IND STRL 8 (GLOVE) ×1 IMPLANT
GLOVE BIOGEL PI INDICATOR 8 (GLOVE) ×1
GLOVE ECLIPSE 8.0 STRL XLNG CF (GLOVE) ×4 IMPLANT
GOWN STRL REUS W/ TWL LRG LVL3 (GOWN DISPOSABLE) ×2 IMPLANT
GOWN STRL REUS W/ TWL XL LVL3 (GOWN DISPOSABLE) ×2 IMPLANT
GOWN STRL REUS W/TWL LRG LVL3 (GOWN DISPOSABLE) ×4
GOWN STRL REUS W/TWL XL LVL3 (GOWN DISPOSABLE) ×4
GUIDEWIRE GLENOID 2.5X220 (WIRE) ×1 IMPLANT
HANDPIECE INTERPULSE COAX TIP (DISPOSABLE) ×2
KIT BASIN OR (CUSTOM PROCEDURE TRAY) ×2 IMPLANT
KIT ROOM TURNOVER OR (KITS) ×2 IMPLANT
KIT STABILIZATION SHOULDER (MISCELLANEOUS) ×1 IMPLANT
MANIFOLD NEPTUNE II (INSTRUMENTS) ×2 IMPLANT
NDL HYPO 25GX1X1/2 BEV (NEEDLE) IMPLANT
NDL MAYO TROCAR (NEEDLE) ×1 IMPLANT
NEEDLE HYPO 25GX1X1/2 BEV (NEEDLE) IMPLANT
NEEDLE MAYO TROCAR (NEEDLE) ×2 IMPLANT
NS IRRIG 1000ML POUR BTL (IV SOLUTION) ×2 IMPLANT
PACK SHOULDER (CUSTOM PROCEDURE TRAY) ×2 IMPLANT
PAD ARMBOARD 7.5X6 YLW CONV (MISCELLANEOUS) ×4 IMPLANT
PERIPHERAL SCREW DRILL BIT ×1 IMPLANT
RESTRAINT HEAD UNIVERSAL NS (MISCELLANEOUS) ×2 IMPLANT
SCREW BONE THREAD 6.5X35 (Screw) ×1 IMPLANT
SET HNDPC FAN SPRY TIP SCT (DISPOSABLE) IMPLANT
SLING ARM IMMOBILIZER LRG (SOFTGOODS) ×1 IMPLANT
SPONGE LAP 18X18 X RAY DECT (DISPOSABLE) ×2 IMPLANT
STRIP CLOSURE SKIN 1/2X4 (GAUZE/BANDAGES/DRESSINGS) ×2 IMPLANT
SUCTION FRAZIER HANDLE 10FR (MISCELLANEOUS) ×1
SUCTION TUBE FRAZIER 10FR DISP (MISCELLANEOUS) ×1 IMPLANT
SUT ETHIBOND 2 V 37 (SUTURE) ×2 IMPLANT
SUT ETHIBOND NAB CT1 #1 30IN (SUTURE) ×2 IMPLANT
SUT FIBERWIRE #5 38 CONV NDL (SUTURE) ×12
SUT MNCRL AB 4-0 PS2 18 (SUTURE) ×2 IMPLANT
SUT MON AB 3-0 SH 27 (SUTURE) ×2
SUT MON AB 3-0 SH27 (SUTURE) ×1 IMPLANT
SUT VIC AB 0 CT1 18XCR BRD 8 (SUTURE) ×1 IMPLANT
SUT VIC AB 0 CT1 8-18 (SUTURE) ×2
SUTURE FIBERWR #5 38 CONV NDL (SUTURE) ×6 IMPLANT
TOWEL OR 17X24 6PK STRL BLUE (TOWEL DISPOSABLE) ×2 IMPLANT
TOWEL OR 17X26 10 PK STRL BLUE (TOWEL DISPOSABLE) ×2 IMPLANT
TOWER CARTRIDGE SMART MIX (DISPOSABLE) IMPLANT
TRAY FOLEY CATH SILVER 14FR (SET/KITS/TRAYS/PACK) IMPLANT
WATER STERILE IRR 1000ML POUR (IV SOLUTION) ×2 IMPLANT

## 2017-12-29 NOTE — Transfer of Care (Signed)
Immediate Anesthesia Transfer of Care Note  Patient: Joseph Simpson  Procedure(s) Performed: TOTAL SHOULDER ARTHROPLASTY (Right )  Patient Location: PACU  Anesthesia Type:General and Regional  Level of Consciousness: awake, alert  and oriented  Airway & Oxygen Therapy: Patient Spontanous Breathing and Patient connected to face mask oxygen  Post-op Assessment: Report given to RN and Post -op Vital signs reviewed and stable  Post vital signs: Reviewed and stable  Last Vitals:  Vitals:   12/29/17 1045 12/29/17 1415  BP: 137/67 137/78  Pulse: 67 74  Resp: 14 17  Temp:  (!) 36.2 C  SpO2: 97% 98%    Last Pain:  Vitals:   12/29/17 0940  TempSrc: Oral         Complications: No apparent anesthesia complications

## 2017-12-29 NOTE — Anesthesia Procedure Notes (Signed)
Procedure Name: Intubation Date/Time: 12/29/2017 11:51 AM Performed by: Gwyndolyn Saxon, CRNA Pre-anesthesia Checklist: Patient identified, Emergency Drugs available, Patient being monitored, Timeout performed and Suction available Patient Re-evaluated:Patient Re-evaluated prior to induction Oxygen Delivery Method: Circle system utilized Preoxygenation: Pre-oxygenation with 100% oxygen Induction Type: IV induction Ventilation: Mask ventilation without difficulty and Oral airway inserted - appropriate to patient size Laryngoscope Size: Mac and 3 Grade View: Grade I Tube type: Oral Tube size: 7.5 mm Number of attempts: 1 Placement Confirmation: ETT inserted through vocal cords under direct vision,  positive ETCO2,  CO2 detector and breath sounds checked- equal and bilateral Secured at: 22 cm Tube secured with: Tape Dental Injury: Teeth and Oropharynx as per pre-operative assessment

## 2017-12-29 NOTE — Interval H&P Note (Signed)
Discussed case, risks and benefits with patient again.  All questions answered, no change to history.  Emmanuel Gruenhagen MD  

## 2017-12-29 NOTE — Op Note (Signed)
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  Great stability at end of case,  Axillary nerve intact at end of case.  Cephalic vein was cauterized due to oozing at end of case.  Good hemostasis at completion.    Post-operative plan: The patient will be nwb in sling x4 weeks w pt to start after clinic.  The patient will be admitted overnight.  DVT prophylaxis not indicated in isolated upper extremity surgery patient with no specific risks factors.  Pain control with PRN pain medication preferring oral medicines.  Follow up plan will be scheduled in approximately 7 days with AP XR.  ER limited to 30 postop  Post-Op Diagnosis: Same Surgeons:Primary: Hiram Gash, MD Assistants:Brandon Leslee Home OPA Location: [facility] OR ROOM 03 Anesthesia: Choice Antibiotics: Ancef 2g preop Tourniquet time: * No tourniquets in log * Estimated Blood Loss: 323 Complications: None Specimens: None Implants: Implant Name Type Inv. Item Serial No. Manufacturer Lot No. LRB No. Used Action  FULL-WEDGE AUGMENT BASEPLATE   [id number] [employer]  Right 1 Implanted  SCREW BONE 50 X 46    [employer]  Right 1 Implanted  STANDARD GLENOSPHERE   [id number] [employer]  Right 1 Implanted  SCREW BONE 6.5 X 35    [employer]  Right 1 Implanted  SCREW BONE 5.0 X 34    [employer]  Right 2 Implanted  SCREW BONE 5.0 X 42    [employer]  Right 1 Implanted  REVERSED IMPLANT   [id number] [employer]  Right 1 Implanted  IMPLANT REVERSE SHOULDER 0X3.5 - [id number] Shoulder IMPLANT REVERSE SHOULDER 0X3.5 [id number] [facility] [employer]  Right 1 Implanted  HUMERAL STEM AEQUALIS 3BX74MM - [id number] Stem HUMERAL STEM Eugenia Pancoast 3BX74MM [id number] [employer]   Right 1 Implanted    Indications for Surgery:   Joseph Simpson is a 83 y.o. male with end stage OA of the right shoulder with extremely limited motion and pain.  He tried injections and other non-operative measures but can not take NSAID due to GI bleeds.  Benefits and risks of operative and nonoperative management were discussed prior to surgery with patient/guardian(s) and informed consent form was completed.  Specific risks including infection, need for additional surgery, fracture, dislocation, nerve injury   Procedure:   The patient was identified in the preoperative holding area where the surgical site was marked. The patient was taken to the OR where a procedural timeout was called and the above noted anesthesia was induced.  The patient was positioned beachchair on allen table with spider positioner.  Preoperative antibiotics were dosed.  The patient's right shoulder was prepped and draped in the usual sterile fashion.  A second preoperative timeout was called.      Standard deltopectoral approach was performed with a #10 blade. We dissected down to the subcutaneous tissues and the cephalic vein was taken laterally with the deltoid. Clavipectoral fascia was incised in line with the incision. Deep retractors were placed. The long of the biceps tendon was identified and there was significant tenosynovitis present.  Tenodesis was performed to the pectoralis tendon with #2 Ethibond. The remaining biceps was followed up into the rotator interval where it was released.   The subscapularis was taken down in a full thickness layer  with capsule along the humeral neck extending inferiorly around the humeral head. We continued releasing the capsule directly off of the osteophytes inferiorly all the way around the corner. This allowed [place] to dislocate the humeral head.   The humeral head had evidence of severe osteoarthritic wear with full-thickness cartilage loss and exposed subchondral bone. There was  significant flattening of the humeral head.   The rotator cuff was carefully examined and noted to be torn anteriorly in an irreparable fashion.  The decision was confirmed that a reverse total shoulder was indicated for this patient.  There were osteophytes along the inferior humeral neck. The osteophytes were removed with an osteotome and a rongeur.  Osteophytes were removed with a rongeur and an osteotome and the anatomic neck was well visualized.     A humeral cutting guide was inserted down the intramedullary canal. The version was set at 20 of retroversion. Humeral osteotomy was performed with an oscillating saw. The head fragment was passed off the back table. A starter awl was used to open the humeral canal. We next used T-handle straight sound reamers to ream up to an appropriate fit. A chisel was used to remove proximal humeral bone. We then broached starting with a size one broach and broaching up to 3 which obtained an appropriate fit. The broach handle was removed. A cut protector was placed. The broach handle was removed and a cut protector was placed. The humerus was retracted posteriorly and we turned our attention to glenoid exposure.  The subscapularis was again identified and immediately we took care to palpate the axillary nerve anteriorly and verify its position with gentle palpation as well as the tug test.  We then released the SGHL with bovie cautery prior to placing a curved mayo at the junction of the anterior glenoid well above the axillary nerve and bluntly dissecting the subscapularis from the capsule.  We then carefully protected the axillary nerve as we gently released the inferior capsule to fully mobilize the subscapularis.  An anterior deltoid retractor was then placed as well as a small Hohmann retractor superiorly.   The glenoid was inspected and had evidence of severe osteoarthritic wear with full-thickness cartilage loss and exposed subchondral bone with a B2 glenoid  and significant posterior wear and erosion. The remaining labrum was removed circumferentially taking great care not to disrupt the posterior capsule.   The glenoid drill guide was placed and used to drill a guide pin in the center, inferior position as was planned with blueprint preop planning.  We took our time to verify position and checked exit in the anterior scapula.  We then prepared for the 15 deg wedge reamer.  We reamed over this pin and were able to get a concentric flush fit with this angled reamer.   The center hole was drilled over the guidepin in a near anatomic angle of version. Next the glenoid vault was drilled back to a depth of 35 mm.  We tapped and then placed a 60mm size baseplate with full wedge was selected with a 35 mm x 6.5 mm length central screw.  The base plate was screwed into the glenoid vault obtaining secure fixation. We next placed superior and inferior locking screws for additional fixation.  Next a 42 mm glenosphere was selected and impacted onto the baseplate. The center screw was tightened.  We turned attention back to the humeral side. The cut protector was removed. We trialed with multiple size tray and polyethylene options and selected  a +6 which provided good stability and range of motion without excess soft tissue tension. The offset was dialed in to match the normal anatomy. The shoulder was trialed.  There was good ROM in all planes and the shoulder was stable with no inferior translation.  The real humeral implants were opened after again confirming sizes.  The trial was removed. #5 Fiberwire sutures passed through the humeral neck for subscap repair. The humeral component was press-fit obtaining a secure fit. A +0 high offset tray was selected and impacted onto the stem.  A 42+6 polyethylene liner was impacted onto the stem.  The joint was reduced and thoroughly irrigated with pulsatile lavage. Subscap was repaired back with #5 Fiberwire sutures through bone  tunnels. Hemostasis was obtained. The deltopectoral interval was reapproximated with #1 Ethibond. The subcutaneous tissues were closed with 2-0 Vicryl and the skin was closed with running monocryl.    The wounds were cleaned and dried and an Aquacel dressing was placed. The drapes taken down. The arm was placed into sling with abduction pillow. Patient was awakened, extubated, and transferred to the recovery room in stable condition. There were no intraoperative complications. The sponge, needle, and attention counts were correct at the end of the case.   Joya Gaskins, OPA-C, present and scrubbed throughout the case, critical for completion in a timely fashion, and for retraction, instrumentation, closure.

## 2017-12-29 NOTE — Anesthesia Preprocedure Evaluation (Signed)
Anesthesia Evaluation  Patient identified by MRN, date of birth, ID band Patient awake    Reviewed: Allergy & Precautions, H&P , NPO status , Patient's Chart, lab work & pertinent test results, reviewed documented beta blocker date and time   Airway Mallampati: II  TM Distance: >3 FB Neck ROM: full    Dental no notable dental hx.    Pulmonary sleep apnea , former smoker,    Pulmonary exam normal breath sounds clear to auscultation       Cardiovascular hypertension, Pt. on medications and Pt. on home beta blockers Normal cardiovascular exam+ dysrhythmias Supra Ventricular Tachycardia  Rhythm:regular Rate:Normal     Neuro/Psych    GI/Hepatic GERD  ,  Endo/Other    Renal/GU      Musculoskeletal  (+) Arthritis , Osteoarthritis,    Abdominal   Peds  Hematology   Anesthesia Other Findings   Reproductive/Obstetrics                             Anesthesia Physical  Anesthesia Plan  ASA: III  Anesthesia Plan: General and Regional   Post-op Pain Management: GA combined w/ Regional for post-op pain   Induction: Intravenous  PONV Risk Score and Plan: 2 and Ondansetron and Midazolam  Airway Management Planned: Oral ETT  Additional Equipment:   Intra-op Plan:   Post-operative Plan: Extubation in OR  Informed Consent: I have reviewed the patients History and Physical, chart, labs and discussed the procedure including the risks, benefits and alternatives for the proposed anesthesia with the patient or authorized representative who has indicated his/her understanding and acceptance.   Dental Advisory Given and Dental advisory given  Plan Discussed with: CRNA and Surgeon  Anesthesia Plan Comments: (  Discussed general anesthesia, including possible nausea, instrumentation of airway, sore throat,pulmonary aspiration, etc. I asked if the were any outstanding questions, or  concerns before we  proceeded. )        Anesthesia Quick Evaluation

## 2017-12-29 NOTE — Anesthesia Postprocedure Evaluation (Signed)
Anesthesia Post Note  Patient: Joseph Simpson  Procedure(s) Performed: TOTAL SHOULDER ARTHROPLASTY (Right )     Patient location during evaluation: PACU Anesthesia Type: Regional and General Level of consciousness: awake and alert Pain management: pain level controlled Vital Signs Assessment: post-procedure vital signs reviewed and stable Respiratory status: spontaneous breathing, nonlabored ventilation and respiratory function stable Cardiovascular status: blood pressure returned to baseline and stable Postop Assessment: no apparent nausea or vomiting Anesthetic complications: no    Last Vitals:  Vitals:   12/29/17 1430 12/29/17 1445  BP: 129/74 123/74  Pulse: 71 75  Resp: 16 19  Temp:    SpO2: 92% 93%    Last Pain:  Vitals:   12/29/17 1415  TempSrc:   PainSc: 0-No pain                 Lynda Rainwater

## 2017-12-29 NOTE — Anesthesia Procedure Notes (Signed)
Anesthesia Regional Block: Interscalene brachial plexus block   Pre-Anesthetic Checklist: ,, timeout performed, Correct Patient, Correct Site, Correct Laterality, Correct Procedure, Correct Position, site marked, Risks and benefits discussed,  Surgical consent,  Pre-op evaluation,  At surgeon's request and post-op pain management  Laterality: Right  Prep: chloraprep       Needles:  Injection technique: Single-shot  Needle Type: Stimiplex     Needle Length: 9cm  Needle Gauge: 21     Additional Needles:   Procedures:,,,, ultrasound used (permanent image in chart),,,,  Narrative:  Start time: 12/29/2017 10:20 AM End time: 12/29/2017 10:25 AM Injection made incrementally with aspirations every 5 mL.  Performed by: Personally  Anesthesiologist: Lynda Rainwater, MD

## 2017-12-30 ENCOUNTER — Encounter (HOSPITAL_COMMUNITY): Payer: Self-pay | Admitting: General Practice

## 2017-12-30 ENCOUNTER — Other Ambulatory Visit: Payer: Self-pay

## 2017-12-30 MED ORDER — ONDANSETRON HCL 4 MG PO TABS: 4.0000 mg | ORAL_TABLET | Freq: Three times a day (TID) | ORAL | 1 refills | Status: AC | PRN

## 2017-12-30 MED ORDER — ACETAMINOPHEN 500 MG PO TABS: 1000.0000 mg | ORAL_TABLET | Freq: Three times a day (TID) | ORAL | 0 refills | Status: DC

## 2017-12-30 MED ORDER — OXYCODONE HCL 5 MG PO TABS: ORAL_TABLET | ORAL | 0 refills | Status: AC

## 2017-12-30 NOTE — Progress Notes (Signed)
Pt voiding on his own multiple times before discharge. Informed Dr. Griffin Basil and gave the okay to d/c. Pt given discharge instructions and gone over with him and son present. Answered all questions to satisfaction. All belongings gathered to be sent home with pt. Pt in no distress at time of discharge.

## 2017-12-30 NOTE — Evaluation (Signed)
Occupational Therapy Evaluation Patient Details Name: Joseph Simpson MRN: [id number] DOB: May 18, 1935 Today's Date: 12/30/2017    History of Present Illness s/p Right reverse total arthroplasty. PMH includes CAD, HTN OSA, PAC, PVC, SVT   Clinical Impression   Pt admitted with the above diagnoses and presents with below problem list. Pt will benefit from continued acute OT to address the below listed deficits and maximize independence with basic ADLs prior to d/c home with family/friends assisting. PTA pt was independent with basic ADLs. Pt is currently min-mod A with UB/LB ADLs, min guard for functional mobility/transfers. Son present for part of session and included in education.       Follow Up Recommendations  Follow surgeon's recommendation for DC plan and follow-up therapies    Equipment Recommendations  None recommended by OT    Recommendations for Other Services       Precautions / Restrictions Precautions Precautions: Shoulder Shoulder Interventions: Shoulder sling/immobilizer Precaution Booklet Issued: Yes (comment) Required Braces or Orthoses: Sling Restrictions Weight Bearing Restrictions: Yes RUE Weight Bearing: Non weight bearing      Mobility Bed Mobility Overal bed mobility: Needs Assistance Bed Mobility: Supine to Sit     Supine to sit: Min guard     General bed mobility comments: min guard for safety with RUE. Pt plans to sleep in recliner initially  Transfers Overall transfer level: Modified independent               General transfer comment: from EOB, to recliner. takes his time but no physical assist    Balance Overall balance assessment: Needs assistance Sitting-balance support: Feet supported Sitting balance-Leahy Scale: Good     Standing balance support: During functional activity Standing balance-Leahy Scale: Good                             ADL either performed or assessed with clinical judgement   ADL Overall ADL's  : Needs assistance/impaired Eating/Feeding: Set up;Sitting   Grooming: Minimal assistance;Sitting   Upper Body Bathing: Moderate assistance;Sitting   Lower Body Bathing: Moderate assistance;Sit to/from stand   Upper Body Dressing : Moderate assistance;Sitting   Lower Body Dressing: Moderate assistance;Sit to/from stand   Toilet Transfer: Min guard;Ambulation;Comfort height toilet   Toileting- Clothing Manipulation and Hygiene: Min guard;Minimal assistance;Sit to/from stand   Tub/ Shower Transfer: Tub transfer;Minimal assistance;Ambulation   Functional mobility during ADLs: Min guard General ADL Comments: Pt completed bed mobility, in-room functional mobility. Practiced with sling. ADL education discussed.      Vision Baseline Vision/History: Wears glasses       Perception     Praxis      Pertinent Vitals/Pain Pain Assessment: Faces Faces Pain Scale: Hurts little more Pain Location: R shoulder Pain Descriptors / Indicators: Aching;Sore Pain Intervention(s): Limited activity within patient's tolerance;Monitored during session;Repositioned     Hand Dominance Right   Extremity/Trunk Assessment Upper Extremity Assessment Upper Extremity Assessment: RUE deficits/detail RUE Deficits / Details: good ROM e/w/h.  RUE: Unable to fully assess due to immobilization   Lower Extremity Assessment Lower Extremity Assessment: Overall WFL for tasks assessed       Communication Communication Communication: No difficulties   Cognition Arousal/Alertness: Awake/alert Behavior During Therapy: WFL for tasks assessed/performed Overall Cognitive Status: Within Functional Limits for tasks assessed  General Comments       Exercises     Shoulder Instructions      Home Living Family/patient expects to be discharged to:: Private residence Living Arrangements: Alone Available Help at Discharge: Family;Friend(s) Type of Home:  House Home Access: Stairs to enter [employer] of Steps: 2 Entrance Stairs-Rails: None Home Layout: One level;Laundry or work area in [employer] Shower/Tub: Tub/shower unit;Door         Home Equipment: Bedside commode;Walker - 2 wheels;Cane - single point   Additional Comments: Son plans to stay the first day then multiple friends/church members helping out      Prior Functioning/Environment Level of Independence: Independent                 OT Problem List: Impaired balance (sitting and/or standing);Decreased knowledge of use of DME or AE;Decreased knowledge of precautions;Pain;Impaired UE functional use      OT Treatment/Interventions:      OT Goals(Current goals can be found in the care plan section) Acute Rehab OT Goals Patient Stated Goal: ride his slingshot bike OT Goal Formulation: With patient Time For Goal Achievement: 01/06/18 Potential to Achieve Goals: Good ADL Goals Pt Will Perform Upper Body Bathing: with set-up;sitting Pt Will Perform Upper Body Dressing: with modified independence;sitting Pt Will Perform Lower Body Dressing: with modified independence;with supervision;sit to/from stand Pt Will Perform Tub/Shower Transfer: Tub transfer;with supervision;3 in 1 Pt/caregiver will Perform Home Exercise Program: With written HEP provided;Independently  OT Frequency:     Barriers to D/C:            Co-evaluation              AM-PAC PT "6 Clicks" Daily Activity     Outcome Measure Help from another person eating meals?: None Help from another person taking care of personal grooming?: A Little Help from another person toileting, which includes using toliet, bedpan, or urinal?: A Little Help from another person bathing (including washing, rinsing, drying)?: A Lot Help from another person to put on and taking off regular upper body clothing?: A Little Help from another person to put on and taking off regular lower body  clothing?: A Little 6 Click Score: 18   End of Session Equipment Utilized During Treatment: Other (comment)(sling) Nurse Communication: Other (comment)(dizzy/lightheaded)  Activity Tolerance: Other (comment);Patient tolerated treatment well(c/o dizziness/lightheadedness. nursing notified) Patient left: in chair;with call bell/phone within reach;with family/visitor present;Other (comment)(reclined in chair)  OT Visit Diagnosis: Unsteadiness on feet (R26.81);Pain Pain - Right/Left: Right Pain - part of body: Shoulder                Time: 1610-9604 OT Time Calculation (min): 36 min Charges:  OT General Charges $OT Visit: 1 Visit OT Evaluation $OT Eval Low Complexity: 1 Low OT Treatments $Self Care/Home Management : 8-22 mins G-Codes:       Hortencia Pilar 12/30/2017, 10:10 AM

## 2017-12-30 NOTE — Progress Notes (Signed)
Occupational Therapy Treatment Patient Details Name: Joseph Simpson MRN: [id number] DOB: 1935/07/25 Today's Date: 12/30/2017    History of present illness s/p Right reverse total arthroplasty. PMH includes CAD, HTN OSA, PAC, PVC, SVT   OT comments  Pt progressing towards acute OT goals. Focus of session was UB/LB dressing, further sling education, and functional mobility in the room. Precautions and strategies reviewed. Son present and included in session. D/c plan remains appropriate. No further acute OT needs indicated. Pt ready for d/c from OT standpoint.   Follow Up Recommendations  Follow surgeon's recommendation for DC plan and follow-up therapies    Equipment Recommendations  None recommended by OT    Recommendations for Other Services      Precautions / Restrictions Precautions Precautions: Shoulder Shoulder Interventions: Shoulder sling/immobilizer Precaution Booklet Issued: Yes (comment) Precaution Comments: reviewed Required Braces or Orthoses: Sling Restrictions Weight Bearing Restrictions: Yes RUE Weight Bearing: Non weight bearing       Mobility Bed Mobility Overal bed mobility: Needs Assistance Bed Mobility: Supine to Sit     Supine to sit: Min guard     General bed mobility comments: up in chair  Transfers Overall transfer level: Modified independent               General transfer comment: to/from recliner, a little extra time and effort    Balance Overall balance assessment: Needs assistance Sitting-balance support: Feet supported Sitting balance-Leahy Scale: Good     Standing balance support: During functional activity Standing balance-Leahy Scale: Good Standing balance comment: good static, fair dynamic                           ADL either performed or assessed with clinical judgement   ADL Overall ADL's : Needs assistance/impaired Eating/Feeding: Set up;Sitting   Grooming: Minimal assistance;Sitting   Upper Body  Bathing: Moderate assistance;Sitting   Lower Body Bathing: Moderate assistance;Sit to/from stand   Upper Body Dressing : Moderate assistance;Sitting Upper Body Dressing Details (indicate cue type and reason): Pt completed with assist to fully don onto RUE and to transition LUE into sleeve, assist for buttons also.  Lower Body Dressing: Moderate assistance;Sit to/from stand Lower Body Dressing Details (indicate cue type and reason): Assist once in standing to pull up pants on R hip and manage fasteners.  Toilet Transfer: Min guard;Ambulation;Comfort height toilet   Toileting- Clothing Manipulation and Hygiene: Min guard;Minimal assistance;Sit to/from stand   Tub/ Shower Transfer: Tub transfer;Minimal assistance;Ambulation   Functional mobility during ADLs: Min guard General ADL Comments: Pt completed UB/LB dressing, ambulated in room, stood at sink for further sling education.      Vision Baseline Vision/History: Wears glasses     Perception     Praxis      Cognition Arousal/Alertness: Awake/alert Behavior During Therapy: WFL for tasks assessed/performed Overall Cognitive Status: Within Functional Limits for tasks assessed                                          Exercises     Shoulder Instructions       General Comments      Pertinent Vitals/ Pain       Pain Assessment: Faces Faces Pain Scale: Hurts little more Pain Location: R shoulder Pain Descriptors / Indicators: Aching;Sore Pain Intervention(s): Limited activity within patient's tolerance;Monitored during session;Repositioned  Home Living Family/patient  expects to be discharged to:: Private residence Living Arrangements: Alone Available Help at Discharge: Family;Friend(s) Type of Home: House Home Access: Stairs to enter [employer] of Steps: 2 Entrance Stairs-Rails: None Home Layout: One level;Laundry or work area in [employer] Shower/Tub: Tub/shower unit;Door          Home Equipment: Bedside commode;Walker - 2 wheels;Cane - single point   Additional Comments: Son plans to stay the first day then multiple friends/church members helping out      Prior Functioning/Environment Level of Independence: Independent            Frequency  Min 2X/week        Progress Toward Goals  OT Goals(current goals can now be found in the care plan section)  Progress towards OT goals: Progressing toward goals  Acute Rehab OT Goals Patient Stated Goal: ride his slingshot bike OT Goal Formulation: With patient Time For Goal Achievement: 01/06/18 Potential to Achieve Goals: Good ADL Goals Pt Will Perform Upper Body Bathing: with set-up;sitting Pt Will Perform Upper Body Dressing: with modified independence;sitting Pt Will Perform Lower Body Dressing: with modified independence;with supervision;sit to/from stand Pt Will Perform Tub/Shower Transfer: Tub transfer;with supervision;3 in 1 Pt/caregiver will Perform Home Exercise Program: With written HEP provided;Independently  Plan Discharge plan remains appropriate    Co-evaluation                 AM-PAC PT "6 Clicks" Daily Activity     Outcome Measure   Help from another person eating meals?: None Help from another person taking care of personal grooming?: A Little Help from another person toileting, which includes using toliet, bedpan, or urinal?: A Little Help from another person bathing (including washing, rinsing, drying)?: A Lot Help from another person to put on and taking off regular upper body clothing?: A Little Help from another person to put on and taking off regular lower body clothing?: A Little 6 Click Score: 18    End of Session Equipment Utilized During Treatment: Other (comment)(sling)  OT Visit Diagnosis: Unsteadiness on feet (R26.81);Pain Pain - Right/Left: Right Pain - part of body: Shoulder   Activity Tolerance Patient tolerated treatment well   Patient Left in  chair;with call bell/phone within reach;with family/visitor present   Nurse Communication Other (comment)(dizzy/lightheaded)        Time: 4782-9562 OT Time Calculation (min): 34 min  Charges: OT General Charges $OT Visit: 1 Visit OT Evaluation $OT Eval Low Complexity: 1 Low OT Treatments $Self Care/Home Management : 23-37 mins    Hortencia Pilar 12/30/2017, 12:20 PM

## 2017-12-30 NOTE — Discharge Summary (Signed)
Patient ID: Joseph Simpson MRN: [id number] DOB/AGE: October 05, 1935 83 y.o.  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 Abnormal stress test December 2013   s/p cardiac cath with minimal nonobstructive CAD and normal LV function; medical management recommended  . Arthritis   . CAD (coronary artery disease)   . Dyslipidemia   . Dysrhythmia   . GERD (gastroesophageal reflux disease)   . History of hiatal hernia   . History of kidney stones   . Hypertension   . Macular degeneration   . Melanoma ([place])   . Obstructive sleep apnea 09/23/2007   Wore cpap previously. Stopped and not interested despite risks.     . OSA (obstructive sleep apnea)   . PAC (premature atrial contraction) 10/25/2015  . PVC's (premature ventricular contractions) 09/27/2013  . SVT (supraventricular tachycardia) ([place])   . Syncope      Procedures Performed: R reverse total shoulder arthroplasty with augmented wedge baseplate  Discharged Condition: good  Hospital Course: Patient brought in as an outpatient for surgery.  Tolerated procedure well.  Was kept for monitoring overnight for pain control and medical monitoring postop and was found to be stable for DC home the morning after surgery.  He did have some urinary retention thus we were assessing ability to void prior to dc with consideration for leg bag and home trial of void as this had happened before. Patient was instructed on specific activity restrictions and all questions were answered.  Consults: None  Significant Diagnostic Studies: No additional pertinent studies  Treatments: Surgery  Discharge Exam:  Dressing CDI and sling well fitting,  full and painless ROM throughout hand with DPC of 0. + Motor in  AIN, PIN, Ulnar distributions. Axillary nerve sensation  preserved and symmetric.  Sensation intact in medial, radial, and ulnar distributions. Well perfused digits.     Disposition: 01-Home or Self Care  Discharge Instructions    Call MD for:  persistant nausea and vomiting   Complete by:  As directed    Call MD for:  redness, tenderness, or signs of infection (pain, swelling, redness, odor or green/yellow discharge around incision site)   Complete by:  As directed    Call MD for:  severe uncontrolled pain   Complete by:  As directed    Diet - low sodium heart healthy   Complete by:  As directed    Discharge instructions   Complete by:  As directed    Ophelia Charter MD, MPH [place]. [address], [address] [phone] (tel)   [phone] (fax)   [place] may leave the operative dressing in place until your follow-up appointment. KEEP THE INCISIONS CLEAN AND DRY. Use the Cryocuff, GameReady or Ice as often as possible for the first 3-4 days, then as needed for pain relief.  You may shower on Post-Op Day #2. The dressing is water resistant but do not scrub it as it may start to peel up.  You may remove the sling for showering, but keep a water resistant pillow under the arm to keep both the elbow and shoulder away from the body (mimicking the abduction sling). Gently pat the area dry. Do not soak the shoulder in water. Do not go swimming in the pool or ocean  until your sutures are removed.  EXERCISES Wear the sling at all times except when doing your exercises. You may remove the sling for showering, but keep the arm across the chest or in a secondary sling.   Accidental/Purposeful External Rotation and shoulder flexion (reaching behind you) is to be avoided at all costs for the first month. Please perform the exercises:   Elbow / Hand / Wrist  Range of Motion Exercises  POST-OP A multi-modal approach will be used to treat your pain. Oxycodone -  This is a strong narcotic, to be used only on an "as needed" basis for pain. Acetaminophen - A non-narcotic pain medicine.  Use 1000mg  three times a day for the first 14 days after surgery If you have any adverse effects with the medications, please call our office.  FOLLOW-UP If you develop a Fever (>101.5), Redness or Drainage from the surgical incision site, please call our office to arrange for an evaluation. Please call the office to schedule a follow-up appointment for a wound check, 7-10 days post-operatively.    IF YOU HAVE ANY QUESTIONS, PLEASE FEEL FREE TO CALL OUR OFFICE.   HELPFUL INFORMATION  Your arm will be in a sling following surgery. You will be in this sling for the next 3-4 weeks.  I will let you know the exact duration at your follow-up visit.  You may be more comfortable sleeping in a semi-seated position the first few nights following surgery.  Keep a pillow propped under the elbow and forearm for comfort.  If you have a recliner type of chair it might be beneficial.  If not that is fine too, but it would be helpful to sleep propped up with pillows behind your operated shoulder as well under your elbow and forearm.  This will reduce pulling on the suture lines.  We suggest you use the pain medication the first night prior to going to bed, in order to ease any pain when the anesthesia wears off. You should avoid taking pain medications on an empty stomach as it will make you nauseous.  Do not drink alcoholic beverages or take illicit drugs when taking pain medications.  In most states it is against the law to drive while your arm is in a sling. And certainly against the law to drive while taking narcotics.  You may return to work/school in the next couple of days when you feel up to it. Desk work and typing in the sling is fine.  When dressing, put your operative arm in the sleeve first.  When getting undressed, take your operative arm out last.  Loose fitting,  button-down shirts are recommended.  Pain medication may make you constipated.  Below are a few solutions to try in this order: Decrease the amount of pain medication if you aren't having pain. Drink lots of decaffeinated fluids. Drink prune juice and/or each dried prunes  If the first 3 don't work start with additional solutions Take Colace - an over-the-counter stool softener Take Senokot - an over-the-counter laxative Take Miralax - a stronger over-the-counter laxative   Increase activity slowly   Complete by:  As directed      Allergies as of 12/30/2017      Reactions   Aspirin Other (See Comments)   UPSET STOMACH   Percocet [oxycodone-acetaminophen] Nausea And Vomiting   Vitamins [apatate] Other (See Comments)   Bothers stomach      Medication List    STOP taking these medications   TYLENOL 8 HOUR  ARTHRITIS PAIN 650 MG CR tablet Generic drug:  acetaminophen Replaced by:  acetaminophen 500 MG tablet     TAKE these medications   acetaminophen 500 MG tablet Commonly known as:  TYLENOL Take 2 tablets (1,000 mg total) by mouth every 8 (eight) hours for 14 days. Replaces:  TYLENOL 8 HOUR ARTHRITIS PAIN 650 MG CR tablet   albuterol 108 (90 Base) MCG/ACT inhaler Commonly known as:  PROAIR HFA Inhale 2 puffs into the lungs every 6 (six) hours as needed for wheezing or shortness of breath.   amLODipine 2.5 MG tablet Commonly known as:  NORVASC Take 1 tablet (2.5 mg total) by mouth daily. What changed:  when to take this   atorvastatin 10 MG tablet Commonly known as:  LIPITOR Take 1 tablet (10 mg total) by mouth daily.   bevacizumab 2.5 mg/0.1 mL Soln Commonly known as:  AVASTIN 2.5 mg by Intravitreal route. Injection to eye every 8 weeks - use with vigamox   losartan 100 MG tablet Commonly known as:  COZAAR Take 0.5 tablets (50 mg total) by mouth 2 (two) times daily.   metoCLOPramide 10 MG tablet Commonly known as:  REGLAN Take 5 mg by mouth 2 (two) times  daily.   metoprolol tartrate 25 MG tablet Commonly known as:  LOPRESSOR Take 1 tablet (25 mg total) by mouth 2 (two) times daily.   ondansetron 4 MG tablet Commonly known as:  ZOFRAN Take 1 tablet (4 mg total) by mouth every 8 (eight) hours as needed for up to 7 days for nausea or vomiting.   oxyCODONE 5 MG immediate release tablet Commonly known as:  Oxy IR/ROXICODONE Take 1-2 pills every 6 hrs as needed for pain   pantoprazole 40 MG tablet Commonly known as:  PROTONIX Take 40 mg by mouth 2 (two) times daily.   SYSTANE ULTRA 0.4-0.3 % Soln Generic drug:  Polyethyl Glycol-Propyl Glycol Place 1 drop into both eyes 3 (three) times daily.   tamsulosin 0.4 MG Caps capsule Commonly known as:  FLOMAX Take 0.4 mg by mouth daily as needed (for urinary symptoms).   VIGAMOX 0.5 % ophthalmic solution Generic drug:  moxifloxacin Apply 1 drop to eye as directed. Take every 8 weeks as directed - use with avastin

## 2017-12-30 NOTE — Plan of Care (Signed)
  Education: Knowledge of General Education information will improve 12/30/2017 0527 - Progressing by Anson Fret, RN Note POC reviewed with pt.

## 2017-12-30 NOTE — Progress Notes (Signed)
Pt. Having problems voiding; bladder scan and I&O cath around 2100; then since that time voiding 100cc at a time 200cc void prior to bladder scan 0138 550cc, then void 100cc; MD paged and Merrily Pew, PA on call and RTC and informed; orders given.

## 2017-12-31 ENCOUNTER — Encounter (HOSPITAL_COMMUNITY): Payer: Self-pay | Admitting: Orthopaedic Surgery

## 2018-01-04 ENCOUNTER — Encounter (INDEPENDENT_AMBULATORY_CARE_PROVIDER_SITE_OTHER): Payer: Medicare Other | Admitting: Ophthalmology

## 2018-01-04 DIAGNOSIS — H33301 Unspecified retinal break, right eye: Secondary | ICD-10-CM

## 2018-01-04 DIAGNOSIS — H43813 Vitreous degeneration, bilateral: Secondary | ICD-10-CM

## 2018-01-04 DIAGNOSIS — H353231 Exudative age-related macular degeneration, bilateral, with active choroidal neovascularization: Secondary | ICD-10-CM

## 2018-01-04 DIAGNOSIS — D3132 Benign neoplasm of left choroid: Secondary | ICD-10-CM | POA: Diagnosis not present

## 2018-01-04 DIAGNOSIS — I10 Essential (primary) hypertension: Secondary | ICD-10-CM

## 2018-01-04 DIAGNOSIS — H35033 Hypertensive retinopathy, bilateral: Secondary | ICD-10-CM | POA: Diagnosis not present

## 2018-01-06 DIAGNOSIS — M19011 Primary osteoarthritis, right shoulder: Secondary | ICD-10-CM | POA: Diagnosis not present

## 2018-01-12 DIAGNOSIS — Z96611 Presence of right artificial shoulder joint: Secondary | ICD-10-CM | POA: Diagnosis not present

## 2018-01-12 DIAGNOSIS — M25511 Pain in right shoulder: Secondary | ICD-10-CM | POA: Diagnosis not present

## 2018-01-12 DIAGNOSIS — M25611 Stiffness of right shoulder, not elsewhere classified: Secondary | ICD-10-CM | POA: Diagnosis not present

## 2018-01-12 DIAGNOSIS — M6281 Muscle weakness (generalized): Secondary | ICD-10-CM | POA: Diagnosis not present

## 2018-01-18 DIAGNOSIS — M25511 Pain in right shoulder: Secondary | ICD-10-CM | POA: Diagnosis not present

## 2018-01-18 DIAGNOSIS — Z96611 Presence of right artificial shoulder joint: Secondary | ICD-10-CM | POA: Diagnosis not present

## 2018-01-18 DIAGNOSIS — M25611 Stiffness of right shoulder, not elsewhere classified: Secondary | ICD-10-CM | POA: Diagnosis not present

## 2018-01-18 DIAGNOSIS — M6281 Muscle weakness (generalized): Secondary | ICD-10-CM | POA: Diagnosis not present

## 2018-01-20 DIAGNOSIS — M6281 Muscle weakness (generalized): Secondary | ICD-10-CM | POA: Diagnosis not present

## 2018-01-20 DIAGNOSIS — Z96611 Presence of right artificial shoulder joint: Secondary | ICD-10-CM | POA: Diagnosis not present

## 2018-01-20 DIAGNOSIS — M25511 Pain in right shoulder: Secondary | ICD-10-CM | POA: Diagnosis not present

## 2018-01-20 DIAGNOSIS — M25611 Stiffness of right shoulder, not elsewhere classified: Secondary | ICD-10-CM | POA: Diagnosis not present

## 2018-01-25 DIAGNOSIS — M6281 Muscle weakness (generalized): Secondary | ICD-10-CM | POA: Diagnosis not present

## 2018-01-25 DIAGNOSIS — M25511 Pain in right shoulder: Secondary | ICD-10-CM | POA: Diagnosis not present

## 2018-01-25 DIAGNOSIS — M25611 Stiffness of right shoulder, not elsewhere classified: Secondary | ICD-10-CM | POA: Diagnosis not present

## 2018-01-25 DIAGNOSIS — Z96611 Presence of right artificial shoulder joint: Secondary | ICD-10-CM | POA: Diagnosis not present

## 2018-01-27 DIAGNOSIS — M25511 Pain in right shoulder: Secondary | ICD-10-CM | POA: Diagnosis not present

## 2018-01-28 DIAGNOSIS — M6281 Muscle weakness (generalized): Secondary | ICD-10-CM | POA: Diagnosis not present

## 2018-01-28 DIAGNOSIS — Z96611 Presence of right artificial shoulder joint: Secondary | ICD-10-CM | POA: Diagnosis not present

## 2018-01-28 DIAGNOSIS — M25511 Pain in right shoulder: Secondary | ICD-10-CM | POA: Diagnosis not present

## 2018-01-28 DIAGNOSIS — M25611 Stiffness of right shoulder, not elsewhere classified: Secondary | ICD-10-CM | POA: Diagnosis not present

## 2018-02-01 DIAGNOSIS — M25511 Pain in right shoulder: Secondary | ICD-10-CM | POA: Diagnosis not present

## 2018-02-01 DIAGNOSIS — M25611 Stiffness of right shoulder, not elsewhere classified: Secondary | ICD-10-CM | POA: Diagnosis not present

## 2018-02-01 DIAGNOSIS — Z96611 Presence of right artificial shoulder joint: Secondary | ICD-10-CM | POA: Diagnosis not present

## 2018-02-01 DIAGNOSIS — M6281 Muscle weakness (generalized): Secondary | ICD-10-CM | POA: Diagnosis not present

## 2018-02-03 DIAGNOSIS — M6281 Muscle weakness (generalized): Secondary | ICD-10-CM | POA: Diagnosis not present

## 2018-02-03 DIAGNOSIS — M25511 Pain in right shoulder: Secondary | ICD-10-CM | POA: Diagnosis not present

## 2018-02-03 DIAGNOSIS — M25611 Stiffness of right shoulder, not elsewhere classified: Secondary | ICD-10-CM | POA: Diagnosis not present

## 2018-02-03 DIAGNOSIS — Z96611 Presence of right artificial shoulder joint: Secondary | ICD-10-CM | POA: Diagnosis not present

## 2018-02-07 DIAGNOSIS — M25611 Stiffness of right shoulder, not elsewhere classified: Secondary | ICD-10-CM | POA: Diagnosis not present

## 2018-02-07 DIAGNOSIS — M6281 Muscle weakness (generalized): Secondary | ICD-10-CM | POA: Diagnosis not present

## 2018-02-07 DIAGNOSIS — Z96611 Presence of right artificial shoulder joint: Secondary | ICD-10-CM | POA: Diagnosis not present

## 2018-02-07 DIAGNOSIS — M25511 Pain in right shoulder: Secondary | ICD-10-CM | POA: Diagnosis not present

## 2018-02-09 DIAGNOSIS — M25611 Stiffness of right shoulder, not elsewhere classified: Secondary | ICD-10-CM | POA: Diagnosis not present

## 2018-02-09 DIAGNOSIS — M25511 Pain in right shoulder: Secondary | ICD-10-CM | POA: Diagnosis not present

## 2018-02-09 DIAGNOSIS — M6281 Muscle weakness (generalized): Secondary | ICD-10-CM | POA: Diagnosis not present

## 2018-02-09 DIAGNOSIS — Z96611 Presence of right artificial shoulder joint: Secondary | ICD-10-CM | POA: Diagnosis not present

## 2018-02-14 DIAGNOSIS — M6281 Muscle weakness (generalized): Secondary | ICD-10-CM | POA: Diagnosis not present

## 2018-02-14 DIAGNOSIS — Z96611 Presence of right artificial shoulder joint: Secondary | ICD-10-CM | POA: Diagnosis not present

## 2018-02-14 DIAGNOSIS — M25511 Pain in right shoulder: Secondary | ICD-10-CM | POA: Diagnosis not present

## 2018-02-14 DIAGNOSIS — M25611 Stiffness of right shoulder, not elsewhere classified: Secondary | ICD-10-CM | POA: Diagnosis not present

## 2018-02-15 ENCOUNTER — Encounter (INDEPENDENT_AMBULATORY_CARE_PROVIDER_SITE_OTHER): Payer: Medicare Other | Admitting: Ophthalmology

## 2018-02-15 DIAGNOSIS — H33301 Unspecified retinal break, right eye: Secondary | ICD-10-CM | POA: Diagnosis not present

## 2018-02-15 DIAGNOSIS — I10 Essential (primary) hypertension: Secondary | ICD-10-CM | POA: Diagnosis not present

## 2018-02-15 DIAGNOSIS — H43813 Vitreous degeneration, bilateral: Secondary | ICD-10-CM

## 2018-02-15 DIAGNOSIS — H353231 Exudative age-related macular degeneration, bilateral, with active choroidal neovascularization: Secondary | ICD-10-CM

## 2018-02-15 DIAGNOSIS — D3132 Benign neoplasm of left choroid: Secondary | ICD-10-CM | POA: Diagnosis not present

## 2018-02-15 DIAGNOSIS — H35033 Hypertensive retinopathy, bilateral: Secondary | ICD-10-CM | POA: Diagnosis not present

## 2018-02-16 DIAGNOSIS — Z96611 Presence of right artificial shoulder joint: Secondary | ICD-10-CM | POA: Diagnosis not present

## 2018-02-16 DIAGNOSIS — M25511 Pain in right shoulder: Secondary | ICD-10-CM | POA: Diagnosis not present

## 2018-02-16 DIAGNOSIS — M25611 Stiffness of right shoulder, not elsewhere classified: Secondary | ICD-10-CM | POA: Diagnosis not present

## 2018-02-16 DIAGNOSIS — M6281 Muscle weakness (generalized): Secondary | ICD-10-CM | POA: Diagnosis not present

## 2018-02-21 DIAGNOSIS — Z96611 Presence of right artificial shoulder joint: Secondary | ICD-10-CM | POA: Diagnosis not present

## 2018-02-21 DIAGNOSIS — M6281 Muscle weakness (generalized): Secondary | ICD-10-CM | POA: Diagnosis not present

## 2018-02-21 DIAGNOSIS — M25511 Pain in right shoulder: Secondary | ICD-10-CM | POA: Diagnosis not present

## 2018-02-21 DIAGNOSIS — M25611 Stiffness of right shoulder, not elsewhere classified: Secondary | ICD-10-CM | POA: Diagnosis not present

## 2018-02-23 DIAGNOSIS — Z96611 Presence of right artificial shoulder joint: Secondary | ICD-10-CM | POA: Diagnosis not present

## 2018-02-23 DIAGNOSIS — M6281 Muscle weakness (generalized): Secondary | ICD-10-CM | POA: Diagnosis not present

## 2018-02-23 DIAGNOSIS — M25511 Pain in right shoulder: Secondary | ICD-10-CM | POA: Diagnosis not present

## 2018-02-23 DIAGNOSIS — M25611 Stiffness of right shoulder, not elsewhere classified: Secondary | ICD-10-CM | POA: Diagnosis not present

## 2018-02-28 DIAGNOSIS — M25511 Pain in right shoulder: Secondary | ICD-10-CM | POA: Diagnosis not present

## 2018-02-28 DIAGNOSIS — M25611 Stiffness of right shoulder, not elsewhere classified: Secondary | ICD-10-CM | POA: Diagnosis not present

## 2018-02-28 DIAGNOSIS — Z96611 Presence of right artificial shoulder joint: Secondary | ICD-10-CM | POA: Diagnosis not present

## 2018-02-28 DIAGNOSIS — M6281 Muscle weakness (generalized): Secondary | ICD-10-CM | POA: Diagnosis not present

## 2018-03-02 DIAGNOSIS — Z86010 Personal history of colonic polyps: Secondary | ICD-10-CM | POA: Diagnosis not present

## 2018-03-02 DIAGNOSIS — K219 Gastro-esophageal reflux disease without esophagitis: Secondary | ICD-10-CM | POA: Diagnosis not present

## 2018-03-02 DIAGNOSIS — K3184 Gastroparesis: Secondary | ICD-10-CM | POA: Diagnosis not present

## 2018-03-03 DIAGNOSIS — M6281 Muscle weakness (generalized): Secondary | ICD-10-CM | POA: Diagnosis not present

## 2018-03-03 DIAGNOSIS — Z96611 Presence of right artificial shoulder joint: Secondary | ICD-10-CM | POA: Diagnosis not present

## 2018-03-03 DIAGNOSIS — M25511 Pain in right shoulder: Secondary | ICD-10-CM | POA: Diagnosis not present

## 2018-03-03 DIAGNOSIS — M25611 Stiffness of right shoulder, not elsewhere classified: Secondary | ICD-10-CM | POA: Diagnosis not present

## 2018-03-14 DIAGNOSIS — M25511 Pain in right shoulder: Secondary | ICD-10-CM | POA: Diagnosis not present

## 2018-03-14 DIAGNOSIS — Z96611 Presence of right artificial shoulder joint: Secondary | ICD-10-CM | POA: Diagnosis not present

## 2018-03-14 DIAGNOSIS — M6281 Muscle weakness (generalized): Secondary | ICD-10-CM | POA: Diagnosis not present

## 2018-03-14 DIAGNOSIS — M25611 Stiffness of right shoulder, not elsewhere classified: Secondary | ICD-10-CM | POA: Diagnosis not present

## 2018-03-16 DIAGNOSIS — M25511 Pain in right shoulder: Secondary | ICD-10-CM | POA: Diagnosis not present

## 2018-03-16 DIAGNOSIS — M6281 Muscle weakness (generalized): Secondary | ICD-10-CM | POA: Diagnosis not present

## 2018-03-16 DIAGNOSIS — Z96611 Presence of right artificial shoulder joint: Secondary | ICD-10-CM | POA: Diagnosis not present

## 2018-03-16 DIAGNOSIS — M25611 Stiffness of right shoulder, not elsewhere classified: Secondary | ICD-10-CM | POA: Diagnosis not present

## 2018-03-21 DIAGNOSIS — M25511 Pain in right shoulder: Secondary | ICD-10-CM | POA: Diagnosis not present

## 2018-03-21 DIAGNOSIS — M6281 Muscle weakness (generalized): Secondary | ICD-10-CM | POA: Diagnosis not present

## 2018-03-21 DIAGNOSIS — M25611 Stiffness of right shoulder, not elsewhere classified: Secondary | ICD-10-CM | POA: Diagnosis not present

## 2018-03-21 DIAGNOSIS — Z96611 Presence of right artificial shoulder joint: Secondary | ICD-10-CM | POA: Diagnosis not present

## 2018-03-23 DIAGNOSIS — M25511 Pain in right shoulder: Secondary | ICD-10-CM | POA: Diagnosis not present

## 2018-03-23 DIAGNOSIS — M6281 Muscle weakness (generalized): Secondary | ICD-10-CM | POA: Diagnosis not present

## 2018-03-23 DIAGNOSIS — M25611 Stiffness of right shoulder, not elsewhere classified: Secondary | ICD-10-CM | POA: Diagnosis not present

## 2018-03-23 DIAGNOSIS — Z96611 Presence of right artificial shoulder joint: Secondary | ICD-10-CM | POA: Diagnosis not present

## 2018-03-28 DIAGNOSIS — Z96611 Presence of right artificial shoulder joint: Secondary | ICD-10-CM | POA: Diagnosis not present

## 2018-03-28 DIAGNOSIS — M25611 Stiffness of right shoulder, not elsewhere classified: Secondary | ICD-10-CM | POA: Diagnosis not present

## 2018-03-28 DIAGNOSIS — M25511 Pain in right shoulder: Secondary | ICD-10-CM | POA: Diagnosis not present

## 2018-03-28 DIAGNOSIS — M6281 Muscle weakness (generalized): Secondary | ICD-10-CM | POA: Diagnosis not present

## 2018-03-29 ENCOUNTER — Encounter (INDEPENDENT_AMBULATORY_CARE_PROVIDER_SITE_OTHER): Payer: Medicare Other | Admitting: Ophthalmology

## 2018-03-29 DIAGNOSIS — D3132 Benign neoplasm of left choroid: Secondary | ICD-10-CM

## 2018-03-29 DIAGNOSIS — H43813 Vitreous degeneration, bilateral: Secondary | ICD-10-CM | POA: Diagnosis not present

## 2018-03-29 DIAGNOSIS — H33301 Unspecified retinal break, right eye: Secondary | ICD-10-CM | POA: Diagnosis not present

## 2018-03-29 DIAGNOSIS — I10 Essential (primary) hypertension: Secondary | ICD-10-CM | POA: Diagnosis not present

## 2018-03-29 DIAGNOSIS — H353231 Exudative age-related macular degeneration, bilateral, with active choroidal neovascularization: Secondary | ICD-10-CM

## 2018-03-29 DIAGNOSIS — H35033 Hypertensive retinopathy, bilateral: Secondary | ICD-10-CM

## 2018-03-30 DIAGNOSIS — Z96611 Presence of right artificial shoulder joint: Secondary | ICD-10-CM | POA: Diagnosis not present

## 2018-03-30 DIAGNOSIS — M6281 Muscle weakness (generalized): Secondary | ICD-10-CM | POA: Diagnosis not present

## 2018-03-30 DIAGNOSIS — M25511 Pain in right shoulder: Secondary | ICD-10-CM | POA: Diagnosis not present

## 2018-03-30 DIAGNOSIS — M25611 Stiffness of right shoulder, not elsewhere classified: Secondary | ICD-10-CM | POA: Diagnosis not present

## 2018-03-31 DIAGNOSIS — M25511 Pain in right shoulder: Secondary | ICD-10-CM | POA: Diagnosis not present

## 2018-04-08 ENCOUNTER — Other Ambulatory Visit: Payer: Self-pay | Admitting: Family Medicine

## 2018-04-20 DIAGNOSIS — L821 Other seborrheic keratosis: Secondary | ICD-10-CM | POA: Diagnosis not present

## 2018-04-20 DIAGNOSIS — L570 Actinic keratosis: Secondary | ICD-10-CM | POA: Diagnosis not present

## 2018-04-20 DIAGNOSIS — D2271 Melanocytic nevi of right lower limb, including hip: Secondary | ICD-10-CM | POA: Diagnosis not present

## 2018-04-20 DIAGNOSIS — Z87898 Personal history of other specified conditions: Secondary | ICD-10-CM | POA: Diagnosis not present

## 2018-04-20 DIAGNOSIS — D1800 Hemangioma unspecified site: Secondary | ICD-10-CM | POA: Diagnosis not present

## 2018-04-20 DIAGNOSIS — L814 Other melanin hyperpigmentation: Secondary | ICD-10-CM | POA: Diagnosis not present

## 2018-04-20 DIAGNOSIS — D2230 Melanocytic nevi of unspecified part of face: Secondary | ICD-10-CM | POA: Diagnosis not present

## 2018-05-05 ENCOUNTER — Ambulatory Visit: Payer: Self-pay | Admitting: Family Medicine

## 2018-05-05 ENCOUNTER — Encounter: Payer: Self-pay | Admitting: Family Medicine

## 2018-05-05 ENCOUNTER — Ambulatory Visit (INDEPENDENT_AMBULATORY_CARE_PROVIDER_SITE_OTHER): Payer: Medicare Other | Admitting: Family Medicine

## 2018-05-05 VITALS — BP 136/86 | HR 69 | Temp 98.5°F | Ht 73.0 in | Wt 181.0 lb

## 2018-05-05 DIAGNOSIS — R1900 Intra-abdominal and pelvic swelling, mass and lump, unspecified site: Secondary | ICD-10-CM

## 2018-05-05 DIAGNOSIS — R079 Chest pain, unspecified: Secondary | ICD-10-CM

## 2018-05-05 NOTE — Patient Instructions (Signed)
It was very nice to see you today!  The bulge you were having on your abdomen could possibly be due a new hernia.  I do not see anything that would be of major concern based on your exam today.  We can continue to watch this over the next few weeks.  Please let me know or let Dr. Yong Channel know if you start having significant abdominal pain, nausea, vomiting, constipation, or diarrhea.  Your EKG today shows skipped beats but no signs of any blockages.  Please seek medical care if you have recurrence of your chest pain.  Take care, Dr Jerline Pain

## 2018-05-05 NOTE — Telephone Encounter (Signed)
I returned his call.   Yesterday morning he started having this "heartbeat in stomach about every 5 minutes". " It's in the right upper stomach not under my ribs but in that area"   "It's soft and I can push it in".   He denies any pain, nausea, vomiting or diarrhea.   He mentioned he had stomach surgery 35 years ago but has had no problems since.   He does take Reglan and Protonix for acid reflux that helps.   He said he has been on these medications for many years.  "The bulge pushes out against my shirt about every 5 minutes.   When asked if straining makes it bigger he said he had not noticed it does but he has not lifted anything heavy or done anything that requires straining.  He also mentioned he had some chest pain 2 days ago.   See chest pain triage notes for complete assessment.   He denies having any more chest pains since the occurrence 2 days ago.  I was attempting to schedule him with Dr. [name] because Dr. Yong Channel was booked for today and tomorrow.   I was trying to get him into a same day appt with her but wasn't able to schedule it in the computer even though it showed she had an open slot.   I called the flow coordinator.   She checked with Dr. Ansel Bong nurse who checked with Dr. Yong Channel.   He read my triage notes and had further questions regarding the chest pain.   He requested more information so I triaged the pt using the chest pain protocol.   I then put the pt on hold and called the flow coordinator back for further directions from Dr. Yong Channel.   Lanny Hurst had talked with me prior but "he has just left for lunch".   The lady put me in  Touch with Roselyn Reef.   I informed her of the whole situation and I was requesting further directions from Dr. Yong Channel as to whether to send this pt to the ED or if they were willing to see him in the office.  Roselyn Reef checked with Dr. Yong Channel.  He was booked for today but Dr. Jerline Pain was willing to see him.   So Roselyn Reef scheduled the pt to see Dr. Jerline Pain today at  4:20PM.    They requested he be at the office by 4:00. Roselyn Reef asked me to inform the pt if he had chest pain of any kind to go to the ED.    I informed the pt of the 4:20 appt with Dr. Jerline Pain today.   I asked him to arrive by 4:00 and he acknowledged he would do that.   I also instructed him to go to the ED if he experienced chest pain/discomfort of any kind, shortness of breath, sweating, discomfort down either one of his arms, neck, back or jaw or just not feeling right to go to the ED NOT wait for his appt with Dr. Jerline Pain.   He verbalized understanding of these instructions.   Reason for Disposition . [1] New-onset hernia suspected (reducible bulge in groin or abdomen; non-tender) AND [2] NO pain or vomiting . Patient sounds very sick or weak to the triager  Answer Assessment - Initial Assessment Questions 1. ONSET:  "When did this first appear?"     Yesterday morning. 2. APPEARANCE: "What does it look like?"     It looks like a heart beat in the right upper part  of my stomach that protrudes out even against my shirt.   It in intermittent maybe every 5 minutes.   No pain.  No N/V/D.   Had surgery 35 years go on my stomach.It's soft.  I can push it in.   3. SIZE: "How big is it?" (inches, cm or compare to coins, fruit)     I can just feel it.  Maybe the size of a golf ball. 4. LOCATION: "Where exactly is the hernia located?"     It's on the right side of my upper stomach not under my ribs but in that area.   I had stomach surgery 35 years ago but it was lower on my stomach than this. 5. PATTERN: "Does the swelling come and go, or has it been constant since it started?"     It comes and goes.  Maybe every 5 minutes or so. 6. PAIN: "Is there any pain?" If so, ask: "How bad is it?"  (Scale 1-10; or mild, moderate, severe)     No pain.     7. DIAGNOSIS: "Have you been seen by a doctor for this?" "Did the doctor diagnose you as having a hernia?"     I take BP medication.   It's controlled.   I  had surgery 35 years ago. 8. OTHER SYMPTOMS: "Do you have any other symptoms?" (e.g., fever, abdominal pain, vomiting)     No other symptoms.   2 days ago I had some pain in my chest about every 15 minutes for a day then it went away.   I have GERD so I take Reglan Protonix for it since my surgery 35 years ago.    Had heart cath done 7-8 years ago and everything was clear. 9. PREGNANCY: "Is there any chance you are pregnant?" "When was your last menstrual period?"     N/A  Answer Assessment - Initial Assessment Questions 1. LOCATION: "Where does it hurt?"       Middle of my chest.  It only lasted half a day 2 days ago.   2. RADIATION: "Does the pain go anywhere else?" (e.g., into neck, jaw, arms, back)     No   Just in my chest.   Just a little sharp that lasted a few seconds then went away.    Maybe 10-15 minutes it would happen again.   Happened 15-20 times. 3. ONSET: "When did the chest pain begin?" (Minutes, hours or days)      2 days ago.   No chest pain since that one day 2 days ago. 4. PATTERN "Does the pain come and go, or has it been constant since it started?"   It was intermittent.   See above.        5. DURATION: "How long does it last" (e.g., seconds, minutes, hours)     Last about  10-15 minutes at a time. 6. SEVERITY: "How bad is the pain?"  (e.g., Scale 1-10; mild, moderate, or severe)    - MILD (1-3): doesn't interfere with normal activities     - MODERATE (4-7): interferes with normal activities or awakens from sleep    - SEVERE (8-10): excruciating pain, unable to do any normal activities       Maybe 1 on a pain scale.    7. CARDIAC RISK FACTORS: "Do you have any history of heart problems or risk factors for heart disease?" (e.g., prior heart attack, angina; high blood pressure, diabetes, being overweight, high cholesterol, smoking, or strong  family history of heart disease)     Seen a cardiologist due to skipped beats that I've had for several years.    Dr. Yong Channel is aware  of that.   I take a pill for it.   Dr. [place] said I was fine. 8. PULMONARY RISK FACTORS: "Do you have any history of lung disease?"  (e.g., blood clots in lung, asthma, emphysema, birth control pills)     None of the above. 9. CAUSE: "What do you think is causing the chest pain?"     No.   It just started and lasted 4-5 hours.   I wasn't doing anything strenuous.   It started after I got up.   It was hurting before breakfast.  I went  out for breakfast and I could feel it a little bit.   I went away about 12:00PM.   I didn't have any more pain after 12:00 that day or since then. 10. OTHER SYMPTOMS: "Do you have any other symptoms?" (e.g., dizziness, nausea, vomiting, sweating, fever, difficulty breathing, cough)       No symptoms with the chest pains.   11. PREGNANCY: "Is there any chance you are pregnant?" "When was your last menstrual period?"       N/A  Protocols used: HERNIA-A-AH, CHEST PAIN-A-AH

## 2018-05-05 NOTE — Telephone Encounter (Signed)
Noted.  Patient here in the office now.

## 2018-05-05 NOTE — Progress Notes (Signed)
   Subjective:  Joseph Simpson is a 83 y.o. male who presents today for same-day appointment with a chief complaint of abdominal mass.   HPI:  Abdominal Mass, acute problem Started yesterday morning. Located in right upper abdomen.  Patient is concerned that he has had a recurrence of his hernia.  Patient has had a hernia repair about 40 years ago and has not had any issues since then.  Denies any pain to the area.  No nausea or vomiting.  No obvious precipitating events.  No obvious alleviating or aggravating factors.  No specific treatments tried.  Will occasionally have a "pulsating" sensation that lasts for a few moments every 5 to 10 minutes.  Chest pain, new problem Patient had one episode of chest pain that lasted for a few moments a couple of days ago.  Pain located in substernal chest.  Resolve spontaneously.  Has not had any episodes of chest pain or shortness of breath since then.  ROS: Per HPI  PMH: He reports that he quit smoking about 61 years ago. His smoking use included cigarettes. He has a 0.40 pack-year smoking history. He has never used smokeless tobacco. He reports that he does not drink alcohol or use drugs.  Objective:  Physical Exam: BP 136/86 (BP Location: Left Arm, Patient Position: Sitting, Cuff Size: Normal)   Pulse 69   Temp 98.5 F (36.9 C) (Oral)   Ht 6\' 1"  (1.854 m)   Wt 181 lb (82.1 kg)   SpO2 95%   BMI 23.88 kg/m   Gen: NAD, resting comfortably CV: RRR with no murmurs appreciated Pulm: NWOB, CTAB with no crackles, wheezes, or rhonchi GI: Midline surgical scar noted.  Normoactive bowel sounds throughout.  No obvious masses or hernias noted.  Nontender to palpation.  Small bulge noted to right upper quadrant of his abdominal wall.  Nondistended.  No rebound or guarding.  EKG: Normal sinus rhythm.  Frequent PAC (This is a chronic finding).  No acute ischemic changes.  Assessment/Plan:  Abdominal wall bulge No red flag signs or symptoms.  Do not think  that this represents a hernia given lack of abdominal wall defect on his physical exam with Valsalva maneuver.  It is possible he could be having a muscular spasm, however this is less likely given his lack of pain.  We will continue with watchful waiting for now.  Discussed reasons to return to care sooner including development of severe abdominal pain, nausea, vomiting, constipation, or diarrhea.  Chest pain Atypical pain by history.  No red flag signs or symptoms.  Has not had any bouts of active chest pain or shortness of breath for the past several days.  EKG today without any signs of acute ischemic changes.  Do not need further work-up at this point.  Discussed reasons to return to care or seek emergent medical care including chest pain and shortness of breath.  Patient will be following up with his cardiologist about 6 months.  Algis Greenhouse. Jerline Pain, MD 05/05/2018 4:23 PM

## 2018-05-05 NOTE — Telephone Encounter (Signed)
See note

## 2018-05-10 ENCOUNTER — Encounter (INDEPENDENT_AMBULATORY_CARE_PROVIDER_SITE_OTHER): Payer: Medicare Other | Admitting: Ophthalmology

## 2018-05-10 DIAGNOSIS — H43813 Vitreous degeneration, bilateral: Secondary | ICD-10-CM

## 2018-05-10 DIAGNOSIS — H353231 Exudative age-related macular degeneration, bilateral, with active choroidal neovascularization: Secondary | ICD-10-CM

## 2018-05-10 DIAGNOSIS — I10 Essential (primary) hypertension: Secondary | ICD-10-CM

## 2018-05-10 DIAGNOSIS — H33301 Unspecified retinal break, right eye: Secondary | ICD-10-CM

## 2018-05-10 DIAGNOSIS — D3132 Benign neoplasm of left choroid: Secondary | ICD-10-CM

## 2018-05-10 DIAGNOSIS — H35033 Hypertensive retinopathy, bilateral: Secondary | ICD-10-CM

## 2018-06-01 ENCOUNTER — Other Ambulatory Visit: Payer: Self-pay | Admitting: Family Medicine

## 2018-06-03 ENCOUNTER — Ambulatory Visit: Payer: Medicare Other | Admitting: *Deleted

## 2018-06-09 ENCOUNTER — Encounter: Payer: Self-pay | Admitting: Family Medicine

## 2018-06-09 ENCOUNTER — Ambulatory Visit: Payer: Medicare Other | Admitting: *Deleted

## 2018-06-09 ENCOUNTER — Ambulatory Visit (INDEPENDENT_AMBULATORY_CARE_PROVIDER_SITE_OTHER): Payer: Medicare Other | Admitting: Family Medicine

## 2018-06-09 VITALS — BP 136/78 | HR 69 | Temp 98.5°F | Ht 73.0 in | Wt 182.2 lb

## 2018-06-09 DIAGNOSIS — I2510 Atherosclerotic heart disease of native coronary artery without angina pectoris: Secondary | ICD-10-CM | POA: Diagnosis not present

## 2018-06-09 DIAGNOSIS — E785 Hyperlipidemia, unspecified: Secondary | ICD-10-CM

## 2018-06-09 DIAGNOSIS — I10 Essential (primary) hypertension: Secondary | ICD-10-CM

## 2018-06-09 NOTE — Patient Instructions (Addendum)
No changes today  Thrilled the shoulder surgery went so well  When you get your next shingrix shot- please let us know the date so we can put it in our system

## 2018-06-09 NOTE — Assessment & Plan Note (Signed)
S: controlled on losartan 50mg  BID, metoprolol 25mg  BID, amlodipine 2.5mg .   History orthostatic hypotension- lately no issues. Home BPs looking great around 130s over 70s still BP Readings from Last 3 Encounters:  06/09/18 136/78  05/05/18 136/86  12/30/17 118/60  A/P: We discussed blood pressure goal of <140/90. No change today

## 2018-06-09 NOTE — Progress Notes (Signed)
Subjective:  Joseph Simpson is a 83 y.o. year old very pleasant male patient who presents for/with See problem oriented charting ROS- No chest Simpson (no recurrence since seeing Dr. Jerline Simpson) or shortness of breath. No headache.   Spasms in stomach have gotten better- did have some episodes last week.   Past Medical History-  Patient Active Problem List   Diagnosis Date Noted  . CAD (coronary artery disease)     Priority: High  . Macular degeneration 11/17/2011    Priority: High  . Hyperlipidemia 06/10/2016    Priority: Medium  . BPH (benign prostatic hyperplasia) 11/23/2014    Priority: Medium  . Palpitations 07/31/2014    Priority: Medium  . Dyspnea 02/27/2014    Priority: Medium  . History of melanoma- arm 09/26/2007    Priority: Medium  . Essential hypertension 09/23/2007    Priority: Medium  . PAC (premature atrial contraction) 10/25/2015    Priority: Low  . 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 Cough 05/10/2014    Priority: Low  . Pulmonary nodule 05/10/2014    Priority: Low  . PVC's (premature ventricular contractions) 09/27/2013    Priority: Low  . Abnormal stress test 10/16/2012    Priority: Low  . Murmur 02/10/2012    Priority: Low  . Nephrolithiasis 09/04/2011    Priority: Low  . GERD 09/23/2007    Priority: Low  . Obstructive sleep apnea 09/23/2007    Priority: Low  . 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 Syncope   . SVT (supraventricular tachycardia) ([place])   . History of hiatal hernia   . Left inguinal hernia 07/12/2016    Medications- reviewed and updated Current Outpatient Medications  Medication Sig Dispense Refill  . albuterol (PROAIR HFA) 108 (90 Base) MCG/ACT inhaler Inhale 2 puffs into the lungs every 6 (six) hours as needed for wheezing or shortness of breath. 1 Inhaler 3  . amLODipine (NORVASC) 2.5 MG tablet TAKE 1 TABLET BY MOUTH DAILY 90 tablet 1  . bevacizumab (AVASTIN) 2.5 mg/0.1 mL  SOLN 2.5 mg by Intravitreal route. Injection to eye every 8 weeks - use with vigamox    . losartan (COZAAR) 100 MG tablet TAKE HALF TABLET BY MOUTH TWICE DAILY  90 tablet 2  . metoCLOPramide (REGLAN) 10 MG tablet Take 5 mg by mouth 2 (two) times daily.    . metoprolol tartrate (LOPRESSOR) 25 MG tablet Take 1 tablet (25 mg total) by mouth 2 (two) times daily. 180 tablet 3  . pantoprazole (PROTONIX) 40 MG tablet Take 40 mg by mouth 2 (two) times daily.     Joseph Simpson Glycol-Propyl Glycol (SYSTANE ULTRA) 0.4-0.3 % SOLN Place 1 drop into both eyes 3 (three) times daily.     . tamsulosin (FLOMAX) 0.4 MG CAPS capsule Take 0.4 mg by mouth daily as needed (for urinary symptoms).     Marland Kitchen VIGAMOX 0.5 % ophthalmic solution Apply 1 drop to eye as directed. Take every 8 weeks as directed - use with avastin     No current facility-administered medications for this visit.     Objective: BP 136/78 (BP Location: Left Arm, Patient Position: Sitting, Cuff Size: Large)   Pulse 69   Temp 98.5 F (36.9 C) (Oral)   Ht 6\' 1"  (1.854 m)   Wt 182 lb 3.2 oz (82.6 kg)   SpO2 96%   BMI 24.04 kg/m  Gen: NAD, resting comfortably, appears younger than stated age CV:  RRR no murmurs rubs or gallops Lungs: CTAB no crackles, wheeze, rhonchi Abdomen: soft/nontender/nondistended/normal bowel sounds. No rebound or guarding.  Ext: no edema Skin: warm, dry Assessment/Plan:  Other notes: 1. shingrix #1 3 weeks ago at [employer]- will send [place] date of next one 2. Shoulder surgery with murphy/wainer- he states no Simpson. Surgery was a great decision for him. Finished therapy.  3. Right forearm very mild numbness, mild Simpson at times. Have discusse dcould be cervical origin 4. For gastroparesis able to cut reglan in half. For gerd still on protonix twice a day 5. hasnt had to use albuterol lately 6. Using tylenol up to 4x a day- for aches and pains. Discussed 3g daily limit  Essential hypertension S: controlled on losartan 50mg   BID, metoprolol 25mg  BID, amlodipine 2.5mg .   History orthostatic hypotension- lately no issues. Home BPs looking great around 130s over 70s still BP Readings from Last 3 Encounters:  06/09/18 136/78  05/05/18 136/86  12/30/17 118/60  A/P: We discussed blood pressure goal of <140/90. No change today  Hyperlipidemia S: suspect mild poorly controlled on no rx. We usesd atorvastatin 10mg  Started around time of surgery with minimally obstructive CAD. He decided after surgery - he stopped as we discussed previously Lab Results  Component Value Date   CHOL 171 06/10/2017   HDL 34.00 (L) 06/10/2017   LDLCALC 99 06/10/2017   TRIG 194.0 (H) 06/10/2017   CHOLHDL 5 06/10/2017   A/P: can update full lipids at future visit- over age 58 unlikely to start ong term statin.   Future Appointments  Date Time Provider [place]  06/16/2018  1:00 PM [facility]ACH [facility]  06/21/2018  7:30 AM Hayden Pedro, MD [facility] None   Return in about 6 months (around 12/10/2018) for follow up- or sooner if needed. come fasting to next visit. Had some labs in January- we opted to hold off until next visit Return precautions advised.  Garret Reddish, MD

## 2018-06-09 NOTE — Assessment & Plan Note (Signed)
S: suspect mild poorly controlled on no rx. We usesd atorvastatin 10mg  Started around time of surgery with minimally obstructive CAD. He decided after surgery - he stopped as we discussed previously Lab Results  Component Value Date   CHOL 171 06/10/2017   HDL 34.00 (L) 06/10/2017   LDLCALC 99 06/10/2017   TRIG 194.0 (H) 06/10/2017   CHOLHDL 5 06/10/2017   A/P: can update full lipids at future visit- over age 81 unlikely to start ong term statin.

## 2018-06-15 NOTE — Progress Notes (Signed)
Subjective:   Joseph Simpson is a 83 y.o. male who presents for Medicare Annual/Subsequent preventive examination.  Reports health as good  Total shoulder arthroplasty February 07, 2018  Wife passed away 3 yo Living alone One son and 2 grands (boy and girl)  One great grand child  Now has a 3 wheel motorcycle  Slingshot motorcycle Has seat side by side  Wife and he retired and road out [place];  Has someone mow the yard, limited on exercise due to heart and BP condition and not sure why  Can't work in the heat or clean his home  Has Nature conservation officer background., [employer]  Does not have VA benefits by choice    Shoulder surgery in 2023-02-07; doing great   Diet BMI 24  Chol/ldl 5; hdl 34; ldl 99 and trig 194 Goes out to eat or makes a sandwich    Exercise Walks some; stays out of the heat  Limited   Sleep patterns:  6-8 hrs/night. Wakes up feeling tired.  Health Maintenance Due  Topic Date Due  . INFLUENZA VACCINE  06/16/2018    psa 2011 Colonoscopy 11/2013  shingrix 05/2018    Remote smoking; does not meet criteria for screens Cardiac Risk Factors include: advanced age (>10men, >14 women);family history of premature cardiovascular disease;hypertension;male gender     Objective:    Vitals: BP 130/70   Pulse (!) 57   Ht 6\' 1"  (1.854 m)   Wt 183 lb (83 kg)   SpO2 96%   BMI 24.14 kg/m   Body mass index is 24.14 kg/m.  Advanced Directives 06/16/2018 12/30/2017 12/15/2017 06/02/2017 07/20/2016 07/03/2016 07/29/2014  Does Patient Have a Medical Advance Directive? Yes Yes Yes Yes - Yes No  Type of Advance Directive - Living will [place];Living will [place];Living will Living will Living will -  Does patient want to make changes to medical advance directive? - No - Patient declined - No - Patient declined No - Patient declined No - Patient declined -  Copy of [place] in Chart? - - No - copy requested No - copy requested - No - copy  requested -  Would patient like information on creating a medical advance directive? - - - - - - No - patient declined information  Pre-existing out of facility DNR order (yellow form or pink MOST form) - - - - - - -    Tobacco Social History   Tobacco Use  Smoking Status Former Smoker  . Packs/day: 0.10  . Years: 4.00  . Pack years: 0.40  . Types: Cigarettes  . Last attempt to quit: 09/03/1956  . Years since quitting: 61.8  Smokeless Tobacco Never Used     Counseling given: Yes   Clinical Intake:     Past Medical History:  Diagnosis Date  . Abnormal stress test December 2013   s/p cardiac cath with minimal nonobstructive CAD and normal LV function; medical management recommended  . Arthritis   . CAD (coronary artery disease)   . Dyslipidemia   . Dysrhythmia   . GERD (gastroesophageal reflux disease)   . History of hiatal hernia   . History of kidney stones   . Hypertension   . Macular degeneration   . Melanoma ([place])   . Obstructive sleep apnea 09/23/2007   Wore cpap previously. Stopped and not interested despite risks.     . OSA (obstructive sleep apnea)   . PAC (premature atrial contraction) 10/25/2015  . PVC's (  premature ventricular contractions) 09/27/2013  . SVT (supraventricular tachycardia) ([place])   . Syncope    Past Surgical History:  Procedure Laterality Date  . APPENDECTOMY    . CARDIAC CATHETERIZATION  December 2013   minimal nonobstructive CAD with normal LV function  . CATARACT EXTRACTION    . CYSTOSCOPY WITH LITHOLAPAXY N/A 07/13/2016   Procedure: CYSTOSCOPY WITH LITHOLAPAXY;  Surgeon: Franchot Gallo, MD;  Location: [facility];  Service: Urology;  Laterality: N/A;  . CYSTOSCOPY/RETROGRADE/URETEROSCOPY/STONE EXTRACTION WITH BASKET  02/24/2012   Procedure: CYSTOSCOPY/RETROGRADE/URETEROSCOPY/STONE EXTRACTION WITH BASKET;  Surgeon: Franchot Gallo, MD;  Location[facility];  Service: Urology;  Laterality: Bilateral;  1 HOUR  .  GASTRECTOMY    . HEMORRHOID SURGERY    . HERNIA REPAIR  07/13/2016  . INGUINAL HERNIA REPAIR Left 07/13/2016   Procedure: REPAIR LEFT INGUINAL HERNIA;  Surgeon: Armandina Gemma, MD;  Location: [facility];  Service: General;  Laterality: Left;  . INSERTION OF MESH Left 07/13/2016   Procedure: INSERTION OF MESH;  Surgeon: Armandina Gemma, MD;  Location: [facility];  Service: General;  Laterality: Left;  Marland Kitchen MELANOMA EXCISION  05-05-2007   LEFT FOREARM  . NOSE SURGERY    . TONSILLECTOMY    . TOTAL SHOULDER ARTHROPLASTY Right 12/29/2017  . TOTAL SHOULDER ARTHROPLASTY Right 12/29/2017   Procedure: TOTAL SHOULDER ARTHROPLASTY;  Surgeon: Hiram Gash, MD;  Location: [place];  Service: Orthopedics;  Laterality: Right;   Family History  Problem Relation Age of Onset  . Heart disease Mother        CABG in her 60s  . Pneumonia Father   . Cancer Sister        breast  . Lung cancer Brother        smoker   Social History   Socioeconomic History  . Marital status: Widowed    Spouse name: Not on file  . Number of children: 1  . Years of education: Not on file  . Highest education level: Not on file  Occupational History  . Not on file  Social Needs  . Financial resource strain: Not on file  . Food insecurity:    Worry: Not on file    Inability: Not on file  . Transportation needs:    Medical: Not on file    Non-medical: Not on file  Tobacco Use  . Smoking status: Former Smoker    Packs/day: 0.10    Years: 4.00    Pack years: 0.40    Types: Cigarettes    Last attempt to quit: 09/03/1956    Years since quitting: 61.8  . Smokeless tobacco: Never Used  Substance and Sexual Activity  . Alcohol use: No    Alcohol/week: 0.0 oz  . Drug use: No  . Sexual activity: Yes  Lifestyle  . Physical activity:    Days per week: Not on file    Minutes per session: Not on file  . Stress: Not on file  Relationships  . Social connections:    Talks on phone: Not on file    Gets together: Not on file    Attends  religious service: Not on file    Active member of club or organization: Not on file    Attends meetings of clubs or organizations: Not on file    Relationship status: Not on file  Other Topics Concern  . 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       Hobbies: ride motorcycle (slingshot 3 year), rabbit and dear hunt    Outpatient Encounter Medications as of 06/16/2018  Medication Sig  . albuterol (PROAIR HFA) 108 (90 Base) MCG/ACT inhaler Inhale 2 puffs into the lungs every 6 (six) hours as needed for wheezing or shortness of breath.  Marland Kitchen amLODipine (NORVASC) 2.5 MG tablet TAKE 1 TABLET BY MOUTH DAILY  . bevacizumab (AVASTIN) 2.5 mg/0.1 mL SOLN 2.5 mg by Intravitreal route. Injection to eye every 8 weeks - use with vigamox  . losartan (COZAAR) 100 MG tablet TAKE HALF TABLET BY MOUTH TWICE DAILY   . metoCLOPramide (REGLAN) 10 MG tablet Take 5 mg by mouth 2 (two) times daily.  . metoprolol tartrate (LOPRESSOR) 25 MG tablet Take 1 tablet (25 mg total) by mouth 2 (two) times daily.  . pantoprazole (PROTONIX) 40 MG tablet Take 40 mg by mouth 2 (two) times daily.   [name] Glycol-Propyl Glycol (SYSTANE ULTRA) 0.4-0.3 % SOLN Place 1 drop into both eyes 3 (three) times daily.   . tamsulosin (FLOMAX) 0.4 MG CAPS capsule Take 0.4 mg by mouth daily as needed (for urinary symptoms).   Marland Kitchen VIGAMOX 0.5 % ophthalmic solution Apply 1 drop to eye as directed. Take every 8 weeks as directed - use with avastin   No facility-administered encounter medications on file as of 06/16/2018.     Activities of Daily Living In your present state of health, do you have any difficulty performing the following activities: 06/16/2018 12/30/2017  Hearing? N -  Vision? N -  Difficulty concentrating or making decisions? N -  Comment good historian today  -    Walking or climbing stairs? N -  Dressing or bathing? N -  Doing errands, shopping? N N  Preparing Food and eating ? N -  Using the Toilet? N -  In the past six months, have you accidently leaked urine? N -  Do you have problems with loss of bowel control? N -  Managing your Medications? N -  Managing your Finances? N -  Housekeeping or managing your Housekeeping? N -  Some recent data might be hidden    Patient Care Team: Marin Olp, MD as PCP - General (Family Medicine) [place], Peter M, MD as Consulting Physician (Cardiology)   Assessment:   This is a routine wellness examination for Ste[place].  Exercise Activities and Dietary recommendations Current Exercise Habits: Home exercise routine, Intensity: Mild(because mild due to BP and heart)  Goals    . Patient Stated     Maintain your health !  Travel when you can        Fall Risk Fall Risk  06/16/2018 06/09/2018 06/02/2017 06/10/2016 03/04/2015  Falls in the past year? No No No No No     Depression Screen PHQ 2/9 Scores 06/16/2018 06/09/2018 06/02/2017 06/10/2016  PHQ - 2 Score 0 0 0 0    Cognitive Function MMSE - Mini Mental State Exam 06/16/2018  Not completed: (No Data)   Ad8 score reviewed for issues:  Issues making decisions:  Less interest in hobbies / activities:  Repeats questions, stories (family complaining):  Trouble using ordinary gadgets (microwave, computer, phone):  Forgets the month or year:   Mismanaging finances:   Remembering appts:  Daily problems with thinking and/or memory: Ad8 score is=0 Engaged in assessment. Mind is quick and no  issues noted with memory        Immunization History  Administered Date(s) Administered  . Influenza Split 09/04/2011, 08/19/2012  . Influenza Whole 08/31/2007, 09/16/2008, 10/07/2009, 10/01/2010  . Influenza, High Dose Seasonal PF 08/25/2013, 09/24/2015, 08/27/2016, 09/01/2017  . Influenza,inj,Quad PF,6+ Mos 09/05/2014  . Pneumococcal Conjugate-13  11/23/2014  . Pneumococcal Polysaccharide-23 10/16/2002  . Td 03/04/1998, 10/09/2008  . Zoster 04/28/2011  . Zoster Recombinat (Shingrix) 05/17/2018     Screening Tests Health Maintenance  Topic Date Due  . INFLUENZA VACCINE  06/16/2018  . TETANUS/TDAP  10/09/2018  . PNA vac Low Risk Adult  Completed         Plan:      PCP Notes   Health Maintenance  Had 1st dose of shingrix  Will make a nurse apt for the flu vaccine this fall  Abnormal Screens  None noted  Referrals  none  Patient concerns; none  Nurse Concerns; none  Next PCP apt 12/13/2018       I have personally reviewed and noted the following in the patient's chart:   . Medical and social history . Use of alcohol, tobacco or illicit drugs  . Current medications and supplements . Functional ability and status . Nutritional status . Physical activity . Advanced directives . List of other physicians . Hospitalizations, surgeries, and ER visits in previous 12 months . Vitals . Screenings to include cognitive, depression, and falls . Referrals and appointments  In addition, I have reviewed and discussed with patient certain preventive protocols, quality metrics, and best practice recommendations. A written personalized care plan for preventive services as well as general preventive health recommendations were provided to patient.     Wynetta Fines, RN  06/16/2018

## 2018-06-16 ENCOUNTER — Ambulatory Visit (INDEPENDENT_AMBULATORY_CARE_PROVIDER_SITE_OTHER): Payer: Medicare Other

## 2018-06-16 VITALS — BP 130/70 | HR 57 | Ht 73.0 in | Wt 183.0 lb

## 2018-06-16 DIAGNOSIS — Z0000 Encounter for general adult medical examination without abnormal findings: Secondary | ICD-10-CM

## 2018-06-16 NOTE — Patient Instructions (Addendum)
[name] , Thank you for taking time to come for your Medicare Wellness Visit. I appreciate your ongoing commitment to your health goals. Please review the following plan we discussed and let me know if I can assist you in the future.   Will complete your shingrix   Will call to make apt with nurse to get your flu vaccine  These are the goals we discussed: Goals    . Patient Stated     Maintain your health !  Travel when you can        This is a list of the screening recommended for you and due dates:  Health Maintenance  Topic Date Due  . Flu Shot  06/16/2018  . Tetanus Vaccine  10/09/2018  . Pneumonia vaccines  Completed      Fall Prevention in the Home Falls can cause injuries. They can happen to people of all ages. There are many things you can do to make your home safe and to help prevent falls. What can I do on the outside of my home?  Regularly fix the edges of walkways and driveways and fix any cracks.  Remove anything that might make you trip as you walk through a door, such as a raised step or threshold.  Trim any bushes or trees on the path to your home.  Use bright outdoor lighting.  Clear any walking paths of anything that might make someone trip, such as rocks or tools.  Regularly check to see if handrails are loose or broken. Make sure that both sides of any steps have handrails.  Any raised decks and porches should have guardrails on the edges.  Have any leaves, snow, or ice cleared regularly.  Use sand or salt on walking paths during winter.  Clean up any spills in your garage right away. This includes oil or grease spills. What can I do in the bathroom?  Use night lights.  Install grab bars by the toilet and in the tub and shower. Do not use towel bars as grab bars.  Use non-skid mats or decals in the tub or shower.  If you need to sit down in the shower, use a plastic, non-slip stool.  Keep the floor dry. Clean up any water that spills  on the floor as soon as it happens.  Remove soap buildup in the tub or shower regularly.  Attach bath mats securely with double-sided non-slip rug tape.  Do not have throw rugs and other things on the floor that can make you trip. What can I do in the bedroom?  Use night lights.  Make sure that you have a light by your bed that is easy to reach.  Do not use any sheets or blankets that are too big for your bed. They should not hang down onto the floor.  Have a firm chair that has side arms. You can use this for support while you get dressed.  Do not have throw rugs and other things on the floor that can make you trip. What can I do in the kitchen?  Clean up any spills right away.  Avoid walking on wet floors.  Keep items that you use a lot in easy-to-reach places.  If you need to reach something above you, use a strong step stool that has a grab bar.  Keep electrical cords out of the way.  Do not use floor polish or wax that makes floors slippery. If you must use wax, use non-skid floor wax.  Do not have throw rugs and other things on the floor that can make you trip. What can I do with my stairs?  Do not leave any items on the stairs.  Make sure that there are handrails on both sides of the stairs and use them. Fix handrails that are broken or loose. Make sure that handrails are as long as the stairways.  Check any carpeting to make sure that it is firmly attached to the stairs. Fix any carpet that is loose or worn.  Avoid having throw rugs at the top or bottom of the stairs. If you do have throw rugs, attach them to the floor with carpet tape.  Make sure that you have a light switch at the top of the stairs and the bottom of the stairs. If you do not have them, ask someone to add them for you. What else can I do to help prevent falls?  Wear shoes that: ? Do not have high heels. ? Have rubber bottoms. ? Are comfortable and fit you well. ? Are closed at the toe. Do  not wear sandals.  If you use a stepladder: ? Make sure that it is fully opened. Do not climb a closed stepladder. ? Make sure that both sides of the stepladder are locked into place. ? Ask someone to hold it for you, if possible.  Clearly mark and make sure that you can see: ? Any grab bars or handrails. ? First and last steps. ? Where the edge of each step is.  Use tools that help you move around (mobility aids) if they are needed. These include: ? Canes. ? Walkers. ? Scooters. ? Crutches.  Turn on the lights when you go into a dark area. Replace any light bulbs as soon as they burn out.  Set up your furniture so you have a clear path. Avoid moving your furniture around.  If any of your floors are uneven, fix them.  If there are any pets around you, be aware of where they are.  Review your medicines with your doctor. Some medicines can make you feel dizzy. This can increase your chance of falling. Ask your doctor what other things that you can do to help prevent falls. This information is not intended to replace advice given to you by your health care provider. Make sure you discuss any questions you have with your health care provider. Document Released: 08/29/2009 Document Revised: 04/09/2016 Document Reviewed: 12/07/2014 Elsevier Interactive Patient Education  2018 [place] Maintenance, Male A healthy lifestyle and preventive care is important for your health and wellness. Ask your health care provider about what schedule of regular examinations is right for you. What should I know about weight and diet? Eat a Healthy Diet  Eat plenty of vegetables, fruits, whole grains, low-fat dairy products, and lean protein.  Do not eat a lot of foods high in solid fats, added sugars, or salt.  Maintain a Healthy Weight Regular exercise can help you achieve or maintain a healthy weight. You should:  Do at least 150 minutes of exercise each week. The exercise should  increase your heart rate and make you sweat (moderate-intensity exercise).  Do strength-training exercises at least twice a week.  Watch Your Levels of Cholesterol and Blood Lipids  Have your blood tested for lipids and cholesterol every 5 years starting at 81 years of age. If you are at high risk for heart disease, you should start having your blood tested when you  are 81 years old. You may need to have your cholesterol levels checked more often if: ? Your lipid or cholesterol levels are high. ? You are older than 81 years of age. ? You are at high risk for heart disease.  What should I know about cancer screening? Many types of cancers can be detected early and may often be prevented. Lung Cancer  You should be screened every year for lung cancer if: ? You are a current smoker who has smoked for at least 30 years. ? You are a former smoker who has quit within the past 15 years.  Talk to your health care provider about your screening options, when you should start screening, and how often you should be screened.  Colorectal Cancer  Routine colorectal cancer screening usually begins at 81 years of age and should be repeated every 5-10 years until you are 81 years old. You may need to be screened more often if early forms of precancerous polyps or small growths are found. Your health care provider may recommend screening at an earlier age if you have risk factors for colon cancer.  Your health care provider may recommend using home test kits to check for hidden blood in the stool.  A small camera at the end of a tube can be used to examine your colon (sigmoidoscopy or colonoscopy). This checks for the earliest forms of colorectal cancer.  Prostate and Testicular Cancer  Depending on your age and overall health, your health care provider may do certain tests to screen for prostate and testicular cancer.  Talk to your health care provider about any symptoms or concerns you have about  testicular or prostate cancer.  Skin Cancer  Check your skin from head to toe regularly.  Tell your health care provider about any new moles or changes in moles, especially if: ? There is a change in a mole's size, shape, or color. ? You have a mole that is larger than a pencil eraser.  Always use sunscreen. Apply sunscreen liberally and repeat throughout the day.  Protect yourself by wearing long sleeves, pants, a wide-brimmed hat, and sunglasses when outside.  What should I know about heart disease, diabetes, and high blood pressure?  If you are 90-49 years of age, have your blood pressure checked every 3-5 years. If you are 36 years of age or older, have your blood pressure checked every year. You should have your blood pressure measured twice-once when you are at a hospital or clinic, and once when you are not at a hospital or clinic. Record the average of the two measurements. To check your blood pressure when you are not at a hospital or clinic, you can use: ? An automated blood pressure machine at a pharmacy. ? A home blood pressure monitor.  Talk to your health care provider about your target blood pressure.  If you are between 73-38 years old, ask your health care provider if you should take aspirin to prevent heart disease.  Have regular diabetes screenings by checking your fasting blood sugar level. ? If you are at a normal weight and have a low risk for diabetes, have this test once every three years after the age of 52. ? If you are overweight and have a high risk for diabetes, consider being tested at a younger age or more often.  A one-time screening for abdominal aortic aneurysm (AAA) by ultrasound is recommended for men aged 60-75 years who are current or former smokers. What should  I know about preventing infection? Hepatitis B If you have a higher risk for hepatitis B, you should be screened for this virus. Talk with your health care provider to find out if you are  at risk for hepatitis B infection. Hepatitis C Blood testing is recommended for:  Everyone born from [birth year] through 1963.  Anyone with known risk factors for hepatitis C.  Sexually Transmitted Diseases (STDs)  You should be screened each year for STDs including gonorrhea and chlamydia if: ? You are sexually active and are younger than 81 years of age. ? You are older than 81 years of age and your health care provider tells you that you are at risk for this type of infection. ? Your sexual activity has changed since you were last screened and you are at an increased risk for chlamydia or gonorrhea. Ask your health care provider if you are at risk.  Talk with your health care provider about whether you are at high risk of being infected with HIV. Your health care provider may recommend a prescription medicine to help prevent HIV infection.  What else can I do?  Schedule regular health, dental, and eye exams.  Stay current with your vaccines (immunizations).  Do not use any tobacco products, such as cigarettes, chewing tobacco, and e-cigarettes. If you need help quitting, ask your health care provider.  Limit alcohol intake to no more than 2 drinks per day. One drink equals 12 ounces of beer, 5 ounces of wine, or 1 ounces of hard liquor.  Do not use street drugs.  Do not share needles.  Ask your health care provider for help if you need support or information about quitting drugs.  Tell your health care provider if you often feel depressed.  Tell your health care provider if you have ever been abused or do not feel safe at home. This information is not intended to replace advice given to you by your health care provider. Make sure you discuss any questions you have with your health care provider. Document Released: 04/30/2008 Document Revised: 07/01/2016 Document Reviewed: 08/06/2015 Elsevier Interactive Patient Education  [employer].

## 2018-06-16 NOTE — Progress Notes (Signed)
I have personally reviewed the Medicare Annual Wellness Visit and agree with the assessment and plan.  Algis Greenhouse. Jerline Pain, MD 06/16/2018 2:15 PM

## 2018-06-21 ENCOUNTER — Encounter (INDEPENDENT_AMBULATORY_CARE_PROVIDER_SITE_OTHER): Payer: Medicare Other | Admitting: Ophthalmology

## 2018-06-21 DIAGNOSIS — I10 Essential (primary) hypertension: Secondary | ICD-10-CM

## 2018-06-21 DIAGNOSIS — H43813 Vitreous degeneration, bilateral: Secondary | ICD-10-CM | POA: Diagnosis not present

## 2018-06-21 DIAGNOSIS — H33301 Unspecified retinal break, right eye: Secondary | ICD-10-CM

## 2018-06-21 DIAGNOSIS — D3132 Benign neoplasm of left choroid: Secondary | ICD-10-CM | POA: Diagnosis not present

## 2018-06-21 DIAGNOSIS — H35033 Hypertensive retinopathy, bilateral: Secondary | ICD-10-CM | POA: Diagnosis not present

## 2018-06-21 DIAGNOSIS — H353231 Exudative age-related macular degeneration, bilateral, with active choroidal neovascularization: Secondary | ICD-10-CM

## 2018-06-29 DIAGNOSIS — M25552 Pain in left hip: Secondary | ICD-10-CM | POA: Diagnosis not present

## 2018-07-05 DIAGNOSIS — H179 Unspecified corneal scar and opacity: Secondary | ICD-10-CM | POA: Diagnosis not present

## 2018-07-05 DIAGNOSIS — L718 Other rosacea: Secondary | ICD-10-CM | POA: Diagnosis not present

## 2018-07-05 DIAGNOSIS — D23111 Other benign neoplasm of skin of right upper eyelid, including canthus: Secondary | ICD-10-CM | POA: Diagnosis not present

## 2018-07-05 DIAGNOSIS — H00015 Hordeolum externum left lower eyelid: Secondary | ICD-10-CM | POA: Diagnosis not present

## 2018-07-13 DIAGNOSIS — M25552 Pain in left hip: Secondary | ICD-10-CM | POA: Diagnosis not present

## 2018-07-19 ENCOUNTER — Telehealth: Payer: Self-pay | Admitting: *Deleted

## 2018-07-19 DIAGNOSIS — L718 Other rosacea: Secondary | ICD-10-CM | POA: Diagnosis not present

## 2018-07-19 DIAGNOSIS — H00015 Hordeolum externum left lower eyelid: Secondary | ICD-10-CM | POA: Diagnosis not present

## 2018-07-19 NOTE — Telephone Encounter (Signed)
Copied from [place] [id number]. Topic: General - Other >> Jul 19, 2018  3:27 PM Carolyn Stare wrote:  Pt said said Dr Yong Channel told him to let him know when he receives his last shingrix ,he call to say he received that inject today at [facility]

## 2018-07-19 NOTE — Telephone Encounter (Signed)
Chart updated with Shingrix vaccine done today per pt.

## 2018-07-27 DIAGNOSIS — H00015 Hordeolum externum left lower eyelid: Secondary | ICD-10-CM | POA: Diagnosis not present

## 2018-07-27 DIAGNOSIS — L718 Other rosacea: Secondary | ICD-10-CM | POA: Diagnosis not present

## 2018-07-27 DIAGNOSIS — H16122 Filamentary keratitis, left eye: Secondary | ICD-10-CM | POA: Diagnosis not present

## 2018-07-27 DIAGNOSIS — H04123 Dry eye syndrome of bilateral lacrimal glands: Secondary | ICD-10-CM | POA: Diagnosis not present

## 2018-07-29 DIAGNOSIS — H04123 Dry eye syndrome of bilateral lacrimal glands: Secondary | ICD-10-CM | POA: Diagnosis not present

## 2018-07-29 DIAGNOSIS — H00015 Hordeolum externum left lower eyelid: Secondary | ICD-10-CM | POA: Diagnosis not present

## 2018-07-29 DIAGNOSIS — H16122 Filamentary keratitis, left eye: Secondary | ICD-10-CM | POA: Diagnosis not present

## 2018-07-29 DIAGNOSIS — L718 Other rosacea: Secondary | ICD-10-CM | POA: Diagnosis not present

## 2018-08-02 ENCOUNTER — Encounter (INDEPENDENT_AMBULATORY_CARE_PROVIDER_SITE_OTHER): Payer: Medicare Other | Admitting: Ophthalmology

## 2018-08-02 DIAGNOSIS — H353231 Exudative age-related macular degeneration, bilateral, with active choroidal neovascularization: Secondary | ICD-10-CM

## 2018-08-02 DIAGNOSIS — H35033 Hypertensive retinopathy, bilateral: Secondary | ICD-10-CM

## 2018-08-02 DIAGNOSIS — H43813 Vitreous degeneration, bilateral: Secondary | ICD-10-CM | POA: Diagnosis not present

## 2018-08-02 DIAGNOSIS — I10 Essential (primary) hypertension: Secondary | ICD-10-CM | POA: Diagnosis not present

## 2018-08-02 DIAGNOSIS — D3132 Benign neoplasm of left choroid: Secondary | ICD-10-CM

## 2018-08-11 DIAGNOSIS — L718 Other rosacea: Secondary | ICD-10-CM | POA: Diagnosis not present

## 2018-08-11 DIAGNOSIS — H04123 Dry eye syndrome of bilateral lacrimal glands: Secondary | ICD-10-CM | POA: Diagnosis not present

## 2018-08-11 DIAGNOSIS — H16122 Filamentary keratitis, left eye: Secondary | ICD-10-CM | POA: Diagnosis not present

## 2018-08-11 DIAGNOSIS — H179 Unspecified corneal scar and opacity: Secondary | ICD-10-CM | POA: Diagnosis not present

## 2018-08-23 ENCOUNTER — Ambulatory Visit (INDEPENDENT_AMBULATORY_CARE_PROVIDER_SITE_OTHER): Payer: Medicare Other

## 2018-08-23 DIAGNOSIS — Z23 Encounter for immunization: Secondary | ICD-10-CM | POA: Diagnosis not present

## 2018-08-23 NOTE — Progress Notes (Signed)
Patient in today for Flu Vaccine. VIS given. Tolerated well. Administered in left arm.

## 2018-09-13 ENCOUNTER — Encounter (INDEPENDENT_AMBULATORY_CARE_PROVIDER_SITE_OTHER): Payer: Medicare Other | Admitting: Ophthalmology

## 2018-09-13 DIAGNOSIS — H35033 Hypertensive retinopathy, bilateral: Secondary | ICD-10-CM | POA: Diagnosis not present

## 2018-09-13 DIAGNOSIS — I10 Essential (primary) hypertension: Secondary | ICD-10-CM

## 2018-09-13 DIAGNOSIS — H43813 Vitreous degeneration, bilateral: Secondary | ICD-10-CM

## 2018-09-13 DIAGNOSIS — H33301 Unspecified retinal break, right eye: Secondary | ICD-10-CM

## 2018-09-13 DIAGNOSIS — H353231 Exudative age-related macular degeneration, bilateral, with active choroidal neovascularization: Secondary | ICD-10-CM | POA: Diagnosis not present

## 2018-09-13 DIAGNOSIS — D3132 Benign neoplasm of left choroid: Secondary | ICD-10-CM | POA: Diagnosis not present

## 2018-10-18 ENCOUNTER — Encounter (INDEPENDENT_AMBULATORY_CARE_PROVIDER_SITE_OTHER): Payer: Medicare Other | Admitting: Ophthalmology

## 2018-10-18 DIAGNOSIS — H353231 Exudative age-related macular degeneration, bilateral, with active choroidal neovascularization: Secondary | ICD-10-CM | POA: Diagnosis not present

## 2018-10-18 DIAGNOSIS — H43813 Vitreous degeneration, bilateral: Secondary | ICD-10-CM | POA: Diagnosis not present

## 2018-10-18 DIAGNOSIS — D3132 Benign neoplasm of left choroid: Secondary | ICD-10-CM | POA: Diagnosis not present

## 2018-10-18 DIAGNOSIS — H33301 Unspecified retinal break, right eye: Secondary | ICD-10-CM | POA: Diagnosis not present

## 2018-10-18 DIAGNOSIS — H35033 Hypertensive retinopathy, bilateral: Secondary | ICD-10-CM

## 2018-10-18 DIAGNOSIS — I10 Essential (primary) hypertension: Secondary | ICD-10-CM

## 2018-11-07 DIAGNOSIS — N401 Enlarged prostate with lower urinary tract symptoms: Secondary | ICD-10-CM | POA: Diagnosis not present

## 2018-11-07 DIAGNOSIS — R350 Frequency of micturition: Secondary | ICD-10-CM | POA: Diagnosis not present

## 2018-11-07 DIAGNOSIS — N210 Calculus in bladder: Secondary | ICD-10-CM | POA: Diagnosis not present

## 2018-11-17 ENCOUNTER — Encounter (INDEPENDENT_AMBULATORY_CARE_PROVIDER_SITE_OTHER): Payer: Medicare Other | Admitting: Ophthalmology

## 2018-11-21 ENCOUNTER — Encounter (INDEPENDENT_AMBULATORY_CARE_PROVIDER_SITE_OTHER): Payer: Medicare Other | Admitting: Ophthalmology

## 2018-11-21 DIAGNOSIS — I10 Essential (primary) hypertension: Secondary | ICD-10-CM

## 2018-11-21 DIAGNOSIS — H43813 Vitreous degeneration, bilateral: Secondary | ICD-10-CM

## 2018-11-21 DIAGNOSIS — D3132 Benign neoplasm of left choroid: Secondary | ICD-10-CM | POA: Diagnosis not present

## 2018-11-21 DIAGNOSIS — H35033 Hypertensive retinopathy, bilateral: Secondary | ICD-10-CM

## 2018-11-21 DIAGNOSIS — H353231 Exudative age-related macular degeneration, bilateral, with active choroidal neovascularization: Secondary | ICD-10-CM

## 2018-11-21 DIAGNOSIS — H33301 Unspecified retinal break, right eye: Secondary | ICD-10-CM | POA: Diagnosis not present

## 2018-11-28 ENCOUNTER — Other Ambulatory Visit: Payer: Self-pay | Admitting: Family Medicine

## 2018-11-28 DIAGNOSIS — H04123 Dry eye syndrome of bilateral lacrimal glands: Secondary | ICD-10-CM | POA: Diagnosis not present

## 2018-11-28 DIAGNOSIS — L718 Other rosacea: Secondary | ICD-10-CM | POA: Diagnosis not present

## 2018-11-28 DIAGNOSIS — H179 Unspecified corneal scar and opacity: Secondary | ICD-10-CM | POA: Diagnosis not present

## 2018-11-28 DIAGNOSIS — H16122 Filamentary keratitis, left eye: Secondary | ICD-10-CM | POA: Diagnosis not present

## 2018-12-13 ENCOUNTER — Encounter: Payer: Self-pay | Admitting: Family Medicine

## 2018-12-13 ENCOUNTER — Ambulatory Visit (INDEPENDENT_AMBULATORY_CARE_PROVIDER_SITE_OTHER): Payer: Medicare Other | Admitting: Family Medicine

## 2018-12-13 VITALS — BP 110/60 | HR 58 | Temp 98.1°F | Ht 73.0 in | Wt 184.0 lb

## 2018-12-13 DIAGNOSIS — I2510 Atherosclerotic heart disease of native coronary artery without angina pectoris: Secondary | ICD-10-CM | POA: Diagnosis not present

## 2018-12-13 DIAGNOSIS — N401 Enlarged prostate with lower urinary tract symptoms: Secondary | ICD-10-CM | POA: Diagnosis not present

## 2018-12-13 DIAGNOSIS — I10 Essential (primary) hypertension: Secondary | ICD-10-CM

## 2018-12-13 DIAGNOSIS — R351 Nocturia: Secondary | ICD-10-CM | POA: Diagnosis not present

## 2018-12-13 DIAGNOSIS — K219 Gastro-esophageal reflux disease without esophagitis: Secondary | ICD-10-CM | POA: Diagnosis not present

## 2018-12-13 DIAGNOSIS — R739 Hyperglycemia, unspecified: Secondary | ICD-10-CM

## 2018-12-13 DIAGNOSIS — Z79899 Other long term (current) drug therapy: Secondary | ICD-10-CM

## 2018-12-13 DIAGNOSIS — E785 Hyperlipidemia, unspecified: Secondary | ICD-10-CM

## 2018-12-13 LAB — COMPREHENSIVE METABOLIC PANEL
ALT: 17 U/L (ref 0–53)
AST: 19 U/L (ref 0–37)
Albumin: 4.6 g/dL (ref 3.5–5.2)
Alkaline Phosphatase: 66 U/L (ref 39–117)
BUN: 10 mg/dL (ref 6–23)
CO2: 33 mEq/L — ABNORMAL HIGH (ref 19–32)
Calcium: 9.9 mg/dL (ref 8.4–10.5)
Chloride: 101 mEq/L (ref 96–112)
Creatinine, Ser: 0.82 mg/dL (ref 0.40–1.50)
GFR: 89.93 mL/min (ref >60.00)
Glucose, Bld: 107 mg/dL — ABNORMAL HIGH (ref 70–99)
Potassium: 4.4 mEq/L (ref 3.5–5.1)
Sodium: 140 mEq/L (ref 135–145)
Total Bilirubin: 1.1 mg/dL (ref 0.2–1.2)
Total Protein: 7.1 g/dL (ref 6.0–8.3)

## 2018-12-13 LAB — LIPID PANEL
Cholesterol: 179 mg/dL (ref 0–200)
HDL: 32.1 mg/dL — ABNORMAL LOW (ref >39.00)
LDL Cholesterol: 117 mg/dL — ABNORMAL HIGH (ref 0–99)
NonHDL: 146.71
Total CHOL/HDL Ratio: 6
Triglycerides: 147 mg/dL (ref 0.0–149.0)
VLDL: 29.4 mg/dL (ref 0.0–40.0)

## 2018-12-13 LAB — CBC
HCT: 46.7 % (ref 39.0–52.0)
Hemoglobin: 15.8 g/dL (ref 13.0–17.0)
MCHC: 33.8 g/dL (ref 30.0–36.0)
MCV: 97.3 fl (ref 78.0–100.0)
Platelets: 156 10*3/uL (ref 150.0–400.0)
RBC: 4.8 Mil/uL (ref 4.22–5.81)
RDW: 13.1 % (ref 11.5–15.5)
WBC: 6 10*3/uL (ref 4.0–10.5)

## 2018-12-13 LAB — VITAMIN B12: Vitamin B-12: 167 pg/mL — ABNORMAL LOW (ref 211–911)

## 2018-12-13 NOTE — Progress Notes (Signed)
Phone [phone]   Subjective:  Joseph Simpson is a 84 y.o. year old very pleasant male patient who presents for/with See problem oriented charting ROS- some lift hip pain. No chest pain or shortness of breath. No headache or blurry vision.   Past Medical History-  Patient Active Problem List   Diagnosis Date Noted  . CAD (coronary artery disease)     Priority: High  . Macular degeneration 11/17/2011    Priority: High  . Hyperlipidemia 06/10/2016    Priority: Medium  . BPH (benign prostatic hyperplasia) 11/23/2014    Priority: Medium  . Palpitations 07/31/2014    Priority: Medium  . Dyspnea 02/27/2014    Priority: Medium  . History of melanoma- arm 09/26/2007    Priority: Medium  . Essential hypertension 09/23/2007    Priority: Medium  . PAC (premature atrial contraction) 10/25/2015    Priority: Low  . 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 Cough 05/10/2014    Priority: Low  . Pulmonary nodule 05/10/2014    Priority: Low  . PVC's (premature ventricular contractions) 09/27/2013    Priority: Low  . Abnormal stress test 10/16/2012    Priority: Low  . Murmur 02/10/2012    Priority: Low  . Nephrolithiasis 09/04/2011    Priority: Low  . GERD 09/23/2007    Priority: Low  . Obstructive sleep apnea 09/23/2007    Priority: Low  . 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 Syncope   . SVT (supraventricular tachycardia) ([place])   . History of hiatal hernia   . Left inguinal hernia 07/12/2016    Medications- reviewed and updated Current Outpatient Medications  Medication Sig Dispense Refill  . albuterol (PROAIR HFA) 108 (90 Base) MCG/ACT inhaler Inhale 2 puffs into the lungs every 6 (six) hours as needed for wheezing or shortness of breath. 1 Inhaler 3  . amLODipine (NORVASC) 2.5 MG tablet TAKE ONE TABLET BY MOUTH ONE TIME DAILY  90 tablet 1  . bevacizumab (AVASTIN) 2.5 mg/0.1 mL SOLN 2.5 mg by Intravitreal route.  Injection to eye every 8 weeks - use with vigamox    . losartan (COZAAR) 100 MG tablet TAKE HALF TABLET BY MOUTH TWICE DAILY  90 tablet 2  . metoCLOPramide (REGLAN) 10 MG tablet Take 5 mg by mouth 2 (two) times daily.    . metoprolol tartrate (LOPRESSOR) 25 MG tablet Take 1 tablet (25 mg total) by mouth 2 (two) times daily. 180 tablet 3  . pantoprazole (PROTONIX) 40 MG tablet Take 40 mg by mouth 2 (two) times daily.     [name] Glycol-Propyl Glycol (SYSTANE ULTRA) 0.4-0.3 % SOLN Place 1 drop into both eyes 3 (three) times daily.     . tamsulosin (FLOMAX) 0.4 MG CAPS capsule Take 0.4 mg by mouth daily as needed (for urinary symptoms).     Marland Kitchen VIGAMOX 0.5 % ophthalmic solution Apply 1 drop to eye as directed. Take every 8 weeks as directed - use with avastin     No current facility-administered medications for this visit.      Objective:  BP 110/60 (BP Location: Right Arm, Patient Position: Sitting, Cuff Size: Normal)   Pulse (!) 58   Temp 98.1 F (36.7 C) (Oral)   Ht 6\' 1"  (1.854 m)   Wt 184 lb (83.5 kg)   SpO2 98%   BMI 24.28 kg/m  Gen: NAD, resting comfortably CV: RRR no murmurs rubs or gallops Lungs: CTAB no crackles,  wheeze, rhonchi Ext: no edema Skin: warm, dry, callus on 4th toe left foot Neuro: grossly normal, moves all extremities    Assessment and Plan    #Hypertension  S: Controlled on losartan 50 mg twice daily, metoprolol 25 mg twice daily (also helps palpitations), amlodipine 2.5 mg.  History of orthostatic hypotension- has done well lately- only trouble if he stays out in the heat for about 30 minutes. Monitors his home blood pressures- 130/80s  A/P: Stable. Continue current medications.      #Hyperlipidemia/CAD S: Patient has had minimally obstructive CAD on cath 2013.  Cannot use aspirin with prior one third removal of stomach-follows with Dr. Martinique.  We used atorvastatin 10 mg around the time of his shoulder surgery but he has declined otherwise.  LDL goal at  least under 100.  asymptomatic A/P: He prefers not to be on regular cholesterol medicine if he doesn't have to be - we jointly agreed to an LDL of 100 goal and consider once a week statin if not there.     %GERD S: Patient is compliant with high-dose PPI-40 mg twice a day per Dr. Erlene Quan of GI.  Also on Reglan. A/P: Stable. Continue current medications.  Will check B12 level with labs    %BPH S: Patient remains on tamsulosin 0.4 mg daily as needed through urology A/P: Stable. Continue current medications.     # callus on 4th toe on left foot at PIP joint- debrided with scalpel. Discussed using donut  Future Appointments  Date Time Provider [place]  12/20/2018  7:30 AM Hayden Pedro, MD [facility] None  06/22/2019  1:00 PM [facility] HEALTH COACH [facility]   Return in about 6 months (around 06/13/2019).  Lab/Order associations: FASTING Essential hypertension - Plan: CBC, Lipid panel, Comprehensive metabolic panel  Hyperlipidemia, unspecified hyperlipidemia type - Plan: CBC, Lipid panel, Comprehensive metabolic panel  Coronary artery disease involving native coronary artery of native heart without angina pectoris  Gastroesophageal reflux disease without esophagitis  Benign prostatic hyperplasia with nocturia  High risk medication use - Plan: Vitamin B12  Return precautions advised.  Garret Reddish, MD

## 2018-12-13 NOTE — Patient Instructions (Addendum)
Health Maintenance Due  Topic Date Due  . TETANUS/TDAP-consider getting tetanus shot at your pharmacy-we can do a tetanus shot here if you get a cut or scrape but insurance will not pay for it otherwise here 10/09/2018   If you have redness around the toe please let us know or if you have worsening pain or fever. Try a small donut to prevent callus from reforming/reduce pressure on toe   Please stop by lab before you go If you do not have mychart- we will call you about results within 5 business days of [place] receiving them.  If you have mychart- we will send your results within 3 business days of [place] receiving them.  If abnormal or we want to clarify a result, we will call or mychart you to make sure you receive the message.  If you have questions or concerns or don't hear within 5-7 days, please send [place] a message or call us.

## 2018-12-14 ENCOUNTER — Telehealth: Payer: Self-pay | Admitting: Family Medicine

## 2018-12-14 MED ORDER — ATORVASTATIN CALCIUM 40 MG PO TABS: ORAL_TABLET | ORAL | 3 refills | Status: DC

## 2018-12-14 NOTE — Telephone Encounter (Signed)
Spoke with pt, he is agreeable to starting atorvastatin 40 mg once weekly. Rx sent to [facility].

## 2018-12-14 NOTE — Telephone Encounter (Signed)
Per lab -  Your cholesterol is mildly elevated-I would recommend taking atorvastatin 40 mg once a week #13 with 3 refills-if he agrees, you may send this in.

## 2018-12-14 NOTE — Telephone Encounter (Signed)
See note  Copied from [place]. Topic: General - Other >> Dec 14, 2018  2:22 PM Leward Quan A wrote: Reason for CRM: Patient called to say that he spoke with Dr Ronney Lion nurse and could not remember if she was sending a Rx for cholesterol medication to the pharmacy for him. Please advise Ph# [phone]

## 2018-12-14 NOTE — Addendum Note (Signed)
Addended by: Gwenyth Ober R on: 12/14/2018 11:33 AM   Modules accepted: Orders

## 2018-12-14 NOTE — Addendum Note (Signed)
Addended by: Jasper Loser on: 12/14/2018 03:16 PM   Modules accepted: Orders

## 2018-12-20 ENCOUNTER — Encounter (INDEPENDENT_AMBULATORY_CARE_PROVIDER_SITE_OTHER): Payer: Medicare Other | Admitting: Ophthalmology

## 2018-12-20 DIAGNOSIS — H353231 Exudative age-related macular degeneration, bilateral, with active choroidal neovascularization: Secondary | ICD-10-CM | POA: Diagnosis not present

## 2018-12-20 DIAGNOSIS — H33301 Unspecified retinal break, right eye: Secondary | ICD-10-CM

## 2018-12-20 DIAGNOSIS — D3132 Benign neoplasm of left choroid: Secondary | ICD-10-CM

## 2018-12-20 DIAGNOSIS — H35033 Hypertensive retinopathy, bilateral: Secondary | ICD-10-CM | POA: Diagnosis not present

## 2018-12-20 DIAGNOSIS — H43813 Vitreous degeneration, bilateral: Secondary | ICD-10-CM

## 2018-12-20 DIAGNOSIS — I10 Essential (primary) hypertension: Secondary | ICD-10-CM | POA: Diagnosis not present

## 2018-12-22 DIAGNOSIS — M7062 Trochanteric bursitis, left hip: Secondary | ICD-10-CM | POA: Diagnosis not present

## 2018-12-22 DIAGNOSIS — M545 Low back pain: Secondary | ICD-10-CM | POA: Diagnosis not present

## 2018-12-22 DIAGNOSIS — M25511 Pain in right shoulder: Secondary | ICD-10-CM | POA: Diagnosis not present

## 2018-12-23 ENCOUNTER — Ambulatory Visit (INDEPENDENT_AMBULATORY_CARE_PROVIDER_SITE_OTHER): Payer: Medicare Other

## 2018-12-23 DIAGNOSIS — E538 Deficiency of other specified B group vitamins: Secondary | ICD-10-CM | POA: Diagnosis not present

## 2018-12-23 MED ORDER — CYANOCOBALAMIN 1000 MCG/ML IJ SOLN
1000.0000 ug | Freq: Once | INTRAMUSCULAR | Status: AC
  Administered 2018-12-23: 1000 ug via INTRAMUSCULAR

## 2018-12-23 NOTE — Progress Notes (Signed)
Per orders of Dr.Wolfe injection of Vitamin B12 given by Loralyn Freshwater. Given in left deltoid.Patient tolerated injection well.

## 2018-12-28 DIAGNOSIS — M6281 Muscle weakness (generalized): Secondary | ICD-10-CM | POA: Diagnosis not present

## 2018-12-28 DIAGNOSIS — R262 Difficulty in walking, not elsewhere classified: Secondary | ICD-10-CM | POA: Diagnosis not present

## 2018-12-28 DIAGNOSIS — M25552 Pain in left hip: Secondary | ICD-10-CM | POA: Diagnosis not present

## 2018-12-28 DIAGNOSIS — M25652 Stiffness of left hip, not elsewhere classified: Secondary | ICD-10-CM | POA: Diagnosis not present

## 2018-12-29 DIAGNOSIS — L821 Other seborrheic keratosis: Secondary | ICD-10-CM | POA: Diagnosis not present

## 2018-12-29 DIAGNOSIS — D2230 Melanocytic nevi of unspecified part of face: Secondary | ICD-10-CM | POA: Diagnosis not present

## 2018-12-29 DIAGNOSIS — Z23 Encounter for immunization: Secondary | ICD-10-CM | POA: Diagnosis not present

## 2018-12-29 DIAGNOSIS — L570 Actinic keratosis: Secondary | ICD-10-CM | POA: Diagnosis not present

## 2018-12-29 DIAGNOSIS — D2271 Melanocytic nevi of right lower limb, including hip: Secondary | ICD-10-CM | POA: Diagnosis not present

## 2018-12-29 DIAGNOSIS — D485 Neoplasm of uncertain behavior of skin: Secondary | ICD-10-CM | POA: Diagnosis not present

## 2018-12-29 DIAGNOSIS — Z87898 Personal history of other specified conditions: Secondary | ICD-10-CM | POA: Diagnosis not present

## 2018-12-29 DIAGNOSIS — Z85828 Personal history of other malignant neoplasm of skin: Secondary | ICD-10-CM | POA: Diagnosis not present

## 2018-12-29 DIAGNOSIS — L814 Other melanin hyperpigmentation: Secondary | ICD-10-CM | POA: Diagnosis not present

## 2018-12-30 ENCOUNTER — Ambulatory Visit (INDEPENDENT_AMBULATORY_CARE_PROVIDER_SITE_OTHER): Payer: Medicare Other

## 2018-12-30 DIAGNOSIS — E538 Deficiency of other specified B group vitamins: Secondary | ICD-10-CM | POA: Diagnosis not present

## 2018-12-30 MED ORDER — CYANOCOBALAMIN 1000 MCG/ML IJ SOLN
1000.0000 ug | Freq: Once | INTRAMUSCULAR | Status: AC
  Administered 2018-12-30: 1000 ug via INTRAMUSCULAR

## 2018-12-30 NOTE — Progress Notes (Signed)
Per orders of Dr. Yong Channel, injection of vitamin B12 1000 mcg given in right deltoid by Gertie Exon, CMA.  Patient tolerated injection well.  Patient will return in one week for next B12 injection.

## 2019-01-03 DIAGNOSIS — R262 Difficulty in walking, not elsewhere classified: Secondary | ICD-10-CM | POA: Diagnosis not present

## 2019-01-03 DIAGNOSIS — M7062 Trochanteric bursitis, left hip: Secondary | ICD-10-CM | POA: Diagnosis not present

## 2019-01-03 DIAGNOSIS — M6281 Muscle weakness (generalized): Secondary | ICD-10-CM | POA: Diagnosis not present

## 2019-01-03 DIAGNOSIS — M25552 Pain in left hip: Secondary | ICD-10-CM | POA: Diagnosis not present

## 2019-01-05 ENCOUNTER — Other Ambulatory Visit: Payer: Self-pay | Admitting: Family Medicine

## 2019-01-05 DIAGNOSIS — M25652 Stiffness of left hip, not elsewhere classified: Secondary | ICD-10-CM | POA: Diagnosis not present

## 2019-01-05 DIAGNOSIS — M6281 Muscle weakness (generalized): Secondary | ICD-10-CM | POA: Diagnosis not present

## 2019-01-05 DIAGNOSIS — M25552 Pain in left hip: Secondary | ICD-10-CM | POA: Diagnosis not present

## 2019-01-05 DIAGNOSIS — R262 Difficulty in walking, not elsewhere classified: Secondary | ICD-10-CM | POA: Diagnosis not present

## 2019-01-06 ENCOUNTER — Ambulatory Visit (INDEPENDENT_AMBULATORY_CARE_PROVIDER_SITE_OTHER): Payer: Medicare Other

## 2019-01-06 ENCOUNTER — Ambulatory Visit: Payer: Medicare Other

## 2019-01-06 DIAGNOSIS — E538 Deficiency of other specified B group vitamins: Secondary | ICD-10-CM

## 2019-01-06 MED ORDER — CYANOCOBALAMIN 1000 MCG/ML IJ SOLN
1000.0000 ug | Freq: Once | INTRAMUSCULAR | Status: AC
  Administered 2019-01-06: 1000 ug via INTRAMUSCULAR

## 2019-01-06 NOTE — Progress Notes (Signed)
Per orders of Dr. Yong Channel, injection of B-12 given by Francella Solian in right deltoid. Patient tolerated injection well. He has appointment for next injection already.

## 2019-01-10 DIAGNOSIS — M25552 Pain in left hip: Secondary | ICD-10-CM | POA: Diagnosis not present

## 2019-01-10 DIAGNOSIS — M6281 Muscle weakness (generalized): Secondary | ICD-10-CM | POA: Diagnosis not present

## 2019-01-10 DIAGNOSIS — M25652 Stiffness of left hip, not elsewhere classified: Secondary | ICD-10-CM | POA: Diagnosis not present

## 2019-01-10 DIAGNOSIS — R262 Difficulty in walking, not elsewhere classified: Secondary | ICD-10-CM | POA: Diagnosis not present

## 2019-01-12 DIAGNOSIS — M25652 Stiffness of left hip, not elsewhere classified: Secondary | ICD-10-CM | POA: Diagnosis not present

## 2019-01-12 DIAGNOSIS — R262 Difficulty in walking, not elsewhere classified: Secondary | ICD-10-CM | POA: Diagnosis not present

## 2019-01-12 DIAGNOSIS — M6281 Muscle weakness (generalized): Secondary | ICD-10-CM | POA: Diagnosis not present

## 2019-01-12 DIAGNOSIS — M25552 Pain in left hip: Secondary | ICD-10-CM | POA: Diagnosis not present

## 2019-01-12 NOTE — Progress Notes (Deleted)
Per orders of Dr. Yong Channel , injection of B-12 given by Francella Solian in left deltoid . Patient tolerated injection well. Has next appointment in one week.

## 2019-01-13 ENCOUNTER — Ambulatory Visit (INDEPENDENT_AMBULATORY_CARE_PROVIDER_SITE_OTHER): Payer: Medicare Other

## 2019-01-13 DIAGNOSIS — E538 Deficiency of other specified B group vitamins: Secondary | ICD-10-CM | POA: Diagnosis not present

## 2019-01-13 MED ORDER — CYANOCOBALAMIN 1000 MCG/ML IJ SOLN
1000.0000 ug | Freq: Once | INTRAMUSCULAR | Status: AC
  Administered 2019-01-13: 1000 ug via INTRAMUSCULAR

## 2019-01-13 NOTE — Patient Instructions (Signed)
Health Maintenance Due  Topic Date Due  . TETANUS/TDAP  10/09/2018    Depression screen [facility] 2/9 06/16/2018 06/09/2018 06/02/2017  Decreased Interest 0 0 0  Down, Depressed, Hopeless 0 0 0  PHQ - 2 Score 0 0 0

## 2019-01-13 NOTE — Progress Notes (Signed)
Per orders of [place], injection of B12 given by Franco Collet. Patient tolerated injection well.

## 2019-01-16 ENCOUNTER — Encounter (INDEPENDENT_AMBULATORY_CARE_PROVIDER_SITE_OTHER): Payer: Medicare Other | Admitting: Ophthalmology

## 2019-01-16 DIAGNOSIS — I10 Essential (primary) hypertension: Secondary | ICD-10-CM | POA: Diagnosis not present

## 2019-01-16 DIAGNOSIS — H353231 Exudative age-related macular degeneration, bilateral, with active choroidal neovascularization: Secondary | ICD-10-CM

## 2019-01-16 DIAGNOSIS — H35033 Hypertensive retinopathy, bilateral: Secondary | ICD-10-CM

## 2019-01-16 DIAGNOSIS — D3132 Benign neoplasm of left choroid: Secondary | ICD-10-CM

## 2019-01-16 DIAGNOSIS — H43813 Vitreous degeneration, bilateral: Secondary | ICD-10-CM | POA: Diagnosis not present

## 2019-01-17 ENCOUNTER — Other Ambulatory Visit: Payer: Self-pay | Admitting: Student

## 2019-01-17 DIAGNOSIS — M25552 Pain in left hip: Secondary | ICD-10-CM | POA: Diagnosis not present

## 2019-01-17 DIAGNOSIS — M25652 Stiffness of left hip, not elsewhere classified: Secondary | ICD-10-CM | POA: Diagnosis not present

## 2019-01-17 DIAGNOSIS — M6281 Muscle weakness (generalized): Secondary | ICD-10-CM | POA: Diagnosis not present

## 2019-01-17 DIAGNOSIS — R262 Difficulty in walking, not elsewhere classified: Secondary | ICD-10-CM | POA: Diagnosis not present

## 2019-01-19 DIAGNOSIS — M25652 Stiffness of left hip, not elsewhere classified: Secondary | ICD-10-CM | POA: Diagnosis not present

## 2019-01-19 DIAGNOSIS — R262 Difficulty in walking, not elsewhere classified: Secondary | ICD-10-CM | POA: Diagnosis not present

## 2019-01-19 DIAGNOSIS — M25552 Pain in left hip: Secondary | ICD-10-CM | POA: Diagnosis not present

## 2019-01-19 DIAGNOSIS — M6281 Muscle weakness (generalized): Secondary | ICD-10-CM | POA: Diagnosis not present

## 2019-01-24 DIAGNOSIS — M25552 Pain in left hip: Secondary | ICD-10-CM | POA: Diagnosis not present

## 2019-01-24 DIAGNOSIS — R262 Difficulty in walking, not elsewhere classified: Secondary | ICD-10-CM | POA: Diagnosis not present

## 2019-01-24 DIAGNOSIS — M6281 Muscle weakness (generalized): Secondary | ICD-10-CM | POA: Diagnosis not present

## 2019-01-24 DIAGNOSIS — M25652 Stiffness of left hip, not elsewhere classified: Secondary | ICD-10-CM | POA: Diagnosis not present

## 2019-01-26 DIAGNOSIS — M25652 Stiffness of left hip, not elsewhere classified: Secondary | ICD-10-CM | POA: Diagnosis not present

## 2019-01-26 DIAGNOSIS — R262 Difficulty in walking, not elsewhere classified: Secondary | ICD-10-CM | POA: Diagnosis not present

## 2019-01-26 DIAGNOSIS — M25552 Pain in left hip: Secondary | ICD-10-CM | POA: Diagnosis not present

## 2019-01-26 DIAGNOSIS — M6281 Muscle weakness (generalized): Secondary | ICD-10-CM | POA: Diagnosis not present

## 2019-01-30 DIAGNOSIS — M1612 Unilateral primary osteoarthritis, left hip: Secondary | ICD-10-CM | POA: Diagnosis not present

## 2019-02-09 ENCOUNTER — Ambulatory Visit: Payer: Medicare Other

## 2019-02-13 ENCOUNTER — Encounter (INDEPENDENT_AMBULATORY_CARE_PROVIDER_SITE_OTHER): Payer: Medicare Other | Admitting: Ophthalmology

## 2019-02-13 ENCOUNTER — Other Ambulatory Visit: Payer: Self-pay

## 2019-02-13 DIAGNOSIS — I10 Essential (primary) hypertension: Secondary | ICD-10-CM | POA: Diagnosis not present

## 2019-02-13 DIAGNOSIS — H35033 Hypertensive retinopathy, bilateral: Secondary | ICD-10-CM

## 2019-02-13 DIAGNOSIS — D3132 Benign neoplasm of left choroid: Secondary | ICD-10-CM

## 2019-02-13 DIAGNOSIS — H33301 Unspecified retinal break, right eye: Secondary | ICD-10-CM

## 2019-02-13 DIAGNOSIS — H43813 Vitreous degeneration, bilateral: Secondary | ICD-10-CM

## 2019-02-13 DIAGNOSIS — H353231 Exudative age-related macular degeneration, bilateral, with active choroidal neovascularization: Secondary | ICD-10-CM | POA: Diagnosis not present

## 2019-02-16 ENCOUNTER — Other Ambulatory Visit: Payer: Self-pay | Admitting: Cardiology

## 2019-02-20 ENCOUNTER — Encounter: Payer: Self-pay | Admitting: Physical Therapy

## 2019-03-09 ENCOUNTER — Telehealth: Payer: Self-pay | Admitting: Family Medicine

## 2019-03-09 ENCOUNTER — Encounter: Payer: Self-pay | Admitting: Family Medicine

## 2019-03-09 DIAGNOSIS — E538 Deficiency of other specified B group vitamins: Secondary | ICD-10-CM | POA: Insufficient documentation

## 2019-03-09 NOTE — Telephone Encounter (Signed)
Patient ok to schedule b12?

## 2019-03-09 NOTE — Telephone Encounter (Signed)
Called pt to give him the update and he stated that he has an appt on 03/15/2019. Pt was told that after his virtual appt. I he could come to the office and have someone give him the B12 at his car. Pt stated he dont know anything about a virtual visit and dose not have any compatible technology. Pt stated that his daughter may have a smart phone but is unable to help them due to her working hrs. Pt also has concerns of Bp readings stating that they have dropped very low. Pt was ask to read me the readings which were- 111/56 131/63 123/54 I advise pt that the readings look good besides the diastolic readings are a "hair" low. Pt stated when they drop to these numbers his sx are burning sensation in arms eye and head pain. Pt claims when he wakes up he feels fine but when it starts dropping in the afternoon that's when the sx start.   Please advise on the Telemedicine visit

## 2019-03-09 NOTE — Telephone Encounter (Signed)
Schedule for a phone visit? Is there another option you are asking me to consider? Make sure still gets his b12

## 2019-03-09 NOTE — Telephone Encounter (Signed)
Yes thanks- have him wear mask- do car visit- and make sure he doesn't have any potential covid symptoms.

## 2019-03-09 NOTE — Telephone Encounter (Signed)
OK to schedule B12 injection?

## 2019-03-09 NOTE — Telephone Encounter (Signed)
Copied from [place] [id number]. Topic: Appointment Scheduling - Scheduling Inquiry for Clinic >> Mar 09, 2019  9:12 AM Virl Axe D wrote: Reason for CRM: Pt called to schedule B12 injection. 3 attempts on FC line with no answer. Please reach out to pt. CB#[phone]

## 2019-03-10 NOTE — Telephone Encounter (Signed)
Noted! Pt notified that the televisit will be okay. Pt also was told to come after his appt. To have B12 injection. No further action needed!

## 2019-03-13 ENCOUNTER — Other Ambulatory Visit: Payer: Self-pay

## 2019-03-13 ENCOUNTER — Encounter (INDEPENDENT_AMBULATORY_CARE_PROVIDER_SITE_OTHER): Payer: Medicare Other | Admitting: Ophthalmology

## 2019-03-13 DIAGNOSIS — H35033 Hypertensive retinopathy, bilateral: Secondary | ICD-10-CM

## 2019-03-13 DIAGNOSIS — H33301 Unspecified retinal break, right eye: Secondary | ICD-10-CM | POA: Diagnosis not present

## 2019-03-13 DIAGNOSIS — D3132 Benign neoplasm of left choroid: Secondary | ICD-10-CM

## 2019-03-13 DIAGNOSIS — I10 Essential (primary) hypertension: Secondary | ICD-10-CM

## 2019-03-13 DIAGNOSIS — H43813 Vitreous degeneration, bilateral: Secondary | ICD-10-CM | POA: Diagnosis not present

## 2019-03-13 DIAGNOSIS — H353231 Exudative age-related macular degeneration, bilateral, with active choroidal neovascularization: Secondary | ICD-10-CM | POA: Diagnosis not present

## 2019-03-15 ENCOUNTER — Ambulatory Visit (INDEPENDENT_AMBULATORY_CARE_PROVIDER_SITE_OTHER): Payer: Medicare Other | Admitting: Family Medicine

## 2019-03-15 ENCOUNTER — Encounter: Payer: Self-pay | Admitting: Family Medicine

## 2019-03-15 VITALS — BP 124/68 | HR 58 | Ht 73.0 in | Wt 166.0 lb

## 2019-03-15 DIAGNOSIS — Z7189 Other specified counseling: Secondary | ICD-10-CM

## 2019-03-15 DIAGNOSIS — N401 Enlarged prostate with lower urinary tract symptoms: Secondary | ICD-10-CM

## 2019-03-15 DIAGNOSIS — E785 Hyperlipidemia, unspecified: Secondary | ICD-10-CM

## 2019-03-15 DIAGNOSIS — K219 Gastro-esophageal reflux disease without esophagitis: Principal | ICD-10-CM

## 2019-03-15 DIAGNOSIS — I10 Essential (primary) hypertension: Secondary | ICD-10-CM

## 2019-03-15 DIAGNOSIS — R351 Nocturia: Secondary | ICD-10-CM

## 2019-03-15 DIAGNOSIS — E538 Deficiency of other specified B group vitamins: Secondary | ICD-10-CM | POA: Diagnosis not present

## 2019-03-15 MED ORDER — CYANOCOBALAMIN 1000 MCG/ML IJ SOLN
1000.0000 ug | Freq: Once | INTRAMUSCULAR | Status: AC
  Administered 2019-03-15: 1000 ug via INTRAMUSCULAR

## 2019-03-15 NOTE — Patient Instructions (Addendum)
Health Maintenance Due  Topic Date Due  . TETANUS/TDAP Please stop by your local pharmacy when [facility] calms  10/09/2018    Depression screen [facility] 2/9 06/16/2018 06/09/2018 06/02/2017  Decreased Interest 0 0 0  Down, Depressed, Hopeless 0 0 0  PHQ - 2 Score 0 0 0   Phone visit

## 2019-03-15 NOTE — Progress Notes (Signed)
Phone [phone]   Subjective:  Virtual visit via phonenote This visit type was conducted due to national recommendations for restrictions regarding the COVID-19 Pandemic (e.g. social distancing).  This format is felt to be most appropriate for this patient at this time balancing risks to patient and risks to population by having him in for in person visit.  All issues noted in this document were discussed and addressed.  No physical exam was performed (except for noted visual exam or audio findings with Telehealth visits).  The patient has consented to conduct a Telehealth visit and understands insurance will be billed.   Our team/I connected with Joseph Simpson on 03/15/19 at  8:20 AM EDT by phone (patient did not have equipment for webex) and verified that I am speaking with the correct person using two identifiers.  Location patient: [place] Location provider: [facility], office Persons participating in the virtual visit:  patient  Time on phone: 15 minutes Counseling provided about covid 19, blood pressure and goals  Our team/I discussed the limitations of evaluation and management by telemedicine and the availability of in person appointments. In light of current covid-19 pandemic, patient also understands that we are trying to protect them by minimizing in office contact if at all possible.  The patient expressed consent for telemedicine visit and agreed to proceed. Patient understands insurance will be billed.   ROS- some lightheadedness and headache if BP low. No chest pain or shortness of breath.   Past Medical History-  Patient Active Problem List   Diagnosis Date Noted  . CAD (coronary artery disease)     Priority: High  . Macular degeneration 11/17/2011    Priority: High  . B12 deficiency 03/09/2019    Priority: Medium  . Hyperlipidemia 06/10/2016    Priority: Medium  . BPH (benign prostatic hyperplasia) 11/23/2014    Priority: Medium  . Palpitations 07/31/2014   Priority: Medium  . Dyspnea 02/27/2014    Priority: Medium  . History of melanoma- arm 09/26/2007    Priority: Medium  . Essential hypertension 09/23/2007    Priority: Medium  . GERD 09/23/2007    Priority: Medium  . PAC (premature atrial contraction) 10/25/2015    Priority: Low  . 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 Cough 05/10/2014    Priority: Low  . Pulmonary nodule 05/10/2014    Priority: Low  . PVC's (premature ventricular contractions) 09/27/2013    Priority: Low  . Abnormal stress test 10/16/2012    Priority: Low  . Murmur 02/10/2012    Priority: Low  . Nephrolithiasis 09/04/2011    Priority: Low  . Obstructive sleep apnea 09/23/2007    Priority: Low  . Osteoarthritis of left hip 01/30/2019  . 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 Syncope   . History of hiatal hernia   . Left inguinal hernia 07/12/2016    Medications- reviewed and updated Current Outpatient Medications  Medication Sig Dispense Refill  . albuterol (PROAIR HFA) 108 (90 Base) MCG/ACT inhaler Inhale 2 puffs into the lungs every 6 (six) hours as needed for wheezing or shortness of breath. 1 Inhaler 3  . amLODipine (NORVASC) 2.5 MG tablet TAKE ONE TABLET BY MOUTH ONE TIME DAILY  90 tablet 1  . atorvastatin (LIPITOR) 40 MG tablet Take 1 tablet, po, once a week 13 tablet 3  . bevacizumab (AVASTIN) 2.5 mg/0.1 mL SOLN 2.5 mg by Intravitreal route. Injection to eye every 8 weeks -  use with vigamox    . losartan (COZAAR) 100 MG tablet TAKE HALF TABLET BY MOUTH TWICE DAILY  90 tablet 1  . metoCLOPramide (REGLAN) 10 MG tablet Take 5 mg by mouth 2 (two) times daily.    . metoprolol tartrate (LOPRESSOR) 25 MG tablet TAKE ONE TABLET BY MOUTH TWICE DAILY  60 tablet 0  . pantoprazole (PROTONIX) 40 MG tablet Take 40 mg by mouth 2 (two) times daily.     [name] Glycol-Propyl Glycol (SYSTANE ULTRA) 0.4-0.3 % SOLN Place 1 drop into both eyes 3 (three)  times daily.     Marland Kitchen VIGAMOX 0.5 % ophthalmic solution Apply 1 drop to eye as directed. Take every 8 weeks as directed - use with avastin     No current facility-administered medications for this visit.      Objective:  BP 124/68   Pulse (!) 58   Ht 6\' 1"  (1.854 m)   Wt 166 lb (75.3 kg)   BMI 21.90 kg/m  nonlabored voice Normal speech     Assessment and Plan   #hypertension S: controlled on amlodipine 2.5 mg and losartan 50mg  BID and metoprolol 25 mg BID. He states around noon will drop down to around 100-110s over 50s and feels some lightheadedness/headaches.  BP Readings from Last 3 Encounters:  03/15/19 124/68  12/13/18 110/60  06/16/18 130/70  A/P: we are going to continue losartan 50mg  BID and metoprolol 25 mg BID- we are going to trial half tablet of amblodipine for now to see if that helps with lower BPs- if it does not and still having symptoms and BP <125/65 he may stop pill completely. We also discussed following up in 2 weeks for another phone visit to check in.   #hyperlipidemia S: poorly controlled on last check- we started atorvastatin 40mg  once a week. Gets a burning sensation in his arms for a day or so after taking it.  Lab Results  Component Value Date   CHOL 179 12/13/2018   HDL 32.10 (L) 12/13/2018   LDLCALC 117 (H) 12/13/2018   TRIG 147.0 12/13/2018   CHOLHDL 6 12/13/2018   A/P: Hopefully better controlled now on statin- due to side effects of burning in arms- we are going to try half tablet of atorvastatin 40mg  twice a week instead of all at once. If this doesn't work- he can go back to once a week. He thinks he can tolerate the side effect if it lowers his cardiac risk.   B12 deficiency S: Does not tolerate vitamins- causes upset stomach Lab Results  Component Value Date   VITAMINB12 167 (L) 12/13/2018  A/P: needs b12 next labs- he will come by today for b12 injection and schedule for a month   Other notes: 1.covid 19 counseling- Staying at home  for most part- goes out once every 2-3 weeks to get groceries- using a mask and sanitizer. Granddaughter is nurse at [employer] and has been supporting him  2. Our team will call to schedule 2 week visit to check in on blood pressure  Future Appointments  Date Time Provider [place]  04/17/2019  7:45 AM Hayden Pedro, MD [facility] None  06/20/2019 10:40 AM Marin Olp, MD [facility]  06/22/2019  1:00 PM [facility] HEALTH COACH [facility]   Lab/Order associations: Gastroesophageal reflux disease without esophagitis  Benign prostatic hyperplasia with nocturia  B12 deficiency  Educated About Covid-19 Virus Infection  Return precautions advised.  Garret Reddish, MD

## 2019-03-15 NOTE — Assessment & Plan Note (Signed)
S: Does not tolerate vitamins- causes upset stomach Lab Results  Component Value Date   VITAMINB12 167 (L) 12/13/2018  A/P: needs b12 next labs- he will come by today for b12 injection and schedule for a month

## 2019-03-15 NOTE — Addendum Note (Signed)
Addended by: Gwenyth Ober R on: 03/15/2019 12:31 PM   Modules accepted: Orders

## 2019-03-20 ENCOUNTER — Other Ambulatory Visit: Payer: Self-pay | Admitting: Cardiology

## 2019-03-23 ENCOUNTER — Telehealth: Payer: Self-pay | Admitting: Cardiology

## 2019-03-23 MED ORDER — METOPROLOL TARTRATE 25 MG PO TABS: 25.0000 mg | ORAL_TABLET | Freq: Two times a day (BID) | ORAL | 3 refills | Status: DC

## 2019-03-23 NOTE — Telephone Encounter (Signed)
°  Pharmacy calling to verify instructions  Pt c/o medication issue:  1. Name of Medication: metoprolol tartrate (LOPRESSOR) 25 MG tablet  2. How are you currently taking this medication (dosage and times per day)?  3. Are you having a reaction (difficulty breathing--STAT)? NO  4. What is your medication issue? VERIFY INSTRCUTIONS

## 2019-03-23 NOTE — Telephone Encounter (Signed)
The patient states he has taken metoprolol 25 mg twice daily for years. His last refill was written for once daily and he is out of the medication. Reviewed Dr. Ansel Bong note from 4/29 and the patient was indeed taking metoprolol 25 BID. Corrected order for metoprolol 25 mg BID called in.

## 2019-03-29 ENCOUNTER — Ambulatory Visit (INDEPENDENT_AMBULATORY_CARE_PROVIDER_SITE_OTHER): Payer: Medicare Other | Admitting: Family Medicine

## 2019-03-29 ENCOUNTER — Telehealth: Payer: Self-pay | Admitting: Physician Assistant

## 2019-03-29 ENCOUNTER — Encounter: Payer: Self-pay | Admitting: Family Medicine

## 2019-03-29 VITALS — BP 130/69 | HR 58 | Ht 73.0 in | Wt 170.0 lb

## 2019-03-29 DIAGNOSIS — R0981 Nasal congestion: Secondary | ICD-10-CM

## 2019-03-29 DIAGNOSIS — I10 Essential (primary) hypertension: Secondary | ICD-10-CM | POA: Diagnosis not present

## 2019-03-29 DIAGNOSIS — Z7189 Other specified counseling: Secondary | ICD-10-CM | POA: Diagnosis not present

## 2019-03-29 DIAGNOSIS — E785 Hyperlipidemia, unspecified: Secondary | ICD-10-CM

## 2019-03-29 MED ORDER — FLUTICASONE PROPIONATE 50 MCG/ACT NA SUSP: 2.0000 | Freq: Every day | NASAL | 3 refills | Status: DC

## 2019-03-29 NOTE — Assessment & Plan Note (Signed)
S: controlled on losartan 50mg  BID, metoprolol 25mg  BID. Home blood pressures have not got down intot he 78L diastolic- he has felt better overall with fewer lightheaded/headache episodes. We were going to do a half tablet of amlodipine 2.5mg  but he has not even needed that to keep blood pressure <140/90 on average BP Readings from Last 3 Encounters:  03/29/19 (!) 141/63  03/15/19 124/68  12/13/18 110/60  A/P: well controlled even without amlodipine- continue losartan 50mg  BID, and metoprolol 25mg  BID- if blood pressure consistently gets above 140/90 on average he will let me know and we may adjust

## 2019-03-29 NOTE — Progress Notes (Signed)
Phone [phone]   Subjective:  Virtual visit via phonenote Chief Complaint  Patient presents with  . Hypertension    This visit type was conducted due to national recommendations for restrictions regarding the COVID-19 Pandemic (e.g. social distancing).  This format is felt to be most appropriate for this patient at this time balancing risks to patient and risks to population by having him in for in person visit.  All issues noted in this document were discussed and addressed.  No physical exam was performed (except for noted visual exam or audio findings with Telehealth visits).  The patient has consented to conduct a Telehealth visit and understands insurance will be billed.   Our team/I connected with Joseph Simpson on 03/29/19 at  8:40 AM EDT by phone (patient did not have equipment for webex) and verified that I am speaking with the correct person using two identifiers.  Location patient: [place] Location provider: [facility], office Persons participating in the virtual visit:  patient  Time on phone: 15 minutes Counseling provided about covid 19, bp goals, side effect management  Our team/I discussed the limitations of evaluation and management by telemedicine and the availability of in person appointments. In light of current covid-19 pandemic, patient also understands that we are trying to protect them by minimizing in office contact if at all possible.  The patient expressed consent for telemedicine visit and agreed to proceed. Patient understands insurance will be billed.   ROS- no chest pain. No edema. Does have some sinus drainage/post nasal drip   Past Medical History-  Patient Active Problem List   Diagnosis Date Noted  . CAD (coronary artery disease)     Priority: High  . Macular degeneration 11/17/2011    Priority: High  . B12 deficiency 03/09/2019    Priority: Medium  . Hyperlipidemia 06/10/2016    Priority: Medium  . BPH (benign prostatic hyperplasia)  11/23/2014    Priority: Medium  . Palpitations 07/31/2014    Priority: Medium  . Dyspnea 02/27/2014    Priority: Medium  . History of melanoma- arm 09/26/2007    Priority: Medium  . Essential hypertension 09/23/2007    Priority: Medium  . GERD 09/23/2007    Priority: Medium  . PAC (premature atrial contraction) 10/25/2015    Priority: Low  . 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 Cough 05/10/2014    Priority: Low  . Pulmonary nodule 05/10/2014    Priority: Low  . PVC's (premature ventricular contractions) 09/27/2013    Priority: Low  . Abnormal stress test 10/16/2012    Priority: Low  . Murmur 02/10/2012    Priority: Low  . Nephrolithiasis 09/04/2011    Priority: Low  . Obstructive sleep apnea 09/23/2007    Priority: Low  . Osteoarthritis of left hip 01/30/2019  . 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 Syncope   . History of hiatal hernia   . Left inguinal hernia 07/12/2016    Medications- reviewed and updated Current Outpatient Medications  Medication Sig Dispense Refill  . albuterol (PROAIR HFA) 108 (90 Base) MCG/ACT inhaler Inhale 2 puffs into the lungs every 6 (six) hours as needed for wheezing or shortness of breath. 1 Inhaler 3  . atorvastatin (LIPITOR) 40 MG tablet Take 1 tablet, po, once a week 13 tablet 3  . bevacizumab (AVASTIN) 2.5 mg/0.1 mL SOLN 2.5 mg by Intravitreal route. Injection to eye every 8 weeks - use with vigamox    .  losartan (COZAAR) 100 MG tablet TAKE HALF TABLET BY MOUTH TWICE DAILY  90 tablet 1  . metoCLOPramide (REGLAN) 10 MG tablet Take 5 mg by mouth 2 (two) times daily.    . metoprolol tartrate (LOPRESSOR) 25 MG tablet Take 1 tablet (25 mg total) by mouth 2 (two) times daily. Please schedule an appointment for further refills. 60 tablet 3  . pantoprazole (PROTONIX) 40 MG tablet Take 40 mg by mouth 2 (two) times daily.     [name] Glycol-Propyl Glycol (SYSTANE ULTRA) 0.4-0.3 %  SOLN Place 1 drop into both eyes 3 (three) times daily.     Marland Kitchen VIGAMOX 0.5 % ophthalmic solution Apply 1 drop to eye as directed. Take every 8 weeks as directed - use with avastin    . amLODipine (NORVASC) 2.5 MG tablet TAKE ONE TABLET BY MOUTH ONE TIME DAILY  (Patient not taking: Reported on 03/29/2019) 90 tablet 1   No current facility-administered medications for this visit.      Objective:  BP 130/69   Pulse (!) 58   Ht 6\' 1"  (1.854 m)   Wt 170 lb (77.1 kg)   BMI 22.43 kg/m  self reported vitals  Nonlabored voice, normal speech      Assessment and Plan   #hypertension S: controlled on losartan 50mg  BID, metoprolol 25mg  BID. Home blood pressures have not got down intot he 19J diastolic- he has felt better overall with fewer lightheaded/headache episodes. We were going to do a half tablet of amlodipine 2.5mg  but he has not even needed that to keep blood pressure <140/90 on average BP Readings from Last 3 Encounters:  03/29/19 130/69  03/15/19 124/68  12/13/18 110/60  A/P: well controlled even without amlodipine- continue losartan 50mg  BID, and metoprolol 25mg  BID- if blood pressure consistently gets above 140/90 on average he will let me know and we may adjust  #hyperlipidemia S: patient has started 20mg  atorvastatin (half tablet of 40mg ) twice a week instead of 40mg  once a week. He is not having burning sensation in his arms with this regimen.   A/P: hopefully controlled on statin even though just 20mg  atorvastatin twice a week- will check with next in person labs  # Sinus congestion S:patient states he has sinus drainage in the mornings and coughing up through most of the day. Tends to be clear or white. No sinus pressure. Mild cough with it. Slight sore throat. Smell and taste are normal. No fever or chills or shortness of breath. Not much sneezing or watery/itchy eyes (dry eyes at baseline). Not rubbing eyes. Going on for a few months.  A/P: With ongoing duration of symptoms- I  told patient I thought an allergic cause was most likely- we will trial Flonase for at least 2 weeks to see if this makes a difference.  Given duration-strongly doubt COVID but asked him just to be safe to try to stay at home until he has improvement in his symptoms.  He is already trying to stay home anyway-we will try to get a family member to pick up his medicine.  If he develops new or worsening symptoms particularly fever or shortness of breath or sinus pressure that should let us know immediately.   Future Appointments  Date Time Provider [place]  03/31/2019 11:00 AM Almyra Deforest, [place] [facility]  04/14/2019  9:40 AM Marin Olp, MD [facility]  04/17/2019  7:45 AM Hayden Pedro, MD [facility] None  06/20/2019 10:40 AM Marin Olp, MD [facility]  06/22/2019  1:00 PM [facility] HEALTH COACH [facility]   Lab/Order associations: Essential hypertension  Hyperlipidemia, unspecified hyperlipidemia type  Sinus congestion  Meds ordered this encounter  Medications  . fluticasone (FLONASE) 50 MCG/ACT nasal spray    Sig: Place 2 sprays into both nostrils daily.    Dispense:  16 g    Refill:  3    Return precautions advised.  Garret Reddish, MD

## 2019-03-29 NOTE — Patient Instructions (Addendum)
Health Maintenance Due  Topic Date Due  . TETANUS/TDAP Discuss during visit 10/09/2018    Depression screen [facility] 2/9 06/16/2018 06/09/2018 06/02/2017  Decreased Interest 0 0 0  Down, Depressed, Hopeless 0 0 0  PHQ - 2 Score 0 0 0   Phone visit

## 2019-03-29 NOTE — Assessment & Plan Note (Signed)
S: patient has started 20mg  atorvastatin (half tablet of 40mg ) twice a week instead of 40mg  once a week. He is not having burning sensation in his arms with this regimen.   A/P: hopefully controlled on statin even though just 20mg  atorvastatin twice a week- will check with next in person labs

## 2019-03-31 ENCOUNTER — Telehealth (INDEPENDENT_AMBULATORY_CARE_PROVIDER_SITE_OTHER): Payer: Medicare Other | Admitting: Physician Assistant

## 2019-03-31 ENCOUNTER — Encounter: Payer: Self-pay | Admitting: Physician Assistant

## 2019-03-31 ENCOUNTER — Telehealth: Payer: Self-pay

## 2019-03-31 VITALS — BP 130/65 | HR 57 | Ht 73.0 in | Wt 170.0 lb

## 2019-03-31 DIAGNOSIS — R002 Palpitations: Secondary | ICD-10-CM

## 2019-03-31 DIAGNOSIS — I2510 Atherosclerotic heart disease of native coronary artery without angina pectoris: Secondary | ICD-10-CM

## 2019-03-31 DIAGNOSIS — I10 Essential (primary) hypertension: Secondary | ICD-10-CM

## 2019-03-31 DIAGNOSIS — G4733 Obstructive sleep apnea (adult) (pediatric): Secondary | ICD-10-CM

## 2019-03-31 DIAGNOSIS — E785 Hyperlipidemia, unspecified: Secondary | ICD-10-CM

## 2019-03-31 NOTE — Telephone Encounter (Signed)
Virtual Visit Pre-Appointment Phone Call  "(Name), I am calling you today to discuss your upcoming appointment. We are currently trying to limit exposure to the virus that causes COVID-19 by seeing patients at home rather than in the office."  1. "What is the BEST phone number to call the day of the visit?" - include this in appointment notes  2. "Do you have or have access to (through a family member/friend) a smartphone with video capability that we can use for your visit?" a. If yes - list this number in appt notes as "cell" (if different from BEST phone #) and list the appointment type as a VIDEO visit in appointment notes b. If no - list the appointment type as a PHONE visit in appointment notes  3. Confirm consent - "In the setting of the current Covid19 crisis, you are scheduled for a PHONE visit with your provider on 03/31/2019 at 11:00AM.  Just as we do with many in-office visits, in order for you to participate in this visit, we must obtain consent.  If you'd like, I can send this to your mychart (if signed up) or email for you to review.  Otherwise, I can obtain your verbal consent now.  All virtual visits are billed to your insurance company just like a normal visit would be.  By agreeing to a virtual visit, we'd like you to understand that the technology does not allow for your provider to perform an examination, and thus may limit your provider's ability to fully assess your condition. If your provider identifies any concerns that need to be evaluated in person, we will make arrangements to do so.  Finally, though the technology is pretty good, we cannot assure that it will always work on either your or our end, and in the setting of a video visit, we may have to convert it to a phone-only visit.  In either situation, we cannot ensure that we have a secure connection.  Are you willing to proceed?" STAFF: Did the patient verbally acknowledge consent to telehealth visit? Document YES/NO  here: YES  4. Advise patient to be prepared - "Two hours prior to your appointment, go ahead and check your blood pressure, pulse, oxygen saturation, and your weight (if you have the equipment to check those) and write them all down. When your visit starts, your provider will ask you for this information. If you have an Apple Watch or Kardia device, please plan to have heart rate information ready on the day of your appointment. Please have a pen and paper handy nearby the day of the visit as well."  5. Give patient instructions for MyChart download to smartphone OR [email or web address] as below if video visit (depending on what platform provider is using)  6. Inform patient they will receive a phone call 15 minutes prior to their appointment time (may be from unknown caller ID) so they should be prepared to answer    TELEPHONE CALL NOTE  Joseph Simpson has been deemed a candidate for a follow-up tele-health visit to limit community exposure during the Covid-19 pandemic. I spoke with the patient via phone to ensure availability of phone/video source, confirm preferred email & phone number, and discuss instructions and expectations.  I reminded Joseph Simpson to be prepared with any vital sign and/or heart rhythm information that could potentially be obtained via home monitoring, at the time of his visit. I reminded Joseph Simpson to expect a phone call prior to his visit.  Jacqulynn Cadet, [place] 03/31/2019 11:48 AM   INSTRUCTIONS FOR DOWNLOADING THE MYCHART APP TO SMARTPHONE  - The patient must first make sure to have activated MyChart and know their login information - If Apple, go to [employer] and type in MyChart in the search bar and download the app. If Android, ask patient to go to [employer] and type in [place] in the search bar and download the app. The app is free but as with any other app downloads, their phone may require them to verify saved payment information or Apple/Android  password.  - The patient will need to then log into the app with their MyChart username and password, and select [facility] as their healthcare provider to link the account. When it is time for your visit, go to the MyChart app, find appointments, and click Begin Video Visit. Be sure to Select Allow for your device to access the Microphone and Camera for your visit. You will then be connected, and your provider will be with you shortly.  **If they have any issues connecting, or need assistance please contact MyChart service desk [phone])**  **If using a computer, in order to ensure the best quality for their visit they will need to use either of the following Internet Browsers: [employer], or Google Chrome**  IF USING DOXIMITY or DOXY.ME - The patient will receive a link just prior to their visit by text.     FULL LENGTH CONSENT FOR TELE-HEALTH VISIT   I hereby voluntarily request, consent and authorize [facility] and its employed or contracted physicians, physician assistants, nurse practitioners or other licensed health care professionals (the Practitioner), to provide me with telemedicine health care services (the "Services") as deemed necessary by the treating Practitioner. I acknowledge and consent to receive the Services by the Practitioner via telemedicine. I understand that the telemedicine visit will involve communicating with the Practitioner through live audiovisual communication technology and the disclosure of certain medical information by electronic transmission. I acknowledge that I have been given the opportunity to request an in-person assessment or other available alternative prior to the telemedicine visit and am voluntarily participating in the telemedicine visit.  I understand that I have the right to withhold or withdraw my consent to the use of telemedicine in the course of my care at any time, without affecting my right to future care or treatment,  and that the Practitioner or I may terminate the telemedicine visit at any time. I understand that I have the right to inspect all information obtained and/or recorded in the course of the telemedicine visit and may receive copies of available information for a reasonable fee.  I understand that some of the potential risks of receiving the Services via telemedicine include:  Marland Kitchen Delay or interruption in medical evaluation due to technological equipment failure or disruption; . Information transmitted may not be sufficient (e.g. poor resolution of images) to allow for appropriate medical decision making by the Practitioner; and/or  . In rare instances, security protocols could fail, causing a breach of personal health information.  Furthermore, I acknowledge that it is my responsibility to provide information about my medical history, conditions and care that is complete and accurate to the best of my ability. I acknowledge that Practitioner's advice, recommendations, and/or decision may be based on factors not within their control, such as incomplete or inaccurate data provided by me or distortions of diagnostic images or specimens that may result from electronic transmissions. I understand that the  practice of medicine is not an Chief Strategy Officer and that Practitioner makes no warranties or guarantees regarding treatment outcomes. I acknowledge that I will receive a copy of this consent concurrently upon execution via email to the email address I last provided but may also request a printed copy by calling the office of [facility].    I understand that my insurance will be billed for this visit.   I have read or had this consent read to me. . I understand the contents of this consent, which adequately explains the benefits and risks of the Services being provided via telemedicine.  . I have been provided ample opportunity to ask questions regarding this consent and the Services and have had my questions  answered to my satisfaction. . I give my informed consent for the services to be provided through the use of telemedicine in my medical care  By participating in this telemedicine visit I agree to the above.

## 2019-03-31 NOTE — Progress Notes (Signed)
Virtual Visit via Telephone Note   This visit type was conducted due to national recommendations for restrictions regarding the COVID-19 Pandemic (e.g. social distancing) in an effort to limit this patient's exposure and mitigate transmission in our community.  Due to his co-morbid illnesses, this patient is at least at moderate risk for complications without adequate follow up.  This format is felt to be most appropriate for this patient at this time.  The patient did not have access to video technology/had technical difficulties with video requiring transitioning to audio format only (telephone).  All issues noted in this document were discussed and addressed.  No physical exam could be performed with this format.  Please refer to the patient's chart for his  consent to telehealth for [facility].   Date:  03/31/2019   ID:  Joseph Simpson, DOB Jul 16, 1935, MRN [id number]  Patient Location: Home Provider Location: Home  PCP:  Marin Olp, MD  Cardiologist:  Peter Martinique, MD  Electrophysiologist:  None   Evaluation Performed:  Follow-Up Visit  Chief Complaint:  followup  History of Present Illness:    Joseph Simpson is a 84 y.o. male with past medical history of hypertension, PVCs, obstructive sleep apnea not on CPAP, presyncope, and nonobstructive CAD.  He had a abnormal stress test in 2013, follow-up cardiac catheterization demonstrated nonobstructive CAD.  His last follow-up was in December 2018 with Melvyn Neth, PA-C, at which time he was doing well.   Patient was contacted via telephone visit today, he is not very active at this point.  He hired someone to clean his house on a weekly basis and that there was some other people who help him to cut the grass once in a while.  He does not do strenuous activity at home.  He does walk around and denies any exertional chest pain or shortness of breath.  He attributed the previous palpitation to overexertion however he says he has  not exerted himself in the past year nor has he experienced any recurrent palpitation.  The current dose of metoprolol seems to be controlling his symptoms quite well.  Instead of taking his Lipitor 40 mg weekly, he is actually splitting the pill into half a tablet every Wednesday and a half a tablet every Saturday.  He says he has too much of a muscle ache on the 40 mg weekly dose however splitting the pill seems to alleviate the symptoms.  His last lipid panel showed borderline elevated LDL in January 2020, I will defer the next follow-up lab work to his PCP Dr. Yong Channel.  Overall he is doing quite well from cardiology perspective and can follow-up in 1 year.  The patient does not have symptoms concerning for COVID-19 infection (fever, chills, cough, or new shortness of breath).    Past Medical History:  Diagnosis Date   Abnormal stress test December 2013   s/p cardiac cath with minimal nonobstructive CAD and normal LV function; medical management recommended   Arthritis    CAD (coronary artery disease)    Dyslipidemia    Dysrhythmia    GERD (gastroesophageal reflux disease)    History of hiatal hernia    History of kidney stones    Hypertension    Macular degeneration    Melanoma ([place])    Obstructive sleep apnea 09/23/2007   Wore cpap previously. Stopped and not interested despite risks.      OSA (obstructive sleep apnea)    PAC (premature atrial contraction) 10/25/2015  PVC's (premature ventricular contractions) 09/27/2013   SVT (supraventricular tachycardia) ([place])    Syncope    Past Surgical History:  Procedure Laterality Date   APPENDECTOMY     CARDIAC CATHETERIZATION  December 2013   minimal nonobstructive CAD with normal LV function   CATARACT EXTRACTION     CYSTOSCOPY WITH LITHOLAPAXY N/A 07/13/2016   Procedure: CYSTOSCOPY WITH LITHOLAPAXY;  Surgeon: Franchot Gallo, MD;  Location: [facility];  Service: Urology;  Laterality: N/A;    CYSTOSCOPY/RETROGRADE/URETEROSCOPY/STONE EXTRACTION WITH BASKET  02/24/2012   Procedure: CYSTOSCOPY/RETROGRADE/URETEROSCOPY/STONE EXTRACTION WITH BASKET;  Surgeon: Franchot Gallo, MD;  Location: [facility];  Service: Urology;  Laterality: Bilateral;  1 HOUR   GASTRECTOMY     HEMORRHOID SURGERY     HERNIA REPAIR  07/13/2016   INGUINAL HERNIA REPAIR Left 07/13/2016   Procedure: REPAIR LEFT INGUINAL HERNIA;  Surgeon: Armandina Gemma, MD;  Location: [facility];  Service: General;  Laterality: Left;   INSERTION OF MESH Left 07/13/2016   Procedure: INSERTION OF MESH;  Surgeon: Armandina Gemma, MD;  Location: [facility];  Service: General;  Laterality: Left;   MELANOMA EXCISION  05-05-2007   LEFT FOREARM   NOSE SURGERY     TONSILLECTOMY     TOTAL SHOULDER ARTHROPLASTY Right 12/29/2017   TOTAL SHOULDER ARTHROPLASTY Right 12/29/2017   Procedure: TOTAL SHOULDER ARTHROPLASTY;  Surgeon: Hiram Gash, MD;  Location: [place];  Service: Orthopedics;  Laterality: Right;     Current Meds  Medication Sig   albuterol (PROAIR HFA) 108 (90 Base) MCG/ACT inhaler Inhale 2 puffs into the lungs every 6 (six) hours as needed for wheezing or shortness of breath.   atorvastatin (LIPITOR) 40 MG tablet Take 1 tablet, po, once a week   bevacizumab (AVASTIN) 2.5 mg/0.1 mL SOLN 2.5 mg by Intravitreal route. Injection to eye every 8 weeks - use with vigamox   fluticasone (FLONASE) 50 MCG/ACT nasal spray Place 2 sprays into both nostrils daily. (Patient taking differently: Place 2 sprays into both nostrils as needed for allergies. )   losartan (COZAAR) 100 MG tablet TAKE HALF TABLET BY MOUTH TWICE DAILY    metoCLOPramide (REGLAN) 10 MG tablet Take 5 mg by mouth 2 (two) times daily.   metoprolol tartrate (LOPRESSOR) 25 MG tablet Take 1 tablet (25 mg total) by mouth 2 (two) times daily. Please schedule an appointment for further refills.   pantoprazole (PROTONIX) 40 MG tablet Take 40 mg by mouth 2 (two) times  daily.    Polyethyl Glycol-Propyl Glycol (SYSTANE ULTRA) 0.4-0.3 % SOLN Place 1 drop into both eyes 3 (three) times daily.    VIGAMOX 0.5 % ophthalmic solution Apply 1 drop to eye as directed. Take every 8 weeks as directed - use with avastin     Allergies:   Aspirin; Percocet [oxycodone-acetaminophen]; and Vitamins [apatate]   Social History   Tobacco Use   Smoking status: Former Smoker    Packs/day: 0.10    Years: 4.00    Pack years: 0.40    Types: Cigarettes    Last attempt to quit: 09/03/1956    Years since quitting: 62.6   Smokeless tobacco: Never Used  Substance Use Topics   Alcohol use: No    Alcohol/week: 0.0 standard drinks   Drug use: No     Family Hx: The patient's family history includes Cancer in his sister; Heart disease in his mother; Lung cancer in his brother; Pneumonia in his father.  ROS:   Please see the history of present  illness.     All other systems reviewed and are negative.   Prior CV studies:   The following studies were reviewed today:  Echo 04/16/2009 1. Left ventricle: The cavity size was normal. Wall thickness was  increased in a pattern of mild LVH. Systolic function was normal.  The estimated ejection fraction was in the range of 55% to 60%.  Wall motion was normal; there were no regional wall motion  abnormalities. 2. Aortic valve: Trivial regurgitation.   Labs/Other Tests and Data Reviewed:    EKG:  An ECG dated 05/05/2018 was personally reviewed today and demonstrated:  Sinus rhythm with frequent PVCs.  Recent Labs: 12/13/2018: ALT 17; BUN 10; Creatinine, Ser 0.82; Hemoglobin 15.8; Platelets 156.0; Potassium 4.4; Sodium 140   Recent Lipid Panel Lab Results  Component Value Date/Time   CHOL 179 12/13/2018 09:53 AM   TRIG 147.0 12/13/2018 09:53 AM   HDL 32.10 (L) 12/13/2018 09:53 AM   CHOLHDL 6 12/13/2018 09:53 AM   LDLCALC 117 (H) 12/13/2018 09:53 AM    Wt Readings from Last 3 Encounters:  03/31/19 170 lb  (77.1 kg)  03/29/19 170 lb (77.1 kg)  03/15/19 166 lb (75.3 kg)     Objective:    Vital Signs:  BP 130/65    Pulse (!) 57    Ht 6\' 1"  (1.854 m)    Wt 170 lb (77.1 kg)    BMI 22.43 kg/m    VITAL SIGNS:  reviewed  ASSESSMENT & PLAN:    1. Palpitation: He has not had any significant palpitation recently.  Continue on current therapy.  2. Hypertension: Blood pressure well controlled on current therapy.  3. Hyperlipidemia: He has significant muscle ache with 40 mg weekly dose of Lipitor, therefore he is splitting his Lipitor to half a tablet every Wednesday and Saturday.  4. Obstructive sleep apnea: Not on CPAP.  5. Nonobstructive CAD: Seen on previous cardiac catheterization in 2013.  Denies any recent chest discomfort.   COVID-19 Education: The signs and symptoms of COVID-19 were discussed with the patient and how to seek care for testing (follow up with PCP or arrange E-visit).  The importance of social distancing was discussed today.  Time:   Today, I have spent 7 minutes with the patient with telehealth technology discussing the above problems.     Medication Adjustments/Labs and Tests Ordered: Current medicines are reviewed at length with the patient today.  Concerns regarding medicines are outlined above.   Tests Ordered: No orders of the defined types were placed in this encounter.   Medication Changes: No orders of the defined types were placed in this encounter.   Disposition:  Follow up in 1 year(s)  Signed, Almyra Deforest, PA  03/31/2019 11:21 AM    [facility]

## 2019-03-31 NOTE — Patient Instructions (Addendum)
Medication Instructions:   Your physician recommends that you continue on your current medications as directed. Please refer to the Current Medication list given to you today.  If you need a refill on your cardiac medications before your next appointment, please call your pharmacy.   Lab work:  NONE ordered at this time of appointment   If you have labs (blood work) drawn today and your tests are completely normal, you will receive your results only by: Marland Kitchen MyChart Message (if you have MyChart) OR . A paper copy in the mail If you have any lab test that is abnormal or we need to change your treatment, we will call you to review the results.  Testing/Procedures:  NONE ordered at this time of appointment   Follow-Up: At [facility], you and your health needs are our priority.  As part of our continuing mission to provide you with exceptional heart care, we have created designated Provider Care Teams.  These Care Teams include your primary Cardiologist (physician) and Advanced Practice Providers (APPs -  Physician Assistants and Nurse Practitioners) who all work together to provide you with the care you need, when you need it. You will need a follow up appointment in 12 months (May 2021).  Please call our office in March 2021 to schedule this appointment.  You may see Peter Martinique, MD or one of the following Advanced Practice Providers on your designated Care Team: [place], [place] . Fabian Sharp, PA-C  Any Other Special Instructions Will Be Listed Below (If Applicable).

## 2019-04-14 ENCOUNTER — Ambulatory Visit (INDEPENDENT_AMBULATORY_CARE_PROVIDER_SITE_OTHER): Payer: Medicare Other | Admitting: Family Medicine

## 2019-04-14 ENCOUNTER — Other Ambulatory Visit: Payer: Self-pay

## 2019-04-14 DIAGNOSIS — E538 Deficiency of other specified B group vitamins: Secondary | ICD-10-CM

## 2019-04-14 MED ORDER — CYANOCOBALAMIN 1000 MCG/ML IJ SOLN
1000.0000 ug | Freq: Once | INTRAMUSCULAR | Status: AC
  Administered 2019-04-14: 1000 ug via INTRAMUSCULAR

## 2019-04-14 NOTE — Progress Notes (Signed)
Per orders of Dr. Yong Channel, injection of B12 given by Verline Lema L Tyus in right deltoid. Patient tolerated injection well. Patient will make appointment for 78month  I have reviewed and agree with note, evaluation, plan.   Garret Reddish, MD

## 2019-04-14 NOTE — Patient Instructions (Signed)
Health Maintenance Due  Topic Date Due  . TETANUS/TDAP Pt notified that he is due for TD vaccine 10/09/2018    Depression screen [facility] 2/9 06/16/2018 06/09/2018 06/02/2017  Decreased Interest 0 0 0  Down, Depressed, Hopeless 0 0 0  PHQ - 2 Score 0 0 0

## 2019-04-17 ENCOUNTER — Encounter (INDEPENDENT_AMBULATORY_CARE_PROVIDER_SITE_OTHER): Payer: Medicare Other | Admitting: Ophthalmology

## 2019-04-17 ENCOUNTER — Other Ambulatory Visit: Payer: Self-pay

## 2019-04-17 DIAGNOSIS — H43813 Vitreous degeneration, bilateral: Secondary | ICD-10-CM

## 2019-04-17 DIAGNOSIS — H353231 Exudative age-related macular degeneration, bilateral, with active choroidal neovascularization: Secondary | ICD-10-CM

## 2019-04-17 DIAGNOSIS — H33301 Unspecified retinal break, right eye: Secondary | ICD-10-CM

## 2019-04-17 DIAGNOSIS — H35033 Hypertensive retinopathy, bilateral: Secondary | ICD-10-CM

## 2019-04-17 DIAGNOSIS — I10 Essential (primary) hypertension: Secondary | ICD-10-CM | POA: Diagnosis not present

## 2019-04-17 DIAGNOSIS — D3132 Benign neoplasm of left choroid: Secondary | ICD-10-CM | POA: Diagnosis not present

## 2019-05-02 DIAGNOSIS — Z86010 Personal history of colonic polyps: Secondary | ICD-10-CM | POA: Diagnosis not present

## 2019-05-02 DIAGNOSIS — K219 Gastro-esophageal reflux disease without esophagitis: Secondary | ICD-10-CM | POA: Diagnosis not present

## 2019-05-02 DIAGNOSIS — K3184 Gastroparesis: Secondary | ICD-10-CM | POA: Diagnosis not present

## 2019-05-15 ENCOUNTER — Ambulatory Visit (INDEPENDENT_AMBULATORY_CARE_PROVIDER_SITE_OTHER): Payer: Medicare Other

## 2019-05-15 ENCOUNTER — Other Ambulatory Visit: Payer: Self-pay

## 2019-05-15 ENCOUNTER — Ambulatory Visit: Payer: Medicare Other | Admitting: Family Medicine

## 2019-05-15 DIAGNOSIS — E538 Deficiency of other specified B group vitamins: Secondary | ICD-10-CM | POA: Diagnosis not present

## 2019-05-15 MED ORDER — CYANOCOBALAMIN 1000 MCG/ML IJ SOLN
1000.0000 ug | Freq: Once | INTRAMUSCULAR | Status: AC
  Administered 2019-05-15: 1000 ug via INTRAMUSCULAR

## 2019-05-15 NOTE — Progress Notes (Signed)
Per orders of Dr. Hunter, injection of Vitamin b12 1000 mcg given left deltoid IM by Shianna Bally M Loraine Bhullar, CMA Patient tolerated injection well.  He will return in 1 month for his next injection  

## 2019-05-15 NOTE — Progress Notes (Signed)
I have reviewed and agree with note, evaluation, plan.   Stephen Hunter, MD  

## 2019-05-16 NOTE — Telephone Encounter (Signed)
Opened in error

## 2019-05-19 ENCOUNTER — Other Ambulatory Visit: Payer: Self-pay | Admitting: Family Medicine

## 2019-05-22 ENCOUNTER — Encounter (INDEPENDENT_AMBULATORY_CARE_PROVIDER_SITE_OTHER): Payer: Medicare Other | Admitting: Ophthalmology

## 2019-05-22 ENCOUNTER — Other Ambulatory Visit: Payer: Self-pay

## 2019-05-22 DIAGNOSIS — H33301 Unspecified retinal break, right eye: Secondary | ICD-10-CM | POA: Diagnosis not present

## 2019-05-22 DIAGNOSIS — H43813 Vitreous degeneration, bilateral: Secondary | ICD-10-CM | POA: Diagnosis not present

## 2019-05-22 DIAGNOSIS — D3132 Benign neoplasm of left choroid: Secondary | ICD-10-CM

## 2019-05-22 DIAGNOSIS — I10 Essential (primary) hypertension: Secondary | ICD-10-CM

## 2019-05-22 DIAGNOSIS — H353231 Exudative age-related macular degeneration, bilateral, with active choroidal neovascularization: Secondary | ICD-10-CM

## 2019-05-22 DIAGNOSIS — H35033 Hypertensive retinopathy, bilateral: Secondary | ICD-10-CM | POA: Diagnosis not present

## 2019-06-06 ENCOUNTER — Other Ambulatory Visit: Payer: Self-pay | Admitting: Family Medicine

## 2019-06-13 ENCOUNTER — Ambulatory Visit (INDEPENDENT_AMBULATORY_CARE_PROVIDER_SITE_OTHER): Payer: Medicare Other

## 2019-06-13 ENCOUNTER — Other Ambulatory Visit: Payer: Self-pay

## 2019-06-13 DIAGNOSIS — E538 Deficiency of other specified B group vitamins: Secondary | ICD-10-CM | POA: Diagnosis not present

## 2019-06-13 DIAGNOSIS — H04123 Dry eye syndrome of bilateral lacrimal glands: Secondary | ICD-10-CM | POA: Diagnosis not present

## 2019-06-13 DIAGNOSIS — H16122 Filamentary keratitis, left eye: Secondary | ICD-10-CM | POA: Diagnosis not present

## 2019-06-13 MED ORDER — CYANOCOBALAMIN 1000 MCG/ML IJ SOLN
1000.0000 ug | Freq: Once | INTRAMUSCULAR | Status: AC
  Administered 2019-06-13: 1000 ug via INTRAMUSCULAR

## 2019-06-13 NOTE — Progress Notes (Signed)
Per orders of Dr.Wallace, injection of Vitamin B 12  given by Loralyn Freshwater.Given in Left deltoid.Patient tolerated injection well.

## 2019-06-19 NOTE — Progress Notes (Signed)
Phone [phone]   Subjective:  Joseph Simpson is a 84 y.o. year old very pleasant male patient who presents for/with See problem oriented charting Chief Complaint  Patient presents with  . Follow-up    Fasting today.   . Hypertension  . Hyperlipidemia  . Gastroesophageal Reflux  . Vitamin B12 Deficiency   ROS-   Denies HA, dizziness, CP, SOB, visual changes. Using cane some for left hip pain/arthritis. Occasional pain into left leg- discussed could have lumbar arthritis  Past Medical History-  Patient Active Problem List   Diagnosis Date Noted  . CAD (coronary artery disease)     Priority: High  . Macular degeneration 11/17/2011    Priority: High  . B12 deficiency 03/09/2019    Priority: Medium  . Hyperlipidemia 06/10/2016    Priority: Medium  . BPH (benign prostatic hyperplasia) 11/23/2014    Priority: Medium  . Palpitations 07/31/2014    Priority: Medium  . Dyspnea 02/27/2014    Priority: Medium  . History of melanoma- arm 09/26/2007    Priority: Medium  . Essential hypertension 09/23/2007    Priority: Medium  . GERD 09/23/2007    Priority: Medium  . PAC (premature atrial contraction) 10/25/2015    Priority: Low  . 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 Cough 05/10/2014    Priority: Low  . Pulmonary nodule 05/10/2014    Priority: Low  . PVC's (premature ventricular contractions) 09/27/2013    Priority: Low  . Abnormal stress test 10/16/2012    Priority: Low  . Murmur 02/10/2012    Priority: Low  . Nephrolithiasis 09/04/2011    Priority: Low  . Obstructive sleep apnea 09/23/2007    Priority: Low  . Osteoarthritis of left hip 01/30/2019  . 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 Syncope   . History of hiatal hernia   . Left inguinal hernia 07/12/2016    Medications- reviewed and updated Current Outpatient Medications  Medication Sig Dispense Refill  . albuterol (PROAIR HFA) 108 (90 Base)  MCG/ACT inhaler Inhale 2 puffs into the lungs every 6 (six) hours as needed for wheezing or shortness of breath. 1 Inhaler 3  . atorvastatin (LIPITOR) 40 MG tablet Take 1 tablet, po, once a week 13 tablet 3  . bevacizumab (AVASTIN) 2.5 mg/0.1 mL SOLN 2.5 mg by Intravitreal route. Injection to eye every 8 weeks - use with vigamox    . fluticasone (FLONASE) 50 MCG/ACT nasal spray Place 2 sprays into both nostrils daily. (Patient taking differently: Place 2 sprays into both nostrils as needed for allergies. ) 16 g 3  . losartan (COZAAR) 100 MG tablet TAKE HALF TABLET BY MOUTH TWICE DAILY  90 tablet 1  . metoCLOPramide (REGLAN) 10 MG tablet Take 5 mg by mouth 2 (two) times daily.    . metoprolol tartrate (LOPRESSOR) 25 MG tablet Take 1 tablet (25 mg total) by mouth 2 (two) times daily. Please schedule an appointment for further refills. 60 tablet 3  . pantoprazole (PROTONIX) 40 MG tablet Take 40 mg by mouth 2 (two) times daily.     [name] Glycol-Propyl Glycol (SYSTANE ULTRA) 0.4-0.3 % SOLN Place 1 drop into both eyes 3 (three) times daily.     Marland Kitchen VIGAMOX 0.5 % ophthalmic solution Apply 1 drop to eye as directed. Take every 8 weeks as directed - use with avastin     No current facility-administered medications for this visit.  Objective:  BP 130/72 (BP Location: Left Arm, Patient Position: Sitting, Cuff Size: Normal)   Pulse 62   Temp 98 F (36.7 C) (Oral)   Ht 6\' 1"  (1.854 m)   Wt 181 lb 12.8 oz (82.5 kg)   SpO2 97%   BMI 23.99 kg/m  Gen: NAD, resting comfortably CV: RRR no murmurs rubs or gallops Lungs: CTAB no crackles, wheeze, rhonchi Abdomen: soft/nontender/nondistended/normal bowel sounds Skin: warm, dry MSK-walking with cane now due to hip pain    Assessment and Plan   #hypertension S: controlled on Losartan 100 mg 1/2 tablet BID and Metoprolol 25 mg BID. No missed doses, no side effects. Limiting salt intake, avoiding processed foods.. Checking BP at home, occasionally  above 140/90, in the 150s/90s but for the most part well controlled BP Readings from Last 3 Encounters:  06/20/19 130/72  03/31/19 130/65  03/29/19 130/69  A/P: Stable. Continue current medications.   #hyperlipidemia/CAD S: Minimal nonobstructive CAD on cath 2013.  Poorly controlled on Atorvastatin 40 mg once weekly-actually takes half tablet twice a week-have burning in his arms with 40 mg once daily. No missed doses, no side effects. Not on aspirin due to GI issues Lab Results  Component Value Date   CHOL 179 12/13/2018   HDL 32.10 (L) 12/13/2018   LDLCALC 117 (H) 12/13/2018   TRIG 147.0 12/13/2018   CHOLHDL 6 12/13/2018   A/P: likely mild poor control but only tolerable dose we have found- continue current medicine.   # GERD S:Taking Pantoprazole 40 mg BID and Reglan 5 mg BID.  Sx are well managed on medication.  Follows with Dr. Erlene Quan of [facility] A/P: sound stable- continue meds and GI follow up  # Vitamin B12 Deficiency  S:Receiving B12 injections monthly at our clinic  Lab Results  Component Value Date   VITAMINB12 167 (L) 12/13/2018   .  A/P: Hopefully improve-update B12 labs today  Other notes: 1.  Recently passed kidney stone 2.  Cough/congestion improved with Flonase-intermittent issues on Flonase helps 3.  Blood sugar slightly high on last labs-update A1c No results found for: HGBA1C   Recommended follow up:  6 months follow-up with me.  Medicare wellness visit in September with me or health coach Future Appointments  Date Time Provider [place]  06/26/2019  7:30 AM Hayden Pedro, MD [facility] None  07/13/2019 10:00 AM [facility] NURSE [facility] PEC  07/28/2019  9:40 AM Yong Channel, Brayton Mars, MD [facility]   Lab/Order associations:   ICD-10-CM   1. Hyperlipidemia, unspecified hyperlipidemia type  X52.8 Basic metabolic panel    LDL cholesterol, direct  2. Essential hypertension  I10   3. Coronary artery disease involving native coronary artery of  native heart without angina pectoris  I25.10   4. B12 deficiency  E53.8 Vitamin B12  5. Hyperglycemia  R73.9 Hemoglobin A1c   Return precautions advised.  Garret Reddish, MD

## 2019-06-19 NOTE — Patient Instructions (Addendum)
Health Maintenance Due  Topic Date Due  . TETANUS/TDAP - check with your pharmacy 10/09/2018  . INFLUENZA VACCINE - recommend a fall flu shot (we dont have this yet- we are hoping to do a drive through flu clinic) . Lets consider this September 11th at your wellness visit 06/17/2019   Please stop by lab before you go If you do not have mychart- we will call you about results within 5 business days of [place] receiving them.  If you have mychart- we will send your results within 3 business days of [place] receiving them.  If abnormal or we want to clarify a result, we will call or mychart you to make sure you receive the message.  If you have questions or concerns or don't hear within 5-7 days, please send [place] a message or call us.

## 2019-06-20 ENCOUNTER — Other Ambulatory Visit: Payer: Self-pay

## 2019-06-20 ENCOUNTER — Encounter: Payer: Self-pay | Admitting: Family Medicine

## 2019-06-20 ENCOUNTER — Ambulatory Visit (INDEPENDENT_AMBULATORY_CARE_PROVIDER_SITE_OTHER): Payer: Medicare Other | Admitting: Family Medicine

## 2019-06-20 VITALS — BP 130/72 | HR 62 | Temp 98.0°F | Ht 73.0 in | Wt 181.8 lb

## 2019-06-20 DIAGNOSIS — E785 Hyperlipidemia, unspecified: Secondary | ICD-10-CM

## 2019-06-20 DIAGNOSIS — E538 Deficiency of other specified B group vitamins: Secondary | ICD-10-CM

## 2019-06-20 DIAGNOSIS — R739 Hyperglycemia, unspecified: Secondary | ICD-10-CM

## 2019-06-20 DIAGNOSIS — I10 Essential (primary) hypertension: Secondary | ICD-10-CM

## 2019-06-20 DIAGNOSIS — I2510 Atherosclerotic heart disease of native coronary artery without angina pectoris: Secondary | ICD-10-CM

## 2019-06-20 LAB — BASIC METABOLIC PANEL
BUN: 11 mg/dL (ref 6–23)
CO2: 30 mEq/L (ref 19–32)
Calcium: 9.8 mg/dL (ref 8.4–10.5)
Chloride: 102 mEq/L (ref 96–112)
Creatinine, Ser: 0.75 mg/dL (ref 0.40–1.50)
GFR: 99.56 mL/min (ref >60.00)
Glucose, Bld: 103 mg/dL — ABNORMAL HIGH (ref 70–99)
Potassium: 4.1 mEq/L (ref 3.5–5.1)
Sodium: 139 mEq/L (ref 135–145)

## 2019-06-20 LAB — HEMOGLOBIN A1C: Hgb A1c MFr Bld: 5.7 % (ref 4.6–6.5)

## 2019-06-20 LAB — LDL CHOLESTEROL, DIRECT: Direct LDL: 87 mg/dL

## 2019-06-20 LAB — VITAMIN B12: Vitamin B-12: 474 pg/mL (ref 211–911)

## 2019-06-22 ENCOUNTER — Ambulatory Visit: Payer: Medicare Other

## 2019-06-26 ENCOUNTER — Encounter (INDEPENDENT_AMBULATORY_CARE_PROVIDER_SITE_OTHER): Payer: Medicare Other | Admitting: Ophthalmology

## 2019-06-26 ENCOUNTER — Other Ambulatory Visit: Payer: Self-pay

## 2019-06-26 DIAGNOSIS — I10 Essential (primary) hypertension: Secondary | ICD-10-CM

## 2019-06-26 DIAGNOSIS — H43813 Vitreous degeneration, bilateral: Secondary | ICD-10-CM

## 2019-06-26 DIAGNOSIS — H33301 Unspecified retinal break, right eye: Secondary | ICD-10-CM

## 2019-06-26 DIAGNOSIS — H35033 Hypertensive retinopathy, bilateral: Secondary | ICD-10-CM

## 2019-06-26 DIAGNOSIS — H353231 Exudative age-related macular degeneration, bilateral, with active choroidal neovascularization: Secondary | ICD-10-CM

## 2019-06-26 DIAGNOSIS — D3132 Benign neoplasm of left choroid: Secondary | ICD-10-CM

## 2019-06-27 DIAGNOSIS — H16122 Filamentary keratitis, left eye: Secondary | ICD-10-CM | POA: Diagnosis not present

## 2019-06-27 DIAGNOSIS — H04123 Dry eye syndrome of bilateral lacrimal glands: Secondary | ICD-10-CM | POA: Diagnosis not present

## 2019-07-03 ENCOUNTER — Other Ambulatory Visit: Payer: Self-pay | Admitting: Family Medicine

## 2019-07-06 ENCOUNTER — Other Ambulatory Visit: Payer: Self-pay | Admitting: Family Medicine

## 2019-07-06 MED ORDER — LOSARTAN POTASSIUM 100 MG PO TABS: ORAL_TABLET | ORAL | 1 refills | Status: DC

## 2019-07-06 NOTE — Telephone Encounter (Signed)
Copied from [place] [phone]. Topic: Quick Communication - Rx Refill/Question >> Jul 06, 2019 11:07 AM Erick Blinks wrote: Medication: losartan (COZAAR) 100 MG tablet - Pt has two left. Please advise   Has the patient contacted their pharmacy? Yes.   (Agent: If no, request that the patient contact the pharmacy for the refill.) (Agent: If yes, when and what did the pharmacy advise?)  Preferred Pharmacy (with phone number or street name): [facility] [address] [place] [address] [place]le[place] [address] Phone: [phone] Fax: [phone]    Agent: Please be advised that RX refills may take up to 3 business days. We ask that you follow-up with your pharmacy.

## 2019-07-07 DIAGNOSIS — M1612 Unilateral primary osteoarthritis, left hip: Secondary | ICD-10-CM | POA: Diagnosis not present

## 2019-07-07 DIAGNOSIS — M545 Low back pain: Secondary | ICD-10-CM | POA: Diagnosis not present

## 2019-07-13 ENCOUNTER — Ambulatory Visit (INDEPENDENT_AMBULATORY_CARE_PROVIDER_SITE_OTHER): Payer: Medicare Other

## 2019-07-13 ENCOUNTER — Other Ambulatory Visit: Payer: Self-pay

## 2019-07-13 DIAGNOSIS — E538 Deficiency of other specified B group vitamins: Secondary | ICD-10-CM

## 2019-07-13 MED ORDER — CYANOCOBALAMIN 1000 MCG/ML IJ SOLN
1000.0000 ug | Freq: Once | INTRAMUSCULAR | Status: AC
  Administered 2019-07-13: 1000 ug via INTRAMUSCULAR

## 2019-07-13 NOTE — Progress Notes (Signed)
Per orders of Dr. Yong Channel, injection of B12 given in right Deltoid by Franco Collet. Patient tolerated injection well.

## 2019-07-17 ENCOUNTER — Other Ambulatory Visit: Payer: Self-pay | Admitting: Cardiology

## 2019-07-21 DIAGNOSIS — M1612 Unilateral primary osteoarthritis, left hip: Secondary | ICD-10-CM | POA: Diagnosis not present

## 2019-07-21 DIAGNOSIS — M25552 Pain in left hip: Secondary | ICD-10-CM | POA: Diagnosis not present

## 2019-07-28 ENCOUNTER — Ambulatory Visit (INDEPENDENT_AMBULATORY_CARE_PROVIDER_SITE_OTHER): Payer: Medicare Other

## 2019-07-28 ENCOUNTER — Other Ambulatory Visit: Payer: Self-pay

## 2019-07-28 VITALS — BP 128/72 | Temp 97.7°F | Ht 73.0 in | Wt 179.2 lb

## 2019-07-28 DIAGNOSIS — Z0000 Encounter for general adult medical examination without abnormal findings: Secondary | ICD-10-CM

## 2019-07-28 DIAGNOSIS — Z23 Encounter for immunization: Secondary | ICD-10-CM

## 2019-07-28 NOTE — Patient Instructions (Signed)
[name] , Thank you for taking time to come for your Medicare Wellness Visit. I appreciate your ongoing commitment to your health goals. Please review the following plan we discussed and let me know if I can assist you in the future.   Screening recommendations/referrals: Colorectal Screening: completed 12/19/13  Vision and Dental Exams: Recommended annual ophthalmology exams for early detection of glaucoma and other disorders of the eye Recommended annual dental exams for proper oral hygiene  Vaccinations: Influenza vaccine: today  Pneumococcal vaccine: up to date; 11/23/14  Tdap vaccine: up to date; last 07/07/19 Shingles vaccine: completed   Advanced directives: Please bring a copy of your POA (Power of [place]) and/or Living Will to your next appointment.  Goals:Recommend to remove any items from the home that may cause slips or trips.  Next appointment: Please schedule your Annual Wellness Visit with your Nurse Health Advisor in one year.  Preventive Care 48 Years and Older, Male Preventive care refers to lifestyle choices and visits with your health care provider that can promote health and wellness. What does preventive care include?  A yearly physical exam. This is also called an annual well check.  Dental exams once or twice a year.  Routine eye exams. Ask your health care provider how often you should have your eyes checked.  Personal lifestyle choices, including:  Daily care of your teeth and gums.  Regular physical activity.  Eating a healthy diet.  Avoiding tobacco and drug use.  Limiting alcohol use.  Practicing safe sex.  Taking low doses of aspirin every day if recommended by your health care provider..  Taking vitamin and mineral supplements as recommended by your health care provider. What happens during an annual well check? The services and screenings done by your health care provider during your annual well check will depend on your age, overall  health, lifestyle risk factors, and family history of disease. Counseling  Your health care provider may ask you questions about your:  Alcohol use.  Tobacco use.  Drug use.  Emotional well-being.  Home and relationship well-being.  Sexual activity.  Eating habits.  History of falls.  Memory and ability to understand (cognition).  Work and work Statistician. Screening  You may have the following tests or measurements:  Height, weight, and BMI.  Blood pressure.  Lipid and cholesterol levels. These may be checked every 5 years, or more frequently if you are over 77 years old.  Skin check.  Lung cancer screening. You may have this screening every year starting at age 100 if you have a 30-pack-year history of smoking and currently smoke or have quit within the past 15 years.  Fecal occult blood test (FOBT) of the stool. You may have this test every year starting at age 109.  Flexible sigmoidoscopy or colonoscopy. You may have a sigmoidoscopy every 5 years or a colonoscopy every 10 years starting at age 57.  Prostate cancer screening. Recommendations will vary depending on your family history and other risks.  Hepatitis C blood test.  Hepatitis B blood test.  Sexually transmitted disease (STD) testing.  Diabetes screening. This is done by checking your blood sugar (glucose) after you have not eaten for a while (fasting). You may have this done every 1-3 years.  Abdominal aortic aneurysm (AAA) screening. You may need this if you are a current or former smoker.  Osteoporosis. You may be screened starting at age 40 if you are at high risk. Talk with your health care provider about your  test results, treatment options, and if necessary, the need for more tests. Vaccines  Your health care provider may recommend certain vaccines, such as:  Influenza vaccine. This is recommended every year.  Tetanus, diphtheria, and acellular pertussis (Tdap, Td) vaccine. You may need a Td  booster every 10 years.  Zoster vaccine. You may need this after age 59.  Pneumococcal 13-valent conjugate (PCV13) vaccine. One dose is recommended after age 35.  Pneumococcal polysaccharide (PPSV23) vaccine. One dose is recommended after age 59. Talk to your health care provider about which screenings and vaccines you need and how often you need them. This information is not intended to replace advice given to you by your health care provider. Make sure you discuss any questions you have with your health care provider. Document Released: 11/29/2015 Document Revised: 07/22/2016 Document Reviewed: 09/03/2015 Elsevier Interactive Patient Education  2017 [employer] Prevention in the Home Falls can cause injuries. They can happen to people of all ages. There are many things you can do to make your home safe and to help prevent falls. What can I do on the outside of my home?  Regularly fix the edges of walkways and driveways and fix any cracks.  Remove anything that might make you trip as you walk through a door, such as a raised step or threshold.  Trim any bushes or trees on the path to your home.  Use bright outdoor lighting.  Clear any walking paths of anything that might make someone trip, such as rocks or tools.  Regularly check to see if handrails are loose or broken. Make sure that both sides of any steps have handrails.  Any raised decks and porches should have guardrails on the edges.  Have any leaves, snow, or ice cleared regularly.  Use sand or salt on walking paths during winter.  Clean up any spills in your garage right away. This includes oil or grease spills. What can I do in the bathroom?  Use night lights.  Install grab bars by the toilet and in the tub and shower. Do not use towel bars as grab bars.  Use non-skid mats or decals in the tub or shower.  If you need to sit down in the shower, use a plastic, non-slip stool.  Keep the floor dry. Clean up  any water that spills on the floor as soon as it happens.  Remove soap buildup in the tub or shower regularly.  Attach bath mats securely with double-sided non-slip rug tape.  Do not have throw rugs and other things on the floor that can make you trip. What can I do in the bedroom?  Use night lights.  Make sure that you have a light by your bed that is easy to reach.  Do not use any sheets or blankets that are too big for your bed. They should not hang down onto the floor.  Have a firm chair that has side arms. You can use this for support while you get dressed.  Do not have throw rugs and other things on the floor that can make you trip. What can I do in the kitchen?  Clean up any spills right away.  Avoid walking on wet floors.  Keep items that you use a lot in easy-to-reach places.  If you need to reach something above you, use a strong step stool that has a grab bar.  Keep electrical cords out of the way.  Do not use floor polish or wax that  makes floors slippery. If you must use wax, use non-skid floor wax.  Do not have throw rugs and other things on the floor that can make you trip. What can I do with my stairs?  Do not leave any items on the stairs.  Make sure that there are handrails on both sides of the stairs and use them. Fix handrails that are broken or loose. Make sure that handrails are as long as the stairways.  Check any carpeting to make sure that it is firmly attached to the stairs. Fix any carpet that is loose or worn.  Avoid having throw rugs at the top or bottom of the stairs. If you do have throw rugs, attach them to the floor with carpet tape.  Make sure that you have a light switch at the top of the stairs and the bottom of the stairs. If you do not have them, ask someone to add them for you. What else can I do to help prevent falls?  Wear shoes that:  Do not have high heels.  Have rubber bottoms.  Are comfortable and fit you well.  Are  closed at the toe. Do not wear sandals.  If you use a stepladder:  Make sure that it is fully opened. Do not climb a closed stepladder.  Make sure that both sides of the stepladder are locked into place.  Ask someone to hold it for you, if possible.  Clearly mark and make sure that you can see:  Any grab bars or handrails.  First and last steps.  Where the edge of each step is.  Use tools that help you move around (mobility aids) if they are needed. These include:  Canes.  Walkers.  Scooters.  Crutches.  Turn on the lights when you go into a dark area. Replace any light bulbs as soon as they burn out.  Set up your furniture so you have a clear path. Avoid moving your furniture around.  If any of your floors are uneven, fix them.  If there are any pets around you, be aware of where they are.  Review your medicines with your doctor. Some medicines can make you feel dizzy. This can increase your chance of falling. Ask your doctor what other things that you can do to help prevent falls. This information is not intended to replace advice given to you by your health care provider. Make sure you discuss any questions you have with your health care provider. Document Released: 08/29/2009 Document Revised: 04/09/2016 Document Reviewed: 12/07/2014 Elsevier Interactive Patient Education  2017 [employer].

## 2019-07-28 NOTE — Progress Notes (Signed)
Subjective:   Joseph Simpson is a 84 y.o. male who presents for Medicare Annual/Subsequent preventive examination.  Review of Systems:   Cardiac Risk Factors include: advanced age (>35men, >24 women);hypertension;male gender     Objective:    Vitals: BP 128/72 (BP Location: Right Arm, Patient Position: Sitting, Cuff Size: Normal)   Temp 97.7 F (36.5 C) (Temporal)   Ht 6\' 1"  (1.854 m)   Wt 179 lb 3.2 oz (81.3 kg)   BMI 23.64 kg/m   Body mass index is 23.64 kg/m.  Advanced Directives 07/28/2019 06/16/2018 12/30/2017 12/15/2017 06/02/2017 07/20/2016 07/03/2016  Does Patient Have a Medical Advance Directive? Yes Yes Yes Yes Yes - Yes  Type of Advance Directive [place];Living will - Living will [place];Living will [place];Living will Living will Living will  Does patient want to make changes to medical advance directive? No - Patient declined - No - Patient declined - No - Patient declined No - Patient declined No - Patient declined  Copy of [place] in Chart? No - copy requested - - No - copy requested No - copy requested - No - copy requested  Would patient like information on creating a medical advance directive? - - - - - - -  Pre-existing out of facility DNR order (yellow form or pink MOST form) - - - - - - -    Tobacco Social History   Tobacco Use  Smoking Status Former Smoker  . Packs/day: 0.10  . Years: 4.00  . Pack years: 0.40  . Types: Cigarettes  . Quit date: 09/03/1956  . Years since quitting: 62.9  Smokeless Tobacco Never Used     Counseling given: Not Answered   Clinical Intake:  Pre-visit preparation completed: Yes  Pain : 0-10 Pain Score: 2  Pain Type: Acute pain Pain Location: Back Pain Descriptors / Indicators: Aching Pain Onset: In the past 7 days Pain Relieving Factors: hx of kidney stones  Pain Relieving Factors: hx of kidney stones  Diabetes: No  How often do  you need to have someone help you when you read instructions, pamphlets, or other written materials from your doctor or pharmacy?: 1 - Never  Interpreter Needed?: No  Information entered by :: Denman George LPN  Past Medical History:  Diagnosis Date  . Abnormal stress test December 2013   s/p cardiac cath with minimal nonobstructive CAD and normal LV function; medical management recommended  . Arthritis   . CAD (coronary artery disease)   . Dyslipidemia   . Dysrhythmia   . GERD (gastroesophageal reflux disease)   . History of hiatal hernia   . History of kidney stones   . Hypertension   . Macular degeneration   . Melanoma ([place])   . Obstructive sleep apnea 09/23/2007   Wore cpap previously. Stopped and not interested despite risks.     . OSA (obstructive sleep apnea)   . PAC (premature atrial contraction) 10/25/2015  . PVC's (premature ventricular contractions) 09/27/2013  . SVT (supraventricular tachycardia) ([place])   . Syncope    Past Surgical History:  Procedure Laterality Date  . APPENDECTOMY    . CARDIAC CATHETERIZATION  December 2013   minimal nonobstructive CAD with normal LV function  . CATARACT EXTRACTION    . CYSTOSCOPY WITH LITHOLAPAXY N/A 07/13/2016   Procedure: CYSTOSCOPY WITH LITHOLAPAXY;  Surgeon: Franchot Gallo, MD;  Location: [facility];  Service: Urology;  Laterality: N/A;  . CYSTOSCOPY/RETROGRADE/URETEROSCOPY/STONE EXTRACTION  WITH BASKET  02/24/2012   Procedure: CYSTOSCOPY/RETROGRADE/URETEROSCOPY/STONE EXTRACTION WITH BASKET;  Surgeon: Franchot Gallo, MD;  [facility];  Service: Urology;  Laterality: Bilateral;  1 HOUR  . GASTRECTOMY    . HEMORRHOID SURGERY    . HERNIA REPAIR  07/13/2016  . INGUINAL HERNIA REPAIR Left 07/13/2016   Procedure: REPAIR LEFT INGUINAL HERNIA;  Surgeon: Armandina Gemma, MD;  Location: [facility];  Service: General;  Laterality: Left;  . INSERTION OF MESH Left 07/13/2016   Procedure: INSERTION OF MESH;  Surgeon: Armandina Gemma, MD;  Location: [facility];  Service: General;  Laterality: Left;  Marland Kitchen MELANOMA EXCISION  05-05-2007   LEFT FOREARM  . NOSE SURGERY    . TONSILLECTOMY    . TOTAL SHOULDER ARTHROPLASTY Right 12/29/2017  . TOTAL SHOULDER ARTHROPLASTY Right 12/29/2017   Procedure: TOTAL SHOULDER ARTHROPLASTY;  Surgeon: Hiram Gash, MD;  Location: [place];  Service: Orthopedics;  Laterality: Right;   Family History  Problem Relation Age of Onset  . Heart disease Mother        CABG in her 54s  . Pneumonia Father   . Cancer Sister        breast  . Lung cancer Brother        smoker   Social History   Socioeconomic History  . Marital status: Widowed    Spouse name: Not on file  . Number of children: 1  . Years of education: Not on file  . Highest education level: Not on file  Occupational History  . Not on file  Social Needs  . Financial resource strain: Not on file  . Food insecurity    Worry: Not on file    Inability: Not on file  . Transportation needs    Medical: Not on file    Non-medical: Not on file  Tobacco Use  . Smoking status: Former Smoker    Packs/day: 0.10    Years: 4.00    Pack years: 0.40    Types: Cigarettes    Quit date: 09/03/1956    Years since quitting: 62.9  . Smokeless tobacco: Never Used  Substance and Sexual Activity  . Alcohol use: No    Alcohol/week: 0.0 standard drinks  . Drug use: No  . Sexual activity: Yes  Lifestyle  . Physical activity    Days per week: Not on file    Minutes per session: Not on file  . Stress: Not on file  Relationships  . Social Herbalist on phone: Not on file    Gets together: Not on file    Attends religious service: Not on file    Active member of club or organization: Not on file    Attends meetings of clubs or organizations: Not on file    Relationship status: Not on file  Other Topics Concern  . Not on file  Social History Narrative   Widowed (lost wife 2015 from lung cancer never smoker), 1 son, 2  grandkids, 1 greatgrandkid (born on Christmas)      Retired from [employer] for 40 years. 3 million miles.       Hobbies: ride motorcycle (slingshot 3 year), rabbit and dear hunt    Outpatient Encounter Medications as of 07/28/2019  Medication Sig  . albuterol (PROAIR HFA) 108 (90 Base) MCG/ACT inhaler Inhale 2 puffs into the lungs every 6 (six) hours as needed for wheezing or shortness of breath.  Marland Kitchen atorvastatin (LIPITOR) 40 MG tablet Take  1 tablet, po, once a week  . bevacizumab (AVASTIN) 2.5 mg/0.1 mL SOLN 2.5 mg by Intravitreal route. Injection to eye every 8 weeks - use with vigamox  . fluticasone (FLONASE) 50 MCG/ACT nasal spray Place 2 sprays into both nostrils daily. (Patient taking differently: Place 2 sprays into both nostrils as needed for allergies. )  . losartan (COZAAR) 100 MG tablet TAKE HALF TABLET BY MOUTH TWICE DAILY  . metoCLOPramide (REGLAN) 10 MG tablet Take 5 mg by mouth 2 (two) times daily.  . metoprolol tartrate (LOPRESSOR) 25 MG tablet TAKE ONE TABLET BY MOUTH TWICE DAILY   . pantoprazole (PROTONIX) 40 MG tablet Take 40 mg by mouth 2 (two) times daily.   [name] Glycol-Propyl Glycol (SYSTANE ULTRA) 0.4-0.3 % SOLN Place 1 drop into both eyes 3 (three) times daily.   Marland Kitchen VIGAMOX 0.5 % ophthalmic solution Apply 1 drop to eye as directed. Take every 8 weeks as directed - use with avastin   No facility-administered encounter medications on file as of 07/28/2019.     Activities of Daily Living In your present state of health, do you have any difficulty performing the following activities: 07/28/2019  Hearing? N  Vision? N  Difficulty concentrating or making decisions? N  Walking or climbing stairs? Y  Comment at times due to hip pain  Dressing or bathing? N  Doing errands, shopping? N  Preparing Food and eating ? N  Using the Toilet? N  In the past six months, have you accidently leaked urine? N  Do you have problems with loss of bowel control? N  Managing your  Medications? N  Managing your Finances? N  Housekeeping or managing your Housekeeping? N  Some recent data might be hidden    Patient Care Team: Marin Olp, MD as PCP - General (Family Medicine) [place], Peter M, MD as PCP - Cardiology (Cardiology) [place], Peter M, MD as Consulting Physician (Cardiology) Almedia Balls, MD as Referring Physician (Orthopedic Surgery) Breck Coons, MD as Consulting Physician (Internal Medicine)   Assessment:   This is a routine wellness examination for Xin.  Exercise Activities and Dietary recommendations Current Exercise Habits: The patient does not participate in regular exercise at present  Goals    . Maintain current health status.     . Patient Stated     Maintain your health !  Travel when you can        Fall Risk Fall Risk  06/20/2019 03/15/2019 12/13/2018 06/16/2018 06/09/2018  Falls in the past year? 0 0 0 No No  Number falls in past yr: 0 0 0 - -  Injury with Fall? 0 0 0 - -   Is the patient's home free of loose throw rugs in walkways, pet beds, electrical cords, etc?   yes      Grab bars in the bathroom? yes      Handrails on the stairs?   yes      Adequate lighting?   yes  Timed Get Up and Go Performed: completed and within normal timeframe; no gait abnormalities noted    Depression Screen PHQ 2/9 Scores 06/20/2019 06/16/2018 06/09/2018 06/02/2017  PHQ - 2 Score 0 0 0 0    Cognitive Function- no cognitive concerns at this time  MMSE - Mini Mental State Exam 06/16/2018  Not completed: (No Data)     6CIT Screen 07/28/2019  What Year? 0 points  What month? 0 points  What time? 0 points  Count back from 20  0 points  Months in reverse 0 points  Repeat phrase 0 points  Total Score 0    Immunization History  Administered Date(s) Administered  . Influenza Split 09/04/2011, 08/19/2012  . Influenza Whole 08/31/2007, 09/16/2008, 10/07/2009, 10/01/2010  . Influenza, High Dose Seasonal PF 08/25/2013, 09/24/2015, 08/27/2016,  09/01/2017, 08/23/2018  . Influenza,inj,Quad PF,6+ Mos 09/05/2014  . Pneumococcal Conjugate-13 11/23/2014  . Pneumococcal Polysaccharide-23 10/16/2002  . Td 03/04/1998, 10/09/2008  . Tdap 07/07/2019  . Zoster 04/28/2011  . Zoster Recombinat (Shingrix) 05/17/2018, 07/19/2018    Qualifies for Shingles Vaccine? Completed   Screening Tests Health Maintenance  Topic Date Due  . INFLUENZA VACCINE  06/17/2019  . TETANUS/TDAP  07/06/2029  . PNA vac Low Risk Adult  Completed   Cancer Screenings: Lung: Low Dose CT Chest recommended if Age 34-80 years, 30 pack-year currently smoking OR have quit w/in 15years. Patient does not qualify. Colorectal: completed 12/19/13 with Dr. Maura Crandall       Plan:    I have personally reviewed and addressed the Medicare Annual Wellness questionnaire and have noted the following in the patient's chart:  A. Medical and social history B. Use of alcohol, tobacco or illicit drugs  C. Current medications and supplements D. Functional ability and status E.  Nutritional status F.  Physical activity G. Advance directives H. List of other physicians I.  Hospitalizations, surgeries, and ER visits in previous 12 months J.  [place] such as hearing and vision if needed, cognitive and depression L. Referrals, records requested, and appointments- none   In addition, I have reviewed and discussed with patient certain preventive protocols, quality metrics, and best practice recommendations. A written personalized care plan for preventive services as well as general preventive health recommendations were provided to patient.   Signed,  Denman George, LPN  Nurse Health Advisor   Nurse Notes: no additional

## 2019-07-28 NOTE — Progress Notes (Signed)
I have reviewed and agree with note, evaluation, plan.   Stephen Hunter, MD  

## 2019-07-31 ENCOUNTER — Encounter (INDEPENDENT_AMBULATORY_CARE_PROVIDER_SITE_OTHER): Payer: Medicare Other | Admitting: Ophthalmology

## 2019-07-31 ENCOUNTER — Other Ambulatory Visit: Payer: Self-pay

## 2019-07-31 DIAGNOSIS — H35033 Hypertensive retinopathy, bilateral: Secondary | ICD-10-CM | POA: Diagnosis not present

## 2019-07-31 DIAGNOSIS — H353231 Exudative age-related macular degeneration, bilateral, with active choroidal neovascularization: Secondary | ICD-10-CM | POA: Diagnosis not present

## 2019-07-31 DIAGNOSIS — I10 Essential (primary) hypertension: Secondary | ICD-10-CM | POA: Diagnosis not present

## 2019-07-31 DIAGNOSIS — D3132 Benign neoplasm of left choroid: Secondary | ICD-10-CM

## 2019-07-31 DIAGNOSIS — H43813 Vitreous degeneration, bilateral: Secondary | ICD-10-CM

## 2019-07-31 DIAGNOSIS — H33301 Unspecified retinal break, right eye: Secondary | ICD-10-CM | POA: Diagnosis not present

## 2019-08-04 DIAGNOSIS — M1612 Unilateral primary osteoarthritis, left hip: Secondary | ICD-10-CM | POA: Diagnosis not present

## 2019-08-07 ENCOUNTER — Other Ambulatory Visit: Payer: Self-pay | Admitting: Family Medicine

## 2019-08-08 DIAGNOSIS — L570 Actinic keratosis: Secondary | ICD-10-CM | POA: Diagnosis not present

## 2019-08-08 DIAGNOSIS — Z85828 Personal history of other malignant neoplasm of skin: Secondary | ICD-10-CM | POA: Diagnosis not present

## 2019-08-08 DIAGNOSIS — D0462 Carcinoma in situ of skin of left upper limb, including shoulder: Secondary | ICD-10-CM | POA: Diagnosis not present

## 2019-08-08 DIAGNOSIS — L814 Other melanin hyperpigmentation: Secondary | ICD-10-CM | POA: Diagnosis not present

## 2019-08-08 DIAGNOSIS — L821 Other seborrheic keratosis: Secondary | ICD-10-CM | POA: Diagnosis not present

## 2019-08-08 DIAGNOSIS — D2271 Melanocytic nevi of right lower limb, including hip: Secondary | ICD-10-CM | POA: Diagnosis not present

## 2019-08-08 DIAGNOSIS — L739 Follicular disorder, unspecified: Secondary | ICD-10-CM | POA: Diagnosis not present

## 2019-08-08 DIAGNOSIS — Z87898 Personal history of other specified conditions: Secondary | ICD-10-CM | POA: Diagnosis not present

## 2019-08-08 DIAGNOSIS — D2230 Melanocytic nevi of unspecified part of face: Secondary | ICD-10-CM | POA: Diagnosis not present

## 2019-08-08 DIAGNOSIS — D485 Neoplasm of uncertain behavior of skin: Secondary | ICD-10-CM | POA: Diagnosis not present

## 2019-08-08 DIAGNOSIS — Z23 Encounter for immunization: Secondary | ICD-10-CM | POA: Diagnosis not present

## 2019-08-15 ENCOUNTER — Other Ambulatory Visit: Payer: Self-pay

## 2019-08-15 ENCOUNTER — Ambulatory Visit (INDEPENDENT_AMBULATORY_CARE_PROVIDER_SITE_OTHER): Payer: Medicare Other

## 2019-08-15 DIAGNOSIS — E538 Deficiency of other specified B group vitamins: Secondary | ICD-10-CM | POA: Diagnosis not present

## 2019-08-15 MED ORDER — CYANOCOBALAMIN 1000 MCG/ML IJ SOLN
1000.0000 ug | Freq: Once | INTRAMUSCULAR | Status: AC
  Administered 2019-08-15: 1000 ug via INTRAMUSCULAR

## 2019-08-15 NOTE — Progress Notes (Signed)
Per orders of Dr. Hunter, injection of Vitamin b12, 1000 mcg given left deltoid IM by Jennifer M Zellmer, CMA Patient tolerated injection well.  He will return in 1 month for his next injection  

## 2019-08-15 NOTE — Progress Notes (Signed)
I have reviewed and agree with note, evaluation, plan.   Deisy Ozbun, MD  

## 2019-08-17 ENCOUNTER — Other Ambulatory Visit: Payer: Self-pay | Admitting: Family Medicine

## 2019-09-04 ENCOUNTER — Encounter (INDEPENDENT_AMBULATORY_CARE_PROVIDER_SITE_OTHER): Payer: Medicare Other | Admitting: Ophthalmology

## 2019-09-04 ENCOUNTER — Other Ambulatory Visit: Payer: Self-pay

## 2019-09-04 DIAGNOSIS — I10 Essential (primary) hypertension: Secondary | ICD-10-CM

## 2019-09-04 DIAGNOSIS — H33301 Unspecified retinal break, right eye: Secondary | ICD-10-CM | POA: Diagnosis not present

## 2019-09-04 DIAGNOSIS — H35033 Hypertensive retinopathy, bilateral: Secondary | ICD-10-CM

## 2019-09-04 DIAGNOSIS — H353231 Exudative age-related macular degeneration, bilateral, with active choroidal neovascularization: Secondary | ICD-10-CM | POA: Diagnosis not present

## 2019-09-04 DIAGNOSIS — D3132 Benign neoplasm of left choroid: Secondary | ICD-10-CM | POA: Diagnosis not present

## 2019-09-06 ENCOUNTER — Other Ambulatory Visit: Payer: Self-pay | Admitting: Family Medicine

## 2019-09-14 ENCOUNTER — Other Ambulatory Visit: Payer: Self-pay

## 2019-09-14 ENCOUNTER — Telehealth: Payer: Self-pay | Admitting: *Deleted

## 2019-09-14 ENCOUNTER — Encounter: Payer: Self-pay | Admitting: *Deleted

## 2019-09-14 ENCOUNTER — Ambulatory Visit: Payer: Medicare Other

## 2019-09-14 ENCOUNTER — Ambulatory Visit (INDEPENDENT_AMBULATORY_CARE_PROVIDER_SITE_OTHER): Payer: Medicare Other | Admitting: *Deleted

## 2019-09-14 DIAGNOSIS — D0462 Carcinoma in situ of skin of left upper limb, including shoulder: Secondary | ICD-10-CM | POA: Diagnosis not present

## 2019-09-14 DIAGNOSIS — L570 Actinic keratosis: Secondary | ICD-10-CM | POA: Diagnosis not present

## 2019-09-14 DIAGNOSIS — E538 Deficiency of other specified B group vitamins: Secondary | ICD-10-CM

## 2019-09-14 DIAGNOSIS — Z23 Encounter for immunization: Secondary | ICD-10-CM | POA: Diagnosis not present

## 2019-09-14 MED ORDER — CYANOCOBALAMIN 1000 MCG/ML IJ SOLN
1000.0000 ug | Freq: Once | INTRAMUSCULAR | Status: AC
  Administered 2019-09-14: 1000 ug via INTRAMUSCULAR

## 2019-09-14 NOTE — Telephone Encounter (Signed)
I would have him remain off the amlodipine since blood pressure was controlled off the medication.  Have him monitor on other medications his blood pressure-have him update [place] in 2 weeks with home readings.  Goal less than 140/90  If he does not have a home cuff Omron series 3 is reasonably priced cuff

## 2019-09-14 NOTE — Telephone Encounter (Signed)
Dr. Yong Channel, pt came in today for his B12 injection and he said when he went to pick up his refills there was 3 ready instead of 2. Pt picked up Rx Amlodipine 2.5 mg but he says he has not been taking this for awhile. According to your last note I did not see it mentioned as one of his blood pressure medications. Do you want pt to take Amlodipine or not?

## 2019-09-14 NOTE — Progress Notes (Signed)
I have reviewed and agree with note, evaluation, plan.   Stephen Hunter, MD  

## 2019-09-14 NOTE — Progress Notes (Signed)
Per orders of Dr. Yong Channel, injection of Cyanocobalamin 1000 mcg IM given by Anselmo Pickler in right deltoid. Patient tolerated injection well. Patient will make appointment for 1 month

## 2019-09-14 NOTE — Telephone Encounter (Signed)
Dr. Yong Channel, pt said he was not on it at visit in August when he saw you. He was off it for awhile.  I am not sure why it got filled. Pt just wants to know if you want him to go back on it or not?

## 2019-09-14 NOTE — Telephone Encounter (Signed)
If patient has been taking the amlodipine consistently - go ahead and can refill . It may have simply fallen off med list and not gotten added back on. Could also discuss fill pattern with pharmacy- he may have very well been on at august visit

## 2019-09-15 NOTE — Telephone Encounter (Signed)
See note, please contact patient before the weekend if possible.

## 2019-09-15 NOTE — Telephone Encounter (Signed)
Left detailed message on mobile Dr. Yong Channel said to remain off the amlodipine since blood pressure was controlled off the medication at your last visit. He would like you to monitor blood pressure and update [place] in 2 weeks with home readings.  Goal less than 140/90. Any questions please call office.

## 2019-09-15 NOTE — Telephone Encounter (Signed)
Left message on voicemail to call office.  

## 2019-09-15 NOTE — Telephone Encounter (Signed)
Patient is calling back for Butch Penny. Please advise.

## 2019-09-18 NOTE — Telephone Encounter (Signed)
Spoke to pt told him just wanted to make sure he got the message I left on Friday. Pt said yes he did and will monitor blood pressure and call back in 2 weeks with readings.

## 2019-09-24 IMAGING — CT CT SHOULDER*R* W/O CM
2 series · 10 of 14 positions shown, 11 images · non-contrast
Comparison: None.

CLINICAL DATA: Right shoulder pain for several months. Preop
planning CT of the shoulder.

EXAM:
CT OF THE UPPER RIGHT EXTREMITY WITHOUT CONTRAST
TECHNIQUE: Multidetector CT imaging of the upper right extremity was performed
according to the standard protocol.

[Series 5: shoulder soft · axial · 0.53mm/px · z∈[-196,-63]mm · 4 of 89 slices shown, 5 images]
[im 18/89  soft-tissue]
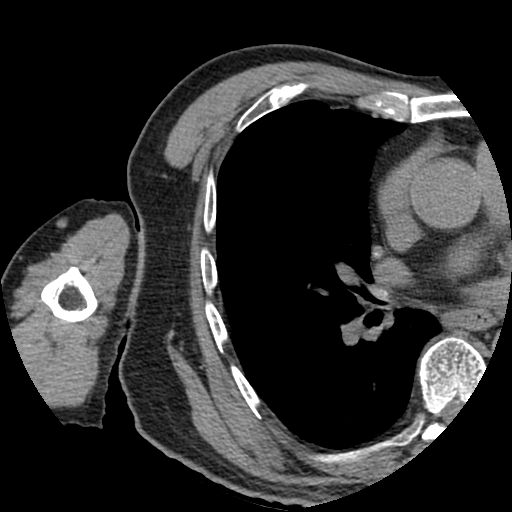
[im 18/89  bone]
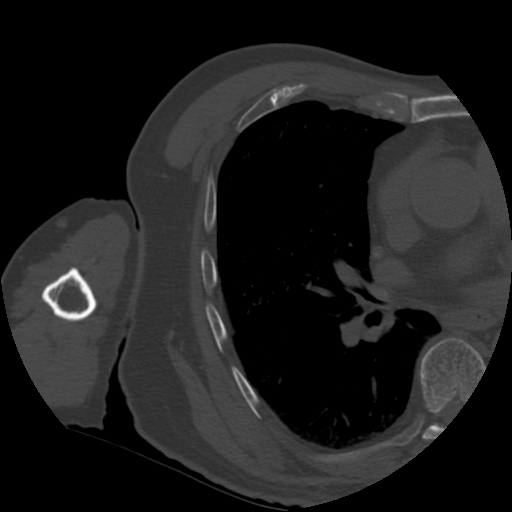
[im 36/89  bone]
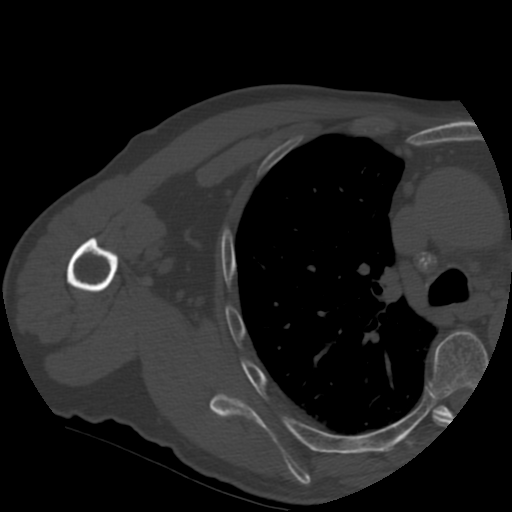
[im 53/89  bone]
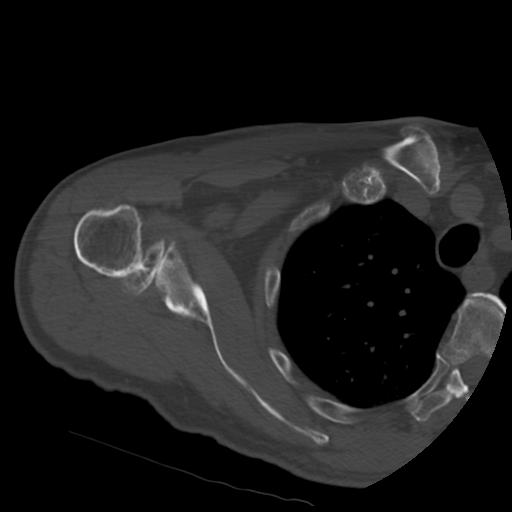
[im 71/89  bone]
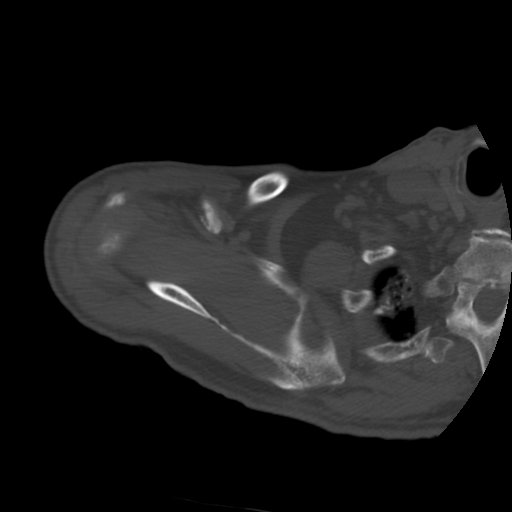

[Series 300: sag soft · sagittal · 0.44mm/px · 6 of 121 slices shown]
[im 18/121  soft-tissue]
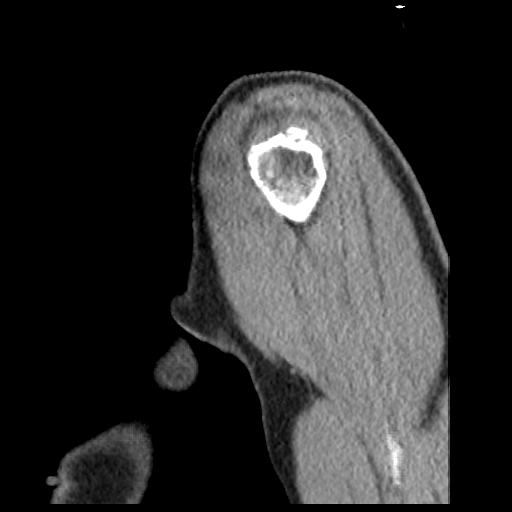
[im 35/121  soft-tissue]
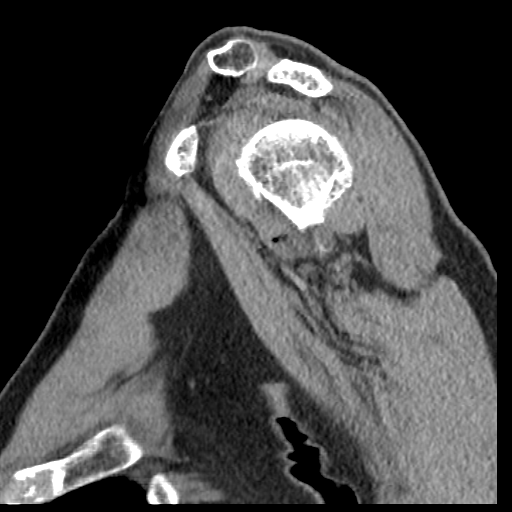
[im 52/121  soft-tissue]
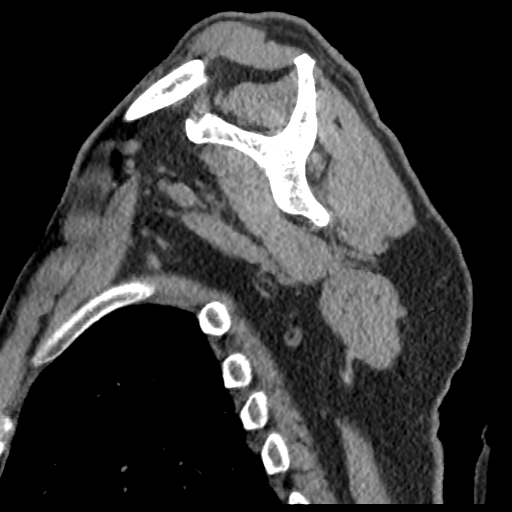
[im 69/121  soft-tissue]
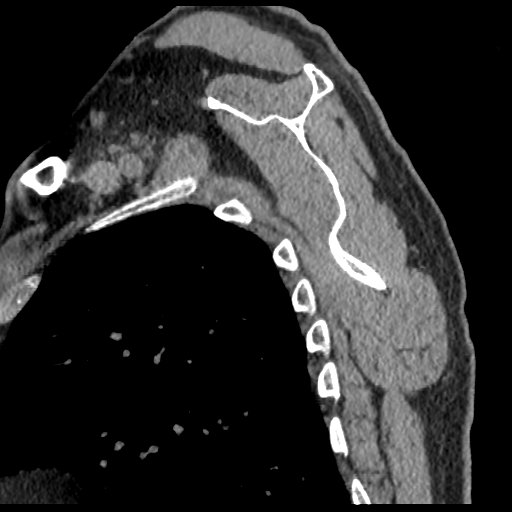
[im 86/121  soft-tissue]
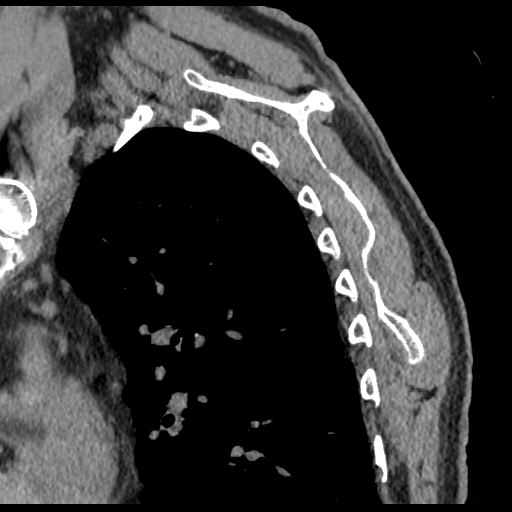
[im 103/121  soft-tissue]
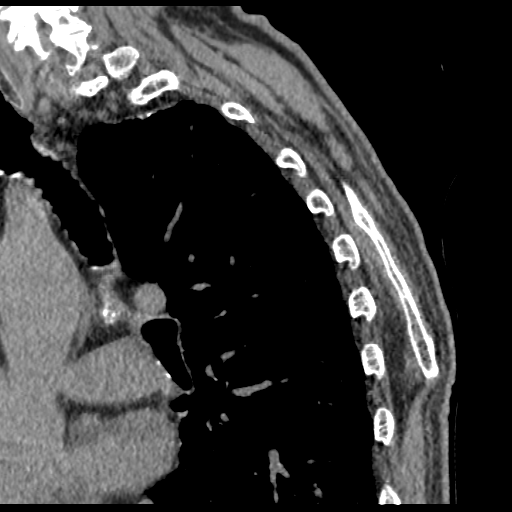

[10 of 14 positions shown; findings below may reference images not displayed]

FINDINGS: Bones/Joint/Cartilage

No fracture or dislocation. Normal alignment. No joint effusion.

Severe joint space narrowing of right glenohumeral joint with
marginal osteophytes and mild subchondral cystic changes.

Mild arthropathy of the acromioclavicular joint. Type I acromion.
Ossification along the coracoclavicular ligament likely related to
prior injury.

Degenerative disc disease with disc height loss and broad based disc
osteophyte complexes at C5-6 and C6-7 with right facet arthropathy.

Ligaments

Ligaments are suboptimally evaluated by CT.

Muscles and Tendons
Muscles are normal.  No muscle atrophy.

Soft tissue
No fluid collection or hematoma. No soft tissue mass. Visualize
right lung is clear. Ascending thoracic aorta measures 4.1 cm.
IMPRESSION: 1. Severe osteoarthritis of the right glenohumeral joint. No
significant loss of bone stock.
2. Ascending thoracic aortic aneurysm measuring 4.1 cm. Recommend
annual imaging followup by CTA or MRA. This recommendation follows
2939 ACCF/AHA/AATS/ACR/ASA/SCA/ALM/DONNER/WERIKUNE/NOMASIBULELE Guidelines for the
Diagnosis and Management of Patients with Thoracic Aortic Disease.
Circulation. 2939; 121: [id number]

## 2019-10-09 ENCOUNTER — Encounter (INDEPENDENT_AMBULATORY_CARE_PROVIDER_SITE_OTHER): Payer: Medicare Other | Admitting: Ophthalmology

## 2019-10-09 DIAGNOSIS — H35033 Hypertensive retinopathy, bilateral: Secondary | ICD-10-CM | POA: Diagnosis not present

## 2019-10-09 DIAGNOSIS — D3131 Benign neoplasm of right choroid: Secondary | ICD-10-CM | POA: Diagnosis not present

## 2019-10-09 DIAGNOSIS — I10 Essential (primary) hypertension: Secondary | ICD-10-CM

## 2019-10-09 DIAGNOSIS — H43813 Vitreous degeneration, bilateral: Secondary | ICD-10-CM | POA: Diagnosis not present

## 2019-10-09 DIAGNOSIS — H353231 Exudative age-related macular degeneration, bilateral, with active choroidal neovascularization: Secondary | ICD-10-CM

## 2019-10-10 ENCOUNTER — Telehealth: Payer: Self-pay

## 2019-10-10 NOTE — Telephone Encounter (Signed)
Copied from [place] [id number]. Topic: General - Inquiry >> Oct 10, 2019 11:37 AM Mathis Bud wrote: Reason for CRM: Patient is requesting a call back regarding his blood pressure.  Patient states he was suppose to record BP all week and let PCP nurse to know.  Call back [phone]

## 2019-10-11 NOTE — Telephone Encounter (Signed)
Called patient, no answer. Left a voicemail for patient to return call. 

## 2019-10-11 NOTE — Telephone Encounter (Signed)
Spoke with patient. For the 1 st week, his top numbers of his BP were reading between 114-120's. The bottom number was reading between 75s and around 68. The second week patient states it was running slightly higher between 134-144 for the top number and the bottom was reading between 60's and 78.

## 2019-10-11 NOTE — Telephone Encounter (Signed)
Pt is returning [place] call   CB: [phone]

## 2019-10-11 NOTE — Telephone Encounter (Signed)
See note

## 2019-10-12 NOTE — Telephone Encounter (Signed)
I suspect his average is less than 140/90 based off these readings-can you please have him give me a physical report of readings (either read out over phone or dropped off on piece of paper with name, dob, phone #) over the next 2 weeks-I will average those and if less than 140/90 will remain off the amlodipine which I believe is the main question he has-then we have follow-up in February we can check in person.

## 2019-10-16 NOTE — Telephone Encounter (Signed)
Patient given information. He will bring in some readings tomorrow when comes in for his B-12. He did not have any questions at this time.

## 2019-10-17 ENCOUNTER — Other Ambulatory Visit: Payer: Self-pay

## 2019-10-17 ENCOUNTER — Telehealth: Payer: Self-pay | Admitting: Family Medicine

## 2019-10-17 ENCOUNTER — Ambulatory Visit (INDEPENDENT_AMBULATORY_CARE_PROVIDER_SITE_OTHER): Payer: Medicare Other

## 2019-10-17 DIAGNOSIS — E538 Deficiency of other specified B group vitamins: Secondary | ICD-10-CM

## 2019-10-17 DIAGNOSIS — I10 Essential (primary) hypertension: Secondary | ICD-10-CM

## 2019-10-17 MED ORDER — CYANOCOBALAMIN 1000 MCG/ML IJ SOLN
1000.0000 ug | Freq: Once | INTRAMUSCULAR | Status: AC
  Administered 2019-10-17: 1000 ug via INTRAMUSCULAR

## 2019-10-17 NOTE — Telephone Encounter (Signed)
Patient dropped off blood pressure readings.  His average blood pressure over 14 readings is 127.4/63.4 without the amlodipine so lets remain off medication as goal is less than 140/90-I remove medication from his list.  Continue losartan 50 mg twice a day and metoprolol 25 mg twice a day  14 home readings 114 67  113 58  138 56  124 55  128 61  147 67  157 68  113 60  113 58  144 78  141 70  138 71  104  59  110 60

## 2019-10-17 NOTE — Progress Notes (Signed)
Per orders of Dr. Yong Channel, injection of B12 given by Serita Sheller in left deltoid. Patient tolerated injection well. Patient will make appointment for 1 month.

## 2019-10-17 NOTE — Assessment & Plan Note (Signed)
Patient dropped off blood pressure readings.  His average blood pressure over 14 readings is 127.4/63.4 without the amlodipine so lets remain off medication as goal is less than 140/90-I remove medication from his list.  Continue losartan 50 mg twice a day and metoprolol 25 mg twice a day  14 home readings 114 67  113 58  138 56  124 55  128 61  147 67  157 68  113 60  113 58  144 78  141 70  138 71  104  59  110 60

## 2019-10-18 NOTE — Telephone Encounter (Signed)
Called and notified pt, he voiced understanding.

## 2019-10-23 DIAGNOSIS — H00022 Hordeolum internum right lower eyelid: Secondary | ICD-10-CM | POA: Diagnosis not present

## 2019-11-06 ENCOUNTER — Encounter (INDEPENDENT_AMBULATORY_CARE_PROVIDER_SITE_OTHER): Payer: Medicare Other | Admitting: Ophthalmology

## 2019-11-06 DIAGNOSIS — I10 Essential (primary) hypertension: Secondary | ICD-10-CM | POA: Diagnosis not present

## 2019-11-06 DIAGNOSIS — H35033 Hypertensive retinopathy, bilateral: Secondary | ICD-10-CM

## 2019-11-06 DIAGNOSIS — H353231 Exudative age-related macular degeneration, bilateral, with active choroidal neovascularization: Secondary | ICD-10-CM | POA: Diagnosis not present

## 2019-11-06 DIAGNOSIS — H43813 Vitreous degeneration, bilateral: Secondary | ICD-10-CM

## 2019-11-06 DIAGNOSIS — D3132 Benign neoplasm of left choroid: Secondary | ICD-10-CM

## 2019-11-06 DIAGNOSIS — H33301 Unspecified retinal break, right eye: Secondary | ICD-10-CM

## 2019-11-15 ENCOUNTER — Other Ambulatory Visit: Payer: Self-pay

## 2019-11-16 ENCOUNTER — Ambulatory Visit (INDEPENDENT_AMBULATORY_CARE_PROVIDER_SITE_OTHER): Payer: Medicare Other

## 2019-11-16 DIAGNOSIS — E538 Deficiency of other specified B group vitamins: Secondary | ICD-10-CM

## 2019-11-16 MED ORDER — CYANOCOBALAMIN 1000 MCG/ML IJ SOLN
1000.0000 ug | Freq: Once | INTRAMUSCULAR | Status: AC
  Administered 2019-11-16: 1000 ug via INTRAMUSCULAR

## 2019-11-16 NOTE — Progress Notes (Signed)
Per orders of Joseph Simpson, [place], injection of B12 given by Francella Solian in right deltoid. Patient tolerated injection well. Patient will make appointment for 1 month. Patient tolerated injection well.

## 2019-12-04 ENCOUNTER — Encounter (INDEPENDENT_AMBULATORY_CARE_PROVIDER_SITE_OTHER): Payer: Medicare Other | Admitting: Ophthalmology

## 2019-12-04 DIAGNOSIS — H33301 Unspecified retinal break, right eye: Secondary | ICD-10-CM | POA: Diagnosis not present

## 2019-12-04 DIAGNOSIS — D3132 Benign neoplasm of left choroid: Secondary | ICD-10-CM

## 2019-12-04 DIAGNOSIS — I10 Essential (primary) hypertension: Secondary | ICD-10-CM

## 2019-12-04 DIAGNOSIS — H353231 Exudative age-related macular degeneration, bilateral, with active choroidal neovascularization: Secondary | ICD-10-CM

## 2019-12-04 DIAGNOSIS — H35033 Hypertensive retinopathy, bilateral: Secondary | ICD-10-CM | POA: Diagnosis not present

## 2019-12-04 DIAGNOSIS — H43813 Vitreous degeneration, bilateral: Secondary | ICD-10-CM | POA: Diagnosis not present

## 2019-12-14 ENCOUNTER — Ambulatory Visit (INDEPENDENT_AMBULATORY_CARE_PROVIDER_SITE_OTHER): Payer: Medicare Other

## 2019-12-14 ENCOUNTER — Other Ambulatory Visit: Payer: Self-pay

## 2019-12-14 DIAGNOSIS — E538 Deficiency of other specified B group vitamins: Secondary | ICD-10-CM

## 2019-12-14 MED ORDER — CYANOCOBALAMIN 1000 MCG/ML IJ SOLN
1000.0000 ug | Freq: Once | INTRAMUSCULAR | Status: AC
  Administered 2019-12-14: 1000 ug via INTRAMUSCULAR

## 2019-12-14 NOTE — Progress Notes (Signed)
I have reviewed and agree with note, evaluation, plan.   Elma Limas, MD  

## 2019-12-14 NOTE — Progress Notes (Signed)
Per orders of Dr. Yong Channel, injection of Vitamin b12, 1000 mcg given left deltoid IM by Kevan Ny, CMA Patient tolerated injection well. Patient will return in 1 month for his next injection.

## 2019-12-21 ENCOUNTER — Other Ambulatory Visit: Payer: Self-pay

## 2019-12-21 ENCOUNTER — Encounter: Payer: Self-pay | Admitting: Family Medicine

## 2019-12-21 ENCOUNTER — Ambulatory Visit (INDEPENDENT_AMBULATORY_CARE_PROVIDER_SITE_OTHER): Payer: Medicare Other | Admitting: Family Medicine

## 2019-12-21 VITALS — BP 120/84 | HR 75 | Temp 98.1°F | Ht 73.0 in | Wt 185.4 lb

## 2019-12-21 DIAGNOSIS — E785 Hyperlipidemia, unspecified: Secondary | ICD-10-CM | POA: Diagnosis not present

## 2019-12-21 DIAGNOSIS — R739 Hyperglycemia, unspecified: Secondary | ICD-10-CM | POA: Diagnosis not present

## 2019-12-21 DIAGNOSIS — I10 Essential (primary) hypertension: Secondary | ICD-10-CM

## 2019-12-21 DIAGNOSIS — I2510 Atherosclerotic heart disease of native coronary artery without angina pectoris: Secondary | ICD-10-CM

## 2019-12-21 LAB — CBC WITH DIFFERENTIAL/PLATELET
Basophils Absolute: 0 10*3/uL (ref 0.0–0.1)
Basophils Relative: 0.5 % (ref 0.0–3.0)
Eosinophils Absolute: 0.2 10*3/uL (ref 0.0–0.7)
Eosinophils Relative: 3.2 % (ref 0.0–5.0)
HCT: 48.8 % (ref 39.0–52.0)
Hemoglobin: 16.6 g/dL (ref 13.0–17.0)
Lymphocytes Relative: 22.5 % (ref 12.0–46.0)
Lymphs Abs: 1.3 10*3/uL (ref 0.7–4.0)
MCHC: 34 g/dL (ref 30.0–36.0)
MCV: 95.8 fl (ref 78.0–100.0)
Monocytes Absolute: 0.5 10*3/uL (ref 0.1–1.0)
Monocytes Relative: 9.6 % (ref 3.0–12.0)
Neutro Abs: 3.6 10*3/uL (ref 1.4–7.7)
Neutrophils Relative %: 64.2 % (ref 43.0–77.0)
Platelets: 158 10*3/uL (ref 150.0–400.0)
RBC: 5.09 Mil/uL (ref 4.22–5.81)
RDW: 13.2 % (ref 11.5–15.5)
WBC: 5.6 10*3/uL (ref 4.0–10.5)

## 2019-12-21 LAB — COMPREHENSIVE METABOLIC PANEL
ALT: 31 U/L (ref 0–53)
AST: 24 U/L (ref 0–37)
Albumin: 4.7 g/dL (ref 3.5–5.2)
Alkaline Phosphatase: 77 U/L (ref 39–117)
BUN: 12 mg/dL (ref 6–23)
CO2: 32 mEq/L (ref 19–32)
Calcium: 10 mg/dL (ref 8.4–10.5)
Chloride: 101 mEq/L (ref 96–112)
Creatinine, Ser: 0.86 mg/dL (ref 0.40–1.50)
GFR: 84.91 mL/min (ref >60.00)
Glucose, Bld: 112 mg/dL — ABNORMAL HIGH (ref 70–99)
Potassium: 4.8 mEq/L (ref 3.5–5.1)
Sodium: 140 mEq/L (ref 135–145)
Total Bilirubin: 1.5 mg/dL — ABNORMAL HIGH (ref 0.2–1.2)
Total Protein: 7.4 g/dL (ref 6.0–8.3)

## 2019-12-21 LAB — LIPID PANEL
Cholesterol: 155 mg/dL (ref 0–200)
HDL: 37.4 mg/dL — ABNORMAL LOW (ref >39.00)
LDL Cholesterol: 87 mg/dL (ref 0–99)
NonHDL: 117.11
Total CHOL/HDL Ratio: 4
Triglycerides: 150 mg/dL — ABNORMAL HIGH (ref 0.0–149.0)
VLDL: 30 mg/dL (ref 0.0–40.0)

## 2019-12-21 LAB — HEMOGLOBIN A1C: Hgb A1c MFr Bld: 6.1 % (ref 4.6–6.5)

## 2019-12-21 MED ORDER — ATORVASTATIN CALCIUM 40 MG PO TABS: ORAL_TABLET | ORAL | 3 refills | Status: DC

## 2019-12-21 NOTE — Progress Notes (Signed)
Phone [phone] In person visit   Subjective:   Joseph Simpson is a 85 y.o. year old very pleasant male patient who presents for/with See problem oriented charting Chief Complaint  Patient presents with  . Follow-up  . Hypertension  . Hyperlipidemia    This visit occurred during the SARS-CoV-2 public health emergency.  Safety protocols were in place, including screening questions prior to the visit, additional usage of staff PPE, and extensive cleaning of exam room while observing appropriate contact time as indicated for disinfecting solutions.   Past Medical History-  Patient Active Problem List   Diagnosis Date Noted  . CAD (coronary artery disease)     Priority: High  . Macular degeneration 11/17/2011    Priority: High  . B12 deficiency 03/09/2019    Priority: Medium  . Hyperlipidemia 06/10/2016    Priority: Medium  . BPH (benign prostatic hyperplasia) 11/23/2014    Priority: Medium  . Palpitations 07/31/2014    Priority: Medium  . Dyspnea 02/27/2014    Priority: Medium  . History of melanoma- arm 09/26/2007    Priority: Medium  . Essential hypertension 09/23/2007    Priority: Medium  . GERD 09/23/2007    Priority: Medium  . Osteoarthritis of left hip 01/30/2019    Priority: Low  . 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 12/29/2017    Priority: Low  . History of hiatal hernia     Priority: Low  . PAC (premature atrial contraction) 10/25/2015    Priority: Low  . 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 Cough 05/10/2014    Priority: Low  . Pulmonary nodule 05/10/2014    Priority: Low  . PVC's (premature ventricular contractions) 09/27/2013    Priority: Low  . Abnormal stress test 10/16/2012    Priority: Low  . Murmur 02/10/2012    Priority: Low  . Nephrolithiasis 09/04/2011    Priority: Low  . Obstructive sleep apnea 09/23/2007    Priority: Low  . Syncope   . Left inguinal hernia 07/12/2016    Medications-  reviewed and updated Current Outpatient Medications  Medication Sig Dispense Refill  . albuterol (PROAIR HFA) 108 (90 Base) MCG/ACT inhaler Inhale 2 puffs into the lungs every 6 (six) hours as needed for wheezing or shortness of breath. 1 Inhaler 3  . atorvastatin (LIPITOR) 40 MG tablet Take 1 tablet, po, once a week 13 tablet 3  . bevacizumab (AVASTIN) 2.5 mg/0.1 mL SOLN 2.5 mg by Intravitreal route. Injection to eye every 8 weeks - use with vigamox    . fluticasone (FLONASE) 50 MCG/ACT nasal spray Place 2 sprays into both nostrils daily. (Patient taking differently: Place 2 sprays into both nostrils as needed for allergies. ) 16 g 3  . losartan (COZAAR) 100 MG tablet TAKE HALF TABLET BY MOUTH TWICE DAILY 90 tablet 1  . metoCLOPramide (REGLAN) 10 MG tablet Take 5 mg by mouth 2 (two) times daily.    . metoprolol tartrate (LOPRESSOR) 25 MG tablet TAKE ONE TABLET BY MOUTH TWICE DAILY  60 tablet 5  . pantoprazole (PROTONIX) 40 MG tablet Take 40 mg by mouth 2 (two) times daily.     [name] Glycol-Propyl Glycol (SYSTANE ULTRA) 0.4-0.3 % SOLN Place 1 drop into both eyes 3 (three) times daily.     Marland Kitchen VIGAMOX 0.5 % ophthalmic solution Apply 1 drop to eye as directed. Take every 8 weeks as directed - use with avastin     No current  facility-administered medications for this visit.     Objective:  BP 120/84   Pulse 75   Temp 98.1 F (36.7 C)   Ht 6\' 1"  (1.854 m)   Wt 185 lb 6.4 oz (84.1 kg)   SpO2 95%   BMI 24.46 kg/m  Gen: NAD, resting comfortably CV: RRR no murmurs rubs or gallops Lungs: CTAB no crackles, wheeze, rhonchi Ext: no edema Skin: warm, dry    Assessment and Plan   # scheduled for covid vaccine on feb 13 in [place].   #social update- doing some rabbit hunting. He mainly rides four wheeler- doesn't carry gun.   #Hypertension  S: Controlled on losartan 50 mg twice daily, metoprolol 25 mg twice daily (also helps palpitations)  History of orthostatic  hypotension-previously also on amlodipine 2.5 mg  Monitors his home blood pressures-120s/80s- occasionally up to 140  A/P:    Stable. Continue current medications.     #Hyperlipidemia/CAD S: Patient has had minimally obstructive CAD on cath 2013.  Cannot use aspirin with prior one third removal of stomach-follows with Dr. Martinique of cardiology.  He denies new shortness of breath-as needed albuterol still helping   He is not having issues sleeping on his back but if lays on his side he gets chest pain. Wakes up in the morning after laying on said and has mild chest pain in upper right chest. Usually goes away by lunch. No shortness of breath. No left arm or neck pain. No new sweating or fatigue.  Tylenol does help . Had palpitations last night for first time in a long time.   Currently listed as atorvastatin 40 mg tablet-half tablet twice a week (felt off when he took a full pill at a time) -we used atorvastatin 10 mg daily around the time of his shoulder surgery but he has declined otherwise.  LDL goal at least under 100 but would prefer under 70-he is above the goal on last check Lab Results  Component Value Date   CHOL 179 12/13/2018   HDL 32.10 (L) 12/13/2018   LDLCALC 117 (H) 12/13/2018   LDLDIRECT 87.0 06/20/2019   TRIG 147.0 12/13/2018   CHOLHDL 6 12/13/2018   A/P:  Known nonobstructive coronary artery disease. He is having chest pain that is positional and not exertional- I doubt this is cardiac/heart in nature but I want him to follow up with Dr. Martinique as he is due anyway. Also want him to follow up with Dr. Erlene Quan due to potential relation to haital hernia/GERD- though he is on max dose medicine. No aspirin due to prior stomach - if he has worsening symptoms he will let us know or seek care immediately - encouraged him to continue to try to sleep on the back- could use pillow wedge to keep him from turning over  For cholesterol - up date cholesterol today. likely continue current dose   Due to side effects of feeling lightheaded/dizzy/off   %GERD- follows with Dr. Erlene Quan in [place] S: Patient is compliant with high-dose PPI-40 mg twice a day per Dr. Erlene Quan of GI.  Also on Reglan. History of hiatal hernia. Last visit was about a year ago.  A/P: see above- want follow up with Dr. Erlene Quan  %BPH- Dr. Diona Fanti urology S: Patient remains on tamsulosin 0.4 mg daily as needed through urology A/P: stable- he is trying to schedule follow up  #kidney stones- passed 5 kidney stones last September! No issues since then.   #B12 deficiency-adequately controlled at last visit.  He continues monthly injections.  #Elevated A1c/hyperglycemia-mild elevation on last check at 5.7-update again with labs. Does get steroid injections which could raise this  # left hip arthritis- Follows Dr. Griffin Basil. Injections with Dr. Ron Agee   Recommended follow up: 6 months or sooner if needed Future Appointments  Date Time Provider [place]  01/08/2020  7:45 AM Hayden Pedro, MD [facility] None  01/11/2020  9:00 AM [facility] NURSE [facility]   Lab/Order associations:   ICD-10-CM   1. Coronary artery disease involving native coronary artery of native heart without angina pectoris  I25.10   2. Hyperlipidemia, unspecified hyperlipidemia type  E78.5 CBC with Differential/Platelet    Comprehensive metabolic panel    Lipid panel  3. Essential hypertension  I10   4. Hyperglycemia  R73.9 Hemoglobin A1c    Meds ordered this encounter  Medications  . atorvastatin (LIPITOR) 40 MG tablet    Sig: Take 1 tablet, po, once a week    Dispense:  13 tablet    Refill:  3   Return precautions advised.  Garret Reddish, MD

## 2019-12-21 NOTE — Patient Instructions (Addendum)
Please stop by lab before you go If you do not have mychart- we will call you about results within 5 business days of [place] receiving them.  If you have mychart- we will send your results within 3 business days of [place] receiving them.  If abnormal or we want to clarify a result, we will call or mychart you to make sure you receive the message.  If you have questions or concerns or don't hear within 5-7 days, please send [place] a message or call us.    Please let us know through mychart which vaccine give and the day they give it.    Known nonobstructive coronary artery disease. He is having chest pain that is positional and not exertional- I doubt this is cardiac/heart in nature but I want him to follow up with Dr. Martinique as he is due anyway. Also want him to follow up with Dr. Erlene Quan due to potential relation to haital hernia/GERD- though he is on max dose medicine. No aspirin due to prior stomach - if he has worsening symptoms he will let us know or seek care immediately - encouraged him to continue to try to sleep on the back- could use pillow wedge to keep him from turning over  Recommended follow up: 6 months or sooner if needed

## 2019-12-25 DIAGNOSIS — Z23 Encounter for immunization: Secondary | ICD-10-CM | POA: Diagnosis not present

## 2019-12-27 ENCOUNTER — Other Ambulatory Visit: Payer: Self-pay | Admitting: Family Medicine

## 2020-01-08 ENCOUNTER — Encounter (INDEPENDENT_AMBULATORY_CARE_PROVIDER_SITE_OTHER): Payer: Medicare Other | Admitting: Ophthalmology

## 2020-01-08 DIAGNOSIS — H353231 Exudative age-related macular degeneration, bilateral, with active choroidal neovascularization: Secondary | ICD-10-CM | POA: Diagnosis not present

## 2020-01-08 DIAGNOSIS — H35033 Hypertensive retinopathy, bilateral: Secondary | ICD-10-CM

## 2020-01-08 DIAGNOSIS — H43813 Vitreous degeneration, bilateral: Secondary | ICD-10-CM | POA: Diagnosis not present

## 2020-01-08 DIAGNOSIS — I10 Essential (primary) hypertension: Secondary | ICD-10-CM

## 2020-01-08 DIAGNOSIS — H33301 Unspecified retinal break, right eye: Secondary | ICD-10-CM | POA: Diagnosis not present

## 2020-01-08 DIAGNOSIS — D3132 Benign neoplasm of left choroid: Secondary | ICD-10-CM | POA: Diagnosis not present

## 2020-01-11 ENCOUNTER — Other Ambulatory Visit: Payer: Self-pay

## 2020-01-11 ENCOUNTER — Ambulatory Visit (INDEPENDENT_AMBULATORY_CARE_PROVIDER_SITE_OTHER): Payer: Medicare Other

## 2020-01-11 DIAGNOSIS — E538 Deficiency of other specified B group vitamins: Secondary | ICD-10-CM | POA: Diagnosis not present

## 2020-01-11 MED ORDER — CYANOCOBALAMIN 1000 MCG/ML IJ SOLN
1000.0000 ug | Freq: Once | INTRAMUSCULAR | Status: AC
  Administered 2020-01-11: 09:00:00 1000 ug via INTRAMUSCULAR

## 2020-01-11 NOTE — Progress Notes (Signed)
Cyanocobalamin 1000 mcg/mL, 1 mL, given IM R deltoid, pt tolerated well.

## 2020-01-15 ENCOUNTER — Other Ambulatory Visit: Payer: Self-pay | Admitting: Cardiology

## 2020-01-16 DIAGNOSIS — H353212 Exudative age-related macular degeneration, right eye, with inactive choroidal neovascularization: Secondary | ICD-10-CM | POA: Diagnosis not present

## 2020-01-16 DIAGNOSIS — H353221 Exudative age-related macular degeneration, left eye, with active choroidal neovascularization: Secondary | ICD-10-CM | POA: Diagnosis not present

## 2020-01-16 DIAGNOSIS — H35033 Hypertensive retinopathy, bilateral: Secondary | ICD-10-CM | POA: Diagnosis not present

## 2020-01-16 DIAGNOSIS — Z961 Presence of intraocular lens: Secondary | ICD-10-CM | POA: Diagnosis not present

## 2020-01-16 LAB — HM DIABETES EYE EXAM

## 2020-01-22 DIAGNOSIS — Z23 Encounter for immunization: Secondary | ICD-10-CM | POA: Diagnosis not present

## 2020-02-05 ENCOUNTER — Other Ambulatory Visit: Payer: Self-pay

## 2020-02-05 ENCOUNTER — Encounter (INDEPENDENT_AMBULATORY_CARE_PROVIDER_SITE_OTHER): Payer: Medicare Other | Admitting: Ophthalmology

## 2020-02-05 DIAGNOSIS — H43813 Vitreous degeneration, bilateral: Secondary | ICD-10-CM | POA: Diagnosis not present

## 2020-02-05 DIAGNOSIS — H35033 Hypertensive retinopathy, bilateral: Secondary | ICD-10-CM

## 2020-02-05 DIAGNOSIS — H353231 Exudative age-related macular degeneration, bilateral, with active choroidal neovascularization: Secondary | ICD-10-CM | POA: Diagnosis not present

## 2020-02-05 DIAGNOSIS — D3132 Benign neoplasm of left choroid: Secondary | ICD-10-CM

## 2020-02-05 DIAGNOSIS — H33301 Unspecified retinal break, right eye: Secondary | ICD-10-CM

## 2020-02-05 DIAGNOSIS — I10 Essential (primary) hypertension: Secondary | ICD-10-CM | POA: Diagnosis not present

## 2020-02-07 DIAGNOSIS — L570 Actinic keratosis: Secondary | ICD-10-CM | POA: Diagnosis not present

## 2020-02-07 DIAGNOSIS — L821 Other seborrheic keratosis: Secondary | ICD-10-CM | POA: Diagnosis not present

## 2020-02-07 DIAGNOSIS — C44519 Basal cell carcinoma of skin of other part of trunk: Secondary | ICD-10-CM | POA: Diagnosis not present

## 2020-02-07 DIAGNOSIS — Z85828 Personal history of other malignant neoplasm of skin: Secondary | ICD-10-CM | POA: Diagnosis not present

## 2020-02-07 DIAGNOSIS — D2271 Melanocytic nevi of right lower limb, including hip: Secondary | ICD-10-CM | POA: Diagnosis not present

## 2020-02-07 DIAGNOSIS — D485 Neoplasm of uncertain behavior of skin: Secondary | ICD-10-CM | POA: Diagnosis not present

## 2020-02-07 DIAGNOSIS — L578 Other skin changes due to chronic exposure to nonionizing radiation: Secondary | ICD-10-CM | POA: Diagnosis not present

## 2020-02-07 DIAGNOSIS — L814 Other melanin hyperpigmentation: Secondary | ICD-10-CM | POA: Diagnosis not present

## 2020-02-07 DIAGNOSIS — Z87898 Personal history of other specified conditions: Secondary | ICD-10-CM | POA: Diagnosis not present

## 2020-02-07 DIAGNOSIS — Z23 Encounter for immunization: Secondary | ICD-10-CM | POA: Diagnosis not present

## 2020-02-07 DIAGNOSIS — D2230 Melanocytic nevi of unspecified part of face: Secondary | ICD-10-CM | POA: Diagnosis not present

## 2020-02-07 DIAGNOSIS — L905 Scar conditions and fibrosis of skin: Secondary | ICD-10-CM | POA: Diagnosis not present

## 2020-02-08 ENCOUNTER — Ambulatory Visit (INDEPENDENT_AMBULATORY_CARE_PROVIDER_SITE_OTHER): Payer: Medicare Other | Admitting: *Deleted

## 2020-02-08 ENCOUNTER — Encounter: Payer: Self-pay | Admitting: *Deleted

## 2020-02-08 ENCOUNTER — Other Ambulatory Visit: Payer: Self-pay

## 2020-02-08 DIAGNOSIS — E538 Deficiency of other specified B group vitamins: Secondary | ICD-10-CM

## 2020-02-08 MED ORDER — CYANOCOBALAMIN 1000 MCG/ML IJ SOLN
1000.0000 ug | Freq: Once | INTRAMUSCULAR | Status: AC
  Administered 2020-02-08: 1000 ug via INTRAMUSCULAR

## 2020-02-08 NOTE — Progress Notes (Signed)
Per orders of Dr. Jerline Pain, injection of Cyanocobalamin 1000 mcg given by Anselmo Pickler, LPN in left deltoid. Patient tolerated injection well. Patient will make appointment for 1 month

## 2020-02-08 NOTE — Progress Notes (Signed)
I have reviewed the patient's encounter and agree with the documentation.  Algis Greenhouse. Jerline Pain, MD 02/08/2020 9:56 AM

## 2020-02-19 DIAGNOSIS — M1612 Unilateral primary osteoarthritis, left hip: Secondary | ICD-10-CM | POA: Diagnosis not present

## 2020-02-19 DIAGNOSIS — M25552 Pain in left hip: Secondary | ICD-10-CM | POA: Diagnosis not present

## 2020-02-28 DIAGNOSIS — R350 Frequency of micturition: Secondary | ICD-10-CM | POA: Diagnosis not present

## 2020-02-28 DIAGNOSIS — N210 Calculus in bladder: Secondary | ICD-10-CM | POA: Diagnosis not present

## 2020-03-04 ENCOUNTER — Other Ambulatory Visit: Payer: Self-pay

## 2020-03-04 ENCOUNTER — Encounter (INDEPENDENT_AMBULATORY_CARE_PROVIDER_SITE_OTHER): Payer: Medicare Other | Admitting: Ophthalmology

## 2020-03-04 DIAGNOSIS — I10 Essential (primary) hypertension: Secondary | ICD-10-CM

## 2020-03-04 DIAGNOSIS — H353231 Exudative age-related macular degeneration, bilateral, with active choroidal neovascularization: Secondary | ICD-10-CM | POA: Diagnosis not present

## 2020-03-04 DIAGNOSIS — H43813 Vitreous degeneration, bilateral: Secondary | ICD-10-CM

## 2020-03-04 DIAGNOSIS — H35033 Hypertensive retinopathy, bilateral: Secondary | ICD-10-CM

## 2020-03-04 DIAGNOSIS — H33301 Unspecified retinal break, right eye: Secondary | ICD-10-CM

## 2020-03-04 DIAGNOSIS — D3132 Benign neoplasm of left choroid: Secondary | ICD-10-CM | POA: Diagnosis not present

## 2020-03-05 ENCOUNTER — Other Ambulatory Visit: Payer: Self-pay | Admitting: Cardiology

## 2020-03-07 DIAGNOSIS — N200 Calculus of kidney: Secondary | ICD-10-CM | POA: Diagnosis not present

## 2020-03-12 ENCOUNTER — Other Ambulatory Visit: Payer: Self-pay

## 2020-03-12 ENCOUNTER — Ambulatory Visit (INDEPENDENT_AMBULATORY_CARE_PROVIDER_SITE_OTHER): Payer: Medicare Other

## 2020-03-12 DIAGNOSIS — E538 Deficiency of other specified B group vitamins: Secondary | ICD-10-CM

## 2020-03-12 MED ORDER — CYANOCOBALAMIN 1000 MCG/ML IJ SOLN
1000.0000 ug | Freq: Once | INTRAMUSCULAR | Status: AC
  Administered 2020-03-12: 1000 ug via INTRAMUSCULAR

## 2020-03-12 NOTE — Progress Notes (Signed)
I have reviewed and agree with note, evaluation, plan.   Dayquan Buys, MD  

## 2020-03-12 NOTE — Progress Notes (Signed)
Patient came in to the office today to receive his Vitamin B12 injection. No other questions or concerns.

## 2020-03-19 DIAGNOSIS — H04123 Dry eye syndrome of bilateral lacrimal glands: Secondary | ICD-10-CM | POA: Diagnosis not present

## 2020-03-19 DIAGNOSIS — H524 Presbyopia: Secondary | ICD-10-CM | POA: Diagnosis not present

## 2020-03-19 DIAGNOSIS — H16122 Filamentary keratitis, left eye: Secondary | ICD-10-CM | POA: Diagnosis not present

## 2020-03-19 DIAGNOSIS — D23112 Other benign neoplasm of skin of right lower eyelid, including canthus: Secondary | ICD-10-CM | POA: Diagnosis not present

## 2020-03-20 DIAGNOSIS — C44519 Basal cell carcinoma of skin of other part of trunk: Secondary | ICD-10-CM | POA: Diagnosis not present

## 2020-03-22 ENCOUNTER — Telehealth: Payer: Self-pay | Admitting: Family Medicine

## 2020-03-22 NOTE — Telephone Encounter (Signed)
See below. OV needed?  

## 2020-03-22 NOTE — Telephone Encounter (Signed)
Patient calling stating that  Dr Charlsie Quest has recommend he stop his Claritin. But he has still has allergies and needs to be on something. Patient has been advised that Dr Yong Channel is not in the office until Monday or Tuesday. Please Advise

## 2020-03-22 NOTE — Telephone Encounter (Signed)
Called pt back and he states that Dr. Charlsie Quest does not want him to take any allergy medicine because it makes him urinate frequently. Pt states he is only taking flonase PRN.

## 2020-03-22 NOTE — Telephone Encounter (Signed)
Is he still taking the flonase consistently?

## 2020-03-22 NOTE — Telephone Encounter (Signed)
Called and spoke to [place], Therapist, sports and she states Dr. Lacretia Nicks had gone into a room and she will call me back.

## 2020-03-22 NOTE — Telephone Encounter (Signed)
Team please call urology and see if they are ok with patient taking flonase- I do not see any affects on BPH/enlarged prostate listed on [email or web address]

## 2020-03-22 NOTE — Telephone Encounter (Signed)
Please let patient know okay to take Flonase per urology

## 2020-03-22 NOTE — Telephone Encounter (Signed)
Joseph Simpson returned call and states Dr. Lacretia Nicks said he is fine with pt taking flonase.

## 2020-03-25 NOTE — Telephone Encounter (Signed)
Patient aware that it is OK for him to use Flonase

## 2020-03-28 ENCOUNTER — Other Ambulatory Visit: Payer: Self-pay | Admitting: Family Medicine

## 2020-03-28 NOTE — Telephone Encounter (Signed)
Last OV 12/21/19 Last refill 12/27/19 #90/0 Next OV 06/19/20

## 2020-04-01 ENCOUNTER — Encounter (INDEPENDENT_AMBULATORY_CARE_PROVIDER_SITE_OTHER): Payer: Medicare Other | Admitting: Ophthalmology

## 2020-04-01 ENCOUNTER — Other Ambulatory Visit: Payer: Self-pay

## 2020-04-01 DIAGNOSIS — H353231 Exudative age-related macular degeneration, bilateral, with active choroidal neovascularization: Secondary | ICD-10-CM

## 2020-04-01 DIAGNOSIS — H35033 Hypertensive retinopathy, bilateral: Secondary | ICD-10-CM | POA: Diagnosis not present

## 2020-04-01 DIAGNOSIS — D3132 Benign neoplasm of left choroid: Secondary | ICD-10-CM

## 2020-04-01 DIAGNOSIS — I10 Essential (primary) hypertension: Secondary | ICD-10-CM | POA: Diagnosis not present

## 2020-04-01 DIAGNOSIS — H43813 Vitreous degeneration, bilateral: Secondary | ICD-10-CM

## 2020-04-02 NOTE — Progress Notes (Signed)
Cardiology Office Note   Date:  04/04/2020   ID:  LATONYA MALEKI, DOB 1935-06-19, MRN [id number]  PCP:  Marin Olp, MD  Cardiologist:   Safir Michalec Martinique, MD   Chief Complaint  Patient presents with  . Coronary Artery Disease      History of Present Illness: Joseph Simpson is a 85 y.o. male who presents for follow up CAD and PVCs. He has a  past medical history of hypertension, PVCs, obstructive sleep apnea not on CPAP, presyncope, and nonobstructive CAD.  He had a abnormal stress test in 2013, follow-up cardiac catheterization demonstrated nonobstructive CAD.  He states he is doing very well from a cardiac standpoint. Denies any significant palpitations this past year. Notes if he does more strenuous activity such as vacuuming or mowing the grass his palpitations will act up so he just doesn't do these things anymore. No chest pain or dyspnea. No dizziness.     Past Medical History:  Diagnosis Date  . Abnormal stress test December 2013   s/p cardiac cath with minimal nonobstructive CAD and normal LV function; medical management recommended  . Arthritis   . CAD (coronary artery disease)   . Dyslipidemia   . Dysrhythmia   . GERD (gastroesophageal reflux disease)   . History of hiatal hernia   . History of kidney stones   . Hypertension   . Macular degeneration   . Melanoma ([place])   . Obstructive sleep apnea 09/23/2007   Wore cpap previously. Stopped and not interested despite risks.     . OSA (obstructive sleep apnea)   . PAC (premature atrial contraction) 10/25/2015  . PVC's (premature ventricular contractions) 09/27/2013  . SVT (supraventricular tachycardia) ([place])   . Syncope     Past Surgical History:  Procedure Laterality Date  . APPENDECTOMY    . CARDIAC CATHETERIZATION  December 2013   minimal nonobstructive CAD with normal LV function  . CATARACT EXTRACTION    . CYSTOSCOPY WITH LITHOLAPAXY N/A 07/13/2016   Procedure: CYSTOSCOPY WITH LITHOLAPAXY;  Surgeon:  Franchot Gallo, MD;  Location: [facility];  Service: Urology;  Laterality: N/A;  . CYSTOSCOPY/RETROGRADE/URETEROSCOPY/STONE EXTRACTION WITH BASKET  02/24/2012   Procedure: CYSTOSCOPY/RETROGRADE/URETEROSCOPY/STONE EXTRACTION WITH BASKET;  Surgeon: Franchot Gallo, MD;  Location: [facility];  Service: Urology;  Laterality: Bilateral;  1 HOUR  . GASTRECTOMY    . HEMORRHOID SURGERY    . HERNIA REPAIR  07/13/2016  . INGUINAL HERNIA REPAIR Left 07/13/2016   Procedure: REPAIR LEFT INGUINAL HERNIA;  Surgeon: Armandina Gemma, MD;  Location: [facility];  Service: General;  Laterality: Left;  . INSERTION OF MESH Left 07/13/2016   Procedure: INSERTION OF MESH;  Surgeon: Armandina Gemma, MD;  Location: [facility];  Service: General;  Laterality: Left;  Marland Kitchen MELANOMA EXCISION  05-05-2007   LEFT FOREARM  . NOSE SURGERY    . TONSILLECTOMY    . TOTAL SHOULDER ARTHROPLASTY Right 12/29/2017  . TOTAL SHOULDER ARTHROPLASTY Right 12/29/2017   Procedure: TOTAL SHOULDER ARTHROPLASTY;  Surgeon: Hiram Gash, MD;  Location: [facility];  Service: Orthopedics;  Laterality: Right;     Current Outpatient Medications  Medication Sig Dispense Refill  . albuterol (PROAIR HFA) 108 (90 Base) MCG/ACT inhaler Inhale 2 puffs into the lungs every 6 (six) hours as needed for wheezing or shortness of breath. 1 Inhaler 3  . atorvastatin (LIPITOR) 40 MG tablet Take 1 tablet, po, once a week 13 tablet 3  . bevacizumab (AVASTIN) 2.5 mg/0.1 mL SOLN  2.5 mg by Intravitreal route. Injection to eye every 8 weeks - use with vigamox    . fluticasone (FLONASE) 50 MCG/ACT nasal spray Place 2 sprays into both nostrils daily. (Patient taking differently: Place 2 sprays into both nostrils as needed for allergies. ) 16 g 3  . losartan (COZAAR) 100 MG tablet TAKE HALF TABLET BY MOUTH TWICE DAILY 90 tablet 1  . metoCLOPramide (REGLAN) 10 MG tablet Take 5 mg by mouth 2 (two) times daily.    . metoprolol tartrate (LOPRESSOR) 25 MG tablet TAKE ONE TABLET BY  MOUTH TWICE DAILY  180 tablet 0  . pantoprazole (PROTONIX) 40 MG tablet Take 40 mg by mouth 2 (two) times daily.     [name] Glycol-Propyl Glycol (SYSTANE ULTRA) 0.4-0.3 % SOLN Place 1 drop into both eyes 3 (three) times daily.     . tamsulosin (FLOMAX) 0.4 MG CAPS capsule Take 0.4 mg by mouth daily.    Marland Kitchen VIGAMOX 0.5 % ophthalmic solution Apply 1 drop to eye as directed. Take every 8 weeks as directed - use with avastin     No current facility-administered medications for this visit.    Allergies:   Aspirin, Percocet [oxycodone-acetaminophen], and Vitamins [apatate]    Social History:  The patient  reports that he quit smoking about 63 years ago. His smoking use included cigarettes. He has a 0.40 pack-year smoking history. He has never used smokeless tobacco. He reports that he does not drink alcohol or use drugs.   Family History:  The patient's family history includes Cancer in his sister; Heart disease in his mother; Lung cancer in his brother; Pneumonia in his father.    ROS:  Please see the history of present illness.   Otherwise, review of systems are positive for none.   All other systems are reviewed and negative.    PHYSICAL EXAM: VS:  BP 110/72   Pulse (!) 55   Ht 6\' 1"  (1.854 m)   Wt 182 lb 9.6 oz (82.8 kg)   SpO2 96%   BMI 24.09 kg/m  , BMI Body mass index is 24.09 kg/m. GEN: Well nourished, well developed, in no acute distress  HEENT: normal  Neck: no JVD, carotid bruits, or masses Cardiac: RRR; no murmurs, rubs, or gallops,no edema  Respiratory:  clear to auscultation bilaterally, normal work of breathing GI: soft, nontender, nondistended, + BS MS: no deformity or atrophy  Skin: warm and dry, no rash Neuro:  Strength and sensation are intact Psych: euthymic mood, full affect   EKG:  EKG is ordered today. The ekg ordered today demonstrates NSR rate 55, nonspecific IVCD. No PVCs. I have personally reviewed and interpreted this study.    Recent  Labs: 12/21/2019: ALT 31; BUN 12; Creatinine, Ser 0.86; Hemoglobin 16.6; Platelets 158.0; Potassium 4.8; Sodium 140    Lipid Panel    Component Value Date/Time   CHOL 155 12/21/2019 1006   TRIG 150.0 (H) 12/21/2019 1006   HDL 37.40 (L) 12/21/2019 1006   CHOLHDL 4 12/21/2019 1006   VLDL 30.0 12/21/2019 1006   LDLCALC 87 12/21/2019 1006   LDLDIRECT 87.0 06/20/2019 1119      Wt Readings from Last 3 Encounters:  04/04/20 182 lb 9.6 oz (82.8 kg)  12/21/19 185 lb 6.4 oz (84.1 kg)  07/28/19 179 lb 3.2 oz (81.3 kg)      Other studies Reviewed: Additional studies/ records that were reviewed today include: none. Review of the above records demonstrates: N/A   ASSESSMENT AND  PLAN:  1.  PVCs: He has not had any significant palpitation recently.  Continue on current therapy with metoprolol  2. Hypertension: Blood pressure well controlled on current therapy.  3. Hyperlipidemia: LDL is acceptable on current lipitor does.     4.   Nonobstructive CAD: Seen on previous cardiac catheterization in 2013.  Denies any recent chest discomfort.   Current medicines are reviewed at length with the patient today.  The patient does not have concerns regarding medicines.  The following changes have been made:  no change  Labs/ tests ordered today include:  No orders of the defined types were placed in this encounter.    Disposition:   FU with me in 1 year  Signed, Keyvon Herter Martinique, MD  04/04/2020 9:45 AM    [facility] [address] [place], [place], [address] Phone [phone], Fax [phone]

## 2020-04-04 ENCOUNTER — Ambulatory Visit (INDEPENDENT_AMBULATORY_CARE_PROVIDER_SITE_OTHER): Payer: Medicare Other | Admitting: Cardiology

## 2020-04-04 ENCOUNTER — Encounter: Payer: Self-pay | Admitting: Cardiology

## 2020-04-04 ENCOUNTER — Other Ambulatory Visit: Payer: Self-pay

## 2020-04-04 VITALS — BP 110/72 | HR 55 | Ht 73.0 in | Wt 182.6 lb

## 2020-04-04 DIAGNOSIS — I493 Ventricular premature depolarization: Secondary | ICD-10-CM | POA: Diagnosis not present

## 2020-04-04 DIAGNOSIS — E785 Hyperlipidemia, unspecified: Secondary | ICD-10-CM

## 2020-04-04 DIAGNOSIS — I2510 Atherosclerotic heart disease of native coronary artery without angina pectoris: Secondary | ICD-10-CM

## 2020-04-04 DIAGNOSIS — I10 Essential (primary) hypertension: Secondary | ICD-10-CM

## 2020-04-11 ENCOUNTER — Other Ambulatory Visit: Payer: Self-pay

## 2020-04-11 ENCOUNTER — Ambulatory Visit (INDEPENDENT_AMBULATORY_CARE_PROVIDER_SITE_OTHER): Payer: Medicare Other

## 2020-04-11 DIAGNOSIS — E538 Deficiency of other specified B group vitamins: Secondary | ICD-10-CM | POA: Diagnosis not present

## 2020-04-11 MED ORDER — CYANOCOBALAMIN 1000 MCG/ML IJ SOLN
1000.0000 ug | Freq: Once | INTRAMUSCULAR | Status: AC
  Administered 2020-04-11: 1000 ug via INTRAMUSCULAR

## 2020-04-11 NOTE — Progress Notes (Signed)
I have reviewed and agree with note, evaluation, plan.   Shantil Vallejo, MD  

## 2020-04-11 NOTE — Progress Notes (Signed)
Patient came in to receive his vitamin b12 injection. He tolerated the injection well.

## 2020-04-16 DIAGNOSIS — Z5189 Encounter for other specified aftercare: Secondary | ICD-10-CM | POA: Diagnosis not present

## 2020-04-16 DIAGNOSIS — T8130XA Disruption of wound, unspecified, initial encounter: Secondary | ICD-10-CM | POA: Diagnosis not present

## 2020-04-29 ENCOUNTER — Other Ambulatory Visit: Payer: Self-pay

## 2020-04-29 ENCOUNTER — Encounter (INDEPENDENT_AMBULATORY_CARE_PROVIDER_SITE_OTHER): Payer: Medicare Other | Admitting: Ophthalmology

## 2020-04-29 DIAGNOSIS — H43813 Vitreous degeneration, bilateral: Secondary | ICD-10-CM

## 2020-04-29 DIAGNOSIS — I10 Essential (primary) hypertension: Secondary | ICD-10-CM | POA: Diagnosis not present

## 2020-04-29 DIAGNOSIS — H353231 Exudative age-related macular degeneration, bilateral, with active choroidal neovascularization: Secondary | ICD-10-CM

## 2020-04-29 DIAGNOSIS — H33301 Unspecified retinal break, right eye: Secondary | ICD-10-CM

## 2020-04-29 DIAGNOSIS — D3132 Benign neoplasm of left choroid: Secondary | ICD-10-CM

## 2020-04-29 DIAGNOSIS — H35033 Hypertensive retinopathy, bilateral: Secondary | ICD-10-CM

## 2020-05-14 ENCOUNTER — Other Ambulatory Visit: Payer: Self-pay

## 2020-05-14 ENCOUNTER — Ambulatory Visit (INDEPENDENT_AMBULATORY_CARE_PROVIDER_SITE_OTHER): Payer: Medicare Other

## 2020-05-14 DIAGNOSIS — E538 Deficiency of other specified B group vitamins: Secondary | ICD-10-CM

## 2020-05-14 MED ORDER — CYANOCOBALAMIN 1000 MCG/ML IJ SOLN
1000.0000 ug | Freq: Once | INTRAMUSCULAR | Status: AC
  Administered 2020-05-14: 1000 ug via INTRAMUSCULAR

## 2020-05-14 NOTE — Progress Notes (Signed)
Per orders of Dr. Hunter, injection of Vitamin B12 given by Oneda Duffett D Shenika Quint in right deltoid. Patient tolerated injection well. Patient will make appointment for 1 month.   

## 2020-05-27 ENCOUNTER — Encounter (INDEPENDENT_AMBULATORY_CARE_PROVIDER_SITE_OTHER): Payer: Medicare Other | Admitting: Ophthalmology

## 2020-05-27 ENCOUNTER — Other Ambulatory Visit: Payer: Self-pay

## 2020-05-27 DIAGNOSIS — H43813 Vitreous degeneration, bilateral: Secondary | ICD-10-CM

## 2020-05-27 DIAGNOSIS — D3132 Benign neoplasm of left choroid: Secondary | ICD-10-CM

## 2020-05-27 DIAGNOSIS — H33301 Unspecified retinal break, right eye: Secondary | ICD-10-CM | POA: Diagnosis not present

## 2020-05-27 DIAGNOSIS — H35033 Hypertensive retinopathy, bilateral: Secondary | ICD-10-CM | POA: Diagnosis not present

## 2020-05-27 DIAGNOSIS — H353231 Exudative age-related macular degeneration, bilateral, with active choroidal neovascularization: Secondary | ICD-10-CM | POA: Diagnosis not present

## 2020-05-27 DIAGNOSIS — I10 Essential (primary) hypertension: Secondary | ICD-10-CM | POA: Diagnosis not present

## 2020-06-10 ENCOUNTER — Other Ambulatory Visit: Payer: Self-pay | Admitting: Cardiology

## 2020-06-13 ENCOUNTER — Other Ambulatory Visit: Payer: Self-pay

## 2020-06-13 ENCOUNTER — Ambulatory Visit (INDEPENDENT_AMBULATORY_CARE_PROVIDER_SITE_OTHER): Payer: Medicare Other

## 2020-06-13 DIAGNOSIS — E538 Deficiency of other specified B group vitamins: Secondary | ICD-10-CM

## 2020-06-13 MED ORDER — CYANOCOBALAMIN 1000 MCG/ML IJ SOLN
1000.0000 ug | Freq: Once | INTRAMUSCULAR | Status: AC
  Administered 2020-06-13: 1000 ug via INTRAMUSCULAR

## 2020-06-13 NOTE — Progress Notes (Signed)
I have reviewed and agree with note, evaluation, plan.   Avontae Burkhead, MD  

## 2020-06-13 NOTE — Progress Notes (Signed)
Patient came into the office to receive his Vitamin B12 injection. He tolerated injection well. No questions or concerns at this time.

## 2020-06-18 NOTE — Patient Instructions (Addendum)
Health Maintenance Due  Topic Date Due  . INFLUENZA VACCINE will get in the fall . If you get this at pharmacy please let us know. We should have available in 1-2 months 06/16/2020   Great job on cutting out the ice cream and weight loss  Blood pressure looks great!   May go to 3x a week with 20mg  pill for cholesterol if LDL/bad cholesterol is above 70   Please stop by lab before you go If you have mychart- we will send your results within 3 business days of [place] receiving them.  If you do not have mychart- we will call you about results within 5 business days of [place] receiving them.  *please note we are currently using [facility] which has a longer processing time than [place] typically so labs may not come back as quickly as in the past *please also note that you will see labs on mychart as soon as they post. I will later go in and write notes on them- will say "notes from Dr. Yong Channel"

## 2020-06-18 NOTE — Progress Notes (Signed)
Phone [phone] In person visit   Subjective:   Joseph Simpson is a 85 y.o. year old very pleasant male patient who presents for/with See problem oriented charting Chief Complaint  Patient presents with  . Hypertension   This visit occurred during the SARS-CoV-2 public health emergency.  Safety protocols were in place, including screening questions prior to the visit, additional usage of staff PPE, and extensive cleaning of exam room while observing appropriate contact time as indicated for disinfecting solutions.   Past Medical History-  Patient Active Problem List   Diagnosis Date Noted  . CAD (coronary artery disease)     Priority: High  . Macular degeneration 11/17/2011    Priority: High  . Hyperglycemia 12/21/2019    Priority: Medium  . B12 deficiency 03/09/2019    Priority: Medium  . Hyperlipidemia 06/10/2016    Priority: Medium  . BPH (benign prostatic hyperplasia) 11/23/2014    Priority: Medium  . Palpitations 07/31/2014    Priority: Medium  . Dyspnea 02/27/2014    Priority: Medium  . History of melanoma- arm 09/26/2007    Priority: Medium  . Essential hypertension 09/23/2007    Priority: Medium  . GERD 09/23/2007    Priority: Medium  . Osteoarthritis of left hip 01/30/2019    Priority: Low  . 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 12/29/2017    Priority: Low  . History of hiatal hernia     Priority: Low  . PAC (premature atrial contraction) 10/25/2015    Priority: Low  . 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 Cough 05/10/2014    Priority: Low  . Pulmonary nodule 05/10/2014    Priority: Low  . PVC's (premature ventricular contractions) 09/27/2013    Priority: Low  . Abnormal stress test 10/16/2012    Priority: Low  . Murmur 02/10/2012    Priority: Low  . Nephrolithiasis 09/04/2011    Priority: Low  . Obstructive sleep apnea 09/23/2007    Priority: Low  . Syncope   . Left inguinal hernia 07/12/2016     Medications- reviewed and updated Current Outpatient Medications  Medication Sig Dispense Refill  . albuterol (PROAIR HFA) 108 (90 Base) MCG/ACT inhaler Inhale 2 puffs into the lungs every 6 (six) hours as needed for wheezing or shortness of breath. 1 Inhaler 3  . atorvastatin (LIPITOR) 40 MG tablet Take 1 tablet, po, once a week 13 tablet 3  . bevacizumab (AVASTIN) 2.5 mg/0.1 mL SOLN 2.5 mg by Intravitreal route. Injection to eye every 8 weeks - use with vigamox    . fluticasone (FLONASE) 50 MCG/ACT nasal spray Place 2 sprays into both nostrils daily. (Patient taking differently: Place 2 sprays into both nostrils as needed for allergies. ) 16 g 3  . losartan (COZAAR) 100 MG tablet TAKE HALF TABLET BY MOUTH TWICE DAILY 90 tablet 1  . metoCLOPramide (REGLAN) 10 MG tablet Take 5 mg by mouth 2 (two) times daily.    . metoprolol tartrate (LOPRESSOR) 25 MG tablet TAKE ONE TABLET BY MOUTH TWICE DAILY 180 tablet 2  . pantoprazole (PROTONIX) 40 MG tablet Take 40 mg by mouth 2 (two) times daily.     [name] Glycol-Propyl Glycol (SYSTANE ULTRA) 0.4-0.3 % SOLN Place 1 drop into both eyes 3 (three) times daily.     . tamsulosin (FLOMAX) 0.4 MG CAPS capsule Take 0.4 mg by mouth daily.    Marland Kitchen VIGAMOX 0.5 % ophthalmic solution Apply 1 drop to eye as  directed. Take every 8 weeks as directed - use with avastin     No current facility-administered medications for this visit.     Objective:  BP 120/74   Pulse 68   Temp 98.4 F (36.9 C) (Temporal)   Ht 6\' 1"  (1.854 m)   Wt 178 lb (80.7 kg)   SpO2 98%   BMI 23.48 kg/m  Gen: NAD, resting comfortably CV: RRR no murmurs rubs or gallops Lungs: CTAB no crackles, wheeze, rhonchi Abdomen: soft/nontender/nondistended/normal bowel sounds. No rebound or guarding.  Ext: no edema Skin: warm, dry     Assessment and Plan   #Hypertension  S: Controlled on losartan 50 mg twice daily, metoprolol 25 mg twice daily (also helps palpitations)  History of  orthostatic hypotension-previously also on amlodipine 2.5 mg- no issues recently  Monitors his home blood pressures- has log for review today and average <140/90. Out of 16 readings only 3 slightly above 476 and diastolic all 80 or less.  BP Readings from Last 3 Encounters:  06/19/20 120/74  04/04/20 110/72  12/21/19 120/84  A/P: Stable/well controlled. Continue current medications.      #Hyperlipidemia/CAD S: Patient has had minimally obstructive CAD on cath 2013.  Cannot use aspirin with prior one third removal of stomach-follows with Dr. Martinique .   Currently listed as atorvastatin 40 mg tablet-half tablet twice a week -  LDL goal at least under 100 but would prefer under 70-he is above the goal on last check. felt off/dizzy on full atorvastatin 40mg  weekly Lab Results  Component Value Date   CHOL 155 12/21/2019   HDL 37.40 (L) 12/21/2019   LDLCALC 87 12/21/2019   LDLDIRECT 87.0 06/20/2019   TRIG 150.0 (H) 12/21/2019   CHOLHDL 4 12/21/2019  A/P: hopefully stable to improved- update LDL today- if still above 70- We discussed changing to 20mg  pill 3x a week after he finishes current supply   - slightly elevated bilirubin last visit- check again today. With prior right sided pain- consider ultrasound if still high  For nonobstructive CAD- asymptomatic. Does get some MSK chest pain if lays on right or left side. Cant lay on back due to hip- has been propping with a pillow just slightly off the back but not full on side and chest pain better. No chest pain with exertion. Yearly follow up with Dr. Martinique planned  %GERD- follows with Dr. Erlene Quan in [place] S: Patient is compliant with high-dose PPI-40 mg twice a day per Dr. Erlene Quan of GI.  Also on Reglan. A/P: stable- continue GI follow up   #B12 injections- continues once a month here. Did not tolerate oral Lab Results  Component Value Date   VITAMINB12 474 06/20/2019   %BPH- Dr. Diona Fanti urology S: Patient remains on tamsulosin 0.4  mg daily as needed through urology A/P: Stable. Continue current medications.   # Hyperglycemia/insulin resistance/prediabetes S:  Medication: none Exercise and diet-  Walking as much as he can other than hip causing issues. States gets palpitations with anything like biking. Weight down 7 lbs from last visit. Trying to eat a healthy diet.  Lab Results  Component Value Date   HGBA1C 6.1 12/21/2019   Wt Readings from Last 3 Encounters:  06/19/20 178 lb (80.7 kg)  04/04/20 182 lb 9.6 oz (82.8 kg)  12/21/19 185 lb 6.4 oz (84.1 kg)   A/P: hopefully controlled with weight loss (cut out ice cream). Repeat a1c with labs today  # some right sided abdominal pain for a few  months- stopped his blueberries with breakfast  A few days agoand that seems to have helped- he will let me know if recurs.   Recommended follow up: Return in about 6 months (around 12/20/2020) for follow up- or sooner if needed. Future Appointments  Date Time Provider [place]  06/24/2020  8:15 AM Hayden Pedro, MD [facility] None  07/16/2020  9:00 AM [facility] NURSE [facility]    Lab/Order associations:   ICD-10-CM   1. Essential hypertension  I10   2. Hyperlipidemia, unspecified hyperlipidemia type  E78.5 Direct LDL  3. Coronary artery disease involving native coronary artery of native heart without angina pectoris  I25.10   4. Elevated bilirubin  R17 Comprehensive metabolic panel  5. Hyperglycemia  R73.9 Hemoglobin A1c    No orders of the defined types were placed in this encounter.    Return precautions advised.  Garret Reddish, MD

## 2020-06-19 ENCOUNTER — Other Ambulatory Visit: Payer: Self-pay

## 2020-06-19 ENCOUNTER — Encounter: Payer: Self-pay | Admitting: Family Medicine

## 2020-06-19 ENCOUNTER — Ambulatory Visit (INDEPENDENT_AMBULATORY_CARE_PROVIDER_SITE_OTHER): Payer: Medicare Other | Admitting: Family Medicine

## 2020-06-19 VITALS — BP 120/74 | HR 68 | Temp 98.4°F | Ht 73.0 in | Wt 178.0 lb

## 2020-06-19 DIAGNOSIS — R17 Unspecified jaundice: Secondary | ICD-10-CM | POA: Diagnosis not present

## 2020-06-19 DIAGNOSIS — E785 Hyperlipidemia, unspecified: Secondary | ICD-10-CM

## 2020-06-19 DIAGNOSIS — I10 Essential (primary) hypertension: Secondary | ICD-10-CM | POA: Diagnosis not present

## 2020-06-19 DIAGNOSIS — I2510 Atherosclerotic heart disease of native coronary artery without angina pectoris: Secondary | ICD-10-CM | POA: Diagnosis not present

## 2020-06-19 DIAGNOSIS — R739 Hyperglycemia, unspecified: Secondary | ICD-10-CM

## 2020-06-20 LAB — COMPREHENSIVE METABOLIC PANEL
AG Ratio: 1.8 (calc) (ref 1.0–2.5)
ALT: 19 U/L (ref 9–46)
AST: 20 U/L (ref 10–35)
Albumin: 4.6 g/dL (ref 3.6–5.1)
Alkaline phosphatase (APISO): 59 U/L (ref 35–144)
BUN: 10 mg/dL (ref 7–25)
CO2: 29 mmol/L (ref 20–32)
Calcium: 9.8 mg/dL (ref 8.6–10.3)
Chloride: 102 mmol/L (ref 98–110)
Creat: 0.8 mg/dL (ref 0.70–1.11)
Globulin: 2.5 g/dL (calc) (ref 1.9–3.7)
Glucose, Bld: 111 mg/dL — ABNORMAL HIGH (ref 65–99)
Potassium: 4.1 mmol/L (ref 3.5–5.3)
Sodium: 140 mmol/L (ref 135–146)
Total Bilirubin: 1 mg/dL (ref 0.2–1.2)
Total Protein: 7.1 g/dL (ref 6.1–8.1)

## 2020-06-20 LAB — LDL CHOLESTEROL, DIRECT: Direct LDL: 83 mg/dL (ref <100)

## 2020-06-20 LAB — HEMOGLOBIN A1C
Hgb A1c MFr Bld: 6 % of total Hgb — ABNORMAL HIGH (ref <5.7)
Mean Plasma Glucose: 126 (calc)
eAG (mmol/L): 7 (calc)

## 2020-06-21 ENCOUNTER — Other Ambulatory Visit: Payer: Self-pay

## 2020-06-21 MED ORDER — ATORVASTATIN CALCIUM 20 MG PO TABS: ORAL_TABLET | ORAL | 3 refills | Status: DC

## 2020-06-24 ENCOUNTER — Encounter (INDEPENDENT_AMBULATORY_CARE_PROVIDER_SITE_OTHER): Payer: Medicare Other | Admitting: Ophthalmology

## 2020-06-24 ENCOUNTER — Other Ambulatory Visit: Payer: Self-pay

## 2020-06-24 DIAGNOSIS — D3131 Benign neoplasm of right choroid: Secondary | ICD-10-CM

## 2020-06-24 DIAGNOSIS — H353231 Exudative age-related macular degeneration, bilateral, with active choroidal neovascularization: Secondary | ICD-10-CM | POA: Diagnosis not present

## 2020-06-24 DIAGNOSIS — I10 Essential (primary) hypertension: Secondary | ICD-10-CM | POA: Diagnosis not present

## 2020-06-24 DIAGNOSIS — H43813 Vitreous degeneration, bilateral: Secondary | ICD-10-CM | POA: Diagnosis not present

## 2020-06-24 DIAGNOSIS — H33301 Unspecified retinal break, right eye: Secondary | ICD-10-CM | POA: Diagnosis not present

## 2020-06-24 DIAGNOSIS — H35033 Hypertensive retinopathy, bilateral: Secondary | ICD-10-CM

## 2020-07-16 ENCOUNTER — Ambulatory Visit (INDEPENDENT_AMBULATORY_CARE_PROVIDER_SITE_OTHER): Payer: Medicare Other

## 2020-07-16 ENCOUNTER — Other Ambulatory Visit: Payer: Self-pay

## 2020-07-16 DIAGNOSIS — E538 Deficiency of other specified B group vitamins: Secondary | ICD-10-CM | POA: Diagnosis not present

## 2020-07-16 MED ORDER — CYANOCOBALAMIN 1000 MCG/ML IJ SOLN
1000.0000 ug | Freq: Once | INTRAMUSCULAR | Status: AC
  Administered 2020-07-16: 1000 ug via INTRAMUSCULAR

## 2020-07-16 NOTE — Progress Notes (Signed)
Per orders of Dr. Yong Channel, injection of Vitamin B12 given by Tobe Sos in left deltoid. Patient tolerated injection well. Patient will make appointment for 1 month.

## 2020-07-17 DIAGNOSIS — M1612 Unilateral primary osteoarthritis, left hip: Secondary | ICD-10-CM | POA: Diagnosis not present

## 2020-07-18 DIAGNOSIS — H353231 Exudative age-related macular degeneration, bilateral, with active choroidal neovascularization: Secondary | ICD-10-CM | POA: Diagnosis not present

## 2020-07-18 DIAGNOSIS — H04123 Dry eye syndrome of bilateral lacrimal glands: Secondary | ICD-10-CM | POA: Diagnosis not present

## 2020-07-18 DIAGNOSIS — H16122 Filamentary keratitis, left eye: Secondary | ICD-10-CM | POA: Diagnosis not present

## 2020-07-18 DIAGNOSIS — H524 Presbyopia: Secondary | ICD-10-CM | POA: Diagnosis not present

## 2020-07-23 ENCOUNTER — Encounter (INDEPENDENT_AMBULATORY_CARE_PROVIDER_SITE_OTHER): Payer: Medicare Other | Admitting: Ophthalmology

## 2020-07-23 ENCOUNTER — Other Ambulatory Visit: Payer: Self-pay

## 2020-07-23 DIAGNOSIS — H43813 Vitreous degeneration, bilateral: Secondary | ICD-10-CM | POA: Diagnosis not present

## 2020-07-23 DIAGNOSIS — H33301 Unspecified retinal break, right eye: Secondary | ICD-10-CM | POA: Diagnosis not present

## 2020-07-23 DIAGNOSIS — D3132 Benign neoplasm of left choroid: Secondary | ICD-10-CM | POA: Diagnosis not present

## 2020-07-23 DIAGNOSIS — H353231 Exudative age-related macular degeneration, bilateral, with active choroidal neovascularization: Secondary | ICD-10-CM | POA: Diagnosis not present

## 2020-07-23 DIAGNOSIS — H35033 Hypertensive retinopathy, bilateral: Secondary | ICD-10-CM | POA: Diagnosis not present

## 2020-07-23 DIAGNOSIS — I10 Essential (primary) hypertension: Secondary | ICD-10-CM

## 2020-07-26 ENCOUNTER — Telehealth: Payer: Self-pay | Admitting: Cardiology

## 2020-07-26 NOTE — Telephone Encounter (Signed)
Pt c/o BP issue: STAT if pt c/o blurred vision, one-sided weakness or slurred speech  1. What are your last 5 BP readings? Today 131/70 pulse Wednesday 99/54 pulse 49  2. Are you having any other symptoms (ex. Dizziness, headache, blurred vision, passed out)? Weak   3. What is your BP issue? BP going up and down pulse running low

## 2020-07-26 NOTE — Telephone Encounter (Signed)
Agree 

## 2020-07-26 NOTE — Telephone Encounter (Signed)
Spoke to patient he stated his B/P has been low the past couple of days.[id number].Pulse 49,45.This morning 131/70 pulse 59.Stated he feels ok.Stated he always feels alittle tired.Stated he saw PCP last week and he increased Lipitor to 20 mg 3 times a week.Advised B/P readings and pulse are ok.Advised to continue to monitor and if systolic B/P consistently under 90 to call back.I will send message to [place] for review.

## 2020-08-13 ENCOUNTER — Other Ambulatory Visit: Payer: Self-pay

## 2020-08-13 ENCOUNTER — Ambulatory Visit (INDEPENDENT_AMBULATORY_CARE_PROVIDER_SITE_OTHER): Payer: Medicare Other

## 2020-08-13 DIAGNOSIS — E538 Deficiency of other specified B group vitamins: Secondary | ICD-10-CM

## 2020-08-13 DIAGNOSIS — Z23 Encounter for immunization: Secondary | ICD-10-CM | POA: Diagnosis not present

## 2020-08-13 MED ORDER — CYANOCOBALAMIN 1000 MCG/ML IJ SOLN
1000.0000 ug | Freq: Once | INTRAMUSCULAR | Status: AC
  Administered 2020-08-13: 1000 ug via INTRAMUSCULAR

## 2020-08-13 NOTE — Progress Notes (Signed)
I have reviewed and agree with note, evaluation, plan.   Ranger Petrich, MD  

## 2020-08-13 NOTE — Progress Notes (Signed)
Joseph Simpson 85 y.o. male presents to office today for his monthly B12 injection per Garret Reddish, MD. Administered CYANOCOBALAMIN 1,000 mcg per 1 mL IM right arm. Patient tolerated well. Aware to return next month for his next injection.

## 2020-08-20 ENCOUNTER — Other Ambulatory Visit: Payer: Self-pay

## 2020-08-20 ENCOUNTER — Encounter (INDEPENDENT_AMBULATORY_CARE_PROVIDER_SITE_OTHER): Payer: Medicare Other | Admitting: Ophthalmology

## 2020-08-20 DIAGNOSIS — H33301 Unspecified retinal break, right eye: Secondary | ICD-10-CM | POA: Diagnosis not present

## 2020-08-20 DIAGNOSIS — H353231 Exudative age-related macular degeneration, bilateral, with active choroidal neovascularization: Secondary | ICD-10-CM | POA: Diagnosis not present

## 2020-08-20 DIAGNOSIS — D3132 Benign neoplasm of left choroid: Secondary | ICD-10-CM

## 2020-08-20 DIAGNOSIS — H43813 Vitreous degeneration, bilateral: Secondary | ICD-10-CM | POA: Diagnosis not present

## 2020-08-20 DIAGNOSIS — H35033 Hypertensive retinopathy, bilateral: Secondary | ICD-10-CM | POA: Diagnosis not present

## 2020-08-20 DIAGNOSIS — I10 Essential (primary) hypertension: Secondary | ICD-10-CM

## 2020-08-29 ENCOUNTER — Ambulatory Visit (INDEPENDENT_AMBULATORY_CARE_PROVIDER_SITE_OTHER): Payer: Medicare Other

## 2020-08-29 ENCOUNTER — Other Ambulatory Visit: Payer: Self-pay

## 2020-08-29 DIAGNOSIS — Z0000 Encounter for general adult medical examination without abnormal findings: Secondary | ICD-10-CM | POA: Diagnosis not present

## 2020-08-29 NOTE — Progress Notes (Signed)
Virtual Visit via Telephone Note  I connected with  Joseph Simpson on 08/29/20 at  8:45 AM EDT by telephone and verified that I am speaking with the correct person using two identifiers.  Medicare Annual Wellness visit completed telephonically due to Covid-19 pandemic.   Persons participating in this call: This Health Coach and this patient.   Location: Patient: Home Provider: Office   I discussed the limitations, risks, security and privacy concerns of performing an evaluation and management service by telephone and the availability of in person appointments. The patient expressed understanding and agreed to proceed.  Unable to perform video visit due to video visit attempted and failed and/or patient does not have video capability.   Some vital signs may be absent or patient reported.   Willette Brace, LPN    Subjective:   Joseph Simpson is a 85 y.o. male who presents for Medicare Annual/Subsequent preventive examination.  Review of Systems     Cardiac Risk Factors include: dyslipidemia;male gender;hypertension     Objective:    There were no vitals filed for this visit. There is no height or weight on file to calculate BMI.  Advanced Directives 08/29/2020 07/28/2019 06/16/2018 12/30/2017 12/15/2017 06/02/2017 07/20/2016  Does Patient Have a Medical Advance Directive? Yes Yes Yes Yes Yes Yes -  Type of Paramedic of [place];Living will [place];Living will - Living will [place];Living will [place];Living will Living will  Does patient want to make changes to medical advance directive? - No - Patient declined - No - Patient declined - No - Patient declined No - Patient declined  Copy of [place] in Chart? No - copy requested No - copy requested - - No - copy requested No - copy requested -  Would patient like information on creating a medical advance directive? - - - - - - -    Pre-existing out of facility DNR order (yellow form or pink MOST form) - - - - - - -    Current Medications (verified) Outpatient Encounter Medications as of 08/29/2020  Medication Sig  . albuterol (PROAIR HFA) 108 (90 Base) MCG/ACT inhaler Inhale 2 puffs into the lungs every 6 (six) hours as needed for wheezing or shortness of breath.  Marland Kitchen atorvastatin (LIPITOR) 20 MG tablet Take 20 mg three times a week  . bevacizumab (AVASTIN) 2.5 mg/0.1 mL SOLN 2.5 mg by Intravitreal route. Injection to eye every 8 weeks - use with vigamox  . Cyanocobalamin (B-12 COMPLIANCE INJECTION IJ) Inject as directed. Once a month  . fluticasone (FLONASE) 50 MCG/ACT nasal spray Place 2 sprays into both nostrils daily. (Patient taking differently: Place 2 sprays into both nostrils as needed for allergies. )  . losartan (COZAAR) 100 MG tablet TAKE HALF TABLET BY MOUTH TWICE DAILY  . metoCLOPramide (REGLAN) 10 MG tablet Take 5 mg by mouth 2 (two) times daily.  . metoprolol tartrate (LOPRESSOR) 25 MG tablet TAKE ONE TABLET BY MOUTH TWICE DAILY  . pantoprazole (PROTONIX) 40 MG tablet Take 40 mg by mouth 2 (two) times daily.   [name] Glycol-Propyl Glycol (SYSTANE ULTRA) 0.4-0.3 % SOLN Place 1 drop into both eyes 3 (three) times daily.   . tamsulosin (FLOMAX) 0.4 MG CAPS capsule Take 0.4 mg daily as needed  . VIGAMOX 0.5 % ophthalmic solution Apply 1 drop to eye as directed. Take every 8 weeks as directed - use with avastin   No facility-administered  encounter medications on file as of 08/29/2020.    Allergies (verified) Aspirin, Percocet [oxycodone-acetaminophen], and Vitamins [apatate]   History: Past Medical History:  Diagnosis Date  . Abnormal stress test December 2013   s/p cardiac cath with minimal nonobstructive CAD and normal LV function; medical management recommended  . Arthritis   . CAD (coronary artery disease)   . Dyslipidemia   . Dysrhythmia   . GERD (gastroesophageal reflux disease)   .  History of hiatal hernia   . History of kidney stones   . Hypertension   . Macular degeneration   . Melanoma ([place])   . Obstructive sleep apnea 09/23/2007   Wore cpap previously. Stopped and not interested despite risks.     . OSA (obstructive sleep apnea)   . PAC (premature atrial contraction) 10/25/2015  . PVC's (premature ventricular contractions) 09/27/2013  . SVT (supraventricular tachycardia) ([place])   . Syncope    Past Surgical History:  Procedure Laterality Date  . APPENDECTOMY    . CARDIAC CATHETERIZATION  December 2013   minimal nonobstructive CAD with normal LV function  . CATARACT EXTRACTION    . CYSTOSCOPY WITH LITHOLAPAXY N/A 07/13/2016   Procedure: CYSTOSCOPY WITH LITHOLAPAXY;  Surgeon: Franchot Gallo, MD;  Location: [facility];  Service: Urology;  Laterality: N/A;  . CYSTOSCOPY/RETROGRADE/URETEROSCOPY/STONE EXTRACTION WITH BASKET  02/24/2012   Procedure: CYSTOSCOPY/RETROGRADE/URETEROSCOPY/STONE EXTRACTION WITH BASKET;  Surgeon: Franchot Gallo, MD;  Location: [facility];  Service: Urology;  Laterality: Bilateral;  1 HOUR  . GASTRECTOMY    . HEMORRHOID SURGERY    . HERNIA REPAIR  07/13/2016  . INGUINAL HERNIA REPAIR Left 07/13/2016   Procedure: REPAIR LEFT INGUINAL HERNIA;  Surgeon: Armandina Gemma, MD;  Location: [facility];  Service: General;  Laterality: Left;  . INSERTION OF MESH Left 07/13/2016   Procedure: INSERTION OF MESH;  Surgeon: Armandina Gemma, MD;  Location: [facility];  Service: General;  Laterality: Left;  Marland Kitchen MELANOMA EXCISION  05-05-2007   LEFT FOREARM  . NOSE SURGERY    . TONSILLECTOMY    . TOTAL SHOULDER ARTHROPLASTY Right 12/29/2017  . TOTAL SHOULDER ARTHROPLASTY Right 12/29/2017   Procedure: TOTAL SHOULDER ARTHROPLASTY;  Surgeon: Hiram Gash, MD;  Location: [facility];  Service: Orthopedics;  Laterality: Right;   Family History  Problem Relation Age of Onset  . Heart disease Mother        CABG in her 18s  . Pneumonia Father   . Cancer Sister         breast  . Lung cancer Brother        smoker   Social History   Socioeconomic History  . Marital status: Widowed    Spouse name: Not on file  . Number of children: 1  . Years of education: Not on file  . Highest education level: Not on file  Occupational History  . Occupation: retired  Tobacco Use  . Smoking status: Former Smoker    Packs/day: 0.10    Years: 4.00    Pack years: 0.40    Types: Cigarettes    Quit date: 09/03/1956    Years since quitting: 64.0  . Smokeless tobacco: Never Used  Vaping Use  . Vaping Use: Never used  Substance and Sexual Activity  . Alcohol use: No    Alcohol/week: 0.0 standard drinks  . Drug use: No  . Sexual activity: Yes  Other Topics Concern  . 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       Hobbies: ride motorcycle (slingshot 3 year), rabbit and dear hunt   Social Determinants of Health   Financial Resource Strain: Low Risk   . Difficulty of Paying Living Expenses: Not hard at all  Food Insecurity: No Food Insecurity  . Worried About Charity fundraiser in the Last Year: Never true  . Ran Out of Food in the Last Year: Never true  Transportation Needs: No Transportation Needs  . Lack of Transportation (Medical): No  . Lack of Transportation (Non-Medical): No  Physical Activity: Inactive  . Days of Exercise per Week: 0 days  . Minutes of Exercise per Session: 0 min  Stress: No Stress Concern Present  . Feeling of Stress : Not at all  Social Connections: Moderately Integrated  . Frequency of Communication with Friends and Family: More than three times a week  . Frequency of Social Gatherings with Friends and Family: More than three times a week  . Attends Religious Services: More than 4 times per year  . Active Member of Clubs or Organizations: Yes  . Attends English as a second language teacher Meetings: 1 to 4 times per year  . Marital Status: Widowed    Tobacco Counseling Counseling given: Not Answered   Clinical Intake:  Pre-visit preparation completed: Yes  Pain : No/denies pain     BMI - recorded: 23.49 Nutritional Status: BMI 25 -29 Overweight Nutritional Risks: None Diabetes: No  How often do you need to have someone help you when you read instructions, pamphlets, or other written materials from your doctor or pharmacy?: 1 - Never  Diabetic?No  Interpreter Needed?: No  Information entered by :: Charlott Rakes, LPN   Activities of Daily Living In your present state of health, do you have any difficulty performing the following activities: 08/29/2020  Hearing? N  Vision? N  Difficulty concentrating or making decisions? N  Walking or climbing stairs? N  Dressing or bathing? N  Doing errands, shopping? N  Preparing Food and eating ? N  Using the Toilet? N  In the past six months, have you accidently leaked urine? N  Do you have problems with loss of bowel control? N  Managing your Medications? N  Managing your Finances? N  Housekeeping or managing your Housekeeping? N  Some recent data might be hidden    Patient Care Team: Marin Olp, MD as PCP - General (Family Medicine) [place], Peter M, MD as PCP - Cardiology (Cardiology) [place], Peter M, MD as Consulting Physician (Cardiology) Almedia Balls, MD as Referring Physician (Orthopedic Surgery) Breck Coons, MD as Consulting Physician (Internal Medicine) Jari Pigg, MD as Consulting Physician (Dermatology) Franchot Gallo, MD as Consulting Physician (Urology) Hayden Pedro, MD as Consulting Physician (Ophthalmology)  Indicate any recent Medical Services you may have received from other than [facility] providers in the past year (date may be approximate).     Assessment:   This is a routine wellness examination for Joseph Simpson.  Hearing/Vision screen  Hearing Screening    125Hz  250Hz  500Hz  1000Hz  2000Hz  3000Hz  4000Hz  6000Hz  8000Hz   Right ear:           Left ear:           Comments: Pt denies any difficulty at this time  Vision Screening Comments: Dr Zigmund Daniel follows eye care annually   Dietary  issues and exercise activities discussed: Current Exercise Habits: The patient does not participate in regular exercise at present, Exercise limited by: cardiac condition(s)  Goals    . Maintain current health status.     . Patient Stated     Maintain your health !  Travel when you can     . Patient Stated     Stay healthy      Depression Screen PHQ 2/9 Scores 08/29/2020 12/21/2019 06/20/2019 06/16/2018 06/09/2018 06/02/2017 06/10/2016  PHQ - 2 Score 0 0 0 0 0 0 0    Fall Risk Fall Risk  08/29/2020 06/19/2020 06/20/2019 03/15/2019 12/13/2018  Falls in the past year? 0 0 0 0 0  Number falls in past yr: 0 0 0 0 0  Injury with Fall? 0 0 0 0 0  Follow up Falls prevention discussed - - - -    Any stairs in or around the home? Yes  If so, are there any without handrails? No  Home free of loose throw rugs in walkways, pet beds, electrical cords, etc? Yes  Adequate lighting in your home to reduce risk of falls? Yes   ASSISTIVE DEVICES UTILIZED TO PREVENT FALLS:  Life alert? No  Use of a cane, walker or w/c? Yes  Grab bars in the bathroom? Yes  Shower chair or bench in shower? No  Elevated toilet seat or a handicapped toilet? No   TIMED UP AND GO:  Was the test performed? No .      Cognitive Function: MMSE - Mini Mental State Exam 06/16/2018  Not completed: (No Data)     6CIT Screen 08/29/2020 07/28/2019  What Year? 0 points 0 points  What month? 0 points 0 points  What time? - 0 points  Count back from 20 0 points 0 points  Months in reverse 0 points 0 points  Repeat phrase 0 points 0 points  Total Score - 0    Immunizations Immunization History  Administered Date(s) Administered  . Fluad Quad(high Dose 65+) 07/28/2019, 08/13/2020  . Influenza Split  09/04/2011, 08/19/2012  . Influenza Whole 08/31/2007, 09/16/2008, 10/07/2009, 10/01/2010  . Influenza, High Dose Seasonal PF 08/25/2013, 09/24/2015, 08/27/2016, 09/01/2017, 08/23/2018  . Influenza,inj,Quad PF,6+ Mos 09/05/2014  . PFIZER SARS-COV-2 Vaccination 12/25/2019, 01/15/2020  . Pneumococcal Conjugate-13 11/23/2014  . Pneumococcal Polysaccharide-23 10/16/2002  . Td 03/04/1998, 10/09/2008  . Tdap 07/07/2019  . Zoster 04/28/2011  . Zoster Recombinat (Shingrix) 05/17/2018, 07/19/2018    TDAP status: Up to date Flu Vaccine status: Up to date Pneumococcal vaccine status: Up to date Covid-19 vaccine status: Completed vaccines  Qualifies for Shingles Vaccine? Yes   Zostavax completed Yes   Shingrix Completed?: Yes  Screening Tests Health Maintenance  Topic Date Due  . TETANUS/TDAP  07/06/2029  . INFLUENZA VACCINE  Completed  . COVID-19 Vaccine  Completed  . PNA vac Low Risk Adult  Completed    Health Maintenance  There are no preventive care reminders to display for this patient.  Colorectal cancer screening: No longer required.     Additional Screening:    Vision Screening: Recommended annual ophthalmology exams for early detection of glaucoma and other disorders of the eye. Is the patient up to date with their annual eye exam?  Yes  Who is the provider or what is the name of the office in which the patient attends annual eye exams? Dr Zigmund Daniel   Dental Screening: Recommended annual dental exams for proper oral hygiene  Community Resource Referral / Chronic Care Management:  CRR required this visit?  No   CCM required this visit?  No      Plan:     I have personally reviewed and noted the following in the patient's chart:   . Medical and social history . Use of alcohol, tobacco or illicit drugs  . Current medications and supplements . Functional ability and status . Nutritional status . Physical activity . Advanced directives . List of other  physicians . Hospitalizations, surgeries, and ER visits in previous 12 months . Vitals . Screenings to include cognitive, depression, and falls . Referrals and appointments  In addition, I have reviewed and discussed with patient certain preventive protocols, quality metrics, and best practice recommendations. A written personalized care plan for preventive services as well as general preventive health recommendations were provided to patient.     Willette Brace, LPN   [id number]   Nurse Notes: None

## 2020-08-29 NOTE — Patient Instructions (Addendum)
Joseph Simpson , Thank you for taking time to come for your Medicare Wellness Visit. I appreciate your ongoing commitment to your health goals. Please review the following plan we discussed and let me know if I can assist you in the future.   Screening recommendations/referrals: Colonoscopy: No longer required Recommended yearly ophthalmology/optometry visit for glaucoma screening and checkup Recommended yearly dental visit for hygiene and checkup  Vaccinations: Influenza vaccine: Done 08/13/20 Pneumococcal vaccine: Up to date Tdap vaccine: Up to date Shingles vaccine: Completed 05/17/18 & 07/19/18   Covid-19: Completed 2/8 & 01/15/20  Advanced directives: Please bring a copy of your health care power of attorney and living will to the office at your convenience.  Conditions/risks identified: Stay Healthy  Next appointment: Follow up in one year for your annual wellness visit.   Preventive Care 83 Years and Older, Male Preventive care refers to lifestyle choices and visits with your health care provider that can promote health and wellness. What does preventive care include?  A yearly physical exam. This is also called an annual well check.  Dental exams once or twice a year.  Routine eye exams. Ask your health care provider how often you should have your eyes checked.  Personal lifestyle choices, including:  Daily care of your teeth and gums.  Regular physical activity.  Eating a healthy diet.  Avoiding tobacco and drug use.  Limiting alcohol use.  Practicing safe sex.  Taking low doses of aspirin every day.  Taking vitamin and mineral supplements as recommended by your health care provider. What happens during an annual well check? The services and screenings done by your health care provider during your annual well check will depend on your age, overall health, lifestyle risk factors, and family history of disease. Counseling  Your health care provider may ask you questions  about your:  Alcohol use.  Tobacco use.  Drug use.  Emotional well-being.  Home and relationship well-being.  Sexual activity.  Eating habits.  History of falls.  Memory and ability to understand (cognition).  Work and work Statistician. Screening  You may have the following tests or measurements:  Height, weight, and BMI.  Blood pressure.  Lipid and cholesterol levels. These may be checked every 5 years, or more frequently if you are over 68 years old.  Skin check.  Lung cancer screening. You may have this screening every year starting at age 22 if you have a 30-pack-year history of smoking and currently smoke or have quit within the past 15 years.  Fecal occult blood test (FOBT) of the stool. You may have this test every year starting at age 13.  Flexible sigmoidoscopy or colonoscopy. You may have a sigmoidoscopy every 5 years or a colonoscopy every 10 years starting at age 36.  Prostate cancer screening. Recommendations will vary depending on your family history and other risks.  Hepatitis C blood test.  Hepatitis B blood test.  Sexually transmitted disease (STD) testing.  Diabetes screening. This is done by checking your blood sugar (glucose) after you have not eaten for a while (fasting). You may have this done every 1-3 years.  Abdominal aortic aneurysm (AAA) screening. You may need this if you are a current or former smoker.  Osteoporosis. You may be screened starting at age 61 if you are at high risk. Talk with your health care provider about your test results, treatment options, and if necessary, the need for more tests. Vaccines  Your health care provider may recommend certain vaccines, such  as:  Influenza vaccine. This is recommended every year.  Tetanus, diphtheria, and acellular pertussis (Tdap, Td) vaccine. You may need a Td booster every 10 years.  Zoster vaccine. You may need this after age 78.  Pneumococcal 13-valent conjugate (PCV13)  vaccine. One dose is recommended after age 42.  Pneumococcal polysaccharide (PPSV23) vaccine. One dose is recommended after age 37. Talk to your health care provider about which screenings and vaccines you need and how often you need them. This information is not intended to replace advice given to you by your health care provider. Make sure you discuss any questions you have with your health care provider. Document Released: 11/29/2015 Document Revised: 07/22/2016 Document Reviewed: 09/03/2015 Elsevier Interactive Patient Education  2017 [place] Prevention in the Home Falls can cause injuries. They can happen to people of all ages. There are many things you can do to make your home safe and to help prevent falls. What can I do on the outside of my home?  Regularly fix the edges of walkways and driveways and fix any cracks.  Remove anything that might make you trip as you walk through a door, such as a raised step or threshold.  Trim any bushes or trees on the path to your home.  Use bright outdoor lighting.  Clear any walking paths of anything that might make someone trip, such as rocks or tools.  Regularly check to see if handrails are loose or broken. Make sure that both sides of any steps have handrails.  Any raised decks and porches should have guardrails on the edges.  Have any leaves, snow, or ice cleared regularly.  Use sand or salt on walking paths during winter.  Clean up any spills in your garage right away. This includes oil or grease spills. What can I do in the bathroom?  Use night lights.  Install grab bars by the toilet and in the tub and shower. Do not use towel bars as grab bars.  Use non-skid mats or decals in the tub or shower.  If you need to sit down in the shower, use a plastic, non-slip stool.  Keep the floor dry. Clean up any water that spills on the floor as soon as it happens.  Remove soap buildup in the tub or shower  regularly.  Attach bath mats securely with double-sided non-slip rug tape.  Do not have throw rugs and other things on the floor that can make you trip. What can I do in the bedroom?  Use night lights.  Make sure that you have a light by your bed that is easy to reach.  Do not use any sheets or blankets that are too big for your bed. They should not hang down onto the floor.  Have a firm chair that has side arms. You can use this for support while you get dressed.  Do not have throw rugs and other things on the floor that can make you trip. What can I do in the kitchen?  Clean up any spills right away.  Avoid walking on wet floors.  Keep items that you use a lot in easy-to-reach places.  If you need to reach something above you, use a strong step stool that has a grab bar.  Keep electrical cords out of the way.  Do not use floor polish or wax that makes floors slippery. If you must use wax, use non-skid floor wax.  Do not have throw rugs and other things on the  floor that can make you trip. What can I do with my stairs?  Do not leave any items on the stairs.  Make sure that there are handrails on both sides of the stairs and use them. Fix handrails that are broken or loose. Make sure that handrails are as long as the stairways.  Check any carpeting to make sure that it is firmly attached to the stairs. Fix any carpet that is loose or worn.  Avoid having throw rugs at the top or bottom of the stairs. If you do have throw rugs, attach them to the floor with carpet tape.  Make sure that you have a light switch at the top of the stairs and the bottom of the stairs. If you do not have them, ask someone to add them for you. What else can I do to help prevent falls?  Wear shoes that:  Do not have high heels.  Have rubber bottoms.  Are comfortable and fit you well.  Are closed at the toe. Do not wear sandals.  If you use a stepladder:  Make sure that it is fully  opened. Do not climb a closed stepladder.  Make sure that both sides of the stepladder are locked into place.  Ask someone to hold it for you, if possible.  Clearly mark and make sure that you can see:  Any grab bars or handrails.  First and last steps.  Where the edge of each step is.  Use tools that help you move around (mobility aids) if they are needed. These include:  Canes.  Walkers.  Scooters.  Crutches.  Turn on the lights when you go into a dark area. Replace any light bulbs as soon as they burn out.  Set up your furniture so you have a clear path. Avoid moving your furniture around.  If any of your floors are uneven, fix them.  If there are any pets around you, be aware of where they are.  Review your medicines with your doctor. Some medicines can make you feel dizzy. This can increase your chance of falling. Ask your doctor what other things that you can do to help prevent falls. This information is not intended to replace advice given to you by your health care provider. Make sure you discuss any questions you have with your health care provider. Document Released: 08/29/2009 Document Revised: 04/09/2016 Document Reviewed: 12/07/2014 Elsevier Interactive Patient Education  2017 [employer].

## 2020-08-30 DIAGNOSIS — D225 Melanocytic nevi of trunk: Secondary | ICD-10-CM | POA: Diagnosis not present

## 2020-08-30 DIAGNOSIS — Z85828 Personal history of other malignant neoplasm of skin: Secondary | ICD-10-CM | POA: Diagnosis not present

## 2020-08-30 DIAGNOSIS — D2230 Melanocytic nevi of unspecified part of face: Secondary | ICD-10-CM | POA: Diagnosis not present

## 2020-08-30 DIAGNOSIS — L821 Other seborrheic keratosis: Secondary | ICD-10-CM | POA: Diagnosis not present

## 2020-08-30 DIAGNOSIS — D2271 Melanocytic nevi of right lower limb, including hip: Secondary | ICD-10-CM | POA: Diagnosis not present

## 2020-08-30 DIAGNOSIS — L814 Other melanin hyperpigmentation: Secondary | ICD-10-CM | POA: Diagnosis not present

## 2020-08-30 DIAGNOSIS — L578 Other skin changes due to chronic exposure to nonionizing radiation: Secondary | ICD-10-CM | POA: Diagnosis not present

## 2020-08-30 DIAGNOSIS — Z87898 Personal history of other specified conditions: Secondary | ICD-10-CM | POA: Diagnosis not present

## 2020-08-30 DIAGNOSIS — L570 Actinic keratosis: Secondary | ICD-10-CM | POA: Diagnosis not present

## 2020-08-30 DIAGNOSIS — B353 Tinea pedis: Secondary | ICD-10-CM | POA: Diagnosis not present

## 2020-08-30 DIAGNOSIS — B356 Tinea cruris: Secondary | ICD-10-CM | POA: Diagnosis not present

## 2020-09-12 ENCOUNTER — Ambulatory Visit (INDEPENDENT_AMBULATORY_CARE_PROVIDER_SITE_OTHER): Payer: Medicare Other | Admitting: *Deleted

## 2020-09-12 ENCOUNTER — Other Ambulatory Visit: Payer: Self-pay

## 2020-09-12 ENCOUNTER — Encounter: Payer: Self-pay | Admitting: *Deleted

## 2020-09-12 DIAGNOSIS — E538 Deficiency of other specified B group vitamins: Secondary | ICD-10-CM | POA: Diagnosis not present

## 2020-09-12 MED ORDER — CYANOCOBALAMIN 1000 MCG/ML IJ SOLN
1000.0000 ug | Freq: Once | INTRAMUSCULAR | Status: AC
  Administered 2020-09-12: 1000 ug via INTRAMUSCULAR

## 2020-09-12 NOTE — Progress Notes (Signed)
Per orders of Dr. Yong Channel, injection of Cyanocobalamin 1000 mcg/1 ml given by Anselmo Pickler, LPN in left deltoid. Patient tolerated injection well. Patient will make appointment for 1 month

## 2020-09-17 ENCOUNTER — Other Ambulatory Visit: Payer: Self-pay

## 2020-09-17 ENCOUNTER — Encounter (INDEPENDENT_AMBULATORY_CARE_PROVIDER_SITE_OTHER): Payer: Medicare Other | Admitting: Ophthalmology

## 2020-09-17 DIAGNOSIS — H43813 Vitreous degeneration, bilateral: Secondary | ICD-10-CM

## 2020-09-17 DIAGNOSIS — H35033 Hypertensive retinopathy, bilateral: Secondary | ICD-10-CM | POA: Diagnosis not present

## 2020-09-17 DIAGNOSIS — I10 Essential (primary) hypertension: Secondary | ICD-10-CM | POA: Diagnosis not present

## 2020-09-17 DIAGNOSIS — D3132 Benign neoplasm of left choroid: Secondary | ICD-10-CM

## 2020-09-17 DIAGNOSIS — H353231 Exudative age-related macular degeneration, bilateral, with active choroidal neovascularization: Secondary | ICD-10-CM | POA: Diagnosis not present

## 2020-09-24 ENCOUNTER — Other Ambulatory Visit: Payer: Self-pay | Admitting: Family Medicine

## 2020-09-24 DIAGNOSIS — Z20822 Contact with and (suspected) exposure to covid-19: Secondary | ICD-10-CM | POA: Diagnosis not present

## 2020-10-04 DIAGNOSIS — Z23 Encounter for immunization: Secondary | ICD-10-CM | POA: Diagnosis not present

## 2020-10-08 ENCOUNTER — Telehealth: Payer: Self-pay

## 2020-10-08 NOTE — Telephone Encounter (Signed)
I can clear him medically without visit I believe-cardiology will need to clear from cardiology perspective

## 2020-10-08 NOTE — Telephone Encounter (Signed)
See below

## 2020-10-08 NOTE — Telephone Encounter (Signed)
Pt is planning to get surgery at [facility] with Dr. Mardelle Matte. Pt was told that he needs to get cleared for surgery by Dr. Yong Channel. Does pt need an appointment? If so when can we work him in? Pt has an appointment with Dr. Mardelle Matte to discuss appt on 12/2. Advised pt to have surgical clearance form faxed over to our office. Please advise.

## 2020-10-08 NOTE — Telephone Encounter (Signed)
error 

## 2020-10-08 NOTE — Telephone Encounter (Signed)
Called and spoke with pt and gave below message, he will have paper faxed. [place] can you be on the lookout for this paper please?

## 2020-10-08 NOTE — Telephone Encounter (Signed)
Noted  

## 2020-10-15 ENCOUNTER — Ambulatory Visit: Payer: Medicare Other

## 2020-10-15 ENCOUNTER — Encounter (INDEPENDENT_AMBULATORY_CARE_PROVIDER_SITE_OTHER): Payer: Medicare Other | Admitting: Ophthalmology

## 2020-10-15 ENCOUNTER — Other Ambulatory Visit: Payer: Self-pay

## 2020-10-15 DIAGNOSIS — H33301 Unspecified retinal break, right eye: Secondary | ICD-10-CM

## 2020-10-15 DIAGNOSIS — H43813 Vitreous degeneration, bilateral: Secondary | ICD-10-CM | POA: Diagnosis not present

## 2020-10-15 DIAGNOSIS — I10 Essential (primary) hypertension: Secondary | ICD-10-CM | POA: Diagnosis not present

## 2020-10-15 DIAGNOSIS — H35033 Hypertensive retinopathy, bilateral: Secondary | ICD-10-CM

## 2020-10-15 DIAGNOSIS — D3132 Benign neoplasm of left choroid: Secondary | ICD-10-CM | POA: Diagnosis not present

## 2020-10-15 DIAGNOSIS — H353231 Exudative age-related macular degeneration, bilateral, with active choroidal neovascularization: Secondary | ICD-10-CM | POA: Diagnosis not present

## 2020-10-16 ENCOUNTER — Ambulatory Visit (INDEPENDENT_AMBULATORY_CARE_PROVIDER_SITE_OTHER): Payer: Medicare Other

## 2020-10-16 DIAGNOSIS — E538 Deficiency of other specified B group vitamins: Secondary | ICD-10-CM

## 2020-10-16 MED ORDER — CYANOCOBALAMIN 1000 MCG/ML IJ SOLN
1000.0000 ug | Freq: Once | INTRAMUSCULAR | Status: AC
  Administered 2020-10-16: 1000 ug via INTRAMUSCULAR

## 2020-10-16 NOTE — Progress Notes (Signed)
I have reviewed and agree with note, evaluation, plan.   Kenise Barraco, MD  

## 2020-10-16 NOTE — Progress Notes (Signed)
Norton Pastel 85 y.o. male presents to office today for monthly B12 injection per Garret Reddish, MD. Administered CYANOCOBALAMIN 1,000 mcg IM right arm. Patient tolerated well.

## 2020-10-18 DIAGNOSIS — M1612 Unilateral primary osteoarthritis, left hip: Secondary | ICD-10-CM | POA: Diagnosis not present

## 2020-10-21 ENCOUNTER — Telehealth: Payer: Self-pay | Admitting: *Deleted

## 2020-10-21 NOTE — Telephone Encounter (Signed)
   [facility] Pre-operative Risk Assessment    HEARTCARE STAFF: - Please ensure there is not already an duplicate clearance open for this procedure. - Under Visit Info/Reason for Call, type in Other and utilize the format Clearance MM/DD/YY or Clearance TBD. Do not use dashes or single digits. - If request is for dental extraction, please clarify the # of teeth to be extracted.  Request for surgical clearance:  1. What type of surgery is being performed? Left total hip athroplasty   2. When is this surgery scheduled? TBD  3. What type of clearance is required (medical clearance vs. Pharmacy clearance to hold med vs. Both)? medical  4. Are there any medications that need to be held prior to surgery and how long?none  5. Practice name and name of physician performing surgery? [facility]  6. What is the office phone number? [phone] x [phone]   7.   What is the office fax number? [phone] attn sherri  8.   Anesthesia type (None, local, MAC, general) ? Not listed   Joseph Simpson 10/21/2020, 5:18 PM  _________________________________________________________________   (provider comments below)

## 2020-10-22 NOTE — Telephone Encounter (Signed)
Pt has been scheduled to see Kerin Ransom, PA-C 10/28/20 and surgery clearance will be addressed at that time.  Will route back to the requesting surgeon's office to make them aware.

## 2020-10-22 NOTE — Telephone Encounter (Signed)
   Primary Cardiologist: Peter Martinique, MD  Chart reviewed as part of pre-operative protocol coverage. Because of RANGEL ECHEVERRI past medical history and time since last visit, he will require a follow-up visit in order to better assess preoperative cardiovascular risk.  Pre-op covering staff: - Please schedule appointment and call patient to inform them. If patient already had an upcoming appointment within acceptable timeframe, please add "pre-op clearance" to the appointment notes so provider is aware. - Please contact requesting surgeon's office via preferred method (i.e, phone, fax) to inform them of need for appointment prior to surgery.  If applicable, this message will also be routed to pharmacy pool and/or primary cardiologist for input on holding anticoagulant/antiplatelet agent as requested below so that this information is available to the clearing provider at time of patient's appointment.   Kerin Ransom, PA-C  10/22/2020, 8:24 AM

## 2020-10-28 ENCOUNTER — Other Ambulatory Visit: Payer: Self-pay

## 2020-10-28 ENCOUNTER — Ambulatory Visit (INDEPENDENT_AMBULATORY_CARE_PROVIDER_SITE_OTHER): Payer: Medicare Other | Admitting: Cardiology

## 2020-10-28 ENCOUNTER — Encounter: Payer: Self-pay | Admitting: Cardiology

## 2020-10-28 VITALS — BP 144/62 | HR 53 | Ht 73.0 in | Wt 181.2 lb

## 2020-10-28 DIAGNOSIS — Z01818 Encounter for other preprocedural examination: Secondary | ICD-10-CM | POA: Insufficient documentation

## 2020-10-28 DIAGNOSIS — I10 Essential (primary) hypertension: Secondary | ICD-10-CM | POA: Diagnosis not present

## 2020-10-28 DIAGNOSIS — R9439 Abnormal result of other cardiovascular function study: Secondary | ICD-10-CM

## 2020-10-28 DIAGNOSIS — G4733 Obstructive sleep apnea (adult) (pediatric): Secondary | ICD-10-CM

## 2020-10-28 DIAGNOSIS — R002 Palpitations: Secondary | ICD-10-CM | POA: Diagnosis not present

## 2020-10-28 DIAGNOSIS — I2510 Atherosclerotic heart disease of native coronary artery without angina pectoris: Secondary | ICD-10-CM | POA: Diagnosis not present

## 2020-10-28 NOTE — Progress Notes (Signed)
Cardiology Office Note:    Date:  10/28/2020   ID:  Joseph Simpson, DOB 1935/06/14, MRN [id number]  PCP:  Marin Olp, MD  Cardiologist:  Peter Martinique, MD  Electrophysiologist:  None   Referring MD: Marin Olp, MD   No chief complaint on file. pre op clearance prior to hip surgery  History of Present Illness:    Joseph Simpson is a pleasant 85 y.o. male with a hx of coronary disease, HTN,, OSA not on C-pap, and PVCs.  He had an abnormal stress test in 2013.  Subsequent catheterization demonstrated nonobstructive coronary disease.  He is done well from a cardiac standpoint.  He saw Dr. Martinique in May 2021.  He is in the office today for preop clearance prior to left hip replacement.  Since we saw him last the patient denies any chest pain or unusual shortness of breath.  He is bradycardic at rest with a heart rate of 53 but he denies any syncope or near syncope.  Past Medical History:  Diagnosis Date  . Abnormal stress test December 2013   s/p cardiac cath with minimal nonobstructive CAD and normal LV function; medical management recommended  . Arthritis   . CAD (coronary artery disease)   . Dyslipidemia   . Dysrhythmia   . GERD (gastroesophageal reflux disease)   . History of hiatal hernia   . History of kidney stones   . Hypertension   . Macular degeneration   . Melanoma ([place])   . Obstructive sleep apnea 09/23/2007   Wore cpap previously. Stopped and not interested despite risks.     . OSA (obstructive sleep apnea)   . PAC (premature atrial contraction) 10/25/2015  . PVC's (premature ventricular contractions) 09/27/2013  . SVT (supraventricular tachycardia) ([place])   . Syncope     Past Surgical History:  Procedure Laterality Date  . APPENDECTOMY    . CARDIAC CATHETERIZATION  December 2013   minimal nonobstructive CAD with normal LV function  . CATARACT EXTRACTION    . CYSTOSCOPY WITH LITHOLAPAXY N/A 07/13/2016   Procedure: CYSTOSCOPY WITH LITHOLAPAXY;  Surgeon:  Franchot Gallo, MD;  Location: [facility];  Service: Urology;  Laterality: N/A;  . CYSTOSCOPY/RETROGRADE/URETEROSCOPY/STONE EXTRACTION WITH BASKET  02/24/2012   Procedure: CYSTOSCOPY/RETROGRADE/URETEROSCOPY/STONE EXTRACTION WITH BASKET;  Surgeon: Franchot Gallo, MD;  [facility];  Service: Urology;  Laterality: Bilateral;  1 HOUR  . GASTRECTOMY    . HEMORRHOID SURGERY    . HERNIA REPAIR  07/13/2016  . INGUINAL HERNIA REPAIR Left 07/13/2016   Procedure: REPAIR LEFT INGUINAL HERNIA;  Surgeon: Armandina Gemma, MD;  Location: [facility];  Service: General;  Laterality: Left;  . INSERTION OF MESH Left 07/13/2016   Procedure: INSERTION OF MESH;  Surgeon: Armandina Gemma, MD;  Location: [facility];  Service: General;  Laterality: Left;  Marland Kitchen MELANOMA EXCISION  05-05-2007   LEFT FOREARM  . NOSE SURGERY    . TONSILLECTOMY    . TOTAL SHOULDER ARTHROPLASTY Right 12/29/2017  . TOTAL SHOULDER ARTHROPLASTY Right 12/29/2017   Procedure: TOTAL SHOULDER ARTHROPLASTY;  Surgeon: Hiram Gash, MD;  Location: [facility];  Service: Orthopedics;  Laterality: Right;    Current Medications: Current Meds  Medication Sig  . albuterol (PROAIR HFA) 108 (90 Base) MCG/ACT inhaler Inhale 2 puffs into the lungs every 6 (six) hours as needed for wheezing or shortness of breath.  Marland Kitchen atorvastatin (LIPITOR) 20 MG tablet Take 20 mg three times a week  . bevacizumab (AVASTIN) 2.5 mg/0.1  mL SOLN 2.5 mg by Intravitreal route. Injection to eye every 8 weeks - use with vigamox  . Cyanocobalamin (B-12 COMPLIANCE INJECTION IJ) Inject as directed. Once a month  . fluticasone (FLONASE) 50 MCG/ACT nasal spray Place 2 sprays into both nostrils daily. (Patient taking differently: Place 2 sprays into both nostrils as needed for allergies.)  . ketoconazole (NIZORAL) 2 % cream Apply topically.  Marland Kitchen losartan (COZAAR) 100 MG tablet TAKE A HALF TABLET BY MOUTH TWICE A DAY  . metoCLOPramide (REGLAN) 10 MG tablet Take 5 mg by mouth 2 (two) times  daily.  . metoprolol tartrate (LOPRESSOR) 25 MG tablet TAKE ONE TABLET BY MOUTH TWICE DAILY  . pantoprazole (PROTONIX) 40 MG tablet Take 40 mg by mouth 2 (two) times daily.  [name] Glycol-Propyl Glycol (SYSTANE ULTRA) 0.4-0.3 % SOLN Place 1 drop into both eyes 3 (three) times daily.  . tamsulosin (FLOMAX) 0.4 MG CAPS capsule Take 0.4 mg daily as needed  . VIGAMOX 0.5 % ophthalmic solution Apply 1 drop to eye as directed. Take every 8 weeks as directed - use with avastin     Allergies:   Aspirin, Percocet [oxycodone-acetaminophen], and Vitamins [apatate]   Social History   Socioeconomic History  . Marital status: Widowed    Spouse name: Not on file  . Number of children: 1  . Years of education: Not on file  . Highest education level: Not on file  Occupational History  . Occupation: retired  Tobacco Use  . Smoking status: Former Smoker    Packs/day: 0.10    Years: 4.00    Pack years: 0.40    Types: Cigarettes    Quit date: 09/03/1956    Years since quitting: 64.1  . Smokeless tobacco: Never Used  Vaping Use  . Vaping Use: Never used  Substance and Sexual Activity  . Alcohol use: No    Alcohol/week: 0.0 standard drinks  . Drug use: No  . Sexual activity: Yes  Other Topics Concern  . 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       Hobbies: ride motorcycle (slingshot 3 year), rabbit and dear hunt   Social Determinants of Health   Financial Resource Strain: Low Risk   . Difficulty of Paying Living Expenses: Not hard at all  Food Insecurity: No Food Insecurity  . Worried About Charity fundraiser in the Last Year: Never true  . Ran Out of Food in the Last Year: Never true  Transportation Needs: No Transportation Needs  . Lack of Transportation (Medical): No  . Lack of Transportation (Non-Medical):  No  Physical Activity: Inactive  . Days of Exercise per Week: 0 days  . Minutes of Exercise per Session: 0 min  Stress: No Stress Concern Present  . Feeling of Stress : Not at all  Social Connections: Moderately Integrated  . Frequency of Communication with Friends and Family: More than three times a week  . Frequency of Social Gatherings with Friends and Family: More than three times a week  . Attends Religious Services: More than 4 times per year  . Active Member of Clubs or Organizations: Yes  . Attends Archivist Meetings: 1 to 4 times per year  . Marital Status: Widowed     Family History: The patient's family history  includes Cancer in his sister; Heart disease in his mother; Lung cancer in his brother; Pneumonia in his father.  ROS:   Please see the history of present illness.     All other systems reviewed and are negative.  EKGs/Labs/Other Studies Reviewed:    The following studies were reviewed today:   EKG:  EKG is ordered today.  The ekg ordered today demonstrates NSR, SB-53   Recent Labs: 12/21/2019: Hemoglobin 16.6; Platelets 158.0 06/19/2020: ALT 19; BUN 10; Creat 0.80; Potassium 4.1; Sodium 140  Recent Lipid Panel    Component Value Date/Time   CHOL 155 12/21/2019 1006   TRIG 150.0 (H) 12/21/2019 1006   HDL 37.40 (L) 12/21/2019 1006   CHOLHDL 4 12/21/2019 1006   VLDL 30.0 12/21/2019 1006   LDLCALC 87 12/21/2019 1006   LDLDIRECT 83 06/19/2020 1033    Physical Exam:    VS:  BP (!) 144/62   Pulse (!) 53   Ht 6\' 1"  (1.854 m)   Wt 181 lb 3.2 oz (82.2 kg)   BMI 23.91 kg/m     Wt Readings from Last 3 Encounters:  10/28/20 181 lb 3.2 oz (82.2 kg)  06/19/20 178 lb (80.7 kg)  04/04/20 182 lb 9.6 oz (82.8 kg)     GEN: Well nourished, thin Caucasian male,  well developed in no acute distress HEENT: Normal NECK: No JVD; No carotid bruits LYMPHATICS: No lymphadenopathy CARDIAC: RRR, 2-5/8 systolic murmur LSB, no rubs, gallops RESPIRATORY:  Clear  to auscultation without rales, wheezing or rhonchi  ABDOMEN: Soft, non-tender, non-distended MUSCULOSKELETAL:  No edema; No deformity  SKIN: Warm and dry NEUROLOGIC:  Alert and oriented x 3 PSYCHIATRIC:  Normal affect   ASSESSMENT:    Pre-operative clearance Pt is an acceptable risk for the proposed procedure without further cardiac work up.  Abnormal stress test 2013- s/p cardiac cath with minimal nonobstructive CAD and normal LV function; medical management recommended  Essential hypertension Controlled  Palpitations Dr. Martinique 09/2014 SVT/bradycardia/PVCs- Normal EF by cath and myoview. Symptoms improved with metoprolol. Continue current therapy. HR 53 on beta blocker but patient denies syncope or pre syncope  PLAN:    I will fax clearance to operating surgeon.  F/U Dr Martinique in March 2022.   Medication Adjustments/Labs and Tests Ordered: Current medicines are reviewed at length with the patient today.  Concerns regarding medicines are outlined above.  Orders Placed This Encounter  Procedures  . EKG 12-Lead   No orders of the defined types were placed in this encounter.   Patient Instructions  Medication Instructions:  Continue current medications  *If you need a refill on your cardiac medications before your next appointment, please call your pharmacy*   Lab Work: None ordered   Testing/Procedures: None Ordered   Follow-Up: At [facility], you and your health needs are our priority.  As part of our continuing mission to provide you with exceptional heart care, we have created designated Provider Care Teams.  These Care Teams include your primary Cardiologist (physician) and Advanced Practice Providers (APPs -  Physician Assistants and Nurse Practitioners) who all work together to provide you with the care you need, when you need it.  We recommend signing up for the patient portal called "MyChart".  Sign up information is provided on this After Visit  Summary.  MyChart is used to connect with patients for Virtual Visits (Telemedicine).  Patients are able to view lab/test results, encounter notes, upcoming appointments, etc.  Non-urgent messages can be sent to  your provider as well.   To learn more about what you can do with MyChart, go to [email or web address].    Your next appointment:   4 month(s)  The format for your next appointment:   In Person  Provider:   You may see Peter Martinique, MD or one of the following Advanced Practice Providers on your designated Care Team:    Kerin Ransom, PA-C  [place], [place]  Coletta Memos, FNP        Signed, Kerin Ransom, [place]  10/28/2020 3:24 PM    [place]

## 2020-10-28 NOTE — Assessment & Plan Note (Signed)
2013- s/p cardiac cath with minimal nonobstructive CAD and normal LV function; medical management recommended

## 2020-10-28 NOTE — Patient Instructions (Signed)
Medication Instructions:  Continue current medications  *If you need a refill on your cardiac medications before your next appointment, please call your pharmacy*   Lab Work: None ordered   Testing/Procedures: None Ordered   Follow-Up: At [facility], you and your health needs are our priority.  As part of our continuing mission to provide you with exceptional heart care, we have created designated Provider Care Teams.  These Care Teams include your primary Cardiologist (physician) and Advanced Practice Providers (APPs -  Physician Assistants and Nurse Practitioners) who all work together to provide you with the care you need, when you need it.  We recommend signing up for the patient portal called "MyChart".  Sign up information is provided on this After Visit Summary.  MyChart is used to connect with patients for Virtual Visits (Telemedicine).  Patients are able to view lab/test results, encounter notes, upcoming appointments, etc.  Non-urgent messages can be sent to your provider as well.   To learn more about what you can do with MyChart, go to [email or web address].    Your next appointment:   4 month(s)  The format for your next appointment:   In Person  Provider:   You may see Peter Martinique, MD or one of the following Advanced Practice Providers on your designated Care Team:    Kerin Ransom, PA-C  [place], [place]  Coletta Memos, [place]

## 2020-10-28 NOTE — Assessment & Plan Note (Signed)
Controlled.  

## 2020-10-28 NOTE — Assessment & Plan Note (Signed)
Pt is an acceptable risk for the proposed procedure without further cardiac work up.  

## 2020-10-28 NOTE — Assessment & Plan Note (Signed)
Dr. Martinique 09/2014 SVT/bradycardia/PVCs- Normal EF by cath and myoview. Symptoms improved with metoprolol. Continue current therapy. HR 53 on beta blocker but patient denies syncope or pre syncope

## 2020-10-30 DIAGNOSIS — N210 Calculus in bladder: Secondary | ICD-10-CM | POA: Diagnosis not present

## 2020-10-30 DIAGNOSIS — N401 Enlarged prostate with lower urinary tract symptoms: Secondary | ICD-10-CM | POA: Diagnosis not present

## 2020-10-30 DIAGNOSIS — R350 Frequency of micturition: Secondary | ICD-10-CM | POA: Diagnosis not present

## 2020-11-19 ENCOUNTER — Encounter (INDEPENDENT_AMBULATORY_CARE_PROVIDER_SITE_OTHER): Payer: Medicare Other | Admitting: Ophthalmology

## 2020-11-19 ENCOUNTER — Other Ambulatory Visit: Payer: Self-pay

## 2020-11-19 DIAGNOSIS — H35033 Hypertensive retinopathy, bilateral: Secondary | ICD-10-CM | POA: Diagnosis not present

## 2020-11-19 DIAGNOSIS — H43813 Vitreous degeneration, bilateral: Secondary | ICD-10-CM | POA: Diagnosis not present

## 2020-11-19 DIAGNOSIS — I10 Essential (primary) hypertension: Secondary | ICD-10-CM | POA: Diagnosis not present

## 2020-11-19 DIAGNOSIS — H353231 Exudative age-related macular degeneration, bilateral, with active choroidal neovascularization: Secondary | ICD-10-CM

## 2020-11-19 DIAGNOSIS — H33301 Unspecified retinal break, right eye: Secondary | ICD-10-CM

## 2020-11-19 DIAGNOSIS — D3132 Benign neoplasm of left choroid: Secondary | ICD-10-CM | POA: Diagnosis not present

## 2020-11-20 ENCOUNTER — Ambulatory Visit (INDEPENDENT_AMBULATORY_CARE_PROVIDER_SITE_OTHER): Payer: Medicare Other | Admitting: *Deleted

## 2020-11-20 DIAGNOSIS — E538 Deficiency of other specified B group vitamins: Secondary | ICD-10-CM | POA: Diagnosis not present

## 2020-11-20 MED ORDER — CYANOCOBALAMIN 1000 MCG/ML IJ SOLN
1000.0000 ug | Freq: Once | INTRAMUSCULAR | Status: AC
  Administered 2020-11-20: 1000 ug via INTRAMUSCULAR

## 2020-11-20 NOTE — Progress Notes (Signed)
Joseph Simpson 86 y.o. male at the office today for his monthly B12 injection per Tana Conch, MD. Allergies reviewed; no concerns today.  Administered CYANOCOBALAMIN 1,000 mcg IM left arm. Patient tolerated well.  Next office appt 12/23/20

## 2020-11-29 DIAGNOSIS — L570 Actinic keratosis: Secondary | ICD-10-CM | POA: Diagnosis not present

## 2020-12-19 ENCOUNTER — Other Ambulatory Visit: Payer: Self-pay | Admitting: Family Medicine

## 2020-12-21 NOTE — Progress Notes (Signed)
Phone [phone] In person visit   Subjective:   Joseph Simpson is a 86 y.o. year old very pleasant male patient who presents for/with See problem oriented charting Chief Complaint  Patient presents with  . Hyperlipidemia  . Hypertension  . Gastroesophageal Reflux  . Injections    B12    This visit occurred during the SARS-CoV-2 public health emergency.  Safety protocols were in place, including screening questions prior to the visit, additional usage of staff PPE, and extensive cleaning of exam room while observing appropriate contact time as indicated for disinfecting solutions.   Past Medical History-  Patient Active Problem List   Diagnosis Date Noted  . CAD (coronary artery disease)     Priority: High  . Macular degeneration 11/17/2011    Priority: High  . Hyperglycemia 12/21/2019    Priority: Medium  . B12 deficiency 03/09/2019    Priority: Medium  . Hyperlipidemia 06/10/2016    Priority: Medium  . BPH (benign prostatic hyperplasia) 11/23/2014    Priority: Medium  . Palpitations 07/31/2014    Priority: Medium  . Dyspnea 02/27/2014    Priority: Medium  . History of melanoma- arm 09/26/2007    Priority: Medium  . Essential hypertension 09/23/2007    Priority: Medium  . GERD 09/23/2007    Priority: Medium  . Osteoarthritis of left hip 01/30/2019    Priority: Low  . 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 12/29/2017    Priority: Low  . History of hiatal hernia     Priority: Low  . PAC (premature atrial contraction) 10/25/2015    Priority: Low  . 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 Cough 05/10/2014    Priority: Low  . Pulmonary nodule 05/10/2014    Priority: Low  . PVC's (premature ventricular contractions) 09/27/2013    Priority: Low  . Abnormal stress test 10/16/2012    Priority: Low  . Murmur 02/10/2012    Priority: Low  . Nephrolithiasis 09/04/2011    Priority: Low  . Obstructive sleep apnea  09/23/2007    Priority: Low  . Pre-operative clearance 10/28/2020  . Syncope   . Gastroparesis 02/15/2017  . Hx of adenomatous colonic polyps 02/15/2017  . Left inguinal hernia 07/12/2016    Medications- reviewed and updated Current Outpatient Medications  Medication Sig Dispense Refill  . albuterol (PROAIR HFA) 108 (90 Base) MCG/ACT inhaler Inhale 2 puffs into the lungs every 6 (six) hours as needed for wheezing or shortness of breath. 1 Inhaler 3  . atorvastatin (LIPITOR) 20 MG tablet Take 20 mg three times a week 90 tablet 3  . bevacizumab (AVASTIN) 2.5 mg/0.1 mL SOLN 2.5 mg by Intravitreal route. Injection to eye every 8 weeks - use with vigamox    . Cyanocobalamin (B-12 COMPLIANCE INJECTION IJ) Inject as directed. Once a month    . fluticasone (FLONASE) 50 MCG/ACT nasal spray Place 2 sprays into both nostrils daily. (Patient taking differently: Place 2 sprays into both nostrils as needed for allergies.) 16 g 3  . ketoconazole (NIZORAL) 2 % cream Apply topically.    Marland Kitchen losartan (COZAAR) 100 MG tablet TAKE HALF TABLET BY MOUTH TWICE DAILY 90 tablet 0  . metoCLOPramide (REGLAN) 10 MG tablet Take 5 mg by mouth 2 (two) times daily.    . metoprolol tartrate (LOPRESSOR) 25 MG tablet TAKE ONE TABLET BY MOUTH TWICE DAILY 180 tablet 2  . pantoprazole (PROTONIX) 40 MG tablet Take 40 mg by mouth 2 (two)  times daily.    [name] Glycol-Propyl Glycol (SYSTANE ULTRA) 0.4-0.3 % SOLN Place 1 drop into both eyes 3 (three) times daily.    . tamsulosin (FLOMAX) 0.4 MG CAPS capsule Take 0.4 mg daily as needed 30 capsule   . VIGAMOX 0.5 % ophthalmic solution Apply 1 drop to eye as directed. Take every 8 weeks as directed - use with avastin     No current facility-administered medications for this visit.     Objective:  BP 138/78   Pulse 60   Temp 98.3 F (36.8 C) (Temporal)   Ht 6\' 1"  (1.854 m)   Wt 180 lb 6.4 oz (81.8 kg)   SpO2 97%   BMI 23.80 kg/m  Gen: NAD, resting comfortably CV: RRR no  murmurs rubs or gallops Lungs: CTAB no crackles, wheeze, rhonchi Ext: no edema Skin: warm, dry    Assessment and Plan    #Left hip replacement coming up with Dr. Mardelle Matte- already cleared by Dr. Martinique- trying to get scheduled  #Hypertension  S: Controlled on losartan 50 mg twice daily, metoprolol 25 mg twice daily (also helps palpitations)  History of orthostatic hypotension-previously also on amlodipine 2.5 mg  Monitors his home blood pressures- 133/60 yesterday  BP Readings from Last 3 Encounters:  12/25/20 138/78  10/28/20 (!) 144/62  06/19/20 120/74  A/P: Stable. Continue current medications. Orthostatic hitsory so im ok riding high normal on BP     #Hyperlipidemia/CAD S: Patient has had minimally obstructive CAD on cath 2013.  Cannot use aspirin with prior one third removal of stomach-follows with Dr. Martinique . No chest pain or shortness of breath  Currently listed as atorvastatin 40 mg tablet-half tablet three times a week (dizzy on atorvastatin 40mg  weekly) -we used atorvastatin 10 mg daily around the time of his shoulder surgery but he has declined otherwise.  LDL goal at least under 100 but would prefer under 70-he is above the goal on last check Lab Results  Component Value Date   CHOL 155 12/21/2019   HDL 37.40 (L) 12/21/2019   LDLCALC 87 12/21/2019   LDLDIRECT 83 06/19/2020   TRIG 150.0 (H) 12/21/2019   CHOLHDL 4 12/21/2019  A/P: update lipid panel today- hoping LDL under 70 ideally but if between 70-100 may leave medicine alone at same dose.     %GERD- follows with Dr. Erlene Quan in [place] S: Patient is compliant with high-dose PPI-40 mg twice a day per Dr. Erlene Quan of GI- now retired and will see new provider.  Also on Reglan.  b12 deficiency in past- highdose PPI likely causes- current on b12 injections monthly Lab Results  Component Value Date   VITAMINB12 474 06/20/2019  A/P: GERD well controlled- on high dose PPI through GI.   b12 injection today- continue to  monitor-  %BPH- Dr. Diona Fanti urology S: Patient remains on tamsulosin 0.4 mg daily as needed through urology- seen in december A/P: sparing tamsulosin effective- may continue this  # Hyperglycemia/insulin resistance/prediabetes- peak a1c 6.1 S:  Medication: none Exercise and diet- exercise limited by hip, eating healthy/maintaining weight   A/P: update a1c today to monitor prediabetes  Recommended follow up: Return in about 6 months (around 06/24/2021). Future Appointments  Date Time Provider [place]  01/28/2021  7:30 AM Hayden Pedro, MD [facility] None  02/26/2021 10:20 AM Martinique, Peter M, MD [facility]  09/04/2021  8:45 AM [facility] HEALTH COACH [facility]    Lab/Order associations:   ICD-10-CM   1. Essential hypertension  I10   2. Gastroesophageal reflux disease without esophagitis  K21.9   3. Hyperlipidemia, unspecified hyperlipidemia type  E78.5 CBC with Differential/Platelet    Comprehensive metabolic panel    Lipid panel  4. Hyperglycemia  R73.9 Hemoglobin A1c  5. B12 deficiency  E53.8 cyanocobalamin ((VITAMIN B-12)) injection 1,000 mcg    Vitamin B12    Meds ordered this encounter  Medications  . cyanocobalamin ((VITAMIN B-12)) injection 1,000 mcg    Return precautions advised.  Garret Reddish, MD

## 2020-12-21 NOTE — Patient Instructions (Addendum)
  Health Maintenance Due  Topic Date Due  . COVID-19 Vaccine (3 - Booster for [employer] series) will call with the last date.  07/17/2020   Best of luck with your upcoming surgery  Please stop by lab before you go If you have mychart- we will send your results within 3 business days of [place] receiving them.  If you do not have mychart- we will call you about results within 5 business days of [place] receiving them.  *please also note that you will see labs on mychart as soon as they post. I will later go in and write notes on them- will say "notes from Dr. Yong Channel"  Recommended follow up: Return in about 6 months (around 06/24/2021).

## 2020-12-23 ENCOUNTER — Ambulatory Visit: Payer: Medicare Other | Admitting: Family Medicine

## 2020-12-24 ENCOUNTER — Other Ambulatory Visit: Payer: Self-pay

## 2020-12-24 ENCOUNTER — Encounter (INDEPENDENT_AMBULATORY_CARE_PROVIDER_SITE_OTHER): Payer: Medicare Other | Admitting: Ophthalmology

## 2020-12-24 DIAGNOSIS — H33301 Unspecified retinal break, right eye: Secondary | ICD-10-CM

## 2020-12-24 DIAGNOSIS — H35033 Hypertensive retinopathy, bilateral: Secondary | ICD-10-CM | POA: Diagnosis not present

## 2020-12-24 DIAGNOSIS — H353231 Exudative age-related macular degeneration, bilateral, with active choroidal neovascularization: Secondary | ICD-10-CM

## 2020-12-24 DIAGNOSIS — H43813 Vitreous degeneration, bilateral: Secondary | ICD-10-CM | POA: Diagnosis not present

## 2020-12-24 DIAGNOSIS — D3132 Benign neoplasm of left choroid: Secondary | ICD-10-CM

## 2020-12-24 DIAGNOSIS — I10 Essential (primary) hypertension: Secondary | ICD-10-CM

## 2020-12-25 ENCOUNTER — Telehealth: Payer: Self-pay

## 2020-12-25 ENCOUNTER — Ambulatory Visit (INDEPENDENT_AMBULATORY_CARE_PROVIDER_SITE_OTHER): Payer: Medicare Other | Admitting: Family Medicine

## 2020-12-25 ENCOUNTER — Encounter: Payer: Self-pay | Admitting: Family Medicine

## 2020-12-25 ENCOUNTER — Other Ambulatory Visit: Payer: Self-pay

## 2020-12-25 VITALS — BP 138/78 | HR 60 | Temp 98.3°F | Ht 73.0 in | Wt 180.4 lb

## 2020-12-25 DIAGNOSIS — E785 Hyperlipidemia, unspecified: Secondary | ICD-10-CM

## 2020-12-25 DIAGNOSIS — K219 Gastro-esophageal reflux disease without esophagitis: Secondary | ICD-10-CM | POA: Diagnosis not present

## 2020-12-25 DIAGNOSIS — I10 Essential (primary) hypertension: Secondary | ICD-10-CM

## 2020-12-25 DIAGNOSIS — R739 Hyperglycemia, unspecified: Secondary | ICD-10-CM | POA: Diagnosis not present

## 2020-12-25 DIAGNOSIS — E538 Deficiency of other specified B group vitamins: Secondary | ICD-10-CM

## 2020-12-25 LAB — COMPREHENSIVE METABOLIC PANEL
ALT: 12 U/L (ref 0–53)
AST: 17 U/L (ref 0–37)
Albumin: 4.5 g/dL (ref 3.5–5.2)
Alkaline Phosphatase: 67 U/L (ref 39–117)
BUN: 13 mg/dL (ref 6–23)
CO2: 32 mEq/L (ref 19–32)
Calcium: 9.8 mg/dL (ref 8.4–10.5)
Chloride: 103 mEq/L (ref 96–112)
Creatinine, Ser: 0.81 mg/dL (ref 0.40–1.50)
GFR: 81.2 mL/min (ref >60.00)
Glucose, Bld: 103 mg/dL — ABNORMAL HIGH (ref 70–99)
Potassium: 3.9 mEq/L (ref 3.5–5.1)
Sodium: 141 mEq/L (ref 135–145)
Total Bilirubin: 1.2 mg/dL (ref 0.2–1.2)
Total Protein: 7.4 g/dL (ref 6.0–8.3)

## 2020-12-25 LAB — CBC WITH DIFFERENTIAL/PLATELET
Basophils Absolute: 0 10*3/uL (ref 0.0–0.1)
Basophils Relative: 0.6 % (ref 0.0–3.0)
Eosinophils Absolute: 0.1 10*3/uL (ref 0.0–0.7)
Eosinophils Relative: 2.3 % (ref 0.0–5.0)
HCT: 45.3 % (ref 39.0–52.0)
Hemoglobin: 15.4 g/dL (ref 13.0–17.0)
Lymphocytes Relative: 22.1 % (ref 12.0–46.0)
Lymphs Abs: 1 10*3/uL (ref 0.7–4.0)
MCHC: 34 g/dL (ref 30.0–36.0)
MCV: 94.2 fl (ref 78.0–100.0)
Monocytes Absolute: 0.5 10*3/uL (ref 0.1–1.0)
Monocytes Relative: 9.8 % (ref 3.0–12.0)
Neutro Abs: 3 10*3/uL (ref 1.4–7.7)
Neutrophils Relative %: 65.2 % (ref 43.0–77.0)
Platelets: 140 10*3/uL — ABNORMAL LOW (ref 150.0–400.0)
RBC: 4.81 Mil/uL (ref 4.22–5.81)
RDW: 13.2 % (ref 11.5–15.5)
WBC: 4.6 10*3/uL (ref 4.0–10.5)

## 2020-12-25 LAB — VITAMIN B12: Vitamin B-12: >1506 pg/mL — ABNORMAL HIGH (ref 211–911)

## 2020-12-25 LAB — LIPID PANEL
Cholesterol: 125 mg/dL (ref 0–200)
HDL: 37.1 mg/dL — ABNORMAL LOW (ref >39.00)
LDL Cholesterol: 65 mg/dL (ref 0–99)
NonHDL: 87.92
Total CHOL/HDL Ratio: 3
Triglycerides: 114 mg/dL (ref 0.0–149.0)
VLDL: 22.8 mg/dL (ref 0.0–40.0)

## 2020-12-25 LAB — HEMOGLOBIN A1C: Hgb A1c MFr Bld: 5.7 % (ref 4.6–6.5)

## 2020-12-25 MED ORDER — CYANOCOBALAMIN 1000 MCG/ML IJ SOLN
1000.0000 ug | Freq: Once | INTRAMUSCULAR | Status: AC
  Administered 2020-12-25: 1000 ug via INTRAMUSCULAR

## 2020-12-25 NOTE — Telephone Encounter (Signed)
Vaccine documented.

## 2020-12-25 NOTE — Telephone Encounter (Signed)
Elmyra Ricks is calling from [employer] asking if we have received the forms they faxed for the patient on 12/18/20.

## 2020-12-25 NOTE — Telephone Encounter (Signed)
Pt received his Moderna Booster shot on 10/04/2020.

## 2020-12-26 NOTE — Telephone Encounter (Signed)
Have you seen these?

## 2021-01-03 NOTE — Telephone Encounter (Signed)
I have seen these and given the paperwork to Dr. Yong Channel! States that it could be span until we talk to the patient.

## 2021-01-09 ENCOUNTER — Telehealth: Payer: Self-pay | Admitting: Cardiology

## 2021-01-09 NOTE — Telephone Encounter (Signed)
° ° °  Pt said since Monday 01/06/21 her felt vibration on his left chest, he described it like a phone vibration, he said he feels it every 3-5 mins, he can fell it all day. He said 02/09 he had a yearly check up with pcp and everything was fine even his EKG, he doesn't know where the vibration coming from.

## 2021-01-09 NOTE — Telephone Encounter (Signed)
Returned the call to the patient. He stated that starting on Monday he has been having a weird vibration type feeling on the left side of his chest. He stated that it happens every 2-3 minutes. He denies chest pain and shortness of breath and stated that this is just the vibration feeling.   He currently takes Metoprolol Tartrate 25 mg bid. He did state that he feels like it could be his "skipped beats" getting worse.

## 2021-01-09 NOTE — Telephone Encounter (Signed)
I really have no idea what this vibration is coming from but does not sound cardiac. I can only think it is some muscle spasm or Neuropathic sensation  Angala Hilgers Martin[place] MD, [facility]

## 2021-01-10 NOTE — Telephone Encounter (Signed)
Spoke to patient Dr.Jordan's advice given.Stated vibration has stopped.Advised to keep appointment as planned with Dr.Jordan.

## 2021-01-27 DIAGNOSIS — M25552 Pain in left hip: Secondary | ICD-10-CM | POA: Diagnosis not present

## 2021-01-28 ENCOUNTER — Other Ambulatory Visit: Payer: Self-pay

## 2021-01-28 ENCOUNTER — Encounter (INDEPENDENT_AMBULATORY_CARE_PROVIDER_SITE_OTHER): Payer: Medicare Other | Admitting: Ophthalmology

## 2021-01-28 DIAGNOSIS — H35033 Hypertensive retinopathy, bilateral: Secondary | ICD-10-CM

## 2021-01-28 DIAGNOSIS — D3132 Benign neoplasm of left choroid: Secondary | ICD-10-CM

## 2021-01-28 DIAGNOSIS — I10 Essential (primary) hypertension: Secondary | ICD-10-CM | POA: Diagnosis not present

## 2021-01-28 DIAGNOSIS — H33301 Unspecified retinal break, right eye: Secondary | ICD-10-CM | POA: Diagnosis not present

## 2021-01-28 DIAGNOSIS — H353231 Exudative age-related macular degeneration, bilateral, with active choroidal neovascularization: Secondary | ICD-10-CM | POA: Diagnosis not present

## 2021-01-28 DIAGNOSIS — H43813 Vitreous degeneration, bilateral: Secondary | ICD-10-CM | POA: Diagnosis not present

## 2021-01-29 NOTE — Patient Instructions (Addendum)
DUE TO COVID-19 ONLY ONE VISITOR IS ALLOWED TO COME WITH YOU AND STAY IN THE WAITING ROOM ONLY DURING PRE OP AND PROCEDURE DAY OF SURGERY. THE 1 VISITOR  MAY VISIT WITH YOU AFTER SURGERY IN YOUR PRIVATE ROOM DURING VISITING HOURS ONLY!  YOU NEED TO HAVE A COVID 19 TEST ON: 02/07/21 @ 10:00 AM , THIS TEST MUST BE DONE BEFORE SURGERY,  COVID TESTING SITE [place] [address], IT IS ON THE RIGHT GOING OUT [address] APPROXIMATELY  2 MINUTES PAST [employer] ON THE RIGHT. ONCE YOUR COVID TEST IS COMPLETED,  PLEASE BEGIN THE QUARANTINE INSTRUCTIONS AS OUTLINED IN YOUR HANDOUT.                Joseph Simpson    Your procedure is scheduled on: 02/11/21   Report to [facility] Main  Entrance   Report to short stay at: 5:30 AM     Call this number if you have problems the morning of surgery [phone]    Remember:   NO SOLID FOOD AFTER MIDNIGHT THE NIGHT PRIOR TO SURGERY. NOTHING BY MOUTH EXCEPT CLEAR LIQUIDS UNTIL: 4:30 AM . PLEASE FINISH ENSURE DRINK PER SURGEON ORDER  WHICH NEEDS TO BE COMPLETED AT: 4:30 AM .  CLEAR LIQUID DIET  Foods Allowed                                                                     Foods Excluded  Coffee and tea, regular and decaf                             liquids that you cannot  Plain Jell-O any favor except red or purple                                           see through such as: Fruit ices (not with fruit pulp)                                     milk, soups, orange juice  Iced Popsicles                                    All solid food Carbonated beverages, regular and diet                                    Cranberry, grape and apple juices Sports drinks like Gatorade Lightly seasoned clear broth or consume(fat free) Sugar, honey syrup  Sample Menu Breakfast                                Lunch  Supper Cranberry juice                    Beef broth                             Chicken broth Jell-O                                     Grape juice                           Apple juice Coffee or tea                        Jell-O                                      Popsicle                                                Coffee or tea                        Coffee or tea  _____________________________________________________________________   BRUSH YOUR TEETH MORNING OF SURGERY AND RINSE YOUR MOUTH OUT, NO CHEWING GUM CANDY OR MINTS.    Take these medicines the morning of surgery with A SIP OF WATER: metoprolol,pantoprazole. Tamsulosin as needed.Eye drops as usual.                               You may not have any metal on your body including hair pins and              piercings  Do not wear jewelry, lotions, powders or perfumes, deodorant             Men may shave face and neck.   Do not bring valuables to the hospital. [place].  Contacts, dentures or bridgework may not be worn into surgery.  Leave suitcase in the car. After surgery it may be brought to your room.     Patients discharged the day of surgery will not be allowed to drive home. IF YOU ARE HAVING SURGERY AND GOING HOME THE SAME DAY, YOU MUST HAVE AN ADULT TO DRIVE YOU HOME AND BE WITH YOU FOR 24 HOURS. YOU MAY GO HOME BY TAXI OR UBER OR ORTHERWISE, BUT AN ADULT MUST ACCOMPANY YOU HOME AND STAY WITH YOU FOR 24 HOURS.  Name and phone number of your driver:  Special Instructions: N/A              Please read over the following fact sheets you were given: _____________________________________________________________________         [facility] - Preparing for Surgery Before surgery, you can play an important role.  Because skin is not sterile, your skin needs to be as free of germs as possible.  You can reduce the number of germs on your skin  by washing with CHG (chlorahexidine gluconate) soap before surgery.  CHG is an antiseptic cleaner which kills  germs and bonds with the skin to continue killing germs even after washing. Please DO NOT use if you have an allergy to CHG or antibacterial soaps.  If your skin becomes reddened/irritated stop using the CHG and inform your nurse when you arrive at Short Stay. Do not shave (including legs and underarms) for at least 48 hours prior to the first CHG shower.  You may shave your face/neck. Please follow these instructions carefully:  1.  Shower with CHG Soap the night before surgery and the  morning of Surgery.  2.  If you choose to wash your hair, wash your hair first as usual with your  normal  shampoo.  3.  After you shampoo, rinse your hair and body thoroughly to remove the  shampoo.                           4.  Use CHG as you would any other liquid soap.  You can apply chg directly  to the skin and wash                       Gently with a scrungie or clean washcloth.  5.  Apply the CHG Soap to your body ONLY FROM THE NECK DOWN.   Do not use on face/ open                           Wound or open sores. Avoid contact with eyes, ears mouth and genitals (private parts).                       Wash face,  Genitals (private parts) with your normal soap.             6.  Wash thoroughly, paying special attention to the area where your surgery  will be performed.  7.  Thoroughly rinse your body with warm water from the neck down.  8.  DO NOT shower/wash with your normal soap after using and rinsing off  the CHG Soap.                9.  Pat yourself dry with a clean towel.            10.  Wear clean pajamas.            11.  Place clean sheets on your bed the night of your first shower and do not  sleep with pets. Day of Surgery : Do not apply any lotions/deodorants the morning of surgery.  Please wear clean clothes to the hospital/surgery center.  FAILURE TO FOLLOW THESE INSTRUCTIONS MAY RESULT IN THE CANCELLATION OF YOUR SURGERY PATIENT SIGNATURE_________________________________  NURSE  SIGNATURE__________________________________  ________________________________________________________________________   Joseph Simpson  An incentive spirometer is a tool that can help keep your lungs clear and active. This tool measures how well you are filling your lungs with each breath. Taking long deep breaths may help reverse or decrease the chance of developing breathing (pulmonary) problems (especially infection) following:  A long period of time when you are unable to move or be active. BEFORE THE PROCEDURE   If the spirometer includes an indicator to show your best effort, your nurse or respiratory therapist will set it to a desired goal.  If possible, sit  up straight or lean slightly forward. Try not to slouch.  Hold the incentive spirometer in an upright position. INSTRUCTIONS FOR USE  1. Sit on the edge of your bed if possible, or sit up as far as you can in bed or on a chair. 2. Hold the incentive spirometer in an upright position. 3. Breathe out normally. 4. Place the mouthpiece in your mouth and seal your lips tightly around it. 5. Breathe in slowly and as deeply as possible, raising the piston or the ball toward the top of the column. 6. Hold your breath for 3-5 seconds or for as long as possible. Allow the piston or ball to fall to the bottom of the column. 7. Remove the mouthpiece from your mouth and breathe out normally. 8. Rest for a few seconds and repeat Steps 1 through 7 at least 10 times every 1-2 hours when you are awake. Take your time and take a few normal breaths between deep breaths. 9. The spirometer [place] include an indicator to show your best effort. Use the indicator as a goal to work toward during each repetition. 10. After each set of 10 deep breaths, practice coughing to be sure your lungs are clear. If you have an incision (the cut made at the time of surgery), support your incision when coughing by placing a pillow or rolled up towels firmly  against it. Once you are able to get out of bed, walk around indoors and cough well. You Gallery stop using the incentive spirometer when instructed by your caregiver.  RISKS AND COMPLICATIONS  Take your time so you do not get dizzy or light-headed.  If you are in pain, you Rogan need to take or ask for pain medication before doing incentive spirometry. It is harder to take a deep breath if you are having pain. AFTER USE  Rest and breathe slowly and easily.  It can be helpful to keep track of a log of your progress. Your caregiver can provide you with a simple table to help with this. If you are using the spirometer at home, follow these instructions: [place] IF:   You are having difficultly using the spirometer.  You have trouble using the spirometer as often as instructed.  Your pain medication is not giving enough relief while using the spirometer.  You develop fever of 100.5 F (38.1 C) or higher. SEEK IMMEDIATE MEDICAL CARE IF:   You cough up bloody sputum that had not been present before.  You develop fever of 102 F (38.9 C) or greater.  You develop worsening pain at or near the incision site. MAKE SURE YOU:   Understand these instructions.  Will watch your condition.  Will get help right away if you are not doing well or get worse. Document Released: 03/15/2007 Document Revised: 01/25/2012 Document Reviewed: 05/16/2007 [facility] Patient Information 2014 [place], [place].   ________________________________________________________________________

## 2021-01-30 ENCOUNTER — Encounter (HOSPITAL_COMMUNITY)
Admission: RE | Admit: 2021-01-30 | Discharge: 2021-01-30 | Disposition: A | Discharge status: Home / Self Care | Payer: Medicare Other | Source: Ambulatory Visit | Attending: Orthopedic Surgery | Admitting: Orthopedic Surgery

## 2021-01-30 ENCOUNTER — Encounter (HOSPITAL_COMMUNITY): Payer: Self-pay

## 2021-01-30 ENCOUNTER — Other Ambulatory Visit: Payer: Self-pay

## 2021-01-30 DIAGNOSIS — Z01812 Encounter for preprocedural laboratory examination: Secondary | ICD-10-CM | POA: Diagnosis not present

## 2021-01-30 HISTORY — DX: Cardiac murmur, unspecified: R01.1

## 2021-01-30 LAB — BASIC METABOLIC PANEL
Anion gap: 9 (ref 5–15)
BUN: 11 mg/dL (ref 8–23)
CO2: 29 mmol/L (ref 22–32)
Calcium: 9.7 mg/dL (ref 8.9–10.3)
Chloride: 102 mmol/L (ref 98–111)
Creatinine, Ser: 0.68 mg/dL (ref 0.61–1.24)
GFR, Estimated: >60 mL/min (ref >60)
Glucose, Bld: 120 mg/dL — ABNORMAL HIGH (ref 70–99)
Potassium: 4.4 mmol/L (ref 3.5–5.1)
Sodium: 140 mmol/L (ref 135–145)

## 2021-01-30 LAB — CBC
HCT: 48.4 % (ref 39.0–52.0)
Hemoglobin: 16 g/dL (ref 13.0–17.0)
MCH: 31.6 pg (ref 26.0–34.0)
MCHC: 33.1 g/dL (ref 30.0–36.0)
MCV: 95.7 fL (ref 80.0–100.0)
Platelets: 183 10*3/uL (ref 150–400)
RBC: 5.06 MIL/uL (ref 4.22–5.81)
RDW: 12.5 % (ref 11.5–15.5)
WBC: 5.5 10*3/uL (ref 4.0–10.5)
nRBC: 0 % (ref 0.0–0.2)

## 2021-01-30 LAB — SURGICAL PCR SCREEN
MRSA, PCR: NEGATIVE
Staphylococcus aureus: NEGATIVE

## 2021-01-30 NOTE — Progress Notes (Signed)
COVID Vaccine Completed: Yes Date COVID Vaccine completed: 09/2020 [place] COVID vaccine manufacturer: Pfizer      PCP - Dr. Garret Reddish. LOV: 12/25/20 Cardiologist - Dr. Peter Martinique. LOV: 10/28/20. Clearance: Kerin Ransom: 10/28/20.: EPIC  Chest x-ray -  EKG -  Stress Test -  ECHO -  Cardiac Cath -  Pacemaker/ICD device last checked:  Sleep Study - Yes CPAP - No  Fasting Blood Sugar -  Checks Blood Sugar _____ times a day  Blood Thinner Instructions: Aspirin Instructions: Last Dose:  Anesthesia review: Hx: HTN,PAC,PVC,CAD,OSA(NO CPAP)  Patient denies shortness of breath, fever, cough and chest pain at PAT appointment   Patient verbalized understanding of instructions that were given to them at the PAT appointment. Patient was also instructed that they will need to review over the PAT instructions again at home before surgery.

## 2021-01-31 NOTE — Progress Notes (Signed)
Anesthesia Chart Review   Case: [id number] Date/Time: 02/11/21 0715   Procedure: TOTAL HIP ARTHROPLASTY (Left Hip)   Anesthesia type: Choice   Pre-op diagnosis: DJD LEFT HIP   [facility] 07 / [facility]   Surgeons: Marchia Bond, MD      DISCUSSION:86 y.o. former smoker with h/o GERD, HTN, OSA, CAD, left hip djd scheduled for above procedure 02/11/2021 with Dr. Marchia Bond.   Pt last seen by cardiology 10/28/2020. Per OV note, "Pt is an acceptable risk for the proposed procedure without further cardiac work up."  Anticipate pt can proceed with planned procedure barring acute status change.   VS: BP (!) 145/72   Pulse 66   Temp 36.9 C (Oral)   Ht 6\' 1"  (1.854 m)   Wt 78.5 kg   SpO2 92%   BMI 22.82 kg/m   PROVIDERS: Marin Olp, MD is PCP   Martinique, Peter, MD is Cardiologist  LABS: Labs reviewed: Acceptable for surgery. (all labs ordered are listed, but only abnormal results are displayed)  Labs Reviewed  BASIC METABOLIC PANEL - Abnormal; Notable for the following components:      Result Value   Glucose, Bld 120 (*)    All other components within normal limits  SURGICAL PCR SCREEN  CBC     IMAGES:   EKG: 10/28/2020 Rate 53 bpm  Sinus bradycardia Nonspecific ST abnormality   CV: Echo 04/16/2009 Study Conclusions   1. Left ventricle: The cavity size was normal. Wall thickness was   increased in a pattern of mild LVH. Systolic function was normal.   The estimated ejection fraction was in the range of 55% to 60%.   Wall motion was normal; there were no regional wall motion   abnormalities.  2. Aortic valve: Trivial regurgitation.   Past Medical History:  Diagnosis Date  . Abnormal stress test December 2013   s/p cardiac cath with minimal nonobstructive CAD and normal LV function; medical management recommended  . Arthritis   . CAD (coronary artery disease)   . Dyslipidemia   . Dysrhythmia   . GERD (gastroesophageal reflux disease)    . Heart murmur   . History of hiatal hernia   . History of kidney stones   . Hypertension   . Macular degeneration   . Melanoma ([place])    skin  . Obstructive sleep apnea 09/23/2007   Wore cpap previously. Stopped and not interested despite risks.     . OSA (obstructive sleep apnea)   . PAC (premature atrial contraction) 10/25/2015  . PVC's (premature ventricular contractions) 09/27/2013  . SVT (supraventricular tachycardia) ([place])   . Syncope     Past Surgical History:  Procedure Laterality Date  . APPENDECTOMY    . CARDIAC CATHETERIZATION  December 2013   minimal nonobstructive CAD with normal LV function  . CATARACT EXTRACTION    . CYSTOSCOPY WITH LITHOLAPAXY N/A 07/13/2016   Procedure: CYSTOSCOPY WITH LITHOLAPAXY;  Surgeon: Franchot Gallo, MD;  [facility];  Service: Urology;  Laterality: N/A;  . CYSTOSCOPY/RETROGRADE/URETEROSCOPY/STONE EXTRACTION WITH BASKET  02/24/2012   Procedure: CYSTOSCOPY/RETROGRADE/URETEROSCOPY/STONE EXTRACTION WITH BASKET;  Surgeon: Franchot Gallo, MD;  [facility];  Service: Urology;  Laterality: Bilateral;  1 HOUR  . GASTRECTOMY    . HEMORRHOID SURGERY    . HERNIA REPAIR  07/13/2016  . INGUINAL HERNIA REPAIR Left 07/13/2016   Procedure: REPAIR LEFT INGUINAL HERNIA;  Surgeon: Armandina Gemma, MD;  Location[facility];  Service: General;  Laterality: Left;  .  INSERTION OF MESH Left 07/13/2016   Procedure: INSERTION OF MESH;  Surgeon: Armandina Gemma, MD;  Location[facility];  Service: General;  Laterality: Left;  Marland Kitchen MELANOMA EXCISION  05-05-2007   LEFT FOREARM  . NOSE SURGERY    . TONSILLECTOMY    . TOTAL SHOULDER ARTHROPLASTY Right 12/29/2017  . TOTAL SHOULDER ARTHROPLASTY Right 12/29/2017   Procedure: TOTAL SHOULDER ARTHROPLASTY;  Surgeon: Hiram Gash, MD;  Location: [facility];  Service: Orthopedics;  Laterality: Right;    MEDICATIONS: . acetaminophen (TYLENOL) 500 MG tablet  . atorvastatin (LIPITOR) 20 MG tablet  . bevacizumab  (AVASTIN) 2.5 mg/0.1 mL SOLN  . Carboxymeth-Glycerin-Polysorb (REFRESH OPTIVE ADVANCED OP)  . Carboxymethylcellulose Sodium 1 % GEL  . Cyanocobalamin (B-12 COMPLIANCE INJECTION IJ)  . losartan (COZAAR) 100 MG tablet  . metoCLOPramide (REGLAN) 5 MG tablet  . metoprolol tartrate (LOPRESSOR) 25 MG tablet  . pantoprazole (PROTONIX) 40 MG tablet  . polyethylene glycol (MIRALAX / GLYCOLAX) 17 g packet  . tamsulosin (FLOMAX) 0.4 MG CAPS capsule  . VIGAMOX 0.5 % ophthalmic solution   No current facility-administered medications for this encounter.   Konrad Felix, PA-C [facility] [phone]

## 2021-02-04 ENCOUNTER — Other Ambulatory Visit: Payer: Self-pay

## 2021-02-04 ENCOUNTER — Ambulatory Visit (INDEPENDENT_AMBULATORY_CARE_PROVIDER_SITE_OTHER): Payer: Medicare Other

## 2021-02-04 DIAGNOSIS — E538 Deficiency of other specified B group vitamins: Secondary | ICD-10-CM

## 2021-02-04 MED ORDER — CYANOCOBALAMIN 1000 MCG/ML IJ SOLN
1000.0000 ug | Freq: Once | INTRAMUSCULAR | Status: AC
  Administered 2021-02-04: 1000 ug via INTRAMUSCULAR

## 2021-02-04 NOTE — Care Plan (Signed)
Ortho Bundle Case Management Note  Patient Details  Name: Joseph Simpson MRN: [id number] Date of Birth: 1935-08-13   spoke with patient and wife prior to surgery. He will discharge to home with her help. Has rolling walker at home. [facility]PT referral [facility] care. OPPT set up with [facility]. Patient and MD in agreement with plan. Choice offered                   DME Arranged:    DME Agency:     HH Arranged:  PT HH Agency:  [facility]  Additional Comments: Please contact me with any questions of if this plan should need to change.  Ladell Heads,  [place] Specialist  [phone] 02/04/2021, 10:58 AM

## 2021-02-04 NOTE — Progress Notes (Signed)
Joseph Simpson 86 year old male, presents in office today for B12 injection per Francis Dowse. Administered CYANOCOBALAMIN 1,065mcg IM left deltoid. Patient tolerated injection well and is aware to return next month for next injection.

## 2021-02-04 NOTE — Progress Notes (Signed)
I have reviewed and agree with note, evaluation, plan.   Evaan Tidwell, MD  

## 2021-02-07 ENCOUNTER — Other Ambulatory Visit (HOSPITAL_COMMUNITY)
Admission: RE | Admit: 2021-02-07 | Discharge: 2021-02-07 | Disposition: A | Discharge status: Home / Self Care | Payer: Medicare Other | Source: Ambulatory Visit | Attending: Orthopedic Surgery | Admitting: Orthopedic Surgery

## 2021-02-07 DIAGNOSIS — Z01812 Encounter for preprocedural laboratory examination: Secondary | ICD-10-CM | POA: Diagnosis not present

## 2021-02-07 DIAGNOSIS — Z20822 Contact with and (suspected) exposure to covid-19: Secondary | ICD-10-CM | POA: Insufficient documentation

## 2021-02-07 LAB — SARS CORONAVIRUS 2 (TAT 6-24 HRS): SARS Coronavirus 2: NEGATIVE

## 2021-02-10 NOTE — H&P (Signed)
HIP ARTHROPLASTY ADMISSION H&P  Patient ID: Joseph Simpson MRN: [id number] DOB/AGE: [birth year] 86 y.o.  Chief Complaint: left hip pain.  Planned Procedure Date: 02/11/21 Medical Clearance by Dr. Garret Reddish, MD   Cardiac Clearance by Kerin Ransom, PA-C  HPI: Joseph Simpson is a 86 y.o. male who presents for evaluation of DJD LEFT HIP. The patient has a history of pain and functional disability in the left hip due to arthritis and has failed non-surgical conservative treatments for greater than 12 weeks to include NSAID's and/or analgesics, corticosteriod injections, use of assistive devices and activity modification.  Onset of symptoms was gradual, starting 3 years ago with gradually worsening course since that time. The patient noted no past surgery on the left hip.  Patient currently rates pain at 6 out of 10 with activity. Patient has night pain, worsening of pain with activity and weight bearing and pain that interferes with activities of daily living.  Patient has evidence of joint space narrowing by imaging studies.  There is no active infection.  Past Medical History:  Diagnosis Date  . Abnormal stress test December 2013   s/p cardiac cath with minimal nonobstructive CAD and normal LV function; medical management recommended  . Arthritis   . CAD (coronary artery disease)   . Dyslipidemia   . Dysrhythmia   . GERD (gastroesophageal reflux disease)   . Heart murmur   . History of hiatal hernia   . History of kidney stones   . Hypertension   . Macular degeneration   . Melanoma ([place])    skin  . Obstructive sleep apnea 09/23/2007   Wore cpap previously. Stopped and not interested despite risks.     . OSA (obstructive sleep apnea)   . PAC (premature atrial contraction) 10/25/2015  . PVC's (premature ventricular contractions) 09/27/2013  . SVT (supraventricular tachycardia) ([place])   . Syncope    Past Surgical History:  Procedure Laterality Date  . APPENDECTOMY    . CARDIAC  CATHETERIZATION  December 2013   minimal nonobstructive CAD with normal LV function  . CATARACT EXTRACTION    . CYSTOSCOPY WITH LITHOLAPAXY N/A 07/13/2016   Procedure: CYSTOSCOPY WITH LITHOLAPAXY;  Surgeon: Franchot Gallo, MD;  Location: [facility];  Service: Urology;  Laterality: N/A;  . CYSTOSCOPY/RETROGRADE/URETEROSCOPY/STONE EXTRACTION WITH BASKET  02/24/2012   Procedure: CYSTOSCOPY/RETROGRADE/URETEROSCOPY/STONE EXTRACTION WITH BASKET;  Surgeon: Franchot Gallo, MD;  Location: [facility];  Service: Urology;  Laterality: Bilateral;  1 HOUR  . GASTRECTOMY    . HEMORRHOID SURGERY    . HERNIA REPAIR  07/13/2016  . INGUINAL HERNIA REPAIR Left 07/13/2016   Procedure: REPAIR LEFT INGUINAL HERNIA;  Surgeon: Armandina Gemma, MD;  Location: [facility];  Service: General;  Laterality: Left;  . INSERTION OF MESH Left 07/13/2016   Procedure: INSERTION OF MESH;  Surgeon: Armandina Gemma, MD;  Location: [facility];  Service: General;  Laterality: Left;  Marland Kitchen MELANOMA EXCISION  05-05-2007   LEFT FOREARM  . NOSE SURGERY    . TONSILLECTOMY    . TOTAL SHOULDER ARTHROPLASTY Right 12/29/2017  . TOTAL SHOULDER ARTHROPLASTY Right 12/29/2017   Procedure: TOTAL SHOULDER ARTHROPLASTY;  Surgeon: Hiram Gash, MD;  Location: [facility];  Service: Orthopedics;  Laterality: Right;   Allergies  Allergen Reactions  . Aspirin Other (See Comments)    UPSET STOMACH  . Percocet [Oxycodone-Acetaminophen] Nausea And Vomiting  . Vitamins [Apatate] Other (See Comments)    Bothers stomach   Prior to Admission medications   Medication Sig  Start Date End Date Taking? Authorizing Provider  acetaminophen (TYLENOL) 500 MG tablet Take 500 mg by mouth 2 (two) times daily as needed for moderate pain or headache.   Yes [provider]  atorvastatin (LIPITOR) 20 MG tablet Take 20 mg by mouth every Monday, Wednesday, and Friday. 07/26/20  Yes Martinique, Peter M, MD  bevacizumab (AVASTIN) 2.5 mg/0.1 mL SOLN 2.5 mg by Intravitreal route.  Injection to eyes every 5 weeks - use with vigamox   Yes [provider]  Carboxymeth-Glycerin-Polysorb (REFRESH OPTIVE ADVANCED OP) Place 1 drop into both eyes daily.   Yes [provider]  Carboxymethylcellulose Sodium 1 % GEL Place 1 drop into both eyes daily.   Yes [provider]  Cyanocobalamin (B-12 COMPLIANCE INJECTION IJ) Inject 1 Dose as directed every 30 (thirty) days.   Yes [provider]  losartan (COZAAR) 100 MG tablet TAKE HALF TABLET BY MOUTH TWICE DAILY Patient taking differently: Take 50 mg by mouth 2 (two) times daily. 12/19/20  Yes Marin Olp, MD  metoCLOPramide (REGLAN) 5 MG tablet Take 5 mg by mouth 2 (two) times daily.   Yes [provider]  metoprolol tartrate (LOPRESSOR) 25 MG tablet TAKE ONE TABLET BY MOUTH TWICE DAILY Patient taking differently: Take 25 mg by mouth 2 (two) times daily. 06/10/20  Yes Martinique, Peter M, MD  pantoprazole (PROTONIX) 40 MG tablet Take 40 mg by mouth 2 (two) times daily.   Yes [provider]  polyethylene glycol (MIRALAX / GLYCOLAX) 17 g packet Take 17 g by mouth daily as needed for moderate constipation.   Yes [provider]  tamsulosin (FLOMAX) 0.4 MG CAPS capsule Take 0.4 mg by mouth daily as needed (urinary flow). 07/26/20  Yes Martinique, Peter M, MD  VIGAMOX 0.5 % ophthalmic solution Place 1 drop into both eyes as directed. Take every 5 weeks as directed - use with avastin, instill 1 drop into both eyes 4 times daily for 2 days after eye injections 05/28/14  Yes [provider]   Social History   Socioeconomic History  . Marital status: Widowed    Spouse name: Not on file  . Number of children: 1  . Years of education: Not on file  . Highest education level: Not on file  Occupational History  . Occupation: retired  Tobacco Use  . Smoking status: Former Smoker    Packs/day: 0.10    Years: 4.00    Pack years: 0.40    Types: Cigarettes    Quit date: 09/03/1956     Years since quitting: 64.4  . Smokeless tobacco: Never Used  Vaping Use  . Vaping Use: Never used  Substance and Sexual Activity  . Alcohol use: No    Alcohol/week: 0.0 standard drinks  . Drug use: No  . Sexual activity: Yes  Other Topics Concern  . 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       Hobbies: ride motorcycle (slingshot 3 year), rabbit and dear hunt   Social Determinants of Health   Financial Resource Strain: Low Risk   . Difficulty of Paying Living Expenses: Not hard at all  Food Insecurity: No Food Insecurity  . Worried About Charity fundraiser in the Last Year: Never true  . Ran Out of Food in  the Last Year: Never true  Transportation Needs: No Transportation Needs  . Lack of Transportation (Medical): No  . Lack of Transportation (Non-Medical): No  Physical Activity: Inactive  . Days of Exercise per Week: 0 days  . Minutes of Exercise per Session: 0 min  Stress: No Stress Concern Present  . Feeling of Stress : Not at all  Social Connections: Moderately Integrated  . Frequency of Communication with Friends and Family: More than three times a week  . Frequency of Social Gatherings with Friends and Family: More than three times a week  . Attends Religious Services: More than 4 times per year  . Active Member of Clubs or Organizations: Yes  . Attends Archivist Meetings: 1 to 4 times per year  . Marital Status: Widowed   Family History  Problem Relation Age of Onset  . Heart disease Mother        CABG in her 71s  . Pneumonia Father   . Cancer Sister        breast  . Lung cancer Brother        smoker    ROS: Currently denies lightheadedness, dizziness, Fever, chills, CP, SOB.   No personal history of DVT, PE, MI, or CVA. No loose teeth. Does have dentures which he can  remove day of surgery.  All other systems have been reviewed and were otherwise currently negative with the exception of those mentioned in the HPI and as above.  Objective: Vitals: Ht: 5\' 11"  Wt: 174.6 lbs Temp: 97.7 BP: 140/89 Pulse: 65 O2 97% on room air.   Physical Exam: General: Alert, NAD. Trendelenberg Gait  HEENT: EOMI, Good Neck Extension  Pulm: No increased work of breathing.  Clear B/L A/P w/o crackle or wheeze. CV: RRR. Systolic murmur. No g/r appreciated  GI: soft, NT, ND Neuro: Neuro without gross focal deficit.  Sensation intact distally Skin: No lesions in the area of chief complaint MSK/Surgical Site: left Hip pain with passive ROM. 0-90 degrees of forward flexion of the right hip; almost no ability to internally rotate.   5/5 strength.  NVI.    Imaging Review Plain radiographs demonstrate severe degenerative joint disease of the left hip.   The bone quality appears to be adequate for age and reported activity level.  Preoperative templating of the joint replacement has been completed, documented, and submitted to the Operating Room personnel in order to optimize intra-operative equipment management.  Assessment: DJD LEFT HIP Active Problems:   * No active hospital problems. *   Plan: Plan for Procedure(s): TOTAL HIP ARTHROPLASTY  The patient history, physical exam, clinical judgement of the provider and imaging are consistent with end stage degenerative joint disease and total joint arthroplasty is deemed medically necessary. The treatment options including medical management, injection therapy, and arthroplasty were discussed at length. The risks and benefits of Procedure(s): TOTAL HIP ARTHROPLASTY were presented and reviewed.  The risks of nonoperative treatment, versus surgical intervention including but not limited to continued pain, aseptic loosening, stiffness, dislocation/subluxation, infection, bleeding, nerve injury, blood clots, cardiopulmonary  complications, morbidity, mortality, among others were discussed. The patient verbalizes understanding and wishes to proceed with the plan.  Patient is being admitted for surgery, pain control, PT, prophylactic antibiotics, VTE prophylaxis, progressive ambulation, ADL's and discharge planning.   Dental prophylaxis discussed and recommended for 2 years postoperatively.   The patient does meet the criteria for TXA which will be used perioperatively.    Xarelto  will be used postoperatively  for DVT prophylaxis in addition to SCDs, and early ambulation.  The patient is planning to be discharged home with OPPT and HHPT in care of his son   Patient's anticipated LOS is less than 2 midnights, meeting these requirements: - Younger than 51 - Lives within 1 hour of care - Has a competent adult at home to recover with post-op recover - NO history of  - Chronic pain requiring opiods  - Diabetes  - Coronary Artery Disease  - Heart failure  - Heart attack  - Stroke  - DVT/VTE  - Cardiac arrhythmia  - Respiratory Failure/COPD  - Renal failure  - Anemia  - Advanced Liver disease       Ventura Bruns, PA-C 02/10/2021 1:59 PM

## 2021-02-11 ENCOUNTER — Observation Stay (HOSPITAL_COMMUNITY): Payer: Medicare Other

## 2021-02-11 ENCOUNTER — Encounter (HOSPITAL_COMMUNITY)
Admission: RE | Disposition: A | Discharge status: Home / Self Care | Payer: Self-pay | Source: Ambulatory Visit | Attending: Orthopedic Surgery

## 2021-02-11 ENCOUNTER — Ambulatory Visit (HOSPITAL_COMMUNITY): Payer: Medicare Other | Admitting: Anesthesiology

## 2021-02-11 ENCOUNTER — Ambulatory Visit (HOSPITAL_COMMUNITY): Payer: Medicare Other | Admitting: Physician Assistant

## 2021-02-11 ENCOUNTER — Encounter (HOSPITAL_COMMUNITY): Payer: Self-pay | Admitting: Orthopedic Surgery

## 2021-02-11 ENCOUNTER — Observation Stay (HOSPITAL_COMMUNITY)
Admission: RE | Admit: 2021-02-11 | Discharge: 2021-02-12 | Disposition: A | Discharge status: Home / Self Care | Payer: Medicare Other | Source: Ambulatory Visit | Attending: Orthopedic Surgery | Admitting: Orthopedic Surgery

## 2021-02-11 DIAGNOSIS — Z79899 Other long term (current) drug therapy: Secondary | ICD-10-CM | POA: Insufficient documentation

## 2021-02-11 DIAGNOSIS — I2510 Atherosclerotic heart disease of native coronary artery without angina pectoris: Secondary | ICD-10-CM | POA: Diagnosis not present

## 2021-02-11 DIAGNOSIS — Z96642 Presence of left artificial hip joint: Secondary | ICD-10-CM | POA: Diagnosis not present

## 2021-02-11 DIAGNOSIS — M1611 Unilateral primary osteoarthritis, right hip: Secondary | ICD-10-CM | POA: Diagnosis not present

## 2021-02-11 DIAGNOSIS — Z471 Aftercare following joint replacement surgery: Secondary | ICD-10-CM | POA: Diagnosis not present

## 2021-02-11 DIAGNOSIS — G4733 Obstructive sleep apnea (adult) (pediatric): Secondary | ICD-10-CM | POA: Diagnosis not present

## 2021-02-11 DIAGNOSIS — Z87891 Personal history of nicotine dependence: Secondary | ICD-10-CM | POA: Insufficient documentation

## 2021-02-11 DIAGNOSIS — M1612 Unilateral primary osteoarthritis, left hip: Principal | ICD-10-CM | POA: Insufficient documentation

## 2021-02-11 DIAGNOSIS — Z85820 Personal history of malignant melanoma of skin: Secondary | ICD-10-CM | POA: Diagnosis not present

## 2021-02-11 DIAGNOSIS — I10 Essential (primary) hypertension: Secondary | ICD-10-CM | POA: Insufficient documentation

## 2021-02-11 DIAGNOSIS — E785 Hyperlipidemia, unspecified: Secondary | ICD-10-CM | POA: Diagnosis not present

## 2021-02-11 HISTORY — PX: TOTAL HIP ARTHROPLASTY: SHX124

## 2021-02-11 SURGERY — ARTHROPLASTY, HIP, TOTAL,POSTERIOR APPROACH
Anesthesia: Spinal | Site: Hip | Laterality: Left

## 2021-02-11 MED ORDER — MORPHINE SULFATE (PF) 2 MG/ML IV SOLN: 0.5000 mg | INTRAVENOUS | Status: DC | PRN

## 2021-02-11 MED ORDER — MAGNESIUM CITRATE PO SOLN: 1.0000 | Freq: Once | ORAL | Status: DC | PRN

## 2021-02-11 MED ORDER — DEXAMETHASONE SODIUM PHOSPHATE 10 MG/ML IJ SOLN
10.0000 mg | Freq: Once | INTRAMUSCULAR | Status: AC
  Administered 2021-02-12: 10 mg via INTRAVENOUS
  Filled 2021-02-11: qty 1

## 2021-02-11 MED ORDER — ONDANSETRON HCL 4 MG PO TABS: 4.0000 mg | ORAL_TABLET | Freq: Four times a day (QID) | ORAL | Status: DC | PRN

## 2021-02-11 MED ORDER — ONDANSETRON HCL 4 MG/2ML IJ SOLN: 4.0000 mg | Freq: Four times a day (QID) | INTRAMUSCULAR | Status: DC | PRN

## 2021-02-11 MED ORDER — POLYVINYL ALCOHOL 1.4 % OP SOLN
1.0000 [drp] | Freq: Every day | OPHTHALMIC | Status: DC
  Administered 2021-02-12: 1 [drp] via OPHTHALMIC
  Filled 2021-02-11: qty 15

## 2021-02-11 MED ORDER — PHENOL 1.4 % MT LIQD: 1.0000 | OROMUCOSAL | Status: DC | PRN

## 2021-02-11 MED ORDER — DOCUSATE SODIUM 100 MG PO CAPS
100.0000 mg | ORAL_CAPSULE | Freq: Two times a day (BID) | ORAL | Status: DC
  Administered 2021-02-11 – 2021-02-12 (×2): 100 mg via ORAL
  Filled 2021-02-11 (×2): qty 1

## 2021-02-11 MED ORDER — ACETAMINOPHEN 500 MG PO TABS
ORAL_TABLET | ORAL | Status: AC
  Administered 2021-02-11: 1000 mg via ORAL
  Filled 2021-02-11: qty 2

## 2021-02-11 MED ORDER — ONDANSETRON HCL 4 MG/2ML IJ SOLN
INTRAMUSCULAR | Status: AC
  Filled 2021-02-11: qty 2

## 2021-02-11 MED ORDER — FENTANYL CITRATE (PF) 100 MCG/2ML IJ SOLN
INTRAMUSCULAR | Status: DC | PRN
  Administered 2021-02-11: 50 ug via INTRAVENOUS

## 2021-02-11 MED ORDER — PHENYLEPHRINE HCL-NACL 10-0.9 MG/250ML-% IV SOLN
INTRAVENOUS | Status: DC | PRN
  Administered 2021-02-11: 25 ug/min via INTRAVENOUS

## 2021-02-11 MED ORDER — RIVAROXABAN 10 MG PO TABS
10.0000 mg | ORAL_TABLET | Freq: Every day | ORAL | Status: DC
  Administered 2021-02-12: 10 mg via ORAL
  Filled 2021-02-11: qty 1

## 2021-02-11 MED ORDER — ACETAMINOPHEN 500 MG PO TABS
500.0000 mg | ORAL_TABLET | Freq: Four times a day (QID) | ORAL | Status: DC
  Administered 2021-02-11 – 2021-02-12 (×3): 500 mg via ORAL
  Filled 2021-02-11 (×3): qty 1

## 2021-02-11 MED ORDER — FENTANYL CITRATE (PF) 100 MCG/2ML IJ SOLN: 25.0000 ug | INTRAMUSCULAR | Status: DC | PRN

## 2021-02-11 MED ORDER — POLYETHYLENE GLYCOL 3350 17 G PO PACK: 17.0000 g | PACK | Freq: Every day | ORAL | Status: DC | PRN

## 2021-02-11 MED ORDER — STERILE WATER FOR IRRIGATION IR SOLN
Status: DC | PRN
  Administered 2021-02-11: 2000 mL

## 2021-02-11 MED ORDER — LACTATED RINGERS IV SOLN: INTRAVENOUS | Status: DC

## 2021-02-11 MED ORDER — KETOROLAC TROMETHAMINE 30 MG/ML IJ SOLN
INTRAMUSCULAR | Status: AC
  Filled 2021-02-11: qty 1

## 2021-02-11 MED ORDER — FENTANYL CITRATE (PF) 100 MCG/2ML IJ SOLN
INTRAMUSCULAR | Status: AC
  Filled 2021-02-11: qty 2

## 2021-02-11 MED ORDER — TRANEXAMIC ACID-NACL 1000-0.7 MG/100ML-% IV SOLN
INTRAVENOUS | Status: AC
  Filled 2021-02-11: qty 100

## 2021-02-11 MED ORDER — TRAMADOL HCL 50 MG PO TABS
50.0000 mg | ORAL_TABLET | Freq: Four times a day (QID) | ORAL | Status: DC | PRN
Start: 2021-02-11 — End: 2021-02-12

## 2021-02-11 MED ORDER — HYDROCODONE-ACETAMINOPHEN 5-325 MG PO TABS
1.0000 | ORAL_TABLET | ORAL | Status: DC | PRN
  Administered 2021-02-11: 1 via ORAL
  Administered 2021-02-11: 2 via ORAL
  Filled 2021-02-11: qty 2
  Filled 2021-02-11: qty 1

## 2021-02-11 MED ORDER — ALUM & MAG HYDROXIDE-SIMETH 200-200-20 MG/5ML PO SUSP
30.0000 mL | ORAL | Status: DC | PRN
Start: 2021-02-11 — End: 2021-02-12

## 2021-02-11 MED ORDER — BUPIVACAINE IN DEXTROSE 0.75-8.25 % IT SOLN
INTRATHECAL | Status: DC | PRN
  Administered 2021-02-11: 2 mL via INTRATHECAL

## 2021-02-11 MED ORDER — CEFAZOLIN SODIUM-DEXTROSE 2-4 GM/100ML-% IV SOLN
INTRAVENOUS | Status: AC
  Filled 2021-02-11: qty 100

## 2021-02-11 MED ORDER — ATORVASTATIN CALCIUM 20 MG PO TABS
20.0000 mg | ORAL_TABLET | ORAL | Status: DC
  Administered 2021-02-12: 20 mg via ORAL
  Filled 2021-02-11 (×2): qty 1

## 2021-02-11 MED ORDER — MIDAZOLAM HCL 2 MG/2ML IJ SOLN
INTRAMUSCULAR | Status: AC
  Filled 2021-02-11: qty 2

## 2021-02-11 MED ORDER — PROPOFOL 1000 MG/100ML IV EMUL
INTRAVENOUS | Status: AC
  Filled 2021-02-11: qty 100

## 2021-02-11 MED ORDER — CEFAZOLIN SODIUM-DEXTROSE 2-4 GM/100ML-% IV SOLN
2.0000 g | INTRAVENOUS | Status: AC
  Administered 2021-02-11: 2 g via INTRAVENOUS

## 2021-02-11 MED ORDER — ALBUMIN HUMAN 5 % IV SOLN
INTRAVENOUS | Status: AC
  Filled 2021-02-11: qty 250

## 2021-02-11 MED ORDER — BUPIVACAINE HCL (PF) 0.25 % IJ SOLN
INTRAMUSCULAR | Status: DC | PRN
  Administered 2021-02-11: 30 mL

## 2021-02-11 MED ORDER — TAMSULOSIN HCL 0.4 MG PO CAPS: 0.4000 mg | ORAL_CAPSULE | Freq: Every day | ORAL | Status: DC | PRN

## 2021-02-11 MED ORDER — TRANEXAMIC ACID-NACL 1000-0.7 MG/100ML-% IV SOLN
1000.0000 mg | INTRAVENOUS | Status: AC
  Administered 2021-02-11: 1000 mg via INTRAVENOUS

## 2021-02-11 MED ORDER — ALBUMIN HUMAN 5 % IV SOLN: INTRAVENOUS | Status: DC | PRN

## 2021-02-11 MED ORDER — ACETAMINOPHEN 325 MG PO TABS: 325.0000 mg | ORAL_TABLET | Freq: Four times a day (QID) | ORAL | Status: DC | PRN

## 2021-02-11 MED ORDER — PHENYLEPHRINE HCL (PRESSORS) 10 MG/ML IV SOLN
INTRAVENOUS | Status: AC
  Filled 2021-02-11: qty 1

## 2021-02-11 MED ORDER — CEFAZOLIN SODIUM-DEXTROSE 1-4 GM/50ML-% IV SOLN
1.0000 g | Freq: Four times a day (QID) | INTRAVENOUS | Status: AC
  Administered 2021-02-11 – 2021-02-12 (×2): 1 g via INTRAVENOUS
  Filled 2021-02-11 (×2): qty 50

## 2021-02-11 MED ORDER — PROPOFOL 10 MG/ML IV BOLUS
INTRAVENOUS | Status: DC | PRN
  Administered 2021-02-11: 30 mg via INTRAVENOUS
  Administered 2021-02-11: 20 mg via INTRAVENOUS

## 2021-02-11 MED ORDER — CHLORHEXIDINE GLUCONATE 0.12 % MT SOLN: 15.0000 mL | Freq: Once | OROMUCOSAL | Status: AC

## 2021-02-11 MED ORDER — SODIUM CHLORIDE 0.9 % IR SOLN
Status: DC | PRN
  Administered 2021-02-11: 1000 mL

## 2021-02-11 MED ORDER — PROPOFOL 500 MG/50ML IV EMUL
INTRAVENOUS | Status: DC | PRN
  Administered 2021-02-11: 50 ug/kg/min via INTRAVENOUS

## 2021-02-11 MED ORDER — ORAL CARE MOUTH RINSE
15.0000 mL | Freq: Once | OROMUCOSAL | Status: AC
  Administered 2021-02-11: 15 mL via OROMUCOSAL

## 2021-02-11 MED ORDER — BISACODYL 10 MG RE SUPP: 10.0000 mg | Freq: Every day | RECTAL | Status: DC | PRN

## 2021-02-11 MED ORDER — BUPIVACAINE HCL (PF) 0.25 % IJ SOLN
INTRAMUSCULAR | Status: AC
  Filled 2021-02-11: qty 30

## 2021-02-11 MED ORDER — DEXAMETHASONE SODIUM PHOSPHATE 10 MG/ML IJ SOLN
INTRAMUSCULAR | Status: DC | PRN
  Administered 2021-02-11: 8 mg via INTRAVENOUS

## 2021-02-11 MED ORDER — CARBOXYMETHYLCELLULOSE SODIUM 1 % OP GEL
1.0000 [drp] | Freq: Every day | OPHTHALMIC | Status: DC
Start: 2021-02-12 — End: 2021-02-11

## 2021-02-11 MED ORDER — DIPHENHYDRAMINE HCL 12.5 MG/5ML PO ELIX
12.5000 mg | ORAL_SOLUTION | ORAL | Status: DC | PRN
  Administered 2021-02-12: 25 mg via ORAL
  Filled 2021-02-11: qty 10

## 2021-02-11 MED ORDER — LOSARTAN POTASSIUM 50 MG PO TABS
50.0000 mg | ORAL_TABLET | Freq: Two times a day (BID) | ORAL | Status: DC
  Administered 2021-02-11 – 2021-02-12 (×2): 50 mg via ORAL
  Filled 2021-02-11 (×2): qty 1

## 2021-02-11 MED ORDER — PANTOPRAZOLE SODIUM 40 MG PO TBEC
40.0000 mg | DELAYED_RELEASE_TABLET | Freq: Two times a day (BID) | ORAL | Status: DC
  Administered 2021-02-11 – 2021-02-12 (×2): 40 mg via ORAL
  Filled 2021-02-11 (×2): qty 1

## 2021-02-11 MED ORDER — POTASSIUM CHLORIDE IN NACL 20-0.45 MEQ/L-% IV SOLN
INTRAVENOUS | Status: DC
  Filled 2021-02-11 (×2): qty 1000

## 2021-02-11 MED ORDER — DEXAMETHASONE SODIUM PHOSPHATE 10 MG/ML IJ SOLN
INTRAMUSCULAR | Status: AC
  Filled 2021-02-11: qty 1

## 2021-02-11 MED ORDER — METOCLOPRAMIDE HCL 5 MG/ML IJ SOLN: 5.0000 mg | Freq: Three times a day (TID) | INTRAMUSCULAR | Status: DC | PRN

## 2021-02-11 MED ORDER — POVIDONE-IODINE 10 % EX SWAB
2.0000 "application " | Freq: Once | CUTANEOUS | Status: AC
  Administered 2021-02-11: 2 via TOPICAL

## 2021-02-11 MED ORDER — ACETAMINOPHEN 500 MG PO TABS: 1000.0000 mg | ORAL_TABLET | Freq: Once | ORAL | Status: AC

## 2021-02-11 MED ORDER — PROPOFOL 10 MG/ML IV BOLUS
INTRAVENOUS | Status: AC
  Filled 2021-02-11: qty 20

## 2021-02-11 MED ORDER — MIDAZOLAM HCL 5 MG/5ML IJ SOLN
INTRAMUSCULAR | Status: DC | PRN
  Administered 2021-02-11: .5 mg via INTRAVENOUS

## 2021-02-11 MED ORDER — METOCLOPRAMIDE HCL 5 MG PO TABS: 5.0000 mg | ORAL_TABLET | Freq: Three times a day (TID) | ORAL | Status: DC | PRN

## 2021-02-11 MED ORDER — KETOROLAC TROMETHAMINE 30 MG/ML IJ SOLN
INTRAMUSCULAR | Status: DC | PRN
  Administered 2021-02-11: 30 mg

## 2021-02-11 MED ORDER — MENTHOL 3 MG MT LOZG: 1.0000 | LOZENGE | OROMUCOSAL | Status: DC | PRN

## 2021-02-11 SURGICAL SUPPLY — 61 items
ARTICULEZE HEAD (Hips) ×2 IMPLANT
BIT DRILL 2.0X128 (BIT) ×2 IMPLANT
BLADE SAW SAG 73X25 THK (BLADE) ×1
BLADE SAW SGTL 73X25 THK (BLADE) ×1 IMPLANT
CLSR STERI-STRIP ANTIMIC 1/2X4 (GAUZE/BANDAGES/DRESSINGS) ×4 IMPLANT
COVER SURGICAL LIGHT HANDLE (MISCELLANEOUS) ×2 IMPLANT
COVER WAND RF STERILE (DRAPES) IMPLANT
CUP ACET PNNCL SECTR W/GRIP 56 (Hips) IMPLANT
DRAPE INCISE IOBAN 66X45 STRL (DRAPES) ×2 IMPLANT
DRAPE ORTHO SPLIT 77X108 STRL (DRAPES) ×4
DRAPE POUCH INSTRU U-SHP 10X18 (DRAPES) ×2 IMPLANT
DRAPE SHEET LG 3/4 BI-LAMINATE (DRAPES) ×2 IMPLANT
DRAPE SURG 17X11 SM STRL (DRAPES) ×2 IMPLANT
DRAPE SURG ORHT 6 SPLT 77X108 (DRAPES) ×2 IMPLANT
DRAPE U-SHAPE 47X51 STRL (DRAPES) ×2 IMPLANT
DRSG MEPILEX BORDER 4X8 (GAUZE/BANDAGES/DRESSINGS) ×2 IMPLANT
DURAPREP 26ML APPLICATOR (WOUND CARE) ×4 IMPLANT
ELECT BLADE TIP CTD 4 INCH (ELECTRODE) ×2 IMPLANT
ELECT REM PT RETURN 15FT ADLT (MISCELLANEOUS) ×2 IMPLANT
ELIMINATOR HOLE APEX DEPUY (Hips) ×1 IMPLANT
FACESHIELD WRAPAROUND (MASK) ×2 IMPLANT
FACESHIELD WRAPAROUND OR TEAM (MASK) ×1 IMPLANT
GLOVE SRG 8 PF TXTR STRL LF DI (GLOVE) ×1 IMPLANT
GLOVE SURG ENC MOIS LTX SZ7 (GLOVE) ×2 IMPLANT
GLOVE SURG ENC MOIS LTX SZ7.5 (GLOVE) ×2 IMPLANT
GLOVE SURG UNDER LTX SZ6.5 (GLOVE) ×2 IMPLANT
GLOVE SURG UNDER POLY LF SZ8 (GLOVE) ×2
GOWN STRL REUS W/TWL LRG LVL3 (GOWN DISPOSABLE) ×4 IMPLANT
HEAD ARTICULEZE (Hips) IMPLANT
HOOD PEEL AWAY FLYTE STAYCOOL (MISCELLANEOUS) ×6 IMPLANT
KIT BASIN OR (CUSTOM PROCEDURE TRAY) ×2 IMPLANT
KIT TURNOVER KIT A (KITS) ×2 IMPLANT
MANIFOLD NEPTUNE II (INSTRUMENTS) ×2 IMPLANT
NDL MA TROC 1/2 (NEEDLE) IMPLANT
NDL SAFETY ECLIPSE 18X1.5 (NEEDLE) ×2 IMPLANT
NEEDLE HYPO 18GX1.5 SHARP (NEEDLE) ×4
NEEDLE MA TROC 1/2 (NEEDLE) IMPLANT
NS IRRIG 1000ML POUR BTL (IV SOLUTION) ×2 IMPLANT
PACK TOTAL JOINT (CUSTOM PROCEDURE TRAY) ×2 IMPLANT
PENCIL SMOKE EVACUATOR (MISCELLANEOUS) IMPLANT
PINN SECTOR W/GRIP ACE CUP 56 (Hips) ×2 IMPLANT
PINNACLE ALTRX PLUS 4 N 36X56 (Hips) ×1 IMPLANT
PROTECTOR NERVE ULNAR (MISCELLANEOUS) ×2 IMPLANT
RETRIEVER SUT HEWSON (MISCELLANEOUS) ×2 IMPLANT
SCREW 6.5MMX25MM (Screw) ×1 IMPLANT
STEM HIP W/DUOFIX (Stem) ×1 IMPLANT
SUCTION FRAZIER HANDLE 12FR (TUBING) ×2
SUCTION TUBE FRAZIER 12FR DISP (TUBING) ×1 IMPLANT
SUT FIBERWIRE #2 38 REV NDL BL (SUTURE) ×6
SUT VIC AB 0 CT1 36 (SUTURE) ×2 IMPLANT
SUT VIC AB 1 CT1 36 (SUTURE) ×4 IMPLANT
SUT VIC AB 2-0 CT1 27 (SUTURE) ×4
SUT VIC AB 2-0 CT1 TAPERPNT 27 (SUTURE) ×2 IMPLANT
SUT VIC AB 3-0 SH 8-18 (SUTURE) ×2 IMPLANT
SUTURE FIBERWR#2 38 REV NDL BL (SUTURE) ×3 IMPLANT
SYR CONTROL 10ML LL (SYRINGE) ×4 IMPLANT
TAPE STRIPS DRAPE STRL (GAUZE/BANDAGES/DRESSINGS) ×1 IMPLANT
TOWEL OR 17X26 10 PK STRL BLUE (TOWEL DISPOSABLE) ×2 IMPLANT
TRAY FOLEY MTR SLVR 16FR STAT (SET/KITS/TRAYS/PACK) ×2 IMPLANT
TUBE SUCTION HIGH CAP CLEAR NV (SUCTIONS) ×1 IMPLANT
WATER STERILE IRR 1000ML POUR (IV SOLUTION) ×4 IMPLANT

## 2021-02-11 NOTE — Anesthesia Postprocedure Evaluation (Signed)
Anesthesia Post Note  Patient: GIOMAR GUSLER  Procedure(s) Performed: TOTAL HIP ARTHROPLASTY (Left Hip)     Patient location during evaluation: PACU Anesthesia Type: Spinal Level of consciousness: oriented and awake and alert Pain management: pain level controlled Vital Signs Assessment: post-procedure vital signs reviewed and stable Respiratory status: spontaneous breathing, respiratory function stable and patient connected to nasal cannula oxygen Cardiovascular status: blood pressure returned to baseline and stable Postop Assessment: no headache, no backache, no apparent nausea or vomiting, spinal receding and patient able to bend at knees Anesthetic complications: no   No complications documented.  Last Vitals:  Vitals:   02/11/21 1415 02/11/21 1430  BP: 125/68 137/78  Pulse: (!) 55 (!) 48  Resp: 12 15  Temp:    SpO2: 100% 100%    Last Pain:  Vitals:   02/11/21 1430  TempSrc:   PainSc: 0-No pain                 Eddy Termine,W. EDMOND

## 2021-02-11 NOTE — Interval H&P Note (Signed)
History and Physical Interval Note:  02/11/2021 10:08 AM  Joseph Simpson  has presented today for surgery, with the diagnosis of DJD LEFT HIP.  The various methods of treatment have been discussed with the patient and family. After consideration of risks, benefits and other options for treatment, the patient has consented to  Procedure(s): TOTAL HIP ARTHROPLASTY (Left) as a surgical intervention.  The patient's history has been reviewed, patient examined, no change in status, stable for surgery.  I have reviewed the patient's chart and labs.  Questions were answered to the patient's satisfaction.     Johnny Bridge

## 2021-02-11 NOTE — Anesthesia Procedure Notes (Signed)
Procedure Name: MAC Date/Time: 02/11/2021 11:16 AM Performed by: Lissa Morales, CRNA Pre-anesthesia Checklist: Patient identified, Emergency Drugs available, Suction available and Patient being monitored Patient Re-evaluated:Patient Re-evaluated prior to induction Oxygen Delivery Method: Simple face mask Placement Confirmation: positive ETCO2

## 2021-02-11 NOTE — Anesthesia Procedure Notes (Signed)
Spinal  Patient location during procedure: OR End time: 02/11/2021 11:25 AM Reason for block: surgical anesthesia Staffing Performed: resident/CRNA  Resident/CRNA: Lissa Morales, CRNA Preanesthetic Checklist Completed: patient identified, IV checked, risks and benefits discussed, surgical consent, monitors and equipment checked, pre-op evaluation and timeout performed Spinal Block Patient position: sitting Prep: DuraPrep Patient monitoring: heart rate, continuous pulse ox and blood pressure Approach: midline Location: L3-4 Injection technique: single-shot Needle Needle type: Pencan  Needle gauge: 24 G Needle length: 9 cm Assessment Events: CSF return Additional Notes Expiration date of kit checked and confirmed. Patient tolerated procedure well, without complications.

## 2021-02-11 NOTE — Op Note (Signed)
02/11/2021  1:07 PM  PATIENT:  Joseph Simpson   MRN: [id number]  PRE-OPERATIVE DIAGNOSIS:  Left hip primary localized osteoarthritis  POST-OPERATIVE DIAGNOSIS:  same  PROCEDURE:  Procedure(s): TOTAL HIP ARTHROPLASTY  PREOPERATIVE INDICATIONS:    Joseph Simpson is an 86 y.o. male who has a diagnosis of LEFT HIP OSTEOARTHRITIS and elected for surgical management after failing conservative treatment.  The risks benefits and alternatives were discussed with the patient including but not limited to the risks of nonoperative treatment, versus surgical intervention including infection, bleeding, nerve injury, periprosthetic fracture, the need for revision surgery, dislocation, leg length discrepancy, blood clots, cardiopulmonary complications, morbidity, mortality, among others, and they were willing to proceed.     OPERATIVE REPORT     SURGEON:  Marchia Bond, MD    ASSISTANT:  Merlene Pulling, PA-C, (Present throughout the entire procedure,  necessary for completion of procedure in a timely manner, assisting with retraction, instrumentation, and closure)     ANESTHESIA:  Spinal  ESTIMATED BLOOD LOSS: 209 ml    COMPLICATIONS:  None.     UNIQUE ASPECTS OF THE CASE: I did not medialize the cup very much.  I had more bone that I could take if needed, but had reasonably good coverage, and was matching his anatomy.  I had just a little bit of beads showing posteriorly and superiorly.  At first I trialed with a 6, but after trialing it was a little bit rotationally loose, and I could not get the reamer down to a 7, but after lateralizing further the reamer advanced well, and the 7 fit well.  The soft tissue closure was excellent, and I felt like 5 restored his length better than the 1.5.  COMPONENTS:  [employer] fit femur size 7 with a 36 mm + 5 metallic head ball and a Gription Acetabular shell size 56, with a single cancellous screw for backup fixation, with an apex hole eliminator  and a +4 neutral polyethylene liner.    PROCEDURE IN DETAIL:   The patient was met in the holding area and  identified.  The appropriate hip was identified and marked at the operative site.  The patient was then transported to the OR  and  placed under anesthesia.  At that point, the patient was  placed in the lateral decubitus position with the operative side up and  secured to the operating room table and all bony prominences padded.     The operative lower extremity was prepped from the iliac crest to the distal leg.  Sterile draping was performed.  Time out was performed prior to incision.      A routine posterolateral approach was utilized via sharp dissection  carried down to the subcutaneous tissue.  Gross bleeders were Bovie coagulated.  The iliotibial band was identified and incised along the length of the skin incision.  Self-retaining retractors were  inserted.  With the hip internally rotated, the short external rotators  were identified. The piriformis and capsule was tagged with FiberWire, and the hip capsule released in a T-type fashion.  The femoral neck was exposed, and I resected the femoral neck using the appropriate jig. This was performed at approximately a thumb's breadth above the lesser trochanter.    I then exposed the deep acetabulum, cleared out any tissue including the ligamentum teres.  A wing retractor was placed.  After adequate visualization, I excised the labrum, and then sequentially reamed.  I placed the trial acetabulum, which seated  nicely, and then impacted the real cup into place.  Appropriate version and inclination was confirmed clinically matching their bony anatomy, and also with the use of the jig.  I placed a cancellous screw to augment fixation.  A trial polyethylene liner was placed and the wing retractor removed.    I then prepared the proximal femur using the cookie-cutter, the lateralizing reamer, and then sequentially reamed and broached.  A trial  broach, neck, and head was utilized, and I reduced the hip and it was found to have excellent stability with functional range of motion. The trial components were then removed, and the real polyethylene liner was placed.  I then impacted the real femoral prosthesis into place into the appropriate version, slightly anteverted to the normal anatomy, and I impacted the real head ball into place. The hip was then reduced and taken through functional range of motion and found to have excellent stability. Leg lengths were restored.  I then used a 2 mm drill bits to pass the FiberWire suture from the capsule and piriformis through the greater trochanter, and secured this. Excellent posterior capsular repair was achieved. I also closed the T in the capsule.  I then irrigated the hip copiously again with pulse lavage, and repaired the fascia with Vicryl, followed by Vicryl for the subcutaneous tissue, Monocryl for the skin, Steri-Strips and sterile gauze. The wounds were injected. The patient was then awakened and returned to PACU in stable and satisfactory condition. There were no complications.  Marchia Bond, MD Orthopedic Surgeon [phone]   02/11/2021 1:07 PM

## 2021-02-11 NOTE — Evaluation (Signed)
Physical Therapy Evaluation Patient Details Name: Joseph Simpson MRN: [id number] DOB: 01-04-1935 Today's Date: 02/11/2021   History of Present Illness  Patient is 86 y.o. male s/p Lt THA posterior approach on 02/11/21 with PMH significant for OA, SVT, PVCs, PACs, HTn HLD, CAD, GERD, melanoma, Rt TSA in 2019.  Clinical Impression  Pt is an 86y.o. male s/p LT THA posterior approach POD 0. Pt reports that he is modified independent with use of cane for mobility at baseline. Therapist reviewed posterior hip precautions and provided handout prior to mobility. Pt required MIN guard with cues for safe hand placement and techniques to maintain adherence to precautions for sit to stand transfer., pt demonstrated understanding with verbal cuing. Pt required MIN assist progressing to MIN guard for safety with ambulation ~69ft with verbal cues for RW management and step to gait pattern with no LOB. PT reviewed therapeutic intervention for promotion of DVT prevention, pt demonstrated understanding. Pt will benefit from continued review of posterior hip precautions to ensure understanding and improve adherence. Pt will have assistance from his son upon discharge. Pt will benefit from skilled PT to increase independence and safety with mobility. Acute therapy to follow up during stay to progress functional mobility as able to ensure safe discharge home.       Follow Up Recommendations Follow surgeon's recommendation for DC plan and follow-up therapies;Home health PT    Equipment Recommendations  None recommended by PT (pt owns RW)    Recommendations for Other Services       Precautions / Restrictions Precautions Precautions: Posterior Hip;Fall Precaution Booklet Issued: Yes (comment) Restrictions Weight Bearing Restrictions: Yes      Mobility  Bed Mobility Overal bed mobility: Needs Assistance Bed Mobility: Supine to Sit     Supine to sit: Min guard;HOB elevated     General bed mobility  comments: Therapist reviewed posterior hip precuations prior to mobility. MIN guard for safety with cues for safe hand placment    Transfers Overall transfer level: Needs assistance Equipment used: Rolling walker (2 wheeled) Transfers: Sit to/from Stand Sit to Stand: Min guard;From elevated surface         General transfer comment: MIN guard for safety with cues for safe hand placement. Therapist provided techniques to maintain adherence to posterior hip precautions during transfer  Ambulation/Gait Ambulation/Gait assistance: Min assist;Min guard Gait Distance (Feet): 30 Feet Assistive device: Rolling walker (2 wheeled) Gait Pattern/deviations: Step-to pattern;Decreased stride length;Decreased weight shift to left Gait velocity: decr   General Gait Details: MIN assist progressing to MIN guard for safety with cues for step to gait pattern and to pick up foot with turns to maintain posterior hip precautions, pt demonstrated improved understanding with verbcal cues.  Stairs            Wheelchair Mobility    Modified Rankin (Stroke Patients Only)       Balance Overall balance assessment: Needs assistance Sitting-balance support: Feet supported Sitting balance-Leahy Scale: Good     Standing balance support: Bilateral upper extremity supported;During functional activity Standing balance-Leahy Scale: Poor Standing balance comment: use of RW to maintain standing balance                             Pertinent Vitals/Pain Pain Assessment: 0-10 Pain Score: 4  Pain Location: Lt hip Pain Descriptors / Indicators: Sore;Discomfort Pain Intervention(s): Limited activity within patient's tolerance;Monitored during session;Repositioned;Ice applied    Home Living Family/patient expects to be  discharged to:: Private residence Living Arrangements: Alone Available Help at Discharge: Family Type of Home: House Home Access: Stairs to enter Entrance Stairs-Rails:  None Entrance Stairs-Number of Steps: 2 Home Layout: One level Home Equipment: Environmental consultant - 2 wheels;Cane - single point;Bedside commode Additional Comments: son will be staying with pt during recovery    Prior Function Level of Independence: Independent with assistive device(s)         Comments: using cane     Hand Dominance   Dominant Hand: Right    Extremity/Trunk Assessment   Upper Extremity Assessment Upper Extremity Assessment: Overall WFL for tasks assessed    Lower Extremity Assessment Lower Extremity Assessment: LLE deficits/detail LLE Deficits / Details: pt with good Lt quad set strength and 4/5 B sdorsi/plantar flexion strength LLE Sensation: WNL LLE Coordination: WNL    Cervical / Trunk Assessment Cervical / Trunk Assessment: Normal  Communication   Communication: No difficulties  Cognition Arousal/Alertness: Awake/alert Behavior During Therapy: WFL for tasks assessed/performed Overall Cognitive Status: Within Functional Limits for tasks assessed                                        General Comments      Exercises Total Joint Exercises Ankle Circles/Pumps: AROM;Both;20 reps;Seated Quad Sets: AROM;Left;5 reps;Seated   Assessment/Plan    PT Assessment Patient needs continued PT services  PT Problem List Decreased strength;Decreased range of motion;Decreased activity tolerance;Decreased mobility;Decreased balance;Decreased knowledge of use of DME;Pain       PT Treatment Interventions DME instruction;Gait training;Stair training;Functional mobility training;Therapeutic activities;Therapeutic exercise;Balance training;Patient/family education    PT Goals (Current goals can be found in the Care Plan section)  Acute Rehab PT Goals Patient Stated Goal: get back to riding in his slingshot PT Goal Formulation: With patient/family Time For Goal Achievement: 02/18/21 Potential to Achieve Goals: Good    Frequency 7X/week   Barriers to  discharge        Co-evaluation               AM-PAC PT "6 Clicks" Mobility  Outcome Measure Help needed turning from your back to your side while in a flat bed without using bedrails?: A Little Help needed moving from lying on your back to sitting on the side of a flat bed without using bedrails?: A Little Help needed moving to and from a bed to a chair (including a wheelchair)?: A Little Help needed standing up from a chair using your arms (e.g., wheelchair or bedside chair)?: A Little Help needed to walk in hospital room?: A Little Help needed climbing 3-5 steps with a railing? : A Little 6 Click Score: 18    End of Session Equipment Utilized During Treatment: Gait belt Activity Tolerance: Patient tolerated treatment well Patient left: in chair;with call bell/phone within reach;with chair alarm set;with family/visitor present (chair in reclined position to maintain post. hip precautions) Nurse Communication: Mobility status PT Visit Diagnosis: Unsteadiness on feet (R26.81);Muscle weakness (generalized) (M62.81);Pain Pain - Right/Left: Left Pain - part of body: Hip    Time: 1725-1757 PT Time Calculation (min) (ACUTE ONLY): 32 min   Charges:   PT Evaluation $PT Eval Low Complexity: 1 Low PT Treatments $Gait Training: 8-22 mins        Ruchel Brandenburger, SPT  Acute rehab    Leshon Armistead 02/11/2021, 7:02 PM

## 2021-02-11 NOTE — Anesthesia Preprocedure Evaluation (Addendum)
Anesthesia Evaluation  Patient identified by MRN, date of birth, ID band Patient awake    Reviewed: Allergy & Precautions, H&P , NPO status , Patient's Chart, lab work & pertinent test results, reviewed documented beta blocker date and time   Airway Mallampati: II  TM Distance: >3 FB Neck ROM: Full    Dental no notable dental hx. (+) Upper Dentures, Lower Dentures, Dental Advisory Given   Pulmonary former smoker,    Pulmonary exam normal breath sounds clear to auscultation       Cardiovascular hypertension, Pt. on medications and Pt. on home beta blockers + CAD  + dysrhythmias  Rhythm:Regular Rate:Normal     Neuro/Psych negative neurological ROS  negative psych ROS   GI/Hepatic Neg liver ROS, hiatal hernia, GERD  Medicated,  Endo/Other  negative endocrine ROS  Renal/GU negative Renal ROS  negative genitourinary   Musculoskeletal  (+) Arthritis , Osteoarthritis,    Abdominal   Peds  Hematology negative hematology ROS (+)   Anesthesia Other Findings   Reproductive/Obstetrics negative OB ROS                            Anesthesia Physical Anesthesia Plan  ASA: II  Anesthesia Plan: Spinal   Post-op Pain Management:    Induction: Intravenous  PONV Risk Score and Plan: 2 and Propofol infusion and Ondansetron  Airway Management Planned: Simple Face Mask  Additional Equipment:   Intra-op Plan:   Post-operative Plan:   Informed Consent: I have reviewed the patients History and Physical, chart, labs and discussed the procedure including the risks, benefits and alternatives for the proposed anesthesia with the patient or authorized representative who has indicated his/her understanding and acceptance.     Dental advisory given  Plan Discussed with: CRNA  Anesthesia Plan Comments:        Anesthesia Quick Evaluation

## 2021-02-11 NOTE — Transfer of Care (Signed)
Immediate Anesthesia Transfer of Care Note  Patient: Joseph Simpson  Procedure(s) Performed: TOTAL HIP ARTHROPLASTY (Left Hip)  Patient Location: PACU  Anesthesia Type:Spinal  Level of Consciousness: awake, sedated and patient cooperative  Airway & Oxygen Therapy: Patient Spontanous Breathing and Patient connected to face mask oxygen  Post-op Assessment: Report given to RN and Post -op Vital signs reviewed and stable  Post vital signs: stable  Last Vitals:  Vitals Value Taken Time  BP 113/67 02/11/21 1345  Temp 36.4 C 02/11/21 1345  Pulse 41 02/11/21 1357  Resp 12 02/11/21 1357  SpO2 100 % 02/11/21 1357  Vitals shown include unvalidated device data.  Last Pain:  Vitals:   02/11/21 0752  TempSrc: Oral         Complications: No complications documented.

## 2021-02-12 ENCOUNTER — Other Ambulatory Visit: Payer: Self-pay

## 2021-02-12 ENCOUNTER — Encounter (HOSPITAL_COMMUNITY): Payer: Self-pay | Admitting: Orthopedic Surgery

## 2021-02-12 DIAGNOSIS — I2510 Atherosclerotic heart disease of native coronary artery without angina pectoris: Secondary | ICD-10-CM | POA: Diagnosis not present

## 2021-02-12 DIAGNOSIS — Z79899 Other long term (current) drug therapy: Secondary | ICD-10-CM | POA: Diagnosis not present

## 2021-02-12 DIAGNOSIS — I10 Essential (primary) hypertension: Secondary | ICD-10-CM | POA: Diagnosis not present

## 2021-02-12 DIAGNOSIS — M1612 Unilateral primary osteoarthritis, left hip: Secondary | ICD-10-CM | POA: Diagnosis not present

## 2021-02-12 DIAGNOSIS — Z85820 Personal history of malignant melanoma of skin: Secondary | ICD-10-CM | POA: Diagnosis not present

## 2021-02-12 DIAGNOSIS — Z87891 Personal history of nicotine dependence: Secondary | ICD-10-CM | POA: Diagnosis not present

## 2021-02-12 LAB — CBC
HCT: 37.3 % — ABNORMAL LOW (ref 39.0–52.0)
Hemoglobin: 12.6 g/dL — ABNORMAL LOW (ref 13.0–17.0)
MCH: 32.5 pg (ref 26.0–34.0)
MCHC: 33.8 g/dL (ref 30.0–36.0)
MCV: 96.1 fL (ref 80.0–100.0)
Platelets: 147 10*3/uL — ABNORMAL LOW (ref 150–400)
RBC: 3.88 MIL/uL — ABNORMAL LOW (ref 4.22–5.81)
RDW: 12.3 % (ref 11.5–15.5)
WBC: 10.8 10*3/uL — ABNORMAL HIGH (ref 4.0–10.5)
nRBC: 0 % (ref 0.0–0.2)

## 2021-02-12 LAB — BASIC METABOLIC PANEL
Anion gap: 7 (ref 5–15)
BUN: 13 mg/dL (ref 8–23)
CO2: 25 mmol/L (ref 22–32)
Calcium: 8.6 mg/dL — ABNORMAL LOW (ref 8.9–10.3)
Chloride: 107 mmol/L (ref 98–111)
Creatinine, Ser: 0.69 mg/dL (ref 0.61–1.24)
GFR, Estimated: >60 mL/min (ref >60)
Glucose, Bld: 143 mg/dL — ABNORMAL HIGH (ref 70–99)
Potassium: 3.9 mmol/L (ref 3.5–5.1)
Sodium: 139 mmol/L (ref 135–145)

## 2021-02-12 MED ORDER — RIVAROXABAN 10 MG PO TABS: 10.0000 mg | ORAL_TABLET | Freq: Every day | ORAL | 0 refills | Status: DC

## 2021-02-12 MED ORDER — TRAMADOL HCL 50 MG PO TABS: 50.0000 mg | ORAL_TABLET | Freq: Four times a day (QID) | ORAL | 0 refills | Status: DC | PRN

## 2021-02-12 MED ORDER — SENNA-DOCUSATE SODIUM 8.6-50 MG PO TABS: 2.0000 | ORAL_TABLET | Freq: Every day | ORAL | 1 refills | Status: DC

## 2021-02-12 MED ORDER — ONDANSETRON HCL 4 MG PO TABS: 4.0000 mg | ORAL_TABLET | Freq: Three times a day (TID) | ORAL | 0 refills | Status: DC | PRN

## 2021-02-12 NOTE — Progress Notes (Signed)
Subjective: 1 Day Post-Op s/p Procedure(s): TOTAL HIP ARTHROPLASTY   Patient is alert, oriented. Patient reports no pain since 9pm yesterday evening.  Denies chest pain, SOB, Calf pain. No nausea/vomiting. No other complaints.   Objective:  PE: VITALS:   Vitals:   02/11/21 2157 02/12/21 0103 02/12/21 0157 02/12/21 0631  BP: 123/67 121/70 107/62 131/66  Pulse: 69 70 67 70  Resp: 16 16 16 16   Temp: 98 F (36.7 C) 98.1 F (36.7 C) 98.1 F (36.7 C) 97.9 F (36.6 C)  TempSrc: Oral Oral Oral Oral  SpO2: 98% 99% 96% 97%  Weight:      Height:        ABD soft Sensation intact distally Intact pulses distally Dorsiflexion/Plantar flexion intact Incision: no drainage  LABS  Results for orders placed or performed during the hospital encounter of 02/11/21 (from the past 24 hour(s))  CBC     Status: Abnormal   Collection Time: 02/12/21  3:18 AM  Result Value Ref Range   WBC 10.8 (H) 4.0 - 10.5 K/uL   RBC 3.88 (L) 4.22 - 5.81 MIL/uL   Hemoglobin 12.6 (L) 13.0 - 17.0 g/dL   HCT 37.3 (L) 39.0 - 52.0 %   MCV 96.1 80.0 - 100.0 fL   MCH 32.5 26.0 - 34.0 pg   MCHC 33.8 30.0 - 36.0 g/dL   RDW 12.3 11.5 - 15.5 %   Platelets 147 (L) 150 - 400 K/uL   nRBC 0.0 0.0 - 0.2 %  Basic metabolic panel     Status: Abnormal   Collection Time: 02/12/21  3:18 AM  Result Value Ref Range   Sodium 139 135 - 145 mmol/L   Potassium 3.9 3.5 - 5.1 mmol/L   Chloride 107 98 - 111 mmol/L   CO2 25 22 - 32 mmol/L   Glucose, Bld 143 (H) 70 - 99 mg/dL   BUN 13 8 - 23 mg/dL   Creatinine, Ser 0.69 0.61 - 1.24 mg/dL   Calcium 8.6 (L) 8.9 - 10.3 mg/dL   GFR, Estimated >60 >60 mL/min   Anion gap 7 5 - 15    DG Pelvis Portable  Result Date: 02/11/2021 CLINICAL DATA:  Status post left hip replacement. EXAM: DG HIP (WITH OR WITHOUT PELVIS) 1V PORT LEFT; PORTABLE PELVIS 1-2 VIEWS COMPARISON:  None. FINDINGS: AP view of the pelvis with the costo able lateral view of the left hip. Left hip arthroplasty in  expected alignment. No periprosthetic lucency or fracture. Recent postsurgical change includes air and edema in the joint space and soft tissues. IMPRESSION: Left hip arthroplasty without immediate postoperative complication. Electronically Signed   By: Keith Rake M.D.   On: 02/11/2021 16:30   DG Hip Port Unilat With Pelvis 1V Left  Result Date: 02/11/2021 CLINICAL DATA:  Status post left hip replacement. EXAM: DG HIP (WITH OR WITHOUT PELVIS) 1V PORT LEFT; PORTABLE PELVIS 1-2 VIEWS COMPARISON:  None. FINDINGS: AP view of the pelvis with the costo able lateral view of the left hip. Left hip arthroplasty in expected alignment. No periprosthetic lucency or fracture. Recent postsurgical change includes air and edema in the joint space and soft tissues. IMPRESSION: Left hip arthroplasty without immediate postoperative complication. Electronically Signed   By: Keith Rake M.D.   On: 02/11/2021 16:30    Assessment/Plan: Active Problems:   S/P hip replacement, left  1 Day Post-Op s/p Procedure(s): TOTAL HIP ARTHROPLASTY - doing well, up with therapy  Weightbearing: WBAT LLE Insicional and  dressing care: Reinforce dressings as needed VTE prophylaxis: Xarelto x 30 days Pain control: continue current regimen Follow - up plan: 2 weeks with Dr. Mardelle Matte Dispo: ok to discharge home when passes PT  Contact information:   Weekdays 8-5 [address], [place] [phone] A fter hours and holidays please check [email or web address] for group call information for [facility] [place] 02/12/2021, 9:40 AM

## 2021-02-12 NOTE — Plan of Care (Signed)
Plan of care reviewed and discussed with the patient. 

## 2021-02-12 NOTE — TOC Transition Note (Signed)
Transition of Care [facility]) - CM/SW Discharge Note  Patient Details  Name: Joseph Simpson MRN: [id number] Date of Birth: 03/23/1935  [facility] CM/SW Contact:  Sherie Don, LCSW Phone Number: 02/12/2021, 11:15 AM  Clinical Narrative: Patient expected to discharge home today. CSW met with patient to review discharge plan. Per patient, he will discharge home with HHPT thorugh [place] and then transition to OPPT through [place] [facility]. Patient has a rolling walker and shower chair at home. CSW notified [place] with [facility] of patient's discharge. Per Lauretta Chester has [facility] orders. No additional needs identified at this time. TOC signing off.  Final next level of care: [place] Barriers to Discharge: No Barriers Identified  Patient Goals and CMS Choice Patient states their goals for this hospitalization and ongoing recovery are:: Discharge home with HHPT and then transition to [place] CMS Medicare.gov Compare Post Acute Care list provided to:: Patient Choice offered to / list presented to : Patient  Discharge Plan and Services       DME Arranged: N/A DME Agency: NA HH Arranged: PT HH Agency: [facility] Representative spoke with at [place]: [place] prior to surgery  Readmission Risk Interventions No flowsheet data found.

## 2021-02-12 NOTE — Discharge Summary (Signed)
Discharge Summary  Patient ID: Joseph Simpson MRN: [id number] DOB/AGE: 84-May-1936 72 y.o.  Admit date: 02/11/2021 Discharge date: 02/12/2021  Admission Diagnoses:  Left hip osteoarthritis  Discharge Diagnoses:  Active Problems:   S/P hip replacement, left   Past Medical History:  Diagnosis Date  . Abnormal stress test December 2013   s/p cardiac cath with minimal nonobstructive CAD and normal LV function; medical management recommended  . Arthritis   . CAD (coronary artery disease)   . Dyslipidemia   . Dysrhythmia   . GERD (gastroesophageal reflux disease)   . Heart murmur   . History of hiatal hernia   . History of kidney stones   . Hypertension   . Macular degeneration   . Melanoma ([place])    skin  . Obstructive sleep apnea 09/23/2007   Wore cpap previously. Stopped and not interested despite risks.     . OSA (obstructive sleep apnea)   . PAC (premature atrial contraction) 10/25/2015  . PVC's (premature ventricular contractions) 09/27/2013  . SVT (supraventricular tachycardia) ([place])   . Syncope     Surgeries: Procedure(s): TOTAL HIP ARTHROPLASTY on 02/11/2021   Consultants (if any):   Discharged Condition: Improved  Hospital Course: Joseph Simpson is an 86 y.o. male who was admitted 02/11/2021 with a diagnosis of left hip osteoarthritis and went to the operating room on 02/11/2021 and underwent the above named procedures.    He was given perioperative antibiotics:  Anti-infectives (From admission, onward)   Start     Dose/Rate Route Frequency Ordered Stop   02/11/21 1730  ceFAZolin (ANCEF) IVPB 1 g/50 mL premix        1 g 100 mL/hr over 30 Minutes Intravenous Every 6 hours 02/11/21 1659 02/12/21 0140   02/11/21 0745  ceFAZolin (ANCEF) IVPB 2g/100 mL premix        2 g 200 mL/hr over 30 Minutes Intravenous On call to O.R. 02/11/21 0733 02/11/21 1128   02/11/21 0741  ceFAZolin (ANCEF) 2-4 GM/100ML-% IVPB       Note to Pharmacy: Charmayne Sheer   : cabinet override       02/11/21 0741 02/11/21 1959    .  He was given sequential compression devices, early ambulation, and Xarelto for DVT prophylaxis.  He benefited maximally from the hospital stay and there were no complications.    Recent vital signs:  Vitals:   02/12/21 0631 02/12/21 1053  BP: 131/66 108/64  Pulse: 70 66  Resp: 16 15  Temp: 97.9 F (36.6 C) 97.6 F (36.4 C)  SpO2: 97% 95%    Recent laboratory studies:  Lab Results  Component Value Date   HGB 12.6 (L) 02/12/2021   HGB 16.0 01/30/2021   HGB 15.4 12/25/2020   Lab Results  Component Value Date   WBC 10.8 (H) 02/12/2021   PLT 147 (L) 02/12/2021   Lab Results  Component Value Date   INR 1.0 10/18/2012   Lab Results  Component Value Date   NA 139 02/12/2021   K 3.9 02/12/2021   CL 107 02/12/2021   CO2 25 02/12/2021   BUN 13 02/12/2021   CREATININE 0.69 02/12/2021   GLUCOSE 143 (H) 02/12/2021    Discharge Medications:   Allergies as of 02/12/2021      Reactions   Aspirin Other (See Comments)   UPSET STOMACH   Percocet [oxycodone-acetaminophen] Nausea And Vomiting   Vitamins [apatate] Other (See Comments)   Bothers stomach      Medication List  TAKE these medications   acetaminophen 500 MG tablet Commonly known as: TYLENOL Take 500 mg by mouth 2 (two) times daily as needed for moderate pain or headache.   atorvastatin 20 MG tablet Commonly known as: LIPITOR Take 20 mg by mouth every Monday, Wednesday, and Friday.   B-12 COMPLIANCE INJECTION IJ Inject 1 Dose as directed every 30 (thirty) days.   bevacizumab 2.5 mg/0.1 mL Soln Commonly known as: AVASTIN 2.5 mg by Intravitreal route. Injection to eyes every 5 weeks - use with vigamox   Carboxymethylcellulose Sodium 1 % Gel Place 1 drop into both eyes daily.   losartan 100 MG tablet Commonly known as: COZAAR TAKE HALF TABLET BY MOUTH TWICE DAILY What changed:   how much to take  how to take this  when to take this  additional  instructions   metoCLOPramide 5 MG tablet Commonly known as: REGLAN Take 5 mg by mouth 2 (two) times daily.   metoprolol tartrate 25 MG tablet Commonly known as: LOPRESSOR TAKE ONE TABLET BY MOUTH TWICE DAILY   ondansetron 4 MG tablet Commonly known as: Zofran Take 1 tablet (4 mg total) by mouth every 8 (eight) hours as needed for nausea or vomiting.   pantoprazole 40 MG tablet Commonly known as: PROTONIX Take 40 mg by mouth 2 (two) times daily.   polyethylene glycol 17 g packet Commonly known as: MIRALAX / GLYCOLAX Take 17 g by mouth daily as needed for moderate constipation.   REFRESH OPTIVE ADVANCED OP Place 1 drop into both eyes daily.   rivaroxaban 10 MG Tabs tablet Commonly known as: Xarelto Take 1 tablet (10 mg total) by mouth daily.   sennosides-docusate sodium 8.6-50 MG tablet Commonly known as: SENOKOT-S Take 2 tablets by mouth daily.   tamsulosin 0.4 MG Caps capsule Commonly known as: FLOMAX Take 0.4 mg by mouth daily as needed (urinary flow).   traMADol 50 MG tablet Commonly known as: Ultram Take 1 tablet (50 mg total) by mouth every 6 (six) hours as needed.   Vigamox 0.5 % ophthalmic solution Generic drug: moxifloxacin Place 1 drop into both eyes as directed. Take every 5 weeks as directed - use with avastin, instill 1 drop into both eyes 4 times daily for 2 days after eye injections       Diagnostic Studies: DG Pelvis Portable  Result Date: 02/11/2021 CLINICAL DATA:  Status post left hip replacement. EXAM: DG HIP (WITH OR WITHOUT PELVIS) 1V PORT LEFT; PORTABLE PELVIS 1-2 VIEWS COMPARISON:  None. FINDINGS: AP view of the pelvis with the costo able lateral view of the left hip. Left hip arthroplasty in expected alignment. No periprosthetic lucency or fracture. Recent postsurgical change includes air and edema in the joint space and soft tissues. IMPRESSION: Left hip arthroplasty without immediate postoperative complication. Electronically Signed   By:  Keith Rake M.D.   On: 02/11/2021 16:30   DG Hip Port Unilat With Pelvis 1V Left  Result Date: 02/11/2021 CLINICAL DATA:  Status post left hip replacement. EXAM: DG HIP (WITH OR WITHOUT PELVIS) 1V PORT LEFT; PORTABLE PELVIS 1-2 VIEWS COMPARISON:  None. FINDINGS: AP view of the pelvis with the costo able lateral view of the left hip. Left hip arthroplasty in expected alignment. No periprosthetic lucency or fracture. Recent postsurgical change includes air and edema in the joint space and soft tissues. IMPRESSION: Left hip arthroplasty without immediate postoperative complication. Electronically Signed   By: Keith Rake M.D.   On: 02/11/2021 16:30    Disposition: Discharge  disposition: 01-Home or Self Care          Follow-up Information    Marchia Bond, MD. Go on 02/24/2021.   Specialty: Orthopedic Surgery Why: Your appointment is scheduled for 11:30  Contact information: [address] [place] [address] [place], [facility] Follow up.   Specialty: [place] Why: [place] [facility] will provide 5 vists prior to you starting outpatient physical therapy.  Contact information: [address] [place] [address] [facility] Go on 02/24/2021.   Why: Your appointment has been scheduled for 10:00. Please arrive at 9:45 to complete your paperwork  Contact information: [facility] [place] [address] [phone]                Signed: Jola Baptist 02/12/2021, 12:07 PM

## 2021-02-12 NOTE — Discharge Instructions (Signed)
INSTRUCTIONS AFTER JOINT REPLACEMENT   o Remove items at home which could result in a fall. This includes throw rugs or furniture in walking pathways o ICE to the affected joint every three hours while awake for 30 minutes at a time, for at least the first 3-5 days, and then as needed for pain and swelling.  Continue to use ice for pain and swelling. You may notice swelling that will progress down to the foot and ankle.  This is normal after surgery.  Elevate your leg when you are not up walking on it.   o Continue to use the breathing machine you got in the hospital (incentive spirometer) which will help keep your temperature down.  It is common for your temperature to cycle up and down following surgery, especially at night when you are not up moving around and exerting yourself.  The breathing machine keeps your lungs expanded and your temperature down.   DIET:  As you were doing prior to hospitalization, we recommend a well-balanced diet.  DRESSING / WOUND CARE / SHOWERING  You may change your dressing 3-5 days after surgery.  Then change the dressing every day with sterile gauze.  Please use good hand washing techniques before changing the dressing.  Do not use any lotions or creams on the incision until instructed by your surgeon.  ACTIVITY  o Increase activity slowly as tolerated, but follow the weight bearing instructions below.   o No driving for 6 weeks or until further direction given by your physician.  You cannot drive while taking narcotics.  o No lifting or carrying greater than 10 lbs. until further directed by your surgeon. o Avoid periods of inactivity such as sitting longer than an hour when not asleep. This helps prevent blood clots.  o You may return to work once you are authorized by your doctor.     WEIGHT BEARING   Weight bearing as tolerated with assist device (walker, cane, etc) as directed, use it as long as suggested by your surgeon or therapist, typically at  least 4-6 weeks.   EXERCISES  Results after joint replacement surgery are often greatly improved when you follow the exercise, range of motion and muscle strengthening exercises prescribed by your doctor. Safety measures are also important to protect the joint from further injury. Any time any of these exercises cause you to have increased pain or swelling, decrease what you are doing until you are comfortable again and then slowly increase them. If you have problems or questions, call your caregiver or physical therapist for advice.   Rehabilitation is important following a joint replacement. After just a few days of immobilization, the muscles of the leg can become weakened and shrink (atrophy).  These exercises are designed to build up the tone and strength of the thigh and leg muscles and to improve motion. Often times heat used for twenty to thirty minutes before working out will loosen up your tissues and help with improving the range of motion but do not use heat for the first two weeks following surgery (sometimes heat can increase post-operative swelling).   These exercises can be done on a training (exercise) mat, on the floor, on a table or on a bed. Use whatever works the best and is most comfortable for you.    Use music or television while you are exercising so that the exercises are a pleasant break in your day. This will make your life better with the exercises acting as a break   in your routine that you can look forward to.   Perform all exercises about fifteen times, three times per day or as directed.  You should exercise both the operative leg and the other leg as well.  Exercises include:   . Quad Sets - Tighten up the muscle on the front of the thigh (Quad) and hold for 5-10 seconds.   . Straight Leg Raises - With your knee straight (if you were given a brace, keep it on), lift the leg to 60 degrees, hold for 3 seconds, and slowly lower the leg.  Perform this exercise against  resistance later as your leg gets stronger.  . Leg Slides: Lying on your back, slowly slide your foot toward your buttocks, bending your knee up off the floor (only go as far as is comfortable). Then slowly slide your foot back down until your leg is flat on the floor again.  . Angel Wings: Lying on your back spread your legs to the side as far apart as you can without causing discomfort.  . Hamstring Strength:  Lying on your back, push your heel against the floor with your leg straight by tightening up the muscles of your buttocks.  Repeat, but this time bend your knee to a comfortable angle, and push your heel against the floor.  You may put a pillow under the heel to make it more comfortable if necessary.   A rehabilitation program following joint replacement surgery can speed recovery and prevent re-injury in the future due to weakened muscles. Contact your doctor or a physical therapist for more information on knee rehabilitation.    CONSTIPATION  Constipation is defined medically as fewer than three stools per week and severe constipation as less than one stool per week.  Even if you have a regular bowel pattern at home, your normal regimen is likely to be disrupted due to multiple reasons following surgery.  Combination of anesthesia, postoperative narcotics, change in appetite and fluid intake all can affect your bowels.   YOU MUST use at least one of the following options; they are listed in order of increasing strength to get the job done.  They are all available over the counter, and you may need to use some, POSSIBLY even all of these options:    Drink plenty of fluids (prune juice may be helpful) and high fiber foods Colace 100 mg by mouth twice a day  Senokot for constipation as directed and as needed Dulcolax (bisacodyl), take with full glass of water  Miralax (polyethylene glycol) once or twice a day as needed.  If you have tried all these things and are unable to have a bowel  movement in the first 3-4 days after surgery call either your surgeon or your primary doctor.    If you experience loose stools or diarrhea, hold the medications until you stool forms back up.  If your symptoms do not get better within 1 week or if they get worse, check with your doctor.  If you experience "the worst abdominal pain ever" or develop nausea or vomiting, please contact the office immediately for further recommendations for treatment.   ITCHING:  If you experience itching with your medications, try taking only a single pain pill, or even half a pain pill at a time.  You can also use Benadryl over the counter for itching or also to help with sleep.   TED HOSE STOCKINGS:  Use stockings on both legs until for at least 2 weeks or as   directed by physician office. They may be removed at night for sleeping.  MEDICATIONS:  See your medication summary on the "After Visit Summary" that nursing will review with you.  You may have some home medications which will be placed on hold until you complete the course of blood thinner medication.  It is important for you to complete the blood thinner medication as prescribed.  PRECAUTIONS:  If you experience chest pain or shortness of breath - call 911 immediately for transfer to the hospital emergency department.   If you develop a fever greater that 101 F, purulent drainage from wound, increased redness or drainage from wound, foul odor from the wound/dressing, or calf pain - CONTACT YOUR SURGEON.                                                   FOLLOW-UP APPOINTMENTS:  If you do not already have a post-op appointment, please call the office for an appointment to be seen by your surgeon.  Guidelines for how soon to be seen are listed in your "After Visit Summary", but are typically between 1-4 weeks after surgery.  OTHER INSTRUCTIONS:    POST-OPERATIVE OPIOID TAPER INSTRUCTIONS: . It is important to wean off of your opioid medication as soon as  possible. If you do not need pain medication after your surgery it is ok to stop day one. Marland Kitchen Opioids include: o Codeine, Hydrocodone(Norco, Vicodin), Oxycodone(Percocet, oxycontin) and hydromorphone amongst others.  . Long term and even short term use of opiods can cause: o Increased pain response o Dependence o Constipation o Depression o Respiratory depression o And more.  . Withdrawal symptoms can include o Flu like symptoms o Nausea, vomiting o And more . Techniques to manage these symptoms o Hydrate well o Eat regular healthy meals o Stay active o Use relaxation techniques(deep breathing, meditating, yoga) . Do Not substitute Alcohol to help with tapering . If you have been on opioids for less than two weeks and do not have pain than it is ok to stop all together.  . Plan to wean off of opioids o This plan should start within one week post op of your joint replacement. o Maintain the same interval or time between taking each dose and first decrease the dose.  o Cut the total daily intake of opioids by one tablet each day o Next start to increase the time between doses. o The last dose that should be eliminated is the evening dose.     MAKE SURE YOU:  . Understand these instructions.  . Get help right away if you are not doing well or get worse.    Thank you for letting us be a part of your medical care team.  It is a privilege we respect greatly.  We hope these instructions will help you stay on track for a fast and full recovery!   Information on my medicine - XARELTO (Rivaroxaban)  This medication education was reviewed with me or my healthcare representative as part of my discharge preparation.   Why was Xarelto prescribed for you? Xarelto was prescribed for you to reduce the risk of blood clots forming after orthopedic surgery. The medical term for these abnormal blood clots is venous thromboembolism (VTE).  What do you need to know about xarelto ? Take your  Xarelto ONCE DAILY at  the same time every day. You may take it either with or without food.  If you have difficulty swallowing the tablet whole, you may crush it and mix in applesauce just prior to taking your dose.  Take Xarelto exactly as prescribed by your doctor and DO NOT stop taking Xarelto without talking to the doctor who prescribed the medication.  Stopping without other VTE prevention medication to take the place of Xarelto may increase your risk of developing a clot.  After discharge, you should have regular check-up appointments with your healthcare provider that is prescribing your Xarelto.    What do you do if you miss a dose? If you miss a dose, take it as soon as you remember on the same day then continue your regularly scheduled once daily regimen the next day. Do not take two doses of Xarelto on the same day.   Important Safety Information A possible side effect of Xarelto is bleeding. You should call your healthcare provider right away if you experience any of the following: ? Bleeding from an injury or your nose that does not stop. ? Unusual colored urine (red or dark brown) or unusual colored stools (red or black). ? Unusual bruising for unknown reasons. ? A serious fall or if you hit your head (even if there is no bleeding).  Some medicines may interact with Xarelto and might increase your risk of bleeding while on Xarelto. To help avoid this, consult your healthcare provider or pharmacist prior to using any new prescription or non-prescription medications, including herbals, vitamins, non-steroidal anti-inflammatory drugs (NSAIDs) and supplements.  This website has more information on Xarelto: [email or web address]

## 2021-02-12 NOTE — Progress Notes (Signed)
Physical Therapy Treatment Patient Details Name: Joseph Simpson MRN: [id number] DOB: 07/02/1935 Today's Date: 02/12/2021    History of Present Illness Patient is 86 y.o. male s/p Lt THA posterior approach on 02/11/21 with PMH significant for OA, SVT, PVCs, PACs, HTn HLD, CAD, GERD, melanoma, Rt TSA in 2019.    PT Comments    Pt assisted with ambulating in hallway and practicing safe stair technique.  Pt requires increased time for mobility however able to ambulate and perform stairs with min/guard for safety.  Verbally reviewed and cues provided during mobility for posterior hip precautions, and pt reports understanding.  Pt to f/u with HHPT upon d/c.  Pt feels ready to d/c home today.   Follow Up Recommendations  Follow surgeon's recommendation for DC plan and follow-up therapies;Home health PT     Equipment Recommendations  None recommended by PT    Recommendations for Other Services       Precautions / Restrictions Precautions Precautions: Posterior Hip;Fall Precaution Comments: pt unable to recall precautions, verbally reviewed and demonstrated for pt, also provided handout Restrictions Weight Bearing Restrictions: No    Mobility  Bed Mobility Overal bed mobility: Needs Assistance Bed Mobility: Supine to Sit     Supine to sit: Min guard;HOB elevated     General bed mobility comments: reviewed posterior hip precautions prior to mobilizing, cues for technique    Transfers Overall transfer level: Needs assistance Equipment used: Rolling walker (2 wheeled) Transfers: Sit to/from Stand Sit to Stand: Min guard         General transfer comment: min/guard for safety, verbal cues for UE and LE positionign to maintain precautions  Ambulation/Gait Ambulation/Gait assistance: Min guard Gait Distance (Feet): 120 Feet Assistive device: Rolling walker (2 wheeled) Gait Pattern/deviations: Step-to pattern;Decreased stride length;Decreased weight shift to left Gait velocity:  decr   General Gait Details: verbal cues for sequence, rolling RW, maintaining precautions while turning   Stairs Stairs: Yes Stairs assistance: Min guard Stair Management: Step to pattern;Backwards;With walker Number of Stairs: 2 General stair comments: verbal cues for sequence, RW positioning, safety; performed twice; pt reports understanding; pt aware son needs to hold RW for safety at home; provided handout   Wheelchair Mobility    Modified Rankin (Stroke Patients Only)       Balance                                            Cognition Arousal/Alertness: Awake/alert Behavior During Therapy: WFL for tasks assessed/performed Overall Cognitive Status: Within Functional Limits for tasks assessed                                        Exercises      General Comments        Pertinent Vitals/Pain Pain Assessment: 0-10 Pain Score: 5  Pain Location: Lt hip Pain Descriptors / Indicators: Sore;Discomfort Pain Intervention(s): Repositioned;Monitored during session;Ice applied    Home Living                      Prior Function            PT Goals (current goals can now be found in the care plan section) Acute Rehab PT Goals PT Goal Formulation: With patient/family Time For Goal Achievement:  02/18/21 Potential to Achieve Goals: Good Progress towards PT goals: Progressing toward goals    Frequency    7X/week      PT Plan Current plan remains appropriate    Co-evaluation              AM-PAC PT "6 Clicks" Mobility   Outcome Measure  Help needed turning from your back to your side while in a flat bed without using bedrails?: A Little Help needed moving from lying on your back to sitting on the side of a flat bed without using bedrails?: A Little Help needed moving to and from a bed to a chair (including a wheelchair)?: A Little Help needed standing up from a chair using your arms (e.g., wheelchair or  bedside chair)?: A Little Help needed to walk in hospital room?: A Little Help needed climbing 3-5 steps with a railing? : A Little 6 Click Score: 18    End of Session Equipment Utilized During Treatment: Gait belt Activity Tolerance: Patient tolerated treatment well Patient left: in chair;with call bell/phone within reach;with chair alarm set;with family/visitor present Nurse Communication: Mobility status PT Visit Diagnosis: Muscle weakness (generalized) (M62.81);Other abnormalities of gait and mobility (R26.89)     Time: 6219-4712 PT Time Calculation (min) (ACUTE ONLY): 41 min  Charges:  $Gait Training: 38-52 mins                     Kati PT, DPT Acute Rehabilitation Services Pager: [phone] Office: [phone]  Trena Platt 02/12/2021, 12:33 PM

## 2021-02-13 ENCOUNTER — Telehealth: Payer: Self-pay | Admitting: Family Medicine

## 2021-02-13 DIAGNOSIS — Z471 Aftercare following joint replacement surgery: Secondary | ICD-10-CM | POA: Diagnosis not present

## 2021-02-13 DIAGNOSIS — I10 Essential (primary) hypertension: Secondary | ICD-10-CM | POA: Diagnosis not present

## 2021-02-13 DIAGNOSIS — G4733 Obstructive sleep apnea (adult) (pediatric): Secondary | ICD-10-CM | POA: Diagnosis not present

## 2021-02-13 DIAGNOSIS — Z96611 Presence of right artificial shoulder joint: Secondary | ICD-10-CM | POA: Diagnosis not present

## 2021-02-13 DIAGNOSIS — Z85828 Personal history of other malignant neoplasm of skin: Secondary | ICD-10-CM | POA: Diagnosis not present

## 2021-02-13 DIAGNOSIS — E785 Hyperlipidemia, unspecified: Secondary | ICD-10-CM | POA: Diagnosis not present

## 2021-02-13 DIAGNOSIS — I2510 Atherosclerotic heart disease of native coronary artery without angina pectoris: Secondary | ICD-10-CM | POA: Diagnosis not present

## 2021-02-13 DIAGNOSIS — Z79899 Other long term (current) drug therapy: Secondary | ICD-10-CM | POA: Diagnosis not present

## 2021-02-13 DIAGNOSIS — Z96642 Presence of left artificial hip joint: Secondary | ICD-10-CM | POA: Diagnosis not present

## 2021-02-13 DIAGNOSIS — K219 Gastro-esophageal reflux disease without esophagitis: Secondary | ICD-10-CM | POA: Diagnosis not present

## 2021-02-13 DIAGNOSIS — M1990 Unspecified osteoarthritis, unspecified site: Secondary | ICD-10-CM | POA: Diagnosis not present

## 2021-02-13 DIAGNOSIS — H3530 Unspecified macular degeneration: Secondary | ICD-10-CM | POA: Diagnosis not present

## 2021-02-13 DIAGNOSIS — Z9181 History of falling: Secondary | ICD-10-CM | POA: Diagnosis not present

## 2021-02-13 DIAGNOSIS — Z87891 Personal history of nicotine dependence: Secondary | ICD-10-CM | POA: Diagnosis not present

## 2021-02-13 NOTE — Chronic Care Management (AMB) (Signed)
  Chronic Care Management   Note  02/13/2021 Name: Joseph Simpson MRN: [id number] DOB: 06-07-1935  Joseph Simpson is a 86 y.o. year old male who is a primary care patient of Marin Olp, MD. I reached out to [name] by phone today in response to a referral sent by [name] PCP, Marin Olp, MD.   [name] was given information about Chronic Care Management services today including:  1. CCM service includes personalized support from designated clinical staff supervised by his physician, including individualized plan of care and coordination with other care providers 2. 24/7 contact phone numbers for assistance for urgent and routine care needs. 3. Service will only be billed when office clinical staff spend 20 minutes or more in a month to coordinate care. 4. Only one practitioner may furnish and bill the service in a calendar month. 5. The patient may stop CCM services at any time (effective at the end of the month) by phone call to the office staff.   Patient agreed to services and verbal consent obtained.   Follow up plan:   Lauretta Grill [employer] Scheduler

## 2021-02-15 DIAGNOSIS — M1990 Unspecified osteoarthritis, unspecified site: Secondary | ICD-10-CM | POA: Diagnosis not present

## 2021-02-15 DIAGNOSIS — E785 Hyperlipidemia, unspecified: Secondary | ICD-10-CM | POA: Diagnosis not present

## 2021-02-15 DIAGNOSIS — I10 Essential (primary) hypertension: Secondary | ICD-10-CM | POA: Diagnosis not present

## 2021-02-15 DIAGNOSIS — H3530 Unspecified macular degeneration: Secondary | ICD-10-CM | POA: Diagnosis not present

## 2021-02-15 DIAGNOSIS — Z471 Aftercare following joint replacement surgery: Secondary | ICD-10-CM | POA: Diagnosis not present

## 2021-02-15 DIAGNOSIS — I2510 Atherosclerotic heart disease of native coronary artery without angina pectoris: Secondary | ICD-10-CM | POA: Diagnosis not present

## 2021-02-17 ENCOUNTER — Telehealth: Payer: Self-pay

## 2021-02-17 DIAGNOSIS — E785 Hyperlipidemia, unspecified: Secondary | ICD-10-CM | POA: Diagnosis not present

## 2021-02-17 DIAGNOSIS — Z471 Aftercare following joint replacement surgery: Secondary | ICD-10-CM | POA: Diagnosis not present

## 2021-02-17 DIAGNOSIS — M1990 Unspecified osteoarthritis, unspecified site: Secondary | ICD-10-CM | POA: Diagnosis not present

## 2021-02-17 DIAGNOSIS — H3530 Unspecified macular degeneration: Secondary | ICD-10-CM | POA: Diagnosis not present

## 2021-02-17 DIAGNOSIS — I2510 Atherosclerotic heart disease of native coronary artery without angina pectoris: Secondary | ICD-10-CM | POA: Diagnosis not present

## 2021-02-17 DIAGNOSIS — I10 Essential (primary) hypertension: Secondary | ICD-10-CM | POA: Diagnosis not present

## 2021-02-17 NOTE — Chronic Care Management (AMB) (Signed)
Chronic Care Management Pharmacy Assistant   Name: Joseph Simpson  MRN: [id number] DOB: 02-02-1935  Joseph Simpson is an 86 y.o. year old male who presents for his initial CCM visit with the clinical pharmacist.  Reason for Encounter: Initial Call   Conditions to be addressed/monitored: CAD, HTN, HLD, Pulmonary Disease, BPH, GERD, Hyperglycemia and Sleep Apnea.   Recent office visits:  12/25/2020 OV PCP Garret Reddish, MD; currently listed as atorvastatin 40 mg tablet-half tablet three times a week (dizzy on atorvastatin 40 mg weekly)   Recent consult visits:  02/11/2021 (orthopedics) Marchia Bond, MD; Total Hip Arthroplasty; left hip.  Hospital visits:  None in previous 6 months  Medications: Outpatient Encounter Medications as of 02/17/2021  Medication Sig  . acetaminophen (TYLENOL) 500 MG tablet Take 500 mg by mouth 2 (two) times daily as needed for moderate pain or headache.  Marland Kitchen atorvastatin (LIPITOR) 20 MG tablet Take 20 mg by mouth every Monday, Wednesday, and Friday.  . bevacizumab (AVASTIN) 2.5 mg/0.1 mL SOLN 2.5 mg by Intravitreal route. Injection to eyes every 5 weeks - use with vigamox  . Carboxymeth-Glycerin-Polysorb (REFRESH OPTIVE ADVANCED OP) Place 1 drop into both eyes daily.  . Carboxymethylcellulose Sodium 1 % GEL Place 1 drop into both eyes daily.  . Cyanocobalamin (B-12 COMPLIANCE INJECTION IJ) Inject 1 Dose as directed every 30 (thirty) days.  Marland Kitchen losartan (COZAAR) 100 MG tablet TAKE HALF TABLET BY MOUTH TWICE DAILY (Patient taking differently: Take 50 mg by mouth 2 (two) times daily.)  . metoCLOPramide (REGLAN) 5 MG tablet Take 5 mg by mouth 2 (two) times daily.  . metoprolol tartrate (LOPRESSOR) 25 MG tablet TAKE ONE TABLET BY MOUTH TWICE DAILY (Patient taking differently: Take 25 mg by mouth 2 (two) times daily.)  . ondansetron (ZOFRAN) 4 MG tablet Take 1 tablet (4 mg total) by mouth every 8 (eight) hours as needed for nausea or vomiting.  . pantoprazole  (PROTONIX) 40 MG tablet Take 40 mg by mouth 2 (two) times daily.  . polyethylene glycol (MIRALAX / GLYCOLAX) 17 g packet Take 17 g by mouth daily as needed for moderate constipation.  . rivaroxaban (XARELTO) 10 MG TABS tablet Take 1 tablet (10 mg total) by mouth daily.  . sennosides-docusate sodium (SENOKOT-S) 8.6-50 MG tablet Take 2 tablets by mouth daily.  . tamsulosin (FLOMAX) 0.4 MG CAPS capsule Take 0.4 mg by mouth daily as needed (urinary flow).  . traMADol (ULTRAM) 50 MG tablet Take 1 tablet (50 mg total) by mouth every 6 (six) hours as needed.  Marland Kitchen VIGAMOX 0.5 % ophthalmic solution Place 1 drop into both eyes as directed. Take every 5 weeks as directed - use with avastin, instill 1 drop into both eyes 4 times daily for 2 days after eye injections   No facility-administered encounter medications on file as of 02/17/2021.   Any changes in your medications or health? Patient states he recently had his left hip replaced. He states he was given some pain medication and blood thinners to take for 30 days.  Any side effects from any medications?  Patient states he does not have any side effects from any of his medications. Patient states he is taking Atorvastatin 20 mg (whole tablet) 3 times a week and is not having any symptoms or side effects from it.  Do you have any symptoms or problems not managed by your medications? Patient states he does not currently have any problems or symptoms that are not currently managed by  his medications.  Any concerns about your health right now? Patient states he does not currently have any major concerns about his health. He states "if I can get over this hip I will be fine."  Has your provider asked that you check blood pressure, blood sugar, or follow special diet at home? Patient states he does not check his blood sugar but he does monitor his blood pressure at home. Patient states his blood pressure readings average around 120/60 with pulse 70-80.  Do  you get any type of exercise on a regular basis? Patient states he hasn't been able to do much since his hip surgery. He states he does get up and walk through the house.  Can you think of a goal you would like to reach for your health? Patient states he would like to make it to the age of 5.  Do you have any problems getting your medications? Patient states he does not have any problems getting his medications.  Is there anything that you would like to discuss during the appointment?  Patient states he can not think of anything he would like to discuss at this time.  Please bring medications and supplements to appointment.  Future Appointments  Date Time Provider [place]  02/19/2021  9:00 AM [facility] CCM PHARMACIST [facility]  02/25/2021  7:45 AM Hayden Pedro, MD [facility] None  02/26/2021 10:20 AM [place], Peter M, MD [facility]  03/06/2021  9:30 AM [facility] NURSE [facility]  06/27/2021  9:40 AM Marin Olp, MD [facility]  09/04/2021  8:45 AM [facility] HEALTH COACH [facility]     Star Rating Drugs: Atorvastatin 20 mg last filled 12/19/2020 90 DS Losartan Potassium 100 mg last filled 12/19/2020 90 DS  April D Calhoun, [place] Pharmacist Assistant [phone]

## 2021-02-18 NOTE — Progress Notes (Signed)
Chronic Care Management Pharmacy Note  02/19/2021 Name:  Joseph Simpson MRN:  [id number] DOB:  04-20-1935  Subjective: Joseph Simpson is an 86 y.o. year old male who is a primary patient of Hunter, Brayton Mars, MD.  The CCM team was consulted for assistance with disease management and care coordination needs.    Engaged with patient by telephone for initial visit in response to provider referral for pharmacy case management and/or care coordination services.   Consent to Services:  The patient was given the following information about Chronic Care Management services today, agreed to services, and gave verbal consent: 1. CCM service includes personalized support from designated clinical staff supervised by the primary care provider, including individualized plan of care and coordination with other care providers 2. 24/7 contact phone numbers for assistance for urgent and routine care needs. 3. Service will only be billed when office clinical staff spend 20 minutes or more in a month to coordinate care. 4. Only one practitioner may furnish and bill the service in a calendar month. 5.The patient may stop CCM services at any time (effective at the end of the month) by phone call to the office staff. 6. The patient will be responsible for cost sharing (co-pay) of up to 20% of the service fee (after annual deductible is met). Patient agreed to services and consent obtained.  Patient Care Team: Marin Olp, MD as PCP - General (Family Medicine) Martinique, Peter M, MD as PCP - Cardiology (Cardiology) Martinique, Peter M, MD as Consulting Physician (Cardiology) Almedia Balls, MD as Referring Physician (Orthopedic Surgery) Breck Coons, MD as Consulting Physician (Internal Medicine) Jari Pigg, MD as Consulting Physician (Dermatology) Franchot Gallo, MD as Consulting Physician (Urology) Hayden Pedro, MD as Consulting Physician (Ophthalmology) Madelin Rear, [facility] as Pharmacist  (Pharmacist)  Recent office visits:  12/25/2020 OV PCP Garret Reddish, MD; currently listed as atorvastatin 40 mg tablet-half tabletthree timesa week(dizzy on atorvastatin 40 mg weekly)  Recent consult visits:  02/11/2021 (orthopedics) Marchia Bond, MD; Total Hip Arthroplasty; left hip.  Unplanned Hospital visits:  None in previous 6 months  Objective:  Lab Results  Component Value Date   CREATININE 0.69 02/12/2021   CREATININE 0.68 01/30/2021   CREATININE 0.81 12/25/2020    Lab Results  Component Value Date   HGBA1C 5.7 12/25/2020   Last diabetic Eye exam:  Lab Results  Component Value Date/Time   HMDIABEYEEXA No Retinopathy 01/16/2020 12:00 AM    Last diabetic Foot exam: No results found for: HMDIABFOOTEX      Component Value Date/Time   CHOL 125 12/25/2020 1015   TRIG 114.0 12/25/2020 1015   HDL 37.10 (L) 12/25/2020 1015   CHOLHDL 3 12/25/2020 1015   VLDL 22.8 12/25/2020 1015   LDLCALC 65 12/25/2020 1015   LDLDIRECT 83 06/19/2020 1033    Hepatic Function Latest Ref Rng & Units 12/25/2020 06/19/2020 12/21/2019  Total Protein 6.0 - 8.3 g/dL 7.4 7.1 7.4  Albumin 3.5 - 5.2 g/dL 4.5 - 4.7  AST 0 - 37 U/L '17 20 24  ' ALT 0 - 53 U/L '12 19 31  ' Alk Phosphatase 39 - 117 U/L 67 - 77  Total Bilirubin 0.2 - 1.2 mg/dL 1.2 1.0 1.5(H)  Bilirubin, Direct 0.0 - 0.3 mg/dL - - -    Lab Results  Component Value Date/Time   TSH 2.46 08/12/2015 11:27 AM   TSH 2.89 05/27/2015 10:35 AM    CBC Latest Ref Rng & Units 02/12/2021 01/30/2021 12/25/2020  WBC 4.0 - 10.5 K/uL 10.8(H) 5.5 4.6  Hemoglobin 13.0 - 17.0 g/dL 12.6(L) 16.0 15.4  Hematocrit 39.0 - 52.0 % 37.3(L) 48.4 45.3  Platelets 150 - 400 K/uL 147(L) 183 140.0(L)    No results found for: VD25OH  Clinical ASCVD:  The ASCVD Risk score Mikey Bussing DC Jr., et al., 2013) failed to calculate for the following reasons:   The 2013 ASCVD risk score is only valid for ages 56 to 21    Social History   Tobacco Use  Smoking Status  Former Smoker  . Packs/day: 0.10  . Years: 4.00  . Pack years: 0.40  . Types: Cigarettes  . Quit date: 09/03/1956  . Years since quitting: 64.5  Smokeless Tobacco Never Used   BP Readings from Last 3 Encounters:  02/12/21 108/64  01/30/21 (!) 145/72  12/25/20 138/78   Pulse Readings from Last 3 Encounters:  02/12/21 66  01/30/21 66  12/25/20 60   Wt Readings from Last 3 Encounters:  02/11/21 173 lb 1 oz (78.5 kg)  01/30/21 173 lb (78.5 kg)  12/25/20 180 lb 6.4 oz (81.8 kg)    Assessment: Review of patient past medical history, allergies, medications, health status, including review of consultants reports, laboratory and other test data, was performed as part of comprehensive evaluation and provision of chronic care management services.   SDOH:  (Social Determinants of Health) assessments and interventions performed: Yes  CCM Care Plan  Allergies  Allergen Reactions  . Aspirin Other (See Comments)    UPSET STOMACH  . Percocet [Oxycodone-Acetaminophen] Nausea And Vomiting  . Vitamins [Apatate] Other (See Comments)    Bothers stomach    Medications Reviewed Today    Reviewed by Madelin Rear, [facility] (Pharmacist) on 02/19/21 at [place] Status: <None>  Medication Order Taking? Sig Documenting Provider Last Dose Status Informant  acetaminophen (TYLENOL) 500 MG tablet [id number] Yes Take 500 mg by mouth 2 (two) times daily as needed for moderate pain or headache. [provider]  Active Self  atorvastatin (LIPITOR) 20 MG tablet [id number] Yes Take 20 mg by mouth every Monday, Wednesday, and Friday. Martinique, Peter M, MD  Active Self  bevacizumab (AVASTIN) 2.5 mg/0.1 mL SOLN [id number]  2.5 mg by Intravitreal route. Injection to eyes every 5 weeks - use with vigamox [provider]  Active Self           Med Note Willodean Rosenthal Apr 04, 2020  9:29 AM)    Carboxymeth-Glycerin-Polysorb [facility] OPTIVE ADVANCED OP) [id number]  Place 1 drop into both eyes  daily. [provider]  Active Self  Carboxymethylcellulose Sodium 1 % GEL [id number]  Place 1 drop into both eyes daily. [provider]  Active Self  Cyanocobalamin (B-12 COMPLIANCE INJECTION IJ) [id number] Yes Inject 1 Dose as directed every 30 (thirty) days. [provider]  Active Self  losartan (COZAAR) 100 MG tablet [id number] Yes TAKE HALF TABLET BY MOUTH TWICE DAILY  Patient taking differently: Take 50 mg by mouth 2 (two) times daily.   Marin Olp, MD  Active   metoCLOPramide (REGLAN) 5 MG tablet [id number] Yes Take 5 mg by mouth 2 (two) times daily. [provider]  Active Self  metoprolol tartrate (LOPRESSOR) 25 MG tablet [id number] Yes TAKE ONE TABLET BY MOUTH TWICE DAILY  Patient taking differently: Take 25 mg by mouth 2 (two) times daily.   Martinique, Peter M, MD  Active   ondansetron [facility]) 4 MG tablet [id number]  Take 1 tablet (4 mg total) by mouth every 8 (eight) hours as needed for nausea or vomiting. Ventura Bruns, PA-C  Active   pantoprazole (PROTONIX) 40 MG tablet [id number] Yes Take 40 mg by mouth 2 (two) times daily. [provider]  Active Self  polyethylene glycol (MIRALAX / GLYCOLAX) 17 g packet [id number]  Take 17 g by mouth daily as needed for moderate constipation. [provider]  Active Self  rivaroxaban (XARELTO) 10 MG TABS tablet [id number] Yes Take 1 tablet (10 mg total) by mouth daily. Ventura Bruns, PA-C  Active   sennosides-docusate sodium (SENOKOT-S) 8.6-50 MG tablet [id number]  Take 2 tablets by mouth daily. Ventura Bruns, PA-C  Active   tamsulosin [facility]) 0.4 MG CAPS capsule [id number] Yes Take 0.4 mg by mouth daily as needed (urinary flow). Martinique, Peter M, MD  Active Self  traMADol Veat[facility]bon) 50 MG tablet [id number]  Take 1 tablet (50 mg total) by mouth every 6 (six) hours as needed. Ventura Bruns, PA-C  Active   VIGAMOX 0.5 % ophthalmic solution [id number] Yes Place 1 drop into both eyes as directed. Take  every 5 weeks as directed - use with avastin, instill 1 drop into both eyes 4 times daily for 2 days after eye injections [provider]  Active Self           Med Note Larwance Sachs A   Wed Jul 01, 2016 10:46 AM)            Patient Active Problem List   Diagnosis Date Noted  . S/P hip replacement, left 02/11/2021  . Pre-operative clearance 10/28/2020  . Hyperglycemia 12/21/2019  . B12 deficiency 03/09/2019  . Osteoarthritis of left hip 01/30/2019  . 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 Syncope   . History of hiatal hernia   . Gastroparesis 02/15/2017  . Hx of adenomatous colonic polyps 02/15/2017  . Left inguinal hernia 07/12/2016  . Hyperlipidemia 06/10/2016  . PAC (premature atrial contraction) 10/25/2015  . Squamous cell carcinoma in situ 09/09/2015  . BPH (benign prostatic hyperplasia) 11/23/2014  . Palpitations 07/31/2014  . Cough 05/10/2014  . Pulmonary nodule 05/10/2014  . Dyspnea 02/27/2014  . PVC's (premature ventricular contractions) 09/27/2013  . CAD (coronary artery disease)   . Abnormal stress test 10/16/2012  . Murmur 02/10/2012  . Macular degeneration 11/17/2011  . Nephrolithiasis 09/04/2011  . History of melanoma- arm 09/26/2007  . Essential hypertension 09/23/2007  . GERD 09/23/2007  . Obstructive sleep apnea 09/23/2007    Immunization History  Administered Date(s) Administered  . Fluad Quad(high Dose 65+) 07/28/2019, 08/13/2020  . Influenza Split 09/04/2011, 08/19/2012  . Influenza Whole 08/31/2007, 09/16/2008, 10/07/2009, 10/01/2010  . Influenza, High Dose Seasonal PF 08/25/2013, 09/24/2015, 08/27/2016, 09/01/2017, 08/23/2018  . Influenza,inj,Quad PF,6+ Mos 09/05/2014  . Moderna Sars-Covid-2 Vaccination 10/04/2020  . PFIZER(Purple Top)SARS-COV-2 Vaccination 12/25/2019, 01/15/2020  . Pneumococcal Conjugate-13 11/23/2014  . Pneumococcal Polysaccharide-23 10/16/2002  . Td 03/04/1998, 10/09/2008  .  Tdap 07/07/2019  . Zoster 04/28/2011  . Zoster Recombinat (Shingrix) 05/17/2018, 07/19/2018   Conditions to be addressed/monitored: HLD, HTN, Hyperglycemia, CAD, PAC, PVC, OSA, OA, GERD, B12 deficiency  Care Plan : [place]  Updates made by Madelin Rear, [facility] since 02/19/2021 12:00 AM    Problem: HLD, HTN, Hyperglycemia, CAD, PAC, PVC, OSA, OA, GERD, B12 deficiency   Priority: High    Goal: Disease Management   Start Date: 02/19/2021  Expected End Date: 02/19/2022  This  Visit's Progress: On track  Priority: High  Note:   Current Barriers:  . working towards maintaining physical activity as tolerated and diet low in fat/carbs/sodium  Pharmacist Clinical Goal(s):  Marland Kitchen Patient will contact provider office for questions/concerns as evidenced notation of same in electronic health record through collaboration with PharmD and provider.   Interventions: . 1:1 collaboration with Marin Olp, MD regarding development and update of comprehensive plan of care as evidenced by provider attestation and co-signature . Inter-disciplinary care team collaboration (see longitudinal plan of care) . Comprehensive medication review performed; medication list updated in electronic medical record . No medication changes, doing well after left hip replacement 02/11/2021 and is slowly becoming more physically active through [facility] PT  Hypertension (BP goal <130/80) -Controlled -OSA - previously used cpap, not interested in using  -Current treatment: . Losartan 100 mg tablet - 50 mg twice daily (Dr Yong Channel) -Medications previously tried: lisinopril, amlodipine    -Current home readings: 117/61 most recently, ~120/60 on avg -Denies hypotensive/hypertensive symptoms -Educated on Daily salt intake goal < 2300 mg; Importance of home blood pressure monitoring; -Counseled to monitor BP at home once every 1-2 weeks, document, and provide log at future appointments -Recommended to continue current  medication   PAC, PVCs (Goal: minimize side effects) -Controlled -HR has been in 50-60s while at rest  -Current treatment  . Metoprolol tartrate 25 mg tablet twice daily (Dr Martinique) -Denies side effects  -Recommended to continue current medication  Hyperlipidemia: (LDL goal < 70) -Controlled -2013- s/p cardiac cath with minimal nonobstructive CAD. No ASA due to stomach history. -Current treatment: . Atorvastatin 20 mg three times WEEKLY (2/3 90 DS) -Medications previously tried: atorvastatin 10 mg daily, atorvastatin 40 mg once WEEKLY  -Current dietary patterns: no added salt, minimal red meat, eating a lot -Current exercise habits: left hip replacement 02/11/2021. Has been able to walk around the house. Doesn't really due -Educated on Cholesterol goals;  Importance of limiting foods high in cholesterol; -Recommended to continue current medication  GERD, Gastroparesis (Goal: minimize symptoms) Reports Dr Erlene Quan is retiring and will need to get a new GI doctor, preference is to stay with [facility] at this time. Will let us know if wanting to see a [facility] provider.   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 Protonix 40 mg twice daily (Dr Maura Crandall - [facility] - retired last) . Metoclopramide 5 mg twice daily (Dr Maura Crandall [facility]) -Previously on metoclopramide 10 mg twice daiy -Denies side effects or ongoing acid reflux  -Recommended to continue current medication  OA of the left hip (Goal: ensure safe medication use) -Controlled  -s/p hip replacement 02/11/2021. Doing HH PT and will be doing PT in office 02/24/2021. Was discharged on:  Xarelto 10 mg - discharged on 30 days supply   Tramadol 50 mg once every 6 hours as needed   Senokot-S 8.6-50 mg once daily  Counseled on side effects, pain medication use, has not used tramadol in about three days. Tylenol is go-to OTC pain medication, not otherwise  using NSAIDs -Recommended to continue current medication   Patient Goals/Self-Care Activities . Patient will:  - take medications as prescribed engage in dietary modifications by reducing intake of foods high in carbohydrates/fat/salt exercise as tolerated   Follow Up Plan: 5 month BP call, [place] telephone visit in ~6 months  Medication Assistance: None required.  Patient affirms current coverage meets needs.  Patient's preferred  pharmacy is:  [facility] [address], [place] [address] [place] [address] Phone: [phone] Fax: [phone]  Follow Up:  Patient agrees to Care Plan and Follow-up.      Future Appointments  Date Time Provider [place]  02/25/2021  7:45 AM Hayden Pedro, MD T[facility]-TRE None  02/26/2021 10:20 AM Martinique, Peter M, MD [facility]  03/06/2021  9:30 AM [facility] NURSE [facility]  06/27/2021  9:40 AM Marin Olp, MD [facility] PEC  09/02/2021 10:30 AM [facility] CCM PHARMACIST [facility]  09/04/2021  8:45 AM [facility] HEALTH COACH [facility] PEC   Madelin Rear, Pharm.D., BCGP Clinical Pharmacist [place] [phone]

## 2021-02-19 ENCOUNTER — Ambulatory Visit (INDEPENDENT_AMBULATORY_CARE_PROVIDER_SITE_OTHER): Payer: Medicare Other

## 2021-02-19 DIAGNOSIS — E785 Hyperlipidemia, unspecified: Secondary | ICD-10-CM

## 2021-02-19 DIAGNOSIS — M1990 Unspecified osteoarthritis, unspecified site: Secondary | ICD-10-CM | POA: Diagnosis not present

## 2021-02-19 DIAGNOSIS — I10 Essential (primary) hypertension: Secondary | ICD-10-CM | POA: Diagnosis not present

## 2021-02-19 DIAGNOSIS — H3530 Unspecified macular degeneration: Secondary | ICD-10-CM | POA: Diagnosis not present

## 2021-02-19 DIAGNOSIS — I2510 Atherosclerotic heart disease of native coronary artery without angina pectoris: Secondary | ICD-10-CM | POA: Diagnosis not present

## 2021-02-19 DIAGNOSIS — Z471 Aftercare following joint replacement surgery: Secondary | ICD-10-CM | POA: Diagnosis not present

## 2021-02-19 NOTE — Patient Instructions (Addendum)
[name],  Thank you for taking the time to review your medications with me today.  I have included our care plan/goals in the following pages. Please review and call me at [phone] with any questions!  Thanks! Ellin Mayhew, Pharm.D., BCGP Clinical Pharmacist [facility] [place] [phone] Patient Care Plan: [place] Plan    Problem Identified: HLD, HTN, Hyperglycemia, CAD, PAC, PVC, OSA, OA, GERD, B12 deficiency   Priority: High    Goal: Disease Management   Start Date: 02/19/2021  Expected End Date: 02/19/2022  This Visit's Progress: On track  Priority: High  Note:   Current Barriers:  . working towards maintaining physical activity as tolerated and diet low in fat/carbs/sodium  Pharmacist Clinical Goal(s):  Marland Kitchen Patient will contact provider office for questions/concerns as evidenced notation of same in electronic health record through collaboration with PharmD and provider.   Interventions: . 1:1 collaboration with Marin Olp, MD regarding development and update of comprehensive plan of care as evidenced by provider attestation and co-signature . Inter-disciplinary care team collaboration (see longitudinal plan of care) . Comprehensive medication review performed; medication list updated in electronic medical record . No medication changes, doing well after left hip replacement 02/11/2021 and is slowly becoming more physically active through [facility] PT  Hypertension (BP goal <130/80) -Controlled -OSA - previously used cpap, not interested in using  -Current treatment: . Losartan 100 mg tablet - 50 mg twice daily (Dr Yong Channel) -Medications previously tried: lisinopril, amlodipine    -Current home readings: 117/61 most recently, ~120/60 on avg -Denies hypotensive/hypertensive symptoms -Educated on Daily salt intake goal < 2300 mg; Importance of home blood pressure monitoring; -Counseled to monitor BP at home  once every 1-2 weeks, document, and provide log at future appointments -Recommended to continue current medication   PAC, PVCs (Goal: minimize side effects) -Controlled -HR has been in 50-60s while at rest  -Current treatment  . Metoprolol tartrate 25 mg tablet twice daily (Dr Martinique) -Denies side effects  -Recommended to continue current medication  Hyperlipidemia: (LDL goal < 70) -Controlled -2013- s/p cardiac cath with minimal nonobstructive CAD. No ASA due to stomach history. -Current treatment: . Atorvastatin 20 mg three times WEEKLY (2/3 90 DS) -Medications previously tried: atorvastatin 10 mg daily, atorvastatin 40 mg once WEEKLY  -Current dietary patterns: no added salt, minimal red meat, eating a lot -Current exercise habits: left hip replacement 02/11/2021. Has been able to walk around the house. Doesn't really due -Educated on Cholesterol goals;  Importance of limiting foods high in cholesterol; -Recommended to continue current medication  GERD, Gastroparesis (Goal: minimize symptoms) Reports Dr Erlene Quan is retiring and will need to get a new GI doctor, preference is to stay with [facility] at this time. Will let us know if wanting to see a [facility] provider.   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 Protonix 40 mg twice daily (Dr Maura Crandall - [facility] - retired last) . Metoclopramide 5 mg twice daily (Dr Maura Crandall [facility]) -Previously on metoclopramide 10 mg twice daiy -Denies side effects or ongoing acid reflux  -Recommended to continue current medication  OA of the left hip (Goal: ensure safe medication use) -Controlled  -s/p hip replacement 02/11/2021. Doing [facility] PT and will be doing PT in office 02/24/2021. Was discharged on:  Xarelto 10 mg - discharged on 30 days supply   Tramadol 50 mg  once every 6 hours as needed   Senokot-S 8.6-50 mg once daily  Counseled on side effects, pain  medication use, has not used tramadol in about three days. Tylenol is go-to OTC pain medication, not otherwise using NSAIDs -Recommended to continue current medication   Patient Goals/Self-Care Activities . Patient will:  - take medications as prescribed engage in dietary modifications by reducing intake of foods high in carbohydrates/fat/salt exercise as tolerated   Follow Up Plan: 5 month BP call, [place] telephone visit in ~6 months  Medication Assistance: None required.  Patient affirms current coverage meets needs.  Patient's preferred pharmacy is:  [facility] # [address], [place] [address] [place] [address] Phone: [phone] Fax: [phone]  Follow Up:  Patient agrees to Care Plan and Follow-up.      The patient verbalized understanding of instructions provided today and agreed to receive a MyChart copy of patient instruction and/or educational materials. Telephone follow up appointment with pharmacy team member scheduled for: See next appointment with "Care Management Staff" under "What's Next" below.  High Cholesterol  High cholesterol is a condition in which the blood has high levels of a white, waxy substance similar to fat (cholesterol). The liver makes all the cholesterol that the body needs. The human body needs small amounts of cholesterol to help build cells. A person gets extra or excess cholesterol from the food that he or she eats. The blood carries cholesterol from the liver to the rest of the body. If you have high cholesterol, deposits (plaques) may build up on the walls of your arteries. Arteries are the blood vessels that carry blood away from your heart. These plaques make the arteries narrow and stiff. Cholesterol plaques increase your risk for heart attack and stroke. Work with your health care provider to keep your cholesterol levels in a healthy range. What increases the risk? The following factors may make  you more likely to develop this condition:  Eating foods that are high in animal fat (saturated fat) or cholesterol.  Being overweight.  Not getting enough exercise.  A family history of high cholesterol (familial hypercholesterolemia).  Use of tobacco products.  Having diabetes. What are the signs or symptoms? There are no symptoms of this condition. How is this diagnosed? This condition may be diagnosed based on the results of a blood test.  If you are older than 84 years of age, your health care provider may check your cholesterol levels every 4-6 years.  You may be checked more often if you have high cholesterol or other risk factors for heart disease. The blood test for cholesterol measures:  "Bad" cholesterol, or LDL cholesterol. This is the main type of cholesterol that causes heart disease. The desired level is less than 100 mg/dL.  "Good" cholesterol, or HDL cholesterol. HDL helps protect against heart disease by cleaning the arteries and carrying the LDL to the liver for processing. The desired level for HDL is 60 mg/dL or higher.  Triglycerides. These are fats that your body can store or burn for energy. The desired level is less than 150 mg/dL.  Total cholesterol. This measures the total amount of cholesterol in your blood and includes LDL, HDL, and triglycerides. The desired level is less than 200 mg/dL. How is this treated? This condition may be treated with:  Diet changes. You may be asked to eat foods that have more fiber and less saturated fats or added sugar.  Lifestyle changes. These  may include regular exercise, maintaining a healthy weight, and quitting use of tobacco products.  Medicines. These are given when diet and lifestyle changes have not worked. You may be prescribed a statin medicine to help lower your cholesterol levels. Follow these instructions at home: Eating and drinking  Eat a healthy, balanced diet. This diet includes: ? Daily servings of  a variety of fresh, frozen, or canned fruits and vegetables. ? Daily servings of whole grain foods that are rich in fiber. ? Foods that are low in saturated fats and trans fats. These include poultry and fish without skin, lean cuts of meat, and low-fat dairy products. ? A variety of fish, especially oily fish that contain omega-3 fatty acids. Aim to eat fish at least 2 times a week.  Avoid foods and drinks that have added sugar.  Use healthy cooking methods, such as roasting, grilling, broiling, baking, poaching, steaming, and stir-frying. Do not fry your food except for stir-frying.   Lifestyle  Get regular exercise. Aim to exercise for a total of 150 minutes a week. Increase your activity level by doing activities such as gardening, walking, and taking the stairs.  Do not use any products that contain nicotine or tobacco, such as cigarettes, e-cigarettes, and chewing tobacco. If you need help quitting, ask your health care provider.   General instructions  Take over-the-counter and prescription medicines only as told by your health care provider.  Keep all follow-up visits as told by your health care provider. This is important. Where to find more information  American Heart Association: www.heart.org  National Heart, Lung, and Blood Institute: https://wilson-eaton.com/ Contact a health care provider if:  You have trouble achieving or maintaining a healthy diet or weight.  You are starting an exercise program.  You are unable to stop smoking. Get help right away if:  You have chest pain.  You have trouble breathing.  You have any symptoms of a stroke. "BE FAST" is an easy way to remember the main warning signs of a stroke: ? B - Balance. Signs are dizziness, sudden trouble walking, or loss of balance. ? E - Eyes. Signs are trouble seeing or a sudden change in vision. ? F - Face. Signs are sudden weakness or numbness of the face, or the face or eyelid drooping on one side. ? A -  Arms. Signs are weakness or numbness in an arm. This happens suddenly and usually on one side of the body. ? S - Speech. Signs are sudden trouble speaking, slurred speech, or trouble understanding what people say. ? T - Time. Time to call emergency services. Write down what time symptoms started.  You have other signs of a stroke, such as: ? A sudden, severe headache with no known cause. ? Nausea or vomiting. ? Seizure. These symptoms may represent a serious problem that is an emergency. Do not wait to see if the symptoms will go away. Get medical help right away. Call your local emergency services (911 in the [place] Do not drive yourself to the hospital. Summary  Cholesterol plaques increase your risk for heart attack and stroke. Work with your health care provider to keep your cholesterol levels in a healthy range.  Eat a healthy, balanced diet, get regular exercise, and maintain a healthy weight.  Do not use any products that contain nicotine or tobacco, such as cigarettes, e-cigarettes, and chewing tobacco.  Get help right away if you have any symptoms of a stroke. This information is not intended to replace  advice given to you by your health care provider. Make sure you discuss any questions you have with your health care provider. Document Revised: 10/02/2019 Document Reviewed: 10/02/2019 Elsevier Patient Education  2021 [employer].  The patient was given the following information about Chronic Care Management services today, agreed to services, and gave verbal consent: 1. CCM service includes personalized support from designated clinical staff supervised by the primary care provider, including individualized plan of care and coordination with other care providers 2. 24/7 contact phone numbers for assistance for urgent and routine care needs. 3. Service will only be billed when office clinical staff spend 20 minutes or more in a month to coordinate care. 4. Only one practitioner may  furnish and bill the service in a calendar month. 5.The patient may stop CCM services at any time (effective at the end of the month) by phone call to the office staff. 6. The patient will be responsible for cost sharing (co-pay) of up to 20% of the service fee (after annual deductible is met). Patient agreed to services and consent obtained.

## 2021-02-20 DIAGNOSIS — M1990 Unspecified osteoarthritis, unspecified site: Secondary | ICD-10-CM | POA: Diagnosis not present

## 2021-02-20 DIAGNOSIS — I2510 Atherosclerotic heart disease of native coronary artery without angina pectoris: Secondary | ICD-10-CM | POA: Diagnosis not present

## 2021-02-20 DIAGNOSIS — H3530 Unspecified macular degeneration: Secondary | ICD-10-CM | POA: Diagnosis not present

## 2021-02-20 DIAGNOSIS — I10 Essential (primary) hypertension: Secondary | ICD-10-CM | POA: Diagnosis not present

## 2021-02-20 DIAGNOSIS — Z471 Aftercare following joint replacement surgery: Secondary | ICD-10-CM | POA: Diagnosis not present

## 2021-02-20 DIAGNOSIS — E785 Hyperlipidemia, unspecified: Secondary | ICD-10-CM | POA: Diagnosis not present

## 2021-02-24 DIAGNOSIS — M1611 Unilateral primary osteoarthritis, right hip: Secondary | ICD-10-CM | POA: Diagnosis not present

## 2021-02-24 DIAGNOSIS — M1612 Unilateral primary osteoarthritis, left hip: Secondary | ICD-10-CM | POA: Diagnosis not present

## 2021-02-24 DIAGNOSIS — M6281 Muscle weakness (generalized): Secondary | ICD-10-CM | POA: Diagnosis not present

## 2021-02-24 DIAGNOSIS — R262 Difficulty in walking, not elsewhere classified: Secondary | ICD-10-CM | POA: Diagnosis not present

## 2021-02-24 DIAGNOSIS — M25612 Stiffness of left shoulder, not elsewhere classified: Secondary | ICD-10-CM | POA: Diagnosis not present

## 2021-02-25 ENCOUNTER — Encounter (INDEPENDENT_AMBULATORY_CARE_PROVIDER_SITE_OTHER): Payer: Medicare Other | Admitting: Ophthalmology

## 2021-02-25 ENCOUNTER — Other Ambulatory Visit: Payer: Self-pay

## 2021-02-25 DIAGNOSIS — H353231 Exudative age-related macular degeneration, bilateral, with active choroidal neovascularization: Secondary | ICD-10-CM | POA: Diagnosis not present

## 2021-02-25 DIAGNOSIS — H35033 Hypertensive retinopathy, bilateral: Secondary | ICD-10-CM | POA: Diagnosis not present

## 2021-02-25 DIAGNOSIS — I10 Essential (primary) hypertension: Secondary | ICD-10-CM | POA: Diagnosis not present

## 2021-02-25 DIAGNOSIS — H43813 Vitreous degeneration, bilateral: Secondary | ICD-10-CM

## 2021-02-25 DIAGNOSIS — D3132 Benign neoplasm of left choroid: Secondary | ICD-10-CM | POA: Diagnosis not present

## 2021-02-26 ENCOUNTER — Other Ambulatory Visit: Payer: Self-pay | Admitting: Cardiology

## 2021-02-26 ENCOUNTER — Other Ambulatory Visit: Payer: Self-pay

## 2021-02-26 ENCOUNTER — Ambulatory Visit: Payer: Medicare Other | Admitting: Cardiology

## 2021-02-26 DIAGNOSIS — E785 Hyperlipidemia, unspecified: Secondary | ICD-10-CM

## 2021-02-26 DIAGNOSIS — I10 Essential (primary) hypertension: Secondary | ICD-10-CM

## 2021-03-04 DIAGNOSIS — N401 Enlarged prostate with lower urinary tract symptoms: Secondary | ICD-10-CM | POA: Diagnosis not present

## 2021-03-04 DIAGNOSIS — R350 Frequency of micturition: Secondary | ICD-10-CM | POA: Diagnosis not present

## 2021-03-05 DIAGNOSIS — R262 Difficulty in walking, not elsewhere classified: Secondary | ICD-10-CM | POA: Diagnosis not present

## 2021-03-05 DIAGNOSIS — M6281 Muscle weakness (generalized): Secondary | ICD-10-CM | POA: Diagnosis not present

## 2021-03-05 DIAGNOSIS — M25552 Pain in left hip: Secondary | ICD-10-CM | POA: Diagnosis not present

## 2021-03-05 DIAGNOSIS — M1612 Unilateral primary osteoarthritis, left hip: Secondary | ICD-10-CM | POA: Diagnosis not present

## 2021-03-06 ENCOUNTER — Other Ambulatory Visit: Payer: Self-pay

## 2021-03-06 ENCOUNTER — Ambulatory Visit (INDEPENDENT_AMBULATORY_CARE_PROVIDER_SITE_OTHER): Payer: Medicare Other

## 2021-03-06 DIAGNOSIS — E538 Deficiency of other specified B group vitamins: Secondary | ICD-10-CM | POA: Diagnosis not present

## 2021-03-06 MED ORDER — CYANOCOBALAMIN 1000 MCG/ML IJ SOLN
1000.0000 ug | Freq: Once | INTRAMUSCULAR | Status: AC
  Administered 2021-03-06: 1000 ug via INTRAMUSCULAR

## 2021-03-06 NOTE — Progress Notes (Signed)
Joseph Simpson 86 yr old male presents to office today for monthly B12 injection per Garret Reddish, MD. Administered CYANOCOBALAMIN 1,000 mcg IM left arm. Patient tolerated well.

## 2021-03-12 DIAGNOSIS — R262 Difficulty in walking, not elsewhere classified: Secondary | ICD-10-CM | POA: Diagnosis not present

## 2021-03-12 DIAGNOSIS — M25552 Pain in left hip: Secondary | ICD-10-CM | POA: Diagnosis not present

## 2021-03-12 DIAGNOSIS — M1612 Unilateral primary osteoarthritis, left hip: Secondary | ICD-10-CM | POA: Diagnosis not present

## 2021-03-12 DIAGNOSIS — M6281 Muscle weakness (generalized): Secondary | ICD-10-CM | POA: Diagnosis not present

## 2021-03-17 ENCOUNTER — Ambulatory Visit (INDEPENDENT_AMBULATORY_CARE_PROVIDER_SITE_OTHER): Payer: Medicare Other | Admitting: Family Medicine

## 2021-03-17 ENCOUNTER — Encounter: Payer: Self-pay | Admitting: Family Medicine

## 2021-03-17 ENCOUNTER — Other Ambulatory Visit: Payer: Self-pay

## 2021-03-17 VITALS — BP 128/60 | HR 60 | Temp 98.2°F | Ht 71.0 in | Wt 171.2 lb

## 2021-03-17 DIAGNOSIS — W57XXXA Bitten or stung by nonvenomous insect and other nonvenomous arthropods, initial encounter: Secondary | ICD-10-CM

## 2021-03-17 DIAGNOSIS — S80861A Insect bite (nonvenomous), right lower leg, initial encounter: Secondary | ICD-10-CM | POA: Diagnosis not present

## 2021-03-17 MED ORDER — MUPIROCIN 2 % EX OINT: 1.0000 "application " | TOPICAL_OINTMENT | Freq: Three times a day (TID) | CUTANEOUS | 0 refills | Status: DC

## 2021-03-17 NOTE — Progress Notes (Signed)
Patient: Joseph Simpson MRN: [id number] DOB: 1934/11/28 PCP: Marin Olp, MD     Subjective:  Chief Complaint  Patient presents with  . Insect Bite    HPI: The patient is a 86 y.o. male who presents today for insect bite. He states he felt some itching on his right ankle and pulled his sock down and saw a bug and swiped it off his leg. It came off easily and he didn't verify what kind of insect it was. He states it started to get worse over the weekend with leg swelling and the bite getting bigger and bigger. It started off as a little bump and then has gotten bigger and has a black scab over it.   He was over in some grass and weeds by his barn the day before he noticed the bite. No fever/chills, loss of appetite. No traveling. It wasn't on him very long (he thinks less than a day).   He was also on lovenox up until this past Friday. UTD on tetanus  Review of Systems  Constitutional: Negative for chills and fever.  Respiratory: Negative for shortness of breath.   Cardiovascular: Negative for chest pain and leg swelling.  Skin: Positive for color change and wound.    Allergies Patient is allergic to aspirin, percocet [oxycodone-acetaminophen], and vitamins [apatate].  Past Medical History Patient  has a past medical history of Abnormal stress test (December 2013), Arthritis, CAD (coronary artery disease), Dyslipidemia, Dysrhythmia, GERD (gastroesophageal reflux disease), Heart murmur, History of hiatal hernia, History of kidney stones, Hypertension, Macular degeneration, Melanoma ([place]), Obstructive sleep apnea (09/23/2007), OSA (obstructive sleep apnea), PAC (premature atrial contraction) (10/25/2015), PVC's (premature ventricular contractions) (09/27/2013), SVT (supraventricular tachycardia) ([place]), and Syncope.  Surgical History Patient  has a past surgical history that includes Gastrectomy; Hemorrhoid surgery; Melanoma excision (05-05-2007); Nose surgery; Cataract extraction;  Tonsillectomy; Appendectomy; Cystoscopy/retrograde/ureteroscopy/stone extraction with basket (02/24/2012); Cardiac catheterization (December 2013); Inguinal hernia repair (Left, 07/13/2016); Insertion of mesh (Left, 07/13/2016); Cystoscopy with litholapaxy (N/A, 07/13/2016); Hernia repair (07/13/2016); Total shoulder arthroplasty (Right, 12/29/2017); Total shoulder arthroplasty (Right, 12/29/2017); and Total hip arthroplasty (Left, 02/11/2021).  Family History Pateint's family history includes Cancer in his sister; Heart disease in his mother; Lung cancer in his brother; Pneumonia in his father.  Social History Patient  reports that he quit smoking about 64 years ago. His smoking use included cigarettes. He has a 0.40 pack-year smoking history. He has never used smokeless tobacco. He reports that he does not drink alcohol and does not use drugs.    Objective: Vitals:   03/17/21 1422  BP: 128/60  Pulse: 60  Temp: 98.2 F (36.8 C)  TempSrc: Temporal  SpO2: 96%  Weight: 171 lb 3.2 oz (77.7 kg)  Height: 5\' 11"  (1.803 m)    Body mass index is 23.88 kg/m.  Physical Exam Vitals reviewed.  Constitutional:      General: He is not in acute distress.    Appearance: Normal appearance. He is normal weight. He is not ill-appearing.  HENT:     Head: Normocephalic and atraumatic.  Pulmonary:     Effort: Pulmonary effort is normal.  Skin:    Findings: Lesion (right medial ankle is a .5cm or less raised lesion with small hematoma. no surrounding erythema or edema. do not see head of tick or other foreign lesion. ) present.  Neurological:     General: No focal deficit present.     Mental Status: He is alert and oriented to person, place, and  time.   verbal consent obtained to clean area and use forceps.  area cleaned with alcohol. Sterile forceps used to make sure no tick head, minimal dry blood expressed.     Assessment/plan: 1. Insect bite of right lower leg, initial encounter Bite with very  small hematoma likely from being on lovenox. No signs of cellulitis or infection today. Sending in [place] to use TID. Soap and water. Precautions given for infection/cellulitis.    Return if symptoms worsen or fail to improve.   [name], MD [place]   03/17/2021

## 2021-03-17 NOTE — Patient Instructions (Signed)
-looks like you had some bruising/bleeding after bug bit you under skin. No signs of infection.  I sent in antibiotic cream for you to put on your bite three times a day.   Please let me know if it starts to get red, spread, swollen or any fever/chills.     Insect Bite, Adult An insect bite can make your skin red, itchy, and swollen. Some insects can spread disease to people with a bite. However, most insect bites do not lead to disease, and most are not serious. What are the causes? Insects may bite for many reasons, including:  Hunger.  To defend themselves. Insects that bite include:  Spiders.  Mosquitoes.  Ticks.  Fleas.  Ants.  Flies.  Kissing bugs.  Chiggers. What are the signs or symptoms? Symptoms of this condition include:  Itching or pain in the bite area.  Redness and swelling in the bite area.  An open wound (skin ulcer). Symptoms often last for 2-4 days. In rare cases, a person may have a very bad allergic reaction (anaphylactic reaction) to a bite. Symptoms of an anaphylactic reaction may include:  Feeling warm in the face (flushed). Your face may turn red.  Itchy, red, swollen areas of skin (hives).  Swelling of the: ? Eyes. ? Lips. ? Face. ? Mouth. ? Tongue. ? Throat.  Trouble with any of these: ? Breathing. ? Talking. ? Swallowing.  Loud breathing (wheezing).  Feeling dizzy or light-headed.  Passing out (fainting).  Pain or cramps in your belly.  Throwing up (vomiting).  Watery poop (diarrhea). How is this treated? Treatment is usually not needed. Symptoms often go away on their own. When treatment is needed, it may involve:  Putting a cream or lotion on the bite area. This helps with itching.  Taking an antibiotic medicine. This treatment is needed if the bite area gets infected.  Getting a tetanus shot, if you are not up to date on this vaccine.  Putting ice on the affected area.  Using medicines called  antihistamines. This treatment may be needed if you have itching or an allergic reaction to the insect bite.  Giving yourself a shot of medicine (epinephrine) using an auto-injector "pen" if you have an anaphylactic reaction to a bite. Your doctor will teach you how to use this pen. Follow these instructions at home: Bite area care  Do not scratch the bite area.  Keep the bite area clean and dry.  Wash the bite area every day with soap and water as told by your doctor.  Check the bite area every day for signs of infection. Check for: ? Redness, swelling, or pain. ? Fluid or blood. ? Warmth. ? Pus or a bad smell.   Managing pain, itching, and swelling  You may put any of these on the bite area as told by your doctor: ? A paste made of baking soda and water. ? Cortisone cream. ? Calamine lotion.  If told, put ice on the bite area. ? Put ice in a plastic bag. ? Place a towel between your skin and the bag. ? Leave the ice on for 20 minutes, 2-3 times a day.   General instructions  Apply or take over-the-counter and prescription medicines only as told by your doctor.  If you were prescribed an antibiotic medicine, take or apply it as told by your doctor. Do not stop using the antibiotic even if your condition improves.  Keep all follow-up visits as told by your doctor. This is  important. How is this prevented? To help you have a lower risk of insect bites:  When you are outside, wear clothing that covers your arms and legs.  Use insect repellent. The best insect repellents contain one of these: ? DEET. ? Picaridin. ? Oil of lemon eucalyptus (OLE). ? IR3535.  Consider spraying your clothing with a pesticide called permethrin. Permethrin helps prevent insect bites. It works for several weeks and for up to 5-6 clothing washes. Do not apply permethrin directly to the skin.  If your home windows do not have screens, think about putting some in.  If you will be sleeping in an  area where there are mosquitoes, consider covering your sleeping area with a mosquito net. Contact a doctor if:  You have redness, swelling, or pain in the bite area.  You have fluid or blood coming from the bite area.  The bite area feels warm to the touch.  You have pus or a bad smell coming from the bite area.  You have a fever. Get help right away if:  You have joint pain.  You have a rash.  You feel more tired or sleepy than you normally do.  You have neck pain.  You have a headache.  You feel weaker than you normally do.  You have signs of an anaphylactic reaction. Signs may include: ? Feeling warm in the face. ? Itchy, red, swollen areas of skin. ? Swelling of your:  Eyes.  Lips.  Face.  Mouth.  Tongue.  Throat. ? Trouble with any of these:  Breathing.  Talking.  Swallowing. ? Loud breathing. ? Feeling dizzy or light-headed. ? Passing out. ? Pain or cramps in your belly. ? Throwing up. ? Watery poop. These symptoms may be an emergency. Do not wait to see if the symptoms will go away. Do this right away:  Use your auto-injector pen as you have been told.  Get medical help. Call your local emergency services (911 in the [place] Do not drive yourself to the hospital. Summary  An insect bite can make your skin red, itchy, and swollen.  Treatment is usually not needed. Symptoms often go away on their own.  Do not scratch the bite area. Keep it clean and dry.  Ice can help with pain and itching from the bite. This information is not intended to replace advice given to you by your health care provider. Make sure you discuss any questions you have with your health care provider. Document Revised: 09/24/2020 Document Reviewed: 05/13/2018 Elsevier Patient Education  2021 [employer].

## 2021-03-18 DIAGNOSIS — R3121 Asymptomatic microscopic hematuria: Secondary | ICD-10-CM | POA: Diagnosis not present

## 2021-03-18 DIAGNOSIS — R350 Frequency of micturition: Secondary | ICD-10-CM | POA: Diagnosis not present

## 2021-03-19 DIAGNOSIS — R262 Difficulty in walking, not elsewhere classified: Secondary | ICD-10-CM | POA: Diagnosis not present

## 2021-03-19 DIAGNOSIS — M6281 Muscle weakness (generalized): Secondary | ICD-10-CM | POA: Diagnosis not present

## 2021-03-19 DIAGNOSIS — M1612 Unilateral primary osteoarthritis, left hip: Secondary | ICD-10-CM | POA: Diagnosis not present

## 2021-03-19 DIAGNOSIS — M25552 Pain in left hip: Secondary | ICD-10-CM | POA: Diagnosis not present

## 2021-03-21 DIAGNOSIS — D225 Melanocytic nevi of trunk: Secondary | ICD-10-CM | POA: Diagnosis not present

## 2021-03-21 DIAGNOSIS — D2230 Melanocytic nevi of unspecified part of face: Secondary | ICD-10-CM | POA: Diagnosis not present

## 2021-03-21 DIAGNOSIS — L570 Actinic keratosis: Secondary | ICD-10-CM | POA: Diagnosis not present

## 2021-03-21 DIAGNOSIS — L821 Other seborrheic keratosis: Secondary | ICD-10-CM | POA: Diagnosis not present

## 2021-03-21 DIAGNOSIS — L578 Other skin changes due to chronic exposure to nonionizing radiation: Secondary | ICD-10-CM | POA: Diagnosis not present

## 2021-03-21 DIAGNOSIS — Z87898 Personal history of other specified conditions: Secondary | ICD-10-CM | POA: Diagnosis not present

## 2021-03-21 DIAGNOSIS — D2271 Melanocytic nevi of right lower limb, including hip: Secondary | ICD-10-CM | POA: Diagnosis not present

## 2021-03-21 DIAGNOSIS — L814 Other melanin hyperpigmentation: Secondary | ICD-10-CM | POA: Diagnosis not present

## 2021-03-21 DIAGNOSIS — Z85828 Personal history of other malignant neoplasm of skin: Secondary | ICD-10-CM | POA: Diagnosis not present

## 2021-03-24 DIAGNOSIS — M1612 Unilateral primary osteoarthritis, left hip: Secondary | ICD-10-CM | POA: Diagnosis not present

## 2021-03-25 ENCOUNTER — Other Ambulatory Visit: Payer: Self-pay

## 2021-03-25 ENCOUNTER — Encounter (INDEPENDENT_AMBULATORY_CARE_PROVIDER_SITE_OTHER): Payer: Medicare Other | Admitting: Ophthalmology

## 2021-03-25 DIAGNOSIS — H353231 Exudative age-related macular degeneration, bilateral, with active choroidal neovascularization: Secondary | ICD-10-CM | POA: Diagnosis not present

## 2021-03-25 DIAGNOSIS — D3132 Benign neoplasm of left choroid: Secondary | ICD-10-CM

## 2021-03-25 DIAGNOSIS — H33301 Unspecified retinal break, right eye: Secondary | ICD-10-CM

## 2021-03-25 DIAGNOSIS — H43813 Vitreous degeneration, bilateral: Secondary | ICD-10-CM | POA: Diagnosis not present

## 2021-03-25 DIAGNOSIS — H35033 Hypertensive retinopathy, bilateral: Secondary | ICD-10-CM | POA: Diagnosis not present

## 2021-03-25 DIAGNOSIS — I10 Essential (primary) hypertension: Secondary | ICD-10-CM | POA: Diagnosis not present

## 2021-03-26 ENCOUNTER — Other Ambulatory Visit: Payer: Self-pay | Admitting: Family Medicine

## 2021-03-26 DIAGNOSIS — R262 Difficulty in walking, not elsewhere classified: Secondary | ICD-10-CM | POA: Diagnosis not present

## 2021-03-26 DIAGNOSIS — M6281 Muscle weakness (generalized): Secondary | ICD-10-CM | POA: Diagnosis not present

## 2021-03-26 DIAGNOSIS — M1612 Unilateral primary osteoarthritis, left hip: Secondary | ICD-10-CM | POA: Diagnosis not present

## 2021-03-26 DIAGNOSIS — M25552 Pain in left hip: Secondary | ICD-10-CM | POA: Diagnosis not present

## 2021-04-02 DIAGNOSIS — R262 Difficulty in walking, not elsewhere classified: Secondary | ICD-10-CM | POA: Diagnosis not present

## 2021-04-02 DIAGNOSIS — M6281 Muscle weakness (generalized): Secondary | ICD-10-CM | POA: Diagnosis not present

## 2021-04-02 DIAGNOSIS — M25552 Pain in left hip: Secondary | ICD-10-CM | POA: Diagnosis not present

## 2021-04-02 DIAGNOSIS — M1612 Unilateral primary osteoarthritis, left hip: Secondary | ICD-10-CM | POA: Diagnosis not present

## 2021-04-08 ENCOUNTER — Ambulatory Visit (INDEPENDENT_AMBULATORY_CARE_PROVIDER_SITE_OTHER): Payer: Medicare Other | Admitting: *Deleted

## 2021-04-08 ENCOUNTER — Encounter: Payer: Self-pay | Admitting: *Deleted

## 2021-04-08 ENCOUNTER — Other Ambulatory Visit: Payer: Self-pay

## 2021-04-08 DIAGNOSIS — E538 Deficiency of other specified B group vitamins: Secondary | ICD-10-CM | POA: Diagnosis not present

## 2021-04-08 MED ORDER — CYANOCOBALAMIN 1000 MCG/ML IJ SOLN
1000.0000 ug | Freq: Once | INTRAMUSCULAR | Status: AC
  Administered 2021-04-08: 1000 ug via INTRAMUSCULAR

## 2021-04-08 NOTE — Progress Notes (Signed)
I have reviewed and agree with note, evaluation, plan.   Yaa Donnellan, MD  

## 2021-04-08 NOTE — Progress Notes (Signed)
Per orders of Dr. Hunter, injection of Cyanocobalamin 1000 mcg/ml IM given by Kimbery Harwood, LPN in right deltoid. Patient tolerated injection well. Patient will make appointment for 1 month   

## 2021-04-22 ENCOUNTER — Other Ambulatory Visit: Payer: Self-pay

## 2021-04-22 ENCOUNTER — Encounter (INDEPENDENT_AMBULATORY_CARE_PROVIDER_SITE_OTHER): Payer: Medicare Other | Admitting: Ophthalmology

## 2021-04-22 DIAGNOSIS — D3132 Benign neoplasm of left choroid: Secondary | ICD-10-CM

## 2021-04-22 DIAGNOSIS — H35033 Hypertensive retinopathy, bilateral: Secondary | ICD-10-CM | POA: Diagnosis not present

## 2021-04-22 DIAGNOSIS — H33301 Unspecified retinal break, right eye: Secondary | ICD-10-CM | POA: Diagnosis not present

## 2021-04-22 DIAGNOSIS — I10 Essential (primary) hypertension: Secondary | ICD-10-CM

## 2021-04-22 DIAGNOSIS — H353231 Exudative age-related macular degeneration, bilateral, with active choroidal neovascularization: Secondary | ICD-10-CM

## 2021-04-22 DIAGNOSIS — H43813 Vitreous degeneration, bilateral: Secondary | ICD-10-CM | POA: Diagnosis not present

## 2021-05-01 ENCOUNTER — Telehealth: Payer: Self-pay

## 2021-05-01 NOTE — Chronic Care Management (AMB) (Signed)
Chronic Care Management Pharmacy Assistant   Name: Joseph Simpson  MRN: [id number] DOB: 10-01-1935  Reason for Encounter: Hypertension Disease State Call  Recent office visits:  03/17/21- Joseph Flaming, MD- seen for insect bite of right lower leg, started mupirocin 2% tid, no follow up documented   Recent consult visits:  No visits noted  Hospital visits:  None in previous 6 months  Medications: Outpatient Encounter Medications as of 05/01/2021  Medication Sig Note   acetaminophen (TYLENOL) 500 MG tablet Take 500 mg by mouth 2 (two) times daily as needed for moderate pain or headache.    atorvastatin (LIPITOR) 20 MG tablet Take 20 mg by mouth every Monday, Wednesday, and Friday.    bevacizumab (AVASTIN) 2.5 mg/0.1 mL SOLN 2.5 mg by Intravitreal route. Injection to eyes every 5 weeks - use with vigamox    Carboxymeth-Glycerin-Polysorb (REFRESH OPTIVE ADVANCED OP) Place 1 drop into both eyes daily.    Carboxymethylcellulose Sodium 1 % GEL Place 1 drop into both eyes daily.    Cyanocobalamin (B-12 COMPLIANCE INJECTION IJ) Inject 1 Dose as directed every 30 (thirty) days.    enoxaparin (LOVENOX) 40 MG/0.4ML injection  03/17/2021: Pre surgery-Hip   losartan (COZAAR) 100 MG tablet TAKE HALF TABLET BY MOUTH TWICE DAILY    metoCLOPramide (REGLAN) 5 MG tablet Take 5 mg by mouth 2 (two) times daily.    metoprolol tartrate (LOPRESSOR) 25 MG tablet TAKE ONE TABLET BY MOUTH TWICE DAILY    mupirocin ointment (BACTROBAN) 2 % Apply 1 application topically 3 (three) times daily.    ondansetron (ZOFRAN) 4 MG tablet Take 1 tablet (4 mg total) by mouth every 8 (eight) hours as needed for nausea or vomiting.    pantoprazole (PROTONIX) 40 MG tablet Take 40 mg by mouth 2 (two) times daily.    polyethylene glycol (MIRALAX / GLYCOLAX) 17 g packet Take 17 g by mouth daily as needed for moderate constipation.    rivaroxaban (XARELTO) 10 MG TABS tablet Take 1 tablet (10 mg total) by mouth daily.     sennosides-docusate sodium (SENOKOT-S) 8.6-50 MG tablet Take 2 tablets by mouth daily.    tamsulosin (FLOMAX) 0.4 MG CAPS capsule Take 0.4 mg by mouth daily as needed (urinary flow).    traMADol (ULTRAM) 50 MG tablet Take 1 tablet (50 mg total) by mouth every 6 (six) hours as needed.    VIGAMOX 0.5 % ophthalmic solution Place 1 drop into both eyes as directed. Take every 5 weeks as directed - use with avastin, instill 1 drop into both eyes 4 times daily for 2 days after eye injections    No facility-administered encounter medications on file as of 05/01/2021.   Reviewed chart prior to disease state call. Spoke with patient regarding BP  Recent Office Vitals: BP Readings from Last 3 Encounters:  03/17/21 128/60  02/12/21 108/64  01/30/21 (!) 145/72   Pulse Readings from Last 3 Encounters:  03/17/21 60  02/12/21 66  01/30/21 66    Wt Readings from Last 3 Encounters:  03/17/21 171 lb 3.2 oz (77.7 kg)  02/11/21 173 lb 1 oz (78.5 kg)  01/30/21 173 lb (78.5 kg)     Kidney Function Lab Results  Component Value Date/Time   CREATININE 0.69 02/12/2021 03:18 AM   CREATININE 0.68 01/30/2021 10:18 AM   CREATININE 0.80 06/19/2020 10:33 AM   GFR 81.20 12/25/2020 10:15 AM   GFRNONAA >60 02/12/2021 03:18 AM   GFRAA >60 12/15/2017 12:56 PM  BMP Latest Ref Rng & Units 02/12/2021 01/30/2021 12/25/2020  Glucose 70 - 99 mg/dL 143(H) 120(H) 103(H)  BUN 8 - 23 mg/dL 13 11 13   Creatinine 0.61 - 1.24 mg/dL 0.69 0.68 0.81  BUN/Creat Ratio 6 - 22 (calc) - - -  Sodium 135 - 145 mmol/L 139 140 141  Potassium 3.5 - 5.1 mmol/L 3.9 4.4 3.9  Chloride 98 - 111 mmol/L 107 102 103  CO2 22 - 32 mmol/L 25 29 32  Calcium 8.9 - 10.3 mg/dL 8.6(L) 9.7 9.8   I spoke with Joseph Simpson this morning and he has been doing well. He had hip replacement 8 weeks ago and has recovered well. He went to PT for 7 weeks and stated he healed up quickly and was even back to driving a couple weeks after surgery. He has been out of  PT for a week now and has since then been keeping indoors. He has problems with extreme temperatures over 80 degrees and stated with these temperatures his BP is bound to decrease. He noted that his diastolic reading is usually pretty low, but reports he feels well and isn't concerned. Since getting bit by an insect, the site has improved significantly with no redness or swelling. Overall he has no complaints or concerns for his health.   Current antihypertensive regimen:  Losartan 100 mg tablet - 50 mg twice daily  How often are you checking your Blood Pressure?  Patient stated he tries to check his BP daily   Current home BP readings: 120/60,150-160/60  What recent interventions/DTPs have been made by any provider to improve Blood Pressure control since last CPP Visit:  No recent interventions   Any recent hospitalizations or ED visits since last visit with CPP? No  What diet changes have been made to improve Blood Pressure Control?  Patient stated he has had no changes to his diet. He makes his own breakfast, eats snacks for lunch, and eats at a restaurant for dinner.   What exercise is being done to improve your Blood Pressure Control?  Patient stated he does not get much activity  Adherence Review: Is the patient currently on ACE/ARB medication? Yes Does the patient have >5 day gap between last estimated fill dates? No  Star Rating Drugs: Atorvastatin 20 mg- 90 DS last filled 03/26/21 Losartan 100 mg- 90 DS last filled 03/26/21  Wilford Sports CPA, CMA

## 2021-05-06 ENCOUNTER — Other Ambulatory Visit: Payer: Self-pay | Admitting: Cardiology

## 2021-05-08 ENCOUNTER — Ambulatory Visit (INDEPENDENT_AMBULATORY_CARE_PROVIDER_SITE_OTHER): Payer: Medicare Other

## 2021-05-08 ENCOUNTER — Other Ambulatory Visit: Payer: Self-pay

## 2021-05-08 DIAGNOSIS — E538 Deficiency of other specified B group vitamins: Secondary | ICD-10-CM

## 2021-05-08 MED ORDER — CYANOCOBALAMIN 1000 MCG/ML IJ SOLN
1000.0000 ug | Freq: Once | INTRAMUSCULAR | Status: AC
  Administered 2021-05-08: 1000 ug via INTRAMUSCULAR

## 2021-05-08 NOTE — Progress Notes (Signed)
Per orders of Dr. Yong Channel, injection of B-12 given by Tobe Sos in left deltoid. Patient tolerated injection well. Patient will make appointment for 1 month.

## 2021-05-20 NOTE — Progress Notes (Deleted)
Cardiology Office Note   Date:  05/20/2021   ID:  Joseph Simpson, DOB December 14, 1934, MRN [id number]  PCP:  Joseph Olp, MD  Cardiologist:   Joseph Neace Martinique, MD   No chief complaint on file.     History of Present Illness: Joseph Simpson is a 86 y.o. male who presents for follow up CAD and PVCs. He has a  past medical history of hypertension, PVCs, obstructive sleep apnea not on CPAP, presyncope, and nonobstructive CAD.  He had a abnormal stress test in 2013, follow-up cardiac catheterization demonstrated nonobstructive CAD.  He was seen in Dec 2021. Doing well from a cardiac standpoint. In March 2022 underwent THR.   He states he is doing very well from a cardiac standpoint. Denies any significant palpitations this past year. Notes if he does more strenuous activity such as vacuuming or mowing the grass his palpitations will act up so he just doesn't do these things anymore. No chest pain or dyspnea. No dizziness.     Past Medical History:  Diagnosis Date   Abnormal stress test December 2013   s/p cardiac cath with minimal nonobstructive CAD and normal LV function; medical management recommended   Arthritis    CAD (coronary artery disease)    Dyslipidemia    Dysrhythmia    GERD (gastroesophageal reflux disease)    Heart murmur    History of hiatal hernia    History of kidney stones    Hypertension    Macular degeneration    Melanoma ([place])    skin   Obstructive sleep apnea 09/23/2007   Wore cpap previously. Stopped and not interested despite risks.      OSA (obstructive sleep apnea)    PAC (premature atrial contraction) 10/25/2015   PVC's (premature ventricular contractions) 09/27/2013   SVT (supraventricular tachycardia) ([place])    Syncope     Past Surgical History:  Procedure Laterality Date   APPENDECTOMY     CARDIAC CATHETERIZATION  December 2013   minimal nonobstructive CAD with normal LV function   CATARACT EXTRACTION     CYSTOSCOPY WITH LITHOLAPAXY N/A  07/13/2016   Procedure: CYSTOSCOPY WITH LITHOLAPAXY;  Surgeon: Franchot Gallo, MD;  Location: [facility];  Service: Urology;  Laterality: N/A;   CYSTOSCOPY/RETROGRADE/URETEROSCOPY/STONE EXTRACTION WITH BASKET  02/24/2012   Procedure: CYSTOSCOPY/RETROGRADE/URETEROSCOPY/STONE EXTRACTION WITH BASKET;  Surgeon: Franchot Gallo, MD;  Location: [facility];  Service: Urology;  Laterality: Bilateral;  1 HOUR   GASTRECTOMY     HEMORRHOID SURGERY     HERNIA REPAIR  07/13/2016   INGUINAL HERNIA REPAIR Left 07/13/2016   Procedure: REPAIR LEFT INGUINAL HERNIA;  Surgeon: Armandina Gemma, MD;  Location: [facility];  Service: General;  Laterality: Left;   INSERTION OF MESH Left 07/13/2016   Procedure: INSERTION OF MESH;  Surgeon: Armandina Gemma, MD;  Location: [facility];  Service: General;  Laterality: Left;   MELANOMA EXCISION  05-05-2007   LEFT FOREARM   NOSE SURGERY     TONSILLECTOMY     TOTAL HIP ARTHROPLASTY Left 02/11/2021   Procedure: TOTAL HIP ARTHROPLASTY;  Surgeon: Marchia Bond, MD;  Location: [facility];  Service: Orthopedics;  Laterality: Left;   TOTAL SHOULDER ARTHROPLASTY Right 12/29/2017   TOTAL SHOULDER ARTHROPLASTY Right 12/29/2017   Procedure: TOTAL SHOULDER ARTHROPLASTY;  Surgeon: Hiram Gash, MD;  Location: [facility];  Service: Orthopedics;  Laterality: Right;     Current Outpatient Medications  Medication Sig Dispense Refill   acetaminophen (TYLENOL) 500 MG tablet Take  500 mg by mouth 2 (two) times daily as needed for moderate pain or headache.     atorvastatin (LIPITOR) 20 MG tablet Take 20 mg by mouth every Monday, Wednesday, and Friday. 90 tablet 3   bevacizumab (AVASTIN) 2.5 mg/0.1 mL SOLN 2.5 mg by Intravitreal route. Injection to eyes every 5 weeks - use with vigamox     Carboxymeth-Glycerin-Polysorb (REFRESH OPTIVE ADVANCED OP) Place 1 drop into both eyes daily.     Carboxymethylcellulose Sodium 1 % GEL Place 1 drop into both eyes daily.     Cyanocobalamin (B-12 COMPLIANCE  INJECTION IJ) Inject 1 Dose as directed every 30 (thirty) days.     enoxaparin (LOVENOX) 40 MG/0.4ML injection      losartan (COZAAR) 100 MG tablet TAKE HALF TABLET BY MOUTH TWICE DAILY 90 tablet 0   metoCLOPramide (REGLAN) 5 MG tablet Take 5 mg by mouth 2 (two) times daily.     metoprolol tartrate (LOPRESSOR) 25 MG tablet Take 1 tablet (25 mg total) by mouth 2 (two) times daily. 180 tablet 3   mupirocin ointment (BACTROBAN) 2 % Apply 1 application topically 3 (three) times daily. 30 g 0   ondansetron (ZOFRAN) 4 MG tablet Take 1 tablet (4 mg total) by mouth every 8 (eight) hours as needed for nausea or vomiting. 10 tablet 0   pantoprazole (PROTONIX) 40 MG tablet Take 40 mg by mouth 2 (two) times daily.     polyethylene glycol (MIRALAX / GLYCOLAX) 17 g packet Take 17 g by mouth daily as needed for moderate constipation.     rivaroxaban (XARELTO) 10 MG TABS tablet Take 1 tablet (10 mg total) by mouth daily. 30 tablet 0   sennosides-docusate sodium (SENOKOT-S) 8.6-50 MG tablet Take 2 tablets by mouth daily. 30 tablet 1   tamsulosin (FLOMAX) 0.4 MG CAPS capsule Take 0.4 mg by mouth daily as needed (urinary flow). 30 capsule    traMADol (ULTRAM) 50 MG tablet Take 1 tablet (50 mg total) by mouth every 6 (six) hours as needed. 20 tablet 0   VIGAMOX 0.5 % ophthalmic solution Place 1 drop into both eyes as directed. Take every 5 weeks as directed - use with avastin, instill 1 drop into both eyes 4 times daily for 2 days after eye injections     No current facility-administered medications for this visit.    Allergies:   Aspirin, Percocet [oxycodone-acetaminophen], and Vitamins [apatate]    Social History:  The patient  reports that he quit smoking about 64 years ago. His smoking use included cigarettes. He has a 0.40 pack-year smoking history. He has never used smokeless tobacco. He reports that he does not drink alcohol and does not use drugs.   Family History:  The patient's family history includes  Cancer in his sister; Heart disease in his mother; Lung cancer in his brother; Pneumonia in his father.    ROS:  Please see the history of present illness.   Otherwise, review of systems are positive for none.   All other systems are reviewed and negative.    PHYSICAL EXAM: VS:  There were no vitals taken for this visit. , BMI There is no height or weight on file to calculate BMI. GEN: Well nourished, well developed, in no acute distress  HEENT: normal  Neck: no JVD, carotid bruits, or masses Cardiac: RRR; no murmurs, rubs, or gallops,no edema  Respiratory:  clear to auscultation bilaterally, normal work of breathing GI: soft, nontender, nondistended, + BS MS: no deformity or atrophy  Skin: warm and dry, no rash Neuro:  Strength and sensation are intact Psych: euthymic mood, full affect   EKG:  EKG is ordered today. The ekg ordered today demonstrates NSR rate 55, nonspecific IVCD. No PVCs. I have personally reviewed and interpreted this study.    Recent Labs: 12/25/2020: ALT 12 02/12/2021: BUN 13; Creatinine, Ser 0.69; Hemoglobin 12.6; Platelets 147; Potassium 3.9; Sodium 139    Lipid Panel    Component Value Date/Time   CHOL 125 12/25/2020 1015   TRIG 114.0 12/25/2020 1015   HDL 37.10 (L) 12/25/2020 1015   CHOLHDL 3 12/25/2020 1015   VLDL 22.8 12/25/2020 1015   LDLCALC 65 12/25/2020 1015   LDLDIRECT 83 06/19/2020 1033      Wt Readings from Last 3 Encounters:  03/17/21 171 lb 3.2 oz (77.7 kg)  02/11/21 173 lb 1 oz (78.5 kg)  01/30/21 173 lb (78.5 kg)      Other studies Reviewed: Additional studies/ records that were reviewed today include: none. Review of the above records demonstrates: N/A   ASSESSMENT AND PLAN:  1.  PVCs: He has not had any significant palpitation recently.  Continue on current therapy with metoprolol   Hypertension: Blood pressure well controlled on current therapy.   Hyperlipidemia: LDL is acceptable on current lipitor does.      4.    Nonobstructive CAD: Seen on previous cardiac catheterization in 2013.  Denies any recent chest discomfort.   Current medicines are reviewed at length with the patient today.  The patient does not have concerns regarding medicines.  The following changes have been made:  no change  Labs/ tests ordered today include:  No orders of the defined types were placed in this encounter.    Disposition:   FU with me in 1 year  Signed, Soloman Mckeithan Martinique, MD  05/20/2021 2:09 PM    [facility] [address], [place], [place], [address] Phone [phone], Fax [phone]

## 2021-05-23 ENCOUNTER — Ambulatory Visit: Payer: Medicare Other | Admitting: Cardiology

## 2021-05-27 ENCOUNTER — Encounter (INDEPENDENT_AMBULATORY_CARE_PROVIDER_SITE_OTHER): Payer: Medicare Other | Admitting: Ophthalmology

## 2021-05-27 ENCOUNTER — Other Ambulatory Visit: Payer: Self-pay

## 2021-05-27 DIAGNOSIS — H43813 Vitreous degeneration, bilateral: Secondary | ICD-10-CM | POA: Diagnosis not present

## 2021-05-27 DIAGNOSIS — H33301 Unspecified retinal break, right eye: Secondary | ICD-10-CM

## 2021-05-27 DIAGNOSIS — I10 Essential (primary) hypertension: Secondary | ICD-10-CM

## 2021-05-27 DIAGNOSIS — H35033 Hypertensive retinopathy, bilateral: Secondary | ICD-10-CM | POA: Diagnosis not present

## 2021-05-27 DIAGNOSIS — H353231 Exudative age-related macular degeneration, bilateral, with active choroidal neovascularization: Secondary | ICD-10-CM

## 2021-05-27 DIAGNOSIS — D3132 Benign neoplasm of left choroid: Secondary | ICD-10-CM

## 2021-05-30 DIAGNOSIS — K3184 Gastroparesis: Secondary | ICD-10-CM | POA: Diagnosis not present

## 2021-05-30 DIAGNOSIS — K219 Gastro-esophageal reflux disease without esophagitis: Secondary | ICD-10-CM | POA: Diagnosis not present

## 2021-06-10 ENCOUNTER — Other Ambulatory Visit: Payer: Self-pay

## 2021-06-10 ENCOUNTER — Ambulatory Visit (INDEPENDENT_AMBULATORY_CARE_PROVIDER_SITE_OTHER): Payer: Medicare Other

## 2021-06-10 DIAGNOSIS — E538 Deficiency of other specified B group vitamins: Secondary | ICD-10-CM | POA: Diagnosis not present

## 2021-06-10 MED ORDER — CYANOCOBALAMIN 1000 MCG/ML IJ SOLN
1000.0000 ug | Freq: Once | INTRAMUSCULAR | Status: AC
  Administered 2021-06-10: 1000 ug via INTRAMUSCULAR

## 2021-06-10 NOTE — Progress Notes (Signed)
Joseph Simpson 86 yr old male presents to office today for monthly B12 injection per Garret Reddish, MD. Administered CYANOCOBALAMN 1,000 mcg/mL IM left arm. Patient tolerated well. Aware to return next month for his next injection.

## 2021-06-11 ENCOUNTER — Telehealth: Payer: Self-pay

## 2021-06-11 NOTE — Progress Notes (Signed)
Chronic Care Management Pharmacy Assistant   Name: Joseph Simpson  MRN: [id number] DOB: 05/03/1935   Reason for Encounter: Disease State - General Adherence   Recent office visits:  03/17/21 Joseph Flaming, MD  - Insect bite - Mupirocin 2% cream prescribed. Follow up as needed.   Recent consult visits:  05/30/21 Joseph Stabler, MD - Gastroenterology - GERD - No medication changes. Follow up in 1 year.    Hospital visits:  Medication Reconciliation was completed by comparing discharge summary, patient's EMR and Pharmacy list, and upon discussion with patient.  Admitted to the hospital on 02/11/21 due to Hip replacement. Discharge date was 02/12/21. Discharged from [facility]?Medications Started at [facility] Discharge:?? -given Xarelto for DVT prophylaxis.  Medication Changes at Hospital Discharge: No medication changes were noted.  Medications Discontinued at Hospital Discharge: No medications were discontinued.  Medications that remain the same after Hospital Discharge:??  -All other medications will remain the same.    Medications: Outpatient Encounter Medications as of 06/11/2021  Medication Sig Note   acetaminophen (TYLENOL) 500 MG tablet Take 500 mg by mouth 2 (two) times daily as needed for moderate pain or headache.    atorvastatin (LIPITOR) 20 MG tablet Take 20 mg by mouth every Monday, Wednesday, and Friday.    bevacizumab (AVASTIN) 2.5 mg/0.1 mL SOLN 2.5 mg by Intravitreal route. Injection to eyes every 5 weeks - use with vigamox    Carboxymeth-Glycerin-Polysorb (REFRESH OPTIVE ADVANCED OP) Place 1 drop into both eyes daily.    Carboxymethylcellulose Sodium 1 % GEL Place 1 drop into both eyes daily.    Cyanocobalamin (B-12 COMPLIANCE INJECTION IJ) Inject 1 Dose as directed every 30 (thirty) days.    enoxaparin (LOVENOX) 40 MG/0.4ML injection  03/17/2021: Pre surgery-Hip   losartan (COZAAR) 100 MG tablet TAKE HALF TABLET BY MOUTH TWICE DAILY     metoCLOPramide (REGLAN) 5 MG tablet Take 5 mg by mouth 2 (two) times daily.    metoprolol tartrate (LOPRESSOR) 25 MG tablet Take 1 tablet (25 mg total) by mouth 2 (two) times daily.    mupirocin ointment (BACTROBAN) 2 % Apply 1 application topically 3 (three) times daily.    ondansetron (ZOFRAN) 4 MG tablet Take 1 tablet (4 mg total) by mouth every 8 (eight) hours as needed for nausea or vomiting.    pantoprazole (PROTONIX) 40 MG tablet Take 40 mg by mouth 2 (two) times daily.    polyethylene glycol (MIRALAX / GLYCOLAX) 17 g packet Take 17 g by mouth daily as needed for moderate constipation.    rivaroxaban (XARELTO) 10 MG TABS tablet Take 1 tablet (10 mg total) by mouth daily.    sennosides-docusate sodium (SENOKOT-S) 8.6-50 MG tablet Take 2 tablets by mouth daily.    tamsulosin (FLOMAX) 0.4 MG CAPS capsule Take 0.4 mg by mouth daily as needed (urinary flow).    traMADol (ULTRAM) 50 MG tablet Take 1 tablet (50 mg total) by mouth every 6 (six) hours as needed.    VIGAMOX 0.5 % ophthalmic solution Place 1 drop into both eyes as directed. Take every 5 weeks as directed - use with avastin, instill 1 drop into both eyes 4 times daily for 2 days after eye injections    No facility-administered encounter medications on file as of 06/11/2021.    Have you had any problems recently with your health? Patient denies current health concerns at this time.   Have you had any problems with your pharmacy? Patient denies  having any current issues with his pharmacy.  What issues or side effects are you having with your medications? Patient denies having any current issues or side effects with his current medications.   What would you like me to pass along to Joseph Simpson, CPP for them to help you with?  Patient reports he is doing well since having his hip replacement and is no longer requiring a walker or assistance walking.   What can we do to take care of you better? Patient did not have any  recommendations at this time. He is satisfied with his current level of care.   Star Rating Drugs: atorvastatin (LIPITOR) 20 MG tablet - last filled 03/26/21 90 days  losartan (COZAAR) 100 MG tablet - last filled 03/26/21 90 days   Joseph Simpson, CCMA Clinical Pharmacist Assistant  [phone]  Time Spent: 20 minutes

## 2021-06-12 ENCOUNTER — Ambulatory Visit: Payer: Medicare Other

## 2021-06-23 ENCOUNTER — Other Ambulatory Visit: Payer: Self-pay | Admitting: Family Medicine

## 2021-06-23 NOTE — Progress Notes (Signed)
Phone [phone] In person visit   Subjective:   Joseph Simpson is a 86 y.o. year old very pleasant male patient who presents for/with See problem oriented charting Chief Complaint  Patient presents with   Gastroesophageal Reflux   Hypertension   Hyperlipidemia   Hyperglycemia    This visit occurred during the SARS-CoV-2 public health emergency.  Safety protocols were in place, including screening questions prior to the visit, additional usage of staff PPE, and extensive cleaning of exam room while observing appropriate contact time as indicated for disinfecting solutions.   Past Medical History-  Patient Active Problem List   Diagnosis Date Noted   CAD (coronary artery disease)     Priority: High   Macular degeneration 11/17/2011    Priority: High   Hyperglycemia 12/21/2019    Priority: Medium   B12 deficiency 03/09/2019    Priority: Medium   Hyperlipidemia 06/10/2016    Priority: Medium   BPH (benign prostatic hyperplasia) 11/23/2014    Priority: Medium   Palpitations 07/31/2014    Priority: Medium   Dyspnea 02/27/2014    Priority: Medium   History of melanoma- arm 09/26/2007    Priority: Medium   Essential hypertension 09/23/2007    Priority: Medium   GERD 09/23/2007    Priority: Medium   Osteoarthritis of left hip 01/30/2019    Priority: Low   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 12/29/2017    Priority: Low   History of hiatal hernia     Priority: Low   PAC (premature atrial contraction) 10/25/2015    Priority: Low   Squamous cell carcinoma in situ 09/09/2015    Priority: Low   Cough 05/10/2014    Priority: Low   Pulmonary nodule 05/10/2014    Priority: Low   PVC's (premature ventricular contractions) 09/27/2013    Priority: Low   Abnormal stress test 10/16/2012    Priority: Low   Murmur 02/10/2012    Priority: Low   Nephrolithiasis 09/04/2011    Priority: Low   Obstructive sleep apnea 09/23/2007    Priority: Low   S/P  hip replacement, left 02/11/2021   Syncope    Gastroparesis 02/15/2017   Hx of adenomatous colonic polyps 02/15/2017   Left inguinal hernia 07/12/2016    Medications- reviewed and updated Current Outpatient Medications  Medication Sig Dispense Refill   acetaminophen (TYLENOL) 500 MG tablet Take 500 mg by mouth 2 (two) times daily as needed for moderate pain or headache.     amLODipine (NORVASC) 2.5 MG tablet Take 0.5 tablets (1.25 mg total) by mouth daily. 45 tablet 3   atorvastatin (LIPITOR) 20 MG tablet TAKE ONE TABLET BY MOUTH THREE TIMES A WEEK 39 tablet 0   bevacizumab (AVASTIN) 2.5 mg/0.1 mL SOLN 2.5 mg by Intravitreal route. Injection to eyes every 5 weeks - use with vigamox     Carboxymeth-Glycerin-Polysorb (REFRESH OPTIVE ADVANCED OP) Place 1 drop into both eyes daily.     Carboxymethylcellulose Sodium 1 % GEL Place 1 drop into both eyes daily.     Cyanocobalamin (B-12 COMPLIANCE INJECTION IJ) Inject 1 Dose as directed every 30 (thirty) days.     metoCLOPramide (REGLAN) 5 MG tablet Take 5 mg by mouth 2 (two) times daily.     pantoprazole (PROTONIX) 40 MG tablet Take 40 mg by mouth 2 (two) times daily.     polyethylene glycol (MIRALAX / GLYCOLAX) 17 g packet Take 17 g by mouth daily as needed for moderate constipation.  sennosides-docusate sodium (SENOKOT-S) 8.6-50 MG tablet Take 2 tablets by mouth daily. (Patient taking differently: Take 2 tablets by mouth as needed.) 30 tablet 1   traMADol (ULTRAM) 50 MG tablet Take 1 tablet (50 mg total) by mouth every 6 (six) hours as needed. 20 tablet 0   VIGAMOX 0.5 % ophthalmic solution Place 1 drop into both eyes as directed. Take every 5 weeks as directed - use with avastin, instill 1 drop into both eyes 4 times daily for 2 days after eye injections     losartan (COZAAR) 100 MG tablet Take one-half tablet by mouth twice daily 90 tablet 3   metoprolol tartrate (LOPRESSOR) 25 MG tablet Take 0.5 tablets (12.5 mg total) by mouth 2 (two) times  daily. 90 tablet 3   No current facility-administered medications for this visit.     Objective:  BP (!) 144/64   Pulse (!) 40   Temp 98.5 F (36.9 C) (Temporal)   Ht '5\' 11"'$  (1.803 m)   Wt 178 lb 9.6 oz (81 kg)   SpO2 97%   BMI 24.91 kg/m  Gen: NAD, resting comfortably CV: bradycardic- I do not detect murmur previously noted by cardiology on 10/28/20 Lungs: CTAB no crackles, wheeze, rhonchi Ext: no edema Skin: warm, dry MSK: pain along mid portion of plantar fascia, collapsed transverse arch    Assessment and Plan   #hip replacement back in march- recovering well- starting to get back out on his slighshot. 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 Also history of gastroparesis with Reglan 5 mg 2 daily (did not do well on once a day). His reflux was well controlled with pantoprazole 40 mg twice daily. Patient had an EGD in 2009 that was normal.  Lab Results  Component Value Date   VITAMINB12 >1506 (H) 12/25/2020  - doing once a month B12 injections with long term PPI A/P: overall symptoms are stable- continue current meds for GERD   #Hypertension  S: Controlled on losartan 50 mg twice daily (cuts 100 in half), metoprolol 25 mg twice daily (also helped palpitations)  History of orthostatic hypotension-previously also on amlodipine 2.5 mg. No recent issues Home readings #s: at home running in 150s or so since he saw [place] last BP Readings from Last 3 Encounters:  06/27/21 (!) 144/64  03/17/21 128/60  02/12/21 108/64  A/P: poor control but also very bradycardic- will do the following -reduce metoprolol to 12.5 mg twice daily -continue losartan 50 mg BID - start amlodipine 1.25 mg at bedtime and follow up 1 month- may need higher dose -also check tsh to make sure not causing bradycardia  #Hyperlipidemia/CAD S: Patient has had  minimally obstructive CAD on cath 2013. Cannot use aspirin with prior one third removal of stomach- followed with JosephJordan .  - atorvastatin 20 mg tablet-full tablet three times a week  LDL goal was at least under 100 but preferred under 70. -felt off/dizzy on full atorvastatin '40mg'$  weekly in past  No chest pain or shortness of breath. Occasional palpitations- even on metoprolol Lab Results  Component Value Date   CHOL 125 12/25/2020   HDL 37.10 (L) 12/25/2020   LDLCALC 65 12/25/2020   LDLDIRECT 83 06/19/2020   TRIG 114.0 12/25/2020   CHOLHDL 3 12/25/2020   A/P: CAD asymptomatic and not able to take aspirin- continue statin alone. Lipids at  goal last check with LDL under 70!   #BPH- Dr. Diona Fanti urology on 10/30/2020- Patient remained on tamsulosin 0.4 mg daily as needed through urology in past- not recently taking  # Hyperglycemia/insulin resistance/prediabetes- peak a1c 6.1 S: Medication:none Exercise and diet- walking some as able, weighted arm exercises. Trying to eat reasonably healthy Lab Results  Component Value Date   HGBA1C 5.7 12/25/2020   HGBA1C 6.0 (H) 06/19/2020   HGBA1C 6.1 12/21/2019   A/P: hopefully stable- update a1c today  # right foot pain-patient states with tennis shoes he has noted some pain along the MTP joints on right foot that goes across in a band.  Also notes when getting his pedicure had substantial pain along midpoint of plantar fascia.  We will get a sports medicine consult for their expert opinion and consider ultrasound  Recommended follow up: Return in about 1 month (around 07/28/2021) for follow-up or sooner as needed. Future Appointments  Date Time Provider [place]  07/15/2021 10:00 AM [facility] NURSE [facility]  07/22/2021  7:30 AM Hayden Pedro, MD [facility] None  07/28/2021 11:15 AM Almyra Deforest, PA [facility]  09/02/2021 10:30 AM [facility] CCM PHARMACIST [facility]  09/04/2021  8:45 AM [facility] HEALTH COACH [facility]    Lab/Order associations:   ICD-10-CM   1. Hyperlipidemia, unspecified hyperlipidemia type  E78.5 CBC with Differential/Platelet    Comprehensive metabolic panel    TSH    2. Coronary artery disease involving native coronary artery of native heart without angina pectoris  I25.10     3. Essential hypertension  I10     4. Gastroesophageal reflux disease without esophagitis  K21.9     5. Hyperglycemia  R73.9 Hemoglobin A1c    6. Benign prostatic hyperplasia with nocturia  N40.1    R35.1     7. Right foot pain  M79.671 Ambulatory referral to Sports Medicine      Meds ordered this encounter  Medications   metoprolol tartrate (LOPRESSOR) 25 MG tablet    Sig: Take 0.5 tablets (12.5 mg total) by mouth 2 (two) times daily.    Dispense:  90 tablet    Refill:  3   losartan (COZAAR) 100 MG tablet    Sig: Take one-half tablet by mouth twice daily    Dispense:  90 tablet    Refill:  3   amLODipine (NORVASC) 2.5 MG tablet    Sig: Take 0.5 tablets (1.25 mg total) by mouth daily.    Dispense:  45 tablet    Refill:  3    I,Jada Bradford,acting as a scribe for Garret Reddish, MD.,have documented all relevant documentation on the behalf of Garret Reddish, MD,as directed by  Garret Reddish, MD while in the presence of Garret Reddish, MD.   I, Garret Reddish, MD, have reviewed all documentation for this visit. The documentation on 06/27/21 for the exam, diagnosis, procedures, and orders are all accurate and complete.   Return precautions advised.  Garret Reddish, MD

## 2021-06-24 ENCOUNTER — Encounter (INDEPENDENT_AMBULATORY_CARE_PROVIDER_SITE_OTHER): Payer: Medicare Other | Admitting: Ophthalmology

## 2021-06-24 ENCOUNTER — Other Ambulatory Visit: Payer: Self-pay

## 2021-06-24 DIAGNOSIS — I10 Essential (primary) hypertension: Secondary | ICD-10-CM

## 2021-06-24 DIAGNOSIS — H353231 Exudative age-related macular degeneration, bilateral, with active choroidal neovascularization: Secondary | ICD-10-CM

## 2021-06-24 DIAGNOSIS — H33301 Unspecified retinal break, right eye: Secondary | ICD-10-CM | POA: Diagnosis not present

## 2021-06-24 DIAGNOSIS — D3132 Benign neoplasm of left choroid: Secondary | ICD-10-CM

## 2021-06-24 DIAGNOSIS — H43813 Vitreous degeneration, bilateral: Secondary | ICD-10-CM | POA: Diagnosis not present

## 2021-06-24 DIAGNOSIS — H35033 Hypertensive retinopathy, bilateral: Secondary | ICD-10-CM

## 2021-06-25 DIAGNOSIS — M1612 Unilateral primary osteoarthritis, left hip: Secondary | ICD-10-CM | POA: Diagnosis not present

## 2021-06-27 ENCOUNTER — Ambulatory Visit (INDEPENDENT_AMBULATORY_CARE_PROVIDER_SITE_OTHER): Payer: Medicare Other | Admitting: Family Medicine

## 2021-06-27 ENCOUNTER — Encounter: Payer: Self-pay | Admitting: Family Medicine

## 2021-06-27 ENCOUNTER — Other Ambulatory Visit: Payer: Self-pay

## 2021-06-27 VITALS — BP 144/64 | HR 40 | Temp 98.5°F | Ht 71.0 in | Wt 178.6 lb

## 2021-06-27 DIAGNOSIS — E785 Hyperlipidemia, unspecified: Secondary | ICD-10-CM | POA: Diagnosis not present

## 2021-06-27 DIAGNOSIS — K219 Gastro-esophageal reflux disease without esophagitis: Secondary | ICD-10-CM

## 2021-06-27 DIAGNOSIS — N401 Enlarged prostate with lower urinary tract symptoms: Secondary | ICD-10-CM

## 2021-06-27 DIAGNOSIS — R739 Hyperglycemia, unspecified: Secondary | ICD-10-CM

## 2021-06-27 DIAGNOSIS — R351 Nocturia: Secondary | ICD-10-CM | POA: Diagnosis not present

## 2021-06-27 DIAGNOSIS — I2510 Atherosclerotic heart disease of native coronary artery without angina pectoris: Secondary | ICD-10-CM | POA: Diagnosis not present

## 2021-06-27 DIAGNOSIS — I10 Essential (primary) hypertension: Secondary | ICD-10-CM | POA: Diagnosis not present

## 2021-06-27 DIAGNOSIS — M79671 Pain in right foot: Secondary | ICD-10-CM

## 2021-06-27 LAB — CBC WITH DIFFERENTIAL/PLATELET
Basophils Absolute: 0 10*3/uL (ref 0.0–0.1)
Basophils Relative: 0.4 % (ref 0.0–3.0)
Eosinophils Absolute: 0.1 10*3/uL (ref 0.0–0.7)
Eosinophils Relative: 2.8 % (ref 0.0–5.0)
HCT: 46.3 % (ref 39.0–52.0)
Hemoglobin: 15.8 g/dL (ref 13.0–17.0)
Lymphocytes Relative: 20.6 % (ref 12.0–46.0)
Lymphs Abs: 1.1 10*3/uL (ref 0.7–4.0)
MCHC: 34.1 g/dL (ref 30.0–36.0)
MCV: 90.5 fl (ref 78.0–100.0)
Monocytes Absolute: 0.5 10*3/uL (ref 0.1–1.0)
Monocytes Relative: 10 % (ref 3.0–12.0)
Neutro Abs: 3.5 10*3/uL (ref 1.4–7.7)
Neutrophils Relative %: 66.2 % (ref 43.0–77.0)
Platelets: 131 10*3/uL — ABNORMAL LOW (ref 150.0–400.0)
RBC: 5.11 Mil/uL (ref 4.22–5.81)
RDW: 13.6 % (ref 11.5–15.5)
WBC: 5.2 10*3/uL (ref 4.0–10.5)

## 2021-06-27 LAB — HEMOGLOBIN A1C: Hgb A1c MFr Bld: 6 % (ref 4.6–6.5)

## 2021-06-27 LAB — COMPREHENSIVE METABOLIC PANEL
ALT: 13 U/L (ref 0–53)
AST: 17 U/L (ref 0–37)
Albumin: 4.5 g/dL (ref 3.5–5.2)
Alkaline Phosphatase: 60 U/L (ref 39–117)
BUN: 10 mg/dL (ref 6–23)
CO2: 31 mEq/L (ref 19–32)
Calcium: 9.6 mg/dL (ref 8.4–10.5)
Chloride: 102 mEq/L (ref 96–112)
Creatinine, Ser: 0.77 mg/dL (ref 0.40–1.50)
GFR: 82.16 mL/min (ref >60.00)
Glucose, Bld: 102 mg/dL — ABNORMAL HIGH (ref 70–99)
Potassium: 4 mEq/L (ref 3.5–5.1)
Sodium: 142 mEq/L (ref 135–145)
Total Bilirubin: 1.7 mg/dL — ABNORMAL HIGH (ref 0.2–1.2)
Total Protein: 7.2 g/dL (ref 6.0–8.3)

## 2021-06-27 LAB — TSH: TSH: 4.27 u[IU]/mL (ref 0.35–5.50)

## 2021-06-27 MED ORDER — LOSARTAN POTASSIUM 100 MG PO TABS: ORAL_TABLET | ORAL | 3 refills | Status: DC

## 2021-06-27 MED ORDER — AMLODIPINE BESYLATE 2.5 MG PO TABS: 1.2500 mg | ORAL_TABLET | Freq: Every day | ORAL | 3 refills | Status: DC

## 2021-06-27 MED ORDER — METOPROLOL TARTRATE 25 MG PO TABS: 12.5000 mg | ORAL_TABLET | Freq: Two times a day (BID) | ORAL | 3 refills | Status: DC

## 2021-06-27 NOTE — Patient Instructions (Addendum)
Health Maintenance Due  Topic Date Due   COVID-19 Vaccine (4 - Booster)   - Please consider getting your COVID-19 vaccination in the Fall.  01/04/2021   INFLUENZA VACCINE Not available in office YET.   -Please consider getting your flu shot in the Fall. If you get this outside of our office, please let us know.  06/16/2021   Please take a half of pill in the morning (12.5 mg) and other half (12.'5mg'$ ) in the evening of Metoprolol 25 mg. We are reducing this due to your low heart rate to see if this will help -schedule 1 month recheck  Start half tablet of Amlodipine 2.5 mg before bedtime (so you will take 1.25 mg total)  Please stop by lab before you go If you have mychart- we will send your results within 3 business days of [place] receiving them.  If you do not have mychart- we will call you about results within 5 business days of [place] receiving them.  *please also note that you will see labs on mychart as soon as they post. I will later go in and write notes on them- will say "notes from Dr. Yong Channel"  We will call you within two weeks about your referral to sports medicine. If you do not hear within 2 weeks, give [place] a call.   Recommended follow up: Return in about 1 month (around 07/28/2021) for follow-up or sooner as needed.

## 2021-07-03 ENCOUNTER — Telehealth: Payer: Self-pay | Admitting: Cardiology

## 2021-07-03 ENCOUNTER — Telehealth: Payer: Self-pay

## 2021-07-03 NOTE — Telephone Encounter (Signed)
Called and spoke with pt and pt states Dr. Yong Channel started him on amlodipine and ever since then he has been dizzy. I advised pt that Dr. Yong Channel is out of office until Monday and he then stated that he has also called  Dr. Martinique for advice but has not heard back yet. Pt said he will hold off on taking the Amlodipine until he hears back from either [place] or Dr. Martinique.

## 2021-07-03 NOTE — Telephone Encounter (Signed)
Do not go back on higher metoprolol dose. On minimal dose of amlodipine. BP looks fine. The question is did his HR improve?  Joseph Simpson Martinique MD, [facility]

## 2021-07-03 NOTE — Telephone Encounter (Signed)
Patient is needing to discuss the side effects of medication ( dizzy) would like to discuss.

## 2021-07-03 NOTE — Telephone Encounter (Signed)
Spoke to patient . Patient states he saw Dr Yong Channel on 06/27/21 for 6 month check up.  Blood preesure was elevated 144/64 pulse in the 40's.  Dr Yong Channel decrease metoprolol tartrate to 12.5 mg  twice a day  ( take morning and evening dose)  Added  Amlodipine 1.25 mg ( 1/2 tablet of 2.5 mg)  ( takes at bedtime)   Patient states his blood pressure range has been 131-136/67-72  since the medication changes.   Patient states he has been dizzy since the changes mainly in the morning.  RN  informed patient will defer to Dr Martinique . Patient aware Dr Martinique not in office today. RN informed patient patient that Dr Martinique may refer him back to Dr Yong Channel since  medication changes were started by Dr Yong Channel. Patient verbalized understanding.

## 2021-07-03 NOTE — Telephone Encounter (Signed)
Pt c/o medication issue:  1. Name of Medication:  metoprolol tartrate (LOPRESSOR) 25 MG tablet amLODipine (NORVASC) 2.5 MG tablet  2. How are you currently taking this medication (dosage and times per day)? Metoprolol half a tablet twice a day, amlodipine half a tablet daily  3. Are you having a reaction (difficulty breathing--STAT)? no  4. What is your medication issue? Patient states he saw his PCP Dr. Yong Channel and his BP was a little high so his metoprolol dose was changed and he was started on amlodpine. He states his BP has been good since this change, but now he is dizzy. He states he is not sure if he needs to stop taking the medications or go back to the original. He states Dr. Yong Channel is not in the office today. Please advise.

## 2021-07-03 NOTE — Progress Notes (Signed)
[place] Phone: [phone] Subjective:   Joseph Simpson, am serving as a scribe for Dr. Hulan Saas.  This visit occurred during the SARS-CoV-2 public health emergency.  Safety protocols were in place, including screening questions prior to the visit, additional usage of staff PPE, and extensive cleaning of exam room while observing appropriate contact time as indicated for disinfecting solutions.   I'm seeing this patient by the request  of:  Marin Olp, MD  CC: Right foot pain  [id number]  Joseph Simpson is a 86 y.o. male coming in with complaint of B plantar foot pain, R>L. Patient states .  He locates his pain to his R mid-plantar fascia and R MTP joints. Has had pain for years in both feet burning in nature. Pain will cause him to have to pull over if he is driving long distances.    Patient did undergo a hip replacement in March 2022.    Past Medical History:  Diagnosis Date   Abnormal stress test December 2013   s/p cardiac cath with minimal nonobstructive CAD and normal LV function; medical management recommended   Arthritis    CAD (coronary artery disease)    Dyslipidemia    Dysrhythmia    GERD (gastroesophageal reflux disease)    Heart murmur    History of hiatal hernia    History of kidney stones    Hypertension    Macular degeneration    Melanoma ([place])    skin   Obstructive sleep apnea 09/23/2007   Wore cpap previously. Stopped and not interested despite risks.      OSA (obstructive sleep apnea)    PAC (premature atrial contraction) 10/25/2015   PVC's (premature ventricular contractions) 09/27/2013   SVT (supraventricular tachycardia) ([place])    Syncope    Past Surgical History:  Procedure Laterality Date   APPENDECTOMY     CARDIAC CATHETERIZATION  December 2013   minimal nonobstructive CAD with normal LV function   CATARACT EXTRACTION     CYSTOSCOPY WITH LITHOLAPAXY N/A  07/13/2016   Procedure: CYSTOSCOPY WITH LITHOLAPAXY;  Surgeon: Franchot Gallo, MD;  Location: [facility];  Service: Urology;  Laterality: N/A;   CYSTOSCOPY/RETROGRADE/URETEROSCOPY/STONE EXTRACTION WITH BASKET  02/24/2012   Procedure: CYSTOSCOPY/RETROGRADE/URETEROSCOPY/STONE EXTRACTION WITH BASKET;  Surgeon: Franchot Gallo, MD;  Location[facility];  Service: Urology;  Laterality: Bilateral;  1 HOUR   GASTRECTOMY     HEMORRHOID SURGERY     HERNIA REPAIR  07/13/2016   INGUINAL HERNIA REPAIR Left 07/13/2016   Procedure: REPAIR LEFT INGUINAL HERNIA;  Surgeon: Armandina Gemma, MD;  Location: [facility];  Service: General;  Laterality: Left;   INSERTION OF MESH Left 07/13/2016   Procedure: INSERTION OF MESH;  Surgeon: Armandina Gemma, MD;  Location: [facility];  Service: General;  Laterality: Left;   MELANOMA EXCISION  05-05-2007   LEFT FOREARM   NOSE SURGERY     TONSILLECTOMY     TOTAL HIP ARTHROPLASTY Left 02/11/2021   Procedure: TOTAL HIP ARTHROPLASTY;  Surgeon: Marchia Bond, MD;  Location: [facility];  Service: Orthopedics;  Laterality: Left;   TOTAL SHOULDER ARTHROPLASTY Right 12/29/2017   TOTAL SHOULDER ARTHROPLASTY Right 12/29/2017   Procedure: TOTAL SHOULDER ARTHROPLASTY;  Surgeon: Hiram Gash, MD;  Location: [place];  Service: Orthopedics;  Laterality: Right;   Social History   Socioeconomic History   Marital status: Widowed    Spouse name: Not on file   Number  of children: 1   Years of education: Not on file   Highest education level: Not on file  Occupational History   Occupation: retired  Tobacco Use   Smoking status: Former    Packs/day: 0.10    Years: 4.00    Pack years: 0.40    Types: Cigarettes    Quit date: 09/03/1956    Years since quitting: 64.8   Smokeless tobacco: Never  Vaping Use   Vaping Use: Never used  Substance and Sexual Activity   Alcohol use: Simpson    Alcohol/week: 0.0 standard drinks   Drug use: Simpson   Sexual activity: Yes  Other Topics Concern   Not on  file  Social History Narrative   Widowed (lost wife 2015 from lung cancer never smoker), 1 son, 2 grandkids, 1 greatgrandkid (born on Christmas)      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       Hobbies: ride motorcycle (slingshot 3 year), rabbit and dear hunt   Social Determinants of Radio broadcast assistant Strain: Low Risk    Difficulty of Paying Living Expenses: Not hard at all  Food Insecurity: Simpson Food Insecurity   Worried About Charity fundraiser in the Last Year: Never true   Arboriculturist in the Last Year: Never true  Transportation Needs: Simpson Transportation Needs   Lack of Transportation (Medical): Simpson   Lack of Transportation (Non-Medical): Simpson  Physical Activity: Inactive   Days of Exercise per Week: 0 days   Minutes of Exercise per Session: 0 min  Stress: Simpson Stress Concern Present   Feeling of Stress : Not at all  Social Connections: Moderately Integrated   Frequency of Communication with Friends and Family: More than three times a week   Frequency of Social Gatherings with Friends and Family: More than three times a week   Attends Religious Services: More than 4 times per year   Active Member of [employer] or Organizations: Yes   Attends Archivist Meetings: 1 to 4 times per year   Marital Status: Widowed   Allergies  Allergen Reactions   Aspirin Other (See Comments)    UPSET STOMACH   Percocet [Oxycodone-Acetaminophen] Nausea And Vomiting   Vitamins [Apatate] Other (See Comments)    Bothers stomach   Family History  Problem Relation Age of Onset   Heart disease Mother        CABG in her 21s   Pneumonia Father    Cancer Sister        breast   Lung cancer Brother        smoker     Current Outpatient Medications (Cardiovascular):    amLODipine (NORVASC) 2.5 MG tablet, Take 0.5 tablets (1.25 mg total) by mouth daily.   atorvastatin (LIPITOR) 20 MG tablet, TAKE ONE TABLET BY MOUTH THREE TIMES A WEEK   losartan (COZAAR) 100 MG  tablet, Take one-half tablet by mouth twice daily   metoprolol tartrate (LOPRESSOR) 25 MG tablet, Take 0.5 tablets (12.5 mg total) by mouth 2 (two) times daily.   Current Outpatient Medications (Analgesics):    acetaminophen (TYLENOL) 500 MG tablet, Take 500 mg by mouth 2 (two) times daily as needed for moderate pain or headache.   traMADol (ULTRAM) 50 MG tablet, Take 1 tablet (50 mg total) by mouth every 6 (six) hours as needed.  Current Outpatient Medications (Hematological):    Cyanocobalamin (B-12 COMPLIANCE INJECTION IJ), Inject 1 Dose as directed every  30 (thirty) days.  Current Outpatient Medications (Other):    bevacizumab (AVASTIN) 2.5 mg/0.1 mL SOLN, 2.5 mg by Intravitreal route. Injection to eyes every 5 weeks - use with vigamox   Carboxymeth-Glycerin-Polysorb (REFRESH OPTIVE ADVANCED OP), Place 1 drop into both eyes daily.   Carboxymethylcellulose Sodium 1 % GEL, Place 1 drop into both eyes daily.   metoCLOPramide (REGLAN) 5 MG tablet, Take 5 mg by mouth 2 (two) times daily.   pantoprazole (PROTONIX) 40 MG tablet, Take 40 mg by mouth 2 (two) times daily.   polyethylene glycol (MIRALAX / GLYCOLAX) 17 g packet, Take 17 g by mouth daily as needed for moderate constipation.   sennosides-docusate sodium (SENOKOT-S) 8.6-50 MG tablet, Take 2 tablets by mouth daily. (Patient taking differently: Take 2 tablets by mouth as needed.)   VIGAMOX 0.5 % ophthalmic solution, Place 1 drop into both eyes as directed. Take every 5 weeks as directed - use with avastin, instill 1 drop into both eyes 4 times daily for 2 days after eye injections   Reviewed prior external information including notes and imaging from  primary care provider As well as notes that were available from care everywhere and other healthcare systems.  Past medical history, social, surgical and family history all reviewed in electronic medical record.  Simpson pertanent information unless stated regarding to the chief complaint.    Review of Systems:  Simpson headache, visual changes, nausea, vomiting, diarrhea, constipation, dizziness, abdominal pain, skin rash, fevers, chills, night sweats, weight loss, swollen lymph nodes, body aches, joint swelling, chest pain, shortness of breath, mood changes.  Objective  Blood pressure 140/72, pulse (!) 55, height '5\' 11"'$  (1.803 m), weight 180 lb (81.6 kg), SpO2 97 %.   General: Simpson apparent distress alert and oriented x3 mood and affect normal, dressed appropriately.  HEENT: Pupils equal, extraocular movements intact  Respiratory: Patient's speak in full sentences and does not appear short of breath  Cardiovascular: Simpson lower extremity edema, non tender, Simpson erythema  Gait normal with good balance and coordination.  MSK: Patient's foot exam shows the patient does have a fairly rigid midfoot.  Patient does have some mild breakdown noted of the transverse arch bilaterally.  Neurovascularly intact distally.   Patient does have onychomycosis fairly severely.  Mild hallux limitus noted.  Mild overpronation of the hindfoot. Dorsalis pedis pulses seems to be full.  Limited muscular skeletal ultrasound was performed and interpreted by Hulan Saas, M  Limited ultrasound of patient's right foot shows the patient has some mild arthritic changes of the midfoot.  Patient does have hypoechoic changes with swelling noted of the first MTP.  Moderate arthritic changes noted.  Patient does have some very mild dilatation of the nerves of the foot but Simpson cortical irregularities noted. Impression: Nonspecific findings on ultrasound with underlying arthritic changes.    Impression and Recommendations:     The above documentation has been reviewed and is accurate and complete Lyndal Pulley, DO

## 2021-07-04 ENCOUNTER — Ambulatory Visit: Payer: Self-pay

## 2021-07-04 ENCOUNTER — Encounter: Payer: Self-pay | Admitting: Family Medicine

## 2021-07-04 ENCOUNTER — Other Ambulatory Visit: Payer: Self-pay

## 2021-07-04 ENCOUNTER — Ambulatory Visit (INDEPENDENT_AMBULATORY_CARE_PROVIDER_SITE_OTHER): Payer: Medicare Other | Admitting: Family Medicine

## 2021-07-04 VITALS — BP 140/72 | HR 55 | Ht 71.0 in | Wt 180.0 lb

## 2021-07-04 DIAGNOSIS — M79672 Pain in left foot: Secondary | ICD-10-CM

## 2021-07-04 DIAGNOSIS — M79671 Pain in right foot: Secondary | ICD-10-CM | POA: Insufficient documentation

## 2021-07-04 DIAGNOSIS — I2510 Atherosclerotic heart disease of native coronary artery without angina pectoris: Secondary | ICD-10-CM

## 2021-07-04 DIAGNOSIS — E538 Deficiency of other specified B group vitamins: Secondary | ICD-10-CM | POA: Diagnosis not present

## 2021-07-04 NOTE — Telephone Encounter (Signed)
130's/70-75 the past week at home blood pressure readings.  Even with numbers at this range, he is still experiencing dizziness. Last dose of amlodipine 8/17, has not been experiencing dizziness since stopping medication.

## 2021-07-04 NOTE — Telephone Encounter (Signed)
Joseph Simpson- thanks so much. So he was on all of the medicines listed below at 130s/70-75 correct?  Lets have him monitor BP over next week and then update [place]. He can remain off the amlodipine.

## 2021-07-04 NOTE — Telephone Encounter (Signed)
Team please tell me what he is taking BP med wise. This is what I have from last visit- confirm these doses for me "-reduce metoprolol to 12.5 mg twice daily -continue losartan 50 mg BID - start amlodipine 1.25 mg at bedtime and follow up 1 month- may need higher dose"  What has his blood pressure been running at home? If not checking can he get a cuff and give [place] some home readings early next week?

## 2021-07-04 NOTE — Telephone Encounter (Signed)
Yes taking metoprolol 12.5 BID, losartan 50 mg BID, and amlodipine 1.25 mg nightly with BP 130's/70-75. I will let patient know to hold amlodipine and continue to monitor BP at home

## 2021-07-04 NOTE — Assessment & Plan Note (Signed)
Patient has had B12 deficiency previously that could be contributing.  Does have dry skin as well.  We will continue to monitor.  Encouraged her to continue taking B12 in case this is contributing.

## 2021-07-04 NOTE — Assessment & Plan Note (Signed)
Patient does have more bilateral foot pain.  Patient does have dry skin Contributing.  Discussed the potential of low sugars.  In addition to this does have a history of B12 deficiency that could also be contributing.  Patient on exam does have breakdown of the transverse arch and there is a possibility of a neuroma but patient does not have any pain on exam today.  In addition to this patient does does have signs and symptoms that could be consistent with a peripheral neuropathy.  If no significant improvement I do feel nerve conduction test as well as potentially laboratory work-up as well as ABIs would be beneficial.  Patient will follow up again after doing conservative therapy in 6 weeks

## 2021-07-04 NOTE — Patient Instructions (Signed)
Eucerin or Aveeno Don't lace last eye Spenco Total Support Orthotics See me again in 6 weeks Exercises 3x a week IF not better will consider labs

## 2021-07-04 NOTE — Telephone Encounter (Signed)
Spoke to patient . Patient is aware. Per patient heart has been in the 60's since the change in medication

## 2021-07-11 ENCOUNTER — Telehealth: Payer: Self-pay

## 2021-07-11 NOTE — Telephone Encounter (Signed)
Pt would like to speak to a nurse about medication. Please Advise.

## 2021-07-14 NOTE — Telephone Encounter (Signed)
1 persistent him to always be less than 135/85 at home but it at least appears his average is pretty close to that-we will officially calculate next week at visit when I see him back

## 2021-07-14 NOTE — Telephone Encounter (Signed)
Called and spoke with pt and he states he is calling to give an update on BP readings since cutting the Metoprolol in half:  147/75 68 148/70 64  138/70 64 116/54 59 128/80 86 126/73 56 109/63 52 129/66 61 133/66 63 156/73 79 130/70 55  Pt sees you next week and wanted you to have this for that visit.

## 2021-07-15 ENCOUNTER — Other Ambulatory Visit: Payer: Self-pay

## 2021-07-15 ENCOUNTER — Ambulatory Visit (INDEPENDENT_AMBULATORY_CARE_PROVIDER_SITE_OTHER): Payer: Medicare Other

## 2021-07-15 DIAGNOSIS — E538 Deficiency of other specified B group vitamins: Secondary | ICD-10-CM | POA: Diagnosis not present

## 2021-07-15 MED ORDER — CYANOCOBALAMIN 1000 MCG/ML IJ SOLN
1000.0000 ug | Freq: Once | INTRAMUSCULAR | Status: AC
  Administered 2021-07-15: 1000 ug via INTRAMUSCULAR

## 2021-07-15 NOTE — Progress Notes (Signed)
Per orders of Dr. Yong Channel, injection of B-12 given by Tobe Sos in right deltoid. Patient tolerated injection well. Patient will make appointment for 1 month.

## 2021-07-22 ENCOUNTER — Encounter (INDEPENDENT_AMBULATORY_CARE_PROVIDER_SITE_OTHER): Payer: Medicare Other | Admitting: Ophthalmology

## 2021-07-22 ENCOUNTER — Other Ambulatory Visit: Payer: Self-pay

## 2021-07-22 DIAGNOSIS — H35033 Hypertensive retinopathy, bilateral: Secondary | ICD-10-CM

## 2021-07-22 DIAGNOSIS — I10 Essential (primary) hypertension: Secondary | ICD-10-CM

## 2021-07-22 DIAGNOSIS — H43813 Vitreous degeneration, bilateral: Secondary | ICD-10-CM | POA: Diagnosis not present

## 2021-07-22 DIAGNOSIS — H33301 Unspecified retinal break, right eye: Secondary | ICD-10-CM

## 2021-07-22 DIAGNOSIS — D3132 Benign neoplasm of left choroid: Secondary | ICD-10-CM

## 2021-07-22 DIAGNOSIS — H353231 Exudative age-related macular degeneration, bilateral, with active choroidal neovascularization: Secondary | ICD-10-CM | POA: Diagnosis not present

## 2021-07-25 DIAGNOSIS — L570 Actinic keratosis: Secondary | ICD-10-CM | POA: Diagnosis not present

## 2021-07-25 DIAGNOSIS — L821 Other seborrheic keratosis: Secondary | ICD-10-CM | POA: Diagnosis not present

## 2021-07-25 DIAGNOSIS — L578 Other skin changes due to chronic exposure to nonionizing radiation: Secondary | ICD-10-CM | POA: Diagnosis not present

## 2021-07-25 DIAGNOSIS — D487 Neoplasm of uncertain behavior of other specified sites: Secondary | ICD-10-CM | POA: Diagnosis not present

## 2021-07-25 DIAGNOSIS — D485 Neoplasm of uncertain behavior of skin: Secondary | ICD-10-CM | POA: Diagnosis not present

## 2021-07-28 ENCOUNTER — Other Ambulatory Visit: Payer: Self-pay

## 2021-07-28 ENCOUNTER — Ambulatory Visit (INDEPENDENT_AMBULATORY_CARE_PROVIDER_SITE_OTHER): Payer: Medicare Other | Admitting: Physician Assistant

## 2021-07-28 ENCOUNTER — Encounter: Payer: Self-pay | Admitting: Physician Assistant

## 2021-07-28 VITALS — BP 128/74 | HR 59 | Ht 73.0 in | Wt 171.0 lb

## 2021-07-28 DIAGNOSIS — I2510 Atherosclerotic heart disease of native coronary artery without angina pectoris: Secondary | ICD-10-CM

## 2021-07-28 DIAGNOSIS — I10 Essential (primary) hypertension: Secondary | ICD-10-CM

## 2021-07-28 DIAGNOSIS — E785 Hyperlipidemia, unspecified: Secondary | ICD-10-CM | POA: Diagnosis not present

## 2021-07-28 NOTE — Progress Notes (Signed)
Cardiology Office Note:    Date:  07/30/2021   ID:  Joseph Simpson, DOB Mar 16, 1935, MRN [id number]  PCP:  Marin Olp, MD   [facility] Providers Cardiologist:  Peter Martinique, MD     Referring MD: Marin Olp, MD   Chief Complaint  Patient presents with   Follow-up    Seen for Dr. Martinique    History of Present Illness:    Joseph Simpson is a 86 y.o. male with a hx of hypertension, PVCs, obstructive sleep apnea not on CPAP, presyncope, and nonobstructive CAD.  He had a abnormal stress test in 2013, follow-up cardiac catheterization demonstrated nonobstructive CAD.  He was last seen by Kerin Ransom in December 2021 for preoperative clearance prior to left hip replacement.  He was cleared without further work-up.  He was seen by his PCP Dr. Yong Channel on 06/27/2021, heart rate at the time was 40 bpm.  His metoprolol was cut back and amlodipine was added to his medical regimen.  Patient presents today for follow-up.  He says after he started on amlodipine, he did have dizziness, Dr. Yong Channel told him to stop the amlodipine.  Even after stopping the amlodipine, his blood pressure and heart rate are very well controlled.  His blood pressure today is 120/74.  Heart rate in the 50s. He no longer feel any dizziness or fatigue.  I recommended continue on the current therapy.  He can follow-up in 6 months.  Past Medical History:  Diagnosis Date   Abnormal stress test December 2013   s/p cardiac cath with minimal nonobstructive CAD and normal LV function; medical management recommended   Arthritis    CAD (coronary artery disease)    Dyslipidemia    Dysrhythmia    GERD (gastroesophageal reflux disease)    Heart murmur    History of hiatal hernia    History of kidney stones    Hypertension    Macular degeneration    Melanoma ([place])    skin   Obstructive sleep apnea 09/23/2007   Wore cpap previously. Stopped and not interested despite risks.      OSA (obstructive sleep apnea)    PAC  (premature atrial contraction) 10/25/2015   PVC's (premature ventricular contractions) 09/27/2013   SVT (supraventricular tachycardia) ([place])    Syncope     Past Surgical History:  Procedure Laterality Date   APPENDECTOMY     CARDIAC CATHETERIZATION  December 2013   minimal nonobstructive CAD with normal LV function   CATARACT EXTRACTION     CYSTOSCOPY WITH LITHOLAPAXY N/A 07/13/2016   Procedure: CYSTOSCOPY WITH LITHOLAPAXY;  Surgeon: Franchot Gallo, MD;  Location: [facility];  Service: Urology;  Laterality: N/A;   CYSTOSCOPY/RETROGRADE/URETEROSCOPY/STONE EXTRACTION WITH BASKET  02/24/2012   Procedure: CYSTOSCOPY/RETROGRADE/URETEROSCOPY/STONE EXTRACTION WITH BASKET;  Surgeon: Franchot Gallo, MD;  Location: [facility];  Service: Urology;  Laterality: Bilateral;  1 HOUR   GASTRECTOMY     HEMORRHOID SURGERY     HERNIA REPAIR  07/13/2016   INGUINAL HERNIA REPAIR Left 07/13/2016   Procedure: REPAIR LEFT INGUINAL HERNIA;  Surgeon: Armandina Gemma, MD;  Location: [facility];  Service: General;  Laterality: Left;   INSERTION OF MESH Left 07/13/2016   Procedure: INSERTION OF MESH;  Surgeon: Armandina Gemma, MD;  Location: [facility];  Service: General;  Laterality: Left;   MELANOMA EXCISION  05-05-2007   LEFT FOREARM   NOSE SURGERY     TONSILLECTOMY     TOTAL HIP ARTHROPLASTY Left 02/11/2021  Procedure: TOTAL HIP ARTHROPLASTY;  Surgeon: Marchia Bond, MD;  Location: [facility];  Service: Orthopedics;  Laterality: Left;   TOTAL SHOULDER ARTHROPLASTY Right 12/29/2017   TOTAL SHOULDER ARTHROPLASTY Right 12/29/2017   Procedure: TOTAL SHOULDER ARTHROPLASTY;  Surgeon: Hiram Gash, MD;  Location: [facility];  Service: Orthopedics;  Laterality: Right;    Current Medications: Current Meds  Medication Sig   acetaminophen (TYLENOL) 500 MG tablet Take 500 mg by mouth 2 (two) times daily as needed for moderate pain or headache.   atorvastatin (LIPITOR) 20 MG tablet TAKE ONE TABLET BY MOUTH THREE TIMES A WEEK    bevacizumab (AVASTIN) 2.5 mg/0.1 mL SOLN 2.5 mg by Intravitreal route. Injection to eyes every 5 weeks - use with vigamox   Carboxymeth-Glycerin-Polysorb (REFRESH OPTIVE ADVANCED OP) Place 1 drop into both eyes daily.   Carboxymethylcellulose Sodium 1 % GEL Place 1 drop into both eyes daily.   Cyanocobalamin (B-12 COMPLIANCE INJECTION IJ) Inject 1 Dose as directed every 30 (thirty) days.   losartan (COZAAR) 100 MG tablet Take one-half tablet by mouth twice daily   metoCLOPramide (REGLAN) 5 MG tablet Take 5 mg by mouth 2 (two) times daily.   metoprolol tartrate (LOPRESSOR) 25 MG tablet Take 0.5 tablets (12.5 mg total) by mouth 2 (two) times daily.   pantoprazole (PROTONIX) 40 MG tablet Take 40 mg by mouth 2 (two) times daily.   polyethylene glycol (MIRALAX / GLYCOLAX) 17 g packet Take 17 g by mouth daily as needed for moderate constipation.   sennosides-docusate sodium (SENOKOT-S) 8.6-50 MG tablet Take 2 tablets by mouth daily. (Patient taking differently: Take 2 tablets by mouth as needed.)   VIGAMOX 0.5 % ophthalmic solution Place 1 drop into both eyes as directed. Take every 5 weeks as directed - use with avastin, instill 1 drop into both eyes 4 times daily for 2 days after eye injections     Allergies:   Aspirin, Percocet [oxycodone-acetaminophen], and Vitamins [apatate]   Social History   Socioeconomic History   Marital status: Widowed    Spouse name: Not on file   Number of children: 1   Years of education: Not on file   Highest education level: Not on file  Occupational History   Occupation: retired  Tobacco Use   Smoking status: Former    Packs/day: 0.10    Years: 4.00    Pack years: 0.40    Types: Cigarettes    Quit date: 09/03/1956    Years since quitting: 64.9   Smokeless tobacco: Never  Vaping Use   Vaping Use: Never used  Substance and Sexual Activity   Alcohol use: No    Alcohol/week: 0.0 standard drinks   Drug use: No   Sexual activity: Yes  Other Topics Concern    Not on file  Social History Narrative   Widowed (lost wife 2015 from lung cancer never smoker), 1 son, 2 grandkids, 1 greatgrandkid (born on Christmas)      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       Hobbies: ride motorcycle (slingshot 3 year), rabbit and dear hunt   Social Determinants of Radio broadcast assistant Strain: Low Risk    Difficulty of Paying Living Expenses: Not hard at all  Food Insecurity: No Food Insecurity   Worried About Charity fundraiser in the Last Year: Never true   Arboriculturist in the Last Year: Never true  Transportation Needs: No Transportation Needs   Lack  of Transportation (Medical): No   Lack of Transportation (Non-Medical): No  Physical Activity: Inactive   Days of Exercise per Week: 0 days   Minutes of Exercise per Session: 0 min  Stress: No Stress Concern Present   Feeling of Stress : Not at all  Social Connections: Moderately Integrated   Frequency of Communication with Friends and Family: More than three times a week   Frequency of Social Gatherings with Friends and Family: More than three times a week   Attends Religious Services: More than 4 times per year   Active Member of [employer] or Organizations: Yes   Attends Archivist Meetings: 1 to 4 times per year   Marital Status: Widowed     Family History: The patient's family history includes Cancer in his sister; Heart disease in his mother; Lung cancer in his brother; Pneumonia in his father.  ROS:   Please see the history of present illness.     All other systems reviewed and are negative.  EKGs/Labs/Other Studies Reviewed:    The following studies were reviewed today:  Echo 04/16/2009  1. Left ventricle: The cavity size was normal. Wall thickness was      increased in a pattern of mild LVH. Systolic function was normal.      The estimated ejection fraction was in the range of 55% to 60%.      Wall motion was normal; there were no regional wall motion       abnormalities.   2. Aortic valve: Trivial regurgitation.  EKG:  EKG is ordered today.  The ekg ordered today demonstrates normal sinus rhythm, no significant ST-T wave changes  Recent Labs: 06/27/2021: ALT 13; BUN 10; Creatinine, Ser 0.77; Hemoglobin 15.8; Platelets 131.0; Potassium 4.0; Sodium 142; TSH 4.27  Recent Lipid Panel    Component Value Date/Time   CHOL 125 12/25/2020 1015   TRIG 114.0 12/25/2020 1015   HDL 37.10 (L) 12/25/2020 1015   CHOLHDL 3 12/25/2020 1015   VLDL 22.8 12/25/2020 1015   LDLCALC 65 12/25/2020 1015   LDLDIRECT 83 06/19/2020 1033     Risk Assessment/Calculations:           Physical Exam:    VS:  BP 128/74   Pulse (!) 59   Ht '6\' 1"'$  (1.854 m)   Wt 171 lb (77.6 kg)   SpO2 98%   BMI 22.56 kg/m     Wt Readings from Last 3 Encounters:  07/30/21 180 lb 12.8 oz (82 kg)  07/28/21 171 lb (77.6 kg)  07/04/21 180 lb (81.6 kg)     GEN:  Well nourished, well developed in no acute distress HEENT: Normal NECK: No JVD; No carotid bruits LYMPHATICS: No lymphadenopathy CARDIAC: RRR, no murmurs, rubs, gallops RESPIRATORY:  Clear to auscultation without rales, wheezing or rhonchi  ABDOMEN: Soft, non-tender, non-distended MUSCULOSKELETAL:  No edema; No deformity  SKIN: Warm and dry NEUROLOGIC:  Alert and oriented x 3 PSYCHIATRIC:  Normal affect   ASSESSMENT:    1. Coronary artery disease involving native coronary artery of native heart without angina pectoris   2. Primary hypertension   3. Hyperlipidemia LDL goal <70    PLAN:    In order of problems listed above:  CAD: History of nonobstructive CAD on previous cath.  Continue Lipitor.  Denies any recent chest pain  Hypertension: He came off of amlodipine due to dizziness, surprisingly, even after he came off of amlodipine, blood pressure is still very well controlled.  We will  continue on the current therapy  Hyperlipidemia: Continue Lipitor.         Medication Adjustments/Labs and  Tests Ordered: Current medicines are reviewed at length with the patient today.  Concerns regarding medicines are outlined above.  Orders Placed This Encounter  Procedures   EKG 12-Lead   No orders of the defined types were placed in this encounter.   Patient Instructions  Medication Instructions:  Your physician recommends that you continue on your current medications as directed. Please refer to the Current Medication list given to you today.  *If you need a refill on your cardiac medications before your next appointment, please call your pharmacy*  Lab Work: NONE ordered at this time of appointment   If you have labs (blood work) drawn today and your tests are completely normal, you will receive your results only by: [place] (if you have MyChart) OR A paper copy in the mail If you have any lab test that is abnormal or we need to change your treatment, we will call you to review the results.  Testing/Procedures: NONE ordered at this time of appointment   Follow-Up: At [facility], you and your health needs are our priority.  As part of our continuing mission to provide you with exceptional heart care, we have created designated Provider Care Teams.  These Care Teams include your primary Cardiologist (physician) and Advanced Practice Providers (APPs -  Physician Assistants and Nurse Practitioners) who all work together to provide you with the care you need, when you need it.   Your next appointment:   6 month(s)  The format for your next appointment:   In Person  Provider:   Peter Martinique, MD  Other Instructions    Signed, Almyra Deforest, [place]  07/30/2021 11:36 PM    [place]

## 2021-07-28 NOTE — Patient Instructions (Signed)

## 2021-07-28 NOTE — Progress Notes (Signed)
Phone [phone] In person visit   Subjective:   Joseph Simpson is a 87 y.o. year old very pleasant male patient who presents for/with See problem oriented charting Chief Complaint  Patient presents with   Hypertension   Hyperlipidemia    This visit occurred during the SARS-CoV-2 public health emergency.  Safety protocols were in place, including screening questions prior to the visit, additional usage of staff PPE, and extensive cleaning of exam room while observing appropriate contact time as indicated for disinfecting solutions.   Past Medical History-  Patient Active Problem List   Diagnosis Date Noted   CAD (coronary artery disease)     Priority: High   Macular degeneration 11/17/2011    Priority: High   Hyperglycemia 12/21/2019    Priority: Medium   B12 deficiency 03/09/2019    Priority: Medium   Hyperlipidemia 06/10/2016    Priority: Medium   BPH (benign prostatic hyperplasia) 11/23/2014    Priority: Medium   Palpitations 07/31/2014    Priority: Medium   Dyspnea 02/27/2014    Priority: Medium   History of melanoma- arm 09/26/2007    Priority: Medium   Essential hypertension 09/23/2007    Priority: Medium   GERD 09/23/2007    Priority: Medium   Osteoarthritis of left hip 01/30/2019    Priority: Low   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 12/29/2017    Priority: Low   History of hiatal hernia     Priority: Low   PAC (premature atrial contraction) 10/25/2015    Priority: Low   Squamous cell carcinoma in situ 09/09/2015    Priority: Low   Cough 05/10/2014    Priority: Low   Pulmonary nodule 05/10/2014    Priority: Low   PVC's (premature ventricular contractions) 09/27/2013    Priority: Low   Abnormal stress test 10/16/2012    Priority: Low   Murmur 02/10/2012    Priority: Low   Nephrolithiasis 09/04/2011    Priority: Low   Obstructive sleep apnea 09/23/2007    Priority: Low   Bilateral foot pain 07/04/2021   S/P hip  replacement, left 02/11/2021   Syncope    Gastroparesis 02/15/2017   Hx of adenomatous colonic polyps 02/15/2017   Left inguinal hernia 07/12/2016    Medications- reviewed and updated Current Outpatient Medications  Medication Sig Dispense Refill   acetaminophen (TYLENOL) 500 MG tablet Take 500 mg by mouth 2 (two) times daily as needed for moderate pain or headache.     atorvastatin (LIPITOR) 20 MG tablet TAKE ONE TABLET BY MOUTH THREE TIMES A WEEK 39 tablet 0   bevacizumab (AVASTIN) 2.5 mg/0.1 mL SOLN 2.5 mg by Intravitreal route. Injection to eyes every 5 weeks - use with vigamox     Carboxymeth-Glycerin-Polysorb (REFRESH OPTIVE ADVANCED OP) Place 1 drop into both eyes daily.     Carboxymethylcellulose Sodium 1 % GEL Place 1 drop into both eyes daily.     Cyanocobalamin (B-12 COMPLIANCE INJECTION IJ) Inject 1 Dose as directed every 30 (thirty) days.     losartan (COZAAR) 100 MG tablet Take one-half tablet by mouth twice daily 90 tablet 3   metoCLOPramide (REGLAN) 5 MG tablet Take 5 mg by mouth 2 (two) times daily.     metoprolol tartrate (LOPRESSOR) 25 MG tablet Take 0.5 tablets (12.5 mg total) by mouth 2 (two) times daily. 90 tablet 3   pantoprazole (PROTONIX) 40 MG tablet Take 40 mg by mouth 2 (two) times daily.     polyethylene glycol (  MIRALAX / GLYCOLAX) 17 g packet Take 17 g by mouth daily as needed for moderate constipation.     sennosides-docusate sodium (SENOKOT-S) 8.6-50 MG tablet Take 2 tablets by mouth daily. (Patient taking differently: Take 2 tablets by mouth as needed.) 30 tablet 1   VIGAMOX 0.5 % ophthalmic solution Place 1 drop into both eyes as directed. Take every 5 weeks as directed - use with avastin, instill 1 drop into both eyes 4 times daily for 2 days after eye injections     No current facility-administered medications for this visit.     Objective:  BP (!) 131/59 Comment: recent home reading  Pulse 60   Temp 98.1 F (36.7 C) (Temporal)   Ht '6\' 1"'$  (1.854 m)    Wt 180 lb 12.8 oz (82 kg)   SpO2 96%   BMI 23.85 kg/m  Gen: NAD, resting comfortably    Assessment and Plan   #hypertension S: medication: from last note "poor control but also very bradycardic- will do the following -reduce metoprolol to 12.5 mg twice daily -continue losartan 50 mg BID - start amlodipine 1.25 mg at bedtime and follow up 1 month- may need higher dose" -we did have to cut out amlodipine as BP was running too low.  Lab Results  Component Value Date   TSH 4.27 06/27/2021  For bradycardia- tsh looked good last visit  Home readings #s:  Avg of 24 home readings 133.8333 69.125   BP Readings from Last 3 Encounters:  07/30/21 (!) 131/59  07/28/21 128/74  07/04/21 140/72  A/P: Controlled on home readings (slightly high here but was controlled at cardiology- home reading above listed). Continue current medications. Metoprolol 12.5 mg BID and losartan 50 mg BID- recheck in 6 months. Stay off amlodipine due to orthostatics again  # right foot pain-working with Dr. Tamala Julian on this. He reports having bone spur- has upcoming visit  Recommended follow up: Return in about 6 months (around 01/27/2022) for follow up- or sooner if needed. Future Appointments  Date Time Provider [place]  08/14/2021 10:15 AM [facility] NURSE [facility]  08/19/2021  7:30 AM Hayden Pedro, MD [facility] None  08/20/2021 10:15 AM Lyndal Pulley, DO [facility] None  09/02/2021 10:30 AM [facility] CCM PHARMACIST [facility]  09/04/2021  8:45 AM [facility] HEALTH COACH [facility]  01/26/2022 10:00 AM Martinique, Peter M, MD [facility]  01/27/2022 10:40 AM Yong Channel, Brayton Mars, MD [facility]    Lab/Order associations:   ICD-10-CM   1. Hyperlipidemia, unspecified hyperlipidemia type  E78.5     2. Essential hypertension  I10     3. Gastroesophageal reflux disease without esophagitis  K21.9     4. Hyperglycemia  R73.9     5. Benign prostatic hyperplasia with nocturia  N40.1    R35.1      6. Need for immunization against influenza  Z23 Flu Vaccine QUAD High Dose(Fluad)     I,Jada Bradford,acting as a scribe for Garret Reddish, MD.,have documented all relevant documentation on the behalf of Garret Reddish, MD,as directed by  Garret Reddish, MD while in the presence of Garret Reddish, MD.  I, Garret Reddish, MD, have reviewed all documentation for this visit. The documentation on 07/30/21 for the exam, diagnosis, procedures, and orders are all accurate and complete.  Return precautions advised.  Garret Reddish, MD

## 2021-07-30 ENCOUNTER — Ambulatory Visit (INDEPENDENT_AMBULATORY_CARE_PROVIDER_SITE_OTHER): Payer: Medicare Other | Admitting: Family Medicine

## 2021-07-30 ENCOUNTER — Encounter: Payer: Self-pay | Admitting: Physician Assistant

## 2021-07-30 ENCOUNTER — Encounter: Payer: Self-pay | Admitting: Family Medicine

## 2021-07-30 ENCOUNTER — Other Ambulatory Visit: Payer: Self-pay

## 2021-07-30 VITALS — BP 131/59 | HR 60 | Temp 98.1°F | Ht 73.0 in | Wt 180.8 lb

## 2021-07-30 DIAGNOSIS — N401 Enlarged prostate with lower urinary tract symptoms: Secondary | ICD-10-CM

## 2021-07-30 DIAGNOSIS — E785 Hyperlipidemia, unspecified: Secondary | ICD-10-CM

## 2021-07-30 DIAGNOSIS — K219 Gastro-esophageal reflux disease without esophagitis: Secondary | ICD-10-CM | POA: Diagnosis not present

## 2021-07-30 DIAGNOSIS — I10 Essential (primary) hypertension: Secondary | ICD-10-CM

## 2021-07-30 DIAGNOSIS — I2510 Atherosclerotic heart disease of native coronary artery without angina pectoris: Secondary | ICD-10-CM | POA: Diagnosis not present

## 2021-07-30 DIAGNOSIS — R739 Hyperglycemia, unspecified: Secondary | ICD-10-CM | POA: Diagnosis not present

## 2021-07-30 DIAGNOSIS — Z23 Encounter for immunization: Secondary | ICD-10-CM | POA: Diagnosis not present

## 2021-07-30 DIAGNOSIS — R351 Nocturia: Secondary | ICD-10-CM

## 2021-07-30 NOTE — Patient Instructions (Addendum)
Thanks for doing flu shot today!   Glad blood pressure and heart rate look better  Recommended follow up: Return in about 6 months (around 01/27/2022) for follow up- or sooner if needed.

## 2021-07-30 NOTE — Assessment & Plan Note (Signed)
S: medication: from last note "poor control but also very bradycardic- will do the following -reduce metoprolol to 12.5 mg twice daily -continue losartan 50 mg BID - start amlodipine 1.25 mg at bedtime and follow up 1 month- may need higher dose" -we did have to cut out amlodipine as BP was running too low.  Lab Results  Component Value Date   TSH 4.27 06/27/2021  For bradycardia- tsh looked good last visit  Home readings #s:  Avg of 24 home readings 133.8333 69.125   BP Readings from Last 3 Encounters:  07/30/21 (!) 131/59  07/28/21 128/74  07/04/21 140/72  A/P: Controlled on home readings (slightly high here but was controlled at cardiology- home reading above listed). Continue current medications. Metoprolol 12.5 mg BID and losartan 50 mg BID- recheck in 6 months. Stay off amlodipine due to orthostatics again

## 2021-08-01 ENCOUNTER — Telehealth: Payer: Self-pay | Admitting: Pharmacist

## 2021-08-01 NOTE — Chronic Care Management (AMB) (Addendum)
Chronic Care Management Pharmacy Assistant   Name: Joseph Simpson  MRN: [id number] DOB: 1935-08-11   Reason for Encounter: Hypertension Adherence Call    Recent office visits:  07/30/2021 OV (PCP) Marin Olp, MD; reduce metoprolol to 12.5 mg twice daily, stop Amlodipine as BP was running too low.  06/27/2021 OV (PCP) Marin Olp, MD; start Amlodipine 1.25 mg at bedtime and follow up in 1 month.  Recent consult visits:  07/28/2021 OV (cardiology) Almyra Deforest, PA-C; follow up CAD, no medication changes indicated.  07/04/2021 OV (sports medicine) [place], DO; right foot pain, no medication changes indicated.  Hospital visits:  None in previous 6 months  Medications: Outpatient Encounter Medications as of 08/01/2021  Medication Sig   acetaminophen (TYLENOL) 500 MG tablet Take 500 mg by mouth 2 (two) times daily as needed for moderate pain or headache.   atorvastatin (LIPITOR) 20 MG tablet TAKE ONE TABLET BY MOUTH THREE TIMES A WEEK   bevacizumab (AVASTIN) 2.5 mg/0.1 mL SOLN 2.5 mg by Intravitreal route. Injection to eyes every 5 weeks - use with vigamox   Carboxymeth-Glycerin-Polysorb (REFRESH OPTIVE ADVANCED OP) Place 1 drop into both eyes daily.   Carboxymethylcellulose Sodium 1 % GEL Place 1 drop into both eyes daily.   Cyanocobalamin (B-12 COMPLIANCE INJECTION IJ) Inject 1 Dose as directed every 30 (thirty) days.   losartan (COZAAR) 100 MG tablet Take one-half tablet by mouth twice daily   metoCLOPramide (REGLAN) 5 MG tablet Take 5 mg by mouth 2 (two) times daily.   metoprolol tartrate (LOPRESSOR) 25 MG tablet Take 0.5 tablets (12.5 mg total) by mouth 2 (two) times daily.   pantoprazole (PROTONIX) 40 MG tablet Take 40 mg by mouth 2 (two) times daily.   polyethylene glycol (MIRALAX / GLYCOLAX) 17 g packet Take 17 g by mouth daily as needed for moderate constipation.   sennosides-docusate sodium (SENOKOT-S) 8.6-50 MG tablet Take 2 tablets by mouth daily.  (Patient taking differently: Take 2 tablets by mouth as needed.)   VIGAMOX 0.5 % ophthalmic solution Place 1 drop into both eyes as directed. Take every 5 weeks as directed - use with avastin, instill 1 drop into both eyes 4 times daily for 2 days after eye injections   No facility-administered encounter medications on file as of 08/01/2021.   Reviewed chart prior to disease state call. Spoke with patient regarding BP  Recent Office Vitals: BP Readings from Last 3 Encounters:  07/30/21 (!) 131/59  07/28/21 128/74  07/04/21 140/72   Pulse Readings from Last 3 Encounters:  07/30/21 60  07/28/21 (!) 59  07/04/21 (!) 55    Wt Readings from Last 3 Encounters:  07/30/21 180 lb 12.8 oz (82 kg)  07/28/21 171 lb (77.6 kg)  07/04/21 180 lb (81.6 kg)     Kidney Function Lab Results  Component Value Date/Time   CREATININE 0.77 06/27/2021 10:35 AM   CREATININE 0.69 02/12/2021 03:18 AM   CREATININE 0.80 06/19/2020 10:33 AM   GFR 82.16 06/27/2021 10:35 AM   GFRNONAA >60 02/12/2021 03:18 AM   GFRAA >60 12/15/2017 12:56 PM    BMP Latest Ref Rng & Units 06/27/2021 02/12/2021 01/30/2021  Glucose 70 - 99 mg/dL 102(H) 143(H) 120(H)  BUN 6 - 23 mg/dL '10 13 11  '$ Creatinine 0.40 - 1.50 mg/dL 0.77 0.69 0.68  BUN/Creat Ratio 6 - 22 (calc) - - -  Sodium 135 - 145 mEq/L 142 139 140  Potassium 3.5 - 5.1 mEq/L 4.0 3.9  4.4  Chloride 96 - 112 mEq/L 102 107 102  CO2 19 - 32 mEq/L '31 25 29  '$ Calcium 8.4 - 10.5 mg/dL 9.6 8.6(L) 9.7    Current antihypertensive regimen:  Losartan 100 1/2 tab twice daily Metoprolol 12.5 mg twice daily  How often are you checking your Blood Pressure? daily  Current home BP readings: 130-40/60-70  What recent interventions/DTPs have been made by any provider to improve Blood Pressure control since last CPP Visit:   Any recent hospitalizations or ED visits since last visit with CPP? No  What diet changes have been made to improve Blood Pressure Control?  Patient states  he is eating a healthy diet.  What exercise is being done to improve your Blood Pressure Control?  Patient states he exercises regularly.  Adherence Review: Is the patient currently on ACE/ARB medication? Yes Does the patient have >5 day gap between last estimated fill dates? No   Care Gaps: Medicare Annual Wellness: scheduled 09/02/2021 at 10:30 am. Zoster Vaccines- Shingrix: Completed Hemoglobin A1C: 6.0% on 07/06/2021 Colonoscopy: Done 12/31/2013 Tetanus/DTAP: Next due on 07/06/2029 HPV Vaccines: Aged Out  Future Appointments  Date Time Provider [place]  08/14/2021 10:15 AM [facility] NURSE [facility]  08/19/2021  7:30 AM Hayden Pedro, MD [facility] None  08/20/2021 10:15 AM Lyndal Pulley, DO [facility] None  09/02/2021 10:30 AM [facility] CCM PHARMACIST [facility]  09/04/2021  8:45 AM [facility] HEALTH COACH [facility]  01/26/2022 10:00 AM Martinique, Peter M, MD [facility]  01/27/2022 10:40 AM Marin Olp, MD [facility] P[facility]     Star Rating Drugs: Atorvastatin 20 mg last filled 03/26/2021 90 DDS Losartan 100 mg last filled 06/25/2021 90 DS  April D Calhoun, [place] Pharmacist Assistant [phone]

## 2021-08-14 ENCOUNTER — Other Ambulatory Visit: Payer: Self-pay

## 2021-08-14 ENCOUNTER — Ambulatory Visit (INDEPENDENT_AMBULATORY_CARE_PROVIDER_SITE_OTHER): Payer: Medicare Other

## 2021-08-14 DIAGNOSIS — E538 Deficiency of other specified B group vitamins: Secondary | ICD-10-CM

## 2021-08-14 MED ORDER — CYANOCOBALAMIN 1000 MCG/ML IJ SOLN
1000.0000 ug | Freq: Once | INTRAMUSCULAR | Status: AC
  Administered 2021-08-14: 1000 ug via INTRAMUSCULAR

## 2021-08-14 NOTE — Progress Notes (Signed)
Per orders of Dr. Yong Channel, injection of B-12 given by Tobe Sos in right deltoid. Patient tolerated injection well. Patient will make appointment for 1 month.

## 2021-08-19 ENCOUNTER — Other Ambulatory Visit: Payer: Self-pay

## 2021-08-19 ENCOUNTER — Encounter (INDEPENDENT_AMBULATORY_CARE_PROVIDER_SITE_OTHER): Payer: Medicare Other | Admitting: Ophthalmology

## 2021-08-19 DIAGNOSIS — H353231 Exudative age-related macular degeneration, bilateral, with active choroidal neovascularization: Secondary | ICD-10-CM | POA: Diagnosis not present

## 2021-08-19 DIAGNOSIS — H33301 Unspecified retinal break, right eye: Secondary | ICD-10-CM

## 2021-08-19 DIAGNOSIS — H43813 Vitreous degeneration, bilateral: Secondary | ICD-10-CM

## 2021-08-19 DIAGNOSIS — I10 Essential (primary) hypertension: Secondary | ICD-10-CM

## 2021-08-19 DIAGNOSIS — D3132 Benign neoplasm of left choroid: Secondary | ICD-10-CM | POA: Diagnosis not present

## 2021-08-19 DIAGNOSIS — H35033 Hypertensive retinopathy, bilateral: Secondary | ICD-10-CM

## 2021-08-19 NOTE — Progress Notes (Signed)
[place] Phone: [phone] Subjective:   [name], am serving as a scribe for Dr. Hulan Saas. This visit occurred during the SARS-CoV-2 public health emergency.  Safety protocols were in place, including screening questions prior to the visit, additional usage of staff PPE, and extensive cleaning of exam room while observing appropriate contact time as indicated for disinfecting solutions.   I'm seeing this patient by the request  of:  Marin Olp, MD  CC: bilateral foot pain   [id number]  07/04/2021 Patient does have more bilateral foot pain.  Patient does have dry skin Contributing.  Discussed the potential of low sugars.  In addition to this does have a history of B12 deficiency that could also be contributing.  Patient on exam does have breakdown of the transverse arch and there is a possibility of a neuroma but patient does not have any pain on exam today.  In addition to this patient does does have signs and symptoms that could be consistent with a peripheral neuropathy.  If no significant improvement I do feel nerve conduction test as well as potentially laboratory work-up as well as ABIs would be beneficial.  Patient will follow up again after doing conservative therapy in 6 weeks  Update 08/20/2021 OZIEL BEITLER is a 86 y.o. male coming in with complaint of B foot pain. Patient states that he has burning sensation in both feet at metatarsal heads for 2 years. Patient has been rolling plantar fascia and strengthening foot muscles. Localized pain in R foot over middle metatarsal head. No pain with walking but had pain with massage to his foot.        Past Medical History:  Diagnosis Date   Abnormal stress test December 2013   s/p cardiac cath with minimal nonobstructive CAD and normal LV function; medical management recommended   Arthritis    CAD (coronary artery disease)    Dyslipidemia     Dysrhythmia    GERD (gastroesophageal reflux disease)    Heart murmur    History of hiatal hernia    History of kidney stones    Hypertension    Macular degeneration    Melanoma ([place])    skin   Obstructive sleep apnea 09/23/2007   Wore cpap previously. Stopped and not interested despite risks.      OSA (obstructive sleep apnea)    PAC (premature atrial contraction) 10/25/2015   PVC's (premature ventricular contractions) 09/27/2013   SVT (supraventricular tachycardia) ([place])    Syncope    Past Surgical History:  Procedure Laterality Date   APPENDECTOMY     CARDIAC CATHETERIZATION  December 2013   minimal nonobstructive CAD with normal LV function   CATARACT EXTRACTION     CYSTOSCOPY WITH LITHOLAPAXY N/A 07/13/2016   Procedure: CYSTOSCOPY WITH LITHOLAPAXY;  Surgeon: Franchot Gallo, MD;  Location: [facility];  Service: Urology;  Laterality: N/A;   CYSTOSCOPY/RETROGRADE/URETEROSCOPY/STONE EXTRACTION WITH BASKET  02/24/2012   Procedure: CYSTOSCOPY/RETROGRADE/URETEROSCOPY/STONE EXTRACTION WITH BASKET;  Surgeon: Franchot Gallo, MD;  Location: [facility];  Service: Urology;  Laterality: Bilateral;  1 HOUR   GASTRECTOMY     HEMORRHOID SURGERY     HERNIA REPAIR  07/13/2016   INGUINAL HERNIA REPAIR Left 07/13/2016   Procedure: REPAIR LEFT INGUINAL HERNIA;  Surgeon: Armandina Gemma, MD;  Location: [facility];  Service: General;  Laterality: Left;   INSERTION OF MESH Left 07/13/2016   Procedure: INSERTION OF MESH;  Surgeon:  Armandina Gemma, MD;  Location: [facility];  Service: General;  Laterality: Left;   MELANOMA EXCISION  05-05-2007   LEFT FOREARM   NOSE SURGERY     TONSILLECTOMY     TOTAL HIP ARTHROPLASTY Left 02/11/2021   Procedure: TOTAL HIP ARTHROPLASTY;  Surgeon: Marchia Bond, MD;  Location: [facility];  Service: Orthopedics;  Laterality: Left;   TOTAL SHOULDER ARTHROPLASTY Right 12/29/2017   TOTAL SHOULDER ARTHROPLASTY Right 12/29/2017   Procedure: TOTAL SHOULDER ARTHROPLASTY;   Surgeon: Hiram Gash, MD;  Location: [place];  Service: Orthopedics;  Laterality: Right;   Social History   Socioeconomic History   Marital status: Widowed    Spouse name: Not on file   Number of children: 1   Years of education: Not on file   Highest education level: Not on file  Occupational History   Occupation: retired  Tobacco Use   Smoking status: Former    Packs/day: 0.10    Years: 4.00    Pack years: 0.40    Types: Cigarettes    Quit date: 09/03/1956    Years since quitting: 65.0   Smokeless tobacco: Never  Vaping Use   Vaping Use: Never used  Substance and Sexual Activity   Alcohol use: No    Alcohol/week: 0.0 standard drinks   Drug use: No   Sexual activity: Yes  Other Topics Concern   Not on file  Social History Narrative   Widowed (lost wife 2015 from lung cancer never smoker), 1 son, 2 grandkids, 1 greatgrandkid (born on Christmas)      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       Hobbies: ride motorcycle (slingshot 3 year), rabbit and dear hunt   Social Determinants of Radio broadcast assistant Strain: Low Risk    Difficulty of Paying Living Expenses: Not hard at all  Food Insecurity: No Food Insecurity   Worried About Charity fundraiser in the Last Year: Never true   Arboriculturist in the Last Year: Never true  Transportation Needs: No Transportation Needs   Lack of Transportation (Medical): No   Lack of Transportation (Non-Medical): No  Physical Activity: Inactive   Days of Exercise per Week: 0 days   Minutes of Exercise per Session: 0 min  Stress: No Stress Concern Present   Feeling of Stress : Not at all  Social Connections: Moderately Integrated   Frequency of Communication with Friends and Family: More than three times a week   Frequency of Social Gatherings with Friends and Family: More than three times a week   Attends Religious Services: More than 4 times per year   Active Member of [employer] or Organizations: Yes    Attends Archivist Meetings: 1 to 4 times per year   Marital Status: Widowed   Allergies  Allergen Reactions   Aspirin Other (See Comments)    UPSET STOMACH   Percocet [Oxycodone-Acetaminophen] Nausea And Vomiting   Vitamins [Apatate] Other (See Comments)    Bothers stomach   Family History  Problem Relation Age of Onset   Heart disease Mother        CABG in her 80s   Pneumonia Father    Cancer Sister        breast   Lung cancer Brother        smoker     Current Outpatient Medications (Cardiovascular):    atorvastatin (LIPITOR) 20 MG tablet, TAKE ONE TABLET BY MOUTH THREE TIMES  A WEEK   losartan (COZAAR) 100 MG tablet, Take one-half tablet by mouth twice daily   metoprolol tartrate (LOPRESSOR) 25 MG tablet, Take 0.5 tablets (12.5 mg total) by mouth 2 (two) times daily.   Current Outpatient Medications (Analgesics):    acetaminophen (TYLENOL) 500 MG tablet, Take 500 mg by mouth 2 (two) times daily as needed for moderate pain or headache.  Current Outpatient Medications (Hematological):    Cyanocobalamin (B-12 COMPLIANCE INJECTION IJ), Inject 1 Dose as directed every 30 (thirty) days.  Current Outpatient Medications (Other):    bevacizumab (AVASTIN) 2.5 mg/0.1 mL SOLN, 2.5 mg by Intravitreal route. Injection to eyes every 5 weeks - use with vigamox   Carboxymeth-Glycerin-Polysorb (REFRESH OPTIVE ADVANCED OP), Place 1 drop into both eyes daily.   Carboxymethylcellulose Sodium 1 % GEL, Place 1 drop into both eyes daily.   metoCLOPramide (REGLAN) 5 MG tablet, Take 5 mg by mouth 2 (two) times daily.   pantoprazole (PROTONIX) 40 MG tablet, Take 40 mg by mouth 2 (two) times daily.   polyethylene glycol (MIRALAX / GLYCOLAX) 17 g packet, Take 17 g by mouth daily as needed for moderate constipation.   sennosides-docusate sodium (SENOKOT-S) 8.6-50 MG tablet, Take 2 tablets by mouth daily. (Patient taking differently: Take 2 tablets by mouth as needed.)   VIGAMOX 0.5 %  ophthalmic solution, Place 1 drop into both eyes as directed. Take every 5 weeks as directed - use with avastin, instill 1 drop into both eyes 4 times daily for 2 days after eye injections      Review of Systems:  No headache, visual changes, nausea, vomiting, diarrhea, constipation, dizziness, abdominal pain, skin rash, fevers, chills, night sweats, weight loss, swollen lymph nodes, body aches, joint swelling, chest pain, shortness of breath, mood changes. POSITIVE muscle aches  Objective  Blood pressure (!) 152/82, pulse 77, height 6\' 1"  (1.854 m), weight 183 lb (83 kg).   General: No apparent distress alert and oriented x3 mood and affect normal, dressed appropriately.  HEENT: Pupils equal, extraocular movements intact  Respiratory: Patient's speak in full sentences and does not appear short of breath  Cardiovascular: No lower extremity edema, non tender, no erythema  Gait mild wide based gait  Foot exam rigid midfoot  TTP on plantar aspect patient may have some peripheral neuropathy noted of the feet bilaterally with some mild numbness of the toes.  Breakdown of the transverse arch noted and overpronation of the hindfoot  Limited muscular skeletal ultrasound was performed and interpreted by Hulan Saas, M  Musculoskeletal ultrasound shows the patient does have some mild hypoechoic changes of multiple joints of the midfoot.  No cortical irregularity noted.  On the plantar aspect of the foot no masses appreciated but does have some very mild Doppler flow of the plantaris muscle noted but no true fibroma noted. Impression: Nonspecific findings of facet arthritis.    Impression and Recommendations:     The above documentation has been reviewed and is accurate and complete Lyndal Pulley, DO

## 2021-08-20 ENCOUNTER — Ambulatory Visit: Payer: Self-pay

## 2021-08-20 ENCOUNTER — Encounter: Payer: Self-pay | Admitting: Family Medicine

## 2021-08-20 ENCOUNTER — Other Ambulatory Visit: Payer: Self-pay | Admitting: Family Medicine

## 2021-08-20 ENCOUNTER — Ambulatory Visit (INDEPENDENT_AMBULATORY_CARE_PROVIDER_SITE_OTHER): Payer: Medicare Other

## 2021-08-20 ENCOUNTER — Ambulatory Visit (INDEPENDENT_AMBULATORY_CARE_PROVIDER_SITE_OTHER): Payer: Medicare Other | Admitting: Family Medicine

## 2021-08-20 VITALS — BP 152/82 | HR 77 | Ht 73.0 in | Wt 183.0 lb

## 2021-08-20 DIAGNOSIS — I2510 Atherosclerotic heart disease of native coronary artery without angina pectoris: Secondary | ICD-10-CM | POA: Diagnosis not present

## 2021-08-20 DIAGNOSIS — M79604 Pain in right leg: Secondary | ICD-10-CM

## 2021-08-20 DIAGNOSIS — M79671 Pain in right foot: Secondary | ICD-10-CM

## 2021-08-20 DIAGNOSIS — E559 Vitamin D deficiency, unspecified: Secondary | ICD-10-CM

## 2021-08-20 DIAGNOSIS — M79672 Pain in left foot: Secondary | ICD-10-CM | POA: Diagnosis not present

## 2021-08-20 DIAGNOSIS — E538 Deficiency of other specified B group vitamins: Secondary | ICD-10-CM | POA: Diagnosis not present

## 2021-08-20 DIAGNOSIS — M2550 Pain in unspecified joint: Secondary | ICD-10-CM | POA: Diagnosis not present

## 2021-08-20 LAB — COMPREHENSIVE METABOLIC PANEL
ALT: 15 U/L (ref 0–53)
AST: 20 U/L (ref 0–37)
Albumin: 4.5 g/dL (ref 3.5–5.2)
Alkaline Phosphatase: 60 U/L (ref 39–117)
BUN: 10 mg/dL (ref 6–23)
CO2: 33 mEq/L — ABNORMAL HIGH (ref 19–32)
Calcium: 10.3 mg/dL (ref 8.4–10.5)
Chloride: 101 mEq/L (ref 96–112)
Creatinine, Ser: 0.76 mg/dL (ref 0.40–1.50)
GFR: 82.4 mL/min (ref >60.00)
Glucose, Bld: 95 mg/dL (ref 70–99)
Potassium: 3.9 mEq/L (ref 3.5–5.1)
Sodium: 140 mEq/L (ref 135–145)
Total Bilirubin: 1.5 mg/dL — ABNORMAL HIGH (ref 0.2–1.2)
Total Protein: 7.2 g/dL (ref 6.0–8.3)

## 2021-08-20 LAB — VITAMIN D 25 HYDROXY (VIT D DEFICIENCY, FRACTURES): VITD: 34.92 ng/mL (ref 30.00–100.00)

## 2021-08-20 LAB — VITAMIN B12: Vitamin B-12: 883 pg/mL (ref 211–911)

## 2021-08-20 NOTE — Assessment & Plan Note (Signed)
Discussed with patient about potentially Gummies or oral drops.  Patient will consider it.  Patient is getting injections and is tolerating them well though.  We will recheck B12 to see where patient's levels are.

## 2021-08-20 NOTE — Assessment & Plan Note (Signed)
PatientContinue to have bilateral foot pain.  Patient is trying to make some progress.  His ultrasound does show that there may be some mild volume of fluid with no specific findings such as a fibroma that could be contributing.  He does have known arthritic changes and we will get x-rays.  Do feel at this point secondary to the tingling and numbness that I do think further evaluation with nerve conduction study as well as ABIs would be beneficial.  Depending on findings we can make certain suggestions and changes.  Follow-up with me otherwise in 6 to 8 weeks.  Hopefully patient will continue to make improvement.

## 2021-08-20 NOTE — Patient Instructions (Addendum)
Xray today Labs today  [facility]  ([facility] [employer] in [facility]) [address], [address] [place], [place] [address] [phone] 2:00pm 08/21/2021 arrive at 1:45pm  [facility] will call you to schedule OOFOS and HOKA recovery sandals  See me in 6-8 weeks

## 2021-08-21 ENCOUNTER — Other Ambulatory Visit: Payer: Self-pay | Admitting: Family Medicine

## 2021-08-21 ENCOUNTER — Ambulatory Visit (HOSPITAL_COMMUNITY)
Admission: RE | Admit: 2021-08-21 | Discharge: 2021-08-21 | Disposition: A | Discharge status: Home / Self Care | Payer: Medicare Other | Source: Ambulatory Visit | Attending: Cardiology | Admitting: Cardiology

## 2021-08-21 ENCOUNTER — Other Ambulatory Visit: Payer: Self-pay

## 2021-08-21 DIAGNOSIS — M79671 Pain in right foot: Secondary | ICD-10-CM | POA: Diagnosis not present

## 2021-08-21 DIAGNOSIS — M79604 Pain in right leg: Secondary | ICD-10-CM | POA: Insufficient documentation

## 2021-08-21 DIAGNOSIS — M79672 Pain in left foot: Secondary | ICD-10-CM | POA: Insufficient documentation

## 2021-08-26 NOTE — Progress Notes (Signed)
Chronic Care Management Pharmacy Note  09/02/2021 Name:  SLAYDE BRAULT MRN:  [id number] DOB:  1935/06/24  Subjective: Joseph Simpson is an 86 y.o. year old male who is a primary patient of Hunter, Brayton Mars, MD.  The CCM team was consulted for assistance with disease management and care coordination needs.    Engaged with patient by telephone for initial visit in response to provider referral for pharmacy case management and/or care coordination services.   Consent to Services:  The patient was given the following information about Chronic Care Management services today, agreed to services, and gave verbal consent: 1. CCM service includes personalized support from designated clinical staff supervised by the primary care provider, including individualized plan of care and coordination with other care providers 2. 24/7 contact phone numbers for assistance for urgent and routine care needs. 3. Service will only be billed when office clinical staff spend 20 minutes or more in a month to coordinate care. 4. Only one practitioner may furnish and bill the service in a calendar month. 5.The patient may stop CCM services at any time (effective at the end of the month) by phone call to the office staff. 6. The patient will be responsible for cost sharing (co-pay) of up to 20% of the service fee (after annual deductible is met). Patient agreed to services and consent obtained.  Patient Care Team: Marin Olp, MD as PCP - General (Family Medicine) Martinique, Peter M, MD as PCP - Cardiology (Cardiology) Martinique, Peter M, MD as Consulting Physician (Cardiology) Almedia Balls, MD as Referring Physician (Orthopedic Surgery) Breck Coons, MD as Consulting Physician (Internal Medicine) Jari Pigg, MD as Consulting Physician (Dermatology) Franchot Gallo, MD as Consulting Physician (Urology) Hayden Pedro, MD as Consulting Physician (Ophthalmology) Edythe Clarity, [facility] (Pharmacist)  Recent  office visits:  07/30/2021 OV (PCP) Marin Olp, MD; reduce metoprolol to 12.5 mg twice daily, stop Amlodipine as BP was running too low.   06/27/2021 OV (PCP) Marin Olp, MD; start Amlodipine 1.25 mg at bedtime and follow up in 1 month.   Recent consult visits:  07/28/2021 [facility] (cardiology) Almyra Deforest, PA-C; follow up CAD, no medication changes indicated.   07/04/2021 OV (sports medicine) Brownlee, DO; right foot pain, no medication changes indicated.   Hospital visits:  None in previous 6 months  Objective:  Lab Results  Component Value Date   CREATININE 0.76 08/20/2021   CREATININE 0.77 06/27/2021   CREATININE 0.69 02/12/2021    Lab Results  Component Value Date   HGBA1C 6.0 06/27/2021   Last diabetic Eye exam:  Lab Results  Component Value Date/Time   HMDIABEYEEXA No Retinopathy 01/16/2020 12:00 AM    Last diabetic Foot exam: No results found for: HMDIABFOOTEX      Component Value Date/Time   CHOL 125 12/25/2020 1015   TRIG 114.0 12/25/2020 1015   HDL 37.10 (L) 12/25/2020 1015   CHOLHDL 3 12/25/2020 1015   VLDL 22.8 12/25/2020 1015   LDLCALC 65 12/25/2020 1015   LDLDIRECT 83 06/19/2020 1033    Hepatic Function Latest Ref Rng & Units 08/20/2021 06/27/2021 12/25/2020  Total Protein 6.0 - 8.3 g/dL 7.2 7.2 7.4  Albumin 3.5 - 5.2 g/dL 4.5 4.5 4.5  AST 0 - 37 U/L '20 17 17  ' ALT 0 - 53 U/L '15 13 12  ' Alk Phosphatase 39 - 117 U/L 60 60 67  Total Bilirubin 0.2 - 1.2 mg/dL 1.5(H) 1.7(H) 1.2  Bilirubin, Direct  0.0 - 0.3 mg/dL - - -    Lab Results  Component Value Date/Time   TSH 4.27 06/27/2021 10:35 AM   TSH 2.46 08/12/2015 11:27 AM    CBC Latest Ref Rng & Units 06/27/2021 02/12/2021 01/30/2021  WBC 4.0 - 10.5 K/uL 5.2 10.8(H) 5.5  Hemoglobin 13.0 - 17.0 g/dL 15.8 12.6(L) 16.0  Hematocrit 39.0 - 52.0 % 46.3 37.3(L) 48.4  Platelets 150.0 - 400.0 K/uL 131.0(L) 147(L) 183    Lab Results  Component Value Date/Time   VD25OH 34.92 08/20/2021 10:42 AM     Clinical ASCVD:  The ASCVD Risk score (Arnett DK, et al., 2019) failed to calculate for the following reasons:   The 2019 ASCVD risk score is only valid for ages 40 to 56    Social History   Tobacco Use  Smoking Status Former   Packs/day: 0.10   Years: 4.00   Pack years: 0.40   Types: Cigarettes   Quit date: 09/03/1956   Years since quitting: 65.0  Smokeless Tobacco Never   BP Readings from Last 3 Encounters:  08/20/21 (!) 152/82  07/30/21 (!) 131/59  07/28/21 128/74   Pulse Readings from Last 3 Encounters:  08/20/21 77  07/30/21 60  07/28/21 (!) 59   Wt Readings from Last 3 Encounters:  08/20/21 183 lb (83 kg)  07/30/21 180 lb 12.8 oz (82 kg)  07/28/21 171 lb (77.6 kg)    Assessment: Review of patient past medical history, allergies, medications, health status, including review of consultants reports, laboratory and other test data, was performed as part of comprehensive evaluation and provision of chronic care management services.   SDOH:  (Social Determinants of Health) assessments and interventions performed: Yes  CCM Care Plan  Allergies  Allergen Reactions   Aspirin Other (See Comments)    UPSET STOMACH   Percocet [Oxycodone-Acetaminophen] Nausea And Vomiting   Vitamins [Apatate] Other (See Comments)    Bothers stomach    Medications Reviewed Today     Reviewed by Edythe Clarity, [facility] (Pharmacist) on 09/02/21 at 1044  Med List Status: <None>   Medication Order Taking? Sig Documenting Provider Last Dose Status Informant  acetaminophen (TYLENOL) 500 MG tablet [id number] Yes Take 500 mg by mouth 2 (two) times daily as needed for moderate pain or headache. [provider] Taking Active Self  atorvastatin (LIPITOR) 20 MG tablet [id number] Yes TAKE ONE TABLET BY MOUTH THREE TIMES A WEEK Marin Olp, MD Taking Active   bevacizumab (AVASTIN) 2.5 mg/0.1 mL SOLN [id number] Yes 2.5 mg by Intravitreal route. Injection to eyes every 5 weeks - use  with vigamox [provider] Taking Active Self           Med Note Willodean Rosenthal Apr 04, 2020  9:29 AM)    Carboxymeth-Glycerin-Polysorb [facility] OPTIVE ADVANCED OP) [id number] Yes Place 1 drop into both eyes daily. [provider] Taking Active Self  Carboxymethylcellulose Sodium 1 % GEL [id number] Yes Place 1 drop into both eyes daily. [provider] Taking Active Self  Cyanocobalamin (B-12 COMPLIANCE INJECTION IJ) [id number] Yes Inject 1 Dose as directed every 30 (thirty) days. [provider] Taking Active Self  losartan (COZAAR) 100 MG tablet [id number] Yes Take one-half tablet by mouth twice daily Marin Olp, MD Taking Active   metoCLOPramide (REGLAN) 5 MG tablet [id number] Yes Take 5 mg by mouth 2 (two) times daily. [provider] Taking Active Self  metoprolol tartrate (LOPRESSOR) 25 MG tablet [id number] Yes  Take 0.5 tablets (12.5 mg total) by mouth 2 (two) times daily. Marin Olp, MD Taking Active   pantoprazole (PROTONIX) 40 MG tablet [id number] Yes Take 40 mg by mouth 2 (two) times daily. [provider] Taking Active Self  polyethylene glycol (MIRALAX / GLYCOLAX) 17 g packet [id number] Yes Take 17 g by mouth daily as needed for moderate constipation. [provider] Taking Active Self  sennosides-docusate sodium (SENOKOT-S) 8.6-50 MG tablet [id number] Yes Take 2 tablets by mouth daily.  Patient taking differently: Take 2 tablets by mouth as needed.   Ventura Bruns, PA-C Taking Active   VIGAMOX 0.5 % ophthalmic solution [id number] Yes Place 1 drop into both eyes as directed. Take every 5 weeks as directed - use with avastin, instill 1 drop into both eyes 4 times daily for 2 days after eye injections [provider] Taking Active Self           Med Note Delorise Shiner   Wed Jul 01, 2016 10:46 AM)              Patient Active Problem List   Diagnosis Date Noted   Bilateral foot pain  07/04/2021   S/P hip replacement, left 02/11/2021   Hyperglycemia 12/21/2019   B12 deficiency 03/09/2019   Osteoarthritis of left hip 01/30/2019   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 12/29/2017   Syncope    History of hiatal hernia    Gastroparesis 02/15/2017   Hx of adenomatous colonic polyps 02/15/2017   Left inguinal hernia 07/12/2016   Hyperlipidemia 06/10/2016   PAC (premature atrial contraction) 10/25/2015   Squamous cell carcinoma in situ 09/09/2015   BPH (benign prostatic hyperplasia) 11/23/2014   Palpitations 07/31/2014   Cough 05/10/2014   Pulmonary nodule 05/10/2014   Dyspnea 02/27/2014   PVC's (premature ventricular contractions) 09/27/2013   CAD (coronary artery disease)    Abnormal stress test 10/16/2012   Murmur 02/10/2012   Macular degeneration 11/17/2011   Nephrolithiasis 09/04/2011   History of melanoma- arm 09/26/2007   Essential hypertension 09/23/2007   GERD 09/23/2007   Obstructive sleep apnea 09/23/2007    Immunization History  Administered Date(s) Administered   Fluad Quad(high Dose 65+) 07/28/2019, 08/13/2020, 07/30/2021   Influenza Split 09/04/2011, 08/19/2012   Influenza Whole 08/31/2007, 09/16/2008, 10/07/2009, 10/01/2010   Influenza, High Dose Seasonal PF 08/25/2013, 09/24/2015, 08/27/2016, 09/01/2017, 08/23/2018   Influenza,inj,Quad PF,6+ Mos 09/05/2014   Moderna Sars-Covid-2 Vaccination 10/04/2020   PFIZER(Purple Top)SARS-COV-2 Vaccination 12/25/2019, 01/15/2020   Pneumococcal Conjugate-13 11/23/2014   Pneumococcal Polysaccharide-23 10/16/2002   Td 03/04/1998, 10/09/2008   Tdap 07/07/2019   Zoster Recombinat (Shingrix) 05/17/2018, 07/19/2018   Zoster, Live 04/28/2011   Conditions to be addressed/monitored: HLD, HTN, Hyperglycemia, CAD, PAC, PVC, OSA, OA, GERD, B12 deficiency  Care Plan : [place]  Updates made by Edythe Clarity, RPH since 09/02/2021 12:00 AM     Problem: HLD, HTN,  Hyperglycemia, CAD, PAC, PVC, OSA, OA, GERD, B12 deficiency   Priority: High     Goal: Disease Management   Start Date: 02/19/2021  Expected End Date: 02/19/2022  Recent Progress: On track  Priority: High  Note:   Current Barriers:  working towards maintaining physical activity as tolerated and diet low in fat/carbs/sodium Slightly elevated BP  Pharmacist Clinical Goal(s):  Patient will contact provider office for questions/concerns as evidenced notation of same in electronic health record through collaboration with PharmD and provider.   Interventions: 1:1 collaboration with Marin Olp, MD  regarding development and update of comprehensive plan of care as evidenced by provider attestation and co-signature Inter-disciplinary care team collaboration (see longitudinal plan of care) Comprehensive medication review performed; medication list updated in electronic medical record No medication changes, monitoring BP  Hypertension (BP goal <130/80) -Controlled -OSA - previously used cpap, not interested in using  -Current treatment: Losartan 100 mg tablet - 50 mg twice daily (Dr Yong Channel) -Medications previously tried: lisinopril, amlodipine    -Current home readings: 117/61 most recently, ~120/60 on avg -Denies hypotensive/hypertensive symptoms -Educated on Daily salt intake goal < 2300 mg; Importance of home blood pressure monitoring; -Counseled to monitor BP at home once every 1-2 weeks, document, and provide log at future appointments -Recommended to continue current medication   Update 09/02/21 Home readings: 140-150/70s Counseled on BP goals.  Reviewed last office BP which was also elevated, however he was in pain.  Previously had to stop amlodipine due to dizziness.  Has also tried HCTZ in the past.  Have asked him to monitor and report consistent readings < 734 systolic.  He denies any dizziness at this time. Continue meds for now - if BP remains elevated may need to consider  low dose HCTZ.  PAC, PVCs (Goal: minimize side effects) -Controlled -HR has been in 50-60s while at rest  -Current treatment  Metoprolol tartrate 25 mg tablet twice daily (Dr Martinique) -Denies side effects  -Recommended to continue current medication  Hyperlipidemia: (LDL goal < 70) -Controlled -2013- s/p cardiac cath with minimal nonobstructive CAD. No ASA due to stomach history. -Current treatment: Atorvastatin 20 mg three times WEEKLY (2/3 90 DS) -Medications previously tried: atorvastatin 10 mg daily, atorvastatin 40 mg once WEEKLY  -Current dietary patterns: no added salt, minimal red meat, eating a lot -Current exercise habits: left hip replacement 02/11/2021. Has been able to walk around the house. Doesn't really due -Educated on Cholesterol goals;  Importance of limiting foods high in cholesterol; -Recommended to continue current medication  Update 09/02/21 Continues to be adherent to 3x per week dose.  Most recent LDL in Feb was controlled. Continue current medication. Recheck lipids at next CPE.  GERD, Gastroparesis (Goal: minimize symptoms) Reports Dr Erlene Quan is retiring and will need to get a new GI doctor, preference is to stay with [facility] at this time. Will let us know if wanting to see a [facility] provider.   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 Doing HH PT and will be doing PT in office 02/24/2021. Was discharged on: Senokot-S 8.6-50 mg once daily  Counseled on side effects, pain medication use, has not used tramadol in about three  days. Tylenol is go-to OTC pain medication, not otherwise using NSAIDs -Recommended to continue current medication   Patient Goals/Self-Care Activities Patient will:  - take medications as prescribed engage in dietary modifications by reducing intake of foods high in carbohydrates/fat/salt exercise as tolerated   Follow Up Plan: 1 month BP call, [place] telephone visit in ~6 months  Medication Assistance: None required.  Patient affirms current coverage meets needs.  Patient's preferred pharmacy is:  [id number] -  [place], [place] [address] Phone: [phone] Fax: [phone]  Follow Up:  Patient agrees to Care Plan and Follow-up.          Future Appointments  Date Time Provider [place]  09/04/2021  8:45 AM [facility] HEALTH COACH [facility]  09/11/2021 10:00 AM [facility] NURSE [facility]  09/16/2021  7:45 AM Hayden Pedro, MD [facility]TRE None  09/25/2021  8:00 AM Narda Amber K, DO [facility] None  10/01/2021 10:30 AM Lyndal Pulley, DO [facility] None  01/26/2022 10:00 AM Martinique, Peter M, MD [facility]  01/27/2022 10:40 AM Yong Channel Brayton Mars, MD [facility] PEC

## 2021-08-28 ENCOUNTER — Other Ambulatory Visit: Payer: Self-pay

## 2021-08-28 ENCOUNTER — Encounter: Payer: Self-pay | Admitting: Neurology

## 2021-08-28 DIAGNOSIS — R202 Paresthesia of skin: Secondary | ICD-10-CM

## 2021-09-02 ENCOUNTER — Ambulatory Visit (INDEPENDENT_AMBULATORY_CARE_PROVIDER_SITE_OTHER): Payer: Medicare Other | Admitting: Pharmacist

## 2021-09-02 DIAGNOSIS — E785 Hyperlipidemia, unspecified: Secondary | ICD-10-CM

## 2021-09-02 DIAGNOSIS — I10 Essential (primary) hypertension: Secondary | ICD-10-CM

## 2021-09-02 NOTE — Patient Instructions (Addendum)
Visit Information   Goals Addressed             This Visit's Progress    Track and Manage My Blood Pressure-Hypertension   On track    Timeframe:  Long-Range Goal Priority:  High Start Date:     09/02/21                        Expected End Date:  04/18/ 23                    Follow Up Date 12/03/21    - check blood pressure 3 times per week - choose a place to take my blood pressure (home, clinic or office, retail store) - write blood pressure results in a log or diary    Why is this important?   You won't feel high blood pressure, but it can still hurt your blood vessels.  High blood pressure can cause heart or kidney problems. It can also cause a stroke.  Making lifestyle changes like losing a little weight or eating less salt will help.  Checking your blood pressure at home and at different times of the day can help to control blood pressure.  If the doctor prescribes medicine remember to take it the way the doctor ordered.  Call the office if you cannot afford the medicine or if there are questions about it.     Notes:        Patient Care Plan: CCM Pharmacy Care Plan     Problem Identified: HLD, HTN, Hyperglycemia, CAD, PAC, PVC, OSA, OA, GERD, B12 deficiency   Priority: High     Goal: Disease Management   Start Date: 02/19/2021  Expected End Date: 02/19/2022  Recent Progress: On track  Priority: High  Note:   Current Barriers:  working towards maintaining physical activity as tolerated and diet low in fat/carbs/sodium Slightly elevated BP  Pharmacist Clinical Goal(s):  Patient will contact provider office for questions/concerns as evidenced notation of same in electronic health record through collaboration with PharmD and provider.   Interventions: 1:1 collaboration with Marin Olp, MD regarding development and update of comprehensive plan of care as evidenced by provider attestation and co-signature Inter-disciplinary care team collaboration (see  longitudinal plan of care) Comprehensive medication review performed; medication list updated in electronic medical record No medication changes, monitoring BP  Hypertension (BP goal <130/80) -Controlled -OSA - previously used cpap, not interested in using  -Current treatment: Losartan 100 mg tablet - 50 mg twice daily (Dr Yong Channel) -Medications previously tried: lisinopril, amlodipine    -Current home readings: 117/61 most recently, ~120/60 on avg -Denies hypotensive/hypertensive symptoms -Educated on Daily salt intake goal < 2300 mg; Importance of home blood pressure monitoring; -Counseled to monitor BP at home once every 1-2 weeks, document, and provide log at future appointments -Recommended to continue current medication   Update 09/02/21 Home readings: 140-150/70s Counseled on BP goals.  Reviewed last office BP which was also elevated, however he was in pain.  Previously had to stop amlodipine due to dizziness.  Has also tried HCTZ in the past.  Have asked him to monitor and report consistent readings < 811 systolic.  He denies any dizziness at this time. Continue meds for now - if BP remains elevated may need to consider low dose HCTZ.  PAC, PVCs (Goal: minimize side effects) -Controlled -HR has been in 50-60s while at rest  -Current treatment  Metoprolol tartrate 25 mg  tablet twice daily (Dr Martinique) -Denies side effects  -Recommended to continue current medication  Hyperlipidemia: (LDL goal < 70) -Controlled -2013- s/p cardiac cath with minimal nonobstructive CAD. No ASA due to stomach history. -Current treatment: Atorvastatin 20 mg three times WEEKLY (2/3 90 DS) -Medications previously tried: atorvastatin 10 mg daily, atorvastatin 40 mg once WEEKLY  -Current dietary patterns: no added salt, minimal red meat, eating a lot -Current exercise habits: left hip replacement 02/11/2021. Has been able to walk around the house. Doesn't really due -Educated on Cholesterol goals;   Importance of limiting foods high in cholesterol; -Recommended to continue current medication  Update 09/02/21 Continues to be adherent to 3x per week dose.  Most recent LDL in Feb was controlled. Continue current medication. Recheck lipids at next CPE.  GERD, Gastroparesis (Goal: minimize symptoms) Reports Dr Erlene Quan is retiring and will need to get a new GI doctor, preference is to stay with [facility] at this time. Will let us know if wanting to see a [facility] provider.   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 Doing HH PT and will be doing PT in office 02/24/2021. Was discharged on: Senokot-S 8.6-50 mg once daily  Counseled on side effects, pain medication use, has not used tramadol in about three days. Tylenol is go-to OTC pain medication, not otherwise using NSAIDs -Recommended to continue current medication   Patient Goals/Self-Care Activities Patient will:  - take medications as prescribed engage in dietary modifications by reducing intake of foods high in carbohydrates/fat/salt exercise as tolerated   Follow Up Plan: 1 month BP call, [place] telephone visit in ~6 months  Medication Assistance: None required.  Patient affirms current coverage meets needs.  Patient's preferred pharmacy is:  [facility] [address] [place] [address] [place] [address] Phone: [phone] Fax: [phone]  Follow Up:  Patient agrees to Care Plan and Follow-up.         Patient verbalizes understanding of  instructions provided today and agrees to view in [place].  Telephone follow up appointment with pharmacy team member scheduled for: 6 months  Edythe Clarity, [place]

## 2021-09-04 ENCOUNTER — Ambulatory Visit (INDEPENDENT_AMBULATORY_CARE_PROVIDER_SITE_OTHER): Payer: Medicare Other

## 2021-09-04 DIAGNOSIS — Z0000 Encounter for general adult medical examination without abnormal findings: Secondary | ICD-10-CM | POA: Diagnosis not present

## 2021-09-04 DIAGNOSIS — H0288B Meibomian gland dysfunction left eye, upper and lower eyelids: Secondary | ICD-10-CM | POA: Diagnosis not present

## 2021-09-04 DIAGNOSIS — H0288A Meibomian gland dysfunction right eye, upper and lower eyelids: Secondary | ICD-10-CM | POA: Diagnosis not present

## 2021-09-04 NOTE — Patient Instructions (Signed)
[name] , Thank you for taking time to come for your Medicare Wellness Visit. I appreciate your ongoing commitment to your health goals. Please review the following plan we discussed and let me know if I can assist you in the future.   Screening recommendations/referrals: Colonoscopy: No longer required  Recommended yearly ophthalmology/optometry visit for glaucoma screening and checkup Recommended yearly dental visit for hygiene and checkup  Vaccinations: Influenza vaccine: Done 07/30/21 repeat every year  Pneumococcal vaccine: Up to date Tdap vaccine: Done 07/07/19 repeat every 10 years Shingles vaccine: Completed 7/2 & 07/19/18   Covid-19: Completed 2/8, 3/1, & 10/04/20  Advanced directives: Please bring a copy of your health care power of attorney and living will to the office at your convenience.  Conditions/risks identified: None at this time  Next appointment: Follow up in one year for your annual wellness visit.   Preventive Care 84 Years and Older, Male Preventive care refers to lifestyle choices and visits with your health care provider that can promote health and wellness. What does preventive care include? A yearly physical exam. This is also called an annual well check. Dental exams once or twice a year. Routine eye exams. Ask your health care provider how often you should have your eyes checked. Personal lifestyle choices, including: Daily care of your teeth and gums. Regular physical activity. Eating a healthy diet. Avoiding tobacco and drug use. Limiting alcohol use. Practicing safe sex. Taking low doses of aspirin every day. Taking vitamin and mineral supplements as recommended by your health care provider. What happens during an annual well check? The services and screenings done by your health care provider during your annual well check will depend on your age, overall health, lifestyle risk factors, and family history of disease. Counseling  Your health care  provider may ask you questions about your: Alcohol use. Tobacco use. Drug use. Emotional well-being. Home and relationship well-being. Sexual activity. Eating habits. History of falls. Memory and ability to understand (cognition). Work and work Statistician. Screening  You may have the following tests or measurements: Height, weight, and BMI. Blood pressure. Lipid and cholesterol levels. These may be checked every 5 years, or more frequently if you are over 41 years old. Skin check. Lung cancer screening. You may have this screening every year starting at age 96 if you have a 30-pack-year history of smoking and currently smoke or have quit within the past 15 years. Fecal occult blood test (FOBT) of the stool. You may have this test every year starting at age 67. Flexible sigmoidoscopy or colonoscopy. You may have a sigmoidoscopy every 5 years or a colonoscopy every 10 years starting at age 42. Prostate cancer screening. Recommendations will vary depending on your family history and other risks. Hepatitis C blood test. Hepatitis B blood test. Sexually transmitted disease (STD) testing. Diabetes screening. This is done by checking your blood sugar (glucose) after you have not eaten for a while (fasting). You may have this done every 1-3 years. Abdominal aortic aneurysm (AAA) screening. You may need this if you are a current or former smoker. Osteoporosis. You may be screened starting at age 81 if you are at high risk. Talk with your health care provider about your test results, treatment options, and if necessary, the need for more tests. Vaccines  Your health care provider may recommend certain vaccines, such as: Influenza vaccine. This is recommended every year. Tetanus, diphtheria, and acellular pertussis (Tdap, Td) vaccine. You may need a Td booster every 10 years.  Zoster vaccine. You may need this after age 48. Pneumococcal 13-valent conjugate (PCV13) vaccine. One dose is  recommended after age 62. Pneumococcal polysaccharide (PPSV23) vaccine. One dose is recommended after age 23. Talk to your health care provider about which screenings and vaccines you need and how often you need them. This information is not intended to replace advice given to you by your health care provider. Make sure you discuss any questions you have with your health care provider. Document Released: 11/29/2015 Document Revised: 07/22/2016 Document Reviewed: 09/03/2015 Elsevier Interactive Patient Education  2017 [place] Prevention in the Home Falls can cause injuries. They can happen to people of all ages. There are many things you can do to make your home safe and to help prevent falls. What can I do on the outside of my home? Regularly fix the edges of walkways and driveways and fix any cracks. Remove anything that might make you trip as you walk through a door, such as a raised step or threshold. Trim any bushes or trees on the path to your home. Use bright outdoor lighting. Clear any walking paths of anything that might make someone trip, such as rocks or tools. Regularly check to see if handrails are loose or broken. Make sure that both sides of any steps have handrails. Any raised decks and porches should have guardrails on the edges. Have any leaves, snow, or ice cleared regularly. Use sand or salt on walking paths during winter. Clean up any spills in your garage right away. This includes oil or grease spills. What can I do in the bathroom? Use night lights. Install grab bars by the toilet and in the tub and shower. Do not use towel bars as grab bars. Use non-skid mats or decals in the tub or shower. If you need to sit down in the shower, use a plastic, non-slip stool. Keep the floor dry. Clean up any water that spills on the floor as soon as it happens. Remove soap buildup in the tub or shower regularly. Attach bath mats securely with double-sided non-slip rug  tape. Do not have throw rugs and other things on the floor that can make you trip. What can I do in the bedroom? Use night lights. Make sure that you have a light by your bed that is easy to reach. Do not use any sheets or blankets that are too big for your bed. They should not hang down onto the floor. Have a firm chair that has side arms. You can use this for support while you get dressed. Do not have throw rugs and other things on the floor that can make you trip. What can I do in the kitchen? Clean up any spills right away. Avoid walking on wet floors. Keep items that you use a lot in easy-to-reach places. If you need to reach something above you, use a strong step stool that has a grab bar. Keep electrical cords out of the way. Do not use floor polish or wax that makes floors slippery. If you must use wax, use non-skid floor wax. Do not have throw rugs and other things on the floor that can make you trip. What can I do with my stairs? Do not leave any items on the stairs. Make sure that there are handrails on both sides of the stairs and use them. Fix handrails that are broken or loose. Make sure that handrails are as long as the stairways. Check any carpeting to make sure that  it is firmly attached to the stairs. Fix any carpet that is loose or worn. Avoid having throw rugs at the top or bottom of the stairs. If you do have throw rugs, attach them to the floor with carpet tape. Make sure that you have a light switch at the top of the stairs and the bottom of the stairs. If you do not have them, ask someone to add them for you. What else can I do to help prevent falls? Wear shoes that: Do not have high heels. Have rubber bottoms. Are comfortable and fit you well. Are closed at the toe. Do not wear sandals. If you use a stepladder: Make sure that it is fully opened. Do not climb a closed stepladder. Make sure that both sides of the stepladder are locked into place. Ask someone to  hold it for you, if possible. Clearly mark and make sure that you can see: Any grab bars or handrails. First and last steps. Where the edge of each step is. Use tools that help you move around (mobility aids) if they are needed. These include: Canes. Walkers. Scooters. Crutches. Turn on the lights when you go into a dark area. Replace any light bulbs as soon as they burn out. Set up your furniture so you have a clear path. Avoid moving your furniture around. If any of your floors are uneven, fix them. If there are any pets around you, be aware of where they are. Review your medicines with your doctor. Some medicines can make you feel dizzy. This can increase your chance of falling. Ask your doctor what other things that you can do to help prevent falls. This information is not intended to replace advice given to you by your health care provider. Make sure you discuss any questions you have with your health care provider. Document Released: 08/29/2009 Document Revised: 04/09/2016 Document Reviewed: 12/07/2014 Elsevier Interactive Patient Education  2017 [employer].

## 2021-09-04 NOTE — Progress Notes (Addendum)
Virtual Visit via Telephone Note  I connected with  Joseph Simpson on 09/04/21 at  8:45 AM EDT by telephone and verified that I am speaking with the correct person using two identifiers.  Medicare Annual Wellness visit completed telephonically due to Covid-19 pandemic.   Persons participating in this call: This Health Coach and this patient.   Location: Patient: Home Provider: Office   I discussed the limitations, risks, security and privacy concerns of performing an evaluation and management service by telephone and the availability of in person appointments. The patient expressed understanding and agreed to proceed.  Unable to perform video visit due to video visit attempted and failed and/or patient does not have video capability.   Some vital signs may be absent or patient reported.   Joseph Brace, LPN   Subjective:   Joseph Simpson is a 86 y.o. male who presents for Medicare Annual/Subsequent preventive examination.  Review of Systems     Cardiac Risk Factors include: advanced age (>84men, >72 women);hypertension;dyslipidemia;male gender     Objective:    There were no vitals filed for this visit. There is no height or weight on file to calculate BMI.  Advanced Directives 09/04/2021 02/11/2021 01/30/2021 08/29/2020 07/28/2019 06/16/2018 12/30/2017  Does Patient Have a Medical Advance Directive? Yes Yes Yes Yes Yes Yes Yes  Type of Paramedic of [place];Living will [place];Living will [place];Living will [place];Living will - Living will  Does patient want to make changes to medical advance directive? - No - Patient declined - - No - Patient declined - No - Patient declined  Copy of [place] in Chart? No - copy requested No - copy requested - No - copy requested No - copy requested - -  Would patient like information on creating a medical  advance directive? - - - - - - -  Pre-existing out of facility DNR order (yellow form or pink MOST form) - - - - - - -    Current Medications (verified) Outpatient Encounter Medications as of 09/04/2021  Medication Sig   acetaminophen (TYLENOL) 500 MG tablet Take 500 mg by mouth 2 (two) times daily as needed for moderate pain or headache.   atorvastatin (LIPITOR) 20 MG tablet TAKE ONE TABLET BY MOUTH THREE TIMES A WEEK   bevacizumab (AVASTIN) 2.5 mg/0.1 mL SOLN 2.5 mg by Intravitreal route. Injection to eyes every 5 weeks - use with vigamox   Carboxymeth-Glycerin-Polysorb (REFRESH OPTIVE ADVANCED OP) Place 1 drop into both eyes daily.   Carboxymethylcellulose Sodium 1 % GEL Place 1 drop into both eyes daily.   Cyanocobalamin (B-12 COMPLIANCE INJECTION IJ) Inject 1 Dose as directed every 30 (thirty) days.   losartan (COZAAR) 100 MG tablet Take one-half tablet by mouth twice daily   metoCLOPramide (REGLAN) 5 MG tablet Take 5 mg by mouth 2 (two) times daily.   metoprolol tartrate (LOPRESSOR) 25 MG tablet Take 0.5 tablets (12.5 mg total) by mouth 2 (two) times daily.   pantoprazole (PROTONIX) 40 MG tablet Take 40 mg by mouth 2 (two) times daily.   polyethylene glycol (MIRALAX / GLYCOLAX) 17 g packet Take 17 g by mouth daily as needed for moderate constipation.   sennosides-docusate sodium (SENOKOT-S) 8.6-50 MG tablet Take 2 tablets by mouth daily. (Patient taking differently: Take 2 tablets by mouth as needed.)   VIGAMOX 0.5 % ophthalmic solution Place 1 drop into both eyes as  directed. Take every 5 weeks as directed - use with avastin, instill 1 drop into both eyes 4 times daily for 2 days after eye injections   No facility-administered encounter medications on file as of 09/04/2021.    Allergies (verified) Aspirin, Percocet [oxycodone-acetaminophen], and Vitamins [apatate]   History: Past Medical History:  Diagnosis Date   Abnormal stress test December 2013   s/p cardiac cath with  minimal nonobstructive CAD and normal LV function; medical management recommended   Arthritis    CAD (coronary artery disease)    Dyslipidemia    Dysrhythmia    GERD (gastroesophageal reflux disease)    Heart murmur    History of hiatal hernia    History of kidney stones    Hypertension    Macular degeneration    Melanoma ([place])    skin   Obstructive sleep apnea 09/23/2007   Wore cpap previously. Stopped and not interested despite risks.      OSA (obstructive sleep apnea)    PAC (premature atrial contraction) 10/25/2015   PVC's (premature ventricular contractions) 09/27/2013   SVT (supraventricular tachycardia) ([place])    Syncope    Past Surgical History:  Procedure Laterality Date   APPENDECTOMY     CARDIAC CATHETERIZATION  December 2013   minimal nonobstructive CAD with normal LV function   CATARACT EXTRACTION     CYSTOSCOPY WITH LITHOLAPAXY N/A 07/13/2016   Procedure: CYSTOSCOPY WITH LITHOLAPAXY;  Surgeon: Franchot Gallo, MD;  Location: [facility];  Service: Urology;  Laterality: N/A;   CYSTOSCOPY/RETROGRADE/URETEROSCOPY/STONE EXTRACTION WITH BASKET  02/24/2012   Procedure: CYSTOSCOPY/RETROGRADE/URETEROSCOPY/STONE EXTRACTION WITH BASKET;  Surgeon: Franchot Gallo, MD;  [facility];  Service: Urology;  Laterality: Bilateral;  1 HOUR   GASTRECTOMY     HEMORRHOID SURGERY     HERNIA REPAIR  07/13/2016   INGUINAL HERNIA REPAIR Left 07/13/2016   Procedure: REPAIR LEFT INGUINAL HERNIA;  Surgeon: Armandina Gemma, MD;  Location: [facility];  Service: General;  Laterality: Left;   INSERTION OF MESH Left 07/13/2016   Procedure: INSERTION OF MESH;  Surgeon: Armandina Gemma, MD;  Location: [facility];  Service: General;  Laterality: Left;   MELANOMA EXCISION  05-05-2007   LEFT FOREARM   NOSE SURGERY     TONSILLECTOMY     TOTAL HIP ARTHROPLASTY Left 02/11/2021   Procedure: TOTAL HIP ARTHROPLASTY;  Surgeon: Marchia Bond, MD;  Location: [facility];  Service: Orthopedics;  Laterality:  Left;   TOTAL SHOULDER ARTHROPLASTY Right 12/29/2017   TOTAL SHOULDER ARTHROPLASTY Right 12/29/2017   Procedure: TOTAL SHOULDER ARTHROPLASTY;  Surgeon: Hiram Gash, MD;  Location: [place];  Service: Orthopedics;  Laterality: Right;   Family History  Problem Relation Age of Onset   Heart disease Mother        CABG in her 22s   Pneumonia Father    Cancer Sister        breast   Lung cancer Brother        smoker   Social History   Socioeconomic History   Marital status: Widowed    Spouse name: Not on file   Number of children: 1   Years of education: Not on file   Highest education level: Not on file  Occupational History   Occupation: retired  Tobacco Use   Smoking status: Former    Packs/day: 0.10    Years: 4.00    Pack years: 0.40    Types: Cigarettes    Quit date: 09/03/1956    Years since  quitting: 65.0   Smokeless tobacco: Never  Vaping Use   Vaping Use: Never used  Substance and Sexual Activity   Alcohol use: No    Alcohol/week: 0.0 standard drinks   Drug use: No   Sexual activity: Yes  Other Topics Concern   Not on file  Social History Narrative   Widowed (lost wife 2015 from lung cancer never smoker), 1 son, 2 grandkids, 1 greatgrandkid (born on Christmas)      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       Hobbies: ride motorcycle (slingshot 3 year), rabbit and dear hunt   Social Determinants of Radio broadcast assistant Strain: Low Risk    Difficulty of Paying Living Expenses: Not hard at all  Food Insecurity: No Food Insecurity   Worried About Charity fundraiser in the Last Year: Never true   Arboriculturist in the Last Year: Never true  Transportation Needs: No Transportation Needs   Lack of Transportation (Medical): No   Lack of Transportation (Non-Medical): No  Physical Activity: Sufficiently Active   Days of Exercise per Week: 5 days   Minutes of Exercise per Session: 30 min  Stress: No Stress Concern Present   Feeling of  Stress : Not at all  Social Connections: Moderately Isolated   Frequency of Communication with Friends and Family: More than three times a week   Frequency of Social Gatherings with Friends and Family: More than three times a week   Attends Religious Services: More than 4 times per year   Active Member of [employer] or Organizations: No   Attends Archivist Meetings: Never   Marital Status: Widowed    Tobacco Counseling Counseling given: Not Answered   Clinical Intake:  Pre-visit preparation completed: Yes  Pain : No/denies pain     BMI - recorded: 24.15 Nutritional Status: BMI of 19-24  Normal Nutritional Risks: None Diabetes: No  How often do you need to have someone help you when you read instructions, pamphlets, or other written materials from your doctor or pharmacy?: 1 - Never  Diabetic?No  Interpreter Needed?: No  Information entered by :: Charlott Rakes, LPN   Activities of Daily Living In your present state of health, do you have any difficulty performing the following activities: 09/04/2021 02/11/2021  Hearing? N N  Vision? N N  Difficulty concentrating or making decisions? N N  Walking or climbing stairs? N Y  Dressing or bathing? N N  Doing errands, shopping? N N  Preparing Food and eating ? N -  Using the Toilet? N -  In the past six months, have you accidently leaked urine? N -  Do you have problems with loss of bowel control? N -  Managing your Medications? N -  Managing your Finances? N -  Housekeeping or managing your Housekeeping? N -  Some recent data might be hidden    Patient Care Team: Marin Olp, MD as PCP - General (Family Medicine) [place]ique, Peter M, MD as PCP - Cardiology (Cardiology) [place], Peter M, MD as Consulting Physician (Cardiology) Almedia Balls, MD as Referring Physician (Orthopedic Surgery) Breck Coons, MD as Consulting Physician (Internal Medicine) Jari Pigg, MD as Consulting Physician  (Dermatology) Franchot Gallo, MD as Consulting Physician (Urology) Hayden Pedro, MD as Consulting Physician (Ophthalmology) Edythe Clarity, [facility] (Pharmacist)  Indicate any recent Medical Services you may have received from other than [facility] providers in the past year (date may  be approximate).     Assessment:   This is a routine wellness examination for [place].  Hearing/Vision screen Hearing Screening - Comments:: Pt denies any hearing issues  Vision Screening - Comments:: Pt follows up with Dr Herbert Deaner and Dr Zigmund Daniel for annual eye exams   Dietary issues and exercise activities discussed: Current Exercise Habits: Home exercise routine, Type of exercise: walking (around the house), Time (Minutes): 30, Frequency (Times/Week): 5, Weekly Exercise (Minutes/Week): 150   Goals Addressed             This Visit's Progress    Patient Stated       None at this time        Depression Screen PHQ 2/9 Scores 09/04/2021 07/30/2021 06/27/2021 12/25/2020 08/29/2020 12/21/2019 06/20/2019  PHQ - 2 Score 0 0 0 0 0 0 0    Fall Risk Fall Risk  09/04/2021 07/30/2021 06/27/2021 12/25/2020 08/29/2020  Falls in the past year? 0 0 0 0 0  Number falls in past yr: 0 - 0 0 0  Injury with Fall? 0 - 0 0 0  Risk for fall due to : Impaired vision - No Fall Risks - -  Follow up Falls prevention discussed - Falls evaluation completed - Falls prevention discussed    FALL RISK PREVENTION PERTAINING TO THE HOME:  Any stairs in or around the home? Yes  If so, are there any without handrails? No  Home free of loose throw rugs in walkways, pet beds, electrical cords, etc? Yes  Adequate lighting in your home to reduce risk of falls? Yes   ASSISTIVE DEVICES UTILIZED TO PREVENT FALLS:  Life alert? No  Use of a cane, walker or w/c? No  Grab bars in the bathroom? Yes  Shower chair or bench in shower? No  Elevated toilet seat or a handicapped toilet? No   TIMED UP AND GO:  Was the test performed? No .    Cognitive Function: MMSE - Mini Mental State Exam 06/16/2018  Not completed: (No Data)     6CIT Screen 09/04/2021 08/29/2020 07/28/2019  What Year? 0 points 0 points 0 points  What month? 0 points 0 points 0 points  What time? 0 points - 0 points  Count back from 20 0 points 0 points 0 points  Months in reverse 0 points 0 points 0 points  Repeat phrase 0 points 0 points 0 points  Total Score 0 - 0    Immunizations Immunization History  Administered Date(s) Administered   Fluad Quad(high Dose 65+) 07/28/2019, 08/13/2020, 07/30/2021   Influenza Split 09/04/2011, 08/19/2012   Influenza Whole 08/31/2007, 09/16/2008, 10/07/2009, 10/01/2010   Influenza, High Dose Seasonal PF 08/25/2013, 09/24/2015, 08/27/2016, 09/01/2017, 08/23/2018   Influenza,inj,Quad PF,6+ Mos 09/05/2014   Moderna Sars-Covid-2 Vaccination 10/04/2020   PFIZER(Purple Top)SARS-COV-2 Vaccination 12/25/2019, 01/15/2020   Pneumococcal Conjugate-13 11/23/2014   Pneumococcal Polysaccharide-23 10/16/2002   Td 03/04/1998, 10/09/2008   Tdap 07/07/2019   Zoster Recombinat (Shingrix) 05/17/2018, 07/19/2018   Zoster, Live 04/28/2011    TDAP status: Up to date  Flu Vaccine status: Up to date  Pneumococcal vaccine status: Up to date  Covid-19 vaccine status: Completed vaccines  Qualifies for Shingles Vaccine? Yes   Zostavax completed Yes   Shingrix Completed?: Yes  Screening Tests Health Maintenance  Topic Date Due   COVID-19 Vaccine (4 - Booster) 11/29/2020   TETANUS/TDAP  07/06/2029   Pneumonia Vaccine 104+ Years old  Completed   INFLUENZA VACCINE  Completed   Zoster Vaccines- Shingrix  Completed  HPV VACCINES  Aged Out    Health Maintenance  Health Maintenance Due  Topic Date Due   COVID-19 Vaccine (4 - Booster) 11/29/2020    Colorectal cancer screening: No longer required.    Additional Screening:   Vision Screening: Recommended annual ophthalmology exams for early detection of glaucoma and  other disorders of the eye. Is the patient up to date with their annual eye exam?   Who is the provider or what is the name of the office in which the patient attends annual eye exams? Dr Herbert Deaner and Dr Zigmund Daniel  If pt is not established with a provider, would they like to be referred to a provider to establish care? No .   Dental Screening: Recommended annual dental exams for proper oral hygiene  Community Resource Referral / Chronic Care Management: CRR required this visit?  No   CCM required this visit?  No      Plan:     I have personally reviewed and noted the following in the patient's chart:   Medical and social history Use of alcohol, tobacco or illicit drugs  Current medications and supplements including opioid prescriptions. Patient is not currently taking opioid prescriptions. Functional ability and status Nutritional status Physical activity Advanced directives List of other physicians Hospitalizations, surgeries, and ER visits in previous 12 months Vitals Screenings to include cognitive, depression, and falls Referrals and appointments  In addition, I have reviewed and discussed with patient certain preventive protocols, quality metrics, and best practice recommendations. A written personalized care plan for preventive services as well as general preventive health recommendations were provided to patient.     Joseph Brace, LPN   [id number]   Nurse Notes: None

## 2021-09-11 ENCOUNTER — Ambulatory Visit (INDEPENDENT_AMBULATORY_CARE_PROVIDER_SITE_OTHER): Payer: Medicare Other

## 2021-09-11 ENCOUNTER — Other Ambulatory Visit: Payer: Self-pay

## 2021-09-11 DIAGNOSIS — E538 Deficiency of other specified B group vitamins: Secondary | ICD-10-CM | POA: Diagnosis not present

## 2021-09-11 MED ORDER — CYANOCOBALAMIN 1000 MCG/ML IJ SOLN
1000.0000 ug | Freq: Once | INTRAMUSCULAR | Status: AC
  Administered 2021-09-11: 1000 ug via INTRAMUSCULAR

## 2021-09-11 NOTE — Progress Notes (Signed)
After obtaining consent, and per orders of Dr. Yong Channel, injection of Vitamin B 12  given by Loralyn Freshwater.

## 2021-09-15 DIAGNOSIS — I10 Essential (primary) hypertension: Secondary | ICD-10-CM | POA: Diagnosis not present

## 2021-09-15 DIAGNOSIS — E785 Hyperlipidemia, unspecified: Secondary | ICD-10-CM | POA: Diagnosis not present

## 2021-09-16 ENCOUNTER — Encounter (INDEPENDENT_AMBULATORY_CARE_PROVIDER_SITE_OTHER): Payer: Medicare Other | Admitting: Ophthalmology

## 2021-09-16 ENCOUNTER — Other Ambulatory Visit: Payer: Self-pay

## 2021-09-16 DIAGNOSIS — H35033 Hypertensive retinopathy, bilateral: Secondary | ICD-10-CM

## 2021-09-16 DIAGNOSIS — D3132 Benign neoplasm of left choroid: Secondary | ICD-10-CM

## 2021-09-16 DIAGNOSIS — I10 Essential (primary) hypertension: Secondary | ICD-10-CM | POA: Diagnosis not present

## 2021-09-16 DIAGNOSIS — H353231 Exudative age-related macular degeneration, bilateral, with active choroidal neovascularization: Secondary | ICD-10-CM

## 2021-09-16 DIAGNOSIS — H43813 Vitreous degeneration, bilateral: Secondary | ICD-10-CM | POA: Diagnosis not present

## 2021-09-18 ENCOUNTER — Other Ambulatory Visit: Payer: Self-pay | Admitting: Family Medicine

## 2021-09-18 DIAGNOSIS — H0288B Meibomian gland dysfunction left eye, upper and lower eyelids: Secondary | ICD-10-CM | POA: Diagnosis not present

## 2021-09-18 DIAGNOSIS — H04123 Dry eye syndrome of bilateral lacrimal glands: Secondary | ICD-10-CM | POA: Diagnosis not present

## 2021-09-18 DIAGNOSIS — H0288A Meibomian gland dysfunction right eye, upper and lower eyelids: Secondary | ICD-10-CM | POA: Diagnosis not present

## 2021-09-19 DIAGNOSIS — L814 Other melanin hyperpigmentation: Secondary | ICD-10-CM | POA: Diagnosis not present

## 2021-09-19 DIAGNOSIS — Z87898 Personal history of other specified conditions: Secondary | ICD-10-CM | POA: Diagnosis not present

## 2021-09-19 DIAGNOSIS — D2271 Melanocytic nevi of right lower limb, including hip: Secondary | ICD-10-CM | POA: Diagnosis not present

## 2021-09-19 DIAGNOSIS — L821 Other seborrheic keratosis: Secondary | ICD-10-CM | POA: Diagnosis not present

## 2021-09-19 DIAGNOSIS — L578 Other skin changes due to chronic exposure to nonionizing radiation: Secondary | ICD-10-CM | POA: Diagnosis not present

## 2021-09-19 DIAGNOSIS — L570 Actinic keratosis: Secondary | ICD-10-CM | POA: Diagnosis not present

## 2021-09-19 DIAGNOSIS — D485 Neoplasm of uncertain behavior of skin: Secondary | ICD-10-CM | POA: Diagnosis not present

## 2021-09-19 DIAGNOSIS — Z23 Encounter for immunization: Secondary | ICD-10-CM | POA: Diagnosis not present

## 2021-09-19 DIAGNOSIS — D045 Carcinoma in situ of skin of trunk: Secondary | ICD-10-CM | POA: Diagnosis not present

## 2021-09-19 DIAGNOSIS — Z85828 Personal history of other malignant neoplasm of skin: Secondary | ICD-10-CM | POA: Diagnosis not present

## 2021-09-19 DIAGNOSIS — D2230 Melanocytic nevi of unspecified part of face: Secondary | ICD-10-CM | POA: Diagnosis not present

## 2021-09-19 DIAGNOSIS — D225 Melanocytic nevi of trunk: Secondary | ICD-10-CM | POA: Diagnosis not present

## 2021-09-21 ENCOUNTER — Telehealth: Payer: Self-pay | Admitting: Pharmacist

## 2021-09-21 NOTE — Chronic Care Management (AMB) (Signed)
Chronic Care Management Pharmacy Assistant   Name: Joseph Simpson  MRN: [id number] DOB: 06-Jul-1935   Reason for Encounter: Hypertension Adherence Call    Recent office visits:  None  Recent consult visits:  None  Hospital visits:  None in previous 6 months  Medications: Outpatient Encounter Medications as of 09/21/2021  Medication Sig   acetaminophen (TYLENOL) 500 MG tablet Take 500 mg by mouth 2 (two) times daily as needed for moderate pain or headache.   atorvastatin (LIPITOR) 20 MG tablet TAKE ONE TABLET BY MOUTH THREE TIMES A WEEK   bevacizumab (AVASTIN) 2.5 mg/0.1 mL SOLN 2.5 mg by Intravitreal route. Injection to eyes every 5 weeks - use with vigamox   Carboxymeth-Glycerin-Polysorb (REFRESH OPTIVE ADVANCED OP) Place 1 drop into both eyes daily.   Carboxymethylcellulose Sodium 1 % GEL Place 1 drop into both eyes daily.   Cyanocobalamin (B-12 COMPLIANCE INJECTION IJ) Inject 1 Dose as directed every 30 (thirty) days.   losartan (COZAAR) 100 MG tablet Take one-half tablet by mouth twice daily   metoCLOPramide (REGLAN) 5 MG tablet Take 5 mg by mouth 2 (two) times daily.   metoprolol tartrate (LOPRESSOR) 25 MG tablet Take 0.5 tablets (12.5 mg total) by mouth 2 (two) times daily.   pantoprazole (PROTONIX) 40 MG tablet Take 40 mg by mouth 2 (two) times daily.   polyethylene glycol (MIRALAX / GLYCOLAX) 17 g packet Take 17 g by mouth daily as needed for moderate constipation.   sennosides-docusate sodium (SENOKOT-S) 8.6-50 MG tablet Take 2 tablets by mouth daily. (Patient taking differently: Take 2 tablets by mouth as needed.)   VIGAMOX 0.5 % ophthalmic solution Place 1 drop into both eyes as directed. Take every 5 weeks as directed - use with avastin, instill 1 drop into both eyes 4 times daily for 2 days after eye injections   No facility-administered encounter medications on file as of 09/21/2021.    Reviewed chart prior to disease state call. Spoke with patient regarding  BP  Recent Office Vitals: BP Readings from Last 3 Encounters:  08/20/21 (!) 152/82  07/30/21 (!) 131/59  07/28/21 128/74   Pulse Readings from Last 3 Encounters:  08/20/21 77  07/30/21 60  07/28/21 (!) 59    Wt Readings from Last 3 Encounters:  08/20/21 183 lb (83 kg)  07/30/21 180 lb 12.8 oz (82 kg)  07/28/21 171 lb (77.6 kg)     Kidney Function Lab Results  Component Value Date/Time   CREATININE 0.76 08/20/2021 10:42 AM   CREATININE 0.77 06/27/2021 10:35 AM   CREATININE 0.80 06/19/2020 10:33 AM   GFR 82.40 08/20/2021 10:42 AM   GFRNONAA >60 02/12/2021 03:18 AM   GFRAA >60 12/15/2017 12:56 PM    BMP Latest Ref Rng & Units 08/20/2021 06/27/2021 02/12/2021  Glucose 70 - 99 mg/dL 95 102(H) 143(H)  BUN 6 - 23 mg/dL 10 10 13   Creatinine 0.40 - 1.50 mg/dL 0.76 0.77 0.69  BUN/Creat Ratio 6 - 22 (calc) - - -  Sodium 135 - 145 mEq/L 140 142 139  Potassium 3.5 - 5.1 mEq/L 3.9 4.0 3.9  Chloride 96 - 112 mEq/L 101 102 107  CO2 19 - 32 mEq/L 33(H) 31 25  Calcium 8.4 - 10.5 mg/dL 10.3 9.6 8.6(L)    Current antihypertensive regimen:  Losartan 100 mg daily Metoprolol 25 mg two times daily  How often are you checking your Blood Pressure? 1-2x per week  Current home BP readings: 150-160's/75-80  What recent interventions/DTPs have  been made by any provider to improve Blood Pressure control since last CPP Visit: No recent interventions/DTPs.  Any recent hospitalizations or ED visits since last visit with CPP? No  What diet changes have been made to improve Blood Pressure Control?  Patient states he eats a healthy diet.  What exercise is being done to improve your Blood Pressure Control?  Patient states he like to walk for exercise.  Adherence Review: Is the patient currently on ACE/ARB medication? Yes Does the patient have >5 day gap between last estimated fill dates? No   Care Gaps: Medicare Annual Wellness: Completed Hemoglobin A1C: 6.0% on 06/27/2021 Colonoscopy: Aged  out  Future Appointments  Date Time Provider [place]  10/01/2021 10:30 AM Lyndal Pulley, DO [facility] None  10/14/2021  7:45 AM Hayden Pedro, MD [facility] None  10/14/2021 10:45 AM [facility] NURSE [facility]EC  01/26/2022 10:00 AM Martinique, Peter M, MD [facility]  01/27/2022 10:40 AM Marin Olp, MD [facility] PEC  03/17/2022  3:45 PM [facility] CCM PHARMACIST [facility]EC  09/17/2022  8:45 AM [facility] HEALTH COACH [facility]    Star Rating Drugs: Atorvastatin 20 mg last filled 09/18/2021 90 DS Losartan 100 mg last filled 09/18/2021 90 DS  April D Calhoun, [place] Pharmacist Assistant [phone]

## 2021-09-25 ENCOUNTER — Other Ambulatory Visit: Payer: Self-pay

## 2021-09-25 ENCOUNTER — Ambulatory Visit (INDEPENDENT_AMBULATORY_CARE_PROVIDER_SITE_OTHER): Payer: Medicare Other | Admitting: Neurology

## 2021-09-25 DIAGNOSIS — R202 Paresthesia of skin: Secondary | ICD-10-CM

## 2021-09-25 DIAGNOSIS — G629 Polyneuropathy, unspecified: Secondary | ICD-10-CM

## 2021-09-25 NOTE — Procedures (Signed)
[facility] Neurology  [address]  [place], [place] [address] Tel: [phone] Fax:  [phone] Test Date:  09/25/2021  Patient: Joseph Simpson DOB: 16-Jan-1935 Physician: Narda Amber, DO  Sex: Male Height: 6\' 1"  Ref Phys: Hulan Saas, DO  ID#: [id number]   Technician:    Patient Complaints: This is a 86 year old man referred for evaluation of bilateral feet burning pain.  NCV & EMG Findings: Extensive electrodiagnostic testing of the right lower extremity and additional studies of the left shows:  Bilateral superficial peroneal sensory responses are absent.  Bilateral sural sensory responses are within normal limits. Bilateral peroneal motor responses are absent at the extensor digitorum brevis, and normal at the tibialis anterior.  Bilateral tibial motor responses show reduced amplitude. Bilateral tibial H reflex studies are absent. Chronic motor axonal loss changes are seen affecting the muscles below the knee bilaterally, without accompanied active denervation.  Impression: The electrophysiologic findings are consistent with a chronic and symmetric sensorimotor axonal polyneuropathy affecting the lower extremities.   ___________________________ Narda Amber, DO    Nerve Conduction Studies Anti Sensory Summary Table   Stim Site NR Peak (ms) Norm Peak (ms) P-T Amp (V) Norm P-T Amp  Left Sup Peroneal Anti Sensory (Ant Lat Mall)  32C  12 cm NR  <4.6  >3  Right Sup Peroneal Anti Sensory (Ant Lat Mall)  32C  12 cm NR  <4.6  >3  Left Sural Anti Sensory (Lat Mall)  32C  Calf    3.3 <4.6 4.4 >3  Right Sural Anti Sensory (Lat Mall)  32C  Calf    3.7 <4.6 4.1 >3   Motor Summary Table   Stim Site NR Onset (ms) Norm Onset (ms) O-P Amp (mV) Norm O-P Amp Site1 Site2 Delta-0 (ms) Dist (cm) Vel (m/s) Norm Vel (m/s)  Left Peroneal Motor (Ext Dig Brev)  32C  Ankle NR  <6.0  >2.5 B Fib Ankle  0.0  >40  B Fib NR     Poplt B Fib  0.0  >40  Poplt NR             Right Peroneal Motor (Ext Dig Brev)  32C  Ankle NR  <6.0  >2.5 B Fib Ankle  0.0  >40  B Fib NR     Poplt B Fib  0.0  >40  Poplt NR            Left Peroneal TA Motor (Tib Ant)  32C  Fib Head    3.8 <4.5 3.8 >3 Poplit Fib Head 1.8 10.0 56 >40  Poplit    5.6  3.5         Right Peroneal TA Motor (Tib Ant)  32C  Fib Head    3.8 <4.5 4.1 >3 Poplit Fib Head 1.5 10.0 67 >40  Poplit    5.3  4.0         Left Tibial Motor (Abd Hall Brev)  32C  Ankle    4.8 <6.0 3.1 >4 Knee Ankle 11.0 45.0 41 >40  Knee    15.8  2.2         Right Tibial Motor (Abd Hall Brev)  32C  Ankle    4.5 <6.0 1.6 >4 Knee Ankle 11.8 48.0 41 >40  Knee    16.3  1.1          H Reflex Studies   NR H-Lat (ms) Lat Norm (ms) L-R H-Lat (ms)  Left Tibial (Gastroc)  32C  NR  <  35   Right Tibial (Gastroc)  32C  NR  <35    EMG   Side Muscle Ins Act Fibs Psw Fasc Number Recrt Dur Dur. Amp Amp. Poly Poly. Comment  Right AntTibialis Nml Nml Nml Nml 2- Rapid Many 1+ Many 1+ Many 1+ N/A  Right Gastroc Nml Nml Nml Nml 1- Rapid Some 1+ Some 1+ Some 1+ N/A  Right Flex Dig Long Nml Nml Nml Nml 2- Rapid Many 1+ Many 1+ Many 1+ N/A  Right RectFemoris Nml Nml Nml Nml Nml Nml Nml Nml Nml Nml Nml Nml N/A  Right GluteusMed Nml Nml Nml Nml Nml Nml Nml Nml Nml Nml Nml Nml N/A  Right BicepsFemS Nml Nml Nml Nml Nml Nml Nml Nml Nml Nml Nml Nml N/A  Left BicepsFemS Nml Nml Nml Nml Nml Nml Nml Nml Nml Nml Nml Nml N/A  Left AntTibialis Nml Nml Nml Nml 2- Rapid Many 1+ Many 1+ Many 1+ N/A  Left Gastroc Nml Nml Nml Nml 1- Rapid Some 1+ Some 1+ Some 1+ N/A  Left RectFemoris Nml Nml Nml Nml Nml Nml Nml Nml Nml Nml Nml Nml N/A  Left GluteusMed Nml Nml Nml Nml Nml Nml Nml Nml Nml Nml Nml Nml N/A      Waveforms:

## 2021-09-30 NOTE — Progress Notes (Signed)
[place] Phone: [phone] Subjective:   [name], am serving as a scribe for Dr. Hulan Saas. This visit occurred during the SARS-CoV-2 public health emergency.  Safety protocols were in place, including screening questions prior to the visit, additional usage of staff PPE, and extensive cleaning of exam room while observing appropriate contact time as indicated for disinfecting solutions.   I'm seeing this patient by the request  of:  Marin Olp, MD  CC: Bilateral lower extremity pain  HCW:[id number]  08/20/2021 PatientContinue to have bilateral foot pain.  Patient is trying to make some progress.  His ultrasound does show that there may be some mild volume of fluid with no specific findings such as a fibroma that could be contributing.  He does have known arthritic changes and we will get x-rays.  Do feel at this point secondary to the tingling and numbness that I do think further evaluation with nerve conduction study as well as ABIs would be beneficial.  Depending on findings we can make certain suggestions and changes.  Follow-up with me otherwise in 6 to 8 weeks.  Hopefully patient will continue to make improvement.  Discussed with patient about potentially Gummies or oral drops.  Patient will consider it.  Patient is getting injections and is tolerating them well though.  We will recheck B12 to see where patient's levels are.  Update 10/01/2021 BERNADETTE ARMIJO is a 86 y.o. male coming in with complaint of B foot pain. (-) foot xray 09/20/2021. Patient states that he is doing better. Little to no pain in either foot.  Patient did have a nerve conduction study showing the patient does have more peripheral neuropathy than truly any lumbar radiculopathy.  Patient's ABIs were also unremarkable.  Patient does state though that he is having more wrist pain.  States that he has been moving a lot of boxes and  doing repetitive activity.  Feels like that is exacerbated.  Sometimes pushing himself out of a chair can give more discomfort as well.     Past Medical History:  Diagnosis Date   Abnormal stress test December 2013   s/p cardiac cath with minimal nonobstructive CAD and normal LV function; medical management recommended   Arthritis    CAD (coronary artery disease)    Dyslipidemia    Dysrhythmia    GERD (gastroesophageal reflux disease)    Heart murmur    History of hiatal hernia    History of kidney stones    Hypertension    Macular degeneration    Melanoma ([place])    skin   Obstructive sleep apnea 09/23/2007   Wore cpap previously. Stopped and not interested despite risks.      OSA (obstructive sleep apnea)    PAC (premature atrial contraction) 10/25/2015   PVC's (premature ventricular contractions) 09/27/2013   SVT (supraventricular tachycardia) ([place])    Syncope    Past Surgical History:  Procedure Laterality Date   APPENDECTOMY     CARDIAC CATHETERIZATION  December 2013   minimal nonobstructive CAD with normal LV function   CATARACT EXTRACTION     CYSTOSCOPY WITH LITHOLAPAXY N/A 07/13/2016   Procedure: CYSTOSCOPY WITH LITHOLAPAXY;  Surgeon: Franchot Gallo, MD;  Location: [facility];  Service: Urology;  Laterality: N/A;   CYSTOSCOPY/RETROGRADE/URETEROSCOPY/STONE EXTRACTION WITH BASKET  02/24/2012   Procedure: CYSTOSCOPY/RETROGRADE/URETEROSCOPY/STONE EXTRACTION WITH BASKET;  Surgeon: Franchot Gallo, MD;  Location: [facility];  Service: Urology;  Laterality:  Bilateral;  1 HOUR   GASTRECTOMY     HEMORRHOID SURGERY     HERNIA REPAIR  07/13/2016   INGUINAL HERNIA REPAIR Left 07/13/2016   Procedure: REPAIR LEFT INGUINAL HERNIA;  Surgeon: Armandina Gemma, MD;  Location: [facility];  Service: General;  Laterality: Left;   INSERTION OF MESH Left 07/13/2016   Procedure: INSERTION OF MESH;  Surgeon: Armandina Gemma, MD;  Location: [facility];  Service: General;  Laterality: Left;    MELANOMA EXCISION  05-05-2007   LEFT FOREARM   NOSE SURGERY     TONSILLECTOMY     TOTAL HIP ARTHROPLASTY Left 02/11/2021   Procedure: TOTAL HIP ARTHROPLASTY;  Surgeon: Marchia Bond, MD;  Location: [facility];  Service: Orthopedics;  Laterality: Left;   TOTAL SHOULDER ARTHROPLASTY Right 12/29/2017   TOTAL SHOULDER ARTHROPLASTY Right 12/29/2017   Procedure: TOTAL SHOULDER ARTHROPLASTY;  Surgeon: Hiram Gash, MD;  Location: [facility];  Service: Orthopedics;  Laterality: Right;   Social History   Socioeconomic History   Marital status: Widowed    Spouse name: Not on file   Number of children: 1   Years of education: Not on file   Highest education level: Not on file  Occupational History   Occupation: retired  Tobacco Use   Smoking status: Former    Packs/day: 0.10    Years: 4.00    Pack years: 0.40    Types: Cigarettes    Quit date: 09/03/1956    Years since quitting: 47.1   Smokeless tobacco: Never  Vaping Use   Vaping Use: Never used  Substance and Sexual Activity   Alcohol use: No    Alcohol/week: 0.0 standard drinks   Drug use: No   Sexual activity: Yes  Other Topics Concern   Not on file  Social History Narrative   Widowed (lost wife 2015 from lung cancer never smoker), 1 son, 2 grandkids, 1 greatgrandkid (born on Christmas)      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       Hobbies: ride motorcycle (slingshot 3 year), rabbit and dear hunt   Social Determinants of Radio broadcast assistant Strain: Low Risk    Difficulty of Paying Living Expenses: Not hard at all  Food Insecurity: No Food Insecurity   Worried About Charity fundraiser in the Last Year: Never true   Arboriculturist in the Last Year: Never true  Transportation Needs: No Transportation Needs   Lack of Transportation (Medical): No   Lack of Transportation (Non-Medical): No  Physical Activity: Sufficiently Active   Days of Exercise per Week: 5 days   Minutes of Exercise per Session: 30  min  Stress: No Stress Concern Present   Feeling of Stress : Not at all  Social Connections: Moderately Isolated   Frequency of Communication with Friends and Family: More than three times a week   Frequency of Social Gatherings with Friends and Family: More than three times a week   Attends Religious Services: More than 4 times per year   Active Member of [employer] or Organizations: No   Attends Archivist Meetings: Never   Marital Status: Widowed   Allergies  Allergen Reactions   Aspirin Other (See Comments)    UPSET STOMACH   Percocet [Oxycodone-Acetaminophen] Nausea And Vomiting   Vitamins [Apatate] Other (See Comments)    Bothers stomach   Family History  Problem Relation Age of Onset   Heart disease Mother  CABG in her 59s   Pneumonia Father    Cancer Sister        breast   Lung cancer Brother        smoker     Current Outpatient Medications (Cardiovascular):    atorvastatin (LIPITOR) 20 MG tablet, TAKE ONE TABLET BY MOUTH THREE TIMES A WEEK   losartan (COZAAR) 100 MG tablet, Take one-half tablet by mouth twice daily   metoprolol tartrate (LOPRESSOR) 25 MG tablet, Take 0.5 tablets (12.5 mg total) by mouth 2 (two) times daily.   Current Outpatient Medications (Analgesics):    acetaminophen (TYLENOL) 500 MG tablet, Take 500 mg by mouth 2 (two) times daily as needed for moderate pain or headache.  Current Outpatient Medications (Hematological):    Cyanocobalamin (B-12 COMPLIANCE INJECTION IJ), Inject 1 Dose as directed every 30 (thirty) days.  Current Outpatient Medications (Other):    bevacizumab (AVASTIN) 2.5 mg/0.1 mL SOLN, 2.5 mg by Intravitreal route. Injection to eyes every 5 weeks - use with vigamox   Carboxymeth-Glycerin-Polysorb (REFRESH OPTIVE ADVANCED OP), Place 1 drop into both eyes daily.   Carboxymethylcellulose Sodium 1 % GEL, Place 1 drop into both eyes daily.   metoCLOPramide (REGLAN) 5 MG tablet, Take 5 mg by mouth 2 (two) times daily.    pantoprazole (PROTONIX) 40 MG tablet, Take 40 mg by mouth 2 (two) times daily.   polyethylene glycol (MIRALAX / GLYCOLAX) 17 g packet, Take 17 g by mouth daily as needed for moderate constipation.   sennosides-docusate sodium (SENOKOT-S) 8.6-50 MG tablet, Take 2 tablets by mouth daily. (Patient taking differently: Take 2 tablets by mouth as needed.)   VIGAMOX 0.5 % ophthalmic solution, Place 1 drop into both eyes as directed. Take every 5 weeks as directed - use with avastin, instill 1 drop into both eyes 4 times daily for 2 days after eye injections   Reviewed prior external information including notes and imaging from  primary care provider As well as notes that were available from care everywhere and other healthcare systems.  Past medical history, social, surgical and family history all reviewed in electronic medical record.  No pertanent information unless stated regarding to the chief complaint.   Review of Systems:  No headache, visual changes, nausea, vomiting, diarrhea, constipation, dizziness, abdominal pain, skin rash, fevers, chills, night sweats, weight loss, swollen lymph nodes, body aches, joint swelling, chest pain, shortness of breath, mood changes. POSITIVE muscle aches  Objective  Blood pressure 132/72, pulse 60, height 6\' 1"  (1.854 m), weight 178 lb (80.7 kg), SpO2 97 %.   General: No apparent distress alert and oriented x3 mood and affect normal, dressed appropriately.  HEENT: Pupils equal, extraocular movements intact  Respiratory: Patient's speak in full sentences and does not appear short of breath  Cardiovascular: No lower extremity edema, non tender, no erythema  Gait very mildly shuffling gait but able to get up and down out of a chair with no significant difficulties and neurovascularly intact distally.  Left hand exam shows the patient does have some mild swelling over the TFCC.  And some limited range of motion in flexion extension of the wrist noted.  No pain  over the ECU.  Good grip strength noted.  Limited muscular skeletal ultrasound was performed and interpreted by Hulan Saas, M  Limited ultrasound shows the patient does have arthritic changes noted of the wrist.  He does have some degenerative changes of the TFCC noted.   [id number]; 15 additional minutes spent for Therapeutic exercises as  stated in above notes.  This included exercises focusing on stretching, strengthening, with significant focus on eccentric aspects.   Long term goals include an improvement in range of motion, strength, endurance as well as avoiding reinjury. Patient's frequency would include in 1-2 times a day, 3-5 times a week for a duration of 6-12 weeks. Proper technique shown and discussed handout in great detail with ATC.  All questions were discussed and answered.     Impression and Recommendations:     The above documentation has been reviewed and is accurate and complete Lyndal Pulley, DO

## 2021-10-01 ENCOUNTER — Ambulatory Visit: Payer: Self-pay

## 2021-10-01 ENCOUNTER — Ambulatory Visit (INDEPENDENT_AMBULATORY_CARE_PROVIDER_SITE_OTHER): Payer: Medicare Other | Admitting: Family Medicine

## 2021-10-01 ENCOUNTER — Other Ambulatory Visit: Payer: Self-pay

## 2021-10-01 ENCOUNTER — Encounter: Payer: Self-pay | Admitting: Family Medicine

## 2021-10-01 VITALS — BP 132/72 | HR 60 | Ht 73.0 in | Wt 178.0 lb

## 2021-10-01 DIAGNOSIS — M79672 Pain in left foot: Secondary | ICD-10-CM | POA: Diagnosis not present

## 2021-10-01 DIAGNOSIS — M25532 Pain in left wrist: Secondary | ICD-10-CM | POA: Insufficient documentation

## 2021-10-01 DIAGNOSIS — M79671 Pain in right foot: Secondary | ICD-10-CM | POA: Diagnosis not present

## 2021-10-01 DIAGNOSIS — I2510 Atherosclerotic heart disease of native coronary artery without angina pectoris: Secondary | ICD-10-CM

## 2021-10-01 NOTE — Assessment & Plan Note (Signed)
Multifactorial with the breakdown of the foot but also the B12 deficiency and the peripheral neuropathy.  Responding well to the B12 injections as well as continuing the different over-the-counter orthotics and recovery sandals.  No significant changes in management with patient doing well and follow-up again in 3 months

## 2021-10-01 NOTE — Patient Instructions (Signed)
TFCC arthritis Wrist brace day and night for one week, nightly for 2 weeks Leg pain is peripheral neuropathy Continue B12 injections See me again in 2 months

## 2021-10-01 NOTE — Assessment & Plan Note (Signed)
Seems to be underlying arthritic changes.  Given a wrist brace to wear at night.  We will discussed home exercises that I think will be beneficial.  Would normally get x-rays but unfortunately x-ray is down today and patient does not want to come back.  Encourage patient that we need to consider doing this in the near future to further evaluate the arthritis.  Follow-up with me again 6 to 8 weeks after the conservative therapy.

## 2021-10-14 ENCOUNTER — Encounter (INDEPENDENT_AMBULATORY_CARE_PROVIDER_SITE_OTHER): Payer: Medicare Other | Admitting: Ophthalmology

## 2021-10-14 ENCOUNTER — Ambulatory Visit: Payer: Medicare Other

## 2021-10-14 ENCOUNTER — Other Ambulatory Visit: Payer: Self-pay

## 2021-10-14 DIAGNOSIS — I10 Essential (primary) hypertension: Secondary | ICD-10-CM

## 2021-10-14 DIAGNOSIS — H35033 Hypertensive retinopathy, bilateral: Secondary | ICD-10-CM | POA: Diagnosis not present

## 2021-10-14 DIAGNOSIS — H353231 Exudative age-related macular degeneration, bilateral, with active choroidal neovascularization: Secondary | ICD-10-CM

## 2021-10-14 DIAGNOSIS — H33301 Unspecified retinal break, right eye: Secondary | ICD-10-CM | POA: Diagnosis not present

## 2021-10-14 DIAGNOSIS — D3132 Benign neoplasm of left choroid: Secondary | ICD-10-CM | POA: Diagnosis not present

## 2021-10-14 DIAGNOSIS — H43813 Vitreous degeneration, bilateral: Secondary | ICD-10-CM | POA: Diagnosis not present

## 2021-10-23 ENCOUNTER — Other Ambulatory Visit: Payer: Self-pay

## 2021-10-23 ENCOUNTER — Ambulatory Visit (INDEPENDENT_AMBULATORY_CARE_PROVIDER_SITE_OTHER): Payer: Medicare Other

## 2021-10-23 DIAGNOSIS — E538 Deficiency of other specified B group vitamins: Secondary | ICD-10-CM | POA: Diagnosis not present

## 2021-10-23 MED ORDER — CYANOCOBALAMIN 1000 MCG/ML IJ SOLN
1000.0000 ug | Freq: Once | INTRAMUSCULAR | Status: AC
Start: 2021-10-23 — End: 2021-10-23
  Administered 2021-10-23: 1000 ug via INTRAMUSCULAR

## 2021-10-23 NOTE — Progress Notes (Signed)
Monthly B 12 given in right deltoid per patient preference. Patient tolerated well.

## 2021-10-29 DIAGNOSIS — N210 Calculus in bladder: Secondary | ICD-10-CM | POA: Diagnosis not present

## 2021-11-05 ENCOUNTER — Other Ambulatory Visit: Payer: Self-pay | Admitting: Urology

## 2021-11-05 ENCOUNTER — Telehealth: Payer: Self-pay | Admitting: Cardiology

## 2021-11-05 NOTE — Telephone Encounter (Signed)
° °  Primary Cardiologist: Peter Martinique, MD  Chart reviewed as part of pre-operative protocol coverage. Given past medical history and time since last visit, based on ACC/AHA guidelines, Joseph Simpson would be at acceptable risk for the planned procedure without further cardiovascular testing.    RCRI indicates class I risk, 0.4% risk of major cardiac event. Pt reports METS >4.   Patient was advised that if he develops new symptoms prior to surgery to contact our office to arrange a follow-up appointment.  He verbalized understanding.  I will route this recommendation to the requesting party via Epic fax function and remove from pre-op pool.  Please call with questions.  Lenna Sciara, NP 11/05/2021, 10:27 AM

## 2021-11-05 NOTE — Telephone Encounter (Signed)
° °  Pre-operative Risk Assessment    Patient Name: Joseph Simpson  DOB: 14-Feb-1935 MRN: [id number]      Request for Surgical Clearance    Procedure:   Cystolitholapaxy  Date of Surgery:  Clearance 12/01/20                                 Surgeon:  Verdis Prime Surgeon's Group or Practice Name:  [facility]  Phone number:  [phone] Fax number:  [phone]   Type of Clearance Requested:   - Medical    Type of Anesthesia:  General    Additional requests/questions:   n/a  Signed, Kamira J Martinique   11/05/2021, 10:14 AM

## 2021-11-17 ENCOUNTER — Other Ambulatory Visit: Payer: Self-pay

## 2021-11-17 ENCOUNTER — Encounter (INDEPENDENT_AMBULATORY_CARE_PROVIDER_SITE_OTHER): Payer: Medicare Other | Admitting: Ophthalmology

## 2021-11-17 DIAGNOSIS — H353231 Exudative age-related macular degeneration, bilateral, with active choroidal neovascularization: Secondary | ICD-10-CM

## 2021-11-17 DIAGNOSIS — H43813 Vitreous degeneration, bilateral: Secondary | ICD-10-CM

## 2021-11-17 DIAGNOSIS — I10 Essential (primary) hypertension: Secondary | ICD-10-CM

## 2021-11-17 DIAGNOSIS — H35033 Hypertensive retinopathy, bilateral: Secondary | ICD-10-CM

## 2021-11-17 DIAGNOSIS — D3132 Benign neoplasm of left choroid: Secondary | ICD-10-CM | POA: Diagnosis not present

## 2021-11-19 DIAGNOSIS — L570 Actinic keratosis: Secondary | ICD-10-CM | POA: Diagnosis not present

## 2021-11-19 DIAGNOSIS — Z23 Encounter for immunization: Secondary | ICD-10-CM | POA: Diagnosis not present

## 2021-11-19 DIAGNOSIS — L309 Dermatitis, unspecified: Secondary | ICD-10-CM | POA: Diagnosis not present

## 2021-11-19 DIAGNOSIS — D045 Carcinoma in situ of skin of trunk: Secondary | ICD-10-CM | POA: Diagnosis not present

## 2021-11-21 DIAGNOSIS — Z23 Encounter for immunization: Secondary | ICD-10-CM | POA: Diagnosis not present

## 2021-11-25 ENCOUNTER — Encounter (HOSPITAL_BASED_OUTPATIENT_CLINIC_OR_DEPARTMENT_OTHER): Payer: Self-pay | Admitting: Urology

## 2021-11-25 ENCOUNTER — Other Ambulatory Visit: Payer: Self-pay

## 2021-11-25 ENCOUNTER — Ambulatory Visit: Payer: Medicare Other

## 2021-11-25 NOTE — Progress Notes (Signed)
Spoke w/ via phone for pre-op interview--- pt Lab needs dos---- [employer] results------ current ekg in epic/ chart COVID test -----patient states asymptomatic no test needed Arrive at ------- 0730 on 12-01-2021 NPO after MN NO Solid Food.  Clear liquids from MN until--- 0630 Med rec completed Medications to take morning of surgery ----- lopressor, protonix, reglan Diabetic medication ----- n/a Patient instructed no nail polish to be worn day of surgery Patient instructed to bring photo id and insurance card day of surgery Patient aware to have Driver (ride ) / caregiver   for 24 hours after surgery --son, ronnie Patient Special Instructions ----- n/a Pre-Op special Istructions ----- pt has telephone cardiac clearance by Diona Browner NP on 11-05-2022 in epic/ chart Patient verbalized understanding of instructions that were given at this phone interview. Patient denies shortness of breath, chest pain, fever, cough at this phone interview.    Anesthesia Review: HTN; min. Nonob cad;  PVCs; OSA no cpap; Gastroparesis Pt denies cardiac s&s, sob, and no peripheral swelling  PCP: Dr Chauncey Cruel. [place] Cardiologist : Dr Martinique (lov 07-28-2021 epic) EKG : 07-28-2021 epic Echo : 04-16-2009 epic Event monitor:  06/ 2010 epic Stress test: 10-05-2012 epic Cardiac Cath : 10-21-2012 epic Activity level: denies sob w/ any activity Sleep Study/ CPAP : Yes/ no Blood Thinner/ Instructions /Last Dose:no ASA / Instructions/ Last Dose :  no

## 2021-11-30 NOTE — H&P (Signed)
H&P  Chief Complaint: Bladder stones  History of Present Illness: 87 yo male w/ pror h/o bladder stones presents for cystolithalopaxy of multiple bladder cacculi.  Past Medical History:  Diagnosis Date   Arthritis    B12 deficiency    Bladder calculi    CAD (coronary artery disease)    cardiologist--- dr Martinique;   abnormal nuclear stress test 11/ 2013;  s/p cardiac cath 10-21-2012 minimal nonobstructiive CAD w/ normal LVF   Dyslipidemia    Full dentures    Gastroparesis    followed by [name] PA ( [facility] in [place]) s/p gastrectomy for ulcers in 1970s   GERD (gastroesophageal reflux disease)    Heart murmur    Hiatal hernia    History of adenomatous polyp of colon    History of kidney stones    History of melanoma 2008   s/p left forearn wide local excision 06/ 2008   History of syncope 2010   cardiologist --- dr Martinique;  (11-25-2021 per pt no syncope or near syncope since)   Hypertension    Macular degeneration of both eyes    Nocturia associated with benign prostatic hyperplasia    OSA (obstructive sleep apnea)    no cpap   Polyneuropathy    PVC's (premature ventricular contractions) 2010   06/ 2010 per event monitor SR w/ occasional PVCs and 4 beat SVT (in epic)   Wears glasses     Past Surgical History:  Procedure Laterality Date   APPENDECTOMY     child   CARDIAC CATHETERIZATION  10/2012   minimal nonobstructive CAD with normal LV function   CATARACT EXTRACTION W/ INTRAOCULAR LENS IMPLANT Bilateral    2009 approx   CYSTOSCOPY WITH LITHOLAPAXY N/A 07/13/2016   Procedure: CYSTOSCOPY WITH LITHOLAPAXY;  Surgeon: Franchot Gallo, MD;  Location: [facility];  Service: Urology;  Laterality: N/A;   CYSTOSCOPY/RETROGRADE/URETEROSCOPY/STONE EXTRACTION WITH BASKET  02/24/2012   Procedure: CYSTOSCOPY/RETROGRADE/URETEROSCOPY/STONE EXTRACTION WITH BASKET;  Surgeon: Franchot Gallo, MD;  Location: [facility];  Service: Urology;  Laterality: Bilateral;   1 HOUR   EXTRACORPOREAL SHOCK WAVE LITHOTRIPSY  11/16/2011   @[facility]    GASTRECTOMY     1970s  for ulcers   HEMORRHOID SURGERY     yrs ago   [place] Left 07/13/2016   Procedure: REPAIR LEFT INGUINAL HERNIA;  Surgeon: Armandina Gemma, MD;  Location: [facility];  Service: General;  Laterality: Left;   INSERTION OF MESH Left 07/13/2016   Procedure: INSERTION OF MESH;  Surgeon: Armandina Gemma, MD;  Location: [facility];  Service: General;  Laterality: Left;   MELANOMA EXCISION Left 05/05/2007   @[facility]  ;   LEFT FOREARM   TONSILLECTOMY     child   TOTAL HIP ARTHROPLASTY Left 02/11/2021   Procedure: TOTAL HIP ARTHROPLASTY;  Surgeon: Marchia Bond, MD;  Location: [facility];  Service: Orthopedics;  Laterality: Left;   TOTAL SHOULDER ARTHROPLASTY Right 12/29/2017   Procedure: TOTAL SHOULDER ARTHROPLASTY;  Surgeon: Hiram Gash, MD;  Location: [facility];  Service: Orthopedics;  Laterality: Right;    Home Medications:  Allergies as of 11/30/2021       Reactions   Aspirin Other (See Comments)   UPSET STOMACH   Percocet [oxycodone-acetaminophen] Nausea And Vomiting   Vitamins [apatate] Other (See Comments)   Bothers stomach        Medication List      Notice   Cannot display discharge medications because the patient has not yet been admitted.  Allergies:  Allergies  Allergen Reactions   Aspirin Other (See Comments)    UPSET STOMACH   Percocet [Oxycodone-Acetaminophen] Nausea And Vomiting   Vitamins [Apatate] Other (See Comments)    Bothers stomach    Family History  Problem Relation Age of Onset   Heart disease Mother        CABG in her 55s   Pneumonia Father    Cancer Sister        breast   Lung cancer Brother        smoker    Social History:  reports that he quit smoking about 65 years ago. His smoking use included cigarettes. He has a 0.40 pack-year smoking history. He has never used smokeless tobacco. He reports that he does not drink alcohol and does not use  drugs.  ROS: A complete review of systems was performed.  All systems are negative except for pertinent findings as noted.  Physical Exam:  Vital signs in last 24 hours: Ht 6\' 1"  (1.854 m)    Wt 83.9 kg    BMI 24.41 kg/m  Constitutional:  Alert and oriented, No acute distress Cardiovascular: Regular rate  Respiratory: Normal respiratory effort Neurologic: Grossly intact, no focal deficits Psychiatric: Normal mood and affect  I have reviewed prior pt notes  I have reviewed notes from referring/previous physicians  I have reviewed urinalysis results  I have independently reviewed prior imaging    Impression/Assessment:  Multiple bladder calculi  Plan:  Cystolithalopaxy

## 2021-12-01 ENCOUNTER — Encounter (HOSPITAL_BASED_OUTPATIENT_CLINIC_OR_DEPARTMENT_OTHER): Payer: Self-pay | Admitting: Urology

## 2021-12-01 ENCOUNTER — Ambulatory Visit (HOSPITAL_BASED_OUTPATIENT_CLINIC_OR_DEPARTMENT_OTHER): Payer: Medicare Other | Admitting: Anesthesiology

## 2021-12-01 ENCOUNTER — Encounter (HOSPITAL_BASED_OUTPATIENT_CLINIC_OR_DEPARTMENT_OTHER)
Admission: RE | Disposition: A | Discharge status: Home / Self Care | Payer: Self-pay | Source: Home / Self Care | Attending: Urology

## 2021-12-01 ENCOUNTER — Ambulatory Visit (HOSPITAL_BASED_OUTPATIENT_CLINIC_OR_DEPARTMENT_OTHER)
Admission: RE | Admit: 2021-12-01 | Discharge: 2021-12-01 | Disposition: A | Discharge status: Home / Self Care | Payer: Medicare Other | Attending: Urology | Admitting: Urology

## 2021-12-01 ENCOUNTER — Other Ambulatory Visit: Payer: Self-pay

## 2021-12-01 DIAGNOSIS — K219 Gastro-esophageal reflux disease without esophagitis: Secondary | ICD-10-CM | POA: Diagnosis not present

## 2021-12-01 DIAGNOSIS — N210 Calculus in bladder: Secondary | ICD-10-CM | POA: Diagnosis not present

## 2021-12-01 DIAGNOSIS — G4730 Sleep apnea, unspecified: Secondary | ICD-10-CM | POA: Diagnosis not present

## 2021-12-01 DIAGNOSIS — N400 Enlarged prostate without lower urinary tract symptoms: Secondary | ICD-10-CM | POA: Diagnosis not present

## 2021-12-01 DIAGNOSIS — Z87891 Personal history of nicotine dependence: Secondary | ICD-10-CM | POA: Insufficient documentation

## 2021-12-01 DIAGNOSIS — I10 Essential (primary) hypertension: Secondary | ICD-10-CM | POA: Diagnosis not present

## 2021-12-01 HISTORY — DX: Deficiency of other specified B group vitamins: E53.8

## 2021-12-01 HISTORY — PX: HOLMIUM LASER APPLICATION: SHX5852

## 2021-12-01 HISTORY — DX: Presence of spectacles and contact lenses: Z97.3

## 2021-12-01 HISTORY — DX: Presence of dental prosthetic device (complete) (partial): K08.109

## 2021-12-01 HISTORY — DX: Diaphragmatic hernia without obstruction or gangrene: K44.9

## 2021-12-01 HISTORY — DX: Polyneuropathy, unspecified: G62.9

## 2021-12-01 HISTORY — DX: Benign prostatic hyperplasia with lower urinary tract symptoms: N40.1

## 2021-12-01 HISTORY — DX: Personal history of colonic polyps: Z86.010

## 2021-12-01 HISTORY — DX: Unspecified macular degeneration: H35.30

## 2021-12-01 HISTORY — PX: CYSTOSCOPY WITH LITHOLAPAXY: SHX1425

## 2021-12-01 HISTORY — DX: Gastroparesis: K31.84

## 2021-12-01 HISTORY — DX: Personal history of adenomatous and serrated colon polyps: Z86.0101

## 2021-12-01 HISTORY — DX: Complete loss of teeth, unspecified cause, unspecified class: Z97.2

## 2021-12-01 HISTORY — DX: Calculus in bladder: N21.0

## 2021-12-01 LAB — POCT I-STAT, CHEM 8
BUN: 15 mg/dL (ref 8–23)
Calcium, Ion: 1.23 mmol/L (ref 1.15–1.40)
Chloride: 102 mmol/L (ref 98–111)
Creatinine, Ser: 0.7 mg/dL (ref 0.61–1.24)
Glucose, Bld: 119 mg/dL — ABNORMAL HIGH (ref 70–99)
HCT: 48 % (ref 39.0–52.0)
Hemoglobin: 16.3 g/dL (ref 13.0–17.0)
Potassium: 3.5 mmol/L (ref 3.5–5.1)
Sodium: 142 mmol/L (ref 135–145)
TCO2: 26 mmol/L (ref 22–32)

## 2021-12-01 SURGERY — CYSTOSCOPY, WITH BLADDER CALCULUS LITHOLAPAXY
Anesthesia: General | Site: Bladder

## 2021-12-01 MED ORDER — FENTANYL CITRATE (PF) 100 MCG/2ML IJ SOLN
INTRAMUSCULAR | Status: DC | PRN
Start: 2021-12-01 — End: 2021-12-01
  Administered 2021-12-01 (×2): 25 ug via INTRAVENOUS
  Administered 2021-12-01: 50 ug via INTRAVENOUS

## 2021-12-01 MED ORDER — EPHEDRINE 5 MG/ML INJ
INTRAVENOUS | Status: AC
  Filled 2021-12-01: qty 5

## 2021-12-01 MED ORDER — LACTATED RINGERS IV SOLN: INTRAVENOUS | Status: DC

## 2021-12-01 MED ORDER — SODIUM CHLORIDE 0.9 % IR SOLN
Status: DC | PRN
  Administered 2021-12-01: 3000 mL via INTRAVESICAL
  Administered 2021-12-01: 6000 mL via INTRAVESICAL

## 2021-12-01 MED ORDER — CEFAZOLIN SODIUM-DEXTROSE 2-4 GM/100ML-% IV SOLN
INTRAVENOUS | Status: AC
  Filled 2021-12-01: qty 100

## 2021-12-01 MED ORDER — PROPOFOL 10 MG/ML IV BOLUS
INTRAVENOUS | Status: DC | PRN
  Administered 2021-12-01: 120 mg via INTRAVENOUS

## 2021-12-01 MED ORDER — DEXAMETHASONE SODIUM PHOSPHATE 10 MG/ML IJ SOLN
INTRAMUSCULAR | Status: AC
  Filled 2021-12-01: qty 1

## 2021-12-01 MED ORDER — ACETAMINOPHEN 10 MG/ML IV SOLN: 1000.0000 mg | Freq: Once | INTRAVENOUS | Status: DC | PRN

## 2021-12-01 MED ORDER — LIDOCAINE 2% (20 MG/ML) 5 ML SYRINGE
INTRAMUSCULAR | Status: DC | PRN
Start: 2021-12-01 — End: 2021-12-01
  Administered 2021-12-01: 100 mg via INTRAVENOUS

## 2021-12-01 MED ORDER — ONDANSETRON HCL 4 MG/2ML IJ SOLN
INTRAMUSCULAR | Status: DC | PRN
  Administered 2021-12-01: 4 mg via INTRAVENOUS

## 2021-12-01 MED ORDER — DEXAMETHASONE SODIUM PHOSPHATE 10 MG/ML IJ SOLN
INTRAMUSCULAR | Status: DC | PRN
  Administered 2021-12-01: 10 mg via INTRAVENOUS

## 2021-12-01 MED ORDER — PROPOFOL 10 MG/ML IV BOLUS
INTRAVENOUS | Status: AC
  Filled 2021-12-01: qty 20

## 2021-12-01 MED ORDER — FENTANYL CITRATE (PF) 100 MCG/2ML IJ SOLN: 25.0000 ug | INTRAMUSCULAR | Status: DC | PRN

## 2021-12-01 MED ORDER — PHENYLEPHRINE 40 MCG/ML (10ML) SYRINGE FOR IV PUSH (FOR BLOOD PRESSURE SUPPORT)
PREFILLED_SYRINGE | INTRAVENOUS | Status: AC
  Filled 2021-12-01: qty 10

## 2021-12-01 MED ORDER — PHENYLEPHRINE 40 MCG/ML (10ML) SYRINGE FOR IV PUSH (FOR BLOOD PRESSURE SUPPORT)
PREFILLED_SYRINGE | INTRAVENOUS | Status: DC | PRN
  Administered 2021-12-01 (×4): 40 ug via INTRAVENOUS

## 2021-12-01 MED ORDER — FENTANYL CITRATE (PF) 100 MCG/2ML IJ SOLN
INTRAMUSCULAR | Status: AC
  Filled 2021-12-01: qty 2

## 2021-12-01 MED ORDER — 0.9 % SODIUM CHLORIDE (POUR BTL) OPTIME
TOPICAL | Status: DC | PRN
Start: 2021-12-01 — End: 2021-12-01
  Administered 2021-12-01: 500 mL

## 2021-12-01 MED ORDER — ONDANSETRON HCL 4 MG/2ML IJ SOLN
INTRAMUSCULAR | Status: AC
  Filled 2021-12-01: qty 2

## 2021-12-01 MED ORDER — ONDANSETRON HCL 4 MG/2ML IJ SOLN: 4.0000 mg | Freq: Once | INTRAMUSCULAR | Status: DC | PRN

## 2021-12-01 MED ORDER — CEFAZOLIN SODIUM-DEXTROSE 2-4 GM/100ML-% IV SOLN
2.0000 g | INTRAVENOUS | Status: AC
  Administered 2021-12-01: 2 g via INTRAVENOUS

## 2021-12-01 SURGICAL SUPPLY — 19 items
BAG DRAIN URO-CYSTO SKYTR STRL (DRAIN) ×3 IMPLANT
BAG DRN UROCATH (DRAIN) ×1
BAG URINE LEG 500ML (DRAIN) ×1 IMPLANT
CATH FOLEY 2WAY SLVR  5CC 18FR (CATHETERS) ×2
CATH FOLEY 2WAY SLVR 5CC 18FR (CATHETERS) IMPLANT
CLOTH BEACON ORANGE TIMEOUT ST (SAFETY) ×6 IMPLANT
EVACUATOR MICROVAS BLADDER (UROLOGICAL SUPPLIES) ×1 IMPLANT
FIBER LASER FLEXIVA 1000 (UROLOGICAL SUPPLIES) ×1 IMPLANT
FIBER LASER FLEXIVA 200 (UROLOGICAL SUPPLIES) IMPLANT
FIBER LASER FLEXIVA 365 (UROLOGICAL SUPPLIES) IMPLANT
FIBER LASER FLEXIVA 550 (UROLOGICAL SUPPLIES) IMPLANT
GLOVE SURG ENC MOIS LTX SZ8 (GLOVE) ×3 IMPLANT
GOWN STRL REUS W/TWL XL LVL3 (GOWN DISPOSABLE) ×3 IMPLANT
IV NS IRRIG 3000ML ARTHROMATIC (IV SOLUTION) ×2 IMPLANT
KIT TURNOVER CYSTO (KITS) ×3 IMPLANT
MANIFOLD NEPTUNE II (INSTRUMENTS) ×3 IMPLANT
PACK CYSTO (CUSTOM PROCEDURE TRAY) ×3 IMPLANT
TUBE CONNECTING 12X1/4 (SUCTIONS) ×3 IMPLANT
WATER STERILE IRR 500ML POUR (IV SOLUTION) ×2 IMPLANT

## 2021-12-01 NOTE — Discharge Instructions (Addendum)
You may see some blood in the urine and may have some burning with urination for 48-72 hours. You also may notice that you have to urinate more frequently or urgently after your procedure which is normal.  You should call should you develop an inability urinate, fever > 101, persistent nausea and vomiting that prevents you from eating or drinking to stay hydrated.   If you have a catheter, you will be taught how to take care of the catheter by the nursing staff prior to discharge from the hospital.  You may periodically feel a strong urge to void with the catheter in place.  This is a bladder spasm and most often can occur when having a bowel movement or moving around. It is typically self-limited and usually will stop after a few minutes.  You may use some Vaseline or Neosporin around the tip of the catheter to reduce friction at the tip of the penis. You may also see some blood in the urine.  A very small amount of blood can make the urine look quite red.  As long as the catheter is draining well, there usually is not a problem.  However, if the catheter is not draining well and is bloody, you should call the office [phone] to notify us.  It is okay to remove the catheter on Tuesday morning as instructed if your urine is clear   Post Anesthesia Home Care Instructions  Activity: Get plenty of rest for the remainder of the day. A responsible individual must stay with you for 24 hours following the procedure.  For the next 24 hours, DO NOT: -Drive a car -Paediatric nurse -Drink alcoholic beverages -Take any medication unless instructed by your physician -Make any legal decisions or sign important papers.  Meals: Start with liquid foods such as gelatin or soup. Progress to regular foods as tolerated. Avoid greasy, spicy, heavy foods. If nausea and/or vomiting occur, drink only clear liquids until the nausea and/or vomiting subsides. Call your physician if vomiting continues.  Special  Instructions/Symptoms: Your throat may feel dry or sore from the anesthesia or the breathing tube placed in your throat during surgery. If this causes discomfort, gargle with warm salt water. The discomfort should disappear within 24 hours.

## 2021-12-01 NOTE — Anesthesia Preprocedure Evaluation (Signed)
Anesthesia Evaluation  Patient identified by MRN, date of birth, ID band Patient awake    Reviewed: Allergy & Precautions, NPO status , Patient's Chart, lab work & pertinent test results  Airway Mallampati: II  TM Distance: >3 FB Neck ROM: Full    Dental no notable dental hx.    Pulmonary sleep apnea and Continuous Positive Airway Pressure Ventilation , former smoker,    Pulmonary exam normal breath sounds clear to auscultation       Cardiovascular hypertension, Pt. on medications Normal cardiovascular exam Rhythm:Regular Rate:Normal     Neuro/Psych negative neurological ROS  negative psych ROS   GI/Hepatic Neg liver ROS, GERD  Medicated,  Endo/Other  negative endocrine ROS  Renal/GU negative Renal ROS  negative genitourinary   Musculoskeletal negative musculoskeletal ROS (+)   Abdominal   Peds negative pediatric ROS (+)  Hematology negative hematology ROS (+)   Anesthesia Other Findings   Reproductive/Obstetrics negative OB ROS                             Anesthesia Physical Anesthesia Plan  ASA: 2  Anesthesia Plan: General   Post-op Pain Management: Minimal or no pain anticipated   Induction: Intravenous  PONV Risk Score and Plan: 2 and Ondansetron, Dexamethasone and Treatment may vary due to age or medical condition  Airway Management Planned: LMA  Additional Equipment:   Intra-op Plan:   Post-operative Plan: Extubation in OR  Informed Consent: I have reviewed the patients History and Physical, chart, labs and discussed the procedure including the risks, benefits and alternatives for the proposed anesthesia with the patient or authorized representative who has indicated his/her understanding and acceptance.     Dental advisory given  Plan Discussed with: CRNA and Surgeon  Anesthesia Plan Comments:         Anesthesia Quick Evaluation

## 2021-12-01 NOTE — Transfer of Care (Signed)
Immediate Anesthesia Transfer of Care Note  Patient: Joseph Simpson  Procedure(s) Performed: CYSTOSCOPY WITH LITHOLAPAXY (Bladder) HOLMIUM LASER APPLICATION (Bladder)  Patient Location: PACU  Anesthesia Type:General  Level of Consciousness: awake, alert  and oriented  Airway & Oxygen Therapy: Patient Spontanous Breathing and Patient connected to face mask oxygen  Post-op Assessment: Report given to RN and Post -op Vital signs reviewed and stable  Post vital signs: Reviewed and stable  Last Vitals:  Vitals Value Taken Time  BP 159/94 12/01/21 1032  Temp 36.4 C 12/01/21 1028  Pulse 97 12/01/21 1038  Resp 14 12/01/21 1038  SpO2 94 % 12/01/21 1038  Vitals shown include unvalidated device data.  Last Pain:  Vitals:   12/01/21 1028  TempSrc:   PainSc: 0-No pain         Complications: No notable events documented.

## 2021-12-01 NOTE — Anesthesia Postprocedure Evaluation (Signed)
Anesthesia Post Note  Patient: Joseph Simpson  Procedure(s) Performed: CYSTOSCOPY WITH LITHOLAPAXY (Bladder) HOLMIUM LASER APPLICATION (Bladder)     Patient location during evaluation: PACU Anesthesia Type: General Level of consciousness: awake and alert Pain management: pain level controlled Vital Signs Assessment: post-procedure vital signs reviewed and stable Respiratory status: spontaneous breathing, nonlabored ventilation, respiratory function stable and patient connected to nasal cannula oxygen Cardiovascular status: blood pressure returned to baseline and stable Postop Assessment: no apparent nausea or vomiting Anesthetic complications: no   No notable events documented.  Last Vitals:  Vitals:   12/01/21 1100 12/01/21 1130  BP: (!) 153/93 (!) 154/97  Pulse: 91 (!) 104  Resp: 14 16  Temp: (!) 36.3 C 36.5 C  SpO2: 94% 97%    Last Pain:  Vitals:   12/01/21 1130  TempSrc: Oral  PainSc: 0-No pain                 Veleda Mun S

## 2021-12-01 NOTE — Anesthesia Procedure Notes (Signed)
Procedure Name: LMA Insertion Date/Time: 12/01/2021 9:28 AM Performed by: Hewitt Blade, CRNA Pre-anesthesia Checklist: Patient identified, Emergency Drugs available, Suction available and Patient being monitored Patient Re-evaluated:Patient Re-evaluated prior to induction Oxygen Delivery Method: Circle system utilized Preoxygenation: Pre-oxygenation with 100% oxygen Induction Type: IV induction Ventilation: Mask ventilation without difficulty LMA: LMA inserted LMA Size: 5.0 Number of attempts: 1 Placement Confirmation: positive ETCO2 and breath sounds checked- equal and bilateral Tube secured with: Tape Dental Injury: Teeth and Oropharynx as per pre-operative assessment

## 2021-12-01 NOTE — Op Note (Signed)
Preoperative diagnosis: Multiple bladder calculi, largest approximately 13 mm  Postoperative diagnosis: Same  Principal procedure: Cystoscopy, cystolitholapaxy, largest stone 13 mm  Surgeon: Jaquasha Carnevale  Anesthesia: General with LMA  Complications: None  Drains: None  Specimen: Multiple stones  Estimated blood loss: Less than 5 mL  Indications: 87 year old male with BPH and recurrent bladder calculi.  He has a history of prior cystolitholapaxy.  Recent follow-up revealed microscopic hematuria.  Bladder ultrasound and resultant cystoscopy revealed multiple bladder calculi.  He presents at this time for cystolitholapaxy.  The patient is aware of the procedure, risks, complications and expected outcomes.  I discussed this with him, he understands and desires to proceed.  Findings: Urethra was normal.  There was trilobar hypertrophy with a fairly large intravesical median lobe.  Multiple trabeculations noted.  Also, multiple stones, possibly about 40 these were present layering posteriorly on the bladder.  There were several cellules.  No urothelial lesions were noted  Ureteral orifices were normal in their location and configuration.  Description of procedure: The patient was properly identified in the holding area.  He was taken to the operating room where general anesthetic was administered with the LMA.  He is placed in the dorsolithotomy position.  Genitalia and perineum were prepped, draped, proper timeout performed.  Urethral meatus dilated to 32 [place] with [employer] sounds.  Following this, 18 French laser sheath was passed using the visual obturator.  Circumferential inspection was then performed of the bladder with the stone seen.  Most of these were able to be drained through the sheath.  However, there were several large stones that would not pass through the sheath.  These were then fragmented with the holmium laser at a setting of 1.8 J and 30 Hz.  1000 m fiber was utilized.  The  stones were fragmented small enough that they easily passed through the scope at this point.  Meticulous irrigation and drainage of the small fragments was then performed.  Eventually, careful inspection revealed no significant stone burden.  Care was taken to place traction on the median lobe inferiorly to make sure that all stones were gone.  At this point, cystolitholapaxy was complete.  The scope was removed.  I then passed an 15 French Foley catheter.  Balloon filled with 10 cc of water.  Hooked to dependent drainage.  At this point the procedure was terminated.  The patient was awakened, taken to the PACU in stable condition having tolerated procedure well

## 2021-12-01 NOTE — Interval H&P Note (Signed)
History and Physical Interval Note:  12/01/2021 9:06 AM  Joseph Simpson  has presented today for surgery, with the diagnosis of BLADDER STONES.  The various methods of treatment have been discussed with the patient and family. After consideration of risks, benefits and other options for treatment, the patient has consented to  Procedure(s): CYSTOSCOPY WITH LITHOLAPAXY (N/A) HOLMIUM LASER APPLICATION (N/A) as a surgical intervention.  The patient's history has been reviewed, patient examined, no change in status, stable for surgery.  I have reviewed the patient's chart and labs.  Questions were answered to the patient's satisfaction.     [name]

## 2021-12-02 ENCOUNTER — Ambulatory Visit: Payer: Medicare Other | Admitting: Family Medicine

## 2021-12-03 ENCOUNTER — Encounter (HOSPITAL_COMMUNITY): Payer: Self-pay

## 2021-12-03 ENCOUNTER — Other Ambulatory Visit: Payer: Self-pay

## 2021-12-03 ENCOUNTER — Emergency Department (HOSPITAL_COMMUNITY)
Admission: EM | Admit: 2021-12-03 | Discharge: 2021-12-03 | Disposition: A | Discharge status: Home / Self Care | Payer: Medicare Other | Attending: Emergency Medicine | Admitting: Emergency Medicine

## 2021-12-03 DIAGNOSIS — Z79899 Other long term (current) drug therapy: Secondary | ICD-10-CM | POA: Diagnosis not present

## 2021-12-03 DIAGNOSIS — R339 Retention of urine, unspecified: Secondary | ICD-10-CM | POA: Insufficient documentation

## 2021-12-03 DIAGNOSIS — R1900 Intra-abdominal and pelvic swelling, mass and lump, unspecified site: Secondary | ICD-10-CM | POA: Diagnosis not present

## 2021-12-03 NOTE — ED Triage Notes (Signed)
Pt states that he had a procedure on Monday for his kidney stones, the foley was removed Tuesday, pt has not urinated since Tuesday morning.

## 2021-12-03 NOTE — Discharge Instructions (Addendum)
Call your urologist for an appointment to be seen early next week for possible removal of the urinary catheter

## 2021-12-03 NOTE — ED Provider Notes (Signed)
[facility] DEPT Provider Note   CSN: [id number] Arrival date & time: 12/03/21  0617     History  Chief Complaint  Patient presents with   Urinary Retention    Joseph Simpson is a 87 y.o. male.  HPI He had a cystoscopy on 12/01/2020 to remove urinary bladder stones.  He had a catheter placed following that which he removed yesterday morning.  He was unable to void after taking the catheter out.  He states that prior to removal of the Foley catheter, urine was bloody.  He denies fever, vomiting, weakness or dizziness.  He came in by private vehicle.    Home Medications Prior to Admission medications   Medication Sig Start Date End Date Taking? Authorizing Provider  acetaminophen (TYLENOL) 500 MG tablet Take 500 mg by mouth 2 (two) times daily as needed for moderate pain or headache.    [provider]  atorvastatin (LIPITOR) 20 MG tablet TAKE ONE TABLET BY MOUTH THREE TIMES A WEEK 09/18/21   Marin Olp, MD  bevacizumab (AVASTIN) 2.5 mg/0.1 mL SOLN 2.5 mg by Intravitreal route. Injection to eyes every 5 weeks - use with vigamox    [provider]  Carboxymeth-Glycerin-Polysorb (REFRESH OPTIVE ADVANCED OP) Place 1 drop into both eyes daily.    [provider]  Carboxymethylcellulose Sodium 1 % GEL Place 1 drop into both eyes at bedtime.    [provider]  Cyanocobalamin (B-12 COMPLIANCE INJECTION IJ) Inject 1 Dose as directed every 30 (thirty) days.    [provider]  losartan (COZAAR) 100 MG tablet Take one-half tablet by mouth twice daily Patient taking differently: Take 50 mg by mouth 2 (two) times daily. Take one-half tablet by mouth twice daily 06/27/21   Marin Olp, MD  metoCLOPramide (REGLAN) 5 MG tablet Take 5 mg by mouth 2 (two) times daily.    [provider]  metoprolol tartrate (LOPRESSOR) 25 MG tablet Take 0.5 tablets (12.5 mg total) by mouth 2 (two) times daily. 06/27/21    Marin Olp, MD  pantoprazole (PROTONIX) 40 MG tablet Take 40 mg by mouth 2 (two) times daily.    [provider]  polyethylene glycol (MIRALAX / GLYCOLAX) 17 g packet Take 17 g by mouth daily as needed for moderate constipation.    [provider]  sennosides-docusate sodium (SENOKOT-S) 8.6-50 MG tablet Take 2 tablets by mouth daily. Patient taking differently: Take 2 tablets by mouth as needed. 02/12/21   Merlene Pulling K, PA-C  VIGAMOX 0.5 % ophthalmic solution Place 1 drop into both eyes as directed. Take every 5 weeks as directed - use with avastin, instill 1 drop into both eyes 4 times daily for 2 days after eye injections 05/28/14   [provider]      Allergies    Aspirin, Percocet [oxycodone-acetaminophen], and Vitamins [apatate]    Review of Systems   Review of Systems  All other systems reviewed and are negative.  Physical Exam Updated Vital Signs BP (!) 143/71    Pulse (!) 56    Temp 97.8 F (36.6 C) (Oral)    Resp 18    Ht 6' (1.829 m)    Wt 78.9 kg    SpO2 96%    BMI 23.60 kg/m  Physical Exam Vitals and nursing note reviewed.  Constitutional:      Appearance: He is well-developed. He is not ill-appearing.  HENT:     Head: Normocephalic and atraumatic.  Right Ear: External ear normal.     Left Ear: External ear normal.  Eyes:     Conjunctiva/sclera: Conjunctivae normal.     Pupils: Pupils are equal, round, and reactive to light.  Neck:     Trachea: Phonation normal.  Cardiovascular:     Rate and Rhythm: Normal rate.  Pulmonary:     Effort: Pulmonary effort is normal.  Abdominal:     Palpations: Abdomen is soft. There is mass (Consistent with palpable urinary bladder).     Tenderness: There is no abdominal tenderness.  Musculoskeletal:        General: Normal range of motion.     Cervical back: Normal range of motion and neck supple.  Skin:    General: Skin is warm and dry.  Neurological:     Mental Status: He is alert and  oriented to person, place, and time.     Cranial Nerves: No cranial nerve deficit.     Sensory: No sensory deficit.     Motor: No abnormal muscle tone.     Coordination: Coordination normal.  Psychiatric:        Mood and Affect: Mood normal.        Behavior: Behavior normal.        Thought Content: Thought content normal.        Judgment: Judgment normal.    ED Results / Procedures / Treatments   Labs (all labs ordered are listed, but only abnormal results are displayed) Labs Reviewed - No data to display  EKG None  Radiology No results found.  Procedures Procedures    Medications Ordered in ED Medications - No data to display  ED Course/ Medical Decision Making/ A&P Clinical Course as of 12/03/21 1013  Wed Dec 03, 2021  7353 Bladder scan with greater than 500 cc of urine in the urinary bladder.  Foley catheter ordered. [EW]    Clinical Course User Index [EW] Daleen Bo, MD                           Medical Decision Making Presenting to the ED for inability to void after removing his catheter yesterday.  Urologic procedure, urinary bladder stone removal, 2 days ago.  Problems Addressed: Urinary retention: acute illness or injury    Details: Urinary retention, following cystoscopy and anesthesia.  Amount and/or Complexity of Data Reviewed Independent Historian: caregiver    Details: Granddaughter at bedside  Risk Decision regarding hospitalization. Risk Details: Patient's urinary retention, controlled with Foley catheter placement.  Urine draining well does not require irrigation, instrumentation or hospitalization at this time.  Recommend outpatient follow-up with urology in 1 week, sooner if needed for complications.           Final Clinical Impression(s) / ED Diagnoses Final diagnoses:  Urinary retention    Rx / DC Orders ED Discharge Orders     None         Daleen Bo, MD 12/03/21 1014

## 2021-12-03 NOTE — ED Notes (Signed)
Pt states understanding of dc instructions, importance of follow up, and foley catheter instructions discussed and demonstrated. Leg bag placed on pt and pt sent home w/ drainage bag.  Pt and daughter deny questions or concerns upon dc. Pt declined wheelchair assistance upon dc. Pt ambulated out of ed w/ steady gait. No belongings left in room upon dc.

## 2021-12-09 ENCOUNTER — Ambulatory Visit (INDEPENDENT_AMBULATORY_CARE_PROVIDER_SITE_OTHER): Payer: Medicare Other | Admitting: *Deleted

## 2021-12-09 ENCOUNTER — Other Ambulatory Visit: Payer: Self-pay

## 2021-12-09 DIAGNOSIS — E538 Deficiency of other specified B group vitamins: Secondary | ICD-10-CM | POA: Diagnosis not present

## 2021-12-09 MED ORDER — CYANOCOBALAMIN 1000 MCG/ML IJ SOLN
1000.0000 ug | Freq: Once | INTRAMUSCULAR | Status: AC
  Administered 2021-12-09: 10:00:00 1000 ug via INTRAMUSCULAR

## 2021-12-09 NOTE — Progress Notes (Signed)
I have reviewed and agree with note, evaluation, plan.   Zarius Furr, MD  

## 2021-12-09 NOTE — Progress Notes (Signed)
Patient came into the office for B 12 injection per orders of Dr. Yong Channel. Injection given in right deltoid per patient preference. Patient tolerated well.  Joseph Simpson, CMA

## 2021-12-11 ENCOUNTER — Ambulatory Visit: Payer: Medicare Other | Admitting: Family Medicine

## 2021-12-11 DIAGNOSIS — N210 Calculus in bladder: Secondary | ICD-10-CM | POA: Diagnosis not present

## 2021-12-11 DIAGNOSIS — R338 Other retention of urine: Secondary | ICD-10-CM | POA: Diagnosis not present

## 2021-12-15 ENCOUNTER — Other Ambulatory Visit: Payer: Self-pay

## 2021-12-15 ENCOUNTER — Encounter (INDEPENDENT_AMBULATORY_CARE_PROVIDER_SITE_OTHER): Payer: Medicare Other | Admitting: Ophthalmology

## 2021-12-15 DIAGNOSIS — H353231 Exudative age-related macular degeneration, bilateral, with active choroidal neovascularization: Secondary | ICD-10-CM

## 2021-12-15 DIAGNOSIS — H35033 Hypertensive retinopathy, bilateral: Secondary | ICD-10-CM | POA: Diagnosis not present

## 2021-12-15 DIAGNOSIS — H33301 Unspecified retinal break, right eye: Secondary | ICD-10-CM

## 2021-12-15 DIAGNOSIS — D3132 Benign neoplasm of left choroid: Secondary | ICD-10-CM

## 2021-12-15 DIAGNOSIS — I10 Essential (primary) hypertension: Secondary | ICD-10-CM

## 2021-12-15 DIAGNOSIS — H43813 Vitreous degeneration, bilateral: Secondary | ICD-10-CM

## 2021-12-17 NOTE — Progress Notes (Signed)
Joseph Simpson [address] [place] Phone: [phone] Subjective:   IVilma Meckel, am serving as a scribe for Dr. Hulan Saas. This visit occurred during the SARS-CoV-2 public health emergency.  Safety protocols were in place, including screening questions prior to the visit, additional usage of staff PPE, and extensive cleaning of exam room while observing appropriate contact time as indicated for disinfecting solutions.   I'm seeing this patient by the request  of:  Marin Olp, MD  CC: leg pain follow up   LKG:[id number]  10/01/2021 Seems to be underlying arthritic changes.  Given a wrist brace to wear at night.  We will discussed home exercises that I think will be beneficial.  Would normally get x-rays but unfortunately x-ray is down today and patient does not want to come back.  Encourage patient that we need to consider doing this in the near future to further evaluate the arthritis.  Follow-up with me again 6 to 8 weeks after the conservative therapy.  Multifactorial with the breakdown of the foot but also the B12 deficiency and the peripheral neuropathy.  Responding well to the B12 injections as well as continuing the different over-the-counter orthotics and recovery sandals.  No significant changes in management with patient doing well and follow-up again in 3 months  Updated 12/23/2021 Joseph Simpson is a 87 y.o. male coming in with complaint of foot and leg pain. Doing better and doing the home exercises. Couldn't wear brace because it hurt thumb. Wrist is doing well. No other complaints. No new complaints.       Past Medical History:  Diagnosis Date   Arthritis    B12 deficiency    Bladder calculi    CAD (coronary artery disease)    cardiologist--- dr Martinique;   abnormal nuclear stress test 11/ 2013;  s/p cardiac cath 10-21-2012 minimal nonobstructiive CAD w/ normal LVF   Dyslipidemia    Full dentures    Gastroparesis     followed by [name] PA ( [facility] in [place]) s/p gastrectomy for ulcers in 1970s   GERD (gastroesophageal reflux disease)    Heart murmur    Hiatal hernia    History of adenomatous polyp of colon    History of kidney stones    History of melanoma 2008   s/p left forearn wide local excision 06/ 2008   History of syncope 2010   cardiologist --- dr Martinique;  (11-25-2021 per pt no syncope or near syncope since)   Hypertension    Macular degeneration of both eyes    Nocturia associated with benign prostatic hyperplasia    OSA (obstructive sleep apnea)    no cpap   Polyneuropathy    PVC's (premature ventricular contractions) 2010   06/ 2010 per event monitor SR w/ occasional PVCs and 4 beat SVT (in epic)   Wears glasses    Past Surgical History:  Procedure Laterality Date   APPENDECTOMY     child   CARDIAC CATHETERIZATION  10/2012   minimal nonobstructive CAD with normal LV function   CATARACT EXTRACTION W/ INTRAOCULAR LENS IMPLANT Bilateral    2009 approx   CYSTOSCOPY WITH LITHOLAPAXY N/A 07/13/2016   Procedure: CYSTOSCOPY WITH LITHOLAPAXY;  Surgeon: Franchot Gallo, MD;  Location: [facility];  Service: Urology;  Laterality: N/A;   CYSTOSCOPY WITH LITHOLAPAXY N/A 12/01/2021   Procedure: CYSTOSCOPY WITH LITHOLAPAXY;  Surgeon: Franchot Gallo, MD;  Location: [facility];  Service: Urology;  Laterality: N/A;   CYSTOSCOPY/RETROGRADE/URETEROSCOPY/STONE EXTRACTION WITH BASKET  02/24/2012   Procedure: CYSTOSCOPY/RETROGRADE/URETEROSCOPY/STONE EXTRACTION WITH BASKET;  Surgeon: Franchot Gallo, MD;  Location: [facility];  Service: Urology;  Laterality: Bilateral;  1 HOUR   EXTRACORPOREAL SHOCK WAVE LITHOTRIPSY  11/16/2011   @[facility]    GASTRECTOMY     1970s  for ulcers   HEMORRHOID SURGERY     yrs ago   HOLMIUM LASER APPLICATION N/A 1/61/0960   Procedure: HOLMIUM LASER APPLICATION;  Surgeon: Franchot Gallo, MD;  Location: [facility];  Service: Urology;  Laterality: N/A;   INGUINAL HERNIA REPAIR Left 07/13/2016   Procedure: REPAIR LEFT INGUINAL HERNIA;  Surgeon: Armandina Gemma, MD;  Location: [facility];  Service: General;  Laterality: Left;   INSERTION OF MESH Left 07/13/2016   Procedure: INSERTION OF MESH;  Surgeon: Armandina Gemma, MD;  Location: [facility];  Service: General;  Laterality: Left;   MELANOMA EXCISION Left 05/05/2007   @[facility]  ;   LEFT FOREARM   TONSILLECTOMY     child   TOTAL HIP ARTHROPLASTY Left 02/11/2021   Procedure: TOTAL HIP ARTHROPLASTY;  Surgeon: Marchia Bond, MD;  Location: [facility];  Service: Orthopedics;  Laterality: Left;   TOTAL SHOULDER ARTHROPLASTY Right 12/29/2017   Procedure: TOTAL SHOULDER ARTHROPLASTY;  Surgeon: Hiram Gash, MD;  Location: [place];  Service: Orthopedics;  Laterality: Right;   Social History   Socioeconomic History   Marital status: Widowed    Spouse name: Not on file   Number of children: 1   Years of education: Not on file   Highest education level: Not on file  Occupational History   Occupation: retired  Tobacco Use   Smoking status: Former    Packs/day: 0.10    Years: 4.00    Pack years: 0.40    Types: Cigarettes    Quit date: 09/03/1956    Years since quitting: 65.3   Smokeless tobacco: Never  Vaping Use   Vaping Use: Never used  Substance and Sexual Activity   Alcohol use: No    Alcohol/week: 0.0 standard drinks   Drug use: Never   Sexual activity: Yes  Other Topics Concern   Not on file  Social History Narrative   Widowed (lost wife 2015 from lung cancer never smoker), 1 son, 2 grandkids, 1 greatgrandkid (born on Christmas)      Retired from [employer] for 40 years. 3 million miles.       Hobbies: ride motorcycle (slingshot 3 year), rabbit and dear hunt   Social Determinants of Radio broadcast assistant Strain: Low Risk    Difficulty of Paying Living Expenses: Not hard at all  Food Insecurity: No Food Insecurity   Worried About Sales executive in the Last Year: Never true   Arboriculturist in the Last Year: Never true  Transportation Needs: No Transportation Needs   Lack of Transportation (Medical): No   Lack of Transportation (Non-Medical): No  Physical Activity: Sufficiently Active   Days of Exercise per Week: 5 days   Minutes of Exercise per Session: 30 min  Stress: No Stress Concern Present   Feeling of Stress : Not at all  Social Connections: Moderately Isolated   Frequency of Communication with Friends and Family: More than three times a week   Frequency of Social Gatherings with Friends and Family: More than three times a week   Attends Religious Services: More than 4 times per year   Active Member  of Clubs or Organizations: No   Attends Archivist Meetings: Never   Marital Status: Widowed   Allergies  Allergen Reactions   Aspirin Other (See Comments)    UPSET STOMACH   Percocet [Oxycodone-Acetaminophen] Nausea And Vomiting   Vitamins [Apatate] Other (See Comments)    Bothers stomach   Family History  Problem Relation Age of Onset   Heart disease Mother        CABG in her 58s   Pneumonia Father    Cancer Sister        breast   Lung cancer Brother        smoker     Current Outpatient Medications (Cardiovascular):    atorvastatin (LIPITOR) 20 MG tablet, TAKE ONE TABLET BY MOUTH THREE TIMES A WEEK   losartan (COZAAR) 100 MG tablet, Take one-half tablet by mouth twice daily   metoprolol tartrate (LOPRESSOR) 25 MG tablet, Take 0.5 tablets (12.5 mg total) by mouth 2 (two) times daily.   Current Outpatient Medications (Analgesics):    acetaminophen (TYLENOL) 500 MG tablet, Take 500 mg by mouth 2 (two) times daily as needed for moderate pain or headache.  Current Outpatient Medications (Hematological):    Cyanocobalamin (B-12 COMPLIANCE INJECTION IJ), Inject 1 Dose as directed every 30 (thirty) days.  Current Outpatient Medications (Other):    bevacizumab (AVASTIN) 2.5 mg/0.1 mL SOLN, 2.5 mg  by Intravitreal route. Injection to eyes every 5 weeks - use with vigamox   Carboxymeth-Glycerin-Polysorb (REFRESH OPTIVE ADVANCED OP), Place 1 drop into both eyes daily.   Carboxymethylcellulose Sodium 1 % GEL, Place 1 drop into both eyes at bedtime.   metoCLOPramide (REGLAN) 5 MG tablet, Take 5 mg by mouth 2 (two) times daily.   pantoprazole (PROTONIX) 40 MG tablet, Take 40 mg by mouth 2 (two) times daily.   polyethylene glycol (MIRALAX / GLYCOLAX) 17 g packet, Take 17 g by mouth daily as needed for moderate constipation.   sennosides-docusate sodium (SENOKOT-S) 8.6-50 MG tablet, Take 2 tablets by mouth daily. (Patient taking differently: Take 2 tablets by mouth as needed.)   VIGAMOX 0.5 % ophthalmic solution, Place 1 drop into both eyes as directed. Take every 5 weeks as directed - use with avastin, instill 1 drop into both eyes 4 times daily for 2 days after eye injections     Objective  Blood pressure 130/86, pulse 72, height 6' (1.829 m), weight 171 lb (77.6 kg), SpO2 97 %.   General: No apparent distress alert and oriented x3 mood and affect normal, dressed appropriately.  HEENT: Pupils equal, extraocular movements intact  Respiratory: Patient's speak in full sentences and does not appear short of breath  Cardiovascular: trace  lower extremity edema, non tender, no erythema  Gait antalgic  Arthritic changes noted multiple joints, no pain though on exam today    Impression and Recommendations:     The above documentation has been reviewed and is accurate and complete Lyndal Pulley, DO

## 2021-12-18 ENCOUNTER — Telehealth: Payer: Self-pay

## 2021-12-18 ENCOUNTER — Telehealth: Payer: Self-pay | Admitting: Cardiology

## 2021-12-18 MED ORDER — LOSARTAN POTASSIUM 100 MG PO TABS: ORAL_TABLET | ORAL | 3 refills | Status: DC

## 2021-12-18 MED ORDER — METOPROLOL TARTRATE 25 MG PO TABS: 12.5000 mg | ORAL_TABLET | Freq: Two times a day (BID) | ORAL | 3 refills | Status: DC

## 2021-12-18 MED ORDER — ATORVASTATIN CALCIUM 20 MG PO TABS: ORAL_TABLET | ORAL | 3 refills | Status: DC

## 2021-12-18 NOTE — Telephone Encounter (Signed)
Refills sent to requested pharmacy, pharmacy updated.

## 2021-12-18 NOTE — Telephone Encounter (Signed)
Called patient Joseph Simpson and asked for him to verify his new pharmacy and the medications he is needed to be sent. He stated that [facility] will be [facility] on [address] and that he take metoprolol tartrate 12.5 mg 2 times a day. Informed him that I will [facility] the [facility] to [facility] and that his pharmacy will contact him when Rx is ready for pick up. He verbalized understanding and all (if any) questions were answered.

## 2021-12-18 NOTE — Telephone Encounter (Signed)
°*  STAT* If patient is at the pharmacy, call can be transferred to refill team.   1. Which medications need to be refilled? (please list name of each medication and dose if known) changing pharmacy, need a new prescription for Metoprolol  2. Which pharmacy/location (including street and city if local pharmacy) is medication to be sent to? [facility] [address] [place]  3. Do they need a 30 day or 90 day supply? 90 days and refills

## 2021-12-18 NOTE — Telephone Encounter (Signed)
Patient is requesting pharmacy to be [facility] [address].  Please remove all other pharmacies.  Requesting refills to be sent for:  Atorvastatin and losartan [facility]   Last OV:  07/30/21 Next OV:  NA

## 2021-12-23 ENCOUNTER — Other Ambulatory Visit: Payer: Self-pay

## 2021-12-23 ENCOUNTER — Encounter: Payer: Self-pay | Admitting: Family Medicine

## 2021-12-23 ENCOUNTER — Ambulatory Visit (INDEPENDENT_AMBULATORY_CARE_PROVIDER_SITE_OTHER): Payer: Medicare Other | Admitting: Family Medicine

## 2021-12-23 DIAGNOSIS — M79671 Pain in right foot: Secondary | ICD-10-CM | POA: Diagnosis not present

## 2021-12-23 DIAGNOSIS — M79672 Pain in left foot: Secondary | ICD-10-CM | POA: Diagnosis not present

## 2021-12-23 NOTE — Assessment & Plan Note (Addendum)
Significant improvement, seems to be doing well, responding well to OTC ortho, B12  F/u prn

## 2021-12-23 NOTE — Patient Instructions (Signed)
Looks great to me Glad you got all those stones out of there See me when you need me

## 2021-12-30 DIAGNOSIS — R300 Dysuria: Secondary | ICD-10-CM | POA: Diagnosis not present

## 2021-12-30 DIAGNOSIS — R3911 Hesitancy of micturition: Secondary | ICD-10-CM | POA: Diagnosis not present

## 2021-12-30 DIAGNOSIS — R350 Frequency of micturition: Secondary | ICD-10-CM | POA: Diagnosis not present

## 2021-12-30 DIAGNOSIS — N3000 Acute cystitis without hematuria: Secondary | ICD-10-CM | POA: Diagnosis not present

## 2021-12-30 DIAGNOSIS — N401 Enlarged prostate with lower urinary tract symptoms: Secondary | ICD-10-CM | POA: Diagnosis not present

## 2022-01-01 ENCOUNTER — Emergency Department (HOSPITAL_COMMUNITY)
Admission: EM | Admit: 2022-01-01 | Discharge: 2022-01-01 | Disposition: A | Discharge status: Home / Self Care | Payer: Medicare Other | Source: Home / Self Care | Attending: Emergency Medicine | Admitting: Emergency Medicine

## 2022-01-01 ENCOUNTER — Emergency Department (HOSPITAL_COMMUNITY): Payer: Medicare Other

## 2022-01-01 DIAGNOSIS — R1111 Vomiting without nausea: Secondary | ICD-10-CM | POA: Diagnosis not present

## 2022-01-01 DIAGNOSIS — K3189 Other diseases of stomach and duodenum: Secondary | ICD-10-CM | POA: Diagnosis not present

## 2022-01-01 DIAGNOSIS — R0902 Hypoxemia: Secondary | ICD-10-CM | POA: Diagnosis not present

## 2022-01-01 DIAGNOSIS — K209 Esophagitis, unspecified without bleeding: Secondary | ICD-10-CM | POA: Diagnosis not present

## 2022-01-01 DIAGNOSIS — K2971 Gastritis, unspecified, with bleeding: Secondary | ICD-10-CM | POA: Diagnosis present

## 2022-01-01 DIAGNOSIS — K5900 Constipation, unspecified: Secondary | ICD-10-CM | POA: Insufficient documentation

## 2022-01-01 DIAGNOSIS — N390 Urinary tract infection, site not specified: Secondary | ICD-10-CM | POA: Diagnosis not present

## 2022-01-01 DIAGNOSIS — I2510 Atherosclerotic heart disease of native coronary artery without angina pectoris: Secondary | ICD-10-CM | POA: Diagnosis present

## 2022-01-01 DIAGNOSIS — K921 Melena: Secondary | ICD-10-CM | POA: Diagnosis not present

## 2022-01-01 DIAGNOSIS — E440 Moderate protein-calorie malnutrition: Secondary | ICD-10-CM | POA: Diagnosis not present

## 2022-01-01 DIAGNOSIS — E785 Hyperlipidemia, unspecified: Secondary | ICD-10-CM | POA: Diagnosis present

## 2022-01-01 DIAGNOSIS — K254 Chronic or unspecified gastric ulcer with hemorrhage: Secondary | ICD-10-CM | POA: Diagnosis present

## 2022-01-01 DIAGNOSIS — N200 Calculus of kidney: Secondary | ICD-10-CM | POA: Diagnosis not present

## 2022-01-01 DIAGNOSIS — Z96642 Presence of left artificial hip joint: Secondary | ICD-10-CM | POA: Diagnosis present

## 2022-01-01 DIAGNOSIS — K625 Hemorrhage of anus and rectum: Secondary | ICD-10-CM | POA: Insufficient documentation

## 2022-01-01 DIAGNOSIS — K922 Gastrointestinal hemorrhage, unspecified: Secondary | ICD-10-CM | POA: Diagnosis not present

## 2022-01-01 DIAGNOSIS — N400 Enlarged prostate without lower urinary tract symptoms: Secondary | ICD-10-CM | POA: Diagnosis not present

## 2022-01-01 DIAGNOSIS — I480 Paroxysmal atrial fibrillation: Secondary | ICD-10-CM | POA: Diagnosis not present

## 2022-01-01 DIAGNOSIS — K920 Hematemesis: Secondary | ICD-10-CM | POA: Diagnosis not present

## 2022-01-01 DIAGNOSIS — I700 Atherosclerosis of aorta: Secondary | ICD-10-CM | POA: Diagnosis not present

## 2022-01-01 DIAGNOSIS — R634 Abnormal weight loss: Secondary | ICD-10-CM | POA: Diagnosis not present

## 2022-01-01 DIAGNOSIS — R112 Nausea with vomiting, unspecified: Secondary | ICD-10-CM | POA: Diagnosis not present

## 2022-01-01 DIAGNOSIS — K3184 Gastroparesis: Secondary | ICD-10-CM | POA: Diagnosis present

## 2022-01-01 DIAGNOSIS — R1110 Vomiting, unspecified: Secondary | ICD-10-CM | POA: Diagnosis not present

## 2022-01-01 DIAGNOSIS — Z87891 Personal history of nicotine dependence: Secondary | ICD-10-CM | POA: Diagnosis not present

## 2022-01-01 DIAGNOSIS — K2101 Gastro-esophageal reflux disease with esophagitis, with bleeding: Secondary | ICD-10-CM | POA: Diagnosis present

## 2022-01-01 DIAGNOSIS — K2289 Other specified disease of esophagus: Secondary | ICD-10-CM | POA: Diagnosis not present

## 2022-01-01 DIAGNOSIS — Z85820 Personal history of malignant melanoma of skin: Secondary | ICD-10-CM | POA: Diagnosis not present

## 2022-01-01 DIAGNOSIS — Z8249 Family history of ischemic heart disease and other diseases of the circulatory system: Secondary | ICD-10-CM | POA: Diagnosis not present

## 2022-01-01 DIAGNOSIS — Z6823 Body mass index (BMI) 23.0-23.9, adult: Secondary | ICD-10-CM | POA: Diagnosis not present

## 2022-01-01 DIAGNOSIS — Z903 Acquired absence of stomach [part of]: Secondary | ICD-10-CM | POA: Diagnosis not present

## 2022-01-01 DIAGNOSIS — E538 Deficiency of other specified B group vitamins: Secondary | ICD-10-CM | POA: Diagnosis present

## 2022-01-01 DIAGNOSIS — H3500 Unspecified background retinopathy: Secondary | ICD-10-CM | POA: Diagnosis present

## 2022-01-01 DIAGNOSIS — N3001 Acute cystitis with hematuria: Secondary | ICD-10-CM | POA: Diagnosis not present

## 2022-01-01 DIAGNOSIS — I10 Essential (primary) hypertension: Secondary | ICD-10-CM | POA: Diagnosis not present

## 2022-01-01 DIAGNOSIS — N281 Cyst of kidney, acquired: Secondary | ICD-10-CM | POA: Diagnosis not present

## 2022-01-01 DIAGNOSIS — R109 Unspecified abdominal pain: Secondary | ICD-10-CM | POA: Diagnosis not present

## 2022-01-01 DIAGNOSIS — Z20822 Contact with and (suspected) exposure to covid-19: Secondary | ICD-10-CM | POA: Diagnosis not present

## 2022-01-01 DIAGNOSIS — Z801 Family history of malignant neoplasm of trachea, bronchus and lung: Secondary | ICD-10-CM | POA: Diagnosis not present

## 2022-01-01 DIAGNOSIS — G4733 Obstructive sleep apnea (adult) (pediatric): Secondary | ICD-10-CM | POA: Diagnosis present

## 2022-01-01 DIAGNOSIS — I4891 Unspecified atrial fibrillation: Secondary | ICD-10-CM | POA: Diagnosis not present

## 2022-01-01 DIAGNOSIS — Z79899 Other long term (current) drug therapy: Secondary | ICD-10-CM | POA: Diagnosis not present

## 2022-01-01 LAB — URINALYSIS, ROUTINE W REFLEX MICROSCOPIC
Bilirubin Urine: NEGATIVE
Glucose, UA: NEGATIVE mg/dL
Ketones, ur: 5 mg/dL — AB
Nitrite: NEGATIVE
Protein, ur: 30 mg/dL — AB
Specific Gravity, Urine: 1.014 (ref 1.005–1.030)
WBC, UA: >50 WBC/hpf — ABNORMAL HIGH (ref 0–5)
pH: 5 (ref 5.0–8.0)

## 2022-01-01 LAB — COMPREHENSIVE METABOLIC PANEL
ALT: 16 U/L (ref 0–44)
AST: 21 U/L (ref 15–41)
Albumin: 3.9 g/dL (ref 3.5–5.0)
Alkaline Phosphatase: 71 U/L (ref 38–126)
Anion gap: 14 (ref 5–15)
BUN: 25 mg/dL — ABNORMAL HIGH (ref 8–23)
CO2: 24 mmol/L (ref 22–32)
Calcium: 9.8 mg/dL (ref 8.9–10.3)
Chloride: 99 mmol/L (ref 98–111)
Creatinine, Ser: 1.32 mg/dL — ABNORMAL HIGH (ref 0.61–1.24)
GFR, Estimated: 53 mL/min — ABNORMAL LOW (ref >60)
Glucose, Bld: 163 mg/dL — ABNORMAL HIGH (ref 70–99)
Potassium: 4 mmol/L (ref 3.5–5.1)
Sodium: 137 mmol/L (ref 135–145)
Total Bilirubin: 1.3 mg/dL — ABNORMAL HIGH (ref 0.3–1.2)
Total Protein: 8.1 g/dL (ref 6.5–8.1)

## 2022-01-01 LAB — CBC WITH DIFFERENTIAL/PLATELET
Abs Immature Granulocytes: 0.05 10*3/uL (ref 0.00–0.07)
Basophils Absolute: 0 10*3/uL (ref 0.0–0.1)
Basophils Relative: 0 %
Eosinophils Absolute: 0 10*3/uL (ref 0.0–0.5)
Eosinophils Relative: 0 %
HCT: 46.1 % (ref 39.0–52.0)
Hemoglobin: 15.4 g/dL (ref 13.0–17.0)
Immature Granulocytes: 1 %
Lymphocytes Relative: 7 %
Lymphs Abs: 0.7 10*3/uL (ref 0.7–4.0)
MCH: 32.1 pg (ref 26.0–34.0)
MCHC: 33.4 g/dL (ref 30.0–36.0)
MCV: 96 fL (ref 80.0–100.0)
Monocytes Absolute: 0.8 10*3/uL (ref 0.1–1.0)
Monocytes Relative: 8 %
Neutro Abs: 8.4 10*3/uL — ABNORMAL HIGH (ref 1.7–7.7)
Neutrophils Relative %: 84 %
Platelets: 231 10*3/uL (ref 150–400)
RBC: 4.8 MIL/uL (ref 4.22–5.81)
RDW: 12.2 % (ref 11.5–15.5)
WBC: 10 10*3/uL (ref 4.0–10.5)
nRBC: 0 % (ref 0.0–0.2)

## 2022-01-01 LAB — LIPASE, BLOOD: Lipase: 29 U/L (ref 11–51)

## 2022-01-01 LAB — TYPE AND SCREEN
ABO/RH(D): B POS
Antibody Screen: NEGATIVE

## 2022-01-01 MED ORDER — ONDANSETRON 4 MG PO TBDP: 4.0000 mg | ORAL_TABLET | Freq: Once | ORAL | Status: DC

## 2022-01-01 MED ORDER — SODIUM CHLORIDE 0.9 % IV SOLN
1.0000 g | Freq: Once | INTRAVENOUS | Status: AC
  Administered 2022-01-01: 1 g via INTRAVENOUS
  Filled 2022-01-01: qty 10

## 2022-01-01 MED ORDER — MAGNESIUM CITRATE PO SOLN: 1.0000 | Freq: Once | ORAL | 0 refills | Status: AC

## 2022-01-01 MED ORDER — ONDANSETRON HCL 4 MG/2ML IJ SOLN
4.0000 mg | Freq: Once | INTRAMUSCULAR | Status: AC
  Administered 2022-01-01: 4 mg via INTRAVENOUS
  Filled 2022-01-01: qty 2

## 2022-01-01 MED ORDER — ONDANSETRON 4 MG PO TBDP: 4.0000 mg | ORAL_TABLET | Freq: Three times a day (TID) | ORAL | 0 refills | Status: DC | PRN

## 2022-01-01 MED ORDER — IOHEXOL 300 MG/ML  SOLN
100.0000 mL | Freq: Once | INTRAMUSCULAR | Status: AC | PRN
  Administered 2022-01-01: 100 mL via INTRAVENOUS

## 2022-01-01 MED ORDER — MORPHINE SULFATE (PF) 4 MG/ML IV SOLN
4.0000 mg | Freq: Once | INTRAVENOUS | Status: AC
  Administered 2022-01-01: 4 mg via INTRAVENOUS
  Filled 2022-01-01: qty 1

## 2022-01-01 MED ORDER — CIPROFLOXACIN HCL 250 MG PO TABS: 250.0000 mg | ORAL_TABLET | Freq: Two times a day (BID) | ORAL | 0 refills | Status: DC

## 2022-01-01 NOTE — ED Provider Triage Note (Signed)
Emergency Medicine Provider Triage Evaluation Note  Joseph Simpson , a 87 y.o. male  was evaluated in triage.  Pt complains of nausea, vomiting, left lower quadrant abdominal pain, constipation, and hematochezia.  Patient reports that he has been dealing with constipation over the last week.  States that over the last few days he has been having nausea and vomiting with any attempts to take p.o. intake.  Has had decreased p.o. intake due to this.  Patient reports he started having some pain to his left lower quadrant earlier this morning.  Patient went to have a bowel movement and noted nothing but bright red blood coming from his rectum.  Patient endorses feeling lightheaded at this time.  Denies any syncope.  Patient reports that he saw his PCP earlier this week.  Was given laxative and medication to treat a urinary tract infection.  Patient states that he was having some dysuria earlier this week however that has since resolved.  Patient endorses history of previous abdominal surgery.  Denies any blood thinner use.  Review of Systems  Positive: Nausea, vomiting, left lower quadrant abdominal pain, constipation, hematochezia, lightheadedness, DOP intake Negative: Coffee-ground emesis, bloody emesis, fever, chills, syncope  Physical Exam  BP 127/80 (BP Location: Right Arm)    Pulse (!) 111    Temp 98 F (36.7 C) (Oral)    Resp 16    SpO2 94%  Gen:   Awake, no distress   Resp:  Normal effort  MSK:   Moves extremities without difficulty  Other:  Abdomen soft, nondistended, tenderness to left lower quadrant, no guarding or rebound tenderness.  Negative CVA tenderness bilaterally.  Patient noted to be vomiting actively with bilious emesis.  0  Medical Decision Making  Medically screening exam initiated at 10:26 AM.  Appropriate orders placed.  Joseph Simpson was informed that the remainder of the evaluation will be completed by another provider, this initial triage assessment does not replace that  evaluation, and the importance of remaining in the ED until their evaluation is complete.  Patient noted to be tachycardic at rate of 111.  With reports of rectal bleeding and lightheadedness will make patient level to move back to next available room.  RN was notified.   Loni Beckwith, [place] 01/01/22 1028

## 2022-01-01 NOTE — Discharge Instructions (Addendum)
You came to the emergency department today to be evaluated for your constipation, rectal bleeding, abdominal pain, and urinary symptoms.  The CT scan of your abdomen showed that you have a kidney stone within your right kidney, should not cause any pain or discomfort until it moved out of your kidney.  Your imaging and lab work did show that you have a urinary tract infection.  After that she received antibiotics while in the emergency department.  I have given you prescription for antibiotics to take in the outpatient setting.  Due to reports of your rectal bleeding you will need to follow-up with [facility] in the outpatient setting.  They should call you in 2-3 business days to schedule a follow-up appointment.  If you do not hear from them in that timeframe please use the information on this paperwork to call and schedule follow-up appointment.  Due to constipation we are starting you on the medication magnesium citrate.  Please take this medication as prescribed.  Due to your nausea you were given a prescription for the medication called Zofran, you may take 1 pill every 8 hours as needed for nausea and vomiting.  Please follow-up closely with your primary care doctor for repeat evaluation.  Return to the emergency department if: New or increased rectal bleeding. Black or dark red stools. Vomit with blood or something that looks like coffee grounds. A fainting episode. Severe pain in your rectum. You have severe pain in your back or your lower abdomen. You have a fever or chills. You develop persistent nausea and vomiting and cannot keep down any fluids

## 2022-01-01 NOTE — ED Provider Notes (Signed)
[facility] EMERGENCY DEPARTMENT Provider Note   CSN: [id number] Arrival date & time: 01/01/22  1003     History  Chief Complaint  Patient presents with   Constipation   Abdominal Pain   unable to eat    Joseph Simpson is a 87 y.o. male presents to the emergency department with complaint of nausea, vomiting, left lower quadrant abdominal pain, constipation, and hematochezia.  Patient reports that he has been dealing with constipation over the last week.  States that his last bowel movement was on Monday.  Patient has been trying MiraLAX, drinking mineral oil, and a suppository laxative yesterday.  With no relief of his symptoms.  Patient states that today he had 2 episodes of hematochezia.  Patient is unable to quantify the exact amount of blood but states it was not too much.  Patient describes blood as bright red in color.  Patient had no pain with defecation during these episodes.  Patient reports having a colonoscopy 5 years prior.  States that I was with a physician in [place] who has since retired.  Patient reports that he has been dealing with left lower quadrant abdominal pain over the last 2 to 3 days.  Pain has become worse today.  Pain radiates to his left flank.  At present patient rates pain 7/10 on the pain scale.  Patient reports that on Monday he was having urinary frequency.  Went to see his primary care provider who started him on Flomax and an antibiotic.  Patient is unsure what the name of the antibiotic is.  Patient reports that he is having dysuria at present.  Patient states that he has had nausea and vomiting over the last few days.  Patient has had difficulty holding down p.o. intake due to his nausea and vomiting.  Denies any hematemesis, current emesis.  Emesis described as bilious in color.  Endorses history of abdominal surgery.   Constipation Associated symptoms: abdominal pain, dysuria, nausea and vomiting   Associated symptoms: no  back pain, no diarrhea and no fever   Abdominal Pain Associated symptoms: constipation, dysuria, nausea and vomiting   Associated symptoms: no chest pain, no chills, no diarrhea, no fever, no hematuria and no shortness of breath       Home Medications Prior to Admission medications   Medication Sig Start Date End Date Taking? Authorizing Provider  acetaminophen (TYLENOL) 500 MG tablet Take 500 mg by mouth 2 (two) times daily as needed for moderate pain or headache.    [provider]  atorvastatin (LIPITOR) 20 MG tablet TAKE ONE TABLET BY MOUTH THREE TIMES A WEEK 12/18/21   Marin Olp, MD  bevacizumab (AVASTIN) 2.5 mg/0.1 mL SOLN 2.5 mg by Intravitreal route. Injection to eyes every 5 weeks - use with vigamox    [provider]  Carboxymeth-Glycerin-Polysorb (REFRESH OPTIVE ADVANCED OP) Place 1 drop into both eyes daily.    [provider]  Carboxymethylcellulose Sodium 1 % GEL Place 1 drop into both eyes at bedtime.    [provider]  Cyanocobalamin (B-12 COMPLIANCE INJECTION IJ) Inject 1 Dose as directed every 30 (thirty) days.    [provider]  losartan (COZAAR) 100 MG tablet Take one-half tablet by mouth twice daily 12/18/21   Marin Olp, MD  metoCLOPramide (REGLAN) 5 MG tablet Take 5 mg by mouth 2 (two) times daily.    [provider]  metoprolol tartrate (LOPRESSOR) 25 MG tablet Take 0.5 tablets (12.5 mg total)  by mouth 2 (two) times daily. 12/18/21   Martin[place], Ophie Burrowes M, MD  pantoprazole (PROTONIX) 40 MG tablet Take 40 mg by mouth 2 (two) times daily.    [provider]  polyethylene glycol (MIRALAX / GLYCOLAX) 17 g packet Take 17 g by mouth daily as needed for moderate constipation.    [provider]  sennosides-docusate sodium (SENOKOT-S) 8.6-50 MG tablet Take 2 tablets by mouth daily. Patient taking differently: Take 2 tablets by mouth as needed. 02/12/21   Merlene Pulling K, PA-C  VIGAMOX 0.5 % ophthalmic  solution Place 1 drop into both eyes as directed. Take every 5 weeks as directed - use with avastin, instill 1 drop into both eyes 4 times daily for 2 days after eye injections 05/28/14   [provider]      Allergies    Aspirin, Percocet [oxycodone-acetaminophen], and Vitamins [apatate]    Review of Systems   Review of Systems  Constitutional:  Negative for chills and fever.  Eyes:  Negative for visual disturbance.  Respiratory:  Negative for shortness of breath.   Cardiovascular:  Negative for chest pain.  Gastrointestinal:  Positive for abdominal pain, anal bleeding, constipation, nausea and vomiting. Negative for abdominal distention, blood in stool, diarrhea and rectal pain.  Genitourinary:  Positive for dysuria and frequency. Negative for difficulty urinating, hematuria, penile discharge, penile swelling, scrotal swelling and testicular pain.  Musculoskeletal:  Negative for back pain and neck pain.  Skin:  Negative for color change and rash.  Neurological:  Negative for dizziness, syncope, light-headedness and headaches.  Psychiatric/Behavioral:  Negative for confusion.    Physical Exam Updated Vital Signs BP 123/75 (BP Location: Right Arm)    Pulse 98    Temp (!) 97.1 F (36.2 C)    Resp 17    SpO2 100%  Physical Exam Vitals and nursing note reviewed. Chaperone present: Male RN present as Producer, television/film/video.  Constitutional:      General: He is not in acute distress.    Appearance: He is not ill-appearing, toxic-appearing or diaphoretic.  HENT:     Head: Normocephalic.  Eyes:     General: No scleral icterus.       Right eye: No discharge.        Left eye: No discharge.  Cardiovascular:     Rate and Rhythm: Normal rate.  Pulmonary:     Effort: Pulmonary effort is normal.  Abdominal:     General: Abdomen is flat. Bowel sounds are normal. There is no distension. There are no signs of injury.     Palpations: Abdomen is soft. There is no mass or pulsatile mass.      Tenderness: There is abdominal tenderness in the left lower quadrant. There is left CVA tenderness. There is no right CVA tenderness, guarding or rebound.     Hernia: There is no hernia in the umbilical area or ventral area.  Genitourinary:    Rectum: No mass, tenderness, anal fissure, external hemorrhoid or internal hemorrhoid. Normal anal tone.     Comments: No stool was noted to be in rectal vault.  No frank red blood on exam. Skin:    General: Skin is warm and dry.  Neurological:     General: No focal deficit present.     Mental Status: He is alert.     GCS: GCS eye subscore is 4. GCS verbal subscore is 5. GCS motor subscore is 6.  Psychiatric:        Behavior: Behavior is cooperative.  ED Results / Procedures / Treatments   Labs (all labs ordered are listed, but only abnormal results are displayed) Labs Reviewed  COMPREHENSIVE METABOLIC PANEL - Abnormal; Notable for the following components:      Result Value   Glucose, Bld 163 (*)    BUN 25 (*)    Creatinine, Ser 1.32 (*)    Total Bilirubin 1.3 (*)    GFR, Estimated 53 (*)    All other components within normal limits  CBC WITH DIFFERENTIAL/PLATELET - Abnormal; Notable for the following components:   Neutro Abs 8.4 (*)    All other components within normal limits  URINALYSIS, ROUTINE W REFLEX MICROSCOPIC - Abnormal; Notable for the following components:   Color, Urine AMBER (*)    APPearance CLOUDY (*)    Hgb urine dipstick SMALL (*)    Ketones, ur 5 (*)    Protein, ur 30 (*)    Leukocytes,Ua LARGE (*)    WBC, UA >50 (*)    Bacteria, UA MANY (*)    Non Squamous Epithelial 0-5 (*)    All other components within normal limits  URINE CULTURE  LIPASE, BLOOD  POC OCCULT BLOOD, ED  TYPE AND SCREEN  ABO/RH    EKG None  Radiology CT ABDOMEN PELVIS W CONTRAST  Result Date: 01/01/2022 CLINICAL DATA:  Left lower quadrant abdominal pain. EXAM: CT ABDOMEN AND PELVIS WITH CONTRAST TECHNIQUE: Multidetector CT imaging of  the abdomen and pelvis was performed using the standard protocol following bolus administration of intravenous contrast. RADIATION DOSE REDUCTION: This exam was performed according to the departmental dose-optimization program which includes automated exposure control, adjustment of the mA and/or kV according to patient size and/or use of iterative reconstruction technique. CONTRAST:  181mL OMNIPAQUE IOHEXOL 300 MG/ML  SOLN COMPARISON:  02/20/2013 FINDINGS: Lower chest: Unremarkable. Hepatobiliary: No suspicious focal abnormality within the liver parenchyma. There is no evidence for gallstones, gallbladder wall thickening, or pericholecystic fluid. No intrahepatic or extrahepatic biliary dilation. Pancreas: No focal mass lesion. No dilatation of the main duct. No intraparenchymal cyst. No peripancreatic edema. Spleen: No splenomegaly. No focal mass lesion. Adrenals/Urinary Tract: No adrenal nodule or mass. Cortical scarring noted in the right kidney with a 2 x 4 mm nonobstructing stone in the lower pole right kidney. No stones are seen in the left kidney. No suspicious enhancing renal mass lesion. Patient is noted to have hyperenhancement of the urothelium in the right renal pelvis and right ureter associated with relatively diffuse mucosal hyperenhancement in the bladder. Bladder wall trabeculation and multiple tiny bladder wall diverticuli evident. There is gas in the bladder lumen, presumably secondary to recent instrumentation. Stomach/Bowel: Status post distal gastrectomy. Duodenum unremarkable. No small bowel wall thickening. No small bowel dilatation. The terminal ileum is normal. The appendix is not well visualized, but there is no edema or inflammation in the region of the cecum. No gross colonic mass. No colonic wall thickening. Diverticular changes are noted in the left colon without evidence of diverticulitis. Vascular/Lymphatic: There is mild atherosclerotic calcification of the abdominal aorta without  aneurysm. There is no gastrohepatic or hepatoduodenal ligament lymphadenopathy. No retroperitoneal or mesenteric lymphadenopathy. No pelvic sidewall lymphadenopathy. Reproductive: Prostate gland is enlarged. Other: No intraperitoneal free fluid. Musculoskeletal: Status post left total hip replacement. No worrisome lytic or sclerotic osseous abnormality. IMPRESSION: 1. 2 x 4 mm nonobstructing stone in the lower pole of the right kidney. 2. Patient is noted to have hyperenhancement of the urothelium in the right renal pelvis and right  ureter associated with relatively diffuse mucosal hyperenhancement in the bladder. Imaging features are compatible with infection/inflammation. 3. Bladder wall trabeculation and multiple tiny bladder wall diverticuli evident suggesting component of bladder outlet obstruction. There is gas in the bladder lumen, presumably secondary to recent instrumentation. In the absence of recent instrumentation, bladder infection would be a distinct concern. 4. Prostatomegaly. 5. Left colonic diverticulosis without diverticulitis. 6. Aortic Atherosclerosis (ICD10-I70.0). Electronically Signed   By: Misty Stanley M.D.   On: 01/01/2022 12:25    Procedures Procedures    Medications Ordered in ED Medications  iohexol (OMNIPAQUE) 300 MG/ML solution 100 mL (100 mLs Intravenous Contrast Given 01/01/22 1217)  morphine (PF) 4 MG/ML injection 4 mg (4 mg Intravenous Given 01/01/22 1606)  ondansetron (ZOFRAN) injection 4 mg (4 mg Intravenous Given 01/01/22 1606)  cefTRIAXone (ROCEPHIN) 1 g in sodium chloride 0.9 % 100 mL IVPB (0 g Intravenous Stopped 01/01/22 1642)    ED Course/ Medical Decision Making/ A&P Clinical Course as of 01/01/22 1817  Thu Jan 01, 2022  1444 Spoke to Dr. Junious Silk with urology who advised plan of treatment of antibiotics and follow-up in the outpatient setting  [PB]  1754 Spoke to Dr. Rush Landmark with [facility] gastroenterology who advised that if patient is hemodynamically  stable and not being discharged to the hospital they can follow-up with [facility] in the outpatient setting.  If he is going to be admitted they will see him for consult while in the hospital. [PB]    Clinical Course User Index [PB] Loni Beckwith, PA-C                           Medical Decision Making Amount and/or Complexity of Data Reviewed Labs: ordered. Decision-making details documented in ED Course. Radiology: ordered.  Risk OTC drugs. Prescription drug management.   Alert 85 compatible with infection/inflammation.  male in no acute distress, nontoxic-appearing.  Presents to the emergency department with a chief complaint of constipation, hematochezia, abdominal pain.  Information was obtained from patient.  Medical records were reviewed including previous provider notes and labs.  Patient has past medical history as noted in HPI which complicates his care.  Due to reports of constipation, nausea and vomiting with history of abdominal surgery concern for possible small bowel obstruction.  Due to tenderness to left lower quadrant and rectal bleeding concern for possible diverticulitis.  Reports of urinary symptoms and flank pain concern for renal calculus.  CT abdomen pelvis was ordered for further evaluation.  Lab work was independently reviewed myself.  Pertinent findings include: -Urinalysis shows bacteria many, WBC greater than 50, leukocyte large, nitrite negative. -CBC unremarkable with no leukocytosis or anemia -CMP shows AKI with creatinine at 1.32 -Lipase within normal limits.  CT abdomen pelvis shows 2 x 4 mm nonobstructing stone in the lower pole of right kidney.  Hyperenhancement of the urothelium in the right renal pelvis and right ureter associated with relatively diffuse mucosal hyperenhancement. Bladder wall trabeculation and multiple tiny bladder wall diverticuli evident suggesting component of bladder outlet obstruction  On rectal exam patient had no frank  red blood or melena noted.  There was no stool in his rectal vault.  Due to reports of hematochezia we will consult gastroenterologist.  Spoke to Dr. Rush Landmark as noted above.  Due to reports recent cytoscopy, AKI, and UTI in setting of renal calculus will speak to on-call urologist.  Spoke to Dr. Junious Silk as noted above  Admission was considered  however patient hemodynamically stable.  On serial repeat examination abdomen soft, nondistended, improvement in tenderness.  Patient has resolution of earlier tachycardia.  Reports improvement in pain after receiving morphine.  Has resolution of nausea.  We will plan to p.o. challenge patient at this time.  If able to tolerate p.o. challenge prior to discharge to follow-up with GI in the outpatient setting.  Patient able to handle p.o. intake without difficulty.  Patient reports that while in the emergency department he was contacted by his primary care doctor who stated that he needed to be switched to Cipro for his urinary tract infection.  I suspect that patient had culture performed earlier this week.  We will prescribe patient with course of Cipro and advised him to follow-up with his primary care provider.  Discussed results, findings, treatment and follow up. Patient advised of return precautions. Patient verbalized understanding and agreed with plan.         Final Clinical Impression(s) / ED Diagnoses Final diagnoses:  Urinary tract infection without hematuria, site unspecified  Renal calculus  Constipation, unspecified constipation type  Rectal bleeding    Rx / DC Orders ED Discharge Orders          Ordered    Ambulatory referral to Gastroenterology        01/01/22 1821    ondansetron (ZOFRAN-ODT) 4 MG disintegrating tablet  Every 8 hours PRN        01/01/22 1823    magnesium citrate SOLN   Once        01/01/22 1834    ciprofloxacin (CIPRO) 250 MG tablet  Every 12 hours        01/01/22 1839              Dyann Ruddle 01/01/22 Roscoe, Ankit, MD 01/02/22 1005

## 2022-01-01 NOTE — ED Triage Notes (Signed)
Pt. Stated, Joseph Simpson not been able to have a bowel movement in over a week and I cant eat. I went to Dr. On Tuesday and gave me some suppository and some antibiotics (urinary problem). This morning when I had a bowel movement it was pure blood.

## 2022-01-03 ENCOUNTER — Other Ambulatory Visit: Payer: Self-pay

## 2022-01-03 ENCOUNTER — Encounter (HOSPITAL_COMMUNITY): Payer: Self-pay

## 2022-01-03 ENCOUNTER — Emergency Department (HOSPITAL_COMMUNITY): Payer: Medicare Other

## 2022-01-03 ENCOUNTER — Inpatient Hospital Stay (HOSPITAL_COMMUNITY)
Admission: EM | Admit: 2022-01-03 | Discharge: 2022-01-09 | DRG: 377 | Disposition: A | Discharge status: Home Health | Payer: Medicare Other | Attending: Internal Medicine | Admitting: Internal Medicine

## 2022-01-03 DIAGNOSIS — N200 Calculus of kidney: Secondary | ICD-10-CM | POA: Diagnosis not present

## 2022-01-03 DIAGNOSIS — N400 Enlarged prostate without lower urinary tract symptoms: Secondary | ICD-10-CM | POA: Diagnosis present

## 2022-01-03 DIAGNOSIS — K5900 Constipation, unspecified: Secondary | ICD-10-CM | POA: Diagnosis present

## 2022-01-03 DIAGNOSIS — K2101 Gastro-esophageal reflux disease with esophagitis, with bleeding: Secondary | ICD-10-CM | POA: Diagnosis present

## 2022-01-03 DIAGNOSIS — N390 Urinary tract infection, site not specified: Secondary | ICD-10-CM | POA: Diagnosis not present

## 2022-01-03 DIAGNOSIS — Z8249 Family history of ischemic heart disease and other diseases of the circulatory system: Secondary | ICD-10-CM | POA: Diagnosis not present

## 2022-01-03 DIAGNOSIS — R1110 Vomiting, unspecified: Secondary | ICD-10-CM | POA: Diagnosis not present

## 2022-01-03 DIAGNOSIS — K254 Chronic or unspecified gastric ulcer with hemorrhage: Secondary | ICD-10-CM | POA: Diagnosis present

## 2022-01-03 DIAGNOSIS — H3500 Unspecified background retinopathy: Secondary | ICD-10-CM | POA: Diagnosis present

## 2022-01-03 DIAGNOSIS — Z96642 Presence of left artificial hip joint: Secondary | ICD-10-CM | POA: Diagnosis present

## 2022-01-03 DIAGNOSIS — I4891 Unspecified atrial fibrillation: Secondary | ICD-10-CM | POA: Diagnosis present

## 2022-01-03 DIAGNOSIS — K2971 Gastritis, unspecified, with bleeding: Secondary | ICD-10-CM | POA: Diagnosis present

## 2022-01-03 DIAGNOSIS — N3001 Acute cystitis with hematuria: Secondary | ICD-10-CM | POA: Diagnosis present

## 2022-01-03 DIAGNOSIS — K3184 Gastroparesis: Secondary | ICD-10-CM | POA: Diagnosis present

## 2022-01-03 DIAGNOSIS — E785 Hyperlipidemia, unspecified: Secondary | ICD-10-CM | POA: Diagnosis present

## 2022-01-03 DIAGNOSIS — Z8719 Personal history of other diseases of the digestive system: Secondary | ICD-10-CM

## 2022-01-03 DIAGNOSIS — K922 Gastrointestinal hemorrhage, unspecified: Secondary | ICD-10-CM | POA: Diagnosis present

## 2022-01-03 DIAGNOSIS — Z79899 Other long term (current) drug therapy: Secondary | ICD-10-CM | POA: Diagnosis not present

## 2022-01-03 DIAGNOSIS — R112 Nausea with vomiting, unspecified: Secondary | ICD-10-CM | POA: Diagnosis not present

## 2022-01-03 DIAGNOSIS — Z85820 Personal history of malignant melanoma of skin: Secondary | ICD-10-CM

## 2022-01-03 DIAGNOSIS — R0902 Hypoxemia: Secondary | ICD-10-CM | POA: Diagnosis not present

## 2022-01-03 DIAGNOSIS — Z801 Family history of malignant neoplasm of trachea, bronchus and lung: Secondary | ICD-10-CM | POA: Diagnosis not present

## 2022-01-03 DIAGNOSIS — I2510 Atherosclerotic heart disease of native coronary artery without angina pectoris: Secondary | ICD-10-CM | POA: Diagnosis present

## 2022-01-03 DIAGNOSIS — E538 Deficiency of other specified B group vitamins: Secondary | ICD-10-CM | POA: Diagnosis present

## 2022-01-03 DIAGNOSIS — Z903 Acquired absence of stomach [part of]: Secondary | ICD-10-CM | POA: Diagnosis not present

## 2022-01-03 DIAGNOSIS — I10 Essential (primary) hypertension: Secondary | ICD-10-CM | POA: Diagnosis present

## 2022-01-03 DIAGNOSIS — K920 Hematemesis: Secondary | ICD-10-CM

## 2022-01-03 DIAGNOSIS — Z6823 Body mass index (BMI) 23.0-23.9, adult: Secondary | ICD-10-CM

## 2022-01-03 DIAGNOSIS — Z20822 Contact with and (suspected) exposure to covid-19: Secondary | ICD-10-CM | POA: Diagnosis present

## 2022-01-03 DIAGNOSIS — G4733 Obstructive sleep apnea (adult) (pediatric): Secondary | ICD-10-CM | POA: Diagnosis present

## 2022-01-03 DIAGNOSIS — K209 Esophagitis, unspecified without bleeding: Secondary | ICD-10-CM | POA: Diagnosis not present

## 2022-01-03 DIAGNOSIS — R634 Abnormal weight loss: Secondary | ICD-10-CM | POA: Diagnosis not present

## 2022-01-03 DIAGNOSIS — N281 Cyst of kidney, acquired: Secondary | ICD-10-CM | POA: Diagnosis not present

## 2022-01-03 DIAGNOSIS — I480 Paroxysmal atrial fibrillation: Secondary | ICD-10-CM | POA: Diagnosis present

## 2022-01-03 DIAGNOSIS — K2289 Other specified disease of esophagus: Secondary | ICD-10-CM | POA: Diagnosis not present

## 2022-01-03 DIAGNOSIS — Z87891 Personal history of nicotine dependence: Secondary | ICD-10-CM | POA: Diagnosis not present

## 2022-01-03 DIAGNOSIS — K921 Melena: Secondary | ICD-10-CM | POA: Diagnosis present

## 2022-01-03 DIAGNOSIS — I493 Ventricular premature depolarization: Secondary | ICD-10-CM | POA: Diagnosis present

## 2022-01-03 DIAGNOSIS — E440 Moderate protein-calorie malnutrition: Secondary | ICD-10-CM | POA: Diagnosis present

## 2022-01-03 DIAGNOSIS — R1111 Vomiting without nausea: Secondary | ICD-10-CM | POA: Diagnosis not present

## 2022-01-03 DIAGNOSIS — R109 Unspecified abdominal pain: Secondary | ICD-10-CM | POA: Diagnosis not present

## 2022-01-03 DIAGNOSIS — R9439 Abnormal result of other cardiovascular function study: Secondary | ICD-10-CM | POA: Diagnosis present

## 2022-01-03 DIAGNOSIS — K3189 Other diseases of stomach and duodenum: Secondary | ICD-10-CM | POA: Diagnosis not present

## 2022-01-03 DIAGNOSIS — R5381 Other malaise: Secondary | ICD-10-CM

## 2022-01-03 DIAGNOSIS — I700 Atherosclerosis of aorta: Secondary | ICD-10-CM | POA: Diagnosis not present

## 2022-01-03 DIAGNOSIS — N1330 Unspecified hydronephrosis: Secondary | ICD-10-CM

## 2022-01-03 LAB — LIPASE, BLOOD: Lipase: 30 U/L (ref 11–51)

## 2022-01-03 LAB — CBC WITH DIFFERENTIAL/PLATELET
Abs Immature Granulocytes: 0.04 10*3/uL (ref 0.00–0.07)
Basophils Absolute: 0 10*3/uL (ref 0.0–0.1)
Basophils Relative: 0 %
Eosinophils Absolute: 0 10*3/uL (ref 0.0–0.5)
Eosinophils Relative: 0 %
HCT: 44.8 % (ref 39.0–52.0)
Hemoglobin: 15 g/dL (ref 13.0–17.0)
Immature Granulocytes: 1 %
Lymphocytes Relative: 6 %
Lymphs Abs: 0.5 10*3/uL — ABNORMAL LOW (ref 0.7–4.0)
MCH: 32.5 pg (ref 26.0–34.0)
MCHC: 33.5 g/dL (ref 30.0–36.0)
MCV: 97.2 fL (ref 80.0–100.0)
Monocytes Absolute: 0.3 10*3/uL (ref 0.1–1.0)
Monocytes Relative: 4 %
Neutro Abs: 8 10*3/uL — ABNORMAL HIGH (ref 1.7–7.7)
Neutrophils Relative %: 89 %
Platelets: 216 10*3/uL (ref 150–400)
RBC: 4.61 MIL/uL (ref 4.22–5.81)
RDW: 12.1 % (ref 11.5–15.5)
WBC: 8.9 10*3/uL (ref 4.0–10.5)
nRBC: 0 % (ref 0.0–0.2)

## 2022-01-03 LAB — COMPREHENSIVE METABOLIC PANEL
ALT: 18 U/L (ref 0–44)
AST: 28 U/L (ref 15–41)
Albumin: 4 g/dL (ref 3.5–5.0)
Alkaline Phosphatase: 61 U/L (ref 38–126)
Anion gap: 10 (ref 5–15)
BUN: 31 mg/dL — ABNORMAL HIGH (ref 8–23)
CO2: 29 mmol/L (ref 22–32)
Calcium: 9.1 mg/dL (ref 8.9–10.3)
Chloride: 99 mmol/L (ref 98–111)
Creatinine, Ser: 1.07 mg/dL (ref 0.61–1.24)
GFR, Estimated: >60 mL/min (ref >60)
Glucose, Bld: 134 mg/dL — ABNORMAL HIGH (ref 70–99)
Potassium: 4.7 mmol/L (ref 3.5–5.1)
Sodium: 138 mmol/L (ref 135–145)
Total Bilirubin: 1.3 mg/dL — ABNORMAL HIGH (ref 0.3–1.2)
Total Protein: 8.1 g/dL (ref 6.5–8.1)

## 2022-01-03 LAB — URINALYSIS, ROUTINE W REFLEX MICROSCOPIC
Bilirubin Urine: NEGATIVE
Glucose, UA: NEGATIVE mg/dL
Ketones, ur: 5 mg/dL — AB
Nitrite: POSITIVE — AB
Protein, ur: 30 mg/dL — AB
Specific Gravity, Urine: 1.016 (ref 1.005–1.030)
WBC, UA: >50 WBC/hpf — ABNORMAL HIGH (ref 0–5)
pH: 5 (ref 5.0–8.0)

## 2022-01-03 LAB — RESP PANEL BY RT-PCR (FLU A&B, COVID) ARPGX2
Influenza A by PCR: NEGATIVE
Influenza B by PCR: NEGATIVE
SARS Coronavirus 2 by RT PCR: NEGATIVE

## 2022-01-03 LAB — TYPE AND SCREEN
ABO/RH(D): B POS
Antibody Screen: NEGATIVE

## 2022-01-03 LAB — LACTIC ACID, PLASMA: Lactic Acid, Venous: 1.5 mmol/L (ref 0.5–1.9)

## 2022-01-03 LAB — HEMOGLOBIN AND HEMATOCRIT, BLOOD
HCT: 43.5 % (ref 39.0–52.0)
Hemoglobin: 14.6 g/dL (ref 13.0–17.0)

## 2022-01-03 LAB — POC OCCULT BLOOD, ED: Fecal Occult Bld: NEGATIVE

## 2022-01-03 MED ORDER — SODIUM CHLORIDE 0.9 % IV SOLN
1.0000 g | INTRAVENOUS | Status: DC
  Filled 2022-01-03: qty 10

## 2022-01-03 MED ORDER — LOSARTAN POTASSIUM 25 MG PO TABS
25.0000 mg | ORAL_TABLET | Freq: Every day | ORAL | Status: DC
  Filled 2022-01-03: qty 1

## 2022-01-03 MED ORDER — LORAZEPAM 0.5 MG PO TABS
0.5000 mg | ORAL_TABLET | Freq: Once | ORAL | Status: AC
Start: 2022-01-03 — End: 2022-01-03
  Administered 2022-01-03: 0.5 mg via SUBLINGUAL
  Filled 2022-01-03: qty 1

## 2022-01-03 MED ORDER — HYDRALAZINE HCL 20 MG/ML IJ SOLN
5.0000 mg | Freq: Four times a day (QID) | INTRAMUSCULAR | Status: AC | PRN
  Administered 2022-01-03 – 2022-01-04 (×2): 5 mg via INTRAVENOUS
  Filled 2022-01-03 (×2): qty 1

## 2022-01-03 MED ORDER — METOPROLOL TARTRATE 25 MG PO TABS: 12.5000 mg | ORAL_TABLET | Freq: Two times a day (BID) | ORAL | Status: DC

## 2022-01-03 MED ORDER — FENTANYL CITRATE PF 50 MCG/ML IJ SOSY: 50.0000 ug | PREFILLED_SYRINGE | Freq: Once | INTRAMUSCULAR | Status: DC

## 2022-01-03 MED ORDER — ONDANSETRON HCL 4 MG/2ML IJ SOLN: 4.0000 mg | Freq: Once | INTRAMUSCULAR | Status: DC

## 2022-01-03 MED ORDER — IOHEXOL 300 MG/ML  SOLN
100.0000 mL | Freq: Once | INTRAMUSCULAR | Status: AC | PRN
  Administered 2022-01-03: 100 mL via INTRAVENOUS

## 2022-01-03 MED ORDER — ACETAMINOPHEN 500 MG PO TABS: 500.0000 mg | ORAL_TABLET | Freq: Two times a day (BID) | ORAL | Status: DC | PRN

## 2022-01-03 MED ORDER — SODIUM CHLORIDE 0.9 % IV SOLN
1.0000 g | Freq: Once | INTRAVENOUS | Status: AC
  Administered 2022-01-03: 1 g via INTRAVENOUS
  Filled 2022-01-03: qty 10

## 2022-01-03 MED ORDER — ONDANSETRON HCL 4 MG/2ML IJ SOLN
4.0000 mg | Freq: Four times a day (QID) | INTRAMUSCULAR | Status: AC | PRN
  Administered 2022-01-03 (×2): 4 mg via INTRAVENOUS
  Filled 2022-01-03 (×2): qty 2

## 2022-01-03 MED ORDER — PANTOPRAZOLE 80MG IVPB - SIMPLE MED
80.0000 mg | Freq: Once | INTRAVENOUS | Status: AC
Start: 2022-01-03 — End: 2022-01-03
  Administered 2022-01-03: 80 mg via INTRAVENOUS
  Filled 2022-01-03: qty 80

## 2022-01-03 MED ORDER — TAMSULOSIN HCL 0.4 MG PO CAPS
0.4000 mg | ORAL_CAPSULE | Freq: Every day | ORAL | Status: DC
Start: 2022-01-03 — End: 2022-01-09
  Administered 2022-01-04 – 2022-01-09 (×5): 0.4 mg via ORAL
  Filled 2022-01-03 (×6): qty 1

## 2022-01-03 MED ORDER — PANTOPRAZOLE SODIUM 40 MG IV SOLR: 40.0000 mg | Freq: Two times a day (BID) | INTRAVENOUS | Status: DC

## 2022-01-03 MED ORDER — SODIUM CHLORIDE 0.9 % IV BOLUS
1000.0000 mL | Freq: Once | INTRAVENOUS | Status: AC
  Administered 2022-01-03: 1000 mL via INTRAVENOUS

## 2022-01-03 MED ORDER — SODIUM CHLORIDE 0.9 % IV SOLN: INTRAVENOUS | Status: DC

## 2022-01-03 MED ORDER — PANTOPRAZOLE INFUSION (NEW) - SIMPLE MED
8.0000 mg/h | INTRAVENOUS | Status: DC
  Administered 2022-01-03 – 2022-01-06 (×6): 8 mg/h via INTRAVENOUS
  Filled 2022-01-03: qty 100
  Filled 2022-01-03 (×6): qty 80
  Filled 2022-01-03: qty 100

## 2022-01-03 NOTE — Assessment & Plan Note (Signed)
Continue Flomax and follow closely with Dr. Diona Fanti

## 2022-01-03 NOTE — Assessment & Plan Note (Deleted)
Keep n.p.o. GTT Protonix, fluids 125 cc/H Trend hemoglobin every 12 today-as hemodynamically stable but with possibility of decompensation will send to progressive-does not need stepdown just yet but will watch closely

## 2022-01-03 NOTE — ED Notes (Addendum)
Pt to remain NPO at this time. Per Hospitalist's written order.

## 2022-01-03 NOTE — Plan of Care (Signed)

## 2022-01-03 NOTE — ED Notes (Signed)
Hospitalist at the bedside 

## 2022-01-03 NOTE — Assessment & Plan Note (Signed)
Keep n.p.o. GTT Protonix, fluids 125 cc/H Trend hemoglobin every 12 today-as hemodynamically stable but with possibility of decompensation will send to progressive-does not need stepdown just yet but will watch closely

## 2022-01-03 NOTE — ED Notes (Signed)
CT at the bedside.

## 2022-01-03 NOTE — Assessment & Plan Note (Signed)
Recent complicated urological history Is on day 3 of ciprofloxacin from emergency room visit-transition to ceftriaxone 1 g every 24 for 2 days until E. coli sensitivities return Dr. Diona Fanti to be made aware on Monday if patient is still here regarding issues with continued nephrolithiasis given recent surgery in January 2023 No current indication for urology involvement

## 2022-01-03 NOTE — ED Notes (Signed)
Xray at the bedside.

## 2022-01-03 NOTE — ED Notes (Signed)
Declining narcotic pain medication at this time.

## 2022-01-03 NOTE — ED Notes (Signed)
EDP at the bedside.  ?

## 2022-01-03 NOTE — ED Triage Notes (Signed)
Pt presents to the ED via EMS from home with a 4wk hx of UTI. Pt was seen at [facility] x1 week ago. Pt was d/c'd at that time and has had nausea and vomiting sfor the past 4days. Pt reports coffee ground emesis. Pt reports blood in stool as well. Pt denies blood thinner use. Pt states he has a hx of ulcers "years ago" with bloody emesis at that time. Pt states his last normal BM was over 9days ago and reports increased weakness over the past week. Pt denies SOB and CP. Pt has a known hx of A-fib and kidney stones. He reports he last urinated around 0900 this morning with no issues.

## 2022-01-03 NOTE — Assessment & Plan Note (Signed)
Requires outpatient follow-up

## 2022-01-03 NOTE — Assessment & Plan Note (Addendum)
-  Currently rate controlled.  Not on anticoagulation because of GI bleed. -Continue metoprolol.  Cardiology signed off.  Outpatient follow-up with cardiology.

## 2022-01-03 NOTE — Assessment & Plan Note (Signed)
Unclear if taking or using CPAP

## 2022-01-03 NOTE — Assessment & Plan Note (Addendum)
-   Patient had nonobstructive CAD as per cath in 2013 - Currently stable.  Continue medical management with metoprolol, losartan.  Outpatient follow-up with cardiology.

## 2022-01-03 NOTE — Hospital Course (Addendum)
87 year old male with history of nephrolithiasis, squamous cell cancer and melanoma, nonobstructive CAD, GERD/gastroparesis, hypertension, hyperlipidemia, prior gastric surgery for ulcer presented with coffee-ground emesis.  GI was consulted.  He also was found to have new onset A-fib.  Cardiology was consulted.  Currently rate controlled, cardiology has signed off.  He underwent EGD on 01/06/2022 which showed LA grade D esophagitis along with gastritis and gastric ulcerations. Subsequently, he has tolerated advanced diet.  He has also completed 5-day course of IV meropenem for ESBL in urine culture and his Foley catheter has been removed.  He will be discharged home today with home health PT.  Outpatient follow-up with PCP/GI/urology.

## 2022-01-03 NOTE — ED Notes (Signed)
This nurse contacted the lab regarding pt's blood work sent at 11:48am. Lab running blood work at this time. EDP notified and aware.

## 2022-01-03 NOTE — H&P (Addendum)
History and Physical    Patient: Joseph Simpson:[id number] DOB: [birth year] DOA: 01/03/2022 DOS: the patient was seen and examined on 01/03/2022 PCP: Marin Olp, MD  Patient coming from: Home  Chief Complaint:  Chief Complaint  Patient presents with   GI Bleeding    HPI:  87 community dwelling white male--very active lives independently with his grandson still drives motor bikes-quit smoking drinking age 87 Known history of nephrolithiasis Squamous cell CA and melanoma ? CAD with nonobstructive CAD 2013 -P Afib versus PVC foll Dr. P [place] [facility] GERD/gastroparesis, HTN HLD Prior ? [place] in ? 2013 past for ulcer? colonoscopy 5 years prior?    Initial issues started about 1 month ago when he went to see his urologist Dr. Dorina Hoyer had about 40 bladder stones-these were removed-return to ED 1/18 inability to void-Foley placed sent home  Return ED visit 2/16 2 episodes hematochezia?  Not a lot of blood-no pain during defecation  --at that visit also had left flank pain--Rx at that time for 2X for nonobstructing right lower pole kidney stone with hyperenhancement of the urothelium-imaging showed bladder trabeculation also with BOO--Dr. Junious Silk rec  conservative management patient was given a dose of Rocephin and told to continue Cipro and follow-up Because of the hematochezia-Dr. Mansouraty was consulted and due to no stool in vault no frank red blood or melena-and soft abdomen patient was discharged home  Returns today with 2 days of coffee-ground emesis interspersed with clear yellow fluid-positive flatus, no hematochezia since 216 visit however No chest pain no chills no rigors no unilateral weakness no blurred vision no double vision no falls no strokelike symptoms no rash no fever no chills no ill contacts no cough no cold    Data BUNs/creatinine elevated to 31/1.07 above baseline 15/0.7 LFTs normal Lactic acid 1.5--WBC 8.9 Rest of chemistry panel normal  Urine  culture from 2/16 growing >100,000 CFU E. coli-sensitivities still pending  Hemoglobin 15 WBC 8.9 baseline hemoglobin 15  In ED Rx pantoprazole gtt., saline bolus 1 L then 125 cc/H, Rocephin X1, Zofran 4 mg Initially patient started on clear liquid diet-I made him n.p.o. until GI sees  Review of Systems: As mentioned in the history of present illness. All other systems reviewed and are negative. Past Medical History:  Diagnosis Date   Arthritis    B12 deficiency    Bladder calculi    CAD (coronary artery disease)    cardiologist--- dr Martinique;   abnormal nuclear stress test 11/ 2013;  s/p cardiac cath 10-21-2012 minimal nonobstructiive CAD w/ normal LVF   Dyslipidemia    Full dentures    Gastroparesis    followed by [name] PA ( [facility] in [place]) s/p gastrectomy for ulcers in 1970s   GERD (gastroesophageal reflux disease)    Heart murmur    Hiatal hernia    History of adenomatous polyp of colon    History of kidney stones    History of melanoma 2008   s/p left forearn wide local excision 06/ 2008   History of syncope 2010   cardiologist --- dr Martinique;  (11-25-2021 per pt no syncope or near syncope since)   Hypertension    Macular degeneration of both eyes    Nocturia associated with benign prostatic hyperplasia    OSA (obstructive sleep apnea)    no cpap   Polyneuropathy    PVC's (premature ventricular contractions) 2010   06/ 2010 per event monitor SR w/ occasional PVCs and 4  beat SVT (in epic)   Wears glasses    Past Surgical History:  Procedure Laterality Date   APPENDECTOMY     child   CARDIAC CATHETERIZATION  10/2012   minimal nonobstructive CAD with normal LV function   CATARACT EXTRACTION W/ INTRAOCULAR LENS IMPLANT Bilateral    2009 approx   CYSTOSCOPY WITH LITHOLAPAXY N/A 07/13/2016   Procedure: CYSTOSCOPY WITH LITHOLAPAXY;  Surgeon: Franchot Gallo, MD;  Location: [facility];  Service: Urology;  Laterality: N/A;   CYSTOSCOPY WITH LITHOLAPAXY N/A  12/01/2021   Procedure: CYSTOSCOPY WITH LITHOLAPAXY;  Surgeon: Franchot Gallo, MD;  Location: [facility];  Service: Urology;  Laterality: N/A;   CYSTOSCOPY/RETROGRADE/URETEROSCOPY/STONE EXTRACTION WITH BASKET  02/24/2012   Procedure: CYSTOSCOPY/RETROGRADE/URETEROSCOPY/STONE EXTRACTION WITH BASKET;  Surgeon: Franchot Gallo, MD;  Location: [facility];  Service: Urology;  Laterality: Bilateral;  1 HOUR   EXTRACORPOREAL SHOCK WAVE LITHOTRIPSY  11/16/2011   @[facility]    GASTRECTOMY     1970s  for ulcers   HEMORRHOID SURGERY     yrs ago   HOLMIUM LASER APPLICATION N/A 2/77/8242   Procedure: HOLMIUM LASER APPLICATION;  Surgeon: Franchot Gallo, MD;  Location: [facility];  Service: Urology;  Laterality: N/A;   INGUINAL HERNIA REPAIR Left 07/13/2016   Procedure: REPAIR LEFT INGUINAL HERNIA;  Surgeon: Armandina Gemma, MD;  Location: [facility];  Service: General;  Laterality: Left;   INSERTION OF MESH Left 07/13/2016   Procedure: INSERTION OF MESH;  Surgeon: Armandina Gemma, MD;  Location: [facility];  Service: General;  Laterality: Left;   MELANOMA EXCISION Left 05/05/2007   @[facility]  ;   LEFT FOREARM   TONSILLECTOMY     child   TOTAL HIP ARTHROPLASTY Left 02/11/2021   Procedure: TOTAL HIP ARTHROPLASTY;  Surgeon: Marchia Bond, MD;  Location: [facility];  Service: Orthopedics;  Laterality: Left;   TOTAL SHOULDER ARTHROPLASTY Right 12/29/2017   Procedure: TOTAL SHOULDER ARTHROPLASTY;  Surgeon: Hiram Gash, MD;  Location: [facility];  Service: Orthopedics;  Laterality: Right;   Social History:  reports that he quit smoking about 65 years ago. His smoking use included cigarettes. He has a 0.40 pack-year smoking history. He has never used smokeless tobacco. He reports that he does not drink alcohol and does not use drugs.  Allergies  Allergen Reactions   Aspirin Other (See Comments)    UPSET STOMACH   Percocet [Oxycodone-Acetaminophen] Nausea And Vomiting   Vitamins  [Apatate] Other (See Comments)    Bothers stomach    Family History  Problem Relation Age of Onset   Heart disease Mother        CABG in her 83s   Pneumonia Father    Cancer Sister        breast   Lung cancer Brother        smoker    Prior to Admission medications   Medication Sig Start Date End Date Taking? Authorizing Provider  acetaminophen (TYLENOL) 500 MG tablet Take 500-1,000 mg by mouth 2 (two) times daily as needed for moderate pain or headache.   Yes [provider]  atorvastatin (LIPITOR) 20 MG tablet TAKE ONE TABLET BY MOUTH THREE TIMES A WEEK 12/18/21  Yes Marin Olp, MD  bevacizumab (AVASTIN) 2.5 mg/0.1 mL SOLN 2.5 mg by Intravitreal route. Injection to eyes every 5 weeks - use with vigamox   Yes [provider]  Carboxymeth-Glycerin-Polysorb (REFRESH OPTIVE ADVANCED OP) Place 1 drop into both eyes daily.   Yes [provider]  Carboxymethylcellulose Sodium 1 % GEL Place 1 drop into both eyes at bedtime.   Yes [provider]  ciprofloxacin (CIPRO) 250 MG tablet Take 1 tablet (250 mg total) by mouth every 12 (twelve) hours for 5 days. 01/01/22 01/06/22 Yes Badalamente, Rudell Cobb, PA-C  Cyanocobalamin (B-12 COMPLIANCE INJECTION IJ) Inject 1 Dose as directed every 30 (thirty) days.   Yes [provider]  losartan (COZAAR) 100 MG tablet Take one-half tablet by mouth twice daily 12/18/21  Yes Marin Olp, MD  metoCLOPramide (REGLAN) 5 MG tablet Take 5 mg by mouth 2 (two) times daily.   Yes [provider]  metoprolol tartrate (LOPRESSOR) 25 MG tablet Take 0.5 tablets (12.5 mg total) by mouth 2 (two) times daily. 12/18/21  Yes Martinique, Peter M, MD  ondansetron (ZOFRAN-ODT) 4 MG disintegrating tablet Take 1 tablet (4 mg total) by mouth every 8 (eight) hours as needed for nausea or vomiting. 01/01/22  Yes Loni Beckwith, PA-C  pantoprazole (PROTONIX) 40 MG tablet Take 40 mg by mouth 2 (two) times daily.   Yes [provider]  polyethylene glycol (MIRALAX / GLYCOLAX) 17 g packet Take 17 g by mouth daily as needed for moderate constipation.   Yes [provider]  tamsulosin (FLOMAX) 0.4 MG CAPS capsule Take 0.4 mg by mouth daily. 12/30/21  Yes [provider]  triamcinolone cream (KENALOG) 0.1 % Apply 1 application topically 2 (two) times daily. 12/15/21  Yes [provider]  VIGAMOX 0.5 % ophthalmic solution Place 1 drop into both eyes as directed. Take every 5 weeks as directed - use with avastin, instill 1 drop into both eyes 4 times daily for 2 days after eye injections 05/28/14  Yes [provider]  sennosides-docusate sodium (SENOKOT-S) 8.6-50 MG tablet Take 2 tablets by mouth daily. 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   Results are as per HPI note  EKG my over read shows A-fib with PVC rate is controlled  Assessment and Plan:  Assessment and Plan: * Hematemesis with nausea?  Mallory-Weiss tear?  Precipitant antibiotics causing nausea- (present on admission) Keep n.p.o. GTT Protonix, fluids 125 cc/H Trend hemoglobin every 12 today-as  hemodynamically stable but with possibility of decompensation will send to progressive-does not need stepdown just yet but will watch closely  Nephrolithiasis Recent complicated urological history Is on day 3 of ciprofloxacin from emergency room visit-transition to ceftriaxone 1 g every 24 for 2 days until E. coli sensitivities return Dr. Diona Fanti to be made aware on Monday  if patient is still here regarding issues with continued nephrolithiasis given recent surgery in January 2023 No current indication for urology involvement  PVC's (premature ventricular contractions)- (present on admission)    History of melanoma- arm Requires outpatient follow-up  Obstructive sleep apnea- (present on admission) Unclear if taking or using CPAP  New onset a-fib ([place]) On monitors ICD possible A-fib EKG confirms that In a.m. Will need echo Cannot anticoagulate obviously at this time given risk of GI bleed will involve cardiology for prognostication as CHADS2 score is probably >4 Has bled score is elevated above 3   BPH (benign prostatic hyperplasia)- (present on admission) Continue Flomax and follow closely with Dr. Diona Fanti  CAD (coronary artery disease)- (present on admission) Continue statin losartan etc.-last cath showed nonobstructive disease so medical management imperative      Advance Care Planning:   Code Status: Full Code   Consults: Cardiology Gastroenterology  Family Communication: None present at bedside  Severity of Illness: The appropriate patient status for this patient is INPATIENT. Inpatient status is judged to be reasonable and necessary in order to provide the required intensity of service to ensure the patient's safety. The patient's presenting symptoms, physical exam findings, and initial radiographic and laboratory data in the context of their chronic comorbidities is felt to place them at high risk for further clinical deterioration. Furthermore, it is not  anticipated that the patient will be medically stable for discharge from the hospital within 2 midnights of admission.   * I certify that at the point of admission it is my clinical judgment that the patient will require inpatient hospital care spanning beyond 2 midnights from the point of admission due to high intensity of service, high risk for further deterioration and high frequency of surveillance required.*  Author: Nita Sells, MD 01/03/2022 5:49 PM  For on call review [email or web address].

## 2022-01-03 NOTE — ED Provider Notes (Signed)
[facility] DEPT Provider Note   CSN: [id number] Arrival date & time: 01/03/22  1020     History  Chief Complaint  Patient presents with   GI Bleeding    Joseph Simpson is a 87 y.o. male.  HPI  87 year old male with a history of CAD, melanoma, HTN, GERD, nephrolithiasis, macular degeneration, BPH, squamous cell carcinoma, HLD, OSA, recent emergency department visit with concern for urinary tract infection and AKI who presents to the emergency department with nausea, vomiting, persistent suprapubic pain, dysuria, new coffee-ground emesis.  The patient was seen in the emergency department on 01/01/2022 with a primary complaint of constipation, abdominal discomfort with recent stones removed from his bladder.  He had had mild dysuria and was found to have urinary tract infection on labs with an associated AKI.  A CT abdomen pelvis was performed without contrast which revealed nonobstructive renal stone with no evidence of bowel perforation or small bowel obstruction.  He was prescribed ciprofloxacin for urinary tract infection.  He had noted hematochezia after taking constipation meds.  He was advised to follow-up with GI regarding constipation and bloody stools.  His hemoglobin was notably stable.  On review of his urine culture from this visit, he did grew out greater than 100,000 colony-forming units of E. coli.  No susceptibilities as of yet.  The patient states that he has had persistent suprapubic discomfort, nausea, vomiting initially stomach contents but now coffee grounds noted in his vomitus.  He has been unable to tolerate any oral medications and has not been able to take his outpatient prescribed antibiotic.  He denies any fevers or chills.  He states that he still has yet to have had a bowel movement over the last week.  He is not sure when he last passed gas.  He endorses persistent dysuria.  He feels dehydrated and cannot tolerate oral liquids or  solids.  Home Medications Prior to Admission medications   Medication Sig Start Date End Date Taking? Authorizing Provider  acetaminophen (TYLENOL) 500 MG tablet Take 500 mg by mouth 2 (two) times daily as needed for moderate pain or headache.    [provider]  atorvastatin (LIPITOR) 20 MG tablet TAKE ONE TABLET BY MOUTH THREE TIMES A WEEK 12/18/21   Marin Olp, MD  bevacizumab (AVASTIN) 2.5 mg/0.1 mL SOLN 2.5 mg by Intravitreal route. Injection to eyes every 5 weeks - use with vigamox    [provider]  Carboxymeth-Glycerin-Polysorb (REFRESH OPTIVE ADVANCED OP) Place 1 drop into both eyes daily.    [provider]  Carboxymethylcellulose Sodium 1 % GEL Place 1 drop into both eyes at bedtime.    [provider]  ciprofloxacin (CIPRO) 250 MG tablet Take 1 tablet (250 mg total) by mouth every 12 (twelve) hours for 5 days. 01/01/22 01/06/22  Loni Beckwith, PA-C  Cyanocobalamin (B-12 COMPLIANCE INJECTION IJ) Inject 1 Dose as directed every 30 (thirty) days.    [provider]  losartan (COZAAR) 100 MG tablet Take one-half tablet by mouth twice daily 12/18/21   Marin Olp, MD  metoCLOPramide (REGLAN) 5 MG tablet Take 5 mg by mouth 2 (two) times daily.    [provider]  metoprolol tartrate (LOPRESSOR) 25 MG tablet Take 0.5 tablets (12.5 mg total) by mouth 2 (two) times daily. 12/18/21   Martinique, Peter M, MD  ondansetron (ZOFRAN-ODT) 4 MG disintegrating tablet Take 1 tablet (4 mg total) by mouth every 8 (eight) hours as needed for  nausea or vomiting. 01/01/22   Loni Beckwith, PA-C  pantoprazole (PROTONIX) 40 MG tablet Take 40 mg by mouth 2 (two) times daily.    [provider]  polyethylene glycol (MIRALAX / GLYCOLAX) 17 g packet Take 17 g by mouth daily as needed for moderate constipation.    [provider]  sennosides-docusate sodium (SENOKOT-S) 8.6-50 MG tablet Take 2 tablets by mouth daily. Patient taking  differently: Take 2 tablets by mouth as needed. 02/12/21   Merlene Pulling K, PA-C  VIGAMOX 0.5 % ophthalmic solution Place 1 drop into both eyes as directed. Take every 5 weeks as directed - use with avastin, instill 1 drop into both eyes 4 times daily for 2 days after eye injections 05/28/14   [provider]      Allergies    Aspirin, Percocet [oxycodone-acetaminophen], and Vitamins [apatate]    Review of Systems   Review of Systems  All other systems reviewed and are negative.  Physical Exam Updated Vital Signs BP 130/81    Pulse 88    Temp 98.4 F (36.9 C) (Oral)    Resp 18    Ht 6' (1.829 m)    Wt 77.1 kg    SpO2 96%    BMI 23.06 kg/m  Physical Exam Vitals and nursing note reviewed. Exam conducted with a chaperone present.  Constitutional:      General: He is not in acute distress. HENT:     Head: Normocephalic and atraumatic.     Mouth/Throat:     Mouth: Mucous membranes are dry.  Eyes:     Conjunctiva/sclera: Conjunctivae normal.     Pupils: Pupils are equal, round, and reactive to light.  Cardiovascular:     Rate and Rhythm: Normal rate. Rhythm irregular.     Pulses: Normal pulses.  Pulmonary:     Effort: Pulmonary effort is normal. No respiratory distress.  Abdominal:     General: There is no distension.     Tenderness: There is abdominal tenderness. There is no guarding.     Comments: Suprapubic tenderness to palpation, no rebound or guarding  Genitourinary:    Rectum: Guaiac result negative. No tenderness.     Comments: No melena or hematochezia, fecal occult negative Musculoskeletal:        General: No deformity or signs of injury.     Cervical back: Neck supple.     Right lower leg: No edema.     Left lower leg: No edema.  Skin:    Findings: No lesion or rash.  Neurological:     General: No focal deficit present.     Mental Status: He is alert. Mental status is at baseline.    ED Results / Procedures / Treatments   Labs (all labs ordered are  listed, but only abnormal results are displayed) Labs Reviewed  URINALYSIS, ROUTINE W REFLEX MICROSCOPIC - Abnormal; Notable for the following components:      Result Value   APPearance CLOUDY (*)    Hgb urine dipstick MODERATE (*)    Ketones, ur 5 (*)    Protein, ur 30 (*)    Nitrite POSITIVE (*)    Leukocytes,Ua LARGE (*)    WBC, UA >50 (*)    Bacteria, UA MANY (*)    All other components within normal limits  RESP PANEL BY RT-PCR (FLU A&B, COVID) [id number]  LACTIC ACID, PLASMA  COMPREHENSIVE METABOLIC PANEL  LIPASE, BLOOD  CBC WITH DIFFERENTIAL/PLATELET  POC OCCULT BLOOD, ED  EKG EKG Interpretation  Date/Time:  Saturday January 03 2022 12:02:18 EST Ventricular Rate:  84 PR Interval:    QRS Duration: 96 QT Interval:  354 QTC Calculation: 419 R Axis:   75 Text Interpretation: Atrial fibrillation  poor baseline in leads I and II Confirmed by Regan Lemming ([id number]) on 01/03/2022 12:19:31 PM  Radiology DG Chest Portable 1 View  Result Date: 01/03/2022 CLINICAL DATA:  Coffee-ground emesis. EXAM: PORTABLE CHEST 1 VIEW COMPARISON:  02/21/2014 and prior chest radiographs FINDINGS: The cardiomediastinal silhouette is unremarkable. There is no evidence of focal airspace disease, pulmonary edema, suspicious pulmonary nodule/mass, pleural effusion, or pneumothorax. No acute bony abnormalities are identified. RIGHT shoulder arthroplasty surgical hardware noted. Surgical clips overlying the UPPER abdomen are again noted. IMPRESSION: No active disease. Electronically Signed   By: Margarette Canada M.D.   On: 01/03/2022 13:52    Procedures Procedures    Medications Ordered in ED Medications  sodium chloride 0.9 % bolus 1,000 mL (1,000 mLs Intravenous New Bag/Given 01/03/22 1220)    And  0.9 %  sodium chloride infusion (has no administration in time range)  fentaNYL (SUBLIMAZE) injection 50 mcg (0 mcg Intravenous Hold 01/03/22 1220)  ondansetron (ZOFRAN) injection 4 mg (4 mg Intravenous Given  01/03/22 1220)  pantoprozole (PROTONIX) 80 mg /NS 100 mL infusion (8 mg/hr Intravenous New Bag/Given 01/03/22 1355)  pantoprazole (PROTONIX) injection 40 mg (has no administration in time range)  LORazepam (ATIVAN) tablet 0.5 mg (has no administration in time range)  cefTRIAXone (ROCEPHIN) 1 g in sodium chloride 0.9 % 100 mL IVPB (1 g Intravenous New Bag/Given 01/03/22 1221)  pantoprazole (PROTONIX) 80 mg /NS 100 mL IVPB (80 mg Intravenous New Bag/Given 01/03/22 1314)    ED Course/ Medical Decision Making/ A&P                           Medical Decision Making Amount and/or Complexity of Data Reviewed Labs: ordered. Radiology: ordered.  Risk Prescription drug management.   87 year old male with a history of CAD, melanoma, HTN, GERD, nephrolithiasis, macular degeneration, BPH, squamous cell carcinoma, HLD, OSA, recent emergency department visit with concern for urinary tract infection and AKI who presents to the emergency department with nausea, vomiting, persistent suprapubic pain, dysuria, new coffee-ground emesis.  The patient was seen in the emergency department on 01/01/2022 with a primary complaint of constipation, abdominal discomfort with recent stones removed from his bladder.  He had had mild dysuria and was found to have urinary tract infection on labs with an associated AKI.  A CT abdomen pelvis was performed without contrast which revealed nonobstructive renal stone with no evidence of bowel perforation or small bowel obstruction.  He was prescribed ciprofloxacin for urinary tract infection.  He had noted hematochezia after taking constipation meds.  He was advised to follow-up with GI regarding constipation and bloody stools.  His hemoglobin was notably stable.  On review of his urine culture from this visit, he did grew out greater than 100,000 colony-forming units of E. coli.  No susceptibilities as of yet.  The patient states that he has had persistent suprapubic discomfort, nausea,  vomiting initially stomach contents but now coffee grounds noted in his vomitus.  He has been unable to tolerate any oral medications and has not been able to take his outpatient prescribed antibiotic.  He denies any fevers or chills.  He states that he still has yet to have had a bowel movement over the last  week.  He is not sure when he last passed gas.  He endorses persistent dysuria.  He feels dehydrated and cannot tolerate oral liquids or solids.  On arrival, the patient was afebrile, hemodynamically stable, mildly hypertensive BP 159/95, saturating 99% on room air.  Atrial fibrillation noted on cardiac telemetry with no evidence of RVR.  Patient presenting with multiple days of NBNB emesis and suprapubic pain in the setting of untreated urinary tract infection.  No flank pain and no CVA tenderness.  The patient is not septic on arrival. IV access was obtained and the patient was administered an IV fluid bolus and IV Rocephin to cover for E. coli urinary tract infection which grew out on urine culture.  Given the patient's abdominal pain, lack of bowel movement, concern for UTI/developing pyelonephritis, possible concern for small bowel obstruction, other acute intra-abdominal abnormality.  Will obtain screening labs and reassess with a plan for likely admission given the patient's failure of outpatient antibiotic treatment of his urinary tract infection, concern for dehydration with need for IV fluid rehydration.  Due to the patient's coffee-ground emesis, I discussed the patient with Dr. Watt Climes of [facility] gastroenterology who will see the patient in consultation.  Plan at time of signout to follow-up the patient CT abdomen pelvis and remainder of the patient's labs and reassess the patient.  Plan for likely admission.  Signout given to Dr. Roderic Palau at 1500.    Final Clinical Impression(s) / ED Diagnoses Final diagnoses:  Nausea and vomiting, unspecified vomiting type  Acute cystitis with hematuria   Coffee ground emesis    Rx / DC Orders ED Discharge Orders     None         Regan Lemming, MD 01/03/22 1454

## 2022-01-04 ENCOUNTER — Encounter (HOSPITAL_COMMUNITY): Payer: Self-pay | Admitting: Family Medicine

## 2022-01-04 DIAGNOSIS — I480 Paroxysmal atrial fibrillation: Secondary | ICD-10-CM

## 2022-01-04 DIAGNOSIS — K920 Hematemesis: Secondary | ICD-10-CM | POA: Diagnosis not present

## 2022-01-04 LAB — CBC WITH DIFFERENTIAL/PLATELET
Abs Immature Granulocytes: 0.05 10*3/uL (ref 0.00–0.07)
Basophils Absolute: 0 10*3/uL (ref 0.0–0.1)
Basophils Relative: 0 %
Eosinophils Absolute: 0 10*3/uL (ref 0.0–0.5)
Eosinophils Relative: 0 %
HCT: 45.7 % (ref 39.0–52.0)
Hemoglobin: 14.9 g/dL (ref 13.0–17.0)
Immature Granulocytes: 0 %
Lymphocytes Relative: 7 %
Lymphs Abs: 0.8 10*3/uL (ref 0.7–4.0)
MCH: 31.2 pg (ref 26.0–34.0)
MCHC: 32.6 g/dL (ref 30.0–36.0)
MCV: 95.6 fL (ref 80.0–100.0)
Monocytes Absolute: 0.5 10*3/uL (ref 0.1–1.0)
Monocytes Relative: 5 %
Neutro Abs: 10.2 10*3/uL — ABNORMAL HIGH (ref 1.7–7.7)
Neutrophils Relative %: 88 %
Platelets: 235 10*3/uL (ref 150–400)
RBC: 4.78 MIL/uL (ref 4.22–5.81)
RDW: 12.1 % (ref 11.5–15.5)
WBC: 11.7 10*3/uL — ABNORMAL HIGH (ref 4.0–10.5)
nRBC: 0 % (ref 0.0–0.2)

## 2022-01-04 LAB — URINE CULTURE: Culture: >=100000 — AB

## 2022-01-04 LAB — PROTIME-INR
INR: 1.2 (ref 0.8–1.2)
Prothrombin Time: 14.8 seconds (ref 11.4–15.2)

## 2022-01-04 LAB — COMPREHENSIVE METABOLIC PANEL
ALT: 14 U/L (ref 0–44)
AST: 16 U/L (ref 15–41)
Albumin: 3.8 g/dL (ref 3.5–5.0)
Alkaline Phosphatase: 59 U/L (ref 38–126)
Anion gap: 10 (ref 5–15)
BUN: 23 mg/dL (ref 8–23)
CO2: 26 mmol/L (ref 22–32)
Calcium: 9 mg/dL (ref 8.9–10.3)
Chloride: 103 mmol/L (ref 98–111)
Creatinine, Ser: 0.84 mg/dL (ref 0.61–1.24)
GFR, Estimated: >60 mL/min (ref >60)
Glucose, Bld: 117 mg/dL — ABNORMAL HIGH (ref 70–99)
Potassium: 3.8 mmol/L (ref 3.5–5.1)
Sodium: 139 mmol/L (ref 135–145)
Total Bilirubin: 0.6 mg/dL (ref 0.3–1.2)
Total Protein: 7.5 g/dL (ref 6.5–8.1)

## 2022-01-04 MED ORDER — METOCLOPRAMIDE HCL 5 MG/ML IJ SOLN
5.0000 mg | Freq: Three times a day (TID) | INTRAMUSCULAR | Status: DC
  Administered 2022-01-04 – 2022-01-05 (×3): 5 mg via INTRAVENOUS
  Filled 2022-01-04 (×3): qty 2

## 2022-01-04 MED ORDER — CHLORHEXIDINE GLUCONATE CLOTH 2 % EX PADS
6.0000 | MEDICATED_PAD | Freq: Every day | CUTANEOUS | Status: DC
  Administered 2022-01-04 – 2022-01-08 (×4): 6 via TOPICAL

## 2022-01-04 MED ORDER — POLYVINYL ALCOHOL 1.4 % OP SOLN
1.0000 [drp] | Freq: Every day | OPHTHALMIC | Status: DC
  Administered 2022-01-04 – 2022-01-08 (×5): 1 [drp] via OPHTHALMIC
  Filled 2022-01-04: qty 15

## 2022-01-04 MED ORDER — MELATONIN 5 MG PO TABS
5.0000 mg | ORAL_TABLET | Freq: Once | ORAL | Status: AC
  Administered 2022-01-04: 5 mg via ORAL
  Filled 2022-01-04: qty 1

## 2022-01-04 MED ORDER — LOSARTAN POTASSIUM 25 MG PO TABS
12.5000 mg | ORAL_TABLET | Freq: Every day | ORAL | Status: DC
  Administered 2022-01-05 – 2022-01-09 (×4): 12.5 mg via ORAL
  Filled 2022-01-04 (×6): qty 1

## 2022-01-04 MED ORDER — GATIFLOXACIN 0.5 % OP SOLN: 1.0000 [drp] | Freq: Four times a day (QID) | OPHTHALMIC | Status: DC

## 2022-01-04 MED ORDER — SODIUM CHLORIDE 0.9 % IV SOLN
1.0000 g | Freq: Three times a day (TID) | INTRAVENOUS | Status: AC
  Administered 2022-01-04 – 2022-01-08 (×14): 1 g via INTRAVENOUS
  Filled 2022-01-04 (×2): qty 20
  Filled 2022-01-04: qty 1
  Filled 2022-01-04: qty 20
  Filled 2022-01-04: qty 1
  Filled 2022-01-04: qty 20
  Filled 2022-01-04: qty 1
  Filled 2022-01-04 (×2): qty 20
  Filled 2022-01-04 (×2): qty 1
  Filled 2022-01-04 (×3): qty 20

## 2022-01-04 MED ORDER — METOPROLOL TARTRATE 25 MG PO TABS
25.0000 mg | ORAL_TABLET | Freq: Two times a day (BID) | ORAL | Status: DC
  Administered 2022-01-04 – 2022-01-09 (×9): 25 mg via ORAL
  Filled 2022-01-04 (×11): qty 1

## 2022-01-04 MED ORDER — PROCHLORPERAZINE EDISYLATE 10 MG/2ML IJ SOLN
10.0000 mg | Freq: Four times a day (QID) | INTRAMUSCULAR | Status: DC | PRN
  Administered 2022-01-04 – 2022-01-06 (×6): 10 mg via INTRAVENOUS
  Filled 2022-01-04 (×6): qty 2

## 2022-01-04 NOTE — Plan of Care (Signed)

## 2022-01-04 NOTE — Consult Note (Signed)
Cardiology Consult note:   Patient ID: Joseph Simpson MRN: [id number]; DOB: [birth year]   Admission date: 01/03/2022  PCP:  Marin Olp, MD   [facility] Providers Cardiologist:  Peter Martinique, MD       Chief Complaint:  AF  Patient Profile:   Joseph Simpson is a 87 y.o. male with HTN, PVCs, OSA notn on CPA, non-obstructive CAD by 2013 [facility] previous amiodarone in the past (distant past for PVCs) who is being seen 01/04/2022 for the evaluation of AF in the setting of anemia.  History of Present Illness:   Joseph Simpson is a fairly active gentleman.  Has of last cardiologist visit was able to live independently and do al ADLs.  Recently has had new episodes of hematochezia with conservative mgmt planned (1/18, 2/16).  Presented this hospitalization with coffee ground emesis.  Patient notes that he thinks he had AF before.  Notes that in the pass he had palpitations.  He has not have palpitations for several years.  Has had no chest pain, chest pressure, chest tightness, chest stinging . In 2020 was still active and able to do all ALDs. No shortness of breath, DOE .  No PND or orthopnea.  No bendopnea, weight gain, leg swelling , or abdominal swelling.  No syncope or near syncope .    Notes  no palpitations or funny heart beats (presently in AF)  He notes that he cannot tolerate aspirin- he has ha dprior GI work ups and has prior gastrectomy for ulcers.  Now ASA and certain vitamins lead to hip to cough up blood.   Past Medical History:  Diagnosis Date   Arthritis    B12 deficiency    Bladder calculi    CAD (coronary artery disease)    cardiologist--- dr Martinique;   abnormal nuclear stress test 11/ 2013;  s/p cardiac cath 10-21-2012 minimal nonobstructiive CAD w/ normal LVF   Dyslipidemia    Full dentures    Gastroparesis    followed by [name] PA ( [facility] in [place]) s/p gastrectomy for ulcers in 1970s   GERD (gastroesophageal reflux disease)    Heart murmur     Hiatal hernia    History of adenomatous polyp of colon    History of kidney stones    History of melanoma 2008   s/p left forearn wide local excision 06/ 2008   History of syncope 2010   cardiologist --- dr Martinique;  (11-25-2021 per pt no syncope or near syncope since)   Hypertension    Macular degeneration of both eyes    Nocturia associated with benign prostatic hyperplasia    OSA (obstructive sleep apnea)    no cpap   Polyneuropathy    PVC's (premature ventricular contractions) 2010   06/ 2010 per event monitor SR w/ occasional PVCs and 4 beat SVT (in epic)   Wears glasses     Past Surgical History:  Procedure Laterality Date   APPENDECTOMY     child   CARDIAC CATHETERIZATION  10/2012   minimal nonobstructive CAD with normal LV function   CATARACT EXTRACTION W/ INTRAOCULAR LENS IMPLANT Bilateral    2009 approx   CYSTOSCOPY WITH LITHOLAPAXY N/A 07/13/2016   Procedure: CYSTOSCOPY WITH LITHOLAPAXY;  Surgeon: Franchot Gallo, MD;  Location: [facility];  Service: Urology;  Laterality: N/A;   CYSTOSCOPY WITH LITHOLAPAXY N/A 12/01/2021   Procedure: CYSTOSCOPY WITH LITHOLAPAXY;  Surgeon: Franchot Gallo, MD;  Location: [facility];  Service: Urology;  Laterality:  N/A;   CYSTOSCOPY/RETROGRADE/URETEROSCOPY/STONE EXTRACTION WITH BASKET  02/24/2012   Procedure: CYSTOSCOPY/RETROGRADE/URETEROSCOPY/STONE EXTRACTION WITH BASKET;  Surgeon: Franchot Gallo, MD;  Location[facility];  Service: Urology;  Laterality: Bilateral;  1 HOUR   EXTRACORPOREAL SHOCK WAVE LITHOTRIPSY  11/16/2011   @[facility]    GASTRECTOMY     1970s  for ulcers   HEMORRHOID SURGERY     yrs ago   HOLMIUM LASER APPLICATION N/A 1/61/0960   Procedure: HOLMIUM LASER APPLICATION;  Surgeon: Franchot Gallo, MD;  Location[facility];  Service: Urology;  Laterality: N/A;   INGUINAL HERNIA REPAIR Left 07/13/2016   Procedure: REPAIR LEFT INGUINAL HERNIA;  Surgeon: Armandina Gemma, MD;   Location: [facility];  Service: General;  Laterality: Left;   INSERTION OF MESH Left 07/13/2016   Procedure: INSERTION OF MESH;  Surgeon: Armandina Gemma, MD;  Location: [facility];  Service: General;  Laterality: Left;   MELANOMA EXCISION Left 05/05/2007   @[facility]  ;   LEFT FOREARM   TONSILLECTOMY     child   TOTAL HIP ARTHROPLASTY Left 02/11/2021   Procedure: TOTAL HIP ARTHROPLASTY;  Surgeon: Marchia Bond, MD;  Location: [facility];  Service: Orthopedics;  Laterality: Left;   TOTAL SHOULDER ARTHROPLASTY Right 12/29/2017   Procedure: TOTAL SHOULDER ARTHROPLASTY;  Surgeon: Hiram Gash, MD;  Location: [facility];  Service: Orthopedics;  Laterality: Right;     Medications Prior to Admission: Prior to Admission medications   Medication Sig Start Date End Date Taking? Authorizing Provider  acetaminophen (TYLENOL) 500 MG tablet Take 500-1,000 mg by mouth 2 (two) times daily as needed for moderate pain or headache.   Yes [provider]  atorvastatin (LIPITOR) 20 MG tablet TAKE ONE TABLET BY MOUTH THREE TIMES A WEEK 12/18/21  Yes Marin Olp, MD  bevacizumab (AVASTIN) 2.5 mg/0.1 mL SOLN 2.5 mg by Intravitreal route. Injection to eyes every 5 weeks - use with vigamox   Yes [provider]  Carboxymeth-Glycerin-Polysorb (REFRESH OPTIVE ADVANCED OP) Place 1 drop into both eyes daily.   Yes [provider]  Carboxymethylcellulose Sodium 1 % GEL Place 1 drop into both eyes at bedtime.   Yes [provider]  ciprofloxacin (CIPRO) 250 MG tablet Take 1 tablet (250 mg total) by mouth every 12 (twelve) hours for 5 days. 01/01/22 01/06/22 Yes Badalamente, Rudell Cobb, PA-C  Cyanocobalamin (B-12 COMPLIANCE INJECTION IJ) Inject 1 Dose as directed every 30 (thirty) days.   Yes [provider]  losartan (COZAAR) 100 MG tablet Take one-half tablet by mouth twice daily 12/18/21  Yes Marin Olp, MD  metoCLOPramide (REGLAN) 5 MG tablet Take 5 mg by mouth 2 (two) times daily.   Yes  [provider]  metoprolol tartrate (LOPRESSOR) 25 MG tablet Take 0.5 tablets (12.5 mg total) by mouth 2 (two) times daily. 12/18/21  Yes Martinique, Peter M, MD  ondansetron (ZOFRAN-ODT) 4 MG disintegrating tablet Take 1 tablet (4 mg total) by mouth every 8 (eight) hours as needed for nausea or vomiting. 01/01/22  Yes Loni Beckwith, PA-C  pantoprazole (PROTONIX) 40 MG tablet Take 40 mg by mouth 2 (two) times daily.   Yes [provider]  polyethylene glycol (MIRALAX / GLYCOLAX) 17 g packet Take 17 g by mouth daily as needed for moderate constipation.   Yes [provider]  tamsulosin (FLOMAX) 0.4 MG CAPS capsule Take 0.4 mg by mouth daily. 12/30/21  Yes [provider]  triamcinolone cream (KENALOG) 0.1 % Apply 1 application topically  2 (two) times daily. 12/15/21  Yes [provider]  VIGAMOX 0.5 % ophthalmic solution Place 1 drop into both eyes as directed. Take every 5 weeks as directed - use with avastin, instill 1 drop into both eyes 4 times daily for 2 days after eye injections 05/28/14  Yes [provider]  sennosides-docusate sodium (SENOKOT-S) 8.6-50 MG tablet Take 2 tablets by mouth daily. Patient not taking: Reported on 01/03/2022 02/12/21   Ventura Bruns, PA-C     Allergies:    Allergies  Allergen Reactions   Aspirin Other (See Comments)    UPSET STOMACH   Percocet [Oxycodone-Acetaminophen] Nausea And Vomiting   Vitamins [Apatate] Other (See Comments)    Bothers stomach    Social History:   Social History   Socioeconomic History   Marital status: Widowed    Spouse name: Not on file   Number of children: 1   Years of education: Not on file   Highest education level: Not on file  Occupational History   Occupation: retired  Tobacco Use   Smoking status: Former    Packs/day: 0.10    Years: 4.00    Pack years: 0.40    Types: Cigarettes    Quit date: 09/03/1956    Years since quitting: 65.3   Smokeless tobacco: Never   Vaping Use   Vaping Use: Never used  Substance and Sexual Activity   Alcohol use: No    Alcohol/week: 0.0 standard drinks   Drug use: Never   Sexual activity: Yes  Other Topics Concern   Not on file  Social History Narrative   Widowed (lost wife 2015 from lung cancer never smoker), 1 son, 2 grandkids, 1 greatgrandkid (born on Christmas)      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       Hobbies: ride motorcycle (slingshot 3 year), rabbit and dear hunt   Social Determinants of Radio broadcast assistant Strain: Low Risk    Difficulty of Paying Living Expenses: Not hard at all  Food Insecurity: No Food Insecurity   Worried About Charity fundraiser in the Last Year: Never true   Arboriculturist in the Last Year: Never true  Transportation Needs: No Transportation Needs   Lack of Transportation (Medical): No   Lack of Transportation (Non-Medical): No  Physical Activity: Sufficiently Active   Days of Exercise per Week: 5 days   Minutes of Exercise per Session: 30 min  Stress: No Stress Concern Present   Feeling of Stress : Not at all  Social Connections: Moderately Isolated   Frequency of Communication with Friends and Family: More than three times a week   Frequency of Social Gatherings with Friends and Family: More than three times a week   Attends Religious Services: More than 4 times per year   Active Member of [employer] or Organizations: No   Attends Archivist Meetings: Never   Marital Status: Widowed  Human resources officer Violence: Not At Risk   Fear of Current or Ex-Partner: No   Emotionally Abused: No   Physically Abused: No   Sexually Abused: No    Family History:   The patient's family history includes Cancer in his sister; Heart disease in his mother; Lung cancer in his brother; Pneumonia in his father.    ROS:  Please see the history of present illness.  All other ROS reviewed and negative.     Physical Exam/Data:   Vitals:  01/03/22  2011 01/04/22 0149 01/04/22 0519 01/04/22 0757  BP: (!) 164/96 (!) 166/96 (!) 168/93 (!) 159/70  Pulse: 81 (!) 104 88 87  Resp: 18 19 19 18   Temp: 97.7 F (36.5 C) 97.6 F (36.4 C) 98.7 F (37.1 C) 98.2 F (36.8 C)  TempSrc: Oral Oral Oral Oral  SpO2: 97% 99% 98% 98%  Weight:      Height:        Intake/Output Summary (Last 24 hours) at 01/04/2022 0856 Last data filed at 01/04/2022 0800 Gross per 24 hour  Intake 1777.36 ml  Output 1170 ml  Net 607.36 ml   Last 3 Weights 01/03/2022 12/23/2021 12/03/2021  Weight (lbs) 170 lb 171 lb 174 lb  Weight (kg) 77.111 kg 77.565 kg 78.926 kg     Body mass index is 23.06 kg/m.   Gen: no distress, elderly male  Neck: No JVD, no carotid bruit Ears: bilateral Pilar Plate Sign Cardiac: No Rubs or Gallops, systolic crescendo Murmur, IRIR +2 radial pulses Respiratory: Clear to auscultation bilaterally, normal effort, normal  respiratory rate GI: Soft, nontender, non-distended  MS: No  edema;  moves all extremities Integument: Skin feels warm Neuro:  At time of evaluation, alert and oriented to person/place/time/situation  Psych: Normal affect, patient feels poorly- has not slept in 24 hours  EKG:  The ECG that was done  was personally reviewed and demonstrates atrial fibrillation rate 84 LHV with secondary repolarization No prior AF in 04/2018-0/22 EKD  Relevant CV Studies: Echo in 2010 from report normal LV function and left atrial size.  Laboratory Data:  High Sensitivity Troponin:  No results for input(s): TROPONINIHS in the last 720 hours.    Chemistry Recent Labs  Lab 01/03/22 1048 01/04/22 0355  NA 138 139  K 4.7 3.8  CL 99 103  CO2 29 26  GLUCOSE 134* 117*  BUN 31* 23  CREATININE 1.07 0.84  CALCIUM 9.1 9.0  GFRNONAA >60 >60  ANIONGAP 10 10    Recent Labs  Lab 01/03/22 1048 01/04/22 0355  PROT 8.1 7.5  ALBUMIN 4.0 3.8  AST 28 16  ALT 18 14  ALKPHOS 61 59  BILITOT 1.3* 0.6   Lipids No results for input(s): CHOL, TRIG,  HDL, LABVLDL, LDLCALC, CHOLHDL in the last 168 hours. Hematology Recent Labs  Lab 01/03/22 1048 01/03/22 1912 01/04/22 0355  WBC 8.9  --  11.7*  RBC 4.61  --  4.78  HGB 15.0 14.6 14.9  HCT 44.8 43.5 45.7  MCV 97.2  --  95.6  MCH 32.5  --  31.2  MCHC 33.5  --  32.6  RDW 12.1  --  12.1  PLT 216  --  235   Thyroid No results for input(s): TSH, FREET4 in the last 168 hours. BNPNo results for input(s): BNP, PROBNP in the last 168 hours.  DDimer No results for input(s): DDIMER in the last 168 hours.   Radiology/Studies:  CT ABDOMEN PELVIS W CONTRAST  Result Date: 01/03/2022 CLINICAL DATA:  Acute abdominal pain, nausea vomiting. EXAM: CT ABDOMEN AND PELVIS WITH CONTRAST TECHNIQUE: Multidetector CT imaging of the abdomen and pelvis was performed using the standard protocol following bolus administration of intravenous contrast. RADIATION DOSE REDUCTION: This exam was performed according to the departmental dose-optimization program which includes automated exposure control, adjustment of the mA and/or kV according to patient size and/or use of iterative reconstruction technique. CONTRAST:  114mL OMNIPAQUE IOHEXOL 300 MG/ML  SOLN COMPARISON:  None. FINDINGS: Lower chest: No acute  abnormality. Subsegmental atelectasis and fine subpleural reticulation at the lung bases, right greater than left. Hepatobiliary: No focal liver abnormality is seen. No gallstones, gallbladder wall thickening, or biliary dilatation. Pancreas: Diffuse moderate parenchymal volume loss and fatty infiltration. No pancreatic ductal dilatation or surrounding inflammatory changes. Spleen: Normal in size without focal abnormality. Adrenals/Urinary Tract: Adrenal glands are unremarkable. Multiple subcentimeter bilateral simple cortical renal cysts. Nonobstructive calyceal stones are noted bilaterally measuring up to 0.5 cm at the lower pole of the right kidney (series 2, image 28). No hydronephrosis or perinephric fat stranding. Mild  dilatation of the right ureter, slightly increased compared to 01/01/2022, with subtle mucosal hyperenhancement (series 4, images 72-77). There is an abrupt caliber change of the ureter as it crosses the iliac vessels (series 2, images 53-59). The urinary bladder is mildly distended with a few tiny locules of air within the lumen. Multiple bladder diverticuli are noted. Stomach/Bowel: Postoperative changes at the gastroesophageal junction. Distal gastrectomy with gastrojejunostomy. Diverticulosis coli. No evidence of bowel wall thickening, obstruction, or other inflammatory changes. Vascular/Lymphatic: Aortic atherosclerosis. No enlarged abdominal or pelvic lymph nodes. Reproductive: Enlarged prostate extends into the inferior aspect of the urinary bladder. Other: No abdominal wall hernia or abnormality. No abdominopelvic ascites. Musculoskeletal: Multilevel moderate to advanced degenerative disc disease throughout the visualized distal thoracic spine and lumbar spine. No acute osseous abnormality. No suspicious osseous findings. Left hip prosthesis is intact. IMPRESSION: 1. Mild dilatation of the right ureter with mucosal hyperenhancement, as seen on recent CT 01/01/2022, suggestive of urinary tract infection. Recommend correlation with urinalysis. 2. Stable appearance of the urinary bladder with scattered locules of air, presumed to be related to prior instrumentation. 3. Bilateral nonobstructive nephrolithiasis. 4. Prostatomegaly. 5. Colonic diverticulosis without diverticulitis. 6.  Aortic Atherosclerosis (ICD10-I70.0). Electronically Signed   By: Ileana Roup M.D.   On: 01/03/2022 16:41   DG Chest Portable 1 View  Result Date: 01/03/2022 CLINICAL DATA:  Coffee-ground emesis. EXAM: PORTABLE CHEST 1 VIEW COMPARISON:  02/21/2014 and prior chest radiographs FINDINGS: The cardiomediastinal silhouette is unremarkable. There is no evidence of focal airspace disease, pulmonary edema, suspicious pulmonary  nodule/mass, pleural effusion, or pneumothorax. No acute bony abnormalities are identified. RIGHT shoulder arthroplasty surgical hardware noted. Surgical clips overlying the UPPER abdomen are again noted. IMPRESSION: No active disease. Electronically Signed   By: Margarette Canada M.D.   On: 01/03/2022 13:52     Assessment and Plan:   Paroxysmal Atrial Fibrillation- unclear duration In the setting of PVCs, HTN, Age 48 and OSA, and mild non obstructive CAD In the setting of coffee ground emesis, stable hgb and three evaluations in 2 months for queries of bleeding diathesis S/p prior gastrectomy New systolic murmur - CHADSVASC=4. - TSH pending - Echo pending- I suspect he has some degree of AS - will need outpatient CPAP - in the setting of the above planned for rate control strategy unless significant new HFrEF, increasing to metoprolol 25 mg PO BID - we have discussed his stroke risk with patient and son- I do not plan to start [facility] or antiplatelet therapy this admission, if he could tolerate at least anti-platelet therapy he may be a candidate for LAA-O procedure   For questions or updates, please contact [facility] Please consult [email or web address] for contact info under     Signed, Werner Lean, MD  01/04/2022 8:56 AM

## 2022-01-04 NOTE — H&P (View-Only) (Signed)
Reason for Consult: Nausea vomiting one-time coffee-ground Referring Physician: Hospital team Joseph Simpson is an 87 y.o. male.  HPI: Patient seen and examined and discussed with the hospital team and he says his nausea and vomiting is just been going on for about 5 days and yesterday was the second time he was in the ER and both CTs were reviewed and only 1 time of his nausea vomiting did he see something that looked like coffee grounds and he has not seen any bright red and lately the vomitus has been yellow and he has a gastroenterologist in [place] but has not had an endoscopy in a few years and has been on Protonix and Reglan chronically for years and has not had any problem until his recent kidney stone issues and multiple antibiotics for urinary tract infections and his family history is negative from a GI standpoint and he denies any aspirin or nonsteroidals and has not moved his bowels in about a week but has not eaten much and has no other complaints  Past Medical History:  Diagnosis Date   Arthritis    B12 deficiency    Bladder calculi    CAD (coronary artery disease)    cardiologist--- dr Martinique;   abnormal nuclear stress test 11/ 2013;  s/p cardiac cath 10-21-2012 minimal nonobstructiive CAD w/ normal LVF   Dyslipidemia    Full dentures    Gastroparesis    followed by [name] PA ( [facility] in [place]) s/p gastrectomy for ulcers in 1970s   GERD (gastroesophageal reflux disease)    Heart murmur    Hiatal hernia    History of adenomatous polyp of colon    History of kidney stones    History of melanoma 2008   s/p left forearn wide local excision 06/ 2008   History of syncope 2010   cardiologist --- dr Martinique;  (11-25-2021 per pt no syncope or near syncope since)   Hypertension    Macular degeneration of both eyes    Nocturia associated with benign prostatic hyperplasia    OSA (obstructive sleep apnea)    no cpap   Polyneuropathy    PVC's (premature  ventricular contractions) 2010   06/ 2010 per event monitor SR w/ occasional PVCs and 4 beat SVT (in epic)   Wears glasses     Past Surgical History:  Procedure Laterality Date   APPENDECTOMY     child   CARDIAC CATHETERIZATION  10/2012   minimal nonobstructive CAD with normal LV function   CATARACT EXTRACTION W/ INTRAOCULAR LENS IMPLANT Bilateral    2009 approx   CYSTOSCOPY WITH LITHOLAPAXY N/A 07/13/2016   Procedure: CYSTOSCOPY WITH LITHOLAPAXY;  Surgeon: Franchot Gallo, MD;  Location: [facility];  Service: Urology;  Laterality: N/A;   CYSTOSCOPY WITH LITHOLAPAXY N/A 12/01/2021   Procedure: CYSTOSCOPY WITH LITHOLAPAXY;  Surgeon: Franchot Gallo, MD;  Location[facility];  Service: Urology;  Laterality: N/A;   CYSTOSCOPY/RETROGRADE/URETEROSCOPY/STONE EXTRACTION WITH BASKET  02/24/2012   Procedure: CYSTOSCOPY/RETROGRADE/URETEROSCOPY/STONE EXTRACTION WITH BASKET;  Surgeon: Franchot Gallo, MD;  Location: [facility];  Service: Urology;  Laterality: Bilateral;  1 HOUR   EXTRACORPOREAL SHOCK WAVE LITHOTRIPSY  11/16/2011   @[facility]    GASTRECTOMY     1970s  for ulcers   HEMORRHOID SURGERY     yrs ago   HOLMIUM LASER APPLICATION N/A 0/25/8527   Procedure: HOLMIUM LASER APPLICATION;  Surgeon: Franchot Gallo, MD;  Location: [facility];  Service: Urology;  Laterality: N/A;  INGUINAL HERNIA REPAIR Left 07/13/2016   Procedure: REPAIR LEFT INGUINAL HERNIA;  Surgeon: Armandina Gemma, MD;  Location: [facility];  Service: General;  Laterality: Left;   INSERTION OF MESH Left 07/13/2016   Procedure: INSERTION OF MESH;  Surgeon: Armandina Gemma, MD;  Location: [facility];  Service: General;  Laterality: Left;   MELANOMA EXCISION Left 05/05/2007   @[facility]  ;   LEFT FOREARM   TONSILLECTOMY     child   TOTAL HIP ARTHROPLASTY Left 02/11/2021   Procedure: TOTAL HIP ARTHROPLASTY;  Surgeon: Marchia Bond, MD;  Location: [facility];  Service: Orthopedics;  Laterality: Left;    TOTAL SHOULDER ARTHROPLASTY Right 12/29/2017   Procedure: TOTAL SHOULDER ARTHROPLASTY;  Surgeon: Hiram Gash, MD;  Location: [facility];  Service: Orthopedics;  Laterality: Right;    Family History  Problem Relation Age of Onset   Heart disease Mother        CABG in her 59s   Pneumonia Father    Cancer Sister        breast   Lung cancer Brother        smoker    Social History:  reports that he quit smoking about 65 years ago. His smoking use included cigarettes. He has a 0.40 pack-year smoking history. He has never used smokeless tobacco. He reports that he does not drink alcohol and does not use drugs.  Allergies:  Allergies  Allergen Reactions   Aspirin Other (See Comments)    UPSET STOMACH   Percocet [Oxycodone-Acetaminophen] Nausea And Vomiting   Vitamins [Apatate] Other (See Comments)    Bothers stomach    Medications: I have reviewed the patient's current medications.  Results for orders placed or performed during the hospital encounter of 01/03/22 (from the past 48 hour(s))  Comprehensive metabolic panel     Status: Abnormal   Collection Time: 01/03/22 10:48 AM  Result Value Ref Range   Sodium 138 135 - 145 mmol/L   Potassium 4.7 3.5 - 5.1 mmol/L   Chloride 99 98 - 111 mmol/L   CO2 29 22 - 32 mmol/L   Glucose, Bld 134 (H) 70 - 99 mg/dL    Comment: Glucose reference range applies only to samples taken after fasting for at least 8 hours.   BUN 31 (H) 8 - 23 mg/dL   Creatinine, Ser 1.07 0.61 - 1.24 mg/dL   Calcium 9.1 8.9 - 10.3 mg/dL   Total Protein 8.1 6.5 - 8.1 g/dL   Albumin 4.0 3.5 - 5.0 g/dL   AST 28 15 - 41 U/L   ALT 18 0 - 44 U/L   Alkaline Phosphatase 61 38 - 126 U/L   Total Bilirubin 1.3 (H) 0.3 - 1.2 mg/dL   GFR, Estimated >60 >60 mL/min    Comment: (NOTE) Calculated using the CKD-EPI Creatinine Equation (2021)    Anion gap 10 5 - 15    Comment: Performed at [facility], [place] [address] [place], [place] [address]  Lipase, blood      Status: None   Collection Time: 01/03/22 10:48 AM  Result Value Ref Range   Lipase 30 11 - 51 U/L    Comment: Performed at [facility], [place] [address] [place], [place] [address]  CBC with Diff     Status: Abnormal   Collection Time: 01/03/22 10:48 AM  Result Value Ref Range   WBC 8.9 4.0 - 10.5 K/uL   RBC 4.61 4.22 - 5.81 MIL/uL   Hemoglobin 15.0 13.0 -  17.0 g/dL   HCT 44.8 39.0 - 52.0 %   MCV 97.2 80.0 - 100.0 fL   MCH 32.5 26.0 - 34.0 pg   MCHC 33.5 30.0 - 36.0 g/dL   RDW 12.1 11.5 - 15.5 %   Platelets 216 150 - 400 K/uL   nRBC 0.0 0.0 - 0.2 %   Neutrophils Relative % 89 %   Neutro Abs 8.0 (H) 1.7 - 7.7 K/uL   Lymphocytes Relative 6 %   Lymphs Abs 0.5 (L) 0.7 - 4.0 K/uL   Monocytes Relative 4 %   Monocytes Absolute 0.3 0.1 - 1.0 K/uL   Eosinophils Relative 0 %   Eosinophils Absolute 0.0 0.0 - 0.5 K/uL   Basophils Relative 0 %   Basophils Absolute 0.0 0.0 - 0.1 K/uL   Immature Granulocytes 1 %   Abs Immature Granulocytes 0.04 0.00 - 0.07 K/uL    Comment: Performed at [facility], [place] [address], [place], [place] [address]  Lactic acid, plasma     Status: None   Collection Time: 01/03/22 12:33 PM  Result Value Ref Range   Lactic Acid, Venous 1.5 0.5 - 1.9 mmol/L    Comment: Performed at [facility], [place] [address] [place], [place] [address]  POC occult blood, ED     Status: None   Collection Time: 01/03/22 12:45 PM  Result Value Ref Range   Fecal Occult Bld NEGATIVE NEGATIVE  Urinalysis, Routine w reflex microscopic Urine, Clean Catch     Status: Abnormal   Collection Time: 01/03/22  1:11 PM  Result Value Ref Range   Color, Urine YELLOW YELLOW   APPearance CLOUDY (A) CLEAR   Specific Gravity, Urine 1.016 1.005 - 1.030   pH 5.0 5.0 - 8.0   Glucose, UA NEGATIVE NEGATIVE mg/dL   Hgb urine dipstick MODERATE (A) NEGATIVE   Bilirubin Urine NEGATIVE NEGATIVE   Ketones, ur 5 (A) NEGATIVE mg/dL   Protein, ur 30  (A) NEGATIVE mg/dL   Nitrite POSITIVE (A) NEGATIVE   Leukocytes,Ua LARGE (A) NEGATIVE   RBC / HPF 21-50 0 - 5 RBC/hpf   WBC, UA >50 (H) 0 - 5 WBC/hpf   Bacteria, UA MANY (A) NONE SEEN   Mucus PRESENT     Comment: Performed at [facility], [place] [address], [place], [place] [address]  Resp Panel by RT-PCR (Flu A&B, Covid) Nasopharyngeal Swab     Status: None   Collection Time: 01/03/22  1:56 PM   Specimen: Nasopharyngeal Swab; Nasopharyngeal(NP) swabs in vial transport medium  Result Value Ref Range   SARS Coronavirus 2 by RT PCR NEGATIVE NEGATIVE    Comment: (NOTE) SARS-CoV-2 target nucleic acids are NOT DETECTED.  The SARS-CoV-2 RNA is generally detectable in upper respiratory specimens during the acute phase of infection. The lowest concentration of SARS-CoV-2 viral copies this assay can detect is 138 copies/mL. A negative result does not preclude SARS-Cov-2 infection and should not be used as the sole basis for treatment or other patient management decisions. A negative result may occur with  improper specimen collection/handling, submission of specimen other than nasopharyngeal swab, presence of viral mutation(s) within the areas targeted by this assay, and inadequate number of viral copies(<138 copies/mL). A negative result must be combined with clinical observations, patient history, and epidemiological information. The expected result is Negative.  Fact Sheet for Patients:  EntrepreneurPulse.com.au  Fact Sheet for Healthcare Providers:  IncredibleEmployment.be  This test is no t yet approved or cleared  by the [place] and  has been authorized for detection and/or diagnosis of SARS-CoV-2 by FDA under an Emergency Use Authorization (EUA). This EUA will remain  in effect (meaning this test can be used) for the duration of the COVID-19 declaration under Section 564(b)(1) of the Act, 21 U.S.C.section  360bbb-3(b)(1), unless the authorization is terminated  or revoked sooner.       Influenza A by PCR NEGATIVE NEGATIVE   Influenza B by PCR NEGATIVE NEGATIVE    Comment: (NOTE) The Xpert Xpress SARS-CoV-2/FLU/RSV plus assay is intended as an aid in the diagnosis of influenza from Nasopharyngeal swab specimens and should not be used as a sole basis for treatment. Nasal washings and aspirates are unacceptable for Xpert Xpress SARS-CoV-2/FLU/RSV testing.  Fact Sheet for Patients: EntrepreneurPulse.com.au  Fact Sheet for Healthcare Providers: IncredibleEmployment.be  This test is not yet approved or cleared by the [place] FDA and has been authorized for detection and/or diagnosis of SARS-CoV-2 by FDA under an Emergency Use Authorization (EUA). This EUA will remain in effect (meaning this test can be used) for the duration of the COVID-19 declaration under Section 564(b)(1) of the Act, 21 U.S.C. section 360bbb-3(b)(1), unless the authorization is terminated or revoked.  Performed at [facility], [place] [address] [place], [place] [address]   Type and screen [place]     Status: None   Collection Time: 01/03/22  7:10 PM  Result Value Ref Range   ABO/RH(D) B POS    Antibody Screen NEG    Sample Expiration      01/06/2022,2359 Performed at [facility], [place] [address] [place], [place] [address]   Hemoglobin and hematocrit, blood     Status: None   Collection Time: 01/03/22  7:12 PM  Result Value Ref Range   Hemoglobin 14.6 13.0 - 17.0 g/dL   HCT 43.5 39.0 - 52.0 %    Comment: Performed at [facility], [place] [address], [place], [place] [address]  CBC WITH DIFFERENTIAL     Status: Abnormal   Collection Time: 01/04/22  3:55 AM  Result Value Ref Range   WBC 11.7 (H) 4.0 - 10.5 K/uL   RBC 4.78 4.22 - 5.81 MIL/uL   Hemoglobin 14.9 13.0 - 17.0 g/dL   HCT 45.7  39.0 - 52.0 %   MCV 95.6 80.0 - 100.0 fL   MCH 31.2 26.0 - 34.0 pg   MCHC 32.6 30.0 - 36.0 g/dL   RDW 12.1 11.5 - 15.5 %   Platelets 235 150 - 400 K/uL   nRBC 0.0 0.0 - 0.2 %   Neutrophils Relative % 88 %   Neutro Abs 10.2 (H) 1.7 - 7.7 K/uL   Lymphocytes Relative 7 %   Lymphs Abs 0.8 0.7 - 4.0 K/uL   Monocytes Relative 5 %   Monocytes Absolute 0.5 0.1 - 1.0 K/uL   Eosinophils Relative 0 %   Eosinophils Absolute 0.0 0.0 - 0.5 K/uL   Basophils Relative 0 %   Basophils Absolute 0.0 0.0 - 0.1 K/uL   Immature Granulocytes 0 %   Abs Immature Granulocytes 0.05 0.00 - 0.07 K/uL    Comment: Performed at [facility], [place] [address] [place], [place] [address]  Comprehensive metabolic panel     Status: Abnormal   Collection Time: 01/04/22  3:55 AM  Result Value Ref Range   Sodium 139 135 - 145 mmol/L   Potassium 3.8 3.5 - 5.1 mmol/L  Comment: DELTA CHECK NOTED   Chloride 103 98 - 111 mmol/L   CO2 26 22 - 32 mmol/L   Glucose, Bld 117 (H) 70 - 99 mg/dL    Comment: Glucose reference range applies only to samples taken after fasting for at least 8 hours.   BUN 23 8 - 23 mg/dL   Creatinine, Ser 0.84 0.61 - 1.24 mg/dL   Calcium 9.0 8.9 - 10.3 mg/dL   Total Protein 7.5 6.5 - 8.1 g/dL   Albumin 3.8 3.5 - 5.0 g/dL   AST 16 15 - 41 U/L   ALT 14 0 - 44 U/L   Alkaline Phosphatase 59 38 - 126 U/L   Total Bilirubin 0.6 0.3 - 1.2 mg/dL   GFR, Estimated >60 >60 mL/min    Comment: (NOTE) Calculated using the CKD-EPI Creatinine Equation (2021)    Anion gap 10 5 - 15    Comment: Performed at [facility], [place] [address], [place], [place] [address]  Protime-INR     Status: None   Collection Time: 01/04/22  3:55 AM  Result Value Ref Range   Prothrombin Time 14.8 11.4 - 15.2 seconds   INR 1.2 0.8 - 1.2    Comment: (NOTE) INR goal varies based on device and disease states. Performed at [facility], [place] [address], [place], [place]  [address]     CT ABDOMEN PELVIS W CONTRAST  Result Date: 01/03/2022 CLINICAL DATA:  Acute abdominal pain, nausea vomiting. EXAM: CT ABDOMEN AND PELVIS WITH CONTRAST TECHNIQUE: Multidetector CT imaging of the abdomen and pelvis was performed using the standard protocol following bolus administration of intravenous contrast. RADIATION DOSE REDUCTION: This exam was performed according to the departmental dose-optimization program which includes automated exposure control, adjustment of the mA and/or kV according to patient size and/or use of iterative reconstruction technique. CONTRAST:  121mL OMNIPAQUE IOHEXOL 300 MG/ML  SOLN COMPARISON:  None. FINDINGS: Lower chest: No acute abnormality. Subsegmental atelectasis and fine subpleural reticulation at the lung bases, right greater than left. Hepatobiliary: No focal liver abnormality is seen. No gallstones, gallbladder wall thickening, or biliary dilatation. Pancreas: Diffuse moderate parenchymal volume loss and fatty infiltration. No pancreatic ductal dilatation or surrounding inflammatory changes. Spleen: Normal in size without focal abnormality. Adrenals/Urinary Tract: Adrenal glands are unremarkable. Multiple subcentimeter bilateral simple cortical renal cysts. Nonobstructive calyceal stones are noted bilaterally measuring up to 0.5 cm at the lower pole of the right kidney (series 2, image 28). No hydronephrosis or perinephric fat stranding. Mild dilatation of the right ureter, slightly increased compared to 01/01/2022, with subtle mucosal hyperenhancement (series 4, images 72-77). There is an abrupt caliber change of the ureter as it crosses the iliac vessels (series 2, images 53-59). The urinary bladder is mildly distended with a few tiny locules of air within the lumen. Multiple bladder diverticuli are noted. Stomach/Bowel: Postoperative changes at the gastroesophageal junction. Distal gastrectomy with gastrojejunostomy. Diverticulosis coli. No evidence of bowel  wall thickening, obstruction, or other inflammatory changes. Vascular/Lymphatic: Aortic atherosclerosis. No enlarged abdominal or pelvic lymph nodes. Reproductive: Enlarged prostate extends into the inferior aspect of the urinary bladder. Other: No abdominal wall hernia or abnormality. No abdominopelvic ascites. Musculoskeletal: Multilevel moderate to advanced degenerative disc disease throughout the visualized distal thoracic spine and lumbar spine. No acute osseous abnormality. No suspicious osseous findings. Left hip prosthesis is intact. IMPRESSION: 1. Mild dilatation of the right ureter with mucosal hyperenhancement, as seen on recent CT 01/01/2022, suggestive of urinary tract infection. Recommend  correlation with urinalysis. 2. Stable appearance of the urinary bladder with scattered locules of air, presumed to be related to prior instrumentation. 3. Bilateral nonobstructive nephrolithiasis. 4. Prostatomegaly. 5. Colonic diverticulosis without diverticulitis. 6.  Aortic Atherosclerosis (ICD10-I70.0). Electronically Signed   By: Ileana Roup M.D.   On: 01/03/2022 16:41   DG Chest Portable 1 View  Result Date: 01/03/2022 CLINICAL DATA:  Coffee-ground emesis. EXAM: PORTABLE CHEST 1 VIEW COMPARISON:  02/21/2014 and prior chest radiographs FINDINGS: The cardiomediastinal silhouette is unremarkable. There is no evidence of focal airspace disease, pulmonary edema, suspicious pulmonary nodule/mass, pleural effusion, or pneumothorax. No acute bony abnormalities are identified. RIGHT shoulder arthroplasty surgical hardware noted. Surgical clips overlying the UPPER abdomen are again noted. IMPRESSION: No active disease. Electronically Signed   By: Margarette Canada M.D.   On: 01/03/2022 13:52    ROS negative except above except for some lower abdominal discomfort which has been going on for few months because of the kidney stones he says Blood pressure (!) 159/70, pulse 87, temperature 98.2 F (36.8 C), temperature  source Oral, resp. rate 18, height 6' (1.829 m), weight 77.1 kg, SpO2 98 %. Physical Exam vital signs stable afebrile no acute distress minimal lower discomfort no guarding or rebound labs okay CT x2 reviewed  Assessment/Plan: Nausea vomiting with one-time coffee-ground emesis in a patient with kidney stones and UTI Plan: We will resume his home Reglan in addition to his IV pump inhibitor and use the Reglan IV and will try clear liquids and see how he does and consider endoscopy later in the week if he does not improve or if signs of active bleeding and will ask the rounding team to check on tomorrow  [facility] E 01/04/2022, 10:22 AM

## 2022-01-04 NOTE — Consult Note (Signed)
Reason for Consult: Nausea vomiting one-time coffee-ground Referring Physician: Hospital team Joseph Simpson is an 87 y.o. male.  HPI: Patient seen and examined and discussed with the hospital team and he says his nausea and vomiting is just been going on for about 5 days and yesterday was the second time he was in the ER and both CTs were reviewed and only 1 time of his nausea vomiting did he see something that looked like coffee grounds and he has not seen any bright red and lately the vomitus has been yellow and he has a gastroenterologist in [place] but has not had an endoscopy in a few years and has been on Protonix and Reglan chronically for years and has not had any problem until his recent kidney stone issues and multiple antibiotics for urinary tract infections and his family history is negative from a GI standpoint and he denies any aspirin or nonsteroidals and has not moved his bowels in about a week but has not eaten much and has no other complaints  Past Medical History:  Diagnosis Date   Arthritis    B12 deficiency    Bladder calculi    CAD (coronary artery disease)    cardiologist--- dr Martinique;   abnormal nuclear stress test 11/ 2013;  s/p cardiac cath 10-21-2012 minimal nonobstructiive CAD w/ normal LVF   Dyslipidemia    Full dentures    Gastroparesis    followed by [name] PA ( [facility] in [place]) s/p gastrectomy for ulcers in 1970s   GERD (gastroesophageal reflux disease)    Heart murmur    Hiatal hernia    History of adenomatous polyp of colon    History of kidney stones    History of melanoma 2008   s/p left forearn wide local excision 06/ 2008   History of syncope 2010   cardiologist --- dr Martinique;  (11-25-2021 per pt no syncope or near syncope since)   Hypertension    Macular degeneration of both eyes    Nocturia associated with benign prostatic hyperplasia    OSA (obstructive sleep apnea)    no cpap   Polyneuropathy    PVC's (premature  ventricular contractions) 2010   06/ 2010 per event monitor SR w/ occasional PVCs and 4 beat SVT (in epic)   Wears glasses     Past Surgical History:  Procedure Laterality Date   APPENDECTOMY     child   CARDIAC CATHETERIZATION  10/2012   minimal nonobstructive CAD with normal LV function   CATARACT EXTRACTION W/ INTRAOCULAR LENS IMPLANT Bilateral    2009 approx   CYSTOSCOPY WITH LITHOLAPAXY N/A 07/13/2016   Procedure: CYSTOSCOPY WITH LITHOLAPAXY;  Surgeon: Franchot Gallo, MD;  Location: [facility];  Service: Urology;  Laterality: N/A;   CYSTOSCOPY WITH LITHOLAPAXY N/A 12/01/2021   Procedure: CYSTOSCOPY WITH LITHOLAPAXY;  Surgeon: Franchot Gallo, MD;  Location: [facility];  Service: Urology;  Laterality: N/A;   CYSTOSCOPY/RETROGRADE/URETEROSCOPY/STONE EXTRACTION WITH BASKET  02/24/2012   Procedure: CYSTOSCOPY/RETROGRADE/URETEROSCOPY/STONE EXTRACTION WITH BASKET;  Surgeon: Franchot Gallo, MD;  [facility];  Service: Urology;  Laterality: Bilateral;  1 HOUR   EXTRACORPOREAL SHOCK WAVE LITHOTRIPSY  11/16/2011   @[facility]    GASTRECTOMY     1970s  for ulcers   HEMORRHOID SURGERY     yrs ago   HOLMIUM LASER APPLICATION N/A 11/18/7251   Procedure: HOLMIUM LASER APPLICATION;  Surgeon: Franchot Gallo, MD;  Location[facility];  Service: Urology;  Laterality: N/A;  INGUINAL HERNIA REPAIR Left 07/13/2016   Procedure: REPAIR LEFT INGUINAL HERNIA;  Surgeon: Armandina Gemma, MD;  Location: [facility];  Service: General;  Laterality: Left;   INSERTION OF MESH Left 07/13/2016   Procedure: INSERTION OF MESH;  Surgeon: Armandina Gemma, MD;  Location: [facility];  Service: General;  Laterality: Left;   MELANOMA EXCISION Left 05/05/2007   @[facility]  ;   LEFT FOREARM   TONSILLECTOMY     child   TOTAL HIP ARTHROPLASTY Left 02/11/2021   Procedure: TOTAL HIP ARTHROPLASTY;  Surgeon: Marchia Bond, MD;  Location: [facility];  Service: Orthopedics;  Laterality: Left;    TOTAL SHOULDER ARTHROPLASTY Right 12/29/2017   Procedure: TOTAL SHOULDER ARTHROPLASTY;  Surgeon: Hiram Gash, MD;  Location: [facility];  Service: Orthopedics;  Laterality: Right;    Family History  Problem Relation Age of Onset   Heart disease Mother        CABG in her 29s   Pneumonia Father    Cancer Sister        breast   Lung cancer Brother        smoker    Social History:  reports that he quit smoking about 65 years ago. His smoking use included cigarettes. He has a 0.40 pack-year smoking history. He has never used smokeless tobacco. He reports that he does not drink alcohol and does not use drugs.  Allergies:  Allergies  Allergen Reactions   Aspirin Other (See Comments)    UPSET STOMACH   Percocet [Oxycodone-Acetaminophen] Nausea And Vomiting   Vitamins [Apatate] Other (See Comments)    Bothers stomach    Medications: I have reviewed the patient's current medications.  Results for orders placed or performed during the hospital encounter of 01/03/22 (from the past 48 hour(s))  Comprehensive metabolic panel     Status: Abnormal   Collection Time: 01/03/22 10:48 AM  Result Value Ref Range   Sodium 138 135 - 145 mmol/L   Potassium 4.7 3.5 - 5.1 mmol/L   Chloride 99 98 - 111 mmol/L   CO2 29 22 - 32 mmol/L   Glucose, Bld 134 (H) 70 - 99 mg/dL    Comment: Glucose reference range applies only to samples taken after fasting for at least 8 hours.   BUN 31 (H) 8 - 23 mg/dL   Creatinine, Ser 1.07 0.61 - 1.24 mg/dL   Calcium 9.1 8.9 - 10.3 mg/dL   Total Protein 8.1 6.5 - 8.1 g/dL   Albumin 4.0 3.5 - 5.0 g/dL   AST 28 15 - 41 U/L   ALT 18 0 - 44 U/L   Alkaline Phosphatase 61 38 - 126 U/L   Total Bilirubin 1.3 (H) 0.3 - 1.2 mg/dL   GFR, Estimated >60 >60 mL/min    Comment: (NOTE) Calculated using the CKD-EPI Creatinine Equation (2021)    Anion gap 10 5 - 15    Comment: Performed at [facility], [place] [address], [place], [place] [address]  Lipase, blood      Status: None   Collection Time: 01/03/22 10:48 AM  Result Value Ref Range   Lipase 30 11 - 51 U/L    Comment: Performed at [facility], [place] [address], [place], [place] [address]  CBC with Diff     Status: Abnormal   Collection Time: 01/03/22 10:48 AM  Result Value Ref Range   WBC 8.9 4.0 - 10.5 K/uL   RBC 4.61 4.22 - 5.81 MIL/uL   Hemoglobin 15.0 13.0 -  17.0 g/dL   HCT 44.8 39.0 - 52.0 %   MCV 97.2 80.0 - 100.0 fL   MCH 32.5 26.0 - 34.0 pg   MCHC 33.5 30.0 - 36.0 g/dL   RDW 12.1 11.5 - 15.5 %   Platelets 216 150 - 400 K/uL   nRBC 0.0 0.0 - 0.2 %   Neutrophils Relative % 89 %   Neutro Abs 8.0 (H) 1.7 - 7.7 K/uL   Lymphocytes Relative 6 %   Lymphs Abs 0.5 (L) 0.7 - 4.0 K/uL   Monocytes Relative 4 %   Monocytes Absolute 0.3 0.1 - 1.0 K/uL   Eosinophils Relative 0 %   Eosinophils Absolute 0.0 0.0 - 0.5 K/uL   Basophils Relative 0 %   Basophils Absolute 0.0 0.0 - 0.1 K/uL   Immature Granulocytes 1 %   Abs Immature Granulocytes 0.04 0.00 - 0.07 K/uL    Comment: Performed at [facility], [place] [address] [place], [place] [address]  Lactic acid, plasma     Status: None   Collection Time: 01/03/22 12:33 PM  Result Value Ref Range   Lactic Acid, Venous 1.5 0.5 - 1.9 mmol/L    Comment: Performed at [facility], [place] [address], [place], [place] [address]  POC occult blood, ED     Status: None   Collection Time: 01/03/22 12:45 PM  Result Value Ref Range   Fecal Occult Bld NEGATIVE NEGATIVE  Urinalysis, Routine w reflex microscopic Urine, Clean Catch     Status: Abnormal   Collection Time: 01/03/22  1:11 PM  Result Value Ref Range   Color, Urine YELLOW YELLOW   APPearance CLOUDY (A) CLEAR   Specific Gravity, Urine 1.016 1.005 - 1.030   pH 5.0 5.0 - 8.0   Glucose, UA NEGATIVE NEGATIVE mg/dL   Hgb urine dipstick MODERATE (A) NEGATIVE   Bilirubin Urine NEGATIVE NEGATIVE   Ketones, ur 5 (A) NEGATIVE mg/dL   Protein, ur 30  (A) NEGATIVE mg/dL   Nitrite POSITIVE (A) NEGATIVE   Leukocytes,Ua LARGE (A) NEGATIVE   RBC / HPF 21-50 0 - 5 RBC/hpf   WBC, UA >50 (H) 0 - 5 WBC/hpf   Bacteria, UA MANY (A) NONE SEEN   Mucus PRESENT     Comment: Performed at [facility], [place] [address], [place], [place] [address]  Resp Panel by RT-PCR (Flu A&B, Covid) Nasopharyngeal Swab     Status: None   Collection Time: 01/03/22  1:56 PM   Specimen: Nasopharyngeal Swab; Nasopharyngeal(NP) swabs in vial transport medium  Result Value Ref Range   SARS Coronavirus 2 by RT PCR NEGATIVE NEGATIVE    Comment: (NOTE) SARS-CoV-2 target nucleic acids are NOT DETECTED.  The SARS-CoV-2 RNA is generally detectable in upper respiratory specimens during the acute phase of infection. The lowest concentration of SARS-CoV-2 viral copies this assay can detect is 138 copies/mL. A negative result does not preclude SARS-Cov-2 infection and should not be used as the sole basis for treatment or other patient management decisions. A negative result may occur with  improper specimen collection/handling, submission of specimen other than nasopharyngeal swab, presence of viral mutation(s) within the areas targeted by this assay, and inadequate number of viral copies(<138 copies/mL). A negative result must be combined with clinical observations, patient history, and epidemiological information. The expected result is Negative.  Fact Sheet for Patients:  EntrepreneurPulse.com.au  Fact Sheet for Healthcare Providers:  IncredibleEmployment.be  This test is no t yet approved or cleared  by the [place] and  has been authorized for detection and/or diagnosis of SARS-CoV-2 by FDA under an Emergency Use Authorization (EUA). This EUA will remain  in effect (meaning this test can be used) for the duration of the COVID-19 declaration under Section 564(b)(1) of the Act, 21 U.S.C.section  360bbb-3(b)(1), unless the authorization is terminated  or revoked sooner.       Influenza A by PCR NEGATIVE NEGATIVE   Influenza B by PCR NEGATIVE NEGATIVE    Comment: (NOTE) The Xpert Xpress SARS-CoV-2/FLU/RSV plus assay is intended as an aid in the diagnosis of influenza from Nasopharyngeal swab specimens and should not be used as a sole basis for treatment. Nasal washings and aspirates are unacceptable for Xpert Xpress SARS-CoV-2/FLU/RSV testing.  Fact Sheet for Patients: EntrepreneurPulse.com.au  Fact Sheet for Healthcare Providers: IncredibleEmployment.be  This test is not yet approved or cleared by the [place] FDA and has been authorized for detection and/or diagnosis of SARS-CoV-2 by FDA under an Emergency Use Authorization (EUA). This EUA will remain in effect (meaning this test can be used) for the duration of the COVID-19 declaration under Section 564(b)(1) of the Act, 21 U.S.C. section 360bbb-3(b)(1), unless the authorization is terminated or revoked.  Performed at [facility], [place] [address], [place], [place] [address]   Type and screen [place]     Status: None   Collection Time: 01/03/22  7:10 PM  Result Value Ref Range   ABO/RH(D) B POS    Antibody Screen NEG    Sample Expiration      01/06/2022,2359 Performed at [facility], [place] [address], [place], [place] [address]   Hemoglobin and hematocrit, blood     Status: None   Collection Time: 01/03/22  7:12 PM  Result Value Ref Range   Hemoglobin 14.6 13.0 - 17.0 g/dL   HCT 43.5 39.0 - 52.0 %    Comment: Performed at [facility], [place] [address] [place], [place] [address]  CBC WITH DIFFERENTIAL     Status: Abnormal   Collection Time: 01/04/22  3:55 AM  Result Value Ref Range   WBC 11.7 (H) 4.0 - 10.5 K/uL   RBC 4.78 4.22 - 5.81 MIL/uL   Hemoglobin 14.9 13.0 - 17.0 g/dL   HCT 45.7  39.0 - 52.0 %   MCV 95.6 80.0 - 100.0 fL   MCH 31.2 26.0 - 34.0 pg   MCHC 32.6 30.0 - 36.0 g/dL   RDW 12.1 11.5 - 15.5 %   Platelets 235 150 - 400 K/uL   nRBC 0.0 0.0 - 0.2 %   Neutrophils Relative % 88 %   Neutro Abs 10.2 (H) 1.7 - 7.7 K/uL   Lymphocytes Relative 7 %   Lymphs Abs 0.8 0.7 - 4.0 K/uL   Monocytes Relative 5 %   Monocytes Absolute 0.5 0.1 - 1.0 K/uL   Eosinophils Relative 0 %   Eosinophils Absolute 0.0 0.0 - 0.5 K/uL   Basophils Relative 0 %   Basophils Absolute 0.0 0.0 - 0.1 K/uL   Immature Granulocytes 0 %   Abs Immature Granulocytes 0.05 0.00 - 0.07 K/uL    Comment: Performed at [facility], [place] [address] [place], [place] [address]  Comprehensive metabolic panel     Status: Abnormal   Collection Time: 01/04/22  3:55 AM  Result Value Ref Range   Sodium 139 135 - 145 mmol/L   Potassium 3.8 3.5 - 5.1 mmol/L  Comment: DELTA CHECK NOTED   Chloride 103 98 - 111 mmol/L   CO2 26 22 - 32 mmol/L   Glucose, Bld 117 (H) 70 - 99 mg/dL    Comment: Glucose reference range applies only to samples taken after fasting for at least 8 hours.   BUN 23 8 - 23 mg/dL   Creatinine, Ser 0.84 0.61 - 1.24 mg/dL   Calcium 9.0 8.9 - 10.3 mg/dL   Total Protein 7.5 6.5 - 8.1 g/dL   Albumin 3.8 3.5 - 5.0 g/dL   AST 16 15 - 41 U/L   ALT 14 0 - 44 U/L   Alkaline Phosphatase 59 38 - 126 U/L   Total Bilirubin 0.6 0.3 - 1.2 mg/dL   GFR, Estimated >60 >60 mL/min    Comment: (NOTE) Calculated using the CKD-EPI Creatinine Equation (2021)    Anion gap 10 5 - 15    Comment: Performed at [facility], [place] [address], [place], [place] [address]  Protime-INR     Status: None   Collection Time: 01/04/22  3:55 AM  Result Value Ref Range   Prothrombin Time 14.8 11.4 - 15.2 seconds   INR 1.2 0.8 - 1.2    Comment: (NOTE) INR goal varies based on device and disease states. Performed at [facility], [place] [address] [place],   [address]     CT ABDOMEN PELVIS W CONTRAST  Result Date: 01/03/2022 CLINICAL DATA:  Acute abdominal pain, nausea vomiting. EXAM: CT ABDOMEN AND PELVIS WITH CONTRAST TECHNIQUE: Multidetector CT imaging of the abdomen and pelvis was performed using the standard protocol following bolus administration of intravenous contrast. RADIATION DOSE REDUCTION: This exam was performed according to the departmental dose-optimization program which includes automated exposure control, adjustment of the mA and/or kV according to patient size and/or use of iterative reconstruction technique. CONTRAST:  127mL OMNIPAQUE IOHEXOL 300 MG/ML  SOLN COMPARISON:  None. FINDINGS: Lower chest: No acute abnormality. Subsegmental atelectasis and fine subpleural reticulation at the lung bases, right greater than left. Hepatobiliary: No focal liver abnormality is seen. No gallstones, gallbladder wall thickening, or biliary dilatation. Pancreas: Diffuse moderate parenchymal volume loss and fatty infiltration. No pancreatic ductal dilatation or surrounding inflammatory changes. Spleen: Normal in size without focal abnormality. Adrenals/Urinary Tract: Adrenal glands are unremarkable. Multiple subcentimeter bilateral simple cortical renal cysts. Nonobstructive calyceal stones are noted bilaterally measuring up to 0.5 cm at the lower pole of the right kidney (series 2, image 28). No hydronephrosis or perinephric fat stranding. Mild dilatation of the right ureter, slightly increased compared to 01/01/2022, with subtle mucosal hyperenhancement (series 4, images 72-77). There is an abrupt caliber change of the ureter as it crosses the iliac vessels (series 2, images 53-59). The urinary bladder is mildly distended with a few tiny locules of air within the lumen. Multiple bladder diverticuli are noted. Stomach/Bowel: Postoperative changes at the gastroesophageal junction. Distal gastrectomy with gastrojejunostomy. Diverticulosis coli. No evidence of bowel  wall thickening, obstruction, or other inflammatory changes. Vascular/Lymphatic: Aortic atherosclerosis. No enlarged abdominal or pelvic lymph nodes. Reproductive: Enlarged prostate extends into the inferior aspect of the urinary bladder. Other: No abdominal wall hernia or abnormality. No abdominopelvic ascites. Musculoskeletal: Multilevel moderate to advanced degenerative disc disease throughout the visualized distal thoracic spine and lumbar spine. No acute osseous abnormality. No suspicious osseous findings. Left hip prosthesis is intact. IMPRESSION: 1. Mild dilatation of the right ureter with mucosal hyperenhancement, as seen on recent CT 01/01/2022, suggestive of urinary tract infection. Recommend  correlation with urinalysis. 2. Stable appearance of the urinary bladder with scattered locules of air, presumed to be related to prior instrumentation. 3. Bilateral nonobstructive nephrolithiasis. 4. Prostatomegaly. 5. Colonic diverticulosis without diverticulitis. 6.  Aortic Atherosclerosis (ICD10-I70.0). Electronically Signed   By: Ileana Roup M.D.   On: 01/03/2022 16:41   DG Chest Portable 1 View  Result Date: 01/03/2022 CLINICAL DATA:  Coffee-ground emesis. EXAM: PORTABLE CHEST 1 VIEW COMPARISON:  02/21/2014 and prior chest radiographs FINDINGS: The cardiomediastinal silhouette is unremarkable. There is no evidence of focal airspace disease, pulmonary edema, suspicious pulmonary nodule/mass, pleural effusion, or pneumothorax. No acute bony abnormalities are identified. RIGHT shoulder arthroplasty surgical hardware noted. Surgical clips overlying the UPPER abdomen are again noted. IMPRESSION: No active disease. Electronically Signed   By: Margarette Canada M.D.   On: 01/03/2022 13:52    ROS negative except above except for some lower abdominal discomfort which has been going on for few months because of the kidney stones he says Blood pressure (!) 159/70, pulse 87, temperature 98.2 F (36.8 C), temperature  source Oral, resp. rate 18, height 6' (1.829 m), weight 77.1 kg, SpO2 98 %. Physical Exam vital signs stable afebrile no acute distress minimal lower discomfort no guarding or rebound labs okay CT x2 reviewed  Assessment/Plan: Nausea vomiting with one-time coffee-ground emesis in a patient with kidney stones and UTI Plan: We will resume his home Reglan in addition to his IV pump inhibitor and use the Reglan IV and will try clear liquids and see how he does and consider endoscopy later in the week if he does not improve or if signs of active bleeding and will ask the rounding team to check on tomorrow  [facility] 01/04/2022, 10:22 AM

## 2022-01-04 NOTE — Progress Notes (Signed)
PROGRESS NOTE   Joseph Simpson  XTG:[id number] DOB: [birth year] DOA: 01/03/2022 PCP: Marin Olp, MD  Brief Narrative:  30 white male community dwelling-high-level function still drives motor bikes-quit smoking drinking age 87 Known history of nephrolithiasis Squamous cell CA and melanoma ? CAD with nonobstructive CAD 2013 -P Afib versus PVC foll Dr. P [place] not on [facility] GERD/gastroparesis, HTN HLD Prior ? Bilroth in ? 2013 past for ulcer? colonoscopy 5 years prior?    Admitted with single episode coffee-ground emesis in the setting of recent ED visit 2/16 with hematochezia Also slightly more distant history removal multiple urolithiasis Dr. Diona Fanti 1/70  GI consulted, cardiology consulted for new onset A-fib  Hospital-Problem based course  ?  GI bleed,?  Erosive gastritis versus MW tear precipitant antibiotics?  Prior Billroth I 2/2 gastric ulcer in the remote past Hemoglobin very stable appreciate Dr. Watt Climes input Protonix gtt./transition to twice daily-resuming Reglan IV 5 mg 3 times daily as per GI + advanced to clears-?  Endoscopy this week Nausea vomiting overnight secondary to possible gastroparesis, Billroth anatomy Nonbilious nonbloody--appears he takes Reglan at home which has been restarted He is keeping down clears now Compazine first choice-avoid Zofran given A-fib New onset A-fib in the setting of prior PVCs-CHADS2 score >4, has bled score >3 No anticoagulation at this time-rate control increased to Toprol 25 twice daily Echo pending-appreciate cardiology input--follow TSH Ensure magnesium is replaced in a.m. CAD medically managed based on nonobstructive cath 2013 Apparently aspirin intolerant-monitor Cut back rate of losartan to 12.5 continue metoprolol 25 twice daily, as needed hydralazine 5 mg >160 BP Known squamous cell CA, prior melanoma Outpatient reevaluation BPH Urolithiasis status post intervention 11/2021 with ESBL in urine currently Flomax 0.4 Have  sent secure chat to urologist resident with regards to inpatient work-up/management of retained stones-May need this intervened upon given recurrent nidus for infection-patient has had several UTIs and been on several rounds of antibiotics since index surgery Retinopathy NOS Continue Avastin injections as per prior protocol  DVT prophylaxis: SCD at this time Code Status: Full Family Communication: D/W Mr. Finnis Colee at the bedside his son-phone number [phone] Disposition:  Status is: Inpatient Remains inpatient appropriate because: Need for endoscopy and rate control    Consultants:  [facility]  Procedures: None yet  Antimicrobials: Meropenem   Subjective: Awake coherent no distress no fever no chills looks well-tolerating liquids no chest pain He has not been out of bed yet but seems comfortable He has no burning in his urine  Objective: Vitals:   01/03/22 2011 01/04/22 0149 01/04/22 0519 01/04/22 0757  BP: (!) 164/96 (!) 166/96 (!) 168/93 (!) 159/70  Pulse: 81 (!) 104 88 87  Resp: 18 19 19 18   Temp: 97.7 F (36.5 C) 97.6 F (36.4 C) 98.7 F (37.1 C) 98.2 F (36.8 C)  TempSrc: Oral Oral Oral Oral  SpO2: 97% 99% 98% 98%  Weight:      Height:        Intake/Output Summary (Last 24 hours) at 01/04/2022 1218 Last data filed at 01/04/2022 0800 Gross per 24 hour  Intake 1777.36 ml  Output 1170 ml  Net 607.36 ml   Filed Weights   01/03/22 1041  Weight: 77.1 kg    Examination:  Pleasant coherent no distress looks younger than stated age EOMI NCAT Chest clear no rales rhonchi S1-S2 irregularly irregular-telemetry reviewed = A-fib relatively rate controlled low 100 range Abdomen soft nontender-condom cath in situ No lower extremity edema  Data Reviewed:  personally reviewed   CBC    Component Value Date/Time   WBC 11.7 (H) 01/04/2022 0355   RBC 4.78 01/04/2022 0355   HGB 14.9 01/04/2022 0355   HCT 45.7 01/04/2022 0355   PLT  235 01/04/2022 0355   MCV 95.6 01/04/2022 0355   MCH 31.2 01/04/2022 0355   MCHC 32.6 01/04/2022 0355   RDW 12.1 01/04/2022 0355   LYMPHSABS 0.8 01/04/2022 0355   MONOABS 0.5 01/04/2022 0355   EOSABS 0.0 01/04/2022 0355   BASOSABS 0.0 01/04/2022 0355   CMP Latest Ref Rng & Units 01/04/2022 01/03/2022 01/01/2022  Glucose 70 - 99 mg/dL 117(H) 134(H) 163(H)  BUN 8 - 23 mg/dL 23 31(H) 25(H)  Creatinine 0.61 - 1.24 mg/dL 0.84 1.07 1.32(H)  Sodium 135 - 145 mmol/L 139 138 137  Potassium 3.5 - 5.1 mmol/L 3.8 4.7 4.0  Chloride 98 - 111 mmol/L 103 99 99  CO2 22 - 32 mmol/L 26 29 24   Calcium 8.9 - 10.3 mg/dL 9.0 9.1 9.8  Total Protein 6.5 - 8.1 g/dL 7.5 8.1 8.1  Total Bilirubin 0.3 - 1.2 mg/dL 0.6 1.3(H) 1.3(H)  Alkaline Phos 38 - 126 U/L 59 61 71  AST 15 - 41 U/L 16 28 21   ALT 0 - 44 U/L 14 18 16      Radiology Studies: CT ABDOMEN PELVIS W CONTRAST  Result Date: 01/03/2022 CLINICAL DATA:  Acute abdominal pain, nausea vomiting. EXAM: CT ABDOMEN AND PELVIS WITH CONTRAST TECHNIQUE: Multidetector CT imaging of the abdomen and pelvis was performed using the standard protocol following bolus administration of intravenous contrast. RADIATION DOSE REDUCTION: This exam was performed according to the departmental dose-optimization program which includes automated exposure control, adjustment of the mA and/or kV according to patient size and/or use of iterative reconstruction technique. CONTRAST:  181mL OMNIPAQUE IOHEXOL 300 MG/ML  SOLN COMPARISON:  None. FINDINGS: Lower chest: No acute abnormality. Subsegmental atelectasis and fine subpleural reticulation at the lung bases, right greater than left. Hepatobiliary: No focal liver abnormality is seen. No gallstones, gallbladder wall thickening, or biliary dilatation. Pancreas: Diffuse moderate parenchymal volume loss and fatty infiltration. No pancreatic ductal dilatation or surrounding inflammatory changes. Spleen: Normal in size without focal abnormality.  Adrenals/Urinary Tract: Adrenal glands are unremarkable. Multiple subcentimeter bilateral simple cortical renal cysts. Nonobstructive calyceal stones are noted bilaterally measuring up to 0.5 cm at the lower pole of the right kidney (series 2, image 28). No hydronephrosis or perinephric fat stranding. Mild dilatation of the right ureter, slightly increased compared to 01/01/2022, with subtle mucosal hyperenhancement (series 4, images 72-77). There is an abrupt caliber change of the ureter as it crosses the iliac vessels (series 2, images 53-59). The urinary bladder is mildly distended with a few tiny locules of air within the lumen. Multiple bladder diverticuli are noted. Stomach/Bowel: Postoperative changes at the gastroesophageal junction. Distal gastrectomy with gastrojejunostomy. Diverticulosis coli. No evidence of bowel wall thickening, obstruction, or other inflammatory changes. Vascular/Lymphatic: Aortic atherosclerosis. No enlarged abdominal or pelvic lymph nodes. Reproductive: Enlarged prostate extends into the inferior aspect of the urinary bladder. Other: No abdominal wall hernia or abnormality. No abdominopelvic ascites. Musculoskeletal: Multilevel moderate to advanced degenerative disc disease throughout the visualized distal thoracic spine and lumbar spine. No acute osseous abnormality. No suspicious osseous findings. Left hip prosthesis is intact. IMPRESSION: 1. Mild dilatation of the right ureter with mucosal hyperenhancement, as seen on recent CT 01/01/2022, suggestive of urinary tract infection. Recommend correlation with urinalysis. 2. Stable appearance of the urinary bladder with  scattered locules of air, presumed to be related to prior instrumentation. 3. Bilateral nonobstructive nephrolithiasis. 4. Prostatomegaly. 5. Colonic diverticulosis without diverticulitis. 6.  Aortic Atherosclerosis (ICD10-I70.0). Electronically Signed   By: Ileana Roup M.D.   On: 01/03/2022 16:41   DG Chest Portable  1 View  Result Date: 01/03/2022 CLINICAL DATA:  Coffee-ground emesis. EXAM: PORTABLE CHEST 1 VIEW COMPARISON:  02/21/2014 and prior chest radiographs FINDINGS: The cardiomediastinal silhouette is unremarkable. There is no evidence of focal airspace disease, pulmonary edema, suspicious pulmonary nodule/mass, pleural effusion, or pneumothorax. No acute bony abnormalities are identified. RIGHT shoulder arthroplasty surgical hardware noted. Surgical clips overlying the UPPER abdomen are again noted. IMPRESSION: No active disease. Electronically Signed   By: Margarette Canada M.D.   On: 01/03/2022 13:52     Scheduled Meds:  Carboxymethylcellulose Sodium  1 drop Both Eyes QHS   gatifloxacin  1 drop Both Eyes QID   [START ON 01/05/2022] losartan  12.5 mg Oral Daily   metoCLOPramide (REGLAN) injection  5 mg Intravenous Q8H   metoprolol tartrate  25 mg Oral BID   [START ON 01/07/2022] pantoprazole  40 mg Intravenous Q12H   tamsulosin  0.4 mg Oral Daily   Continuous Infusions:  sodium chloride 125 mL/hr at 01/04/22 1212   meropenem (MERREM) IV     pantoprazole 8 mg/hr (01/04/22 0145)     LOS: 1 day   Time spent: [place], MD [facility] To contact the attending provider between 7A-7P or the covering provider during after hours 7P-7A, please log into the web site [email or web address] and access using universal [place] password for that web site. If you do not have the password, please call the hospital operator.  01/04/2022, 12:18 PM

## 2022-01-04 NOTE — Consult Note (Addendum)
Urology Consult Note   Requesting Attending Physician:  Nita Sells, MD Service Providing Consult: Urology  Consulting Attending: Harold Barban MD   Reason for Consult:  Urinary tract infection, A fib, history of urolithiasis  HPI: Joseph Simpson is seen in consultation for reasons noted above at the request of Nita Sells, MD for evaluation of UTI in setting of known BPH and urolithiasis.   87 y.o. male with past medical history of coronary artery disease, paroxysmal A-fib, GERD, hypertension, hyperlipidemia, squamous cell carcinoma and melanoma, notably of BPH with poor emptying and bladder stones most recently treated by Dr. Diona Fanti in late January with cystolitholopaxy.  Intermittent urinary retention since.  Requiring Foley catheter on and off.  Most recently underwent a trial of void in our clinic on 12/30/2021 and was started on tamsulosin.  Currently voiding spontaneously.  Presents with hematochezia and found to have urinalysis suspicious for UTI.  Presents to the hospital feeling malaise and intolerance of p.o. intake.  Currently afebrile but in new onset atrial fibrillation.  Being worked up by the hospitalist team for his GI bleed.  CT scan revealed enhancement and dilation of the right ureter (chronic) down to the level of the bladder concerning for both UTI and poor emptying in the setting of a moderately distended bladder.  Patient was in and out catheterized by the nurse for 700 cc.  Past Medical History: Past Medical History:  Diagnosis Date   Arthritis    B12 deficiency    Bladder calculi    CAD (coronary artery disease)    cardiologist--- dr Martinique;   abnormal nuclear stress test 11/ 2013;  s/p cardiac cath 10-21-2012 minimal nonobstructiive CAD w/ normal LVF   Dyslipidemia    Full dentures    Gastroparesis    followed by [name] PA ( [facility] in [place]) s/p gastrectomy for ulcers in 1970s   GERD (gastroesophageal reflux disease)     Heart murmur    Hiatal hernia    History of adenomatous polyp of colon    History of kidney stones    History of melanoma 2008   s/p left forearn wide local excision 06/ 2008   History of syncope 2010   cardiologist --- dr Martinique;  (11-25-2021 per pt no syncope or near syncope since)   Hypertension    Macular degeneration of both eyes    Nocturia associated with benign prostatic hyperplasia    OSA (obstructive sleep apnea)    no cpap   Polyneuropathy    PVC's (premature ventricular contractions) 2010   06/ 2010 per event monitor SR w/ occasional PVCs and 4 beat SVT (in epic)   Wears glasses     Past Surgical History:  Past Surgical History:  Procedure Laterality Date   APPENDECTOMY     child   CARDIAC CATHETERIZATION  10/2012   minimal nonobstructive CAD with normal LV function   CATARACT EXTRACTION W/ INTRAOCULAR LENS IMPLANT Bilateral    2009 approx   CYSTOSCOPY WITH LITHOLAPAXY N/A 07/13/2016   Procedure: CYSTOSCOPY WITH LITHOLAPAXY;  Surgeon: Franchot Gallo, MD;  Location: [facility];  Service: Urology;  Laterality: N/A;   CYSTOSCOPY WITH LITHOLAPAXY N/A 12/01/2021   Procedure: CYSTOSCOPY WITH LITHOLAPAXY;  Surgeon: Franchot Gallo, MD;  Location: [facility];  Service: Urology;  Laterality: N/A;   CYSTOSCOPY/RETROGRADE/URETEROSCOPY/STONE EXTRACTION WITH BASKET  02/24/2012   Procedure: CYSTOSCOPY/RETROGRADE/URETEROSCOPY/STONE EXTRACTION WITH BASKET;  Surgeon: Franchot Gallo, MD;  Location: [facility];  Service: Urology;  Laterality:  Bilateral;  1 HOUR   EXTRACORPOREAL SHOCK WAVE LITHOTRIPSY  11/16/2011   @[facility]    GASTRECTOMY     1970s  for ulcers   HEMORRHOID SURGERY     yrs ago   HOLMIUM LASER APPLICATION N/A 8/41/3244   Procedure: HOLMIUM LASER APPLICATION;  Surgeon: Franchot Gallo, MD;  Location: [facility];  Service: Urology;  Laterality: N/A;   INGUINAL HERNIA REPAIR Left 07/13/2016   Procedure: REPAIR LEFT  INGUINAL HERNIA;  Surgeon: Armandina Gemma, MD;  Location: [facility];  Service: General;  Laterality: Left;   INSERTION OF MESH Left 07/13/2016   Procedure: INSERTION OF MESH;  Surgeon: Armandina Gemma, MD;  Location: [facility];  Service: General;  Laterality: Left;   MELANOMA EXCISION Left 05/05/2007   @[facility]  ;   LEFT FOREARM   TONSILLECTOMY     child   TOTAL HIP ARTHROPLASTY Left 02/11/2021   Procedure: TOTAL HIP ARTHROPLASTY;  Surgeon: Marchia Bond, MD;  Location: [facility];  Service: Orthopedics;  Laterality: Left;   TOTAL SHOULDER ARTHROPLASTY Right 12/29/2017   Procedure: TOTAL SHOULDER ARTHROPLASTY;  Surgeon: Hiram Gash, MD;  Location: [place];  Service: Orthopedics;  Laterality: Right;    Medication: Current Facility-Administered Medications  Medication Dose Route Frequency Provider Last Rate Last Admin   0.9 %  sodium chloride infusion   Intravenous Continuous Nita Sells, MD 125 mL/hr at 01/04/22 1212 New Bag at 01/04/22 1212   acetaminophen (TYLENOL) tablet 500-1,000 mg  500-1,000 mg Oral BID PRN Nita Sells, MD       Derrill Memo ON 01/05/2022] losartan (COZAAR) tablet 12.5 mg  12.5 mg Oral Daily Nita Sells, MD       meropenem (MERREM) 1 g in sodium chloride 0.9 % 100 mL IVPB  1 g Intravenous Q8H Samtani, Jai-Gurmukh, MD       metoCLOPramide (REGLAN) injection 5 mg  5 mg Intravenous Q8H Magod, Altamese Dilling, MD       metoprolol tartrate (LOPRESSOR) tablet 25 mg  25 mg Oral BID Chandrasekhar, Mahesh A, MD   25 mg at 01/04/22 1125   [START ON 01/07/2022] pantoprazole (PROTONIX) injection 40 mg  40 mg Intravenous Q12H Samtani, Jai-Gurmukh, MD       pantoprozole (PROTONIX) 80 mg /NS 100 mL infusion  8 mg/hr Intravenous Continuous Nita Sells, MD 10 mL/hr at 01/04/22 0145 8 mg/hr at 01/04/22 0145   polyvinyl alcohol (LIQUIFILM TEARS) 1.4 % ophthalmic solution 1 drop  1 drop Both Eyes QHS Samtani, Jai-Gurmukh, MD       prochlorperazine (COMPAZINE) injection 10 mg  10 mg  Intravenous Q6H PRN Nita Sells, MD   10 mg at 01/04/22 0827   tamsulosin (FLOMAX) capsule 0.4 mg  0.4 mg Oral Daily Nita Sells, MD   0.4 mg at 01/04/22 1125    Allergies: Allergies  Allergen Reactions   Aspirin Other (See Comments)    UPSET STOMACH   Percocet [Oxycodone-Acetaminophen] Nausea And Vomiting   Vitamins [Apatate] Other (See Comments)    Bothers stomach    Social History: Social History   Tobacco Use   Smoking status: Former    Packs/day: 0.10    Years: 4.00    Pack years: 0.40    Types: Cigarettes    Quit date: 09/03/1956    Years since quitting: 65.3   Smokeless tobacco: Never  Vaping Use   Vaping Use: Never used  Substance Use Topics   Alcohol use: No    Alcohol/week: 0.0 standard drinks   Drug use: Never  Family History Family History  Problem Relation Age of Onset   Heart disease Mother        CABG in her 1s   Pneumonia Father    Cancer Sister        breast   Lung cancer Brother        smoker    Review of Systems 10 systems were reviewed and are negative except as noted specifically in the HPI.  Objective   Vital signs in last 24 hours: BP (!) 159/70 (BP Location: Right Arm)    Pulse 87    Temp 98.2 F (36.8 C) (Oral)    Resp 18    Ht 6' (1.829 m)    Wt 77.1 kg    SpO2 98%    BMI 23.06 kg/m   Physical Exam General: NAD, A&O, resting, appropriate HEENT: [place]/AT, EOMI, MMM Pulmonary: Normal work of breathing Cardiovascular: HDS, adequate peripheral perfusion Abdomen: Soft, nondistended.  Mild suprapubic tenderness. GU: Currently voiding spontaneously. Extremities: warm and well perfused Neuro: Appropriate, no focal neurological deficits  Most Recent Labs: Lab Results  Component Value Date   WBC 11.7 (H) 01/04/2022   HGB 14.9 01/04/2022   HCT 45.7 01/04/2022   PLT 235 01/04/2022    Lab Results  Component Value Date   NA 139 01/04/2022   K 3.8 01/04/2022   CL 103 01/04/2022   CO2 26 01/04/2022   BUN 23  01/04/2022   CREATININE 0.84 01/04/2022   CALCIUM 9.0 01/04/2022   MG 2.0 09/03/2012    Lab Results  Component Value Date   INR 1.2 01/04/2022   APTT 32 09/03/2012     Urine Culture: @[id number] (laburin,org,[id number])@   IMAGING: CT ABDOMEN PELVIS W CONTRAST  Result Date: 01/03/2022 CLINICAL DATA:  Acute abdominal pain, nausea vomiting. EXAM: CT ABDOMEN AND PELVIS WITH CONTRAST TECHNIQUE: Multidetector CT imaging of the abdomen and pelvis was performed using the standard protocol following bolus administration of intravenous contrast. RADIATION DOSE REDUCTION: This exam was performed according to the departmental dose-optimization program which includes automated exposure control, adjustment of the mA and/or kV according to patient size and/or use of iterative reconstruction technique. CONTRAST:  184mL OMNIPAQUE IOHEXOL 300 MG/ML  SOLN COMPARISON:  None. FINDINGS: Lower chest: No acute abnormality. Subsegmental atelectasis and fine subpleural reticulation at the lung bases, right greater than left. Hepatobiliary: No focal liver abnormality is seen. No gallstones, gallbladder wall thickening, or biliary dilatation. Pancreas: Diffuse moderate parenchymal volume loss and fatty infiltration. No pancreatic ductal dilatation or surrounding inflammatory changes. Spleen: Normal in size without focal abnormality. Adrenals/Urinary Tract: Adrenal glands are unremarkable. Multiple subcentimeter bilateral simple cortical renal cysts. Nonobstructive calyceal stones are noted bilaterally measuring up to 0.5 cm at the lower pole of the right kidney (series 2, image 28). No hydronephrosis or perinephric fat stranding. Mild dilatation of the right ureter, slightly increased compared to 01/01/2022, with subtle mucosal hyperenhancement (series 4, images 72-77). There is an abrupt caliber change of the ureter as it crosses the iliac vessels (series 2, images 53-59). The urinary bladder is mildly distended with a few  tiny locules of air within the lumen. Multiple bladder diverticuli are noted. Stomach/Bowel: Postoperative changes at the gastroesophageal junction. Distal gastrectomy with gastrojejunostomy. Diverticulosis coli. No evidence of bowel wall thickening, obstruction, or other inflammatory changes. Vascular/Lymphatic: Aortic atherosclerosis. No enlarged abdominal or pelvic lymph nodes. Reproductive: Enlarged prostate extends into the inferior aspect of the urinary bladder. Other: No abdominal wall hernia or abnormality. No abdominopelvic ascites. Musculoskeletal:  Multilevel moderate to advanced degenerative disc disease throughout the visualized distal thoracic spine and lumbar spine. No acute osseous abnormality. No suspicious osseous findings. Left hip prosthesis is intact. IMPRESSION: 1. Mild dilatation of the right ureter with mucosal hyperenhancement, as seen on recent CT 01/01/2022, suggestive of urinary tract infection. Recommend correlation with urinalysis. 2. Stable appearance of the urinary bladder with scattered locules of air, presumed to be related to prior instrumentation. 3. Bilateral nonobstructive nephrolithiasis. 4. Prostatomegaly. 5. Colonic diverticulosis without diverticulitis. 6.  Aortic Atherosclerosis (ICD10-I70.0). Electronically Signed   By: Ileana Roup M.D.   On: 01/03/2022 16:41   DG Chest Portable 1 View  Result Date: 01/03/2022 CLINICAL DATA:  Coffee-ground emesis. EXAM: PORTABLE CHEST 1 VIEW COMPARISON:  02/21/2014 and prior chest radiographs FINDINGS: The cardiomediastinal silhouette is unremarkable. There is no evidence of focal airspace disease, pulmonary edema, suspicious pulmonary nodule/mass, pleural effusion, or pneumothorax. No acute bony abnormalities are identified. RIGHT shoulder arthroplasty surgical hardware noted. Surgical clips overlying the UPPER abdomen are again noted. IMPRESSION: No active disease. Electronically Signed   By: Margarette Canada M.D.   On: 01/03/2022 13:52     ------  Assessment:  87 y.o. male with past medical history of coronary artery disease, paroxysmal A-fib, GERD, hypertension, hyperlipidemia, squamous cell carcinoma and melanoma, notably of BPH with poor emptying and bladder stones most recently treated by Dr. Diona Fanti in late January with cystolitholopaxy presenting with hematochezia, UTI, and Atrial fibrillation  Remains in A Fib. UA concerning for infection. Ucx pending. CT with moderately distanded bladder, urothelial enhancement of bladder and right ureter with dilation of right ureter (Chronic) and non obstructing right renal stone.   I recommended to the primary team treating him empirically for UTI while urine culture is pending and tailoring antibiotics per culture data.  I also recommended obtaining bladder scans to ensure the patient is emptying adequately.  If he is not emptying adequately and remains in atrial fibrillation, may benefit from Foley catheter placement and urinary tract decompression while antibiotics take effect.  In this case we will arrange for outpatient follow-up and either trial of void or discussion of a more definitive bladder outlet procedure.  Recommendations: -Recommend placing a Foley catheter for bladder decompression, bladder rest, adequate treatment of urinary tract infection without significant urinary stasis in the meantime -Recommend complicated UTI course tailored per urine culture data -Would be reasonable to obtain a renal ultrasound 36 to 48 hours after Foley catheter placement to ensure resolution of right-sided hydroureter -We will arrange follow-up with Dr. Diona Fanti   Thank you for this consult. Please contact the urology consult pager with any further questions/concerns.  I reviewed history and physical examination of the patient and discussed the patients management with the resident.  I reviewed the resident's note and agree with the documented findings and plan of care.

## 2022-01-05 ENCOUNTER — Inpatient Hospital Stay (HOSPITAL_COMMUNITY): Payer: Medicare Other

## 2022-01-05 DIAGNOSIS — I4891 Unspecified atrial fibrillation: Secondary | ICD-10-CM | POA: Diagnosis not present

## 2022-01-05 DIAGNOSIS — I480 Paroxysmal atrial fibrillation: Secondary | ICD-10-CM | POA: Diagnosis not present

## 2022-01-05 DIAGNOSIS — K920 Hematemesis: Secondary | ICD-10-CM | POA: Diagnosis not present

## 2022-01-05 LAB — COMPREHENSIVE METABOLIC PANEL
ALT: 15 U/L (ref 0–44)
AST: 18 U/L (ref 15–41)
Albumin: 3.6 g/dL (ref 3.5–5.0)
Alkaline Phosphatase: 53 U/L (ref 38–126)
Anion gap: 9 (ref 5–15)
BUN: 21 mg/dL (ref 8–23)
CO2: 27 mmol/L (ref 22–32)
Calcium: 8.8 mg/dL — ABNORMAL LOW (ref 8.9–10.3)
Chloride: 103 mmol/L (ref 98–111)
Creatinine, Ser: 0.88 mg/dL (ref 0.61–1.24)
GFR, Estimated: >60 mL/min (ref >60)
Glucose, Bld: 133 mg/dL — ABNORMAL HIGH (ref 70–99)
Potassium: 3.5 mmol/L (ref 3.5–5.1)
Sodium: 139 mmol/L (ref 135–145)
Total Bilirubin: 0.8 mg/dL (ref 0.3–1.2)
Total Protein: 6.8 g/dL (ref 6.5–8.1)

## 2022-01-05 LAB — ECHOCARDIOGRAM COMPLETE
AV Peak grad: 14.5 mmHg
Ao pk vel: 1.91 m/s
Area-P 1/2: 4.35 cm2
Height: 72 in
S' Lateral: 2.2 cm
Weight: 2720 oz

## 2022-01-05 LAB — CBC WITH DIFFERENTIAL/PLATELET
Abs Immature Granulocytes: 0.06 10*3/uL (ref 0.00–0.07)
Basophils Absolute: 0 10*3/uL (ref 0.0–0.1)
Basophils Relative: 0 %
Eosinophils Absolute: 0 10*3/uL (ref 0.0–0.5)
Eosinophils Relative: 0 %
HCT: 43.2 % (ref 39.0–52.0)
Hemoglobin: 14.5 g/dL (ref 13.0–17.0)
Immature Granulocytes: 1 %
Lymphocytes Relative: 8 %
Lymphs Abs: 0.9 10*3/uL (ref 0.7–4.0)
MCH: 32.2 pg (ref 26.0–34.0)
MCHC: 33.6 g/dL (ref 30.0–36.0)
MCV: 96 fL (ref 80.0–100.0)
Monocytes Absolute: 0.8 10*3/uL (ref 0.1–1.0)
Monocytes Relative: 6 %
Neutro Abs: 10.3 10*3/uL — ABNORMAL HIGH (ref 1.7–7.7)
Neutrophils Relative %: 85 %
Platelets: 206 10*3/uL (ref 150–400)
RBC: 4.5 MIL/uL (ref 4.22–5.81)
RDW: 12.1 % (ref 11.5–15.5)
WBC: 12 10*3/uL — ABNORMAL HIGH (ref 4.0–10.5)
nRBC: 0 % (ref 0.0–0.2)

## 2022-01-05 LAB — MAGNESIUM: Magnesium: 2.5 mg/dL — ABNORMAL HIGH (ref 1.7–2.4)

## 2022-01-05 LAB — TSH: TSH: 1.731 u[IU]/mL (ref 0.350–4.500)

## 2022-01-05 MED ORDER — METOCLOPRAMIDE HCL 5 MG/ML IJ SOLN
10.0000 mg | Freq: Three times a day (TID) | INTRAMUSCULAR | Status: DC
  Administered 2022-01-05 – 2022-01-09 (×12): 10 mg via INTRAVENOUS
  Filled 2022-01-05 (×12): qty 2

## 2022-01-05 MED ORDER — SUCRALFATE 1 G PO TABS
1.0000 g | ORAL_TABLET | Freq: Three times a day (TID) | ORAL | Status: DC
  Administered 2022-01-06 – 2022-01-09 (×11): 1 g via ORAL
  Filled 2022-01-05 (×14): qty 1

## 2022-01-05 NOTE — Progress Notes (Addendum)
[facility] Gastroenterology Progress Note  Joseph Simpson 87 y.o. 1935/07/08  CC:  Coffee ground emesis   Subjective: Patient states he had two more episodes of coffee ground emesis. States it was a minimal amount. Had one episode last night and one episode this morning. Denies melena/hematochezia. Denies nausea. Reports diffuse mild abdominal pain. Patient presented with new onset afib as well and is being followed by cardiology.  History of distal gastrectomy and gastrojejunostomy in the 1970s secondary to ulcer disease and history of gastroparesis currently on reglan 5mg  1-2 times daily.  ROS : Review of Systems  Gastrointestinal:  Positive for abdominal pain and vomiting. Negative for blood in stool, constipation, diarrhea, heartburn, melena and nausea.  Genitourinary:  Negative for dysuria and urgency.     Objective: Vital signs in last 24 hours: Vitals:   01/04/22 2046 01/05/22 0528  BP: 135/80 (!) 159/86  Pulse: 88 81  Resp: 17 17  Temp: 98.7 F (37.1 C) 97.9 F (36.6 C)  SpO2: 96% 97%    Physical Exam:  General:  Alert, cooperative, no distress  Head:  Normocephalic, without obvious abnormality, atraumatic  Eyes:  Anicteric sclera, EOM's intact  Lungs:   Clear to auscultation bilaterally, respirations unlabored  Heart:  Regular rate and rhythm, S1, S2 normal  Abdomen:   Soft, non-tender, bowel sounds active all four quadrants,  no masses,     Lab Results: Recent Labs    01/04/22 0355 01/05/22 0353  NA 139 139  K 3.8 3.5  CL 103 103  CO2 26 27  GLUCOSE 117* 133*  BUN 23 21  CREATININE 0.84 0.88  CALCIUM 9.0 8.8*  MG  --  2.5*   Recent Labs    01/04/22 0355 01/05/22 0353  AST 16 18  ALT 14 15  ALKPHOS 59 53  BILITOT 0.6 0.8  PROT 7.5 6.8  ALBUMIN 3.8 3.6   Recent Labs    01/04/22 0355 01/05/22 0353  WBC 11.7* 12.0*  NEUTROABS 10.2* 10.3*  HGB 14.9 14.5  HCT 45.7 43.2  MCV 95.6 96.0  PLT 235 206   Recent Labs    01/04/22 0355  LABPROT 14.8   INR 1.2      Assessment Coffee ground emesis: PUD vs esophagitis vs gastritis - hgb 14.5 - Leukocytosis with WBC 12.0 - BUN 21, Cr. 0.88 - CT ab/pelvis w contrast 2/18: diverticulosis, post ob changes in GE junction. S/p distal gastrectomy and gastrojejunostomy.  CAD - ECHO today pending  Plan: Stable hgb, no anemia at this time. If continued coffee ground emesis, may need to consider moving forward with EGD for further evaluation. Continue Protonix drip for another 24 hrs and then can consider transition to IV BID. Continue carafate 3 times daily Continue supportive care Continue daily CBC and transfuse as needed to maintain HGB > 7  [facility] will follow  Garnette Scheuermann PA-C 01/05/2022, 12:58 PM  Contact #  [phone]

## 2022-01-05 NOTE — Progress Notes (Incomplete)
Echocardiogram 2D Echocardiogram has been performed.  Joseph Simpson 01/05/2022, 12:42 PM

## 2022-01-05 NOTE — TOC Progression Note (Signed)
Transition of Care [facility]) - Progression Note    Patient Details  Name: WYLIE COON MRN: [id number] Date of Birth: 10-15-1935  Transition of Care [facility]) CM/SW Contact  Purcell Mouton, RN Phone Number: 01/05/2022, 3:05 PM  Clinical Narrative:    TOC Screening completed. Will continue to follow for discharge needs.   Expected Discharge Plan: [place] Barriers to Discharge: No Barriers Identified  Expected Discharge Plan and Services Expected Discharge Plan: [place]   Discharge Planning Services: CM Consult Post Acute Care Choice: [place] arrangements for the past 2 months: Single Family Home                                       Social Determinants of Health (SDOH) Interventions    Readmission Risk Interventions No flowsheet data found.

## 2022-01-05 NOTE — Progress Notes (Addendum)
Progress Note  Patient Name: Joseph Simpson Date of Encounter: 01/05/2022  [facility] Cardiologist: Peter Martinique, MD   Subjective   Son at bedside. Patient denies any chest pain, sob, palpitations. Continues to have fatigue, nausea. On clear liquid diet, tolerating as of this AM.   Inpatient Medications    Scheduled Meds:  Chlorhexidine Gluconate Cloth  6 each Topical Daily   losartan  12.5 mg Oral Daily   metoCLOPramide (REGLAN) injection  5 mg Intravenous Q8H   metoprolol tartrate  25 mg Oral BID   [START ON 01/07/2022] pantoprazole  40 mg Intravenous Q12H   polyvinyl alcohol  1 drop Both Eyes QHS   tamsulosin  0.4 mg Oral Daily   Continuous Infusions:  sodium chloride 125 mL/hr at 01/05/22 0514   meropenem (MERREM) IV 1 g (01/05/22 0518)   pantoprazole 8 mg/hr (01/05/22 0129)   PRN Meds: acetaminophen, prochlorperazine   Vital Signs    Vitals:   01/04/22 0757 01/04/22 1420 01/04/22 2046 01/05/22 0528  BP: (!) 159/70 119/80 135/80 (!) 159/86  Pulse: 87 91 88 81  Resp: 18 18 17 17   Temp: 98.2 F (36.8 C) 98.3 F (36.8 C) 98.7 F (37.1 C) 97.9 F (36.6 C)  TempSrc: Oral Oral Oral Oral  SpO2: 98% 92% 96% 97%  Weight:      Height:        Intake/Output Summary (Last 24 hours) at 01/05/2022 1010 Last data filed at 01/05/2022 8466 Gross per 24 hour  Intake 3105.96 ml  Output 2573 ml  Net 532.96 ml   Last 3 Weights 01/03/2022 12/23/2021 12/03/2021  Weight (lbs) 170 lb 171 lb 174 lb  Weight (kg) 77.111 kg 77.565 kg 78.926 kg      Telemetry    Atrial fibrillation, HR in the 80s-90s - Personally Reviewed  ECG    No new tracings - Personally Reviewed  Physical Exam   GEN: No acute distress.   Neck: No JVD Cardiac: Irregular rate and rhythm, grade 2/6 systolic murmur at LUSB Respiratory: Clear to auscultation bilaterally. GI: Soft, nontender, non-distended  MS: No edema; No deformity. Neuro:  Nonfocal. Oriented to person, place, and time  Psych:  Normal affect   Labs    High Sensitivity Troponin:  No results for input(s): TROPONINIHS in the last 720 hours.   Chemistry Recent Labs  Lab 01/03/22 1048 01/04/22 0355 01/05/22 0353  NA 138 139 139  K 4.7 3.8 3.5  CL 99 103 103  CO2 29 26 27   GLUCOSE 134* 117* 133*  BUN 31* 23 21  CREATININE 1.07 0.84 0.88  CALCIUM 9.1 9.0 8.8*  MG  --   --  2.5*  PROT 8.1 7.5 6.8  ALBUMIN 4.0 3.8 3.6  AST 28 16 18   ALT 18 14 15   ALKPHOS 61 59 53  BILITOT 1.3* 0.6 0.8  GFRNONAA >60 >60 >60  ANIONGAP 10 10 9     Lipids No results for input(s): CHOL, TRIG, HDL, LABVLDL, LDLCALC, CHOLHDL in the last 168 hours.  Hematology Recent Labs  Lab 01/03/22 1048 01/03/22 1912 01/04/22 0355 01/05/22 0353  WBC 8.9  --  11.7* 12.0*  RBC 4.61  --  4.78 4.50  HGB 15.0 14.6 14.9 14.5  HCT 44.8 43.5 45.7 43.2  MCV 97.2  --  95.6 96.0  MCH 32.5  --  31.2 32.2  MCHC 33.5  --  32.6 33.6  RDW 12.1  --  12.1 12.1  PLT 216  --  235 206   Thyroid  Recent Labs  Lab 01/05/22 0353  TSH 1.731    BNPNo results for input(s): BNP, PROBNP in the last 168 hours.  DDimer No results for input(s): DDIMER in the last 168 hours.   Radiology    CT ABDOMEN PELVIS W CONTRAST  Result Date: 01/03/2022 CLINICAL DATA:  Acute abdominal pain, nausea vomiting. EXAM: CT ABDOMEN AND PELVIS WITH CONTRAST TECHNIQUE: Multidetector CT imaging of the abdomen and pelvis was performed using the standard protocol following bolus administration of intravenous contrast. RADIATION DOSE REDUCTION: This exam was performed according to the departmental dose-optimization program which includes automated exposure control, adjustment of the mA and/or kV according to patient size and/or use of iterative reconstruction technique. CONTRAST:  118mL OMNIPAQUE IOHEXOL 300 MG/ML  SOLN COMPARISON:  None. FINDINGS: Lower chest: No acute abnormality. Subsegmental atelectasis and fine subpleural reticulation at the lung bases, right greater than left.  Hepatobiliary: No focal liver abnormality is seen. No gallstones, gallbladder wall thickening, or biliary dilatation. Pancreas: Diffuse moderate parenchymal volume loss and fatty infiltration. No pancreatic ductal dilatation or surrounding inflammatory changes. Spleen: Normal in size without focal abnormality. Adrenals/Urinary Tract: Adrenal glands are unremarkable. Multiple subcentimeter bilateral simple cortical renal cysts. Nonobstructive calyceal stones are noted bilaterally measuring up to 0.5 cm at the lower pole of the right kidney (series 2, image 28). No hydronephrosis or perinephric fat stranding. Mild dilatation of the right ureter, slightly increased compared to 01/01/2022, with subtle mucosal hyperenhancement (series 4, images 72-77). There is an abrupt caliber change of the ureter as it crosses the iliac vessels (series 2, images 53-59). The urinary bladder is mildly distended with a few tiny locules of air within the lumen. Multiple bladder diverticuli are noted. Stomach/Bowel: Postoperative changes at the gastroesophageal junction. Distal gastrectomy with gastrojejunostomy. Diverticulosis coli. No evidence of bowel wall thickening, obstruction, or other inflammatory changes. Vascular/Lymphatic: Aortic atherosclerosis. No enlarged abdominal or pelvic lymph nodes. Reproductive: Enlarged prostate extends into the inferior aspect of the urinary bladder. Other: No abdominal wall hernia or abnormality. No abdominopelvic ascites. Musculoskeletal: Multilevel moderate to advanced degenerative disc disease throughout the visualized distal thoracic spine and lumbar spine. No acute osseous abnormality. No suspicious osseous findings. Left hip prosthesis is intact. IMPRESSION: 1. Mild dilatation of the right ureter with mucosal hyperenhancement, as seen on recent CT 01/01/2022, suggestive of urinary tract infection. Recommend correlation with urinalysis. 2. Stable appearance of the urinary bladder with scattered  locules of air, presumed to be related to prior instrumentation. 3. Bilateral nonobstructive nephrolithiasis. 4. Prostatomegaly. 5. Colonic diverticulosis without diverticulitis. 6.  Aortic Atherosclerosis (ICD10-I70.0). Electronically Signed   By: Ileana Roup M.D.   On: 01/03/2022 16:41   DG Chest Portable 1 View  Result Date: 01/03/2022 CLINICAL DATA:  Coffee-ground emesis. EXAM: PORTABLE CHEST 1 VIEW COMPARISON:  02/21/2014 and prior chest radiographs FINDINGS: The cardiomediastinal silhouette is unremarkable. There is no evidence of focal airspace disease, pulmonary edema, suspicious pulmonary nodule/mass, pleural effusion, or pneumothorax. No acute bony abnormalities are identified. RIGHT shoulder arthroplasty surgical hardware noted. Surgical clips overlying the UPPER abdomen are again noted. IMPRESSION: No active disease. Electronically Signed   By: Margarette Canada M.D.   On: 01/03/2022 13:52    Cardiac Studies     Patient Profile     87 y.o. male with HTN, PVCs, OSA notn on CPA, non-obstructive CAD by 2013 LHC previous amiodarone in the past (distant past for PVCs) who was seen 01/04/2022 for the evaluation of AF.  Assessment & Plan    Paroxysmal Atrial Fibrillation New systolic murmur  - Duration unknown  - CHADS-VASc 4  - On metoprolol 25 mg BID. HR well controlled per telemetry  - With patient's active GI bleed, unlikely that he will tolerate anticoagulation. Discussed reasoning and risk of worsening GI bleeding with patient and son - At this time, we will continue to focus on rate control. Will consider rhythm control if echo shows a significant and new decrease in EF  - Echocardiogram pending  - TSH within normal limits  Nonobstructive CAD HTN  - Patient had a LHC in 2013 that showed non-obstructive CAD  - Continue metoprolol, losartan  - Patient denies any chest pain   GI Bleed  S/p prior gastrectomy  - Coffee ground emesis, stable hemoglobin, three evaluations in the  past 2 months for bleeding diasthesis  - Managed per primary team, gastroenterology   OSA - Needs outpatient CPAP     For questions or updates, please contact [facility] Please consult [email or web address] for contact info under        Signed, Margie Billet, PA-C  01/05/2022, 10:10 AM    Personally seen and examined. Agree with APP above with the following comments: Briefly 87 yo M with concern for GI bleed, prior gastrectomy non obstructive CAD, and PAF Patient notes little sleep.  No CP, palpitations, SOB Exam notable for IRIR, persistent systolic murmur Labs notable for stable Hgb Rate controlled AF on telemetry Would recommend  - echo is ordered and pending - continue metoprolol - we have discussed elevated stroke risk off AC (or even ASA) - if he is able to tolerate anti-platelet therapy as outpatient, may be LAA-O candidate - potential 2/21 SO based on echo results  Rudean Haskell, MD [place]  [address] [place],  [id number] [phone]  11:19 AM

## 2022-01-05 NOTE — Progress Notes (Addendum)
PROGRESS NOTE   Joseph Simpson  ERX:[id number] DOB: Jul 31, 1935 DOA: 01/03/2022 PCP: Marin Olp, MD  Brief Narrative:  45 white male community dwelling-high-level function still drives motor bikes-quit smoking drinking age 87 Known history of nephrolithiasis Squamous cell CA and melanoma ? CAD with nonobstructive CAD 2013 -P Afib versus PVC foll Dr. P Martinique not on [facility] GERD/gastroparesis, HTN HLD Prior ? Bilroth in ? 2013 past for ulcer? colonoscopy 5 years prior?    Admitted with single episode coffee-ground emesis in the setting of recent ED visit 2/16 with hematochezia Also slightly more distant history removal multiple urolithiasis Dr. Diona Fanti 1/70  GI consulted, cardiology consulted for new onset A-fib  Hospital-Problem based course  ?  GI bleed,?  Erosive gastritis versus MW tear precipitant antibiotics?  Prior Billroth I 2/2 gastric ulcer in the remote past Hemoglobin very stable appreciate Dr. Watt Climes input Protonix gtt./transition to twice daily-increase reglan to 10 tid IV Start carafate ?  Endoscopy this week if fails conservative mngmt Nausea vomiting overnight secondary to possible gastroparesis, Billroth anatomy Nonbilious nonbloody--appears he takes Reglan at home which has been restarted He is keeping down clears now Compazine first choice-avoid Zofran given A-fib New onset A-fib in the setting of prior PVCs-CHADS2 score >4, has bled score >3 No anticoagulation at this time-rate control increased to Toprol 25 twice daily Echo pending-appreciate cardiology input--TSH wnl Ensure magnesium is replaced in a.m. BPH Urolithiasis status post intervention 11/2021  ESBL in urine currently Flomax 0.4 Meropenem started 2/19--? 5-7 D, ? PICC foley replaced and necessary as retaining 750 cc 2/19 OP urodynamics/removal and interval ? prostatectomy CAD medically managed based on nonobstructive cath 2013 Apparently aspirin intolerant-monitor Cut back losartan to 12.5  continue metoprolol 25 twice daily, as needed hydralazine 5 mg >160 BP Known squamous cell CA, prior melanoma Outpatient reevaluation Retinopathy NOS Continue Avastin injections as per prior protocol  DVT prophylaxis: SCD at this time Code Status: Full Family Communication: D/W at the bedside with d-I-l Disposition:  Status is: Inpatient Remains inpatient appropriate because: Need for endoscopy and rate control    Consultants:  [facility]  Procedures: None yet  Antimicrobials: Meropenem   Subjective:  Nauseous doesn't feel good 2 episodes vomit Taking reglan No cP no fever no chills No OOB yet since this am  Objective: Vitals:   01/04/22 0757 01/04/22 1420 01/04/22 2046 01/05/22 0528  BP: (!) 159/70 119/80 135/80 (!) 159/86  Pulse: 87 91 88 81  Resp: 18 18 17 17   Temp: 98.2 F (36.8 C) 98.3 F (36.8 C) 98.7 F (37.1 C) 97.9 F (36.6 C)  TempSrc: Oral Oral Oral Oral  SpO2: 98% 92% 96% 97%  Weight:      Height:        Intake/Output Summary (Last 24 hours) at 01/05/2022 1149 Last data filed at 01/05/2022 9509 Gross per 24 hour  Intake 3105.96 ml  Output 2573 ml  Net 532.96 ml    Filed Weights   01/03/22 1041  Weight: 77.1 kg    Examination:  looks younger than stated age EOMI NCAT Chest clear no rales rhonchi S1-S2 irregularly irregular-telemetry reviewed = A-fibin the 80's Abdomen soft nontender-foley placed 2/19 No lower extremity edema  Data Reviewed: personally reviewed   CBC    Component Value Date/Time   WBC 12.0 (H) 01/05/2022 0353   RBC 4.50 01/05/2022 0353   HGB 14.5 01/05/2022 0353   HCT 43.2 01/05/2022 0353   PLT 206 01/05/2022 0353   MCV 96.0  01/05/2022 0353   MCH 32.2 01/05/2022 0353   MCHC 33.6 01/05/2022 0353   RDW 12.1 01/05/2022 0353   LYMPHSABS 0.9 01/05/2022 0353   MONOABS 0.8 01/05/2022 0353   EOSABS 0.0 01/05/2022 0353   BASOSABS 0.0 01/05/2022 0353   CMP Latest Ref Rng & Units 01/05/2022  01/04/2022 01/03/2022  Glucose 70 - 99 mg/dL 133(H) 117(H) 134(H)  BUN 8 - 23 mg/dL 21 23 31(H)  Creatinine 0.61 - 1.24 mg/dL 0.88 0.84 1.07  Sodium 135 - 145 mmol/L 139 139 138  Potassium 3.5 - 5.1 mmol/L 3.5 3.8 4.7  Chloride 98 - 111 mmol/L 103 103 99  CO2 22 - 32 mmol/L 27 26 29   Calcium 8.9 - 10.3 mg/dL 8.8(L) 9.0 9.1  Total Protein 6.5 - 8.1 g/dL 6.8 7.5 8.1  Total Bilirubin 0.3 - 1.2 mg/dL 0.8 0.6 1.3(H)  Alkaline Phos 38 - 126 U/L 53 59 61  AST 15 - 41 U/L 18 16 28   ALT 0 - 44 U/L 15 14 18      Radiology Studies: CT ABDOMEN PELVIS W CONTRAST  Result Date: 01/03/2022 CLINICAL DATA:  Acute abdominal pain, nausea vomiting. EXAM: CT ABDOMEN AND PELVIS WITH CONTRAST TECHNIQUE: Multidetector CT imaging of the abdomen and pelvis was performed using the standard protocol following bolus administration of intravenous contrast. RADIATION DOSE REDUCTION: This exam was performed according to the departmental dose-optimization program which includes automated exposure control, adjustment of the mA and/or kV according to patient size and/or use of iterative reconstruction technique. CONTRAST:  172mL OMNIPAQUE IOHEXOL 300 MG/ML  SOLN COMPARISON:  None. FINDINGS: Lower chest: No acute abnormality. Subsegmental atelectasis and fine subpleural reticulation at the lung bases, right greater than left. Hepatobiliary: No focal liver abnormality is seen. No gallstones, gallbladder wall thickening, or biliary dilatation. Pancreas: Diffuse moderate parenchymal volume loss and fatty infiltration. No pancreatic ductal dilatation or surrounding inflammatory changes. Spleen: Normal in size without focal abnormality. Adrenals/Urinary Tract: Adrenal glands are unremarkable. Multiple subcentimeter bilateral simple cortical renal cysts. Nonobstructive calyceal stones are noted bilaterally measuring up to 0.5 cm at the lower pole of the right kidney (series 2, image 28). No hydronephrosis or perinephric fat stranding. Mild  dilatation of the right ureter, slightly increased compared to 01/01/2022, with subtle mucosal hyperenhancement (series 4, images 72-77). There is an abrupt caliber change of the ureter as it crosses the iliac vessels (series 2, images 53-59). The urinary bladder is mildly distended with a few tiny locules of air within the lumen. Multiple bladder diverticuli are noted. Stomach/Bowel: Postoperative changes at the gastroesophageal junction. Distal gastrectomy with gastrojejunostomy. Diverticulosis coli. No evidence of bowel wall thickening, obstruction, or other inflammatory changes. Vascular/Lymphatic: Aortic atherosclerosis. No enlarged abdominal or pelvic lymph nodes. Reproductive: Enlarged prostate extends into the inferior aspect of the urinary bladder. Other: No abdominal wall hernia or abnormality. No abdominopelvic ascites. Musculoskeletal: Multilevel moderate to advanced degenerative disc disease throughout the visualized distal thoracic spine and lumbar spine. No acute osseous abnormality. No suspicious osseous findings. Left hip prosthesis is intact. IMPRESSION: 1. Mild dilatation of the right ureter with mucosal hyperenhancement, as seen on recent CT 01/01/2022, suggestive of urinary tract infection. Recommend correlation with urinalysis. 2. Stable appearance of the urinary bladder with scattered locules of air, presumed to be related to prior instrumentation. 3. Bilateral nonobstructive nephrolithiasis. 4. Prostatomegaly. 5. Colonic diverticulosis without diverticulitis. 6.  Aortic Atherosclerosis (ICD10-I70.0). Electronically Signed   By: Ileana Roup M.D.   On: 01/03/2022 16:41   DG Chest Portable  1 View  Result Date: 01/03/2022 CLINICAL DATA:  Coffee-ground emesis. EXAM: PORTABLE CHEST 1 VIEW COMPARISON:  02/21/2014 and prior chest radiographs FINDINGS: The cardiomediastinal silhouette is unremarkable. There is no evidence of focal airspace disease, pulmonary edema, suspicious pulmonary  nodule/mass, pleural effusion, or pneumothorax. No acute bony abnormalities are identified. RIGHT shoulder arthroplasty surgical hardware noted. Surgical clips overlying the UPPER abdomen are again noted. IMPRESSION: No active disease. Electronically Signed   By: Margarette Canada M.D.   On: 01/03/2022 13:52     Scheduled Meds:  Chlorhexidine Gluconate Cloth  6 each Topical Daily   losartan  12.5 mg Oral Daily   metoCLOPramide (REGLAN) injection  5 mg Intravenous Q8H   metoprolol tartrate  25 mg Oral BID   [START ON 01/07/2022] pantoprazole  40 mg Intravenous Q12H   polyvinyl alcohol  1 drop Both Eyes QHS   tamsulosin  0.4 mg Oral Daily   Continuous Infusions:  sodium chloride 125 mL/hr at 01/05/22 0514   meropenem (MERREM) IV 1 g (01/05/22 0518)   pantoprazole 8 mg/hr (01/05/22 0129)     LOS: 2 days   Time spent: 4  Nita Sells, MD [facility] To contact the attending provider between 7A-7P or the covering provider during after hours 7P-7A, please log into the web site [email or web address] and access using universal [place] password for that web site. If you do not have the password, please call the hospital operator.  01/05/2022, 11:49 AM

## 2022-01-06 ENCOUNTER — Inpatient Hospital Stay (HOSPITAL_COMMUNITY): Payer: Medicare Other

## 2022-01-06 ENCOUNTER — Inpatient Hospital Stay (HOSPITAL_COMMUNITY): Payer: Medicare Other | Admitting: Anesthesiology

## 2022-01-06 ENCOUNTER — Encounter (HOSPITAL_COMMUNITY)
Admission: EM | Disposition: A | Discharge status: Home / Self Care | Payer: Self-pay | Source: Home / Self Care | Attending: Family Medicine

## 2022-01-06 ENCOUNTER — Encounter (HOSPITAL_COMMUNITY): Payer: Self-pay | Admitting: Family Medicine

## 2022-01-06 DIAGNOSIS — I480 Paroxysmal atrial fibrillation: Secondary | ICD-10-CM | POA: Diagnosis not present

## 2022-01-06 DIAGNOSIS — K2289 Other specified disease of esophagus: Secondary | ICD-10-CM

## 2022-01-06 DIAGNOSIS — K920 Hematemesis: Secondary | ICD-10-CM | POA: Diagnosis not present

## 2022-01-06 DIAGNOSIS — K209 Esophagitis, unspecified without bleeding: Secondary | ICD-10-CM

## 2022-01-06 DIAGNOSIS — R634 Abnormal weight loss: Secondary | ICD-10-CM

## 2022-01-06 DIAGNOSIS — K3189 Other diseases of stomach and duodenum: Secondary | ICD-10-CM

## 2022-01-06 HISTORY — PX: ESOPHAGOGASTRODUODENOSCOPY: SHX5428

## 2022-01-06 LAB — RENAL FUNCTION PANEL
Albumin: 3.8 g/dL (ref 3.5–5.0)
Anion gap: 11 (ref 5–15)
BUN: 16 mg/dL (ref 8–23)
CO2: 27 mmol/L (ref 22–32)
Calcium: 8.7 mg/dL — ABNORMAL LOW (ref 8.9–10.3)
Chloride: 100 mmol/L (ref 98–111)
Creatinine, Ser: 0.61 mg/dL (ref 0.61–1.24)
GFR, Estimated: >60 mL/min (ref >60)
Glucose, Bld: 121 mg/dL — ABNORMAL HIGH (ref 70–99)
Phosphorus: 3 mg/dL (ref 2.5–4.6)
Potassium: 3.2 mmol/L — ABNORMAL LOW (ref 3.5–5.1)
Sodium: 138 mmol/L (ref 135–145)

## 2022-01-06 LAB — MAGNESIUM: Magnesium: 2.4 mg/dL (ref 1.7–2.4)

## 2022-01-06 SURGERY — EGD (ESOPHAGOGASTRODUODENOSCOPY)
Anesthesia: Monitor Anesthesia Care

## 2022-01-06 MED ORDER — LIDOCAINE HCL (CARDIAC) PF 100 MG/5ML IV SOSY
PREFILLED_SYRINGE | INTRAVENOUS | Status: DC | PRN
Start: 2022-01-06 — End: 2022-01-06
  Administered 2022-01-06: 100 mg via INTRAVENOUS

## 2022-01-06 MED ORDER — PROSOURCE PLUS PO LIQD
30.0000 mL | Freq: Two times a day (BID) | ORAL | Status: DC
  Administered 2022-01-06 (×2): 30 mL via ORAL
  Filled 2022-01-06 (×5): qty 30

## 2022-01-06 MED ORDER — ONDANSETRON HCL 4 MG/2ML IJ SOLN
INTRAMUSCULAR | Status: DC | PRN
  Administered 2022-01-06: 4 mg via INTRAVENOUS

## 2022-01-06 MED ORDER — PROPOFOL 10 MG/ML IV BOLUS
INTRAVENOUS | Status: DC | PRN
  Administered 2022-01-06: 100 mg via INTRAVENOUS

## 2022-01-06 MED ORDER — ADULT MULTIVITAMIN W/MINERALS CH
1.0000 | ORAL_TABLET | Freq: Every day | ORAL | Status: DC
  Administered 2022-01-07: 1 via ORAL
  Filled 2022-01-06 (×3): qty 1

## 2022-01-06 MED ORDER — POTASSIUM CHLORIDE 10 MEQ/100ML IV SOLN
10.0000 meq | INTRAVENOUS | Status: AC
  Administered 2022-01-06 (×2): 10 meq via INTRAVENOUS
  Filled 2022-01-06: qty 100

## 2022-01-06 MED ORDER — POTASSIUM CHLORIDE 10 MEQ/100ML IV SOLN
10.0000 meq | INTRAVENOUS | Status: AC
  Administered 2022-01-06 (×2): 10 meq via INTRAVENOUS
  Filled 2022-01-06 (×3): qty 100

## 2022-01-06 MED ORDER — LACTATED RINGERS IV SOLN: INTRAVENOUS | Status: DC

## 2022-01-06 MED ORDER — SODIUM CHLORIDE 0.9 % IV SOLN: INTRAVENOUS | Status: DC

## 2022-01-06 MED ORDER — HYDRALAZINE HCL 20 MG/ML IJ SOLN
10.0000 mg | Freq: Four times a day (QID) | INTRAMUSCULAR | Status: DC | PRN
Start: 2022-01-06 — End: 2022-01-09

## 2022-01-06 MED ORDER — SUCCINYLCHOLINE CHLORIDE 200 MG/10ML IV SOSY
PREFILLED_SYRINGE | INTRAVENOUS | Status: DC | PRN
  Administered 2022-01-06: 100 mg via INTRAVENOUS

## 2022-01-06 MED ORDER — LACTATED RINGERS IV SOLN: INTRAVENOUS | Status: DC | PRN

## 2022-01-06 MED ORDER — BOOST / RESOURCE BREEZE PO LIQD CUSTOM
1.0000 | Freq: Two times a day (BID) | ORAL | Status: DC
  Administered 2022-01-06 – 2022-01-09 (×6): 1 via ORAL

## 2022-01-06 MED ORDER — PANTOPRAZOLE SODIUM 40 MG IV SOLR
40.0000 mg | Freq: Two times a day (BID) | INTRAVENOUS | Status: DC
  Administered 2022-01-06 – 2022-01-09 (×7): 40 mg via INTRAVENOUS
  Filled 2022-01-06 (×8): qty 10

## 2022-01-06 MED ORDER — MELATONIN 3 MG PO TABS
3.0000 mg | ORAL_TABLET | Freq: Every day | ORAL | Status: AC
  Administered 2022-01-06 – 2022-01-07 (×2): 3 mg via ORAL
  Filled 2022-01-06 (×2): qty 1

## 2022-01-06 NOTE — Anesthesia Postprocedure Evaluation (Signed)
Anesthesia Post Note  Patient: Joseph Simpson  Procedure(s) Performed: ESOPHAGOGASTRODUODENOSCOPY (EGD)     Patient location during evaluation: Endoscopy Anesthesia Type: MAC Level of consciousness: awake and alert Pain management: pain level controlled Vital Signs Assessment: post-procedure vital signs reviewed and stable Respiratory status: spontaneous breathing, nonlabored ventilation, respiratory function stable and patient connected to nasal cannula oxygen Cardiovascular status: blood pressure returned to baseline and stable Postop Assessment: no apparent nausea or vomiting Anesthetic complications: no   No notable events documented.  Last Vitals:  Vitals:   01/06/22 1052 01/06/22 1102  BP: (!) 145/83 (!) 149/71  Pulse: 96 83  Resp: 19 18  Temp:    SpO2: 94% 94%    Last Pain:  Vitals:   01/06/22 1102  TempSrc:   PainSc: 0-No pain                 Addis Tuohy,Marrio DANIEL

## 2022-01-06 NOTE — Progress Notes (Signed)
Progress Note  Patient Name: Joseph Simpson Date of Encounter: 01/06/2022  [facility] Cardiologist: Peter Martinique, MD   Subjective   Granddaughter at bedside. Has not has improvement in sx. S/p EGD: no clear culprit for [place] appears to be found. No CP, SOB, Palpitations.    Inpatient Medications    Scheduled Meds:  Chlorhexidine Gluconate Cloth  6 each Topical Daily   losartan  12.5 mg Oral Daily   metoCLOPramide (REGLAN) injection  10 mg Intravenous Q8H   metoprolol tartrate  25 mg Oral BID   pantoprazole (PROTONIX) IV  40 mg Intravenous Q12H   polyvinyl alcohol  1 drop Both Eyes QHS   sucralfate  1 g Oral TID WC & HS   tamsulosin  0.4 mg Oral Daily   Continuous Infusions:  sodium chloride 125 mL/hr at 01/06/22 1157   meropenem (MERREM) IV 1 g (01/06/22 0533)   potassium chloride 10 mEq (01/06/22 1206)   PRN Meds: acetaminophen, hydrALAZINE, prochlorperazine   Vital Signs    Vitals:   01/06/22 1042 01/06/22 1052 01/06/22 1102 01/06/22 1146  BP: (!) 159/89 (!) 145/83 (!) 149/71 (!) 156/95  Pulse: 93 96 83 95  Resp: 16 19 18 20   Temp: 98.2 F (36.8 C)     TempSrc: Temporal     SpO2: 98% 94% 94% 98%  Weight:      Height:        Intake/Output Summary (Last 24 hours) at 01/06/2022 1210 Last data filed at 01/06/2022 1048 Gross per 24 hour  Intake 3018.72 ml  Output 2350 ml  Net 668.72 ml   Last 3 Weights 01/03/2022 12/23/2021 12/03/2021  Weight (lbs) 170 lb 171 lb 174 lb  Weight (kg) 77.111 kg 77.565 kg 78.926 kg      Telemetry    Atrial fibrillation, < 110 bpm- Personally Reviewed  ECG    No new tracings - Personally Reviewed  Physical Exam   GEN: No acute distress.   Neck: No JVD Cardiac: Irregular rate and rhythm, grade 2/6 systolic murmur at LUSB Respiratory: Clear to auscultation bilaterally. GI: Soft, nontender, non-distended  MS: No edema; No deformity. Neuro:  Nonfocal. Oriented to person, place, and time  Psych: Normal affect   Labs     High Sensitivity Troponin:  No results for input(s): TROPONINIHS in the last 720 hours.   Chemistry Recent Labs  Lab 01/03/22 1048 01/04/22 0355 01/05/22 0353 01/06/22 0354  NA 138 139 139 138  K 4.7 3.8 3.5 3.2*  CL 99 103 103 100  CO2 29 26 27 27   GLUCOSE 134* 117* 133* 121*  BUN 31* 23 21 16   CREATININE 1.07 0.84 0.88 0.61  CALCIUM 9.1 9.0 8.8* 8.7*  MG  --   --  2.5* 2.4  PROT 8.1 7.5 6.8  --   ALBUMIN 4.0 3.8 3.6 3.8  AST 28 16 18   --   ALT 18 14 15   --   ALKPHOS 61 59 53  --   BILITOT 1.3* 0.6 0.8  --   GFRNONAA >60 >60 >60 >60  ANIONGAP 10 10 9 11     Lipids No results for input(s): CHOL, TRIG, HDL, LABVLDL, LDLCALC, CHOLHDL in the last 168 hours.  Hematology Recent Labs  Lab 01/03/22 1048 01/03/22 1912 01/04/22 0355 01/05/22 0353  WBC 8.9  --  11.7* 12.0*  RBC 4.61  --  4.78 4.50  HGB 15.0 14.6 14.9 14.5  HCT 44.8 43.5 45.7 43.2  MCV 97.2  --  95.6 96.0  MCH 32.5  --  31.2 32.2  MCHC 33.5  --  32.6 33.6  RDW 12.1  --  12.1 12.1  PLT 216  --  235 206   Thyroid  Recent Labs  Lab 01/05/22 0353  TSH 1.731    BNPNo results for input(s): BNP, PROBNP in the last 168 hours.  DDimer No results for input(s): DDIMER in the last 168 hours.   Radiology    US RENAL  Result Date: 01/06/2022 CLINICAL DATA:  Hydronephrosis EXAM: RENAL / URINARY TRACT ULTRASOUND COMPLETE COMPARISON:  CT 01/03/2022 FINDINGS: Right Kidney: Renal measurements: 11.3 x 5.0 x 5.8 cm = volume: 202.2 mL. There is a 9 mm calculus in the lower pole the right kidney. No hydronephrosis. Tiny renal cyst. Left Kidney: Renal measurements: 12.6 x 5.9 x 6.7 cm = volume: 259.3 mL. Subcentimeter simple appearing renal cyst. No hydronephrosis. No nephrolithiasis. Bladder: Decompressed with Foley catheter in place. Other: Enlarged prostate. IMPRESSION: No hydronephrosis. Renal calculus in the right lower pole measuring 9 mm. Enlarged prostate. Electronically Signed   By: Maurine Simmering M.D.   On: 01/06/2022  08:23   ECHOCARDIOGRAM COMPLETE  Result Date: 01/05/2022    ECHOCARDIOGRAM REPORT   Patient Name:   Joseph Simpson Date of Exam: 01/05/2022 Medical Rec #:  [id number]      Height:       72.0 in Accession #:    [id number]     Weight:       170.0 lb Date of Birth:  11-Aug-1935       BSA:          1.988 m Patient Age:    87 years       BP:           159/86 mmHg Patient Gender: M              HR:           81 bpm. Exam Location:  Inpatient Procedure: 2D Echo Indications:    AFIB  History:        Patient has prior history of Echocardiogram examinations, most                 recent 04/16/2009. CAD, Arrythmias:Atrial Fibrillation; Risk                 Factors:Hypertension.  Sonographer:    Jefferey Pica Referring Phys: [id number] Gross  1. Left ventricular ejection fraction, by estimation, is 60 to 65%. The left ventricle has normal function. The left ventricle has no regional wall motion abnormalities. Left ventricular diastolic function could not be evaluated.  2. Right ventricular systolic function is normal. The right ventricular size is normal. There is normal pulmonary artery systolic pressure.  3. Left atrial size was mildly dilated.  4. The mitral valve is normal in structure. Mild mitral valve regurgitation. No evidence of mitral stenosis.  5. The aortic valve is normal in structure. There is mild calcification of the aortic valve. There is mild thickening of the aortic valve. Aortic valve regurgitation is not visualized. FINDINGS  Left Ventricle: Left ventricular ejection fraction, by estimation, is 60 to 65%. The left ventricle has normal function. The left ventricle has no regional wall motion abnormalities. The left ventricular internal cavity size was normal in size. There is  no left ventricular hypertrophy. Left ventricular diastolic function could not be evaluated due to atrial fibrillation. Left ventricular diastolic function could not be evaluated. Right  Ventricle: The right  ventricular size is normal. No increase in right ventricular wall thickness. Right ventricular systolic function is normal. There is normal pulmonary artery systolic pressure. The tricuspid regurgitant velocity is 2.62 m/s, and  with an assumed right atrial pressure of 5 mmHg, the estimated right ventricular systolic pressure is 27.0 mmHg. Left Atrium: Left atrial size was mildly dilated. Right Atrium: Right atrial size was normal in size. Pericardium: There is no evidence of pericardial effusion. Mitral Valve: The mitral valve is normal in structure. Mild mitral valve regurgitation. No evidence of mitral valve stenosis. Tricuspid Valve: The tricuspid valve is grossly normal. Tricuspid valve regurgitation is trivial. Aortic Valve: The aortic valve is normal in structure. There is mild calcification of the aortic valve. There is mild thickening of the aortic valve. Aortic valve regurgitation is not visualized. Aortic valve peak gradient measures 14.5 mmHg. Pulmonic Valve: The pulmonic valve was not well visualized. Pulmonic valve regurgitation is not visualized. Aorta: The aortic root and ascending aorta are structurally normal, with no evidence of dilitation. IAS/Shunts: The atrial septum is grossly normal.  LEFT VENTRICLE PLAX 2D LVIDd:         3.80 cm LVIDs:         2.20 cm LV PW:         1.10 cm LV IVS:        1.10 cm  RIGHT VENTRICLE             IVC RV Basal diam:  3.10 cm     IVC diam: 1.90 cm RV S prime:     17.30 cm/s TAPSE (M-mode): 2.3 cm LEFT ATRIUM           Index        RIGHT ATRIUM           Index LA diam:      3.90 cm 1.96 cm/m   RA Area:     11.40 cm LA Vol (A2C): 30.3 ml 15.24 ml/m  RA Volume:   23.50 ml  11.82 ml/m LA Vol (A4C): 75.1 ml 37.77 ml/m  AORTIC VALVE               PULMONIC VALVE AV Vmax:      190.60 cm/s  PV Vmax:       1.02 m/s AV Peak Grad: 14.5 mmHg    PV Peak grad:  4.2 mmHg LVOT Vmax:    163.00 cm/s LVOT Vmean:   103.000 cm/s LVOT VTI:     0.262 m  AORTA Ao Root diam: 3.90 cm Ao  Asc diam:  3.80 cm MITRAL VALVE               TRICUSPID VALVE MV Area (PHT): 4.35 cm    TR Peak grad:   27.5 mmHg MV Decel Time: 175 msec    TR Vmax:        262.00 cm/s MV E velocity: 92.45 cm/s                            SHUNTS                            Systemic VTI: 0.26 m Mertie Moores MD Electronically signed by Mertie Moores MD Signature Date/Time: 01/05/2022/2:56:10 PM    Final     Cardiac Studies     Patient Profile     87 y.o. male with HTN,  PVCs, OSA notn on CPA, non-obstructive CAD by 2013 LHC previous amiodarone in the past (distant past for PVCs) who was seen 01/04/2022 for the evaluation of AF.   Assessment & Plan    Paroxysmal Atrial Fibrillation Aortic valve sclerosis with out stenosis - Duration unknown  - CHADS-VASc 4  - On metoprolol 25 mg BID. HR well controlled - TSH within normal limits - see prior notes, no plans for [facility] challenge this admission; we have discusses risk and benefits at length and with resolution of hematuria and GI issues that he has dealt with 2023, this can be reconsidered as outpatient - no plans for AS eval at this time based on recent echo  Nonobstructive CAD HTN  - Patient had a LHC in 2013 that showed non-obstructive CAD  - Continue metoprolol, losartan  - have assisted with PRN BP orders  GI Bleed  S/p prior gastrectomy  - Managed per primary team, gastroenterology   OSA - Needs outpatient CPAP     For questions or updates, please contact [facility] Please consult [email or web address] for contact info under     [place] will sign off.   Medication Recommendations:  metoprolol 25 mg PO BID Other recommendations (labs, testing, etc):  NA Follow up as an outpatient:  has 01/26/22 follow up visit with Dr. Martinique  [address], [place]  [place] [place] [place],  [id number] [phone]  12:15 PM

## 2022-01-06 NOTE — Op Note (Signed)
[facility] Patient Name: Joseph Simpson Procedure Date: 01/06/2022 MRN: [id number] Attending MD: Arta Silence , MD Date of Birth: [birth year] CSN: [id number] Age: 87 Admit Type: Inpatient Procedure:                Upper GI endoscopy Indications:              Nausea with vomiting, Weight loss Providers:                Arta Silence, MD, Dulcy Fanny, Cletis [place],                            Technician, Stephanie [place] CRNA Referring MD:             [facility] Medicines:                General Anesthesia Complications:            No immediate complications. Estimated Blood Loss:     Estimated blood loss: none. Procedure:                Pre-Anesthesia Assessment:                           - Prior to the procedure, a History and Physical                            was performed, and patient medications and                            allergies were reviewed. The patient's tolerance of                            previous anesthesia was also reviewed. The risks                            and benefits of the procedure and the sedation                            options and risks were discussed with the patient.                            All questions were answered, and informed consent                            was obtained. Prior Anticoagulants: The patient has                            taken no previous anticoagulant or antiplatelet                            agents. ASA Grade Assessment: III - A patient with                            severe systemic disease. After reviewing the risks  and benefits, the patient was deemed in                            satisfactory condition to undergo the procedure.                           After obtaining informed consent, the endoscope was                            passed under direct vision. Throughout the                            procedure, the patient's blood pressure, pulse, and                             oxygen saturations were monitored continuously. The                            [id number]-H190 ([id number]) Olympus endoscope was introduced                            through the mouth, and advanced to the efferent                            jejunal loop. The upper GI endoscopy was                            accomplished without difficulty. The patient                            tolerated the procedure well. Scope In: Scope Out: Findings:      LA Grade D (one or more mucosal breaks involving at least 75% of       esophageal circumference) esophagitis was found.      Diffuse moderate mucosal changes characterized by congestion were found       in the entire esophagus. There was significant mucous build up in distal       esophagus, without food impaction.      The exam of the esophagus was otherwise normal.      Bilroth-II anatomy noted.      Diffuse moderately erythematous mucosa without bleeding was found in the       entire examined remnant stomach. Some gastric ulcerations and edema       noted throughout gastric remnant, especially near gastrojejunostomy       anastomosis.      The exam of the stomach was otherwise normal. While edematous, no       evidence of stricturing at gastrojejunostomy anastomosis, and endoscope       passed into efferent/afferent jejunal limbs without difficulty.      The examined distal 5 cm and proximal 5 cm of afferent and efferent       jejunal limbs were normal, respectively. Impression:               - LA Grade D esophagitis.                           -  Congested mucosa in the esophagus.                           - Erythematous mucosa in the stomach.                           - Normal examined jejunum.                           - Symptoms highly likely multifactorial. Certainly                            severe esophagitis can cause these symptoms; mucous                            build up in esophagus also raises possibility of                             some type of esophageal motility disorder. Moderate Sedation:      Not Applicable - Patient had care per Anesthesia. Recommendation:           - Return patient to hospital ward for ongoing care.                           - Clear liquid diet today.                           - PPI, carafate.                           [facility] will follow. Procedure Code(s):        --- Professional ---                           [phone], Esophagogastroduodenoscopy, flexible,                            transoral; diagnostic, including collection of                            specimen(s) by brushing or washing, when performed                            (separate procedure) Diagnosis Code(s):        --- Professional ---                           K20.90, Esophagitis, unspecified without bleeding                           K22.8, Other specified diseases of esophagus                           K31.89, Other diseases of stomach and duodenum                           R11.2, Nausea  with vomiting, unspecified                           R63.4, Abnormal weight loss CPT copyright 2019 American Medical Association. All rights reserved. The codes documented in this report are preliminary and upon coder review may  be revised to meet current compliance requirements. Arta Silence, MD 01/06/2022 10:57:32 AM This report has been signed electronically. Number of Addenda: 0

## 2022-01-06 NOTE — Anesthesia Procedure Notes (Signed)
Procedure Name: Intubation Date/Time: 01/06/2022 10:19 AM Performed by: [place] Teairra Millar C, CRNA Pre-anesthesia Checklist: Patient identified, Emergency Drugs available, Suction available and Patient being monitored Patient Re-evaluated:Patient Re-evaluated prior to induction Oxygen Delivery Method: Circle system utilized Preoxygenation: Pre-oxygenation with 100% oxygen Induction Type: IV induction, Cricoid Pressure applied and Rapid sequence Laryngoscope Size: Mac and 4 Grade View: Grade I Tube type: Oral Tube size: 7.0 mm Number of attempts: 1 Airway Equipment and Method: Stylet and Oral airway Placement Confirmation: ETT inserted through vocal cords under direct vision, positive ETCO2 and breath sounds checked- equal and bilateral Secured at: 22 cm Tube secured with: Tape Dental Injury: Teeth and Oropharynx as per pre-operative assessment

## 2022-01-06 NOTE — Progress Notes (Signed)
Initial Nutrition Assessment  DOCUMENTATION CODES:   Non-severe (moderate) malnutrition in context of chronic illness  INTERVENTION:  - will order Boost Breeze BID, each supplement provides 250 kcal and 9 grams of protein. - will Will order 30 ml Prosource Plus BID, each supplement provides 100 kcal and 15 grams protein.  - will order 1 tablet Multivitamin with minerals daily - will check iron/anemia panel and serum vitamin C (cleared by MD). - diet advancement as medically feasible.   NUTRITION DIAGNOSIS:   Moderate Malnutrition related to chronic illness as evidenced by moderate fat depletion, moderate muscle depletion, severe muscle depletion.   GOAL:   Patient will meet greater than or equal to 90% of their needs  MONITOR:   PO intake, Supplement acceptance, Diet advancement, Labs, Weight trends  REASON FOR ASSESSMENT:   Malnutrition Screening Tool  ASSESSMENT:   87 year-old male with medical history of GERD, HTN, dyslipidemia, macular degeneration, CAD, OSA, hiatal hernia, arthritis, vitamin B12 deficiency, squamous cell carcinoma, melanoma, and gastroparesis. Patient had 40 bladder stones removed a month PTA. After catheter was removed he had difficulty urinating and it had to be replaced. He was then dx with a UTI. Patient presented to the ED d/t N/V and admitted for possible hematemesis with concern for Mallory-Weiss tear and thought of abx-induced nausea.  Patient out of the room to Endoscopy at the time of first visit. Patient's granddaughter was in the room at that time and provided information. She shared that patient had not had solid food in ~1 week and that he was experiencing vomiting with all PO intakes, including water.  Able to see and talk with patient after he returned from Endoscopy. Report note from EGD indicates findings of LA grade esophagitis, congested mucosa in the esophagus, and erythematous mucosa in the stomach. Note indicates question of esophageal  dysmotility.  He reports that he last had solid food ~1.5 weeks ago and that he had a veggie plate at that time. He reports intermittent N/V over the past 1.5 weeks and that he does feel nauseated before vomiting. Often has these sensations even when he has not consumed something PO. He has been feeling weak with movement, no dizziness.  At home he does not take vitamin or mineral supplements other than monthly B12 injection; last injection was in January.  Noted white patches on entirety of tongue. Patient denies any sensation of something being on his tongue. He denies tongue, oral, or esophageal pain. He denies abdominal or back pain.   Noted that patient has dx of gastroparesis. Granddaughter able to call a family member who reports that patient received this dx 40 years ago.   Patient shares that he is unable to eat and drink at the same time or it will cause him to cough and/or vomit. This has been ongoing for a long time.  Patient does report that he has had episodes similar to current/what brought him into the hospital in the past when he has had gastric ulcers. He and granddaughter think last episode was ~15 years ago. He reports that episodes would last for ~1 week.   Patient does not weigh himself at home but reports that he has lost 10-15 lb in the past 1 month. Weight on 2/18 was 170 lb (appears to be a stated weight) and weight on 12/01/21 was 174 lb. This indicates 4 lb weight loss (2.3% body weight) in the past 1 month.    Labs reviewed; BUN: 25 mg/dl, creatinine: 1.32 mg/dl, GFR:  53 ml/min.  Medications reviewed; 10 mg IV reglan TID, 40 mg IV protonix BID, 1 g carafate TID.  IVF; NS @ 125 ml/hr.    NUTRITION - FOCUSED PHYSICAL EXAM:  Flowsheet Row Most Recent Value  Orbital Region Mild depletion  Upper Arm Region Moderate depletion  Thoracic and Lumbar Region Unable to assess  Buccal Region Mild depletion  Temple Region Moderate depletion  Clavicle Bone Region Severe  depletion  Clavicle and Acromion Bone Region Severe depletion  Scapular Bone Region Moderate depletion  Dorsal Hand Severe depletion  Patellar Region Severe depletion  Anterior Thigh Region Severe depletion  Posterior Calf Region Moderate depletion  Edema (RD Assessment) None  Hair Reviewed  Eyes Reviewed  Mouth Reviewed  [white patches on tongue]  Skin Reviewed  Nails Reviewed       Diet Order:   Diet Order             Diet clear liquid Room service appropriate? Yes; Fluid consistency: Thin  Diet effective now                   EDUCATION NEEDS:   Education needs have been addressed  Skin:  Skin Assessment: Reviewed RN Assessment  Last BM:     Height:   Ht Readings from Last 1 Encounters:  01/03/22 6' (1.829 m)    Weight:   Wt Readings from Last 1 Encounters:  01/03/22 77.1 kg     BMI:  Body mass index is 23.06 kg/m.  Estimated Nutritional Needs:  Kcal:  1800-2000 kcal Protein:  90-105 grams Fluid:  >/= 2.2 L/day     Jarome Matin, MS, RD, LDN Inpatient Clinical Dietitian RD pager # available in [place]  After hours/weekend pager # available in [facility]

## 2022-01-06 NOTE — Anesthesia Preprocedure Evaluation (Addendum)
Anesthesia Evaluation  Patient identified by MRN, date of birth, ID band Patient awake    Reviewed: Allergy & Precautions, NPO status , Patient's Chart, lab work & pertinent test results  History of Anesthesia Complications Negative for: history of anesthetic complications  Airway Mallampati: I  TM Distance: >3 FB Neck ROM: Full    Dental  (+) Edentulous Upper, Edentulous Lower, Dental Advisory Given   Pulmonary sleep apnea and Continuous Positive Airway Pressure Ventilation , former smoker,    Pulmonary exam normal        Cardiovascular hypertension, Pt. on medications + dysrhythmias Atrial Fibrillation  Rhythm:Irregular Rate:Tachycardia  IMPRESSIONS    1. Left ventricular ejection fraction, by estimation, is 60 to 65%. The  left ventricle has normal function. The left ventricle has no regional  wall motion abnormalities. Left ventricular diastolic function could not  be evaluated.  2. Right ventricular systolic function is normal. The right ventricular  size is normal. There is normal pulmonary artery systolic pressure.  3. Left atrial size was mildly dilated.  4. The mitral valve is normal in structure. Mild mitral valve  regurgitation. No evidence of mitral stenosis.  5. The aortic valve is normal in structure. There is mild calcification  of the aortic valve. There is mild thickening of the aortic valve. Aortic  valve regurgitation is not visualized.    Neuro/Psych negative neurological ROS  negative psych ROS   GI/Hepatic Neg liver ROS, GERD  Medicated,  Endo/Other  negative endocrine ROS  Renal/GU negative Renal ROS  negative genitourinary   Musculoskeletal negative musculoskeletal ROS (+)   Abdominal   Peds negative pediatric ROS (+)  Hematology negative hematology ROS (+)   Anesthesia Other Findings   Reproductive/Obstetrics negative OB ROS                             Anesthesia Physical  Anesthesia Plan  ASA: 3  Anesthesia Plan: General   Post-op Pain Management: Minimal or no pain anticipated   Induction: Rapid sequence, Cricoid pressure planned and Intravenous  PONV Risk Score and Plan: 2 and Ondansetron, Treatment may vary due to age or medical condition and Propofol infusion  Airway Management Planned: Oral ETT  Additional Equipment:   Intra-op Plan:   Post-operative Plan: Extubation in OR  Informed Consent: I have reviewed the patients History and Physical, chart, labs and discussed the procedure including the risks, benefits and alternatives for the proposed anesthesia with the patient or authorized representative who has indicated his/her understanding and acceptance.     Dental advisory given  Plan Discussed with: Anesthesiologist and CRNA  Anesthesia Plan Comments: (Dr. Paulita Fujita concerned that this could be a food impaction: will require GA/RSI.)      Anesthesia Quick Evaluation

## 2022-01-06 NOTE — Interval H&P Note (Signed)
History and Physical Interval Note:  01/06/2022 10:07 AM  Joseph Simpson  has presented today for surgery, with the diagnosis of coffee ground emesis, nausea and vomiting, weightloss.  The various methods of treatment have been discussed with the patient and family. After consideration of risks, benefits and other options for treatment, the patient has consented to  Procedure(s): ESOPHAGOGASTRODUODENOSCOPY (EGD) (N/A) as a surgical intervention.  The patient's history has been reviewed, patient examined, no change in status, stable for surgery.  I have reviewed the patient's chart and labs.  Questions were answered to the patient's satisfaction.     Landry Dyke

## 2022-01-06 NOTE — Progress Notes (Signed)
PROGRESS NOTE   [name]  DTO:[id number] DOB: [birth year] DOA: 01/03/2022 PCP: Marin Olp, MD  Brief Narrative:  76 white male community dwelling-high-level function still drives motor bikes-quit smoking drinking age 87 Known history of nephrolithiasis Squamous cell CA and melanoma ? CAD with nonobstructive CAD 2013 -P Afib versus PVC foll Dr. P [place] not on [facility] GERD/gastroparesis, HTN HLD Prior ? Bilroth in ? 2013 past for ulcer? colonoscopy 5 years prior?    Admitted with single episode coffee-ground emesis in the setting of recent ED visit 2/16 with hematochezia Also slightly more distant history removal multiple urolithiasis Dr. Diona Fanti 1/70  GI consulted, cardiology consulted for new onset A-fib  Hospital-Problem based course  ?  GI bleed,?  Erosive gastritis versus MW tear precipitant antibiotics?  Prior Billroth I 2/2 gastric ulcer in the remote past Appreciate insights from GI-going for endoscopy 2/21 -Reglan at 10 every 8 hourly, Carafate 1 g 4 times daily, Protonix transition to twice daily IV 2/22 Await results of endoscopy possible gastroparesis, Billroth anatomy Compazine first choice-avoid Zofran given A-fib New onset A-fib in the setting of prior PVCs-CHADS2 score >4, has bled score >3 No anticoagulation at this time-rate control increased to Toprol 25 twice daily Echo pending-appreciate cardiology input--TSH wnl. BPH Urolithiasis status post intervention 11/2021  ESBL in urine currently Flomax 0.4 Meropenem started 2/19--> Stop date 2/23 foley replaced and necessary as retaining 750 cc 2/19 OP urodynamics/removal and interval ? Prostatectomy as an OP as per Urologist-[ESBL needs to clear prior to surgery] CAD medically managed based on nonobstructive cath 2013 Apparently aspirin intolerant-monitor Cut back losartan to 12.5 continue metoprolol 25 twice daily Known squamous cell CA, prior melanoma Outpatient reevaluation Retinopathy NOS Continue  Avastin injections as per prior protocol  DVT prophylaxis: SCD at this time Code Status: Full Family Communication: D/W at the bedside with son Disposition:  Status is: Inpatient Remains inpatient appropriate because:   Need for endoscopy , needs to complete his meropenem    Consultants:  [facility]  Procedures: None yet  Antimicrobials: Meropenem   Subjective:  Doing fair-still feels uncomfortable in his stomach N.p.o. for procedure Son at [employer] chat about next steps  Objective: Vitals:   01/05/22 0528 01/05/22 1349 01/05/22 2103 01/06/22 0600  BP: (!) 159/86 119/72 (!) 152/99 (!) 159/87  Pulse: 81 97 89 89  Resp: 17 18 18 16   Temp: 97.9 F (36.6 C) 98.1 F (36.7 C) 99.6 F (37.6 C) 98.7 F (37.1 C)  TempSrc: Oral Oral Oral   SpO2: 97%  97% 99%  Weight:      Height:        Intake/Output Summary (Last 24 hours) at 01/06/2022 0844 Last data filed at 01/06/2022 0841 Gross per 24 hour  Intake 2718.72 ml  Output 1350 ml  Net 1368.72 ml    Filed Weights   01/03/22 1041  Weight: 77.1 kg    Examination:  looks younger than stated age EOMI NCAT Chest clear no rales rhonchi S1-S2 irregularly irregular-telemetry reviewed = A-fibin the 90 Abdomen soft nontender-foley placed 2/19 [needs to remain indwelling till sees urologist] No lower extremity edema  Data Reviewed: personally reviewed   CBC    Component Value Date/Time   WBC 12.0 (H) 01/05/2022 0353   RBC 4.50 01/05/2022 0353   HGB 14.5 01/05/2022 0353   HCT 43.2 01/05/2022 0353   PLT 206 01/05/2022 0353   MCV 96.0 01/05/2022 0353   MCH 32.2 01/05/2022 0353   MCHC 33.6 01/05/2022  0353   RDW 12.1 01/05/2022 0353   LYMPHSABS 0.9 01/05/2022 0353   MONOABS 0.8 01/05/2022 0353   EOSABS 0.0 01/05/2022 0353   BASOSABS 0.0 01/05/2022 0353   CMP Latest Ref Rng & Units 01/06/2022 01/05/2022 01/04/2022  Glucose 70 - 99 mg/dL 121(H) 133(H) 117(H)  BUN 8 - 23 mg/dL 16 21 23    Creatinine 0.61 - 1.24 mg/dL 0.61 0.88 0.84  Sodium 135 - 145 mmol/L 138 139 139  Potassium 3.5 - 5.1 mmol/L 3.2(L) 3.5 3.8  Chloride 98 - 111 mmol/L 100 103 103  CO2 22 - 32 mmol/L 27 27 26   Calcium 8.9 - 10.3 mg/dL 8.7(L) 8.8(L) 9.0  Total Protein 6.5 - 8.1 g/dL - 6.8 7.5  Total Bilirubin 0.3 - 1.2 mg/dL - 0.8 0.6  Alkaline Phos 38 - 126 U/L - 53 59  AST 15 - 41 U/L - 18 16  ALT 0 - 44 U/L - 15 14     Radiology Studies: US RENAL  Result Date: 01/06/2022 CLINICAL DATA:  Hydronephrosis EXAM: RENAL / URINARY TRACT ULTRASOUND COMPLETE COMPARISON:  CT 01/03/2022 FINDINGS: Right Kidney: Renal measurements: 11.3 x 5.0 x 5.8 cm = volume: 202.2 mL. There is a 9 mm calculus in the lower pole the right kidney. No hydronephrosis. Tiny renal cyst. Left Kidney: Renal measurements: 12.6 x 5.9 x 6.7 cm = volume: 259.3 mL. Subcentimeter simple appearing renal cyst. No hydronephrosis. No nephrolithiasis. Bladder: Decompressed with Foley catheter in place. Other: Enlarged prostate. IMPRESSION: No hydronephrosis. Renal calculus in the right lower pole measuring 9 mm. Enlarged prostate. Electronically Signed   By: Joseph Simpson M.D.   On: 01/06/2022 08:23   ECHOCARDIOGRAM COMPLETE  Result Date: 01/05/2022    ECHOCARDIOGRAM REPORT   Patient Name:   Joseph Simpson Wolbert Date of Exam: 01/05/2022 Medical Rec #:  [id number]      Height:       72.0 in Accession #:    [id number]     Weight:       170.0 lb Date of Birth:  [birth year]       BSA:          1.988 m Patient Age:    87 years       BP:           159/86 mmHg Patient Gender: M              HR:           81 bpm. Exam Location:  Inpatient Procedure: 2D Echo Indications:    AFIB  History:        Patient has prior history of Echocardiogram examinations, most                 recent 04/16/2009. CAD, Arrythmias:Atrial Fibrillation; Risk                 Factors:Hypertension.  Sonographer:    Joseph Simpson Referring Phys: [id number] [place]  1. Left ventricular  ejection fraction, by estimation, is 60 to 65%. The left ventricle has normal function. The left ventricle has no regional wall motion abnormalities. Left ventricular diastolic function could not be evaluated.  2. Right ventricular systolic function is normal. The right ventricular size is normal. There is normal pulmonary artery systolic pressure.  3. Left atrial size was mildly dilated.  4. The mitral valve is normal in structure. Mild mitral valve regurgitation. No evidence of mitral stenosis.  5. The aortic valve is normal in  structure. There is mild calcification of the aortic valve. There is mild thickening of the aortic valve. Aortic valve regurgitation is not visualized. FINDINGS  Left Ventricle: Left ventricular ejection fraction, by estimation, is 60 to 65%. The left ventricle has normal function. The left ventricle has no regional wall motion abnormalities. The left ventricular internal cavity size was normal in size. There is  no left ventricular hypertrophy. Left ventricular diastolic function could not be evaluated due to atrial fibrillation. Left ventricular diastolic function could not be evaluated. Right Ventricle: The right ventricular size is normal. No increase in right ventricular wall thickness. Right ventricular systolic function is normal. There is normal pulmonary artery systolic pressure. The tricuspid regurgitant velocity is 2.62 m/s, and  with an assumed right atrial pressure of 5 mmHg, the estimated right ventricular systolic pressure is 62.1 mmHg. Left Atrium: Left atrial size was mildly dilated. Right Atrium: Right atrial size was normal in size. Pericardium: There is no evidence of pericardial effusion. Mitral Valve: The mitral valve is normal in structure. Mild mitral valve regurgitation. No evidence of mitral valve stenosis. Tricuspid Valve: The tricuspid valve is grossly normal. Tricuspid valve regurgitation is trivial. Aortic Valve: The aortic valve is normal in structure. There  is mild calcification of the aortic valve. There is mild thickening of the aortic valve. Aortic valve regurgitation is not visualized. Aortic valve peak gradient measures 14.5 mmHg. Pulmonic Valve: The pulmonic valve was not well visualized. Pulmonic valve regurgitation is not visualized. Aorta: The aortic root and ascending aorta are structurally normal, with no evidence of dilitation. IAS/Shunts: The atrial septum is grossly normal.  LEFT VENTRICLE PLAX 2D LVIDd:         3.80 cm LVIDs:         2.20 cm LV PW:         1.10 cm LV IVS:        1.10 cm  RIGHT VENTRICLE             IVC RV Basal diam:  3.10 cm     IVC diam: 1.90 cm RV S prime:     17.30 cm/s TAPSE (M-mode): 2.3 cm LEFT ATRIUM           Index        RIGHT ATRIUM           Index LA diam:      3.90 cm 1.96 cm/m   RA Area:     11.40 cm LA Vol (A2C): 30.3 ml 15.24 ml/m  RA Volume:   23.50 ml  11.82 ml/m LA Vol (A4C): 75.1 ml 37.77 ml/m  AORTIC VALVE               PULMONIC VALVE AV Vmax:      190.60 cm/s  PV Vmax:       1.02 m/s AV Peak Grad: 14.5 mmHg    PV Peak grad:  4.2 mmHg LVOT Vmax:    163.00 cm/s LVOT Vmean:   103.000 cm/s LVOT VTI:     0.262 m  AORTA Ao Root diam: 3.90 cm Ao Asc diam:  3.80 cm MITRAL VALVE               TRICUSPID VALVE MV Area (PHT): 4.35 cm    TR Peak grad:   27.5 mmHg MV Decel Time: 175 msec    TR Vmax:        262.00 cm/s MV E velocity: 92.45 cm/s  SHUNTS                            Systemic VTI: 0.26 m Mertie Moores MD Electronically signed by Mertie Moores MD Signature Date/Time: 01/05/2022/2:56:10 PM    Final      Scheduled Meds:  Chlorhexidine Gluconate Cloth  6 each Topical Daily   losartan  12.5 mg Oral Daily   metoCLOPramide (REGLAN) injection  10 mg Intravenous Q8H   metoprolol tartrate  25 mg Oral BID   [START ON 01/07/2022] pantoprazole  40 mg Intravenous Q12H   polyvinyl alcohol  1 drop Both Eyes QHS   sucralfate  1 g Oral TID WC & HS   tamsulosin  0.4 mg Oral Daily   Continuous  Infusions:  sodium chloride 125 mL/hr at 01/06/22 0228   meropenem (MERREM) IV 1 g (01/06/22 0533)   pantoprazole 8 mg/hr (01/06/22 0013)   potassium chloride       LOS: 3 days   Time spent: 42  Nita Sells, MD [facility] To contact the attending provider between 7A-7P or the covering provider during after hours 7P-7A, please log into the web site [email or web address] and access using universal [place] password for that web site. If you do not have the password, please call the hospital operator.  01/06/2022, 8:44 AM

## 2022-01-06 NOTE — Transfer of Care (Signed)
Immediate Anesthesia Transfer of Care Note  Patient: ASHTEN PRATS  Procedure(s) Performed: ESOPHAGOGASTRODUODENOSCOPY (EGD)  Patient Location: PACU and Endoscopy Unit  Anesthesia Type:General  Level of Consciousness: awake and drowsy  Airway & Oxygen Therapy: Patient Spontanous Breathing and Patient connected to face mask oxygen  Post-op Assessment: Report given to RN and Post -op Vital signs reviewed and stable  Post vital signs: Reviewed and stable  Last Vitals:  Vitals Value Taken Time  BP 159/89 01/06/22 1042  Temp    Pulse 95 01/06/22 1047  Resp 18 01/06/22 1047  SpO2 94 % 01/06/22 1047  Vitals shown include unvalidated device data.  Last Pain:  Vitals:   01/06/22 1042  TempSrc:   PainSc: Asleep      Patients Stated Pain Goal: 0 (75/17/00 1749)  Complications: No notable events documented.

## 2022-01-07 ENCOUNTER — Encounter (HOSPITAL_COMMUNITY): Payer: Self-pay | Admitting: Gastroenterology

## 2022-01-07 DIAGNOSIS — K922 Gastrointestinal hemorrhage, unspecified: Secondary | ICD-10-CM

## 2022-01-07 DIAGNOSIS — I4891 Unspecified atrial fibrillation: Secondary | ICD-10-CM

## 2022-01-07 DIAGNOSIS — I10 Essential (primary) hypertension: Secondary | ICD-10-CM | POA: Diagnosis not present

## 2022-01-07 DIAGNOSIS — N390 Urinary tract infection, site not specified: Secondary | ICD-10-CM | POA: Diagnosis present

## 2022-01-07 DIAGNOSIS — E440 Moderate protein-calorie malnutrition: Secondary | ICD-10-CM

## 2022-01-07 DIAGNOSIS — N400 Enlarged prostate without lower urinary tract symptoms: Secondary | ICD-10-CM | POA: Diagnosis not present

## 2022-01-07 LAB — CBC WITH DIFFERENTIAL/PLATELET
Abs Immature Granulocytes: 0.06 10*3/uL (ref 0.00–0.07)
Basophils Absolute: 0 10*3/uL (ref 0.0–0.1)
Basophils Relative: 0 %
Eosinophils Absolute: 0 10*3/uL (ref 0.0–0.5)
Eosinophils Relative: 0 %
HCT: 42.2 % (ref 39.0–52.0)
Hemoglobin: 14.5 g/dL (ref 13.0–17.0)
Immature Granulocytes: 1 %
Lymphocytes Relative: 6 %
Lymphs Abs: 0.6 10*3/uL — ABNORMAL LOW (ref 0.7–4.0)
MCH: 32 pg (ref 26.0–34.0)
MCHC: 34.4 g/dL (ref 30.0–36.0)
MCV: 93.2 fL (ref 80.0–100.0)
Monocytes Absolute: 0.8 10*3/uL (ref 0.1–1.0)
Monocytes Relative: 7 %
Neutro Abs: 9.6 10*3/uL — ABNORMAL HIGH (ref 1.7–7.7)
Neutrophils Relative %: 86 %
Platelets: 163 10*3/uL (ref 150–400)
RBC: 4.53 MIL/uL (ref 4.22–5.81)
RDW: 11.9 % (ref 11.5–15.5)
WBC: 11.1 10*3/uL — ABNORMAL HIGH (ref 4.0–10.5)
nRBC: 0 % (ref 0.0–0.2)

## 2022-01-07 LAB — IRON AND TIBC
Iron: 82 ug/dL (ref 45–182)
Saturation Ratios: 34 % (ref 17.9–39.5)
TIBC: 241 ug/dL — ABNORMAL LOW (ref 250–450)
UIBC: 159 ug/dL

## 2022-01-07 LAB — COMPREHENSIVE METABOLIC PANEL
ALT: 17 U/L (ref 0–44)
AST: 19 U/L (ref 15–41)
Albumin: 3.1 g/dL — ABNORMAL LOW (ref 3.5–5.0)
Alkaline Phosphatase: 51 U/L (ref 38–126)
Anion gap: 7 (ref 5–15)
BUN: 18 mg/dL (ref 8–23)
CO2: 29 mmol/L (ref 22–32)
Calcium: 8.3 mg/dL — ABNORMAL LOW (ref 8.9–10.3)
Chloride: 99 mmol/L (ref 98–111)
Creatinine, Ser: 0.66 mg/dL (ref 0.61–1.24)
GFR, Estimated: >60 mL/min (ref >60)
Glucose, Bld: 129 mg/dL — ABNORMAL HIGH (ref 70–99)
Potassium: 3.3 mmol/L — ABNORMAL LOW (ref 3.5–5.1)
Sodium: 135 mmol/L (ref 135–145)
Total Bilirubin: 1.2 mg/dL (ref 0.3–1.2)
Total Protein: 6.1 g/dL — ABNORMAL LOW (ref 6.5–8.1)

## 2022-01-07 LAB — VITAMIN B12: Vitamin B-12: 1040 pg/mL — ABNORMAL HIGH (ref 180–914)

## 2022-01-07 LAB — FOLATE: Folate: 6.9 ng/mL (ref >5.9)

## 2022-01-07 LAB — FERRITIN: Ferritin: 322 ng/mL (ref 24–336)

## 2022-01-07 MED ORDER — FLUTICASONE PROPIONATE 50 MCG/ACT NA SUSP
1.0000 | Freq: Two times a day (BID) | NASAL | Status: DC
  Administered 2022-01-07 – 2022-01-09 (×5): 1 via NASAL
  Filled 2022-01-07: qty 16

## 2022-01-07 MED ORDER — POTASSIUM CHLORIDE CRYS ER 20 MEQ PO TBCR
40.0000 meq | EXTENDED_RELEASE_TABLET | Freq: Once | ORAL | Status: AC
  Administered 2022-01-07: 40 meq via ORAL
  Filled 2022-01-07: qty 2

## 2022-01-07 MED ORDER — SALINE SPRAY 0.65 % NA SOLN
1.0000 | NASAL | Status: DC | PRN
  Administered 2022-01-07: 1 via NASAL
  Filled 2022-01-07: qty 44

## 2022-01-07 NOTE — Progress Notes (Signed)
At 10 pm med pass RN noticed pt was having some trouble swallowing his metoprolol and melatonin.  With some follow up questions it has been found that he has been having some trouble swallowing his pills for some time.  May need swallow study as he is not showing outward signs of aspiration with thin liquids   Pt did not need PRN nausea meds through the night shift, at morning me pass RN asked about vomiting and he stated that he had just recently "vomited" but he said it was mucous.  PT may think he is vomiting when in actuality he is trying to clear the mucous and it is making him gag

## 2022-01-07 NOTE — Progress Notes (Signed)
°  Progress Note   Patient: Joseph Simpson VPX:[id number] DOB: Aug 22, 1935 DOA: 01/03/2022     4 DOS: the patient was seen and examined on 01/07/2022   Brief hospital course: 87 year old male with history of nephrolithiasis, squamous cell cancer and melanoma, nonobstructive CAD, GERD/gastroparesis, hypertension, hyperlipidemia, prior gastric surgery for ulcer presented with coffee-ground emesis.  GI was consulted.  He also was found to have new onset A-fib.  Cardiology was consulted.  Assessment and Plan: GI bleed- (present on admission) - Currently on IV Protonix twice a day along with oral Carafate. -GI following.  Status post EGD on 01/06/2022 which showed LA grade D esophagitis along with gastritis and gastric ulcerations.  Currently still on clear liquid diet.  Patient still intermittently nauseous with vomiting.  New onset a-fib [facility])- (present on admission) -Currently rate controlled.  Not on anticoagulation because of GI bleed. -Continue metoprolol.  Cardiology signed off.  Outpatient follow-up with cardiology.   UTI (urinary tract infection)- (present on admission) Urinary retention - Continue meropenem for ESBL in urine culture.  Stop date 01/08/2022. -Foley catheter was replaced.  Will need outpatient follow-up with urology for need for possible?  Prostatectomy  CAD (coronary artery disease)- (present on admission) - Patient had nonobstructive CAD as per cath in 2013 - Currently stable.  Continue medical management with metoprolol, losartan.  Outpatient follow-up with cardiology.  Essential hypertension- (present on admission) - Continue with metoprolol and losartan.  BPH (benign prostatic hyperplasia)- (present on admission) Continue Flomax and follow closely with Dr. Diona Fanti  Moderate malnutrition -Follow nutrition recommendations   Subjective:  Patient seen and examined at bedside.  Does not feel well.  Complains of nausea and intermittent vomiting.  No overnight fever,  chest pain or shortness of breath reported.  Physical Exam: Vitals:   01/06/22 2115 01/07/22 0412 01/07/22 1108 01/07/22 1213  BP: 132/77 133/89 126/72 122/75  Pulse: 85 73 71 77  Resp: 20 12  18   Temp:  (!) 97.5 F (36.4 C)  97.6 F (36.4 C)  TempSrc:  Oral  Oral  SpO2: 96% 97%  98%  Weight:      Height:       General: No acute distress, currently on room air ENT/neck: No elevated JVD.  No obvious masses  respiratory: Bilateral decreased breath sounds at bases with some scattered crackles CVS: S1-S2 heard, rate controlled Abdominal: Soft, nontender, nondistended, no organomegaly, bowel sounds heard Extremities: No cyanosis, clubbing, edema CNS: Alert, awake and oriented.  No focal neurologic deficit.  Moving extremities. Lymph: No cervical lymphadenopathy Skin: No rashes, lesions, ulcers Psych: Affect is mostly flat.  No signs of agitation. Musculoskeletal: No obvious joint deformity/tenderness/swelling   Data Reviewed: I have reviewed patient's investigations during this hospitalization myself.  Today's sodium is 135, potassium of 3.3, WBC of 11.1, hemoglobin of 14.5, platelets of 163   Family Communication: Son at bedside  Disposition: Status is: Inpatient Remains inpatient appropriate because: Severity of illness.  Need to advance diet.          Planned Discharge Destination: Home     Time spent: 50 minutes  Author: Aline August, MD 01/07/2022 1:04 PM  For on call review [email or web address].

## 2022-01-07 NOTE — Progress Notes (Signed)
End of shift note  Most of the "vomit" from the pt is mucus that he is coughing and gagging on.  Dr ordered saline spray, flonase and advanced diet to full liquid.  Iv replaced.  Pt feels better today than yesterday.  Anticipate discharge 2/24

## 2022-01-07 NOTE — Assessment & Plan Note (Addendum)
-   Currently on IV Protonix twice a day along with oral Carafate. -GI following.  Status post EGD on 01/06/2022 which showed LA grade D esophagitis along with gastritis and gastric ulcerations.   -Diet has been advanced and patient is currently tolerating it without vomiting. -Discharge patient home today on oral Protonix and Carafate.  Outpatient follow-up with GI.

## 2022-01-07 NOTE — Assessment & Plan Note (Addendum)
Urinary retention -Treated with meropenem for 5 days for ESBL in urine culture.  -Foley catheter placed during this hospitalization.  Foley catheter removed on 01/08/2022 and patient is urinating well. -Continue Flomax.  Outpatient follow-up with urology.

## 2022-01-07 NOTE — Assessment & Plan Note (Signed)
-   Continue with metoprolol and losartan.

## 2022-01-07 NOTE — Plan of Care (Signed)

## 2022-01-07 NOTE — Evaluation (Signed)
Physical Therapy Evaluation Patient Details Name: Joseph Simpson MRN: [id number] DOB: October 02, 1935 Today's Date: 01/07/2022  History of Present Illness  87 yo male admitted with N/V, UTI, Afib. Hx of CAD, L post hip 2022, BPH  Clinical Impression  On eval, pt was Min guard assist for mobility. He walked ~125 feet with a RW. He tolerated distance well. Will plan to follow during hospital stay. May not need HHPT f/u if he progresses well but I have recommended it at this time.        Recommendations for follow up therapy are one component of a multi-disciplinary discharge planning process, led by the attending physician.  Recommendations may be updated based on patient status, additional functional criteria and insurance authorization.  Follow Up Recommendations Home health PT (depending on progress)    Assistance Recommended at Discharge PRN  Patient can return home with the following  A little help with walking and/or transfers;A little help with bathing/dressing/bathroom;Help with stairs or ramp for entrance;Assistance with cooking/housework;Assist for transportation    Equipment Recommendations None recommended by PT  Recommendations for Other Services       Functional Status Assessment Patient has had a recent decline in their functional status and demonstrates the ability to make significant improvements in function in a reasonable and predictable amount of time.     Precautions / Restrictions Precautions Precautions: Fall Restrictions Weight Bearing Restrictions: No      Mobility  Bed Mobility               General bed mobility comments: oob in recliner    Transfers Overall transfer level: Needs assistance Equipment used: Rolling walker (2 wheels) Transfers: Sit to/from Stand Sit to Stand: Min guard           General transfer comment: Increased time. Cues for safety, hand placement    Ambulation/Gait Ambulation/Gait assistance: Min guard Gait Distance  (Feet): 125 Feet Assistive device: Rolling walker (2 wheels) Gait Pattern/deviations: Step-through pattern, Decreased stride length       General Gait Details: Min guard for safety. No LOB with RW use.  Stairs            Wheelchair Mobility    Modified Rankin (Stroke Patients Only)       Balance Overall balance assessment: Needs assistance         Standing balance support: Bilateral upper extremity supported, During functional activity, Reliant on assistive device for balance Standing balance-Leahy Scale: Fair                               Pertinent Vitals/Pain Pain Assessment Pain Assessment: No/denies pain    Home Living Family/patient expects to be discharged to:: Private residence Living Arrangements: Other relatives Available Help at Discharge: Family Type of Home: House Home Access: Stairs to enter Entrance Stairs-Rails: None Entrance Stairs-Number of Steps: 2   Home Layout: One level Home Equipment: Conservation officer, nature (2 wheels);Cane - single point;BSC/3in1      Prior Function Prior Level of Function : Independent/Modified Independent             Mobility Comments: used cane PRN outside       Hand Dominance        Extremity/Trunk Assessment   Upper Extremity Assessment Upper Extremity Assessment: Overall WFL for tasks assessed    Lower Extremity Assessment Lower Extremity Assessment: Generalized weakness    Cervical / Trunk Assessment Cervical / Trunk Assessment: Normal  Communication   Communication: No difficulties  Cognition Arousal/Alertness: Awake/alert Behavior During Therapy: WFL for tasks assessed/performed Overall Cognitive Status: Within Functional Limits for tasks assessed                                          General Comments      Exercises     Assessment/Plan    PT Assessment Patient needs continued PT services  PT Problem List Decreased strength;Decreased mobility;Decreased  activity tolerance;Decreased balance;Decreased knowledge of use of DME;Pain       PT Treatment Interventions DME instruction;Therapeutic exercise;Gait training;Balance training;Functional mobility training;Therapeutic activities;Patient/family education    PT Goals (Current goals can be found in the Care Plan section)  Acute Rehab PT Goals Patient Stated Goal: to regain PLOF/independence PT Goal Formulation: With patient Time For Goal Achievement: 01/21/22 Potential to Achieve Goals: Good    Frequency 7X/week     Co-evaluation               AM-PAC PT "6 Clicks" Mobility  Outcome Measure Help needed turning from your back to your side while in a flat bed without using bedrails?: A Little Help needed moving from lying on your back to sitting on the side of a flat bed without using bedrails?: A Little Help needed moving to and from a bed to a chair (including a wheelchair)?: A Little Help needed standing up from a chair using your arms (e.g., wheelchair or bedside chair)?: A Little Help needed to walk in hospital room?: A Little Help needed climbing 3-5 steps with a railing? : A Little 6 Click Score: 18    End of Session Equipment Utilized During Treatment: Gait belt Activity Tolerance: Patient tolerated treatment well Patient left: in chair;with call bell/phone within reach;with family/visitor present   PT Visit Diagnosis: Muscle weakness (generalized) (M62.81);Difficulty in walking, not elsewhere classified (R26.2)    Time: 1017-5102 PT Time Calculation (min) (ACUTE ONLY): 13 min   Charges:   PT Evaluation $PT Eval Low Complexity: [place], PT Acute Rehabilitation  Office: [phone] Pager: [phone]

## 2022-01-07 NOTE — Progress Notes (Signed)
[facility] Gastroenterology Progress Note  Joseph Simpson 87 y.o. [birth year]  CC:  Coffee-ground emesis, nausea/vomiting   Subjective: Patient's son at bedside. Patient states he feels weak today.  He reports vomiting once this morning, mostly phlegm.  Denies hematemesis or coffee-ground emesis.  States he was able to eat breakfast this morning and keep everything down. He is on a clear liquid diet. Denies nausea at the time of my visit.  Patient denies bowel movements since procedure yesterday.  Denies abdominal pain.  Per patient's nurse, his vomit this morning was really only phlegm, and she says he is having postnasal drip.   ROS : Review of Systems  Constitutional:  Negative for chills and fever.  Gastrointestinal:  Positive for vomiting (once this morning, phlegm). Negative for abdominal pain, blood in stool, constipation, diarrhea, heartburn, melena and nausea.  Genitourinary:  Negative for dysuria and urgency.    Objective: Vital signs in last 24 hours: Vitals:   01/07/22 0412 01/07/22 1108  BP: 133/89 126/72  Pulse: 73 71  Resp: 12   Temp: (!) 97.5 F (36.4 C)   SpO2: 97%     Physical Exam:  General:  Alert, cooperative, no distress  Head:  Normocephalic, without obvious abnormality, atraumatic  Eyes:  Anicteric sclera, EOM's intact  Lungs:   Clear to auscultation bilaterally, respirations unlabored  Heart:  Regular rate and rhythm, S1, S2 normal  Abdomen:   Soft, non-tender, bowel sounds active all four quadrants,  no masses,           Lab Results: Recent Labs    01/05/22 0353 01/06/22 0354 01/07/22 0407  NA 139 138 135  K 3.5 3.2* 3.3*  CL 103 100 99  CO2 27 27 29   GLUCOSE 133* 121* 129*  BUN 21 16 18   CREATININE 0.88 0.61 0.66  CALCIUM 8.8* 8.7* 8.3*  MG 2.5* 2.4  --   PHOS  --  3.0  --    Recent Labs    01/05/22 0353 01/06/22 0354 01/07/22 0407  AST 18  --  19  ALT 15  --  17  ALKPHOS 53  --  51  BILITOT 0.8  --  1.2  PROT 6.8  --  6.1*   ALBUMIN 3.6 3.8 3.1*   Recent Labs    01/05/22 0353 01/07/22 0407  WBC 12.0* 11.1*  NEUTROABS 10.3* 9.6*  HGB 14.5 14.5  HCT 43.2 42.2  MCV 96.0 93.2  PLT 206 163   No results for input(s): LABPROT, INR in the last 72 hours.    Assessment Nausea/vomiting; coffee-ground emesis - EGD 01/06/2022: LA grade D esophagitis, congested mucosa in the esophagus, erythematous mucosa in the stomach. - Hgb 14.5, BUN 18, Cr 0.66 - Normal LFTs - WBC 11.1, improved  Plan: Continue PPI, carafate Continue clear liquid diet. [facility] will follow.  Angelique Holm PA-C 01/07/2022, 12:09 PM  Contact #  [phone]

## 2022-01-07 NOTE — Assessment & Plan Note (Signed)
-   Follow nutrition recommendations

## 2022-01-08 DIAGNOSIS — E440 Moderate protein-calorie malnutrition: Secondary | ICD-10-CM

## 2022-01-08 DIAGNOSIS — I4891 Unspecified atrial fibrillation: Secondary | ICD-10-CM | POA: Diagnosis not present

## 2022-01-08 DIAGNOSIS — R5381 Other malaise: Secondary | ICD-10-CM

## 2022-01-08 DIAGNOSIS — N400 Enlarged prostate without lower urinary tract symptoms: Secondary | ICD-10-CM | POA: Diagnosis not present

## 2022-01-08 DIAGNOSIS — I10 Essential (primary) hypertension: Secondary | ICD-10-CM | POA: Diagnosis not present

## 2022-01-08 DIAGNOSIS — K922 Gastrointestinal hemorrhage, unspecified: Secondary | ICD-10-CM | POA: Diagnosis not present

## 2022-01-08 LAB — CBC WITH DIFFERENTIAL/PLATELET
Abs Immature Granulocytes: 0.04 10*3/uL (ref 0.00–0.07)
Basophils Absolute: 0 10*3/uL (ref 0.0–0.1)
Basophils Relative: 0 %
Eosinophils Absolute: 0.1 10*3/uL (ref 0.0–0.5)
Eosinophils Relative: 1 %
HCT: 39 % (ref 39.0–52.0)
Hemoglobin: 13.6 g/dL (ref 13.0–17.0)
Immature Granulocytes: 1 %
Lymphocytes Relative: 6 %
Lymphs Abs: 0.5 10*3/uL — ABNORMAL LOW (ref 0.7–4.0)
MCH: 32.6 pg (ref 26.0–34.0)
MCHC: 34.9 g/dL (ref 30.0–36.0)
MCV: 93.5 fL (ref 80.0–100.0)
Monocytes Absolute: 0.6 10*3/uL (ref 0.1–1.0)
Monocytes Relative: 7 %
Neutro Abs: 7.1 10*3/uL (ref 1.7–7.7)
Neutrophils Relative %: 85 %
Platelets: 150 10*3/uL (ref 150–400)
RBC: 4.17 MIL/uL — ABNORMAL LOW (ref 4.22–5.81)
RDW: 11.9 % (ref 11.5–15.5)
WBC: 8.3 10*3/uL (ref 4.0–10.5)
nRBC: 0 % (ref 0.0–0.2)

## 2022-01-08 LAB — BASIC METABOLIC PANEL
Anion gap: 6 (ref 5–15)
BUN: 15 mg/dL (ref 8–23)
CO2: 32 mmol/L (ref 22–32)
Calcium: 8.5 mg/dL — ABNORMAL LOW (ref 8.9–10.3)
Chloride: 96 mmol/L — ABNORMAL LOW (ref 98–111)
Creatinine, Ser: 0.66 mg/dL (ref 0.61–1.24)
GFR, Estimated: >60 mL/min (ref >60)
Glucose, Bld: 133 mg/dL — ABNORMAL HIGH (ref 70–99)
Potassium: 3.5 mmol/L (ref 3.5–5.1)
Sodium: 134 mmol/L — ABNORMAL LOW (ref 135–145)

## 2022-01-08 LAB — MAGNESIUM: Magnesium: 2.2 mg/dL (ref 1.7–2.4)

## 2022-01-08 MED ORDER — MELATONIN 5 MG PO TABS
5.0000 mg | ORAL_TABLET | Freq: Once | ORAL | Status: AC
  Administered 2022-01-08: 5 mg via ORAL
  Filled 2022-01-08: qty 1

## 2022-01-08 MED ORDER — TRIAMCINOLONE ACETONIDE 0.1 % EX CREA
1.0000 "application " | TOPICAL_CREAM | Freq: Two times a day (BID) | CUTANEOUS | Status: DC
  Administered 2022-01-08 – 2022-01-09 (×2): 1 via TOPICAL
  Filled 2022-01-08: qty 15

## 2022-01-08 NOTE — Assessment & Plan Note (Signed)
-   PT recommends home health PT °

## 2022-01-08 NOTE — Progress Notes (Signed)
CCMD notified this RN on pt 8 beats run of Vtach. Pt lying on bed and is in no acute distress. V/S are stable. Will continue to monitor

## 2022-01-08 NOTE — Progress Notes (Signed)
Progress Note   Patient: Joseph Simpson:[id number] DOB: 1935/03/15 DOA: 01/03/2022     5 DOS: the patient was seen and examined on 01/08/2022   Brief hospital course: 87 year old male with history of nephrolithiasis, squamous cell cancer and melanoma, nonobstructive CAD, GERD/gastroparesis, hypertension, hyperlipidemia, prior gastric surgery for ulcer presented with coffee-ground emesis.  GI was consulted.  He also was found to have new onset A-fib.  Cardiology was consulted.  Currently rate controlled, cardiology has signed off.  He underwent EGD on 01/06/2022 which showed LA grade D esophagitis along with gastritis and gastric ulcerations.  Assessment and Plan: GI bleed- (present on admission) - Currently on IV Protonix twice a day along with oral Carafate. -GI following.  Status post EGD on 01/06/2022 which showed LA grade D esophagitis along with gastritis and gastric ulcerations.  Currently on full liquid diet and tolerating it.  Diet advancement as per GI recommendations.  New onset a-fib [facility])- (present on admission) -Currently rate controlled.  Not on anticoagulation because of GI bleed. -Continue metoprolol.  Cardiology signed off.  Outpatient follow-up with cardiology.   UTI (urinary tract infection)- (present on admission) Urinary retention - Continue meropenem for ESBL in urine culture.  Stop date 01/08/2022. -Foley catheter placed during this hospitalization.  Remove Foley catheter today-if patient goes into retention again, we will have to have another Foley catheter placed with outpatient follow-up with urology  -Continue Flomax  CAD (coronary artery disease)- (present on admission) - Patient had nonobstructive CAD as per cath in 2013 - Currently stable.  Continue medical management with metoprolol, losartan.  Outpatient follow-up with cardiology.  Essential hypertension- (present on admission) - Continue with metoprolol and losartan.  Physical deconditioning - PT  recommends home health PT.  Protein-calorie malnutrition, moderate (HCC) - Follow nutrition recommendations  BPH (benign prostatic hyperplasia)- (present on admission) Continue Flomax and follow closely with Dr. Diona Fanti    Subjective:  Patient seen and examined at bedside.  Feels like better and tolerating her liquid diet with no vomiting this morning.  Denies worsening shortness breath, chest pain or fever. Physical Exam: Vitals:   01/07/22 1213 01/07/22 2034 01/08/22 0452 01/08/22 0500  BP: 122/75 129/76 129/71   Pulse: 77 74 66   Resp: 18 16 18    Temp: 97.6 F (36.4 C) 97.9 F (36.6 C) 98.2 F (36.8 C)   TempSrc: Oral Oral Oral   SpO2: 98% 96% 97%   Weight:    73.5 kg  Height:       General: On room air currently.  No distress.   ENT/neck: No palpable thyromegaly; JVD is not elevated  respiratory: Decreased breath sounds at bases bilaterally with some crackles CVS: Currently rate controlled; S1-S2 heard Abdominal: Soft, nontender, mildly distended; no organomegaly; normal bowel sounds heard  extremities: No edema, clubbing CNS: Alert and awake.  No focal neurologic deficit.  Moves extremities. Lymph: No palpable lymphadenopathy skin: No obvious ecchymosis/lesions  psych: Affect is mostly flat.  No signs of agitation. Musculoskeletal: No obvious joint swelling/tenderness  Data Reviewed: I have reviewed patient's investigations during this hospitalization myself.  Today sodium is 134, potassium of 3.5, creatinine of 0.66, magnesium of 2.2, WBC of 8.3, hemoglobin of 13.6  Family Communication: Son at bedside  Disposition: Status is: Inpatient Remains inpatient appropriate because: Of need for IV antibiotics.  Possible discharge tomorrow if remains stable and tolerating diet and cleared by GI.     Planned Discharge Destination: Home with Home Health     Time  spent: 50 minutes  Author: Aline August, MD 01/08/2022 9:32 AM  For on call review  [email or web address].

## 2022-01-08 NOTE — Progress Notes (Signed)
Physical Therapy Treatment Patient Details Name: Joseph Simpson MRN: [id number] DOB: 22-Apr-1935 Today's Date: 01/08/2022   History of Present Illness 87 yo male admitted with N/V, UTI, Afib. Hx of CAD, L post hip 2022, BPH    PT Comments    Pt ambulated 140' with RW, no loss of balance. He is progressing well with mobility. From a PT standpoint, he is ready to DC home.    Recommendations for follow up therapy are one component of a multi-disciplinary discharge planning process, led by the attending physician.  Recommendations may be updated based on patient status, additional functional criteria and insurance authorization.  Follow Up Recommendations  Home health PT (depending on progress)     Assistance Recommended at Discharge PRN  Patient can return home with the following A little help with walking and/or transfers;A little help with bathing/dressing/bathroom;Help with stairs or ramp for entrance;Assistance with cooking/housework;Assist for transportation   Equipment Recommendations  None recommended by PT    Recommendations for Other Services       Precautions / Restrictions Precautions Precautions: Fall Restrictions Weight Bearing Restrictions: No     Mobility  Bed Mobility Overal bed mobility: Needs Assistance Bed Mobility: Supine to Sit, Sit to Supine     Supine to sit: Min assist     General bed mobility comments: min A to raise trunk, min A for LEs into bed    Transfers Overall transfer level: Needs assistance Equipment used: Rolling walker (2 wheels) Transfers: Sit to/from Stand Sit to Stand: Supervision           General transfer comment: Increased time. Cues for safety, hand placement    Ambulation/Gait Ambulation/Gait assistance: Modified independent (Device/Increase time) Gait Distance (Feet): 140 Feet Assistive device: Rolling walker (2 wheels) Gait Pattern/deviations: Step-through pattern, Decreased stride length, Trunk flexed Gait  velocity: decr     General Gait Details: no loss of balance   Stairs             Wheelchair Mobility    Modified Rankin (Stroke Patients Only)       Balance Overall balance assessment: Needs assistance   Sitting balance-Leahy Scale: Good     Standing balance support: Bilateral upper extremity supported, During functional activity, Reliant on assistive device for balance Standing balance-Leahy Scale: Fair                              Cognition Arousal/Alertness: Awake/alert Behavior During Therapy: WFL for tasks assessed/performed Overall Cognitive Status: Within Functional Limits for tasks assessed                                          Exercises      General Comments        Pertinent Vitals/Pain Pain Assessment Pain Assessment: No/denies pain    Home Living                          Prior Function            PT Goals (current goals can now be found in the care plan section) Acute Rehab PT Goals Patient Stated Goal: to regain PLOF/independence PT Goal Formulation: With patient Time For Goal Achievement: 01/21/22 Potential to Achieve Goals: Good Progress towards PT goals: Progressing toward goals    Frequency  7X/week      PT Plan Current plan remains appropriate    Co-evaluation              AM-PAC PT "6 Clicks" Mobility   Outcome Measure  Help needed turning from your back to your side while in a flat bed without using bedrails?: A Little Help needed moving from lying on your back to sitting on the side of a flat bed without using bedrails?: A Little Help needed moving to and from a bed to a chair (including a wheelchair)?: A Little Help needed standing up from a chair using your arms (e.g., wheelchair or bedside chair)?: A Little Help needed to walk in hospital room?: None Help needed climbing 3-5 steps with a railing? : A Little 6 Click Score: 19    End of Session Equipment  Utilized During Treatment: Gait belt Activity Tolerance: Patient tolerated treatment well Patient left: in chair;with call bell/phone within reach;with family/visitor present Nurse Communication: Mobility status;Other (comment) (pt has a rash on his back) PT Visit Diagnosis: Muscle weakness (generalized) (M62.81);Difficulty in walking, not elsewhere classified (R26.2)     Time: 1610-9604 PT Time Calculation (min) (ACUTE ONLY): 14 min  Charges:  $Gait Training: 8-22 mins                    Blondell Reveal Kistler PT 01/08/2022  Acute Rehabilitation Services Pager [phone] Office [phone]

## 2022-01-08 NOTE — TOC Progression Note (Signed)
Transition of Care [facility]) - Progression Note    Patient Details  Name: Joseph Simpson MRN: [id number] Date of Birth: [birth year]  [facility] CM/SW Contact  Purcell Mouton, RN Phone Number: 01/08/2022, 2:19 PM  Clinical Narrative:    Pt will discharge with HHPT and will need Order from MD for HHPT. [facility] was selected and referral was given to in house rep.    Expected Discharge Plan: [place] Barriers to Discharge: No Barriers Identified  Expected Discharge Plan and Services Expected Discharge Plan: [place]   Discharge Planning Services: CM Consult Post Acute Care Choice: [place] arrangements for the past 2 months: Single Family Home                                       Social Determinants of Health (SDOH) Interventions    Readmission Risk Interventions No flowsheet data found.

## 2022-01-09 DIAGNOSIS — E440 Moderate protein-calorie malnutrition: Secondary | ICD-10-CM | POA: Diagnosis not present

## 2022-01-09 DIAGNOSIS — I4891 Unspecified atrial fibrillation: Secondary | ICD-10-CM | POA: Diagnosis not present

## 2022-01-09 DIAGNOSIS — K922 Gastrointestinal hemorrhage, unspecified: Secondary | ICD-10-CM | POA: Diagnosis not present

## 2022-01-09 DIAGNOSIS — I10 Essential (primary) hypertension: Secondary | ICD-10-CM | POA: Diagnosis not present

## 2022-01-09 LAB — CBC WITH DIFFERENTIAL/PLATELET
Abs Immature Granulocytes: 0.03 10*3/uL (ref 0.00–0.07)
Basophils Absolute: 0 10*3/uL (ref 0.0–0.1)
Basophils Relative: 0 %
Eosinophils Absolute: 0.1 10*3/uL (ref 0.0–0.5)
Eosinophils Relative: 1 %
HCT: 37.6 % — ABNORMAL LOW (ref 39.0–52.0)
Hemoglobin: 13.1 g/dL (ref 13.0–17.0)
Immature Granulocytes: 0 %
Lymphocytes Relative: 8 %
Lymphs Abs: 0.6 10*3/uL — ABNORMAL LOW (ref 0.7–4.0)
MCH: 32 pg (ref 26.0–34.0)
MCHC: 34.8 g/dL (ref 30.0–36.0)
MCV: 91.9 fL (ref 80.0–100.0)
Monocytes Absolute: 0.7 10*3/uL (ref 0.1–1.0)
Monocytes Relative: 9 %
Neutro Abs: 6.3 10*3/uL (ref 1.7–7.7)
Neutrophils Relative %: 82 %
Platelets: 131 10*3/uL — ABNORMAL LOW (ref 150–400)
RBC: 4.09 MIL/uL — ABNORMAL LOW (ref 4.22–5.81)
RDW: 11.9 % (ref 11.5–15.5)
WBC: 7.7 10*3/uL (ref 4.0–10.5)
nRBC: 0 % (ref 0.0–0.2)

## 2022-01-09 LAB — MAGNESIUM: Magnesium: 2 mg/dL (ref 1.7–2.4)

## 2022-01-09 LAB — BASIC METABOLIC PANEL
Anion gap: 5 (ref 5–15)
BUN: 14 mg/dL (ref 8–23)
CO2: 32 mmol/L (ref 22–32)
Calcium: 8.1 mg/dL — ABNORMAL LOW (ref 8.9–10.3)
Chloride: 94 mmol/L — ABNORMAL LOW (ref 98–111)
Creatinine, Ser: 0.7 mg/dL (ref 0.61–1.24)
GFR, Estimated: >60 mL/min (ref >60)
Glucose, Bld: 124 mg/dL — ABNORMAL HIGH (ref 70–99)
Potassium: 3.1 mmol/L — ABNORMAL LOW (ref 3.5–5.1)
Sodium: 131 mmol/L — ABNORMAL LOW (ref 135–145)

## 2022-01-09 MED ORDER — PANTOPRAZOLE SODIUM 40 MG PO TBEC: 40.0000 mg | DELAYED_RELEASE_TABLET | Freq: Two times a day (BID) | ORAL | 0 refills | Status: DC

## 2022-01-09 MED ORDER — METOPROLOL TARTRATE 25 MG PO TABS: 25.0000 mg | ORAL_TABLET | Freq: Two times a day (BID) | ORAL | 0 refills | Status: DC

## 2022-01-09 MED ORDER — LOSARTAN POTASSIUM 25 MG PO TABS: 12.5000 mg | ORAL_TABLET | Freq: Every day | ORAL | 0 refills | Status: DC

## 2022-01-09 MED ORDER — POTASSIUM CHLORIDE 20 MEQ PO PACK
40.0000 meq | PACK | ORAL | Status: DC
  Administered 2022-01-09: 40 meq via ORAL
  Filled 2022-01-09: qty 2

## 2022-01-09 MED ORDER — POTASSIUM CHLORIDE CRYS ER 20 MEQ PO TBCR: 40.0000 meq | EXTENDED_RELEASE_TABLET | ORAL | Status: DC

## 2022-01-09 MED ORDER — SUCRALFATE 1 G PO TABS: 1.0000 g | ORAL_TABLET | Freq: Three times a day (TID) | ORAL | 1 refills | Status: DC

## 2022-01-09 NOTE — Discharge Summary (Signed)
Physician Discharge Summary   Patient: Joseph Simpson MRN: [id number] DOB: [birth year]  Admit date:     01/03/2022  Discharge date: 01/09/22  Discharge Physician: Aline August   PCP: Marin Olp, MD   Recommendations at discharge:   Outpatient follow-up with PCP within a week Follow-up with GI/urology Follow-up in the ED if symptoms worsen or new.  Hospital Course: 87 year old male with history of nephrolithiasis, squamous cell cancer and melanoma, nonobstructive CAD, GERD/gastroparesis, hypertension, hyperlipidemia, prior gastric surgery for ulcer presented with coffee-ground emesis.  GI was consulted.  He also was found to have new onset A-fib.  Cardiology was consulted.  Currently rate controlled, cardiology has signed off.  He underwent EGD on 01/06/2022 which showed LA grade D esophagitis along with gastritis and gastric ulcerations. Subsequently, he has tolerated advanced diet.  He has also completed 5-day course of IV meropenem for ESBL in urine culture and his Foley catheter has been removed.  He will be discharged home today with home health PT.  Outpatient follow-up with PCP/GI/urology.  Discharge diagnosis and assessment and Plan: GI bleed- (present on admission) - Currently on IV Protonix twice a day along with oral Carafate. -GI following.  Status post EGD on 01/06/2022 which showed LA grade D esophagitis along with gastritis and gastric ulcerations.   -Diet has been advanced and patient is currently tolerating it without vomiting. -Discharge patient home today on oral Protonix and Carafate.  Outpatient follow-up with GI.  New onset a-fib [facility])- (present on admission) -Currently rate controlled.  Not on anticoagulation because of GI bleed. -Continue metoprolol.  Cardiology signed off.  Outpatient follow-up with cardiology.   UTI (urinary tract infection)- (present on admission) Urinary retention -Treated with meropenem for 5 days for ESBL in urine culture.  -Foley  catheter placed during this hospitalization.  Foley catheter removed on 01/08/2022 and patient is urinating well. -Continue Flomax.  Outpatient follow-up with urology.  CAD (coronary artery disease)- (present on admission) - Patient had nonobstructive CAD as per cath in 2013 - Currently stable.  Continue medical management with metoprolol, losartan.  Outpatient follow-up with cardiology.  Essential hypertension- (present on admission) - Continue with metoprolol and losartan.  Physical deconditioning - PT recommends home health PT.  Protein-calorie malnutrition, moderate (HCC) - Follow nutrition recommendations  BPH (benign prostatic hyperplasia)- (present on admission) Continue Flomax and follow closely with Dr. Diona Fanti    Consultants: GI/cardiology Procedures performed: EGD Disposition: Home Diet recommendation:  Discharge Diet Orders (From admission, onward)     Start     Ordered   01/09/22 0000  Diet - low sodium heart healthy       Comments: Soft diet for 2 weeks   01/09/22 0907           Soft diet for 2 weeks as per GI  DISCHARGE MEDICATION: Allergies as of 01/09/2022       Reactions   Aspirin Other (See Comments)   UPSET STOMACH   Percocet [oxycodone-acetaminophen] Nausea And Vomiting   Vitamins [apatate] Other (See Comments)   Bothers stomach        Medication List     STOP taking these medications    ciprofloxacin 250 MG tablet Commonly known as: CIPRO   sennosides-docusate sodium 8.6-50 MG tablet Commonly known as: SENOKOT-S       TAKE these medications    acetaminophen 500 MG tablet Commonly known as: TYLENOL Take 500-1,000 mg by mouth 2 (two) times daily as needed for moderate pain or headache.  atorvastatin 20 MG tablet Commonly known as: LIPITOR TAKE ONE TABLET BY MOUTH THREE TIMES A WEEK   B-12 COMPLIANCE INJECTION IJ Inject 1 Dose as directed every 30 (thirty) days.   bevacizumab 2.5 mg/0.1 mL Soln Commonly known as:  AVASTIN 2.5 mg by Intravitreal route. Injection to eyes every 5 weeks - use with vigamox   Carboxymethylcellulose Sodium 1 % Gel Place 1 drop into both eyes at bedtime.   losartan 25 MG tablet Commonly known as: COZAAR Take 0.5 tablets (12.5 mg total) by mouth daily. Start taking on: January 10, 2022 What changed:  medication strength how much to take how to take this when to take this additional instructions   metoCLOPramide 5 MG tablet Commonly known as: REGLAN Take 5 mg by mouth 2 (two) times daily.   metoprolol tartrate 25 MG tablet Commonly known as: LOPRESSOR Take 1 tablet (25 mg total) by mouth 2 (two) times daily. What changed: how much to take   ondansetron 4 MG disintegrating tablet Commonly known as: ZOFRAN-ODT Take 1 tablet (4 mg total) by mouth every 8 (eight) hours as needed for nausea or vomiting.   pantoprazole 40 MG tablet Commonly known as: PROTONIX Take 1 tablet (40 mg total) by mouth 2 (two) times daily.   polyethylene glycol 17 g packet Commonly known as: MIRALAX / GLYCOLAX Take 17 g by mouth daily as needed for moderate constipation.   REFRESH OPTIVE ADVANCED OP Place 1 drop into both eyes daily.   sucralfate 1 g tablet Commonly known as: CARAFATE Take 1 tablet (1 g total) by mouth 4 (four) times daily -  with meals and at bedtime.   tamsulosin 0.4 MG Caps capsule Commonly known as: FLOMAX Take 0.4 mg by mouth daily.   triamcinolone cream 0.1 % Commonly known as: KENALOG Apply 1 application topically 2 (two) times daily.   Vigamox 0.5 % ophthalmic solution Generic drug: moxifloxacin Place 1 drop into both eyes as directed. Take every 5 weeks as directed - use with avastin, instill 1 drop into both eyes 4 times daily for 2 days after eye injections        Follow-up Information     Martin[place], Peter M, MD Follow up on 01/26/2022.   Specialty: Cardiology Why: Appointment at 10 AM Contact information: [address] [place] [address] [phone]         Marin Olp, MD. Schedule an appointment as soon as possible for a visit in 1 week(s).   Specialty: Family Medicine Why: with repeat cbc/bmp Contact information: [place] [address] [phone]         [place], Peter M, MD .   Specialty: Cardiology Contact information: [address] [place] [address] [phone]         Arta Silence, MD Follow up in 2 week(s).   Specialty: Gastroenterology Contact information: [address] [place] [address] [phone]               Subjective: Patient seen and examined at bedside.  Tolerating soft diet well without nausea or vomiting.  Wants to go home today.  No fever, chest pain or shortness of breath reported.  Discharge Exam: Filed Weights   01/03/22 1041 01/08/22 0500 01/09/22 0500  Weight: 77.1 kg 73.5 kg 75.1 kg   General: No acute distress, currently on room air.   respiratory: Bilateral decreased breath sounds at bases with some scattered crackles CVS: S1-S2 heard,  rate controlled Abdominal: Soft, nontender, nondistended, no organomegaly, bowel sounds heard Extremities: No cyanosis, clubbing; trace lower extremity edema   Condition at discharge: fair  The results of significant diagnostics from this hospitalization (including imaging, microbiology, ancillary and laboratory) are listed below for reference.   Imaging Studies: CT ABDOMEN PELVIS W CONTRAST  Result Date: 01/03/2022 CLINICAL DATA:  Acute abdominal pain, nausea vomiting. EXAM: CT ABDOMEN AND PELVIS WITH CONTRAST TECHNIQUE: Multidetector CT imaging of the abdomen and pelvis was performed using the standard protocol following bolus administration of intravenous contrast. RADIATION DOSE REDUCTION: This exam was performed according to the departmental dose-optimization program which includes automated exposure control, adjustment of the  mA and/or kV according to patient size and/or use of iterative reconstruction technique. CONTRAST:  155mL OMNIPAQUE IOHEXOL 300 MG/ML  SOLN COMPARISON:  None. FINDINGS: Lower chest: No acute abnormality. Subsegmental atelectasis and fine subpleural reticulation at the lung bases, right greater than left. Hepatobiliary: No focal liver abnormality is seen. No gallstones, gallbladder wall thickening, or biliary dilatation. Pancreas: Diffuse moderate parenchymal volume loss and fatty infiltration. No pancreatic ductal dilatation or surrounding inflammatory changes. Spleen: Normal in size without focal abnormality. Adrenals/Urinary Tract: Adrenal glands are unremarkable. Multiple subcentimeter bilateral simple cortical renal cysts. Nonobstructive calyceal stones are noted bilaterally measuring up to 0.5 cm at the lower pole of the right kidney (series 2, image 28). No hydronephrosis or perinephric fat stranding. Mild dilatation of the right ureter, slightly increased compared to 01/01/2022, with subtle mucosal hyperenhancement (series 4, images 72-77). There is an abrupt caliber change of the ureter as it crosses the iliac vessels (series 2, images 53-59). The urinary bladder is mildly distended with a few tiny locules of air within the lumen. Multiple bladder diverticuli are noted. Stomach/Bowel: Postoperative changes at the gastroesophageal junction. Distal gastrectomy with gastrojejunostomy. Diverticulosis coli. No evidence of bowel wall thickening, obstruction, or other inflammatory changes. Vascular/Lymphatic: Aortic atherosclerosis. No enlarged abdominal or pelvic lymph nodes. Reproductive: Enlarged prostate extends into the inferior aspect of the urinary bladder. Other: No abdominal wall hernia or abnormality. No abdominopelvic ascites. Musculoskeletal: Multilevel moderate to advanced degenerative disc disease throughout the visualized distal thoracic spine and lumbar spine. No acute osseous abnormality. No  suspicious osseous findings. Left hip prosthesis is intact. IMPRESSION: 1. Mild dilatation of the right ureter with mucosal hyperenhancement, as seen on recent CT 01/01/2022, suggestive of urinary tract infection. Recommend correlation with urinalysis. 2. Stable appearance of the urinary bladder with scattered locules of air, presumed to be related to prior instrumentation. 3. Bilateral nonobstructive nephrolithiasis. 4. Prostatomegaly. 5. Colonic diverticulosis without diverticulitis. 6.  Aortic Atherosclerosis (ICD10-I70.0). Electronically Signed   By: Ileana Roup M.D.   On: 01/03/2022 16:41   CT ABDOMEN PELVIS W CONTRAST  Result Date: 01/01/2022 CLINICAL DATA:  Left lower quadrant abdominal pain. EXAM: CT ABDOMEN AND PELVIS WITH CONTRAST TECHNIQUE: Multidetector CT imaging of the abdomen and pelvis was performed using the standard protocol following bolus administration of intravenous contrast. RADIATION DOSE REDUCTION: This exam was performed according to the departmental dose-optimization program which includes automated exposure control, adjustment of the mA and/or kV according to patient size and/or use of iterative reconstruction technique. CONTRAST:  151mL OMNIPAQUE IOHEXOL 300 MG/ML  SOLN COMPARISON:  02/20/2013 FINDINGS: Lower chest: Unremarkable. Hepatobiliary: No suspicious focal abnormality within the liver parenchyma. There is no evidence for gallstones, gallbladder wall thickening, or pericholecystic fluid. No intrahepatic or extrahepatic biliary dilation. Pancreas: No focal mass lesion. No dilatation of the main duct. No intraparenchymal  cyst. No peripancreatic edema. Spleen: No splenomegaly. No focal mass lesion. Adrenals/Urinary Tract: No adrenal nodule or mass. Cortical scarring noted in the right kidney with a 2 x 4 mm nonobstructing stone in the lower pole right kidney. No stones are seen in the left kidney. No suspicious enhancing renal mass lesion. Patient is noted to have  hyperenhancement of the urothelium in the right renal pelvis and right ureter associated with relatively diffuse mucosal hyperenhancement in the bladder. Bladder wall trabeculation and multiple tiny bladder wall diverticuli evident. There is gas in the bladder lumen, presumably secondary to recent instrumentation. Stomach/Bowel: Status post distal gastrectomy. Duodenum unremarkable. No small bowel wall thickening. No small bowel dilatation. The terminal ileum is normal. The appendix is not well visualized, but there is no edema or inflammation in the region of the cecum. No gross colonic mass. No colonic wall thickening. Diverticular changes are noted in the left colon without evidence of diverticulitis. Vascular/Lymphatic: There is mild atherosclerotic calcification of the abdominal aorta without aneurysm. There is no gastrohepatic or hepatoduodenal ligament lymphadenopathy. No retroperitoneal or mesenteric lymphadenopathy. No pelvic sidewall lymphadenopathy. Reproductive: Prostate gland is enlarged. Other: No intraperitoneal free fluid. Musculoskeletal: Status post left total hip replacement. No worrisome lytic or sclerotic osseous abnormality. IMPRESSION: 1. 2 x 4 mm nonobstructing stone in the lower pole of the right kidney. 2. Patient is noted to have hyperenhancement of the urothelium in the right renal pelvis and right ureter associated with relatively diffuse mucosal hyperenhancement in the bladder. Imaging features are compatible with infection/inflammation. 3. Bladder wall trabeculation and multiple tiny bladder wall diverticuli evident suggesting component of bladder outlet obstruction. There is gas in the bladder lumen, presumably secondary to recent instrumentation. In the absence of recent instrumentation, bladder infection would be a distinct concern. 4. Prostatomegaly. 5. Left colonic diverticulosis without diverticulitis. 6. Aortic Atherosclerosis (ICD10-I70.0). Electronically Signed   By: Misty Stanley M.D.   On: 01/01/2022 12:25   US RENAL  Result Date: 01/06/2022 CLINICAL DATA:  Hydronephrosis EXAM: RENAL / URINARY TRACT ULTRASOUND COMPLETE COMPARISON:  CT 01/03/2022 FINDINGS: Right Kidney: Renal measurements: 11.3 x 5.0 x 5.8 cm = volume: 202.2 mL. There is a 9 mm calculus in the lower pole the right kidney. No hydronephrosis. Tiny renal cyst. Left Kidney: Renal measurements: 12.6 x 5.9 x 6.7 cm = volume: 259.3 mL. Subcentimeter simple appearing renal cyst. No hydronephrosis. No nephrolithiasis. Bladder: Decompressed with Foley catheter in place. Other: Enlarged prostate. IMPRESSION: No hydronephrosis. Renal calculus in the right lower pole measuring 9 mm. Enlarged prostate. Electronically Signed   By: Maurine Simmering M.D.   On: 01/06/2022 08:23   DG Chest Portable 1 View  Result Date: 01/03/2022 CLINICAL DATA:  Coffee-ground emesis. EXAM: PORTABLE CHEST 1 VIEW COMPARISON:  02/21/2014 and prior chest radiographs FINDINGS: The cardiomediastinal silhouette is unremarkable. There is no evidence of focal airspace disease, pulmonary edema, suspicious pulmonary nodule/mass, pleural effusion, or pneumothorax. No acute bony abnormalities are identified. RIGHT shoulder arthroplasty surgical hardware noted. Surgical clips overlying the UPPER abdomen are again noted. IMPRESSION: No active disease. Electronically Signed   By: Margarette Canada M.D.   On: 01/03/2022 13:52   ECHOCARDIOGRAM COMPLETE  Result Date: 01/05/2022    ECHOCARDIOGRAM REPORT   Patient Name:   GWEN EDLER Heggs Date of Exam: 01/05/2022 Medical Rec #:  [id number]      Height:       72.0 in Accession #:    [id number]     Weight:  170.0 lb Date of Birth:  1935-10-02       BSA:          1.988 m Patient Age:    54 years       BP:           159/86 mmHg Patient Gender: M              HR:           81 bpm. Exam Location:  Inpatient Procedure: 2D Echo Indications:    AFIB  History:        Patient has prior history of Echocardiogram examinations, most                  recent 04/16/2009. CAD, Arrythmias:Atrial Fibrillation; Risk                 Factors:Hypertension.  Sonographer:    Jefferey Pica Referring Phys: [id number] [place]  1. Left ventricular ejection fraction, by estimation, is 60 to 65%. The left ventricle has normal function. The left ventricle has no regional wall motion abnormalities. Left ventricular diastolic function could not be evaluated.  2. Right ventricular systolic function is normal. The right ventricular size is normal. There is normal pulmonary artery systolic pressure.  3. Left atrial size was mildly dilated.  4. The mitral valve is normal in structure. Mild mitral valve regurgitation. No evidence of mitral stenosis.  5. The aortic valve is normal in structure. There is mild calcification of the aortic valve. There is mild thickening of the aortic valve. Aortic valve regurgitation is not visualized. FINDINGS  Left Ventricle: Left ventricular ejection fraction, by estimation, is 60 to 65%. The left ventricle has normal function. The left ventricle has no regional wall motion abnormalities. The left ventricular internal cavity size was normal in size. There is  no left ventricular hypertrophy. Left ventricular diastolic function could not be evaluated due to atrial fibrillation. Left ventricular diastolic function could not be evaluated. Right Ventricle: The right ventricular size is normal. No increase in right ventricular wall thickness. Right ventricular systolic function is normal. There is normal pulmonary artery systolic pressure. The tricuspid regurgitant velocity is 2.62 m/s, and  with an assumed right atrial pressure of 5 mmHg, the estimated right ventricular systolic pressure is 91.6 mmHg. Left Atrium: Left atrial size was mildly dilated. Right Atrium: Right atrial size was normal in size. Pericardium: There is no evidence of pericardial effusion. Mitral Valve: The mitral valve is normal in structure. Mild mitral  valve regurgitation. No evidence of mitral valve stenosis. Tricuspid Valve: The tricuspid valve is grossly normal. Tricuspid valve regurgitation is trivial. Aortic Valve: The aortic valve is normal in structure. There is mild calcification of the aortic valve. There is mild thickening of the aortic valve. Aortic valve regurgitation is not visualized. Aortic valve peak gradient measures 14.5 mmHg. Pulmonic Valve: The pulmonic valve was not well visualized. Pulmonic valve regurgitation is not visualized. Aorta: The aortic root and ascending aorta are structurally normal, with no evidence of dilitation. IAS/Shunts: The atrial septum is grossly normal.  LEFT VENTRICLE PLAX 2D LVIDd:         3.80 cm LVIDs:         2.20 cm LV PW:         1.10 cm LV IVS:        1.10 cm  RIGHT VENTRICLE             IVC RV Basal diam:  3.10 cm     IVC diam: 1.90 cm RV S prime:     17.30 cm/s TAPSE (M-mode): 2.3 cm LEFT ATRIUM           Index        RIGHT ATRIUM           Index LA diam:      3.90 cm 1.96 cm/m   RA Area:     11.40 cm LA Vol (A2C): 30.3 ml 15.24 ml/m  RA Volume:   23.50 ml  11.82 ml/m LA Vol (A4C): 75.1 ml 37.77 ml/m  AORTIC VALVE               PULMONIC VALVE AV Vmax:      190.60 cm/s  PV Vmax:       1.02 m/s AV Peak Grad: 14.5 mmHg    PV Peak grad:  4.2 mmHg LVOT Vmax:    163.00 cm/s LVOT Vmean:   103.000 cm/s LVOT VTI:     0.262 m  AORTA Ao Root diam: 3.90 cm Ao Asc diam:  3.80 cm MITRAL VALVE               TRICUSPID VALVE MV Area (PHT): 4.35 cm    TR Peak grad:   27.5 mmHg MV Decel Time: 175 msec    TR Vmax:        262.00 cm/s MV E velocity: 92.45 cm/s                            SHUNTS                            Systemic VTI: 0.26 m Mertie Moores MD Electronically signed by Mertie Moores MD Signature Date/Time: 01/05/2022/2:56:10 PM    Final     Microbiology: Results for orders placed or performed during the hospital encounter of 01/03/22  Resp Panel by RT-PCR (Flu A&B, Covid) Nasopharyngeal Swab     Status: None    Collection Time: 01/03/22  1:56 PM   Specimen: Nasopharyngeal Swab; Nasopharyngeal(NP) swabs in vial transport medium  Result Value Ref Range Status   SARS Coronavirus 2 by RT PCR NEGATIVE NEGATIVE Final    Comment: (NOTE) SARS-CoV-2 target nucleic acids are NOT DETECTED.  The SARS-CoV-2 RNA is generally detectable in upper respiratory specimens during the acute phase of infection. The lowest concentration of SARS-CoV-2 viral copies this assay can detect is 138 copies/mL. A negative result does not preclude SARS-Cov-2 infection and should not be used as the sole basis for treatment or other patient management decisions. A negative result may occur with  improper specimen collection/handling, submission of specimen other than nasopharyngeal swab, presence of viral mutation(s) within the areas targeted by this assay, and inadequate number of viral copies(<138 copies/mL). A negative result must be combined with clinical observations, patient history, and epidemiological information. The expected result is Negative.  Fact Sheet for Patients:  EntrepreneurPulse.com.au  Fact Sheet for Healthcare Providers:  IncredibleEmployment.be  This test is no t yet approved or cleared by the [place] FDA and  has been authorized for detection and/or diagnosis of SARS-CoV-2 by FDA under an Emergency Use Authorization (EUA). This EUA will remain  in effect (meaning this test can be used) for the duration of the COVID-19 declaration under Section 564(b)(1) of the Act, 21 U.S.C.section 360bbb-3(b)(1), unless the authorization is terminated  or revoked sooner.  Influenza A by PCR NEGATIVE NEGATIVE Final   Influenza B by PCR NEGATIVE NEGATIVE Final    Comment: (NOTE) The Xpert Xpress SARS-CoV-2/FLU/RSV plus assay is intended as an aid in the diagnosis of influenza from Nasopharyngeal swab specimens and should not be used as a sole basis for treatment.  Nasal washings and aspirates are unacceptable for Xpert Xpress SARS-CoV-2/FLU/RSV testing.  Fact Sheet for Patients: EntrepreneurPulse.com.au  Fact Sheet for Healthcare Providers: IncredibleEmployment.be  This test is not yet approved or cleared by the [place] FDA and has been authorized for detection and/or diagnosis of SARS-CoV-2 by FDA under an Emergency Use Authorization (EUA). This EUA will remain in effect (meaning this test can be used) for the duration of the COVID-19 declaration under Section 564(b)(1) of the Act, 21 U.S.C. section 360bbb-3(b)(1), unless the authorization is terminated or revoked.  Performed at [facility], [place] [address] [place], [place] [address]     Labs: CBC: Recent Labs  Lab 01/04/22 0355 01/05/22 0353 01/07/22 0407 01/08/22 0407 01/09/22 0355  WBC 11.7* 12.0* 11.1* 8.3 7.7  NEUTROABS 10.2* 10.3* 9.6* 7.1 6.3  HGB 14.9 14.5 14.5 13.6 13.1  HCT 45.7 43.2 42.2 39.0 37.6*  MCV 95.6 96.0 93.2 93.5 91.9  PLT 235 206 163 150 528*   Basic Metabolic Panel: Recent Labs  Lab 01/05/22 0353 01/06/22 0354 01/07/22 0407 01/08/22 0407 01/09/22 0355  NA 139 138 135 134* 131*  K 3.5 3.2* 3.3* 3.5 3.1*  CL 103 100 99 96* 94*  CO2 27 27 29  32 32  GLUCOSE 133* 121* 129* 133* 124*  BUN 21 16 18 15 14   CREATININE 0.88 0.61 0.66 0.66 0.70  CALCIUM 8.8* 8.7* 8.3* 8.5* 8.1*  MG 2.5* 2.4  --  2.2 2.0  PHOS  --  3.0  --   --   --    Liver Function Tests: Recent Labs  Lab 01/03/22 1048 01/04/22 0355 01/05/22 0353 01/06/22 0354 01/07/22 0407  AST 28 16 18   --  19  ALT 18 14 15   --  17  ALKPHOS 61 59 53  --  51  BILITOT 1.3* 0.6 0.8  --  1.2  PROT 8.1 7.5 6.8  --  6.1*  ALBUMIN 4.0 3.8 3.6 3.8 3.1*   CBG: No results for input(s): GLUCAP in the last 168 hours.  Discharge time spent: greater than 30 minutes.  Signed: Aline August, MD [facility] 01/09/2022

## 2022-01-09 NOTE — TOC Transition Note (Signed)
Transition of Care [facility]) - CM/SW Discharge Note   Patient Details  Name: Joseph Simpson MRN: [id number] Date of Birth: May 21, 1935  [facility] CM/SW Contact:  Leeroy Cha, RN Phone Number: 01/09/2022, 9:06 AM   Clinical Narrative:     Dcd to home with hhc through [facility]  Final next level of care: [place] Barriers to Discharge: No Barriers Identified   Patient Goals and CMS Choice Patient states their goals for this hospitalization and ongoing recovery are:: To get better CMS Medicare.gov Compare Post Acute Care list provided to:: Patient Choice offered to / list presented to : Patient  Discharge Placement                       Discharge Plan and Services   Discharge Planning Services: CM Consult Post Acute Care Choice: Home Health                               Social Determinants of Health (SDOH) Interventions     Readmission Risk Interventions No flowsheet data found.

## 2022-01-09 NOTE — Progress Notes (Signed)
Patient and son, Cylas Falzone, have been taught and explained discharge instructions. Patient has no further questions at this time. Left arm IV has been removed. Site is clean, dry and intact.  Layla Maw, RN

## 2022-01-10 DIAGNOSIS — Z96612 Presence of left artificial shoulder joint: Secondary | ICD-10-CM | POA: Diagnosis not present

## 2022-01-10 DIAGNOSIS — E538 Deficiency of other specified B group vitamins: Secondary | ICD-10-CM | POA: Diagnosis not present

## 2022-01-10 DIAGNOSIS — I493 Ventricular premature depolarization: Secondary | ICD-10-CM | POA: Diagnosis not present

## 2022-01-10 DIAGNOSIS — R338 Other retention of urine: Secondary | ICD-10-CM | POA: Diagnosis not present

## 2022-01-10 DIAGNOSIS — N401 Enlarged prostate with lower urinary tract symptoms: Secondary | ICD-10-CM | POA: Diagnosis not present

## 2022-01-10 DIAGNOSIS — I10 Essential (primary) hypertension: Secondary | ICD-10-CM | POA: Diagnosis not present

## 2022-01-10 DIAGNOSIS — Z9181 History of falling: Secondary | ICD-10-CM | POA: Diagnosis not present

## 2022-01-10 DIAGNOSIS — M1990 Unspecified osteoarthritis, unspecified site: Secondary | ICD-10-CM | POA: Diagnosis not present

## 2022-01-10 DIAGNOSIS — K922 Gastrointestinal hemorrhage, unspecified: Secondary | ICD-10-CM | POA: Diagnosis not present

## 2022-01-10 DIAGNOSIS — I2510 Atherosclerotic heart disease of native coronary artery without angina pectoris: Secondary | ICD-10-CM | POA: Diagnosis not present

## 2022-01-10 DIAGNOSIS — Z87891 Personal history of nicotine dependence: Secondary | ICD-10-CM | POA: Diagnosis not present

## 2022-01-10 DIAGNOSIS — K219 Gastro-esophageal reflux disease without esophagitis: Secondary | ICD-10-CM | POA: Diagnosis not present

## 2022-01-10 DIAGNOSIS — K5730 Diverticulosis of large intestine without perforation or abscess without bleeding: Secondary | ICD-10-CM | POA: Diagnosis not present

## 2022-01-10 DIAGNOSIS — K259 Gastric ulcer, unspecified as acute or chronic, without hemorrhage or perforation: Secondary | ICD-10-CM | POA: Diagnosis not present

## 2022-01-10 DIAGNOSIS — Z87440 Personal history of urinary (tract) infections: Secondary | ICD-10-CM | POA: Diagnosis not present

## 2022-01-10 DIAGNOSIS — K449 Diaphragmatic hernia without obstruction or gangrene: Secondary | ICD-10-CM | POA: Diagnosis not present

## 2022-01-10 DIAGNOSIS — G4733 Obstructive sleep apnea (adult) (pediatric): Secondary | ICD-10-CM | POA: Diagnosis not present

## 2022-01-10 DIAGNOSIS — Z85820 Personal history of malignant melanoma of skin: Secondary | ICD-10-CM | POA: Diagnosis not present

## 2022-01-10 DIAGNOSIS — N200 Calculus of kidney: Secondary | ICD-10-CM | POA: Diagnosis not present

## 2022-01-10 DIAGNOSIS — I4891 Unspecified atrial fibrillation: Secondary | ICD-10-CM | POA: Diagnosis not present

## 2022-01-10 DIAGNOSIS — E440 Moderate protein-calorie malnutrition: Secondary | ICD-10-CM | POA: Diagnosis not present

## 2022-01-10 DIAGNOSIS — E785 Hyperlipidemia, unspecified: Secondary | ICD-10-CM | POA: Diagnosis not present

## 2022-01-10 DIAGNOSIS — K2970 Gastritis, unspecified, without bleeding: Secondary | ICD-10-CM | POA: Diagnosis not present

## 2022-01-10 DIAGNOSIS — Z85828 Personal history of other malignant neoplasm of skin: Secondary | ICD-10-CM | POA: Diagnosis not present

## 2022-01-10 DIAGNOSIS — H3530 Unspecified macular degeneration: Secondary | ICD-10-CM | POA: Diagnosis not present

## 2022-01-11 LAB — VITAMIN C: Vitamin C: 0.5 mg/dL (ref 0.4–2.0)

## 2022-01-12 ENCOUNTER — Telehealth: Payer: Self-pay | Admitting: Family Medicine

## 2022-01-12 ENCOUNTER — Encounter (INDEPENDENT_AMBULATORY_CARE_PROVIDER_SITE_OTHER): Payer: Medicare Other | Admitting: Ophthalmology

## 2022-01-12 DIAGNOSIS — N3000 Acute cystitis without hematuria: Secondary | ICD-10-CM | POA: Diagnosis not present

## 2022-01-12 DIAGNOSIS — R3914 Feeling of incomplete bladder emptying: Secondary | ICD-10-CM | POA: Diagnosis not present

## 2022-01-12 DIAGNOSIS — N210 Calculus in bladder: Secondary | ICD-10-CM | POA: Diagnosis not present

## 2022-01-12 NOTE — Telephone Encounter (Signed)
Called and lm on confidential vm with VO.

## 2022-01-12 NOTE — Telephone Encounter (Signed)
[phone]. Would like Dr Yong Channel to approve the following home health pt:  1 x  2 wk  2x  4 wk 1x  3w

## 2022-01-13 ENCOUNTER — Ambulatory Visit: Payer: Medicare Other

## 2022-01-13 ENCOUNTER — Encounter (INDEPENDENT_AMBULATORY_CARE_PROVIDER_SITE_OTHER): Payer: Medicare Other | Admitting: Ophthalmology

## 2022-01-13 ENCOUNTER — Other Ambulatory Visit: Payer: Self-pay

## 2022-01-13 DIAGNOSIS — D3132 Benign neoplasm of left choroid: Secondary | ICD-10-CM | POA: Diagnosis not present

## 2022-01-13 DIAGNOSIS — H35033 Hypertensive retinopathy, bilateral: Secondary | ICD-10-CM | POA: Diagnosis not present

## 2022-01-13 DIAGNOSIS — I10 Essential (primary) hypertension: Secondary | ICD-10-CM | POA: Diagnosis not present

## 2022-01-13 DIAGNOSIS — H353231 Exudative age-related macular degeneration, bilateral, with active choroidal neovascularization: Secondary | ICD-10-CM

## 2022-01-13 DIAGNOSIS — H33301 Unspecified retinal break, right eye: Secondary | ICD-10-CM

## 2022-01-13 DIAGNOSIS — H43813 Vitreous degeneration, bilateral: Secondary | ICD-10-CM | POA: Diagnosis not present

## 2022-01-14 ENCOUNTER — Ambulatory Visit (INDEPENDENT_AMBULATORY_CARE_PROVIDER_SITE_OTHER): Payer: Medicare Other | Admitting: Family Medicine

## 2022-01-14 ENCOUNTER — Encounter: Payer: Self-pay | Admitting: Family Medicine

## 2022-01-14 ENCOUNTER — Inpatient Hospital Stay: Payer: Medicare Other | Admitting: Family Medicine

## 2022-01-14 VITALS — BP 120/60 | HR 75 | Temp 98.1°F | Ht 72.0 in | Wt 166.0 lb

## 2022-01-14 DIAGNOSIS — K922 Gastrointestinal hemorrhage, unspecified: Secondary | ICD-10-CM

## 2022-01-14 DIAGNOSIS — E538 Deficiency of other specified B group vitamins: Secondary | ICD-10-CM | POA: Diagnosis not present

## 2022-01-14 DIAGNOSIS — I2510 Atherosclerotic heart disease of native coronary artery without angina pectoris: Secondary | ICD-10-CM | POA: Diagnosis not present

## 2022-01-14 DIAGNOSIS — I493 Ventricular premature depolarization: Secondary | ICD-10-CM | POA: Diagnosis not present

## 2022-01-14 DIAGNOSIS — R338 Other retention of urine: Secondary | ICD-10-CM

## 2022-01-14 DIAGNOSIS — K2970 Gastritis, unspecified, without bleeding: Secondary | ICD-10-CM | POA: Diagnosis not present

## 2022-01-14 DIAGNOSIS — I10 Essential (primary) hypertension: Secondary | ICD-10-CM | POA: Diagnosis not present

## 2022-01-14 DIAGNOSIS — N401 Enlarged prostate with lower urinary tract symptoms: Secondary | ICD-10-CM | POA: Diagnosis not present

## 2022-01-14 DIAGNOSIS — N390 Urinary tract infection, site not specified: Secondary | ICD-10-CM

## 2022-01-14 DIAGNOSIS — I4891 Unspecified atrial fibrillation: Secondary | ICD-10-CM | POA: Diagnosis not present

## 2022-01-14 LAB — CBC WITH DIFFERENTIAL/PLATELET
Basophils Absolute: 0 10*3/uL (ref 0.0–0.1)
Basophils Relative: 0.5 % (ref 0.0–3.0)
Eosinophils Absolute: 0.1 10*3/uL (ref 0.0–0.7)
Eosinophils Relative: 2.2 % (ref 0.0–5.0)
HCT: 38.4 % — ABNORMAL LOW (ref 39.0–52.0)
Hemoglobin: 13.1 g/dL (ref 13.0–17.0)
Lymphocytes Relative: 12 % (ref 12.0–46.0)
Lymphs Abs: 0.7 10*3/uL (ref 0.7–4.0)
MCHC: 34 g/dL (ref 30.0–36.0)
MCV: 92.9 fl (ref 78.0–100.0)
Monocytes Absolute: 0.6 10*3/uL (ref 0.1–1.0)
Monocytes Relative: 10.8 % (ref 3.0–12.0)
Neutro Abs: 4.5 10*3/uL (ref 1.4–7.7)
Neutrophils Relative %: 74.5 % (ref 43.0–77.0)
Platelets: 185 10*3/uL (ref 150.0–400.0)
RBC: 4.13 Mil/uL — ABNORMAL LOW (ref 4.22–5.81)
RDW: 13 % (ref 11.5–15.5)
WBC: 6 10*3/uL (ref 4.0–10.5)

## 2022-01-14 LAB — COMPREHENSIVE METABOLIC PANEL
ALT: 18 U/L (ref 0–53)
AST: 19 U/L (ref 0–37)
Albumin: 3.8 g/dL (ref 3.5–5.2)
Alkaline Phosphatase: 63 U/L (ref 39–117)
BUN: 11 mg/dL (ref 6–23)
CO2: 30 mEq/L (ref 19–32)
Calcium: 9.3 mg/dL (ref 8.4–10.5)
Chloride: 100 mEq/L (ref 96–112)
Creatinine, Ser: 0.65 mg/dL (ref 0.40–1.50)
GFR: 86.14 mL/min (ref >60.00)
Glucose, Bld: 108 mg/dL — ABNORMAL HIGH (ref 70–99)
Potassium: 4.1 mEq/L (ref 3.5–5.1)
Sodium: 137 mEq/L (ref 135–145)
Total Bilirubin: 0.9 mg/dL (ref 0.2–1.2)
Total Protein: 7 g/dL (ref 6.0–8.3)

## 2022-01-14 MED ORDER — CYANOCOBALAMIN 1000 MCG/ML IJ SOLN
1000.0000 ug | Freq: Once | INTRAMUSCULAR | Status: AC
  Administered 2022-01-14: 1000 ug via INTRAMUSCULAR

## 2022-01-14 NOTE — Patient Instructions (Signed)
It was very nice to see you today! ? ?Play with dose of Miralax.  Keep eating and drinking.  ? ? ?PLEASE NOTE: ? ?If you had any lab tests please let us know if you have not heard back within a few days. You may see your results on MyChart before we have a chance to review them but we will give you a call once they are reviewed by [place]. If we ordered any referrals today, please let us know if you have not heard from their office within the next week.  ? ?Please try these tips to maintain a healthy lifestyle: ? ?Eat most of your calories during the day when you are active. Eliminate processed foods including packaged sweets (pies, cakes, cookies), reduce intake of potatoes, white bread, white pasta, and white rice. Look for whole grain options, oat flour or almond flour. ? ?Each meal should contain half fruits/vegetables, one quarter protein, and one quarter carbs (no bigger than a computer mouse). ? ?Cut down on sweet beverages. This includes juice, soda, and sweet tea. Also watch fruit intake, though this is a healthier sweet option, it still contains natural sugar! Limit to 3 servings daily. ? ?Drink at least 1 glass of water with each meal and aim for at least 8 glasses per day ? ?Exercise at least 150 minutes every week.   ?

## 2022-01-14 NOTE — Progress Notes (Signed)
Subjective:     Patient ID: Joseph Simpson, male    DOB: 02-May-1935, 87 y.o.   MRN: [id number]  Chief Complaint  Patient presents with   Hospitalization Follow-up    Need CBC and BMP    HPI-here w/con [facility] in Jan-30stones in bladder-had catheter-removed and "problems began".  On abx for UTI and then.    Hosp f/u-needs repeat labs  Was vomiting for 1 wk total.  01/01/22-seen in ER for N/V/L sided abd pain and constipation.  Hematochizia.  Labs were unremarkable x for UTI still present(had urine cx 2 days prior and was told would need to switch to cipro).  CT abd unremarkable x prostamegaly and nonobs stone on R.  On 2/18-admitted to hosp for hematemesis-had EGD-showed gastritis-placed on carafate/protonix.  Also, new a fib but rate controlled on metoprolol and given GIB-no anticoag.  Has BPH being managed by [facility] Course: 87 year old male with history of nephrolithiasis, squamous cell cancer and melanoma, nonobstructive CAD, GERD/gastroparesis, hypertension, hyperlipidemia, prior gastric surgery for ulcer presented with coffee-ground emesis.  GI was consulted.  He also was found to be in A-fib(chronic).  H/O GIB so not on anticoag.   Cardiology was consulted.  Currently rate controlled, cardiology has signed off.  He underwent EGD on 01/06/2022 which showed LA grade D esophagitis along with gastritis and gastric ulcerations. Subsequently, he has tolerated advanced diet.  He has also completed 5-day course of IV meropenem for ESBL in urine culture and his Foley catheter has been removed.  He will be discharged home today with home health PT.  Outpatient follow-up with PCP/GI/urology and home health PT for deconditioning - coming this am  Card on 3/13, GI TBD-wants [facility], saw urol Monday  Since then, doing well. Urinating well.  Drinking water. Lost 15#.  Grandson lives w/him and son lives next door. Eating better. No constipation/diarrhea.  Taking Miralax daily.   There  are no preventive care reminders to display for this patient.   Past Medical History:  Diagnosis Date   Arthritis    B12 deficiency    Bladder calculi    CAD (coronary artery disease)    cardiologist--- dr Martinique;   abnormal nuclear stress test 11/ 2013;  s/p cardiac cath 10-21-2012 minimal nonobstructiive CAD w/ normal LVF   Dyslipidemia    Full dentures    Gastroparesis    followed by [name] PA ( [facility] in [place]) s/p gastrectomy for ulcers in 1970s   GERD (gastroesophageal reflux disease)    Heart murmur    Hiatal hernia    History of adenomatous polyp of colon    History of kidney stones    History of melanoma 2008   s/p left forearn wide local excision 06/ 2008   History of syncope 2010   cardiologist --- dr Martinique;  (11-25-2021 per pt no syncope or near syncope since)   Hypertension    Macular degeneration of both eyes    Nocturia associated with benign prostatic hyperplasia    OSA (obstructive sleep apnea)    no cpap   Polyneuropathy    PVC's (premature ventricular contractions) 2010   06/ 2010 per event monitor SR w/ occasional PVCs and 4 beat SVT (in epic)   Wears glasses     Past Surgical History:  Procedure Laterality Date   APPENDECTOMY     child   CARDIAC CATHETERIZATION  10/2012   minimal nonobstructive CAD with normal LV function   CATARACT EXTRACTION W/  INTRAOCULAR LENS IMPLANT Bilateral    2009 approx   CYSTOSCOPY WITH LITHOLAPAXY N/A 07/13/2016   Procedure: CYSTOSCOPY WITH LITHOLAPAXY;  Surgeon: Franchot Gallo, MD;  Location: [facility];  Service: Urology;  Laterality: N/A;   CYSTOSCOPY WITH LITHOLAPAXY N/A 12/01/2021   Procedure: CYSTOSCOPY WITH LITHOLAPAXY;  Surgeon: Franchot Gallo, MD;  Location: [facility];  Service: Urology;  Laterality: N/A;   CYSTOSCOPY/RETROGRADE/URETEROSCOPY/STONE EXTRACTION WITH BASKET  02/24/2012   Procedure: CYSTOSCOPY/RETROGRADE/URETEROSCOPY/STONE EXTRACTION WITH BASKET;  Surgeon: Franchot Gallo, MD;  Location: [facility];  Service: Urology;  Laterality: Bilateral;  1 HOUR   ESOPHAGOGASTRODUODENOSCOPY N/A 01/06/2022   Procedure: ESOPHAGOGASTRODUODENOSCOPY (EGD);  Surgeon: Arta Silence, MD;  Location: [facility];  Service: Gastroenterology;  Laterality: N/A;   EXTRACORPOREAL SHOCK WAVE LITHOTRIPSY  11/16/2011   @[facility]    GASTRECTOMY     1970s  for ulcers   HEMORRHOID SURGERY     yrs ago   HOLMIUM LASER APPLICATION N/A 1/61/0960   Procedure: HOLMIUM LASER APPLICATION;  Surgeon: Franchot Gallo, MD;  Location: [facility];  Service: Urology;  Laterality: N/A;   INGUINAL HERNIA REPAIR Left 07/13/2016   Procedure: REPAIR LEFT INGUINAL HERNIA;  Surgeon: Armandina Gemma, MD;  Location: [facility];  Service: General;  Laterality: Left;   INSERTION OF MESH Left 07/13/2016   Procedure: INSERTION OF MESH;  Surgeon: Armandina Gemma, MD;  Location: [facility];  Service: General;  Laterality: Left;   MELANOMA EXCISION Left 05/05/2007   @[facility]  ;   LEFT FOREARM   TONSILLECTOMY     child   TOTAL HIP ARTHROPLASTY Left 02/11/2021   Procedure: TOTAL HIP ARTHROPLASTY;  Surgeon: Marchia Bond, MD;  Location: [facility];  Service: Orthopedics;  Laterality: Left;   TOTAL SHOULDER ARTHROPLASTY Right 12/29/2017   Procedure: TOTAL SHOULDER ARTHROPLASTY;  Surgeon: Hiram Gash, MD;  Location: [facility];  Service: Orthopedics;  Laterality: Right;    Outpatient Medications Prior to Visit  Medication Sig Dispense Refill   acetaminophen (TYLENOL) 500 MG tablet Take 500-1,000 mg by mouth 2 (two) times daily as needed for moderate pain or headache.     atorvastatin (LIPITOR) 20 MG tablet TAKE ONE TABLET BY MOUTH THREE TIMES A WEEK 39 tablet 3   bevacizumab (AVASTIN) 2.5 mg/0.1 mL SOLN 2.5 mg by Intravitreal route. Injection to eyes every 5 weeks - use with vigamox     Carboxymeth-Glycerin-Polysorb (REFRESH OPTIVE ADVANCED OP) Place 1 drop into both eyes daily.     Carboxymethylcellulose  Sodium 1 % GEL Place 1 drop into both eyes at bedtime.     Cyanocobalamin (B-12 COMPLIANCE INJECTION IJ) Inject 1 Dose as directed every 30 (thirty) days.     Empagliflozin (JARDIANCE PO) Take 12.5 mg by mouth daily.     losartan (COZAAR) 25 MG tablet Take 0.5 tablets (12.5 mg total) by mouth daily. 30 tablet 0   metoCLOPramide (REGLAN) 5 MG tablet Take 5 mg by mouth 2 (two) times daily.     metoprolol tartrate (LOPRESSOR) 25 MG tablet Take 1 tablet (25 mg total) by mouth 2 (two) times daily. 60 tablet 0   neomycin-polymyxin b-dexamethasone (MAXITROL) 3.5-10000-0.1 OINT      ondansetron (ZOFRAN-ODT) 4 MG disintegrating tablet Take 1 tablet (4 mg total) by mouth every 8 (eight) hours as needed for nausea or vomiting. 20 tablet 0   pantoprazole (PROTONIX) 40 MG tablet Take 1 tablet by mouth 2 (two) times daily before a meal.     polyethylene glycol (MIRALAX / GLYCOLAX)  17 g packet Take 17 g by mouth daily as needed for moderate constipation.     sucralfate (CARAFATE) 1 g tablet Take 1 tablet (1 g total) by mouth 4 (four) times daily -  with meals and at bedtime. 30 tablet 1   sulfamethoxazole-trimethoprim (BACTRIM DS) 800-160 MG tablet Take 1 tablet by mouth 2 (two) times daily.     tamsulosin (FLOMAX) 0.4 MG CAPS capsule Take 0.4 mg by mouth daily.     triamcinolone cream (KENALOG) 0.1 % Apply 1 application topically 2 (two) times daily.     VIGAMOX 0.5 % ophthalmic solution Place 1 drop into both eyes as directed. Take every 5 weeks as directed - use with avastin, instill 1 drop into both eyes 4 times daily for 2 days after eye injections     pantoprazole (PROTONIX) 40 MG tablet Take 1 tablet (40 mg total) by mouth 2 (two) times daily. 60 tablet 0   No facility-administered medications prior to visit.    Allergies  Allergen Reactions   Aspirin Other (See Comments)    UPSET STOMACH   Percocet [Oxycodone-Acetaminophen] Nausea And Vomiting   Vitamins [Apatate] Other (See Comments)    Bothers  stomach   ROS neg/noncontributory except as noted HPI/below No ha/dizziness/cp/edema/sob      Objective:     BP 120/60    Pulse 75    Temp 98.1 F (36.7 C) (Temporal)    Ht 6' (1.829 m)    Wt 166 lb (75.3 kg)    SpO2 98%    BMI 22.51 kg/m  Wt Readings from Last 3 Encounters:  01/14/22 166 lb (75.3 kg)  01/09/22 165 lb 9.1 oz (75.1 kg)  12/23/21 171 lb (77.6 kg)        Gen: WDWN NAD WM HEENT: NCAT, conjunctiva not injected, sclera nonicteric NECK:  supple, no thyromegaly, no nodes, no carotid bruits CARDIAC: RRR, S1S2+, no murmur. DP 2+B LUNGS: CTAB. No wheezes ABDOMEN:  BS+, soft, NTND, No HSM, no masses EXT:  no edema MSK: no gross abnormalities.  NEURO: A&O x3.  CN II-XII intact.  PSYCH: normal mood. Good eye contact  Spent 53minutes w/pt 68minutes reviewing hosp records.  Assessment & Plan:   Problem List Items Addressed This Visit       Digestive   GI bleed - Primary   Relevant Orders   Ambulatory referral to Gastroenterology   CBC with Differential/Platelet   Comprehensive metabolic panel     Genitourinary   BPH (benign prostatic hyperplasia)   UTI (urinary tract infection)   Relevant Medications   sulfamethoxazole-trimethoprim (BACTRIM DS) 800-160 MG tablet     Other   B12 deficiency   Relevant Medications   cyanocobalamin ((VITAMIN B-12)) injection 1,000 mcg   GIB-upper-stable.  Cont meds.  Refer GI [place]-pt prefers.  Seen in hosp by Dr. Paulita Fujita, but pt not his pt in general.   UTI-finished meds IV in hosp.  Saw GU 2 days ago.  Doing well x for some incont-advised to let GU know. BPH-f/u GU N/v-resolved.  Lost wt, eating better now.   Meds ordered this encounter  Medications   cyanocobalamin ((VITAMIN B-12)) injection 1,000 mcg    Wellington Hampshire, MD

## 2022-01-16 NOTE — Progress Notes (Incomplete)
Phone [phone] In person visit   Subjective:   Joseph Simpson is a 87 y.o. year old very pleasant male patient who presents for/with See problem oriented charting No chief complaint on file.   This visit occurred during the SARS-CoV-2 public health emergency.  Safety protocols were in place, including screening questions prior to the visit, additional usage of staff PPE, and extensive cleaning of exam room while observing appropriate contact time as indicated for disinfecting solutions.   Past Medical History-  Patient Active Problem List   Diagnosis Date Noted   Physical deconditioning 01/08/2022   GI bleed 01/07/2022   UTI (urinary tract infection) 01/07/2022   Protein-calorie malnutrition, moderate ([place]) 01/07/2022   Left wrist pain 10/01/2021   Bilateral foot pain 07/04/2021   S/P hip replacement, left 02/11/2021   Hyperglycemia 12/21/2019   B12 deficiency 03/09/2019   Osteoarthritis of left hip 01/30/2019   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 12/29/2017   Syncope    New onset a-fib ([place])    Gastroparesis 02/15/2017   Hx of adenomatous colonic polyps 02/15/2017   Left inguinal hernia 07/12/2016   Hyperlipidemia 06/10/2016   PAC (premature atrial contraction) 10/25/2015   Squamous cell carcinoma in situ 09/09/2015   BPH (benign prostatic hyperplasia) 11/23/2014   Palpitations 07/31/2014   Cough 05/10/2014   Pulmonary nodule 05/10/2014   Dyspnea 02/27/2014   CAD (coronary artery disease)    Murmur 02/10/2012   Macular degeneration 11/17/2011   Essential hypertension 09/23/2007   GERD 09/23/2007    Medications- reviewed and updated Current Outpatient Medications  Medication Sig Dispense Refill   acetaminophen (TYLENOL) 500 MG tablet Take 500-1,000 mg by mouth 2 (two) times daily as needed for moderate pain or headache.     atorvastatin (LIPITOR) 20 MG tablet TAKE ONE TABLET BY MOUTH THREE TIMES A WEEK 39 tablet 3   bevacizumab (AVASTIN)  2.5 mg/0.1 mL SOLN 2.5 mg by Intravitreal route. Injection to eyes every 5 weeks - use with vigamox     Carboxymeth-Glycerin-Polysorb (REFRESH OPTIVE ADVANCED OP) Place 1 drop into both eyes daily.     Carboxymethylcellulose Sodium 1 % GEL Place 1 drop into both eyes at bedtime.     Cyanocobalamin (B-12 COMPLIANCE INJECTION IJ) Inject 1 Dose as directed every 30 (thirty) days.     Empagliflozin (JARDIANCE PO) Take 12.5 mg by mouth daily.     losartan (COZAAR) 25 MG tablet Take 0.5 tablets (12.5 mg total) by mouth daily. 30 tablet 0   metoCLOPramide (REGLAN) 5 MG tablet Take 5 mg by mouth 2 (two) times daily.     metoprolol tartrate (LOPRESSOR) 25 MG tablet Take 1 tablet (25 mg total) by mouth 2 (two) times daily. 60 tablet 0   neomycin-polymyxin b-dexamethasone (MAXITROL) 3.5-10000-0.1 OINT      ondansetron (ZOFRAN-ODT) 4 MG disintegrating tablet Take 1 tablet (4 mg total) by mouth every 8 (eight) hours as needed for nausea or vomiting. 20 tablet 0   pantoprazole (PROTONIX) 40 MG tablet Take 1 tablet by mouth 2 (two) times daily before a meal.     polyethylene glycol (MIRALAX / GLYCOLAX) 17 g packet Take 17 g by mouth daily as needed for moderate constipation.     sucralfate (CARAFATE) 1 g tablet Take 1 tablet (1 g total) by mouth 4 (four) times daily -  with meals and at bedtime. 30 tablet 1   sulfamethoxazole-trimethoprim (BACTRIM DS) 800-160 MG tablet Take 1 tablet by mouth 2 (two) times daily.  tamsulosin (FLOMAX) 0.4 MG CAPS capsule Take 0.4 mg by mouth daily.     triamcinolone cream (KENALOG) 0.1 % Apply 1 application topically 2 (two) times daily.     VIGAMOX 0.5 % ophthalmic solution Place 1 drop into both eyes as directed. Take every 5 weeks as directed - use with avastin, instill 1 drop into both eyes 4 times daily for 2 days after eye injections     No current facility-administered medications for this visit.     Objective:  There were no vitals taken for this visit. Gen: NAD,  resting comfortably CV: RRR no murmurs rubs or gallops Lungs: CTAB no crackles, wheeze, rhonchi Abdomen: soft/nontender/nondistended/normal bowel sounds. No rebound or guarding.  Ext: no edema Skin: warm, dry Neuro: grossly normal, moves all extremities  ***    Assessment and Plan   ***08/29/20 awv  #Hypertension  S: Controlled on losartan 25 mg twice daily, metoprolol 25 mg twice daily (also helped palpitations- reduced from full tablet due to bradycardia 06/2021), - amlodipine 1.25 mg (prior orthostatic hypotension- but restart lower than 2.5 mg on 06/2021 due to poor control BP) Home readings #s: *** BP Readings from Last 3 Encounters:  01/14/22 120/60  01/09/22 (!) 145/70  01/01/22 136/83  A/P: ***  #Hyperlipidemia/CAD S: Patient had minimally obstructive CAD on cath 2013. Cannot use aspirin with prior one third removal of stomach-follows with Dr. Martinique .   Currently listed as atorvastatin 20 mg tablet-half tablet three times a week -we used atorvastatin 20 mg daily around the time of his shoulder surgery but he has declined otherwise. LDL goal at least under 100 but would prefer under 70-he is above the goal on last check Lab Results  Component Value Date   CHOL 125 12/25/2020   HDL 37.10 (L) 12/25/2020   LDLCALC 65 12/25/2020   LDLDIRECT 83 06/19/2020   TRIG 114.0 12/25/2020   CHOLHDL 3 12/25/2020   A/P: ***  #GERD- follows with Dr. Erlene Quan in [place] S: Patient is compliant with high-dose PPI-40 mg twice a day per Dr. Erlene Quan of GI- retired and will see new provider. Also on Reglan. B12 levels related to PPI use: Lab Results  Component Value Date   VITAMINB12 1,040 (H) 01/07/2022   A/P: ***   #BPH- Dr. Diona Fanti urology S: Patient remains on tamsulosin 0.4 mg daily as needed through urology A/P: ***  # Hyperglycemia/insulin resistance/prediabetes- peak a1c 6.1 S: Medication: *** Exercise and diet- *** Lab Results  Component Value Date   HGBA1C 6.0  06/27/2021   HGBA1C 5.7 12/25/2020   HGBA1C 6.0 (H) 06/19/2020    A/P: ***  Recommended follow up: No follow-ups on file. Future Appointments  Date Time Provider [place]  01/26/2022 10:00 AM Martinique, Peter M, MD [facility]  01/27/2022 10:40 AM Marin Olp, MD [facility]  02/11/2022  7:45 AM Hayden Pedro, MD [facility] None  03/17/2022  3:45 PM [facility] CCM PHARMACIST [facility]  09/17/2022  8:45 AM [facility] HEALTH COACH [facility]    Lab/Order associations: No diagnosis found.  No orders of the defined types were placed in this encounter.   I,Jada Bradford,acting as a scribe for Garret Reddish, MD.,have documented all relevant documentation on the behalf of Garret Reddish, MD,as directed by  Garret Reddish, MD while in the presence of Garret Reddish, MD.  *** Return precautions advised.  Burnett Corrente

## 2022-01-19 ENCOUNTER — Telehealth: Payer: Self-pay

## 2022-01-19 ENCOUNTER — Encounter (INDEPENDENT_AMBULATORY_CARE_PROVIDER_SITE_OTHER): Payer: Medicare Other | Admitting: Ophthalmology

## 2022-01-19 DIAGNOSIS — Z9181 History of falling: Secondary | ICD-10-CM

## 2022-01-19 DIAGNOSIS — Z85828 Personal history of other malignant neoplasm of skin: Secondary | ICD-10-CM

## 2022-01-19 DIAGNOSIS — Z85820 Personal history of malignant melanoma of skin: Secondary | ICD-10-CM

## 2022-01-19 DIAGNOSIS — R338 Other retention of urine: Secondary | ICD-10-CM | POA: Diagnosis not present

## 2022-01-19 DIAGNOSIS — N401 Enlarged prostate with lower urinary tract symptoms: Secondary | ICD-10-CM | POA: Diagnosis not present

## 2022-01-19 DIAGNOSIS — K2970 Gastritis, unspecified, without bleeding: Secondary | ICD-10-CM | POA: Diagnosis not present

## 2022-01-19 DIAGNOSIS — H3530 Unspecified macular degeneration: Secondary | ICD-10-CM

## 2022-01-19 DIAGNOSIS — N200 Calculus of kidney: Secondary | ICD-10-CM | POA: Diagnosis not present

## 2022-01-19 DIAGNOSIS — K449 Diaphragmatic hernia without obstruction or gangrene: Secondary | ICD-10-CM | POA: Diagnosis not present

## 2022-01-19 DIAGNOSIS — E440 Moderate protein-calorie malnutrition: Secondary | ICD-10-CM

## 2022-01-19 DIAGNOSIS — E785 Hyperlipidemia, unspecified: Secondary | ICD-10-CM

## 2022-01-19 DIAGNOSIS — K5730 Diverticulosis of large intestine without perforation or abscess without bleeding: Secondary | ICD-10-CM | POA: Diagnosis not present

## 2022-01-19 DIAGNOSIS — I2510 Atherosclerotic heart disease of native coronary artery without angina pectoris: Secondary | ICD-10-CM | POA: Diagnosis not present

## 2022-01-19 DIAGNOSIS — Z87891 Personal history of nicotine dependence: Secondary | ICD-10-CM

## 2022-01-19 DIAGNOSIS — K922 Gastrointestinal hemorrhage, unspecified: Secondary | ICD-10-CM | POA: Diagnosis not present

## 2022-01-19 DIAGNOSIS — G4733 Obstructive sleep apnea (adult) (pediatric): Secondary | ICD-10-CM

## 2022-01-19 DIAGNOSIS — I493 Ventricular premature depolarization: Secondary | ICD-10-CM | POA: Diagnosis not present

## 2022-01-19 DIAGNOSIS — K259 Gastric ulcer, unspecified as acute or chronic, without hemorrhage or perforation: Secondary | ICD-10-CM | POA: Diagnosis not present

## 2022-01-19 DIAGNOSIS — Z87440 Personal history of urinary (tract) infections: Secondary | ICD-10-CM

## 2022-01-19 DIAGNOSIS — E538 Deficiency of other specified B group vitamins: Secondary | ICD-10-CM

## 2022-01-19 DIAGNOSIS — Z96612 Presence of left artificial shoulder joint: Secondary | ICD-10-CM

## 2022-01-19 DIAGNOSIS — M1990 Unspecified osteoarthritis, unspecified site: Secondary | ICD-10-CM

## 2022-01-19 DIAGNOSIS — I10 Essential (primary) hypertension: Secondary | ICD-10-CM | POA: Diagnosis not present

## 2022-01-19 DIAGNOSIS — I4891 Unspecified atrial fibrillation: Secondary | ICD-10-CM | POA: Diagnosis not present

## 2022-01-19 DIAGNOSIS — K219 Gastro-esophageal reflux disease without esophagitis: Secondary | ICD-10-CM

## 2022-01-19 NOTE — Telephone Encounter (Signed)
Home Health Certification or Plan of Care Tracking ? ?Is this a Certification or Plan of Care? ? ?[facility]: [facility]  ? ?Order Number:  [id number] ? ?Has charge sheet been attached? ? ?Where has form been placed:  [facility] folder  ? ?Faxed to:  [phone] ?

## 2022-01-19 NOTE — Telephone Encounter (Signed)
Noted  

## 2022-01-20 DIAGNOSIS — I4891 Unspecified atrial fibrillation: Secondary | ICD-10-CM | POA: Diagnosis not present

## 2022-01-20 DIAGNOSIS — K2970 Gastritis, unspecified, without bleeding: Secondary | ICD-10-CM | POA: Diagnosis not present

## 2022-01-20 DIAGNOSIS — I2510 Atherosclerotic heart disease of native coronary artery without angina pectoris: Secondary | ICD-10-CM | POA: Diagnosis not present

## 2022-01-20 DIAGNOSIS — I493 Ventricular premature depolarization: Secondary | ICD-10-CM | POA: Diagnosis not present

## 2022-01-20 DIAGNOSIS — K922 Gastrointestinal hemorrhage, unspecified: Secondary | ICD-10-CM | POA: Diagnosis not present

## 2022-01-20 DIAGNOSIS — I10 Essential (primary) hypertension: Secondary | ICD-10-CM | POA: Diagnosis not present

## 2022-01-21 NOTE — Progress Notes (Signed)
Cardiology Office Note:    Date:  01/26/2022   ID:  Joseph Simpson, DOB 03/29/1935, MRN [id number]  PCP:  Marin Olp, MD   [facility] Providers Cardiologist:  Joleena Weisenburger [place], MD     Referring MD: Marin Olp, MD   Chief Complaint  Patient presents with   Atrial Fibrillation    History of Present Illness:    Joseph Simpson is a 87 y.o. male with a hx of hypertension, PVCs, obstructive sleep apnea not on CPAP, presyncope, and nonobstructive CAD.  He had a abnormal stress test in 2013, follow-up cardiac catheterization demonstrated nonobstructive CAD.  He was last seen by Kerin Ransom in December 2021 for preoperative clearance prior to left hip replacement.  He was cleared without further work-up.  He was seen by his PCP Dr. Yong Channel on 06/27/2021, heart rate at the time was 40 bpm.  His metoprolol was cut back. He was placed on amlodipine but developed dizziness and this was stopped.   He was hospitalized last month with coffee ground emesis and UTI. He was treated with IV antibiotics. EGD showed esophagitis and gastritis with some gastric ulcerations. He was placed on Carafate in addition to Protonix. He was found to be in Afib with controlled rate. Echo was unremarkable. He was not started on anticoagulation due to GI bleed. Hgb was stable at 13.1. Patient reports he was seen by Urology this past week and still had infection. Placed on another course of antibiotics. At home he has noted Pulse rate is faster. Typically 75-90. BP is well controlled. Notes some skipping in heart rhythm. No dizziness, chest pain or dyspnea. Energy level is improved.     Past Medical History:  Diagnosis Date   Arthritis    B12 deficiency    Bladder calculi    CAD (coronary artery disease)    cardiologist--- dr Martinique;   abnormal nuclear stress test 11/ 2013;  s/p cardiac cath 10-21-2012 minimal nonobstructiive CAD w/ normal LVF   Dyslipidemia    Full dentures    Gastroparesis    followed by  [name] PA ( [facility] in [place]) s/p gastrectomy for ulcers in 1970s   GERD (gastroesophageal reflux disease)    Heart murmur    Hiatal hernia    History of adenomatous polyp of colon    History of kidney stones    History of melanoma 2008   s/p left forearn wide local excision 06/ 2008   History of syncope 2010   cardiologist --- dr Martinique;  (11-25-2021 per pt no syncope or near syncope since)   Hypertension    Macular degeneration of both eyes    Nocturia associated with benign prostatic hyperplasia    OSA (obstructive sleep apnea)    no cpap   Polyneuropathy    PVC's (premature ventricular contractions) 2010   06/ 2010 per event monitor SR w/ occasional PVCs and 4 beat SVT (in epic)   Wears glasses     Past Surgical History:  Procedure Laterality Date   APPENDECTOMY     child   CARDIAC CATHETERIZATION  10/2012   minimal nonobstructive CAD with normal LV function   CATARACT EXTRACTION W/ INTRAOCULAR LENS IMPLANT Bilateral    2009 approx   CYSTOSCOPY WITH LITHOLAPAXY N/A 07/13/2016   Procedure: CYSTOSCOPY WITH LITHOLAPAXY;  Surgeon: Franchot Gallo, MD;  Location: [facility];  Service: Urology;  Laterality: N/A;   CYSTOSCOPY WITH LITHOLAPAXY N/A 12/01/2021   Procedure: CYSTOSCOPY WITH LITHOLAPAXY;  Surgeon:  Franchot Gallo, MD;  Location: [facility];  Service: Urology;  Laterality: N/A;   CYSTOSCOPY/RETROGRADE/URETEROSCOPY/STONE EXTRACTION WITH BASKET  02/24/2012   Procedure: CYSTOSCOPY/RETROGRADE/URETEROSCOPY/STONE EXTRACTION WITH BASKET;  Surgeon: Franchot Gallo, MD;  Location: [facility];  Service: Urology;  Laterality: Bilateral;  1 HOUR   ESOPHAGOGASTRODUODENOSCOPY N/A 01/06/2022   Procedure: ESOPHAGOGASTRODUODENOSCOPY (EGD);  Surgeon: Arta Silence, MD;  Location: [facility];  Service: Gastroenterology;  Laterality: N/A;   EXTRACORPOREAL SHOCK WAVE LITHOTRIPSY  11/16/2011   '@[facility]'$    GASTRECTOMY     1970s  for ulcers    HEMORRHOID SURGERY     yrs ago   HOLMIUM LASER APPLICATION N/A 8/58/8502   Procedure: HOLMIUM LASER APPLICATION;  Surgeon: Franchot Gallo, MD;  Location: [facility];  Service: Urology;  Laterality: N/A;   INGUINAL HERNIA REPAIR Left 07/13/2016   Procedure: REPAIR LEFT INGUINAL HERNIA;  Surgeon: Armandina Gemma, MD;  Location: [facility];  Service: General;  Laterality: Left;   INSERTION OF MESH Left 07/13/2016   Procedure: INSERTION OF MESH;  Surgeon: Armandina Gemma, MD;  Location: [facility];  Service: General;  Laterality: Left;   MELANOMA EXCISION Left 05/05/2007   '@[facility]'$  ;   LEFT FOREARM   TONSILLECTOMY     child   TOTAL HIP ARTHROPLASTY Left 02/11/2021   Procedure: TOTAL HIP ARTHROPLASTY;  Surgeon: Marchia Bond, MD;  Location: [facility];  Service: Orthopedics;  Laterality: Left;   TOTAL SHOULDER ARTHROPLASTY Right 12/29/2017   Procedure: TOTAL SHOULDER ARTHROPLASTY;  Surgeon: Hiram Gash, MD;  Location: [place];  Service: Orthopedics;  Laterality: Right;    Current Medications: Current Meds  Medication Sig   acetaminophen (TYLENOL) 500 MG tablet Take 500-1,000 mg by mouth 2 (two) times daily as needed for moderate pain or headache.   atorvastatin (LIPITOR) 20 MG tablet TAKE ONE TABLET BY MOUTH THREE TIMES A WEEK   bevacizumab (AVASTIN) 2.5 mg/0.1 mL SOLN 2.5 mg by Intravitreal route. Injection to eyes every 5 weeks - use with vigamox   Carboxymeth-Glycerin-Polysorb (REFRESH OPTIVE ADVANCED OP) Place 1 drop into both eyes daily.   Carboxymethylcellulose Sodium 1 % GEL Place 1 drop into both eyes at bedtime.   Cyanocobalamin (B-12 COMPLIANCE INJECTION IJ) Inject 1 Dose as directed every 30 (thirty) days.   Empagliflozin (JARDIANCE PO) Take 12.5 mg by mouth daily.   losartan (COZAAR) 25 MG tablet Take 0.5 tablets (12.5 mg total) by mouth daily.   metoCLOPramide (REGLAN) 5 MG tablet Take 5 mg by mouth 2 (two) times daily.   metoprolol tartrate (LOPRESSOR) 25 MG tablet Take 1 tablet  (25 mg total) by mouth 2 (two) times daily.   neomycin-polymyxin b-dexamethasone (MAXITROL) 3.5-10000-0.1 OINT    ondansetron (ZOFRAN-ODT) 4 MG disintegrating tablet Take 1 tablet (4 mg total) by mouth every 8 (eight) hours as needed for nausea or vomiting.   pantoprazole (PROTONIX) 40 MG tablet Take 1 tablet by mouth 2 (two) times daily before a meal.   polyethylene glycol (MIRALAX / GLYCOLAX) 17 g packet Take 17 g by mouth daily as needed for moderate constipation.   sucralfate (CARAFATE) 1 g tablet Take 1 tablet (1 g total) by mouth 4 (four) times daily -  with meals and at bedtime.   sulfamethoxazole-trimethoprim (BACTRIM DS) 800-160 MG tablet Take 1 tablet by mouth 2 (two) times daily.   tamsulosin (FLOMAX) 0.4 MG CAPS capsule Take 0.4 mg by mouth daily.   triamcinolone cream (KENALOG) 0.1 % Apply 1 application topically 2 (two) times  daily.   VIGAMOX 0.5 % ophthalmic solution Place 1 drop into both eyes as directed. Take every 5 weeks as directed - use with avastin, instill 1 drop into both eyes 4 times daily for 2 days after eye injections     Allergies:   Aspirin, Percocet [oxycodone-acetaminophen], and Vitamins [apatate]   Social History   Socioeconomic History   Marital status: Widowed    Spouse name: Not on file   Number of children: 1   Years of education: Not on file   Highest education level: Not on file  Occupational History   Occupation: retired  Tobacco Use   Smoking status: Former    Packs/day: 0.10    Years: 4.00    Pack years: 0.40    Types: Cigarettes    Quit date: 09/03/1956    Years since quitting: 65.4   Smokeless tobacco: Never  Vaping Use   Vaping Use: Never used  Substance and Sexual Activity   Alcohol use: No    Alcohol/week: 0.0 standard drinks   Drug use: Never   Sexual activity: Yes  Other Topics Concern   Not on file  Social History Narrative   Widowed (lost wife 2015 from lung cancer never smoker), 1 son, 2 grandkids, 1 greatgrandkid (born  on Christmas)      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       Hobbies: ride motorcycle (slingshot 3 year), rabbit and dear hunt   Social Determinants of Radio broadcast assistant Strain: Low Risk    Difficulty of Paying Living Expenses: Not hard at all  Food Insecurity: No Food Insecurity   Worried About Charity fundraiser in the Last Year: Never true   Arboriculturist in the Last Year: Never true  Transportation Needs: No Transportation Needs   Lack of Transportation (Medical): No   Lack of Transportation (Non-Medical): No  Physical Activity: Sufficiently Active   Days of Exercise per Week: 5 days   Minutes of Exercise per Session: 30 min  Stress: No Stress Concern Present   Feeling of Stress : Not at all  Social Connections: Moderately Isolated   Frequency of Communication with Friends and Family: More than three times a week   Frequency of Social Gatherings with Friends and Family: More than three times a week   Attends Religious Services: More than 4 times per year   Active Member of [employer] or Organizations: No   Attends Archivist Meetings: Never   Marital Status: Widowed     Family History: The patient's family history includes Cancer in his sister; Heart disease in his mother; Lung cancer in his brother; Pneumonia in his father.  ROS:   Please see the history of present illness.     All other systems reviewed and are negative.  EKGs/Labs/Other Studies Reviewed:    The following studies were reviewed today:  Echo 04/16/2009  1. Left ventricle: The cavity size was normal. Wall thickness was      increased in a pattern of mild LVH. Systolic function was normal.      The estimated ejection fraction was in the range of 55% to 60%.      Wall motion was normal; there were no regional wall motion      abnormalities.   2. Aortic valve: Trivial regurgitation.   Echo 01/05/22: IMPRESSIONS     1. Left ventricular ejection fraction, by  estimation, is 60 to 65%. The  left  ventricle has normal function. The left ventricle has no regional  wall motion abnormalities. Left ventricular diastolic function could not  be evaluated.   2. Right ventricular systolic function is normal. The right ventricular  size is normal. There is normal pulmonary artery systolic pressure.   3. Left atrial size was mildly dilated.   4. The mitral valve is normal in structure. Mild mitral valve  regurgitation. No evidence of mitral stenosis.   5. The aortic valve is normal in structure. There is mild calcification  of the aortic valve. There is mild thickening of the aortic valve. Aortic  valve regurgitation is not visualized.   EKG:  EKG is ordered today.  The ekg ordered today demonstrates normal sinus rhythm, first degree AV block, PACs. no significant ST-T wave changes. I have personally reviewed and interpreted this study.   Recent Labs: 01/05/2022: TSH 1.731 01/09/2022: Magnesium 2.0 01/14/2022: ALT 18; BUN 11; Creatinine, Ser 0.65; Hemoglobin 13.1; Platelets 185.0; Potassium 4.1; Sodium 137  Recent Lipid Panel    Component Value Date/Time   CHOL 125 12/25/2020 1015   TRIG 114.0 12/25/2020 1015   HDL 37.10 (L) 12/25/2020 1015   CHOLHDL 3 12/25/2020 1015   VLDL 22.8 12/25/2020 1015   LDLCALC 65 12/25/2020 1015   LDLDIRECT 83 06/19/2020 1033     Risk Assessment/Calculations:           Physical Exam:    VS:  BP (!) 128/52    Pulse (!) 50    Ht 6' (1.829 m)    Wt 168 lb 9.6 oz (76.5 kg)    SpO2 94%    BMI 22.87 kg/m     Wt Readings from Last 3 Encounters:  01/26/22 168 lb 9.6 oz (76.5 kg)  01/14/22 166 lb (75.3 kg)  01/09/22 165 lb 9.1 oz (75.1 kg)     GEN:  Well nourished, well developed in no acute distress HEENT: Normal NECK: No JVD; No carotid bruits LYMPHATICS: No lymphadenopathy CARDIAC: RRR, no murmurs, rubs, gallops RESPIRATORY:  Clear to auscultation without rales, wheezing or rhonchi  ABDOMEN: Soft, non-tender,  non-distended MUSCULOSKELETAL:  No edema; No deformity  SKIN: Warm and dry NEUROLOGIC:  Alert and oriented x 3 PSYCHIATRIC:  Normal affect   ASSESSMENT:    1. Paroxysmal atrial fibrillation (HCC)   2. Coronary artery disease involving native coronary artery of native heart without angina pectoris   3. Hyperlipidemia LDL goal <70   4. Primary hypertension     PLAN:    In order of problems listed above:  CAD: History of nonobstructive CAD on previous cath.  Continue Lipitor.  Denies any recent chest pain  Hypertension: losartan dose recently reduced during his hospital stay. BP is good now.  Hyperlipidemia: Continue Lipitor.  4.   Paroxysmal AFib - now in NSR. Suspect related to stress of other illness. [place] vasc score of 4. Recommend 2 week Zio patch monitor. If he does have recurrent Afib will need to consider adding Eliquis. Would avoid ASA with gastric ulceration.          Medication Adjustments/Labs and Tests Ordered: Current medicines are reviewed at length with the patient today.  Concerns regarding medicines are outlined above.  No orders of the defined types were placed in this encounter.  No orders of the defined types were placed in this encounter.    There are no Patient Instructions on file for this visit.   Signed, Dagmawi Venable Martinique, MD  01/26/2022 10:24 AM  [facility]

## 2022-01-22 DIAGNOSIS — N3000 Acute cystitis without hematuria: Secondary | ICD-10-CM | POA: Diagnosis not present

## 2022-01-22 DIAGNOSIS — R8271 Bacteriuria: Secondary | ICD-10-CM | POA: Diagnosis not present

## 2022-01-23 DIAGNOSIS — K2970 Gastritis, unspecified, without bleeding: Secondary | ICD-10-CM | POA: Diagnosis not present

## 2022-01-23 DIAGNOSIS — I493 Ventricular premature depolarization: Secondary | ICD-10-CM | POA: Diagnosis not present

## 2022-01-23 DIAGNOSIS — I10 Essential (primary) hypertension: Secondary | ICD-10-CM | POA: Diagnosis not present

## 2022-01-23 DIAGNOSIS — K922 Gastrointestinal hemorrhage, unspecified: Secondary | ICD-10-CM | POA: Diagnosis not present

## 2022-01-23 DIAGNOSIS — I4891 Unspecified atrial fibrillation: Secondary | ICD-10-CM | POA: Diagnosis not present

## 2022-01-23 DIAGNOSIS — I2510 Atherosclerotic heart disease of native coronary artery without angina pectoris: Secondary | ICD-10-CM | POA: Diagnosis not present

## 2022-01-26 ENCOUNTER — Ambulatory Visit (INDEPENDENT_AMBULATORY_CARE_PROVIDER_SITE_OTHER): Payer: Medicare Other

## 2022-01-26 ENCOUNTER — Ambulatory Visit (INDEPENDENT_AMBULATORY_CARE_PROVIDER_SITE_OTHER): Payer: Medicare Other | Admitting: Cardiology

## 2022-01-26 ENCOUNTER — Encounter: Payer: Self-pay | Admitting: Cardiology

## 2022-01-26 ENCOUNTER — Other Ambulatory Visit: Payer: Self-pay

## 2022-01-26 VITALS — BP 128/52 | HR 50 | Ht 72.0 in | Wt 168.6 lb

## 2022-01-26 DIAGNOSIS — I480 Paroxysmal atrial fibrillation: Secondary | ICD-10-CM

## 2022-01-26 DIAGNOSIS — I2510 Atherosclerotic heart disease of native coronary artery without angina pectoris: Secondary | ICD-10-CM

## 2022-01-26 DIAGNOSIS — I10 Essential (primary) hypertension: Secondary | ICD-10-CM

## 2022-01-26 DIAGNOSIS — E785 Hyperlipidemia, unspecified: Secondary | ICD-10-CM | POA: Diagnosis not present

## 2022-01-26 NOTE — Patient Instructions (Signed)
Medication Instructions:  ?Continue same medications ?*If you need a refill on your cardiac medications before your next appointment, please call your pharmacy* ? ? ?Lab Work: ?None ordered ? ? ?Testing/Procedures: ?2 week Zio Heart Monitor ? ? ?Follow-Up: ?At [facility], you and your health needs are our priority.  As part of our continuing mission to provide you with exceptional heart care, we have created designated Provider Care Teams.  These Care Teams include your primary Cardiologist (physician) and Advanced Practice Providers (APPs -  Physician Assistants and Nurse Practitioners) who all work together to provide you with the care you need, when you need it. ? ?We recommend signing up for the patient portal called "MyChart".  Sign up information is provided on this After Visit Summary.  MyChart is used to connect with patients for Virtual Visits (Telemedicine).  Patients are able to view lab/test results, encounter notes, upcoming appointments, etc.  Non-urgent messages can be sent to your provider as well.   ?To learn more about what you can do with MyChart, go to [email or web address].   ? ?Your next appointment:  6 months ?  ? ?The format for your next appointment: Office  ? ? ?Provider:  Dr.Jordan ? ? ? ?

## 2022-01-26 NOTE — Addendum Note (Signed)
Addended by: Kathyrn Lass on: 01/26/2022 11:17 AM ? ? Modules accepted: Orders ? ?

## 2022-01-26 NOTE — Addendum Note (Signed)
Addended by: Kathyrn Lass on: 01/26/2022 10:30 AM ? ? Modules accepted: Orders ? ?

## 2022-01-26 NOTE — Progress Notes (Unsigned)
Enrolled for [employer] to mail a ZIO XT long term holter monitor to the patients address on file.  

## 2022-01-27 ENCOUNTER — Ambulatory Visit (INDEPENDENT_AMBULATORY_CARE_PROVIDER_SITE_OTHER): Payer: Medicare Other | Admitting: Family Medicine

## 2022-01-27 ENCOUNTER — Encounter: Payer: Self-pay | Admitting: Family Medicine

## 2022-01-27 VITALS — BP 120/60 | HR 52 | Temp 97.4°F | Ht 72.0 in | Wt 170.0 lb

## 2022-01-27 DIAGNOSIS — R739 Hyperglycemia, unspecified: Secondary | ICD-10-CM

## 2022-01-27 DIAGNOSIS — N401 Enlarged prostate with lower urinary tract symptoms: Secondary | ICD-10-CM

## 2022-01-27 DIAGNOSIS — R338 Other retention of urine: Secondary | ICD-10-CM | POA: Diagnosis not present

## 2022-01-27 DIAGNOSIS — K2970 Gastritis, unspecified, without bleeding: Secondary | ICD-10-CM | POA: Diagnosis not present

## 2022-01-27 DIAGNOSIS — I2510 Atherosclerotic heart disease of native coronary artery without angina pectoris: Secondary | ICD-10-CM

## 2022-01-27 DIAGNOSIS — E785 Hyperlipidemia, unspecified: Secondary | ICD-10-CM

## 2022-01-27 DIAGNOSIS — I10 Essential (primary) hypertension: Secondary | ICD-10-CM | POA: Diagnosis not present

## 2022-01-27 DIAGNOSIS — K219 Gastro-esophageal reflux disease without esophagitis: Secondary | ICD-10-CM

## 2022-01-27 DIAGNOSIS — K922 Gastrointestinal hemorrhage, unspecified: Secondary | ICD-10-CM | POA: Diagnosis not present

## 2022-01-27 DIAGNOSIS — I4891 Unspecified atrial fibrillation: Secondary | ICD-10-CM | POA: Diagnosis not present

## 2022-01-27 DIAGNOSIS — I493 Ventricular premature depolarization: Secondary | ICD-10-CM | POA: Diagnosis not present

## 2022-01-27 LAB — LIPID PANEL
Cholesterol: 113 mg/dL (ref 0–200)
HDL: 37.3 mg/dL — ABNORMAL LOW (ref >39.00)
LDL Cholesterol: 49 mg/dL (ref 0–99)
NonHDL: 75.87
Total CHOL/HDL Ratio: 3
Triglycerides: 132 mg/dL (ref 0.0–149.0)
VLDL: 26.4 mg/dL (ref 0.0–40.0)

## 2022-01-27 LAB — HEMOGLOBIN A1C: Hgb A1c MFr Bld: 5.8 % (ref 4.6–6.5)

## 2022-01-27 NOTE — Patient Instructions (Addendum)
Please stop by lab before you go ?If you have mychart- we will send your results within 3 business days of [place] receiving them.  ?If you do not have mychart- we will call you about results within 5 business days of [place] receiving them.  ?*please also note that you will see labs on mychart as soon as they post. I will later go in and write notes on them- will say "notes from Dr. Yong Channel"  ? ?No changes today unless labs lead [place] to make changes ? ?Hope you continue to feel better so the slingshot doesn't feel lonely!  ? ?Recommended follow up: Return in about 6 months (around 07/30/2022) for follow up- or sooner if needed. ?

## 2022-01-29 DIAGNOSIS — I493 Ventricular premature depolarization: Secondary | ICD-10-CM | POA: Diagnosis not present

## 2022-01-29 DIAGNOSIS — K2970 Gastritis, unspecified, without bleeding: Secondary | ICD-10-CM | POA: Diagnosis not present

## 2022-01-29 DIAGNOSIS — I4891 Unspecified atrial fibrillation: Secondary | ICD-10-CM | POA: Diagnosis not present

## 2022-01-29 DIAGNOSIS — I10 Essential (primary) hypertension: Secondary | ICD-10-CM | POA: Diagnosis not present

## 2022-01-29 DIAGNOSIS — I2510 Atherosclerotic heart disease of native coronary artery without angina pectoris: Secondary | ICD-10-CM | POA: Diagnosis not present

## 2022-01-29 DIAGNOSIS — K922 Gastrointestinal hemorrhage, unspecified: Secondary | ICD-10-CM | POA: Diagnosis not present

## 2022-01-30 DIAGNOSIS — I480 Paroxysmal atrial fibrillation: Secondary | ICD-10-CM | POA: Diagnosis not present

## 2022-01-30 DIAGNOSIS — E785 Hyperlipidemia, unspecified: Secondary | ICD-10-CM

## 2022-01-30 DIAGNOSIS — I2510 Atherosclerotic heart disease of native coronary artery without angina pectoris: Secondary | ICD-10-CM | POA: Diagnosis not present

## 2022-01-30 DIAGNOSIS — I10 Essential (primary) hypertension: Secondary | ICD-10-CM

## 2022-02-03 DIAGNOSIS — I2510 Atherosclerotic heart disease of native coronary artery without angina pectoris: Secondary | ICD-10-CM | POA: Diagnosis not present

## 2022-02-03 DIAGNOSIS — I493 Ventricular premature depolarization: Secondary | ICD-10-CM | POA: Diagnosis not present

## 2022-02-03 DIAGNOSIS — K2970 Gastritis, unspecified, without bleeding: Secondary | ICD-10-CM | POA: Diagnosis not present

## 2022-02-03 DIAGNOSIS — I10 Essential (primary) hypertension: Secondary | ICD-10-CM | POA: Diagnosis not present

## 2022-02-03 DIAGNOSIS — K922 Gastrointestinal hemorrhage, unspecified: Secondary | ICD-10-CM | POA: Diagnosis not present

## 2022-02-03 DIAGNOSIS — I4891 Unspecified atrial fibrillation: Secondary | ICD-10-CM | POA: Diagnosis not present

## 2022-02-05 DIAGNOSIS — I4891 Unspecified atrial fibrillation: Secondary | ICD-10-CM | POA: Diagnosis not present

## 2022-02-05 DIAGNOSIS — K2970 Gastritis, unspecified, without bleeding: Secondary | ICD-10-CM | POA: Diagnosis not present

## 2022-02-05 DIAGNOSIS — I2510 Atherosclerotic heart disease of native coronary artery without angina pectoris: Secondary | ICD-10-CM | POA: Diagnosis not present

## 2022-02-05 DIAGNOSIS — K922 Gastrointestinal hemorrhage, unspecified: Secondary | ICD-10-CM | POA: Diagnosis not present

## 2022-02-05 DIAGNOSIS — I493 Ventricular premature depolarization: Secondary | ICD-10-CM | POA: Diagnosis not present

## 2022-02-05 DIAGNOSIS — I10 Essential (primary) hypertension: Secondary | ICD-10-CM | POA: Diagnosis not present

## 2022-02-09 DIAGNOSIS — E440 Moderate protein-calorie malnutrition: Secondary | ICD-10-CM | POA: Diagnosis not present

## 2022-02-09 DIAGNOSIS — R338 Other retention of urine: Secondary | ICD-10-CM | POA: Diagnosis not present

## 2022-02-09 DIAGNOSIS — I4891 Unspecified atrial fibrillation: Secondary | ICD-10-CM | POA: Diagnosis not present

## 2022-02-09 DIAGNOSIS — Z87891 Personal history of nicotine dependence: Secondary | ICD-10-CM | POA: Diagnosis not present

## 2022-02-09 DIAGNOSIS — Z9181 History of falling: Secondary | ICD-10-CM | POA: Diagnosis not present

## 2022-02-09 DIAGNOSIS — E538 Deficiency of other specified B group vitamins: Secondary | ICD-10-CM | POA: Diagnosis not present

## 2022-02-09 DIAGNOSIS — I493 Ventricular premature depolarization: Secondary | ICD-10-CM | POA: Diagnosis not present

## 2022-02-09 DIAGNOSIS — Z87440 Personal history of urinary (tract) infections: Secondary | ICD-10-CM | POA: Diagnosis not present

## 2022-02-09 DIAGNOSIS — H3530 Unspecified macular degeneration: Secondary | ICD-10-CM | POA: Diagnosis not present

## 2022-02-09 DIAGNOSIS — Z85820 Personal history of malignant melanoma of skin: Secondary | ICD-10-CM | POA: Diagnosis not present

## 2022-02-09 DIAGNOSIS — N200 Calculus of kidney: Secondary | ICD-10-CM | POA: Diagnosis not present

## 2022-02-09 DIAGNOSIS — I10 Essential (primary) hypertension: Secondary | ICD-10-CM | POA: Diagnosis not present

## 2022-02-09 DIAGNOSIS — K219 Gastro-esophageal reflux disease without esophagitis: Secondary | ICD-10-CM | POA: Diagnosis not present

## 2022-02-09 DIAGNOSIS — K259 Gastric ulcer, unspecified as acute or chronic, without hemorrhage or perforation: Secondary | ICD-10-CM | POA: Diagnosis not present

## 2022-02-09 DIAGNOSIS — E785 Hyperlipidemia, unspecified: Secondary | ICD-10-CM | POA: Diagnosis not present

## 2022-02-09 DIAGNOSIS — N401 Enlarged prostate with lower urinary tract symptoms: Secondary | ICD-10-CM | POA: Diagnosis not present

## 2022-02-09 DIAGNOSIS — K5730 Diverticulosis of large intestine without perforation or abscess without bleeding: Secondary | ICD-10-CM | POA: Diagnosis not present

## 2022-02-09 DIAGNOSIS — Z85828 Personal history of other malignant neoplasm of skin: Secondary | ICD-10-CM | POA: Diagnosis not present

## 2022-02-09 DIAGNOSIS — K922 Gastrointestinal hemorrhage, unspecified: Secondary | ICD-10-CM | POA: Diagnosis not present

## 2022-02-09 DIAGNOSIS — I2510 Atherosclerotic heart disease of native coronary artery without angina pectoris: Secondary | ICD-10-CM | POA: Diagnosis not present

## 2022-02-09 DIAGNOSIS — Z96612 Presence of left artificial shoulder joint: Secondary | ICD-10-CM | POA: Diagnosis not present

## 2022-02-09 DIAGNOSIS — K2970 Gastritis, unspecified, without bleeding: Secondary | ICD-10-CM | POA: Diagnosis not present

## 2022-02-09 DIAGNOSIS — G4733 Obstructive sleep apnea (adult) (pediatric): Secondary | ICD-10-CM | POA: Diagnosis not present

## 2022-02-09 DIAGNOSIS — K449 Diaphragmatic hernia without obstruction or gangrene: Secondary | ICD-10-CM | POA: Diagnosis not present

## 2022-02-09 DIAGNOSIS — M1990 Unspecified osteoarthritis, unspecified site: Secondary | ICD-10-CM | POA: Diagnosis not present

## 2022-02-10 DIAGNOSIS — N130 Hydronephrosis with ureteropelvic junction obstruction: Secondary | ICD-10-CM | POA: Diagnosis not present

## 2022-02-10 DIAGNOSIS — N401 Enlarged prostate with lower urinary tract symptoms: Secondary | ICD-10-CM | POA: Diagnosis not present

## 2022-02-10 DIAGNOSIS — R3914 Feeling of incomplete bladder emptying: Secondary | ICD-10-CM | POA: Diagnosis not present

## 2022-02-11 ENCOUNTER — Other Ambulatory Visit: Payer: Self-pay

## 2022-02-11 ENCOUNTER — Encounter (INDEPENDENT_AMBULATORY_CARE_PROVIDER_SITE_OTHER): Payer: Medicare Other | Admitting: Ophthalmology

## 2022-02-11 DIAGNOSIS — D3132 Benign neoplasm of left choroid: Secondary | ICD-10-CM | POA: Diagnosis not present

## 2022-02-11 DIAGNOSIS — H353231 Exudative age-related macular degeneration, bilateral, with active choroidal neovascularization: Secondary | ICD-10-CM | POA: Diagnosis not present

## 2022-02-11 DIAGNOSIS — H33301 Unspecified retinal break, right eye: Secondary | ICD-10-CM

## 2022-02-11 DIAGNOSIS — H43813 Vitreous degeneration, bilateral: Secondary | ICD-10-CM

## 2022-02-11 DIAGNOSIS — H35033 Hypertensive retinopathy, bilateral: Secondary | ICD-10-CM

## 2022-02-11 DIAGNOSIS — I10 Essential (primary) hypertension: Secondary | ICD-10-CM

## 2022-02-12 DIAGNOSIS — I493 Ventricular premature depolarization: Secondary | ICD-10-CM | POA: Diagnosis not present

## 2022-02-12 DIAGNOSIS — I10 Essential (primary) hypertension: Secondary | ICD-10-CM | POA: Diagnosis not present

## 2022-02-12 DIAGNOSIS — K922 Gastrointestinal hemorrhage, unspecified: Secondary | ICD-10-CM | POA: Diagnosis not present

## 2022-02-12 DIAGNOSIS — K2970 Gastritis, unspecified, without bleeding: Secondary | ICD-10-CM | POA: Diagnosis not present

## 2022-02-12 DIAGNOSIS — I4891 Unspecified atrial fibrillation: Secondary | ICD-10-CM | POA: Diagnosis not present

## 2022-02-12 DIAGNOSIS — I2510 Atherosclerotic heart disease of native coronary artery without angina pectoris: Secondary | ICD-10-CM | POA: Diagnosis not present

## 2022-02-17 DIAGNOSIS — I10 Essential (primary) hypertension: Secondary | ICD-10-CM | POA: Diagnosis not present

## 2022-02-17 DIAGNOSIS — K2970 Gastritis, unspecified, without bleeding: Secondary | ICD-10-CM | POA: Diagnosis not present

## 2022-02-17 DIAGNOSIS — I4891 Unspecified atrial fibrillation: Secondary | ICD-10-CM | POA: Diagnosis not present

## 2022-02-17 DIAGNOSIS — K922 Gastrointestinal hemorrhage, unspecified: Secondary | ICD-10-CM | POA: Diagnosis not present

## 2022-02-17 DIAGNOSIS — I2510 Atherosclerotic heart disease of native coronary artery without angina pectoris: Secondary | ICD-10-CM | POA: Diagnosis not present

## 2022-02-17 DIAGNOSIS — I493 Ventricular premature depolarization: Secondary | ICD-10-CM | POA: Diagnosis not present

## 2022-02-23 DIAGNOSIS — I480 Paroxysmal atrial fibrillation: Secondary | ICD-10-CM | POA: Diagnosis not present

## 2022-02-23 DIAGNOSIS — I2510 Atherosclerotic heart disease of native coronary artery without angina pectoris: Secondary | ICD-10-CM | POA: Diagnosis not present

## 2022-02-23 DIAGNOSIS — I10 Essential (primary) hypertension: Secondary | ICD-10-CM | POA: Diagnosis not present

## 2022-02-23 DIAGNOSIS — E785 Hyperlipidemia, unspecified: Secondary | ICD-10-CM | POA: Diagnosis not present

## 2022-02-24 ENCOUNTER — Other Ambulatory Visit: Payer: Self-pay

## 2022-02-24 DIAGNOSIS — I4891 Unspecified atrial fibrillation: Secondary | ICD-10-CM | POA: Diagnosis not present

## 2022-02-24 DIAGNOSIS — I493 Ventricular premature depolarization: Secondary | ICD-10-CM | POA: Diagnosis not present

## 2022-02-24 DIAGNOSIS — K2970 Gastritis, unspecified, without bleeding: Secondary | ICD-10-CM | POA: Diagnosis not present

## 2022-02-24 DIAGNOSIS — I2510 Atherosclerotic heart disease of native coronary artery without angina pectoris: Secondary | ICD-10-CM | POA: Diagnosis not present

## 2022-02-24 DIAGNOSIS — K922 Gastrointestinal hemorrhage, unspecified: Secondary | ICD-10-CM | POA: Diagnosis not present

## 2022-02-24 DIAGNOSIS — I10 Essential (primary) hypertension: Secondary | ICD-10-CM | POA: Diagnosis not present

## 2022-02-24 MED ORDER — APIXABAN 5 MG PO TABS: 5.0000 mg | ORAL_TABLET | Freq: Two times a day (BID) | ORAL | 3 refills | Status: DC

## 2022-03-05 ENCOUNTER — Telehealth: Payer: Self-pay | Admitting: Family Medicine

## 2022-03-05 MED ORDER — LOSARTAN POTASSIUM 25 MG PO TABS: 12.5000 mg | ORAL_TABLET | Freq: Every day | ORAL | 0 refills | Status: DC

## 2022-03-05 NOTE — Telephone Encounter (Signed)
Out of meds -  ? ? ?Encourage patient to contact the pharmacy for refills or they can request refills through [facility] ? ?LAST APPOINTMENT DATE:  Please schedule appointment if longer than 1 year ? ?NEXT APPOINTMENT DATE: ? ?MEDICATION: losartan (COZAAR) 25 MG tablet ? ?Is the patient out of medication? yes ? ?PHARMACY:   ? ?Let patient know to contact pharmacy at the end of the day to make sure medication is ready. ? ?Please notify patient to allow 48-72 hours to process  ?

## 2022-03-05 NOTE — Telephone Encounter (Signed)
Refill sent to pharmacy.   

## 2022-03-09 DIAGNOSIS — Z20822 Contact with and (suspected) exposure to covid-19: Secondary | ICD-10-CM | POA: Diagnosis not present

## 2022-03-15 NOTE — Progress Notes (Signed)
?Cardiology Office Note:   ? ?Date:  03/24/2022  ? ?ID:  Joseph Simpson, DOB May 22, 1935, MRN [id number] ? ?PCP:  Marin Olp, MD ?  ?[facility] Providers ?Cardiologist:  Elda Dunkerson Martinique, MD    ? ?Referring MD: Marin Olp, MD  ? ?Chief Complaint  ?Patient presents with  ? Follow-up  ? Atrial Fibrillation  ? ? ?History of Present Illness:   ? ?Joseph Simpson is a 87 y.o. male with a hx of hypertension, PVCs, obstructive sleep apnea not on CPAP, presyncope, and nonobstructive CAD.  He had a abnormal stress test in 2013, follow-up cardiac catheterization demonstrated nonobstructive CAD.    He was seen by his PCP Dr. Yong Channel on 06/27/2021, heart rate at the time was 40 bpm.  His metoprolol was cut back. He was placed on amlodipine but developed dizziness and this was stopped.  ? ?He was hospitalized in February 2023 with coffee ground emesis and UTI. He was treated with IV antibiotics. EGD showed esophagitis and gastritis with some gastric ulcerations. He was placed on Carafate in addition to Protonix. He was found to be in Afib with controlled rate. Echo was unremarkable. He was not started on anticoagulation due to GI bleed. Hgb was stable at 13.1. Later seen by urology and given another course of antibiotics. Event monitor was done showing paroxysmal Afib with burden 6%. Rate generally well controlled. He was started on anticoagulation with Eliquis as an outpatient.  ? ?On follow up today he is doing very well. No chest pain or dyspnea. Has a little bit of flutter at times. No edema. No bleeding on Eliquis ? ? ?Past Medical History:  ?Diagnosis Date  ? Arthritis   ? B12 deficiency   ? Bladder calculi   ? CAD (coronary artery disease)   ? cardiologist--- dr Martinique;   abnormal nuclear stress test 11/ 2013;  s/p cardiac cath 10-21-2012 minimal nonobstructiive CAD w/ normal LVF  ? Dyslipidemia   ? Full dentures   ? Gastroparesis   ? followed by [name] PA ( [facility] in [place]) s/p gastrectomy for ulcers  in 1970s  ? GERD (gastroesophageal reflux disease)   ? Heart murmur   ? Hiatal hernia   ? History of adenomatous polyp of colon   ? History of kidney stones   ? History of melanoma 2008  ? s/p left forearn wide local excision 06/ 2008  ? History of syncope 2010  ? cardiologist --- dr Martinique;  (11-25-2021 per pt no syncope or near syncope since)  ? Hypertension   ? Macular degeneration of both eyes   ? Nocturia associated with benign prostatic hyperplasia   ? OSA (obstructive sleep apnea)   ? no cpap  ? Polyneuropathy   ? PVC's (premature ventricular contractions) 2010  ? 06/ 2010 per event monitor SR w/ occasional PVCs and 4 beat SVT (in epic)  ? Wears glasses   ? ? ?Past Surgical History:  ?Procedure Laterality Date  ? APPENDECTOMY    ? child  ? CARDIAC CATHETERIZATION  10/2012  ? minimal nonobstructive CAD with normal LV function  ? CATARACT EXTRACTION W/ INTRAOCULAR LENS IMPLANT Bilateral   ? 2009 approx  ? CYSTOSCOPY WITH LITHOLAPAXY N/A 07/13/2016  ? Procedure: CYSTOSCOPY WITH LITHOLAPAXY;  Surgeon: Franchot Gallo, MD;  Location: [facility];  Service: Urology;  Laterality: N/A;  ? CYSTOSCOPY WITH LITHOLAPAXY N/A 12/01/2021  ? Procedure: CYSTOSCOPY WITH LITHOLAPAXY;  Surgeon: Franchot Gallo, MD;  Location: [facility];  Service: Urology;  Laterality: N/A;  ? CYSTOSCOPY/RETROGRADE/URETEROSCOPY/STONE EXTRACTION WITH BASKET  02/24/2012  ? Procedure: CYSTOSCOPY/RETROGRADE/URETEROSCOPY/STONE EXTRACTION WITH BASKET;  Surgeon: Franchot Gallo, MD;  Location: [facility];  Service: Urology;  Laterality: Bilateral;  1 HOUR  ? ESOPHAGOGASTRODUODENOSCOPY N/A 01/06/2022  ? Procedure: ESOPHAGOGASTRODUODENOSCOPY (EGD);  Surgeon: Arta Silence, MD;  Location: [facility];  Service: Gastroenterology;  Laterality: N/A;  ? EXTRACORPOREAL SHOCK WAVE LITHOTRIPSY  11/16/2011  ? [facility]   ? GASTRECTOMY    ? 1970s  for ulcers  ? HEMORRHOID SURGERY    ? yrs ago  ? HOLMIUM LASER APPLICATION N/A 1/61/0960   ? Procedure: HOLMIUM LASER APPLICATION;  Surgeon: Franchot Gallo, MD;  Location: [facility];  Service: Urology;  Laterality: N/A;  ? INGUINAL HERNIA REPAIR Left 07/13/2016  ? Procedure: REPAIR LEFT INGUINAL HERNIA;  Surgeon: Armandina Gemma, MD;  Location: [facility];  Service: General;  Laterality: Left;  ? INSERTION OF MESH Left 07/13/2016  ? Procedure: INSERTION OF MESH;  Surgeon: Armandina Gemma, MD;  Location: [facility];  Service: General;  Laterality: Left;  ? MELANOMA EXCISION Left 05/05/2007  ? '@[facility]  ;   LEFT FOREARM  ? TONSILLECTOMY    ? child  ? TOTAL HIP ARTHROPLASTY Left 02/11/2021  ? Procedure: TOTAL HIP ARTHROPLASTY;  Surgeon: Marchia Bond, MD;  Location: [facility];  Service: Orthopedics;  Laterality: Left;  ? TOTAL SHOULDER ARTHROPLASTY Right 12/29/2017  ? Procedure: TOTAL SHOULDER ARTHROPLASTY;  Surgeon: Hiram Gash, MD;  Location: [facility];  Service: Orthopedics;  Laterality: Right;  ? ? ?Current Medications: ?Current Meds  ?Medication Sig  ? acetaminophen (TYLENOL) 500 MG tablet Take 500-1,000 mg by mouth 2 (two) times daily as needed for moderate pain or headache.  ? apixaban (ELIQUIS) 5 MG TABS tablet Take 1 tablet (5 mg total) by mouth 2 (two) times daily.  ? atorvastatin (LIPITOR) 20 MG tablet TAKE ONE TABLET BY MOUTH THREE TIMES A WEEK  ? bevacizumab (AVASTIN) 2.5 mg/0.1 mL SOLN 2.5 mg by Intravitreal route. Injection to eyes every 5 weeks - use with vigamox  ? Carboxymethylcellulose Sodium 1 % GEL Place 1 drop into both eyes at bedtime.  ? Cyanocobalamin (B-12 COMPLIANCE INJECTION IJ) Inject 1 Dose as directed every 30 (thirty) days.  ? losartan (COZAAR) 25 MG tablet Take 0.5 tablets (12.5 mg total) by mouth daily.  ? metoCLOPramide (REGLAN) 5 MG tablet Take 5 mg by mouth 2 (two) times daily.  ? metoprolol tartrate (LOPRESSOR) 25 MG tablet Take 12.5 mg by mouth 2 (two) times daily.  ? neomycin-polymyxin b-dexamethasone (MAXITROL) 3.5-10000-0.1 OINT   ? pantoprazole (PROTONIX) 40 MG tablet  Take 1 tablet by mouth 2 (two) times daily before a meal.  ? polyethylene glycol (MIRALAX / GLYCOLAX) 17 g packet Take 17 g by mouth daily as needed for moderate constipation.  ? Propylene Glycol (SYSTANE BALANCE OP) Place 1 drop into both eyes daily.  ? tamsulosin (FLOMAX) 0.4 MG CAPS capsule Take 0.4 mg by mouth daily.  ? VIGAMOX 0.5 % ophthalmic solution Place 1 drop into both eyes as directed. Take every 5 weeks as directed - use with avastin, instill 1 drop into both eyes 4 times daily for 2 days after eye injections  ? [DISCONTINUED] sucralfate (CARAFATE) 1 g tablet Take 1 tablet (1 g total) by mouth 4 (four) times daily -  with meals and at bedtime.  ? [DISCONTINUED] triamcinolone cream (KENALOG) 0.1 % Apply 1 application topically 2 (two) times daily.  ?  ? ?  Allergies:   Aspirin, Percocet [oxycodone-acetaminophen], and Vitamins [apatate]  ? ?Social History  ? ?Socioeconomic History  ? Marital status: Widowed  ?  Spouse name: Not on file  ? Number of children: 1  ? Years of education: Not on file  ? Highest education level: Not on file  ?Occupational History  ? Occupation: retired  ?Tobacco Use  ? Smoking status: Former  ?  Packs/day: 0.10  ?  Years: 4.00  ?  Pack years: 0.40  ?  Types: Cigarettes  ?  Quit date: 09/03/1956  ?  Years since quitting: 65.5  ? Smokeless tobacco: Never  ?Vaping Use  ? Vaping Use: Never used  ?Substance and Sexual Activity  ? Alcohol use: No  ?  Alcohol/week: 0.0 standard drinks  ? Drug use: Never  ? Sexual activity: Yes  ?Other Topics Concern  ? Not on file  ?Social History Narrative  ? Widowed (lost wife 2015 from lung cancer never smoker), 1 son, 2 grandkids, 1 greatgrandkid (born on Christmas)  ?   ? 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   ?   ? Hobbies: ride motorcycle (slingshot 3 year), rabbit and dear hunt  ? ?Social Determinants of Health  ? ?Financial Resource Strain: Low Risk   ? Difficulty of Paying Living Expenses: Not hard at all  ?Food Insecurity: No  Food Insecurity  ? Worried About Charity fundraiser in the Last Year: Never true  ? Ran Out of Food in the Last Year: Never true  ?Transportation Needs: No Transportation Needs  ? Lack of Transporta

## 2022-03-16 ENCOUNTER — Encounter (INDEPENDENT_AMBULATORY_CARE_PROVIDER_SITE_OTHER): Payer: Medicare Other | Admitting: Ophthalmology

## 2022-03-16 DIAGNOSIS — H43813 Vitreous degeneration, bilateral: Secondary | ICD-10-CM

## 2022-03-16 DIAGNOSIS — H353231 Exudative age-related macular degeneration, bilateral, with active choroidal neovascularization: Secondary | ICD-10-CM | POA: Diagnosis not present

## 2022-03-16 DIAGNOSIS — D3132 Benign neoplasm of left choroid: Secondary | ICD-10-CM | POA: Diagnosis not present

## 2022-03-16 DIAGNOSIS — H33301 Unspecified retinal break, right eye: Secondary | ICD-10-CM | POA: Diagnosis not present

## 2022-03-16 DIAGNOSIS — I10 Essential (primary) hypertension: Secondary | ICD-10-CM

## 2022-03-16 DIAGNOSIS — H35033 Hypertensive retinopathy, bilateral: Secondary | ICD-10-CM

## 2022-03-16 NOTE — Progress Notes (Signed)
? ? ?Chronic Care Management ?Pharmacy Note ? ?Summary: FU visit.  Reports BP has been fairly controlled.  Does report some systolic readings in the 518A.  Have asked him to monitor and record twice per day for two weeks and we will call and check in.  No other concerns at this time. ? ? ? ?03/18/2022 ?Name:  Joseph Simpson MRN:  [id number] DOB:  16-Jan-1935 ? ?Subjective: ?Joseph Simpson is an 87 y.o. year old male who is a primary patient of Hunter, Brayton Mars, MD.  The CCM team was consulted for assistance with disease management and care coordination needs.   ? ?Engaged with patient by telephone for follow up visit in response to provider referral for pharmacy case management and/or care coordination services.  ? ?Consent to Services:  ?The patient was given the following information about Chronic Care Management services today, agreed to services, and gave verbal consent: 1. CCM service includes personalized support from designated clinical staff supervised by the primary care provider, including individualized plan of care and coordination with other care providers 2. 24/7 contact phone numbers for assistance for urgent and routine care needs. 3. Service will only be billed when office clinical staff spend 20 minutes or more in a month to coordinate care. 4. Only one practitioner may furnish and bill the service in a calendar month. 5.The patient may stop CCM services at any time (effective at the end of the month) by phone call to the office staff. 6. The patient will be responsible for cost sharing (co-pay) of up to 20% of the service fee (after annual deductible is met). Patient agreed to services and consent obtained. ? ?Patient Care Team: ?Marin Olp, MD as PCP - General (Family Medicine) ?Martinique, Peter M, MD as PCP - Cardiology (Cardiology) ?Martinique, Peter M, MD as Consulting Physician (Cardiology) ?Almedia Balls, MD as Referring Physician (Orthopedic Surgery) ?Breck Coons, MD as Consulting Physician  (Internal Medicine) ?Jari Pigg, MD as Consulting Physician (Dermatology) ?Franchot Gallo, MD as Consulting Physician (Urology) ?Hayden Pedro, MD as Consulting Physician (Ophthalmology) ?Edythe Clarity, [facility] (Pharmacist) ? ?Recent office visits:  ?07/30/2021 OV (PCP) Marin Olp, MD; reduce metoprolol to 12.5 mg twice daily, stop Amlodipine as BP was running too low. ?  ?06/27/2021 OV (PCP) Marin Olp, MD; start Amlodipine 1.25 mg at bedtime and follow up in 1 month. ?  ?Recent consult visits:  ?07/28/2021 [facility] (cardiology) Almyra Deforest, PA-C; follow up CAD, no medication changes indicated. ?  ?07/04/2021 OV (sports medicine) [place], DO; right foot pain, no medication changes indicated. ?  ?Hospital visits:  ?None in previous 6 months ? ?Objective: ? ?Lab Results  ?Component Value Date  ? CREATININE 0.65 01/14/2022  ? CREATININE 0.70 01/09/2022  ? CREATININE 0.66 01/08/2022  ? ? ?Lab Results  ?Component Value Date  ? HGBA1C 5.8 01/27/2022  ? ?Last diabetic Eye exam:  ?Lab Results  ?Component Value Date/Time  ? HMDIABEYEEXA No Retinopathy 01/16/2020 12:00 AM  ?  ?Last diabetic Foot exam: No results found for: HMDIABFOOTEX  ? ?   ?Component Value Date/Time  ? CHOL 113 01/27/2022 1114  ? TRIG 132.0 01/27/2022 1114  ? HDL 37.30 (L) 01/27/2022 1114  ? CHOLHDL 3 01/27/2022 1114  ? VLDL 26.4 01/27/2022 1114  ? LDLCALC 49 01/27/2022 1114  ? LDLDIRECT 83 06/19/2020 1033  ? ? ? ?  Latest Ref Rng & Units 01/14/2022  ?  8:41 AM 01/07/2022  ?  4:07  AM 01/06/2022  ?  3:54 AM  ?Hepatic Function  ?Total Protein 6.0 - 8.3 g/dL 7.0   6.1     ?Albumin 3.5 - 5.2 g/dL 3.8   3.1   3.8    ?AST 0 - 37 U/L 19   19     ?ALT 0 - 53 U/L 18   17     ?Alk Phosphatase 39 - 117 U/L 63   51     ?Total Bilirubin 0.2 - 1.2 mg/dL 0.9   1.2     ? ? ?Lab Results  ?Component Value Date/Time  ? TSH 1.731 01/05/2022 03:53 AM  ? TSH 4.27 06/27/2021 10:35 AM  ? TSH 2.46 08/12/2015 11:27 AM  ? ? ? ?  Latest Ref Rng & Units 01/14/2022   ?  8:41 AM 01/09/2022  ?  3:55 AM 01/08/2022  ?  4:07 AM  ?CBC  ?WBC 4.0 - 10.5 K/uL 6.0   7.7   8.3    ?Hemoglobin 13.0 - 17.0 g/dL 13.1   13.1   13.6    ?Hematocrit 39.0 - 52.0 % 38.4   37.6   39.0    ?Platelets 150.0 - 400.0 K/uL 185.0   131   150    ? ? ?Lab Results  ?Component Value Date/Time  ? VD25OH 34.92 08/20/2021 10:42 AM  ? ? ?Clinical ASCVD:  ?The ASCVD Risk score (Arnett DK, et al., 2019) failed to calculate for the following reasons: ?  The 2019 ASCVD risk score is only valid for ages 31 to 70   ? ?Social History  ? ?Tobacco Use  ?Smoking Status Former  ? Packs/day: 0.10  ? Years: 4.00  ? Pack years: 0.40  ? Types: Cigarettes  ? Quit date: 09/03/1956  ? Years since quitting: 65.5  ?Smokeless Tobacco Never  ? ?BP Readings from Last 3 Encounters:  ?01/27/22 120/60  ?01/26/22 (!) 128/52  ?01/14/22 120/60  ? ?Pulse Readings from Last 3 Encounters:  ?01/27/22 (!) 52  ?01/26/22 (!) 50  ?01/14/22 75  ? ?Wt Readings from Last 3 Encounters:  ?01/27/22 170 lb (77.1 kg)  ?01/26/22 168 lb 9.6 oz (76.5 kg)  ?01/14/22 166 lb (75.3 kg)  ? ? ?Assessment: Review of patient past medical history, allergies, medications, health status, including review of consultants reports, laboratory and other test data, was performed as part of comprehensive evaluation and provision of chronic care management services.  ? ?SDOH:  (Social Determinants of Health) assessments and interventions performed: Yes ? ?CCM Care Plan ? ?Allergies  ?Allergen Reactions  ? Aspirin Other (See Comments)  ?  UPSET STOMACH  ? Percocet [Oxycodone-Acetaminophen] Nausea And Vomiting  ? Vitamins [Apatate] Other (See Comments)  ?  Bothers stomach  ? ? ?Medications Reviewed Today   ? ? Reviewed by Edythe Clarity, RPH (Pharmacist) on 03/18/22 at [phone]  Med List Status: <None>  ? ?Medication Order Taking? Sig Documenting Provider Last Dose Status Informant  ?acetaminophen (TYLENOL) 500 MG tablet [id number] Yes Take 500-1,000 mg by mouth 2 (two) times daily as  needed for moderate pain or headache. [provider] Taking Active Self  ?apixaban (ELIQUIS) 5 MG TABS tablet [id number] Yes Take 1 tablet (5 mg total) by mouth 2 (two) times daily. Martinique, Peter M, MD Taking Active   ?atorvastatin (LIPITOR) 20 MG tablet [id number] Yes TAKE ONE TABLET BY MOUTH THREE TIMES A WEEK Marin Olp, MD Taking Active Self  ?bevacizumab (AVASTIN) 2.5 mg/0.1 mL SOLN [id number] Yes 2.5 mg  by Intravitreal route. Injection to eyes every 5 weeks - use with vigamox [provider] Taking Active Self  ?         ?Med Note Willodean Rosenthal Apr 04, 2020  9:29 AM)    ?Carboxymeth-Glycerin-Polysorb (REFRESH OPTIVE ADVANCED OP) [id number] Yes Place 1 drop into both eyes daily. [provider] Taking Active Self  ?Carboxymethylcellulose Sodium 1 % GEL [id number] Yes Place 1 drop into both eyes at bedtime. [provider] Taking Active Self  ?Cyanocobalamin (B-12 COMPLIANCE INJECTION IJ) [id number] Yes Inject 1 Dose as directed every 30 (thirty) days. [provider] Taking Active Self  ?         ?Med Note Marvell Fuller   EQF Jan 03, 2022  3:16 PM)    ?losartan (COZAAR) 25 MG tablet [id number] Yes Take 0.5 tablets (12.5 mg total) by mouth daily. Marin Olp, MD Taking Active   ?metoCLOPramide (REGLAN) 5 MG tablet [id number] Yes Take 5 mg by mouth 2 (two) times daily. [provider] Taking Active Self  ?metoprolol tartrate (LOPRESSOR) 25 MG tablet [id number] Yes Take 1 tablet (25 mg total) by mouth 2 (two) times daily. Aline August, MD Taking Active   ?neomycin-polymyxin b-dexamethasone (MAXITROL) 3.5-10000-0.1 OINT [id number] Yes  [provider] Taking Active   ?ondansetron (ZOFRAN-ODT) 4 MG disintegrating tablet [id number] Yes Take 1 tablet (4 mg total) by mouth every 8 (eight) hours as needed for nausea or vomiting. Loni Beckwith, PA-C Taking Active Self  ?pantoprazole (PROTONIX) 40 MG tablet [id number] Yes Take 1  tablet by mouth 2 (two) times daily before a meal. [provider] Taking Active   ?polyethylene glycol (MIRALAX / GLYCOLAX) 17 g packet [id number] Yes Take 17 g by mouth daily as needed for mo

## 2022-03-17 ENCOUNTER — Ambulatory Visit (INDEPENDENT_AMBULATORY_CARE_PROVIDER_SITE_OTHER): Payer: Medicare Other | Admitting: Pharmacist

## 2022-03-17 DIAGNOSIS — E785 Hyperlipidemia, unspecified: Secondary | ICD-10-CM

## 2022-03-17 DIAGNOSIS — I10 Essential (primary) hypertension: Secondary | ICD-10-CM

## 2022-03-18 ENCOUNTER — Ambulatory Visit (INDEPENDENT_AMBULATORY_CARE_PROVIDER_SITE_OTHER): Payer: Medicare Other | Admitting: Family Medicine

## 2022-03-18 DIAGNOSIS — E538 Deficiency of other specified B group vitamins: Secondary | ICD-10-CM

## 2022-03-18 MED ORDER — CYANOCOBALAMIN 1000 MCG/ML IJ SOLN
1000.0000 ug | Freq: Once | INTRAMUSCULAR | Status: AC
  Administered 2022-03-18: 1000 ug via INTRAMUSCULAR

## 2022-03-18 NOTE — Progress Notes (Signed)
Pt seen for B-12 injection in left deltoid, pt tolerated well, no other concerns ?

## 2022-03-18 NOTE — Patient Instructions (Addendum)
Visit Information ? ? Goals Addressed   ? ?  ?  ?  ?  ? This Visit's Progress  ?  Track and Manage My Blood Pressure-Hypertension   On track  ?  Timeframe:  Long-Range Goal ?Priority:  High ?Start Date:     09/02/21                        ?Expected End Date:  04/18/ 23                   ? ?Follow Up Date 12/03/21  ?  ?- check blood pressure 3 times per week ?- choose a place to take my blood pressure (home, clinic or office, retail store) ?- write blood pressure results in a log or diary  ?  ?Why is this important?   ?You won't feel high blood pressure, but it can still hurt your blood vessels.  ?High blood pressure can cause heart or kidney problems. It can also cause a stroke.  ?Making lifestyle changes like losing a little weight or eating less salt will help.  ?Checking your blood pressure at home and at different times of the day can help to control blood pressure.  ?If the doctor prescribes medicine remember to take it the way the doctor ordered.  ?Call the office if you cannot afford the medicine or if there are questions about it.   ?  ?Notes:  ?  ? ?  ? ?Patient Care Plan: [place]  ?  ? ?Problem Identified: HLD, HTN, Hyperglycemia, CAD, PAC, PVC, OSA, OA, GERD, B12 deficiency   ?Priority: High  ?  ? ?Goal: Disease Management   ?Start Date: 02/19/2021  ?Expected End Date: 02/19/2022  ?Recent Progress: On track  ?Priority: High  ?Note:   ?Current Barriers:  ?Slightly elevated BP ? ?Pharmacist Clinical Goal(s):  ?Patient will contact provider office for questions/concerns as evidenced notation of same in electronic health record through collaboration with PharmD and provider.  ? ?Interventions: ?1:1 collaboration with Marin Olp, MD regarding development and update of comprehensive plan of care as evidenced by provider attestation and co-signature ?Inter-disciplinary care team collaboration (see longitudinal plan of care) ?Comprehensive medication review performed; medication list updated in  electronic medical record ?No medication changes, monitoring BP ? ?Hypertension (BP goal <130/80) ?-Controlled ?-OSA - previously used cpap, not interested in using  ?-Current treatment: ?Losartan 12.'5mg'$  Appropriate, Effective, Safe, Accessible ?-Medications previously tried: lisinopril, amlodipine    ?-Current home readings: 117/61 most recently, ~120/60 on avg ?-Denies hypotensive/hypertensive symptoms ?-Educated on Daily salt intake goal < 2300 mg; ?Importance of home blood pressure monitoring; ?-Counseled to monitor BP at home once every 1-2 weeks, document, and provide log at future appointments ?-Recommended to continue current medication  ? ?Update 09/02/21 ?Home readings: 140-150/70s ?Counseled on BP goals.  Reviewed last office BP which was also elevated, however he was in pain.  Previously had to stop amlodipine due to dizziness.  Has also tried HCTZ in the past.  Have asked him to monitor and report consistent readings < 973 systolic.  He denies any dizziness at this time. ?Continue meds for now - if BP remains elevated may need to consider low dose HCTZ. ? ?Update 03/17/22 ?130/78 this morning. ?Normally < 130/80. ?Losartan as been reduced all the way down to 12.'5mg'$  daily since the hospital stay.  BP has been well controlled per patient.  Denies dizziness or HA. ?Continue current dosages for  now.  He is going to monitor BP at home.  He is to call if BP is consistently > 130/80.  Has mentioned that some of his morning readings were elvated in the 620B systolic. ?Will check in two weeks to assess. ? ?PAC, PVCs (Goal: minimize side effects) ?-Controlled ?-HR has been in 50-60s while at rest  ?-Current treatment  ?Metoprolol tartrate 25 mg tablet twice daily (Dr Martinique) ?-Denies side effects  ?-Recommended to continue current medication ? ?Hyperlipidemia: (LDL goal < 70) ?-Controlled ?-2013- s/p cardiac cath with minimal nonobstructive CAD. No ASA due to stomach history. ?-Current treatment: ?Atorvastatin 20  mg three times WEEKLY (2/3 90 DS) Appropriate, Effective, Safe, Accessible ?-Medications previously tried: atorvastatin 10 mg daily, atorvastatin 40 mg once WEEKLY  ?-Current dietary patterns: no added salt, minimal red meat, eating a lot ?-Current exercise habits: left hip replacement 02/11/2021. Has been able to walk around the house. Doesn't really due ?-Educated on Cholesterol goals;  ?Importance of limiting foods high in cholesterol; ?-Recommended to continue current medication ? ?Update 09/02/21 ?Continues to be adherent to 3x per week dose.  Most recent LDL in Feb was controlled. Continue current medication. ?Recheck lipids at next CPE. ? ?Update 03/18/22 ?NO changes needed at this time.  LDL was well controlled while patient was in the hospital.  Denies any adverse effects at this time.   ?Continue current dose. ?Continue routine lipid screenings. ? ?GERD, Gastroparesis (Goal: minimize symptoms) ?Reports Dr Erlene Quan is retiring and will need to get a new GI doctor, preference is to stay with [facility] at this time. Will let us know if wanting to see a [facility] provider.  ? 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 ?OA of the left hip (Goal: ensure safe medication use) ?-Controlled  ?-s/p hip replacement 02/11/2021. Doing HH PT and will be doing PT in office 02/24/2021. Was discharged on: ?Senokot-S 8.6-50 mg once daily  ?Counseled on side effects, pain medication use, has not used tramadol in about three days. Tylenol is go-to OTC pain medication, not otherwise using NSAIDs ?-Recommended to continue current medication ? ? ?Patient Goals/Self-Care Activities ?Patient will:  ?-  take medications as prescribed ?engage in dietary modifications by reducing intake of foods high in carbohydrates/fat/salt ?exercise as tolerated  ? ?Follow Up Plan: 1 month BP call, [place] telephone visit in ~6 months ? ?Medication Assistance: None required.  Patient affirms current coverage meets needs. ? ?Patient's preferred pharmacy is: ? ?[facility] # [address] [place], [address] ?[place] ?[place] [address] ?Phone: [phone] Fax: [phone] ? ?Follow Up:  Patient agrees to Care Plan and Follow-up. ?  ? ?  ?  ? ?The patient verbalized understanding of instructions, educational materials, and care plan provided today and declined offer to receive copy of patient instructions, educational materials, and care plan.  ?Telephone follow up appointment with pharmacy team member scheduled for: 6 months ? ?Edythe Clarity, [facility]  ?Beverly Milch, PharmD ?Clinical Pharmacist  ?[place] [facility] ?[phone] ? ?

## 2022-03-24 ENCOUNTER — Encounter: Payer: Self-pay | Admitting: Cardiology

## 2022-03-24 ENCOUNTER — Ambulatory Visit (INDEPENDENT_AMBULATORY_CARE_PROVIDER_SITE_OTHER): Payer: Medicare Other | Admitting: Cardiology

## 2022-03-24 VITALS — BP 120/70 | HR 76 | Ht 72.0 in | Wt 175.0 lb

## 2022-03-24 DIAGNOSIS — D2370 Other benign neoplasm of skin of unspecified lower limb, including hip: Secondary | ICD-10-CM | POA: Insufficient documentation

## 2022-03-24 DIAGNOSIS — Z87898 Personal history of other specified conditions: Secondary | ICD-10-CM | POA: Insufficient documentation

## 2022-03-24 DIAGNOSIS — L814 Other melanin hyperpigmentation: Secondary | ICD-10-CM | POA: Insufficient documentation

## 2022-03-24 DIAGNOSIS — L821 Other seborrheic keratosis: Secondary | ICD-10-CM | POA: Insufficient documentation

## 2022-03-24 DIAGNOSIS — E785 Hyperlipidemia, unspecified: Secondary | ICD-10-CM

## 2022-03-24 DIAGNOSIS — I2510 Atherosclerotic heart disease of native coronary artery without angina pectoris: Secondary | ICD-10-CM

## 2022-03-24 DIAGNOSIS — I10 Essential (primary) hypertension: Secondary | ICD-10-CM

## 2022-03-24 DIAGNOSIS — D2230 Melanocytic nevi of unspecified part of face: Secondary | ICD-10-CM | POA: Insufficient documentation

## 2022-03-24 DIAGNOSIS — D225 Melanocytic nevi of trunk: Secondary | ICD-10-CM | POA: Insufficient documentation

## 2022-03-24 DIAGNOSIS — I480 Paroxysmal atrial fibrillation: Secondary | ICD-10-CM | POA: Diagnosis not present

## 2022-03-31 DIAGNOSIS — L814 Other melanin hyperpigmentation: Secondary | ICD-10-CM | POA: Diagnosis not present

## 2022-03-31 DIAGNOSIS — Z85828 Personal history of other malignant neoplasm of skin: Secondary | ICD-10-CM | POA: Diagnosis not present

## 2022-03-31 DIAGNOSIS — L821 Other seborrheic keratosis: Secondary | ICD-10-CM | POA: Diagnosis not present

## 2022-03-31 DIAGNOSIS — L570 Actinic keratosis: Secondary | ICD-10-CM | POA: Diagnosis not present

## 2022-03-31 DIAGNOSIS — L910 Hypertrophic scar: Secondary | ICD-10-CM | POA: Diagnosis not present

## 2022-03-31 DIAGNOSIS — D485 Neoplasm of uncertain behavior of skin: Secondary | ICD-10-CM | POA: Diagnosis not present

## 2022-03-31 DIAGNOSIS — D225 Melanocytic nevi of trunk: Secondary | ICD-10-CM | POA: Diagnosis not present

## 2022-03-31 DIAGNOSIS — D2230 Melanocytic nevi of unspecified part of face: Secondary | ICD-10-CM | POA: Diagnosis not present

## 2022-03-31 DIAGNOSIS — Z87898 Personal history of other specified conditions: Secondary | ICD-10-CM | POA: Diagnosis not present

## 2022-03-31 DIAGNOSIS — C44519 Basal cell carcinoma of skin of other part of trunk: Secondary | ICD-10-CM | POA: Diagnosis not present

## 2022-03-31 DIAGNOSIS — L578 Other skin changes due to chronic exposure to nonionizing radiation: Secondary | ICD-10-CM | POA: Diagnosis not present

## 2022-03-31 DIAGNOSIS — D2271 Melanocytic nevi of right lower limb, including hip: Secondary | ICD-10-CM | POA: Diagnosis not present

## 2022-04-15 ENCOUNTER — Encounter (INDEPENDENT_AMBULATORY_CARE_PROVIDER_SITE_OTHER): Payer: Medicare Other | Admitting: Ophthalmology

## 2022-04-15 DIAGNOSIS — H353231 Exudative age-related macular degeneration, bilateral, with active choroidal neovascularization: Secondary | ICD-10-CM

## 2022-04-15 DIAGNOSIS — E785 Hyperlipidemia, unspecified: Secondary | ICD-10-CM

## 2022-04-15 DIAGNOSIS — M1612 Unilateral primary osteoarthritis, left hip: Secondary | ICD-10-CM

## 2022-04-15 DIAGNOSIS — H35033 Hypertensive retinopathy, bilateral: Secondary | ICD-10-CM | POA: Diagnosis not present

## 2022-04-15 DIAGNOSIS — I10 Essential (primary) hypertension: Secondary | ICD-10-CM | POA: Diagnosis not present

## 2022-04-15 DIAGNOSIS — I493 Ventricular premature depolarization: Secondary | ICD-10-CM

## 2022-04-15 DIAGNOSIS — Z87891 Personal history of nicotine dependence: Secondary | ICD-10-CM

## 2022-04-15 DIAGNOSIS — H33301 Unspecified retinal break, right eye: Secondary | ICD-10-CM

## 2022-04-15 DIAGNOSIS — D3132 Benign neoplasm of left choroid: Secondary | ICD-10-CM

## 2022-04-15 DIAGNOSIS — I491 Atrial premature depolarization: Secondary | ICD-10-CM

## 2022-04-15 DIAGNOSIS — H43813 Vitreous degeneration, bilateral: Secondary | ICD-10-CM | POA: Diagnosis not present

## 2022-04-15 DIAGNOSIS — K3184 Gastroparesis: Secondary | ICD-10-CM

## 2022-04-21 ENCOUNTER — Ambulatory Visit (INDEPENDENT_AMBULATORY_CARE_PROVIDER_SITE_OTHER): Payer: Medicare Other

## 2022-04-21 DIAGNOSIS — E538 Deficiency of other specified B group vitamins: Secondary | ICD-10-CM | POA: Diagnosis not present

## 2022-04-21 MED ORDER — CYANOCOBALAMIN 1000 MCG/ML IJ SOLN
1000.0000 ug | Freq: Once | INTRAMUSCULAR | Status: AC
  Administered 2022-04-21: 1000 ug via INTRAMUSCULAR

## 2022-04-21 NOTE — Progress Notes (Signed)
Pt here for B12 injection for Dr. Yong Channel. Injection given in Right deltoid. Pt tolerated well.

## 2022-05-01 ENCOUNTER — Other Ambulatory Visit: Payer: Self-pay | Admitting: Family Medicine

## 2022-05-13 ENCOUNTER — Encounter (INDEPENDENT_AMBULATORY_CARE_PROVIDER_SITE_OTHER): Payer: Medicare Other | Admitting: Ophthalmology

## 2022-05-13 DIAGNOSIS — H35033 Hypertensive retinopathy, bilateral: Secondary | ICD-10-CM | POA: Diagnosis not present

## 2022-05-13 DIAGNOSIS — H353231 Exudative age-related macular degeneration, bilateral, with active choroidal neovascularization: Secondary | ICD-10-CM

## 2022-05-13 DIAGNOSIS — I10 Essential (primary) hypertension: Secondary | ICD-10-CM

## 2022-05-13 DIAGNOSIS — H43813 Vitreous degeneration, bilateral: Secondary | ICD-10-CM

## 2022-05-13 DIAGNOSIS — H33301 Unspecified retinal break, right eye: Secondary | ICD-10-CM

## 2022-05-13 DIAGNOSIS — D3132 Benign neoplasm of left choroid: Secondary | ICD-10-CM | POA: Diagnosis not present

## 2022-05-18 ENCOUNTER — Ambulatory Visit: Payer: Medicare Other | Admitting: Family Medicine

## 2022-05-20 ENCOUNTER — Ambulatory Visit: Payer: Medicare Other

## 2022-05-21 ENCOUNTER — Encounter: Payer: Self-pay | Admitting: Family Medicine

## 2022-05-21 ENCOUNTER — Ambulatory Visit (INDEPENDENT_AMBULATORY_CARE_PROVIDER_SITE_OTHER): Payer: Medicare Other | Admitting: Family Medicine

## 2022-05-21 ENCOUNTER — Ambulatory Visit: Payer: Medicare Other

## 2022-05-21 VITALS — BP 130/62 | HR 55 | Temp 98.6°F | Ht 72.0 in | Wt 174.2 lb

## 2022-05-21 DIAGNOSIS — I2510 Atherosclerotic heart disease of native coronary artery without angina pectoris: Secondary | ICD-10-CM | POA: Diagnosis not present

## 2022-05-21 DIAGNOSIS — H9190 Unspecified hearing loss, unspecified ear: Secondary | ICD-10-CM | POA: Diagnosis not present

## 2022-05-21 DIAGNOSIS — E538 Deficiency of other specified B group vitamins: Secondary | ICD-10-CM

## 2022-05-21 DIAGNOSIS — I10 Essential (primary) hypertension: Secondary | ICD-10-CM | POA: Diagnosis not present

## 2022-05-21 MED ORDER — LOSARTAN POTASSIUM 25 MG PO TABS: 12.5000 mg | ORAL_TABLET | Freq: Every day | ORAL | 3 refills | Status: DC

## 2022-05-21 MED ORDER — CYANOCOBALAMIN 1000 MCG/ML IJ SOLN
1000.0000 ug | Freq: Once | INTRAMUSCULAR | Status: AC
  Administered 2022-05-21: 1000 ug via INTRAMUSCULAR

## 2022-05-21 NOTE — Progress Notes (Signed)
Phone [phone] In person visit   Subjective:   Joseph Simpson is a 87 y.o. year old very pleasant male patient who presents for/with See problem oriented charting Chief Complaint  Patient presents with   audiology    Pt would like referral to audiology due to not being able to hear the tv and when people talk due to things sounding muffled.   Past Medical History-  Patient Active Problem List   Diagnosis Date Noted   CAD (coronary artery disease)     Priority: High   Macular degeneration 11/17/2011    Priority: High   Hyperglycemia 12/21/2019    Priority: Medium    B12 deficiency 03/09/2019    Priority: Medium    Hyperlipidemia 06/10/2016    Priority: Medium    BPH (benign prostatic hyperplasia) 11/23/2014    Priority: Medium    Palpitations 07/31/2014    Priority: Medium    Dyspnea 02/27/2014    Priority: Medium    Essential hypertension 09/23/2007    Priority: Medium    GERD 09/23/2007    Priority: Medium    Osteoarthritis of left hip 01/30/2019    Priority: Low   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 12/29/2017    Priority: Low   PAC (premature atrial contraction) 10/25/2015    Priority: Low   Squamous cell carcinoma in situ 09/09/2015    Priority: Low   Cough 05/10/2014    Priority: Low   Pulmonary nodule 05/10/2014    Priority: Low   Murmur 02/10/2012    Priority: Low   Benign neoplasm of skin of lower limb 03/24/2022   Lentigo 03/24/2022   Melanocytic nevi of trunk 03/24/2022   Melanocytic nevus of face 03/24/2022   Other seborrheic keratosis 03/24/2022   Personal history of other specified conditions 03/24/2022   Physical deconditioning 01/08/2022   GI bleed 01/07/2022   UTI (urinary tract infection) 01/07/2022   Protein-calorie malnutrition, moderate ([place]) 01/07/2022   Left wrist pain 10/01/2021   Bilateral foot pain 07/04/2021   S/P hip replacement, left 02/11/2021   Syncope    New onset a-fib ([place])    Gastroparesis  02/15/2017   Hx of adenomatous colonic polyps 02/15/2017   Left inguinal hernia 07/12/2016    Medications- reviewed and updated Current Outpatient Medications  Medication Sig Dispense Refill   acetaminophen (TYLENOL) 500 MG tablet Take 500-1,000 mg by mouth 2 (two) times daily as needed for moderate pain or headache.     apixaban (ELIQUIS) 5 MG TABS tablet Take 1 tablet (5 mg total) by mouth 2 (two) times daily. 180 tablet 3   atorvastatin (LIPITOR) 20 MG tablet TAKE ONE TABLET BY MOUTH THREE TIMES A WEEK 39 tablet 3   bevacizumab (AVASTIN) 2.5 mg/0.1 mL SOLN 2.5 mg by Intravitreal route. Injection to eyes every 5 weeks - use with vigamox     Carboxymethylcellulose Sodium 1 % GEL Place 1 drop into both eyes at bedtime.     Cyanocobalamin (B-12 COMPLIANCE INJECTION IJ) Inject 1 Dose as directed every 30 (thirty) days.     metoCLOPramide (REGLAN) 5 MG tablet Take 5 mg by mouth 2 (two) times daily.     metoprolol tartrate (LOPRESSOR) 25 MG tablet Take 12.5 mg by mouth 2 (two) times daily.     neomycin-polymyxin b-dexamethasone (MAXITROL) 3.5-10000-0.1 OINT      pantoprazole (PROTONIX) 40 MG tablet Take 1 tablet by mouth 2 (two) times daily before a meal.     polyethylene glycol (MIRALAX /  GLYCOLAX) 17 g packet Take 17 g by mouth daily as needed for moderate constipation.     Propylene Glycol (SYSTANE BALANCE OP) Place 1 drop into both eyes daily.     tamsulosin (FLOMAX) 0.4 MG CAPS capsule Take 0.4 mg by mouth daily.     VIGAMOX 0.5 % ophthalmic solution Place 1 drop into both eyes as directed. Take every 5 weeks as directed - use with avastin, instill 1 drop into both eyes 4 times daily for 2 days after eye injections     losartan (COZAAR) 25 MG tablet Take 0.5 tablets (12.5 mg total) by mouth daily. If blood pressure over 145 when you check- ok to take other half tablet once per day (so total of '25mg'$  per day) 90 tablet 3   No current facility-administered medications for this visit.      Objective:  BP 130/62   Pulse (!) 55   Temp 98.6 F (37 C)   Ht 6' (1.829 m)   Wt 174 lb 3.2 oz (79 kg)   SpO2 96%   BMI 23.63 kg/m  Gen: NAD, resting comfortably CV: mild bradycardia, no murmurs rubs or gallops Lungs: CTAB no crackles, wheeze, rhonchi Ext: no edema Skin: warm, dry    Assessment and Plan   # Hearing loss S: Patient has noted that when he listens to the TV he is not able to hear well.  When people speak things are more muffled now.  He reports issues with both ears. Issues at least 6 months- gradual. Some ringing.  A/P: hearing loss bilateral- refer to audiology   #hypertension S: medication: Losartan 12.5 mg daily, metoprolol 12.5 mg BID Home readings #s: some variability as low as 110 and as high as 967 with sdiastolic always under 83 -no no chest pain or shortness of breath BP Readings from Last 3 Encounters:  05/21/22 130/62  03/24/22 120/70  01/27/22 120/60  A/P: blood pressure looks good here and variable at home- with lows such as this morning at home as low as 110 do no think we should increase medicine- continue to monitor and if notes trending up any further to let us know- may need to go back to full tablet of losartan -discussed option if blood pressure of 145 or greater- can take extra half tablet of losartan 25 mg (so total of 25 mg per day) and if still running high come back to see [place]  # B12 deficiency S: Current treatment/medication (oral vs. IM): b12 injections monthly  Lab Results  Component Value Date   VITAMINB12 1,040 (H) 01/07/2022  A/P: has been well controlled- injection given today- recheck at least annually   #some sleep issues- discussed blue light risk and cutting out at least 30 mins to an hour  Recommended follow up: can push September visit out to perhaps 4-6 months from today Future Appointments  Date Time Provider [place]  06/11/2022  7:45 AM Hayden Pedro, MD [facility] None  08/04/2022 11:00 AM Marin Olp, MD [facility] [place]  09/17/2022  8:45 AM [facility] HEALTH COACH [facility]  09/28/2022  2:00 PM Martinique, Peter M, MD [facility]  09/28/2022  3:00 PM [facility] CCM PHARMACIST [facility]    Lab/Order associations:   ICD-10-CM   1. Essential hypertension  I10     2. B12 deficiency  E53.8 cyanocobalamin ((VITAMIN B-12)) injection 1,000 mcg    3. Hearing loss, unspecified hearing loss type, unspecified laterality  H91.90 Ambulatory referral to Audiology  Meds ordered this encounter  Medications   cyanocobalamin ((VITAMIN B-12)) injection 1,000 mcg   losartan (COZAAR) 25 MG tablet    Sig: Take 0.5 tablets (12.5 mg total) by mouth daily. If blood pressure over 145 when you check- ok to take other half tablet once per day (so total of '25mg'$  per day)    Dispense:  90 tablet    Refill:  3    Return precautions advised.  Garret Reddish, MD

## 2022-05-21 NOTE — Patient Instructions (Addendum)
We will call you within two weeks about your referral to audiology. If you do not hear within 2 weeks, give [facility] a call directly with # you mentioned.   -discussed option if blood pressure of 145 or greater- can take extra half tablet of losartan 25 mg (so total of 25 mg per day) and if still running high come back to see [place]  Recommended follow up: can push September visit out to perhaps 4-6 months from today

## 2022-05-26 ENCOUNTER — Telehealth: Payer: Self-pay | Admitting: Gastroenterology

## 2022-05-26 NOTE — Telephone Encounter (Signed)
Good Morning Dr. Havery Moros,  D.O.D for 3/1 AM   We have received a referral for patient to be seen for Gastrointestinal hemorrhage. Patient has GI history with [facility] with Dr. Erlene Quan. Patient is wanting to transfer is care to our office due to Dr. Erlene Quan retiring. I will be sending patients records for you to review, will you please review and advise on scheduling?  Thank you.

## 2022-05-27 ENCOUNTER — Telehealth: Payer: Self-pay | Admitting: Gastroenterology

## 2022-05-27 MED ORDER — PANTOPRAZOLE SODIUM 40 MG PO TBEC
40.0000 mg | DELAYED_RELEASE_TABLET | Freq: Two times a day (BID) | ORAL | 0 refills | Status: DC
Start: 2022-05-27 — End: 2022-06-01

## 2022-05-27 MED ORDER — METOCLOPRAMIDE HCL 5 MG PO TABS: 5.0000 mg | ORAL_TABLET | Freq: Two times a day (BID) | ORAL | 0 refills | Status: DC

## 2022-05-27 NOTE — Addendum Note (Signed)
Addended by: Clyde Lundborg A on: 05/27/2022 02:11 PM   Modules accepted: Orders

## 2022-05-27 NOTE — Telephone Encounter (Signed)
I spoke with Joseph Simpson and I am not able to triage this request since Joseph Simpson has never been seen here before. There was an alternate phone note sent to me with a request for a refill of Reglan and Protonix. I called and spoke with Joseph Simpson. I informed Joseph Simpson that since he has never been seen here before we can't fill these medications. I encouraged pt to reach out to his previous GI office or his PCP for refills in the interim. Pt is aware that Dr. Havery Moros is out of the office this week and will not be able to review his records until he returns next week. Pt knows that we will contact him once we have a decision from Dr. Havery Moros about transfer of care. Pt verbalized understanding and had no concerns at the end of the call.

## 2022-05-27 NOTE — Telephone Encounter (Signed)
I called and spoke with patient. I informed patient that since he has never been seen here before we can't fill these medications. I encouraged pt to reach out to his previous GI office or his PCP for refills in the interim. Pt is aware that Dr. Havery Moros is out of the office this week and will not be able to review his records until he returns next week. Pt knows that we will contact him once we have a decision from Dr. Havery Moros about transfer of care. Pt verbalized understanding and had no concerns at the end of the call.

## 2022-05-27 NOTE — Telephone Encounter (Signed)
Refilled enough for pt until provider returns.

## 2022-05-27 NOTE — Telephone Encounter (Signed)
Patient called and explained his current dilemma. Although he has called his former gastroenterologist to ask for a short term refill of the protonix 40 mg BID and reglan 5 mg BID, they said they could not do this due to his care being fully transferred to Dr. Doyne Keel office. At this time he is hoping that Dr. Yong Channel will be able to fill these medications for him until Dr. Lynnell Chad is back in office next week. Please advise.

## 2022-06-01 MED ORDER — PANTOPRAZOLE SODIUM 40 MG PO TBEC: 40.0000 mg | DELAYED_RELEASE_TABLET | Freq: Two times a day (BID) | ORAL | 1 refills | Status: DC

## 2022-06-01 MED ORDER — METOCLOPRAMIDE HCL 5 MG PO TABS: 5.0000 mg | ORAL_TABLET | Freq: Two times a day (BID) | ORAL | 1 refills | Status: DC

## 2022-06-01 NOTE — Telephone Encounter (Signed)
I reviewed his records from Dr. Erlene Quan, he has not been seen in over a year there, most notes are from years ago. I don't think Dr. Erlene Quan placed his most recent referral and it is unclear what this referral is for based on the records reviewed. That being said I am happy to see him, can book him an office visit with me to establish care. If he needs an urgent visit for a particular issue his PCP will have to contact us to clarify what this is for. Again his referral is from March so this would not appear to be an urgent issue at this time. Thanks

## 2022-06-01 NOTE — Telephone Encounter (Signed)
Called and spoke with patient. He has been scheduled for a new pt appt with Dr. Havery Moros on Friday, 07/17/22 at 1:40 pm. Pt is aware that I will mail him a copy of his appt information, he confirmed pharmacy on file. Pt is requesting refills of Protonix and Reglan. I advised pt to reach out to his PCP, but they only gave him a 1-week supply. He has a hx of GERD and gastroparesis. Please advise if OK to refill.

## 2022-06-01 NOTE — Telephone Encounter (Signed)
If he has been on these chronically we can refill that for him.  Hopefully he is aware that our policy is that we generally do not refill this until we see the patient as Reglan does have risks.  If his PCP will not give him any more than 1 week supply, we can give him enough to get him through to our clinic visit if he has been taking this chronically.  Thanks

## 2022-06-01 NOTE — Telephone Encounter (Signed)
Called and spoke with patient. Pt has been notified of our policy regarding Reglan. He states that he has been on Reglan for about 40 years and has had no problems. Pt would like refills [facility] to [facility] on file. Pt had no concerns at the end of the call.

## 2022-06-04 ENCOUNTER — Ambulatory Visit: Payer: Medicare Other | Attending: Audiologist | Admitting: Audiologist

## 2022-06-04 DIAGNOSIS — H9313 Tinnitus, bilateral: Secondary | ICD-10-CM | POA: Insufficient documentation

## 2022-06-04 DIAGNOSIS — H903 Sensorineural hearing loss, bilateral: Secondary | ICD-10-CM | POA: Diagnosis not present

## 2022-06-04 NOTE — Procedures (Signed)
Outpatient Audiology and [place], [place]  [id number] [phone]  AUDIOLOGICAL  EVALUATION  NAME: Joseph Simpson     DOB:   07-21-1935      MRN: [id number]                                                                                     DATE: 06/04/2022     REFERENT: Marin Olp, MD STATUS: Outpatient DIAGNOSIS: Sensorineural Hearing Loss Bilateral     History: Joseph Simpson was seen for an audiological evaluation.  Joseph Simpson is receiving a hearing evaluation due to concerns for difficulty hearing women on the TV and hearing his 60 year old great granddaughter. Joseph Simpson has difficulty hearing in all environments. This difficulty began gradually.  No pain or pressure reported in either ear. Tinnitus present in both ears ever since [employer] service with exposure to bazookas. Medical history negative for a health which is a risk factor for hearing loss. No other relevant case history reported.   Evaluation:  Otoscopy showed no view of the tympanic membrane due to significant cerumen build up, bilaterally Tympanometry results were consistent with normal middle ear pressure, bilaterally   Audiometric testing was completed using conventional audiometry with supraural transducer. Speech Recognition Thresholds were  40dB in the right ear and 55dB in the left ear. Word Recognition was performed at 80dB, scored 96% in the right ear and at 85dB in the left ear, scored 72%. Pure tone thresholds show mild sloping to severe sensorineural hearing loss bilaterally.   Results:  The test results were reviewed with Joseph Simpson. He needs to remove the cerumen from both ears with drops. Joseph Simpson needs hearing aids for both ears due to mild sloping to severe sensorineural hearing loss in each ear. Joseph Simpson was informed on the nature and degree of his loss and given a copy of his audiogram. Joseph Simpson is interested in trying hearing aids. He used to have a [facility] rep, he was told to try and  contact them if he wants to pursue aids through the [place].    Recommendations: Amplification is necessary for both ears. Hearing aids can be purchased from a variety of locations. See provided list for locations in the [place] area. Talk to your local [facility] representative about obtaining aids through service connection. Debrox Earwax Removal Drops are a safe and inexpensive in-home solution for wax removal. Debrox Earwax Removal Kit includes a soft rubber bulb syringe to rinse your ear after using Debrox Earwax Removal Drops. Excessive earwax build-up can lead to ear discomfort and reduced hearing, which can affect your day-to-day life. The kit can be purchased over the counter at [place], [place], [facility], and most other pharmacies.   How to use the Debrox Earwax Removal Drops Kit:  tilt head sideways. place 5 to 10 drops into ear. tip of applicator should not enter ear canal. keep drops in ear for several minutes by keeping head tilted or placing cotton in the ear. use twice daily for up to four days  gently flush ear with water, using soft rubber bulb syringe after final treatment (on 4th day)       45 minutes spent  testing and counseling on results.   Alfonse Alpers  Audiologist, Au.D., CCC-A 06/04/2022  11:22 AM  Cc: Marin Olp, MD

## 2022-06-09 DIAGNOSIS — L988 Other specified disorders of the skin and subcutaneous tissue: Secondary | ICD-10-CM | POA: Diagnosis not present

## 2022-06-09 DIAGNOSIS — C44519 Basal cell carcinoma of skin of other part of trunk: Secondary | ICD-10-CM | POA: Diagnosis not present

## 2022-06-11 ENCOUNTER — Encounter (INDEPENDENT_AMBULATORY_CARE_PROVIDER_SITE_OTHER): Payer: Medicare Other | Admitting: Ophthalmology

## 2022-06-11 DIAGNOSIS — H353231 Exudative age-related macular degeneration, bilateral, with active choroidal neovascularization: Secondary | ICD-10-CM

## 2022-06-11 DIAGNOSIS — H35033 Hypertensive retinopathy, bilateral: Secondary | ICD-10-CM | POA: Diagnosis not present

## 2022-06-11 DIAGNOSIS — H43813 Vitreous degeneration, bilateral: Secondary | ICD-10-CM | POA: Diagnosis not present

## 2022-06-11 DIAGNOSIS — I10 Essential (primary) hypertension: Secondary | ICD-10-CM | POA: Diagnosis not present

## 2022-06-11 DIAGNOSIS — D3132 Benign neoplasm of left choroid: Secondary | ICD-10-CM

## 2022-06-11 DIAGNOSIS — H33301 Unspecified retinal break, right eye: Secondary | ICD-10-CM | POA: Diagnosis not present

## 2022-06-17 ENCOUNTER — Telehealth: Payer: Self-pay | Admitting: Pharmacist

## 2022-06-17 NOTE — Progress Notes (Signed)
Chronic Care Management Pharmacy Assistant   Name: Joseph Simpson  MRN: [id number] DOB: 26-Jul-1935  Reason for Encounter: Hypertension Adherence Call    Recent office visits:  05/21/2022 OV (PCP) Marin Olp, MD;  blood pressure looks good here and variable at home- with lows such as this morning at home as low as 110 do no think we should increase medicine- continue to monitor and if notes trending up any further to let us know- may need to go back to full tablet of losartan -discussed option if blood pressure of 145 or greater- can take extra half tablet of losartan 25 mg (so total of 25 mg per day) and if still running high come back to see [place].  03/18/2022 OV (PCP) Marin Olp, MD; no medication changes indicated.  Recent consult visits:  03/24/2022 OV (Cardiology) Martinique, Peter M, MD; no medication changes indicated.  Hospital visits:  None in previous 6 months  Medications: Outpatient Encounter Medications as of 06/17/2022  Medication Sig   acetaminophen (TYLENOL) 500 MG tablet Take 500-1,000 mg by mouth 2 (two) times daily as needed for moderate pain or headache.   apixaban (ELIQUIS) 5 MG TABS tablet Take 1 tablet (5 mg total) by mouth 2 (two) times daily.   atorvastatin (LIPITOR) 20 MG tablet TAKE ONE TABLET BY MOUTH THREE TIMES A WEEK   bevacizumab (AVASTIN) 2.5 mg/0.1 mL SOLN 2.5 mg by Intravitreal route. Injection to eyes every 5 weeks - use with vigamox   Carboxymethylcellulose Sodium 1 % GEL Place 1 drop into both eyes at bedtime.   Cyanocobalamin (B-12 COMPLIANCE INJECTION IJ) Inject 1 Dose as directed every 30 (thirty) days.   losartan (COZAAR) 25 MG tablet Take 0.5 tablets (12.5 mg total) by mouth daily. If blood pressure over 145 when you check- ok to take other half tablet once per day (so total of '25mg'$  per day)   metoCLOPramide (REGLAN) 5 MG tablet Take 1 tablet (5 mg total) by mouth 2 (two) times daily.   metoprolol tartrate (LOPRESSOR) 25 MG tablet Take  12.5 mg by mouth 2 (two) times daily.   neomycin-polymyxin b-dexamethasone (MAXITROL) 3.5-10000-0.1 OINT    pantoprazole (PROTONIX) 40 MG tablet Take 1 tablet (40 mg total) by mouth 2 (two) times daily before a meal.   polyethylene glycol (MIRALAX / GLYCOLAX) 17 g packet Take 17 g by mouth daily as needed for moderate constipation.   Propylene Glycol (SYSTANE BALANCE OP) Place 1 drop into both eyes daily.   tamsulosin (FLOMAX) 0.4 MG CAPS capsule Take 0.4 mg by mouth daily.   VIGAMOX 0.5 % ophthalmic solution Place 1 drop into both eyes as directed. Take every 5 weeks as directed - use with avastin, instill 1 drop into both eyes 4 times daily for 2 days after eye injections   No facility-administered encounter medications on file as of 06/17/2022.   Reviewed chart prior to disease state call. Spoke with patient regarding BP  Recent Office Vitals: BP Readings from Last 3 Encounters:  05/21/22 130/62  03/24/22 120/70  01/27/22 120/60   Pulse Readings from Last 3 Encounters:  05/21/22 (!) 55  03/24/22 76  01/27/22 (!) 52    Wt Readings from Last 3 Encounters:  05/21/22 174 lb 3.2 oz (79 kg)  03/24/22 175 lb (79.4 kg)  01/27/22 170 lb (77.1 kg)     Kidney Function Lab Results  Component Value Date/Time   CREATININE 0.65 01/14/2022 08:41 AM   CREATININE 0.70 01/09/2022 03:55  AM   CREATININE 0.80 06/19/2020 10:33 AM   GFR 86.14 01/14/2022 08:41 AM   GFRNONAA >60 01/09/2022 03:55 AM   GFRAA >60 12/15/2017 12:56 PM       Latest Ref Rng & Units 01/14/2022    8:41 AM 01/09/2022    3:55 AM 01/08/2022    4:07 AM  BMP  Glucose 70 - 99 mg/dL 108  124  133   BUN 6 - 23 mg/dL '11  14  15   '$ Creatinine 0.40 - 1.50 mg/dL 0.65  0.70  0.66   Sodium 135 - 145 mEq/L 137  131  134   Potassium 3.5 - 5.1 mEq/L 4.1  3.1  3.5   Chloride 96 - 112 mEq/L 100  94  96   CO2 19 - 32 mEq/L 30  32  32   Calcium 8.4 - 10.5 mg/dL 9.3  8.1  8.5     Current antihypertensive regimen:  Losartan 12.5 mg daily  - may take 25 mg daily if BP over 145 Metoprolol Tartrate 12.5 mg twice daily  How often are you checking your Blood Pressure? daily  Current home BP readings: 145/50  What recent interventions/DTPs have been made by any provider to improve Blood Pressure control since last CPP Visit: Losartan increased to twice daily if BP is over 145.  Any recent hospitalizations or ED visits since last visit with CPP? No  What diet changes have been made to improve Blood Pressure Control?  Patient states he "eats well." Patient states "I have no problem with my diet."  What exercise is being done to improve your Blood Pressure Control?  Patient states he "walks some."  Adherence Review: Is the patient currently on ACE/ARB medication? Yes Does the patient have >5 day gap between last estimated fill dates? No  -Patient states his blood pressure has been running 145 or higher and his diastolic is "low". He states he has been taking the second dose of Losartan.  -He also states he hasn't been feeling well. He has had headaches and "feeling kind of woozy and lightheaded."  -He would like to know if he should make further changes to his blood pressure medication?  Care Gaps: Medicare Annual Wellness: Completed Hemoglobin A1C: 5.8% on 01/27/2022 Colonoscopy: Aged out  Future Appointments  Date Time Provider [place]  06/23/2022  9:30 AM [facility] NURSE [facility]  07/09/2022  7:45 AM Hayden Pedro, MD [facility] None  07/17/2022  1:40 PM Armbruster, Carlota Raspberry, MD [facility]  09/17/2022  8:45 AM [facility] HEALTH COACH [facility]  09/21/2022  9:20 AM Marin Olp, MD [facility]  09/28/2022  2:00 PM Martinique, Peter M, MD [facility]  09/28/2022  3:00 PM [facility] CCM PHARMACIST [facility]   Star Rating Drugs: Atorvastatin 20 mg last filled 06/02/2022 84 DS Losartan 25 mg last filled 05/01/2022 60 DS  April D Calhoun, [place] Pharmacist Assistant [phone]

## 2022-06-17 NOTE — Chronic Care Management (AMB) (Signed)
FYI CMA reported this information during a follow up call.  -Patient states his blood pressure has been running 145 or higher and his diastolic is "low". He states he has been taking the second dose of Losartan.   -He also states he hasn't been feeling well. He has had headaches and "feeling kind of woozy and lightheaded."   -He would like to know if he should make further changes to his blood pressure medication?  Do you want me to have him come in to see you?  Beverly Milch, PharmD Clinical Pharmacist  [facility] [phone]

## 2022-06-18 NOTE — Telephone Encounter (Signed)
Please call and schedule pt f/u for BP.

## 2022-06-23 ENCOUNTER — Ambulatory Visit (INDEPENDENT_AMBULATORY_CARE_PROVIDER_SITE_OTHER): Payer: Medicare Other

## 2022-06-23 DIAGNOSIS — E538 Deficiency of other specified B group vitamins: Secondary | ICD-10-CM | POA: Diagnosis not present

## 2022-06-23 MED ORDER — CYANOCOBALAMIN 1000 MCG/ML IJ SOLN
1000.0000 ug | Freq: Once | INTRAMUSCULAR | Status: AC
  Administered 2022-06-23: 1000 ug via INTRAMUSCULAR

## 2022-06-23 NOTE — Progress Notes (Signed)
Pt here for B12 injection for Dr. Hunter. Injection given in left deltoid. Pt tolerated well.   

## 2022-07-09 ENCOUNTER — Encounter (INDEPENDENT_AMBULATORY_CARE_PROVIDER_SITE_OTHER): Payer: Medicare Other | Admitting: Ophthalmology

## 2022-07-09 DIAGNOSIS — H43813 Vitreous degeneration, bilateral: Secondary | ICD-10-CM | POA: Diagnosis not present

## 2022-07-09 DIAGNOSIS — H35033 Hypertensive retinopathy, bilateral: Secondary | ICD-10-CM

## 2022-07-09 DIAGNOSIS — H353231 Exudative age-related macular degeneration, bilateral, with active choroidal neovascularization: Secondary | ICD-10-CM

## 2022-07-09 DIAGNOSIS — D3132 Benign neoplasm of left choroid: Secondary | ICD-10-CM | POA: Diagnosis not present

## 2022-07-09 DIAGNOSIS — I10 Essential (primary) hypertension: Secondary | ICD-10-CM | POA: Diagnosis not present

## 2022-07-13 ENCOUNTER — Ambulatory Visit (INDEPENDENT_AMBULATORY_CARE_PROVIDER_SITE_OTHER): Payer: Medicare Other | Admitting: Family Medicine

## 2022-07-13 ENCOUNTER — Encounter: Payer: Self-pay | Admitting: Family Medicine

## 2022-07-13 VITALS — BP 120/62 | HR 66 | Temp 98.4°F | Ht 72.0 in | Wt 177.8 lb

## 2022-07-13 DIAGNOSIS — Z23 Encounter for immunization: Secondary | ICD-10-CM

## 2022-07-13 DIAGNOSIS — I2510 Atherosclerotic heart disease of native coronary artery without angina pectoris: Secondary | ICD-10-CM | POA: Diagnosis not present

## 2022-07-13 DIAGNOSIS — I10 Essential (primary) hypertension: Secondary | ICD-10-CM | POA: Diagnosis not present

## 2022-07-13 NOTE — Patient Instructions (Addendum)
Health Maintenance Due  Topic Date Due   COVID-19 Vaccine - new vaccine in october 12/23/2021   INFLUENZA VACCINE - high dose later in serieswe should have these available within a month or two but please let us know if you get at outside pharmacy  06/16/2022   Prevnar 20 today  blood pressure is doing better with new regimen- we will continue metorpolol 12.5 mg twice daily and losartan 12.5 mg unless blood pressure is above 145 on top- will take another 12.5 mg    Recommended follow up: Return for next already scheduled visit or sooner if needed.

## 2022-07-13 NOTE — Progress Notes (Signed)
Phone [phone] In person visit   Subjective:   Joseph Simpson is a 87 y.o. year old very pleasant male patient who presents for/with See problem oriented charting Chief Complaint  Patient presents with   Follow-up   Hypertension   Hyperlipidemia    Past Medical History-  Patient Active Problem List   Diagnosis Date Noted   CAD (coronary artery disease)     Priority: High   Macular degeneration 11/17/2011    Priority: High   Hyperglycemia 12/21/2019    Priority: Medium    B12 deficiency 03/09/2019    Priority: Medium    Hyperlipidemia 06/10/2016    Priority: Medium    BPH (benign prostatic hyperplasia) 11/23/2014    Priority: Medium    Palpitations 07/31/2014    Priority: Medium    Dyspnea 02/27/2014    Priority: Medium    Essential hypertension 09/23/2007    Priority: Medium    GERD 09/23/2007    Priority: Medium    Osteoarthritis of left hip 01/30/2019    Priority: Low   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 12/29/2017    Priority: Low   PAC (premature atrial contraction) 10/25/2015    Priority: Low   Squamous cell carcinoma in situ 09/09/2015    Priority: Low   Cough 05/10/2014    Priority: Low   Pulmonary nodule 05/10/2014    Priority: Low   Murmur 02/10/2012    Priority: Low   Benign neoplasm of skin of lower limb 03/24/2022   Lentigo 03/24/2022   Melanocytic nevi of trunk 03/24/2022   Melanocytic nevus of face 03/24/2022   Other seborrheic keratosis 03/24/2022   Personal history of other specified conditions 03/24/2022   Physical deconditioning 01/08/2022   GI bleed 01/07/2022   UTI (urinary tract infection) 01/07/2022   Protein-calorie malnutrition, moderate ([employer]) 01/07/2022   Left wrist pain 10/01/2021   Bilateral foot pain 07/04/2021   S/P hip replacement, left 02/11/2021   Syncope    New onset a-fib ([employer])    Gastroparesis 02/15/2017   Hx of adenomatous colonic polyps 02/15/2017   Left inguinal hernia 07/12/2016     Medications- reviewed and updated Current Outpatient Medications  Medication Sig Dispense Refill   acetaminophen (TYLENOL) 500 MG tablet Take 500-1,000 mg by mouth 2 (two) times daily as needed for moderate pain or headache.     apixaban (ELIQUIS) 5 MG TABS tablet Take 1 tablet (5 mg total) by mouth 2 (two) times daily. 180 tablet 3   atorvastatin (LIPITOR) 20 MG tablet TAKE ONE TABLET BY MOUTH THREE TIMES A WEEK 39 tablet 3   bevacizumab (AVASTIN) 2.5 mg/0.1 mL SOLN 2.5 mg by Intravitreal route. Injection to eyes every 5 weeks - use with vigamox     Carboxymethylcellulose Sodium 1 % GEL Place 1 drop into both eyes at bedtime.     Cyanocobalamin (B-12 COMPLIANCE INJECTION IJ) Inject 1 Dose as directed every 30 (thirty) days.     losartan (COZAAR) 25 MG tablet Take 0.5 tablets (12.5 mg total) by mouth daily. If blood pressure over 145 when you check- ok to take other half tablet once per day (so total of '25mg'$  per day) 90 tablet 3   metoCLOPramide (REGLAN) 5 MG tablet Take 1 tablet (5 mg total) by mouth 2 (two) times daily. 60 tablet 1   metoprolol tartrate (LOPRESSOR) 25 MG tablet Take 12.5 mg by mouth 2 (two) times daily.     neomycin-polymyxin b-dexamethasone (MAXITROL) 3.5-10000-0.1 OINT  pantoprazole (PROTONIX) 40 MG tablet Take 1 tablet (40 mg total) by mouth 2 (two) times daily before a meal. 60 tablet 1   polyethylene glycol (MIRALAX / GLYCOLAX) 17 g packet Take 17 g by mouth daily as needed for moderate constipation.     Propylene Glycol (SYSTANE BALANCE OP) Place 1 drop into both eyes daily.     tamsulosin (FLOMAX) 0.4 MG CAPS capsule Take 0.4 mg by mouth daily.     VIGAMOX 0.5 % ophthalmic solution Place 1 drop into both eyes as directed. Take every 5 weeks as directed - use with avastin, instill 1 drop into both eyes 4 times daily for 2 days after eye injections     No current facility-administered medications for this visit.     Objective:  BP 120/62   Pulse 66   Temp  98.4 F (36.9 C)   Ht 6' (1.829 m)   Wt 177 lb 12.8 oz (80.6 kg)   SpO2 94%   BMI 24.11 kg/m  Gen: NAD, resting comfortably CV: RRR (occasional skip beat with extended listening) stable murmur Lungs: CTAB no crackles, wheeze, rhonchi Ext: minimal edema Skin: warm, dry     Assessment and Plan   #Hypertension  S: Controlled on losartan 12.5 mg daily(with opti0n to take full 25 mg if Bp elevated  >145 as has had variability- he has had to take twice), metoprolol 12.5 mg twice daily  Monitors his home blood pressures-examples of home readings in recent days 134/70, 138/82, 132/64, 130-67 , 151-72 (took full losartan 25 mg) and 132/70 yesterday - 3 weeks ago was getting lightheaded when BP higher- felt better better when took full tablet BP Readings from Last 3 Encounters:  07/13/22 120/62  05/21/22 130/62  03/24/22 120/70  A/P: blood pressure is doing better with new regimen- we will continue metorpolol 12.5 mg twice daily and losartan 12.5 mg unless blood pressure is above 145 on top- will take another 12.5 mg    #Hearing loss- has new aids but has to get adjusted tomorrow- had for 2 weeks. Hearing tv somewhat better   Recommended follow up: Return for next already scheduled visit or sooner if needed. Future Appointments  Date Time Provider [place]  07/17/2022  1:40 PM Armbruster, Carlota Raspberry, MD [facility]  07/23/2022  9:30 AM [facility] NURSE [facility]  08/06/2022  8:00 AM Hayden Pedro, MD [facility] None  09/17/2022  8:45 AM [facility] HEALTH COACH [facility]  09/21/2022  9:20 AM Marin Olp, MD [facility]  09/28/2022  2:00 PM Martinique, Peter M, MD [facility] None  09/28/2022  3:00 PM [facility] CCM PHARMACIST [facility]    Lab/Order associations:   ICD-10-CM   1. Essential hypertension  I10       Return precautions advised.  Garret Reddish, MD

## 2022-07-13 NOTE — Addendum Note (Signed)
Addended by: Clyde Lundborg A on: 07/13/2022 04:40 PM   Modules accepted: Orders

## 2022-07-17 ENCOUNTER — Encounter: Payer: Self-pay | Admitting: Gastroenterology

## 2022-07-17 ENCOUNTER — Ambulatory Visit (INDEPENDENT_AMBULATORY_CARE_PROVIDER_SITE_OTHER): Payer: Medicare Other | Admitting: Gastroenterology

## 2022-07-17 VITALS — BP 130/64 | HR 67 | Ht 73.0 in | Wt 176.4 lb

## 2022-07-17 DIAGNOSIS — Z7901 Long term (current) use of anticoagulants: Secondary | ICD-10-CM

## 2022-07-17 DIAGNOSIS — K2101 Gastro-esophageal reflux disease with esophagitis, with bleeding: Secondary | ICD-10-CM

## 2022-07-17 DIAGNOSIS — K3184 Gastroparesis: Secondary | ICD-10-CM | POA: Diagnosis not present

## 2022-07-17 DIAGNOSIS — Z79899 Other long term (current) drug therapy: Secondary | ICD-10-CM | POA: Diagnosis not present

## 2022-07-17 DIAGNOSIS — I2510 Atherosclerotic heart disease of native coronary artery without angina pectoris: Secondary | ICD-10-CM

## 2022-07-17 DIAGNOSIS — Z8711 Personal history of peptic ulcer disease: Secondary | ICD-10-CM | POA: Diagnosis not present

## 2022-07-17 NOTE — Progress Notes (Signed)
HPI :  87 year old male with a history of CAD, A-fib on Eliquis, peptic ulcer disease status post Billroth II, GI bleed secondary to esophagitis, here to establish his GI care, referred by Garret Reddish, MD. History provided by the patient and review of his medical records.  He previously was followed by Dr. Erlene Quan for many years.  The patient reports he had a history of a perforated peptic ulcer leading to a surgery with Billroth II many years ago (he thinks about 40 years ago or so).  He states since that surgery he had gastroparesis and has been on Reglan for many years to control symptoms related to that, as well as chronic PPI therapy.  Generally he has been doing pretty well at baseline over the years for management of these issues.  He was hospitalized in February for renal stones, sounds like he had some removed, was sent home, came back with a bladder infection.  He was admitted to the hospital for several days, during this time of infection he had some episodes of vomiting and was noted to have coffee-ground emesis but a normal hemoglobin.  He underwent an EGD with Dr. Paulita Fujita of [facility] in February.  Findings as below, he had LA grade D esophagitis in the setting of vomiting, and gastritis.  He denied NSAID use around that time.  He reports being on Protonix for a long time but is unclear of dose over the years.  Most recently he has been on Protonix 40 mg twice daily as well as Reglan 5 mg 2 times daily.  He denies any upper tract symptoms at this time.  He does not have any reflux that bothers him unless he has a trigger food, he states strawberries cause him to have reflux.  He denies any dysphagia.  No nausea or vomiting.  No abdominal pains.  He is eating well.  He had lost some weight during his hospitalization but he has since regained it.  He moves his bowels regularly with use of Colace daily.  He has no diarrhea, no constipation, no blood in his stools.  His hemoglobin has remained  normal, renal function normal, no history of osteoporosis or osteopenia.  In recent months he was placed on Eliquis for atrial fibrillation.  He is tolerating this well without any recurrent bleeding symptoms.  His last colonoscopy was in 2015, he had a few small polyps removed as below.  He otherwise feels well today without complaints.  No cardiopulmonary symptoms.  We discussed if he wanted any surveillance endoscopy moving forward given his age and comorbidities.  Prior work-up: EGD 01/06/22: Dr. Paulita Fujita LA Grade D (one or more mucosal breaks involving at least 75% of esophageal circumference) esophagitis was found. Diffuse moderate mucosal changes characterized by congestion were found in the entire esophagus. There was significant mucous build up in distal esophagus, without food impaction. The exam of the esophagus was otherwise normal. Bilroth-II anatomy noted. Diffuse moderately erythematous mucosa without bleeding was found in the entire examined remnant stomach. Some gastric ulcerations and edema noted throughout gastric remnant, especially near gastrojejunostomy anastomosis. The exam of the stomach was otherwise normal. While edematous, no evidence of stricturing at gastrojejunostomy anastomosis, and endoscope passed into efferent/afferent jejunal limbs without difficulty. The examined distal 5 cm and proximal 5 cm of afferent and efferent jejunal limbs were normal, respectively.   Echo 01/05/22: EF 60-65%,   CT abdomen / pelvis 01/03/22: IMPRESSION: 1. Mild dilatation of the right ureter with mucosal hyperenhancement,  as seen on recent CT 01/01/2022, suggestive of urinary tract infection. Recommend correlation with urinalysis. 2. Stable appearance of the urinary bladder with scattered locules of air, presumed to be related to prior instrumentation. 3. Bilateral nonobstructive nephrolithiasis. 4. Prostatomegaly. 5. Colonic diverticulosis without diverticulitis. 6.  Aortic  Atherosclerosis (ICD10-I70.0).   Colonoscopy 12/19/2013: Dr. Erlene Quan - 4 x 42m polyps removed cold forceps, diverticulosis, hemorrhoids    Past Medical History:  Diagnosis Date   Aortic atherosclerosis ([place]    Arthritis    B12 deficiency    Bladder calculi    CAD (coronary artery disease)    cardiologist--- dr jMartinique   abnormal nuclear stress test 11/ 2013;  s/p cardiac cath 10-21-2012 minimal nonobstructiive CAD w/ normal LVF   Diverticulosis    Dyslipidemia    Esophagitis    Full dentures    Gastroparesis    follow[place] by ADoran Stabl[place] ( [facility] [place] gast[place] for ulcers in 1970s   GERD (gastroesophageal reflux disease)    Heart murmur    Hiatal hernia    History of adenomatous polyp of colon    History of kidney stones    History of melanoma 2008   s/p left forearn wide local excision 06/ 2008   History of syncope 2010   cardiologist --- dr jMartinique  (11-25-2021 per pt no syncope or near syncope since)   Hypertension    Macular degeneration of both eyes    Nocturia associated with benign prostatic hyperplasia    OSA (obstructive sleep apnea)    no cpap   Polyneuropathy    PVC's (premature ventricular contractions) 2010   06/ 2010 per event monitor SR w/ occasional PVCs and 4 beat SVT (in epic)   Wears glasses      Past Surgical History:  Procedure Laterality Date   APPENDECTOMY     child   CARDIAC CATHETERIZATION  10/2012   minimal nonobstructive CAD with normal LV function   CATARACT EXTRACTION W/ INTRAOCULAR LENS IMPLANT Bilateral    2009 approx   CYSTOSCOPY WITH LITHOLAPAXY N/A 07/13/2016   Procedure: CYSTOSCOPY WITH LITHOLAPAXY;  Surgeon: SFranchot Gallo MD;  Location: [facility];  Service: Urology;  Laterality: N/A;   CYSTOSCOPY WITH LITHOLAPAXY N/A 12/01/2021   Procedure: CYSTOSCOPY WITH LITHOLAPAXY;  Surgeon: DFranchot Gallo MD;  [facility]  Service: Urology;  Laterality: N/A;    CYSTOSCOPY/RETROGRADE/URETEROSCOPY/STONE EXTRACTION WITH BASKET  02/24/2012   Procedure: CYSTOSCOPY/RETROGRADE/URETEROSCOPY/STONE EXTRACTION WITH BASKET;  Surgeon: SFranchot Gallo MD;  [facility]  Service: Urology;  Laterality: Bilateral;  1 HOUR   ESOPHAGOGASTRODUODENOSCOPY N/A 01/06/2022   Procedure: ESOPHAGOGASTRODUODENOSCOPY (EGD);  Surgeon: OArta Silence MD;  [facility];  Service: Gastroenterology;  Laterality: N/A;   EXTRACORPOREAL SHOCK WAVE LITHOTRIPSY  11/16/2011   '@[facility]'$    GASTRECTOMY     1970s  for ulcers   HEMORRHOID SURGERY     yrs ago   HOLMIUM LASER APPLICATION N/A 14/65/6812  Procedure: HOLMIUM LASER APPLICATION;  Surgeon: DFranchot Gallo MD;  [facility]  Service: Urology;  Laterality: N/A;   INGUINAL HERNIA REPAIR Left 07/13/2016   Procedure: REPAIR LEFT INGUINAL HERNIA;  Surgeon: TArmandina Gemma MD;  Location: [facility];  Service: General;  Laterality: Left;   INSERTION OF MESH Left 07/13/2016   Procedure: INSERTION OF MESH;  Surgeon: TArmandina Gemma MD;  Location: [facility];  Service: General;  Laterality: Left;   MELANOMA EXCISION Left 05/05/2007   '@[facility]'$  ;  LEFT FOREARM   TONSILLECTOMY     child   TOTAL HIP ARTHROPLASTY Left 02/11/2021   Procedure: TOTAL HIP ARTHROPLASTY;  Surgeon: Marchia Bond, MD;  Location: [facility];  Service: Orthopedics;  Laterality: Left;   TOTAL SHOULDER ARTHROPLASTY Right 12/29/2017   Procedure: TOTAL SHOULDER ARTHROPLASTY;  Surgeon: Hiram Gash, MD;  Location: [facility];  Service: Orthopedics;  Laterality: Right;   Family History  Problem Relation Age of Onset   Heart disease Mother        CABG in her 60s   Pneumonia Father    Cancer Sister        breast   Lung cancer Brother        smoker   Social History   Tobacco Use   Smoking status: Former    Packs/day: 0.10    Years: 4.00    Total pack years: 0.40    Types: Cigarettes    Quit date: 09/03/1956    Years since  quitting: 65.9   Smokeless tobacco: Never  Vaping Use   Vaping Use: Never used  Substance Use Topics   Alcohol use: No    Alcohol/week: 0.0 standard drinks of alcohol   Drug use: Never   Current Outpatient Medications  Medication Sig Dispense Refill   acetaminophen (TYLENOL) 500 MG tablet Take 500-1,000 mg by mouth 2 (two) times daily as needed for moderate pain or headache.     apixaban (ELIQUIS) 5 MG TABS tablet Take 1 tablet (5 mg total) by mouth 2 (two) times daily. 180 tablet 3   atorvastatin (LIPITOR) 20 MG tablet TAKE ONE TABLET BY MOUTH THREE TIMES A WEEK 39 tablet 3   bevacizumab (AVASTIN) 2.5 mg/0.1 mL SOLN 2.5 mg by Intravitreal route. Injection to eyes every 5 weeks - use with vigamox     Carboxymethylcellulose Sodium 1 % GEL Place 1 drop into both eyes at bedtime.     Cyanocobalamin (B-12 COMPLIANCE INJECTION IJ) Inject 1 Dose as directed every 30 (thirty) days.     Docusate Sodium (COLACE PO) Take by mouth.     losartan (COZAAR) 25 MG tablet Take 0.5 tablets (12.5 mg total) by mouth daily. If blood pressure over 145 when you check- ok to take other half tablet once per day (so total of '25mg'$  per day) 90 tablet 3   metoCLOPramide (REGLAN) 5 MG tablet Take 1 tablet (5 mg total) by mouth 2 (two) times daily. 60 tablet 1   metoprolol tartrate (LOPRESSOR) 25 MG tablet Take 12.5 mg by mouth 2 (two) times daily.     neomycin-polymyxin b-dexamethasone (MAXITROL) 3.5-10000-0.1 OINT      pantoprazole (PROTONIX) 40 MG tablet Take 1 tablet (40 mg total) by mouth 2 (two) times daily before a meal. 60 tablet 1   Propylene Glycol (SYSTANE BALANCE OP) Place 1 drop into both eyes daily.     tamsulosin (FLOMAX) 0.4 MG CAPS capsule Take 0.4 mg by mouth daily.     VIGAMOX 0.5 % ophthalmic solution Place 1 drop into both eyes as directed. Take every 5 weeks as directed - use with avastin, instill 1 drop into both eyes 4 times daily for 2 days after eye injections     polyethylene glycol (MIRALAX /  GLYCOLAX) 17 g packet Take 17 g by mouth daily as needed for moderate constipation. (Patient not taking: Reported on 07/17/2022)     No current facility-administered medications for this visit.   Allergies  Allergen Reactions   Aspirin Other (See Comments)  UPSET STOMACH   Percocet [Oxycodone-Acetaminophen] Nausea And Vomiting   Vitamins [Apatate] Other (See Comments)    Bothers stomach     Review of Systems: All systems reviewed and negative except where noted in HPI.   Lab Results  Component Value Date   WBC 6.0 01/14/2022   HGB 13.1 01/14/2022   HCT 38.4 (L) 01/14/2022   MCV 92.9 01/14/2022   PLT 185.0 01/14/2022    Lab Results  Component Value Date   CREATININE 0.65 01/14/2022   BUN 11 01/14/2022   NA 137 01/14/2022   K 4.1 01/14/2022   CL 100 01/14/2022   CO2 30 01/14/2022    Lab Results  Component Value Date   ALT 18 01/14/2022   AST 19 01/14/2022   ALKPHOS 63 01/14/2022   BILITOT 0.9 01/14/2022     Physical Exam: BP 130/64   Pulse 67   Ht '6\' 1"'$  (1.854 m)   Wt 176 lb 6 oz (80 kg)   BMI 23.27 kg/m  Constitutional: Pleasant,well-developed, male in no acute distress. HEENT: Normocephalic and atraumatic. Conjunctivae are normal. No scleral icterus. Neck supple.  Cardiovascular: Normal rate, regular rhythm.  Pulmonary/chest: Effort normal and breath sounds normal.  Abdominal: Soft, nondistended, nontender. There are no masses palpable.  Extremities: no edema Lymphadenopathy: No cervical adenopathy noted. Neurological: Alert and oriented to person place and time. Skin: Skin is warm and dry. No rashes noted. Psychiatric: Normal mood and affect. Behavior is normal.   ASSESSMENT: 87 y.o. male here for assessment of the following  1. Gastroesophageal reflux disease with esophagitis and hemorrhage   2. Gastroparesis   3. History of stomach ulcers   4. Long-term current use of proton pump inhibitor therapy   5. Anticoagulated    As above, history of  Billroth II anatomy with history of GERD and gastroparesis for many years, on chronic PPI and Reglan, admitted to the hospital in February with renal stones which led to infection.  During this time he had multiple episodes of vomiting with some coffee-ground emesis.  EGD done which showed this was likely due to LA grade D esophagitis from vomiting and gastritis.  Had his Protonix increased to twice daily dosing, given Carafate for period of time, he is done really well without any recurrent symptoms.  He is tolerating anticoagulation with a stable hemoglobin.  We discussed his history at length.  Under normal circumstances 1 would recommend a follow-up EGD to exclude underlying Barrett's esophagus and ensure mucosal healing after an endoscopy like his in February.  However, at his age and with his cardiovascular comorbidities, he is higher than average risk for anesthesia.  We discussed if he wanted to follow-up endoscopy or not.  I discussed risk benefits of endoscopy and anesthesia with him, I think that the risks likely outweigh the benefits for him given he is asymptomatic and doing so well on his present regimen.  He understands risks of not doing an endoscopy, he understands this, he is declining endoscopy at this point time unless he has symptoms that bother him which I think is reasonable.  He will continue twice daily Protonix and current dose of Reglan.  I discussed risks of long-term PPI with him, his renal function is normal, no history of reported osteoporosis or osteopenia.  Discussed risks of Reglan, has been on this for many years and tolerating it well, he wants to continue it as it works well for him.  Overall we will plan on monitoring for now.  No further surveillance colonoscopy planned due to his age.  I will see him at least once yearly if not sooner with any issues.  PLAN: - we had a good discussion as above regarding his history and whether or not to pursue any follow up endoscopy. At  this point in time he wishes to avoid any endoscopic procedures if at all possible which I think is reasonable - continue protonix - discussed risks - continue reglan - discussed risks - f/u one year or sooner with any issues.  Jolly Mango, MD [facility]  CC: Marin Olp, MD

## 2022-07-17 NOTE — Patient Instructions (Addendum)
_Please follow up with Dr Havery Moros in 1 year. ______________________________________________________  If you are age 85 or older, your body mass index should be between 23-30. Your Body mass index is 23.27 kg/m. If this is out of the aforementioned range listed, please consider follow up with your Primary Care Provider.  If you are age 26 or younger, your body mass index should be between 19-25. Your Body mass index is 23.27 kg/m. If this is out of the aformentioned range listed, please consider follow up with your Primary Care Provider.   ________________________________________________________  The [facility] providers would like to encourage you to use [facility] to communicate with providers for non-urgent requests or questions.  Due to long hold times on the telephone, sending your provider a message by [facility] may be a faster and more efficient way to get a response.  Please allow 48 business hours for a response.  Please remember that this is for non-urgent requests.   _______________________________________________________  Due to recent changes in healthcare laws, you may see the results of your imaging and laboratory studies on MyChart before your provider has had a chance to review them.  We understand that in some cases there may be results that are confusing or concerning to you. Not all laboratory results come back in the same time frame and the provider may be waiting for multiple results in order to interpret others.  Please give [place] 48 hours in order for your provider to thoroughly review all the results before contacting the office for clarification of your results.

## 2022-07-23 ENCOUNTER — Ambulatory Visit (INDEPENDENT_AMBULATORY_CARE_PROVIDER_SITE_OTHER): Payer: Medicare Other

## 2022-07-23 DIAGNOSIS — E538 Deficiency of other specified B group vitamins: Secondary | ICD-10-CM | POA: Diagnosis not present

## 2022-07-23 MED ORDER — CYANOCOBALAMIN 1000 MCG/ML IJ SOLN
1000.0000 ug | Freq: Once | INTRAMUSCULAR | Status: AC
  Administered 2022-07-23: 1000 ug via INTRAMUSCULAR

## 2022-07-23 NOTE — Progress Notes (Signed)
Pt presented to day with Vitamin B deficiency and coming in for monthly B-12 injection. Pt advised these seem to work well for him since unable to take vitamin supplements orally  with past stomach surgery. Administered Cyanocobalamin in left deltoid and pt tolerated well with no issues.

## 2022-07-31 ENCOUNTER — Ambulatory Visit: Payer: Medicare Other | Admitting: Cardiology

## 2022-08-04 ENCOUNTER — Ambulatory Visit: Payer: Medicare Other | Admitting: Family Medicine

## 2022-08-06 ENCOUNTER — Encounter (INDEPENDENT_AMBULATORY_CARE_PROVIDER_SITE_OTHER): Payer: Medicare Other | Admitting: Ophthalmology

## 2022-08-06 DIAGNOSIS — H35033 Hypertensive retinopathy, bilateral: Secondary | ICD-10-CM | POA: Diagnosis not present

## 2022-08-06 DIAGNOSIS — D3132 Benign neoplasm of left choroid: Secondary | ICD-10-CM | POA: Diagnosis not present

## 2022-08-06 DIAGNOSIS — H43813 Vitreous degeneration, bilateral: Secondary | ICD-10-CM

## 2022-08-06 DIAGNOSIS — H353231 Exudative age-related macular degeneration, bilateral, with active choroidal neovascularization: Secondary | ICD-10-CM

## 2022-08-06 DIAGNOSIS — I10 Essential (primary) hypertension: Secondary | ICD-10-CM | POA: Diagnosis not present

## 2022-08-06 DIAGNOSIS — H33301 Unspecified retinal break, right eye: Secondary | ICD-10-CM

## 2022-08-11 ENCOUNTER — Other Ambulatory Visit: Payer: Self-pay | Admitting: Gastroenterology

## 2022-08-12 DIAGNOSIS — N210 Calculus in bladder: Secondary | ICD-10-CM | POA: Diagnosis not present

## 2022-08-12 DIAGNOSIS — N401 Enlarged prostate with lower urinary tract symptoms: Secondary | ICD-10-CM | POA: Diagnosis not present

## 2022-08-12 DIAGNOSIS — R3914 Feeling of incomplete bladder emptying: Secondary | ICD-10-CM | POA: Diagnosis not present

## 2022-08-13 ENCOUNTER — Ambulatory Visit (INDEPENDENT_AMBULATORY_CARE_PROVIDER_SITE_OTHER): Payer: Medicare Other | Admitting: Family Medicine

## 2022-08-13 ENCOUNTER — Encounter: Payer: Self-pay | Admitting: Family Medicine

## 2022-08-13 VITALS — BP 126/66 | HR 71 | Temp 98.0°F | Ht 73.0 in | Wt 178.6 lb

## 2022-08-13 DIAGNOSIS — Z23 Encounter for immunization: Secondary | ICD-10-CM | POA: Diagnosis not present

## 2022-08-13 DIAGNOSIS — I10 Essential (primary) hypertension: Secondary | ICD-10-CM | POA: Diagnosis not present

## 2022-08-13 DIAGNOSIS — H6123 Impacted cerumen, bilateral: Secondary | ICD-10-CM | POA: Diagnosis not present

## 2022-08-13 DIAGNOSIS — I2510 Atherosclerotic heart disease of native coronary artery without angina pectoris: Secondary | ICD-10-CM

## 2022-08-13 DIAGNOSIS — H6122 Impacted cerumen, left ear: Secondary | ICD-10-CM

## 2022-08-13 NOTE — Progress Notes (Signed)
Phone [phone] In person visit   Subjective:   Joseph Simpson is a 87 y.o. year old very pleasant male patient who presents for/with See problem oriented charting Chief Complaint  Patient presents with   Cerumen Impaction    Pt c/o cerumen impaction-can't get in with ENT, both ears   Past Medical History-  Patient Active Problem List   Diagnosis Date Noted   CAD (coronary artery disease)     Priority: High   Macular degeneration 11/17/2011    Priority: High   Hyperglycemia 12/21/2019    Priority: Medium    B12 deficiency 03/09/2019    Priority: Medium    Hyperlipidemia 06/10/2016    Priority: Medium    BPH (benign prostatic hyperplasia) 11/23/2014    Priority: Medium    Palpitations 07/31/2014    Priority: Medium    Dyspnea 02/27/2014    Priority: Medium    Essential hypertension 09/23/2007    Priority: Medium    GERD 09/23/2007    Priority: Medium    Osteoarthritis of left hip 01/30/2019    Priority: Low   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 12/29/2017    Priority: Low   PAC (premature atrial contraction) 10/25/2015    Priority: Low   Squamous cell carcinoma in situ 09/09/2015    Priority: Low   Cough 05/10/2014    Priority: Low   Pulmonary nodule 05/10/2014    Priority: Low   Murmur 02/10/2012    Priority: Low   Benign neoplasm of skin of lower limb 03/24/2022   Lentigo 03/24/2022   Melanocytic nevi of trunk 03/24/2022   Melanocytic nevus of face 03/24/2022   Other seborrheic keratosis 03/24/2022   Personal history of other specified conditions 03/24/2022   Physical deconditioning 01/08/2022   GI bleed 01/07/2022   UTI (urinary tract infection) 01/07/2022   Protein-calorie malnutrition, moderate ([place]) 01/07/2022   Left wrist pain 10/01/2021   Bilateral foot pain 07/04/2021   S/P hip replacement, left 02/11/2021   Syncope    New onset a-fib ([place])    Gastroparesis 02/15/2017   Hx of adenomatous colonic polyps 02/15/2017    Left inguinal hernia 07/12/2016    Medications- reviewed and updated Current Outpatient Medications  Medication Sig Dispense Refill   acetaminophen (TYLENOL) 500 MG tablet Take 500-1,000 mg by mouth 2 (two) times daily as needed for moderate pain or headache.     apixaban (ELIQUIS) 5 MG TABS tablet Take 1 tablet (5 mg total) by mouth 2 (two) times daily. 180 tablet 3   atorvastatin (LIPITOR) 20 MG tablet TAKE ONE TABLET BY MOUTH THREE TIMES A WEEK 39 tablet 3   bevacizumab (AVASTIN) 2.5 mg/0.1 mL SOLN 2.5 mg by Intravitreal route. Injection to eyes every 5 weeks - use with vigamox     Carboxymethylcellulose Sodium 1 % GEL Place 1 drop into both eyes at bedtime.     Cyanocobalamin (B-12 COMPLIANCE INJECTION IJ) Inject 1 Dose as directed every 30 (thirty) days.     Docusate Sodium (COLACE PO) Take by mouth.     losartan (COZAAR) 25 MG tablet Take 0.5 tablets (12.5 mg total) by mouth daily. If blood pressure over 145 when you check- ok to take other half tablet once per day (so total of '25mg'$  per day) 90 tablet 3   metoCLOPramide (REGLAN) 5 MG tablet TAKE 1 TABLET BY MOUTH TWICE A DAY 60 tablet 5   metoprolol tartrate (LOPRESSOR) 25 MG tablet Take 12.5 mg by mouth 2 (two) times  daily.     neomycin-polymyxin b-dexamethasone (MAXITROL) 3.5-10000-0.1 OINT      pantoprazole (PROTONIX) 40 MG tablet Take 1 tablet (40 mg total) by mouth 2 (two) times daily before a meal. 60 tablet 1   polyethylene glycol (MIRALAX / GLYCOLAX) 17 g packet Take 17 g by mouth daily as needed for moderate constipation.     Propylene Glycol (SYSTANE BALANCE OP) Place 1 drop into both eyes daily.     tamsulosin (FLOMAX) 0.4 MG CAPS capsule Take 0.4 mg by mouth daily.     VIGAMOX 0.5 % ophthalmic solution Place 1 drop into both eyes as directed. Take every 5 weeks as directed - use with avastin, instill 1 drop into both eyes 4 times daily for 2 days after eye injections     No current facility-administered medications for this  visit.     Objective:  BP 126/66   Pulse 71   Temp 98 F (36.7 C)   Ht '6\' 1"'$  (1.854 m)   Wt 178 lb 9.6 oz (81 kg)   SpO2 95%   BMI 23.56 kg/m  Gen: NAD, resting comfortably Bilateral cerumen impaction.  Able to visualize full tympanic membrane which appears normal on the right after irrigation by my team. On the right there is extensive cerumen even after attempted irrigation by my team  Procedure note: Verbal consent obtained Obstructive copious Cerumen noted in left ear as above in exam. This obscures evaluation of tympanic membrane.  I used a wax curette with magnification to remove portions of cerumen from ear. Also required multiple rounds of irrigation between curette attempts due to cerumen being near Tympanic membrane- able to get some of this to migrate far enough in canal to remove with curette Nearly Full view of tympanic membrane after procedure with only 25% covered (upper portion of ear canal and not in close proximity to drum but unable to angle curette into this position to remove) . Patient tolerated procedure well     Assessment and Plan   # cerumen impaction concern S:was trying to get hearing aids fitted and was told not able to due to wax in both ears- about 2 weeks ago. Went in due to worsening hearing. Unfortunately they were unable to fit him that day and told needed ENT visit- but could not get in with them until december.  Does have hearing loss which was reason that led him to see audiology A/P: Today we did note bilateral cerumen impactions and patient reported needed to be removed for him to be able to be fitted for hearing aids (had limited timeframe). Able to completely remove wax in the right ear with irrigation alone. On the left able to remove large quantity and had about a 75% view of the left tympanic membrane after several rounds of irrigation by nursing (stopped due to dizziness) and then primarily curetting with use of irrigation by me to try to get  some movement of deeper wax. We recommended for the residual trying mineral oil to further loosen and help this drain (see after visit summary). If he fails to be able to be fitted for updated hearing aids- we can certain try in office procedure again  - If they cannot fit due to wax we can certainly retrial irrigation and curette combo - can schedule with ENT if we are not able to get with 2nd round of treatment (if needed)  #Hypertension  S: Controlled on losartan 12.5 mg daily, metoprolol 12.5 mg twice daily (also  helps palpitations- reduced from full tablet due to bradycardia 06/2021) Monitors his home blood pressures-Bp up to 165 and took additional half tablet of losartan as instructed in last few days- usually low 140s  BP Readings from Last 3 Encounters:  08/13/22 126/66  07/17/22 130/64  07/13/22 120/62  A/P: Blood pressure well controlled in office-has had some elevations at home but trends back down after second dose of losartan.  Still mostly in the 140s which is above ideal goal but we want to avoid higher doses of medicine due to fall risk and prior orthostatic issues  Recommended follow up: Return for as needed for new, worsening, persistent symptoms. Future Appointments  Date Time Provider [place]  08/20/2022  9:45 AM [facility] NURSE [facility]  09/03/2022  8:15 AM Hayden Pedro, MD [facility] None  09/17/2022  8:45 AM [facility] HEALTH COACH [facility]  09/21/2022  9:20 AM Marin Olp, MD [facility]  09/28/2022  2:00 PM [place], Peter M, MD [facility] None  09/28/2022  3:00 PM [facility]CM PHARMACIST [facility]   Lab/Order associations:   ICD-10-CM   1. Essential hypertension  I10     2. Hearing loss due to cerumen impaction, left  H61.22     3. Need for immunization against influenza  Z23 Flu Vaccine QUAD High Dose(Fluad)     Return precautions advised.  Garret Reddish, MD

## 2022-08-13 NOTE — Patient Instructions (Addendum)
Health Maintenance Due  Topic Date Due   COVID-19 Vaccine (5 - Mixed Product risk series) 12/23/2021  Let us know when you get the newest covid shot  Mineral oil for ear full of wax Purchase mineral oil from laxative aisle Lay down on your side with ear that is bothering you facing up Use 3-4 drops with a dropper and place in ear for 30 seconds Place cotton swab outside of ear Turn to other side and allow this to drain Repeat 3-4 x a day Return to see [place] if not improving within a few days  If they cannot fit due to wax we can certainly retrial irrigation and curette combo  - can schedule with ENT if we are not able to get with 2nd round of treatment (if needed)  Recommended follow up: Return for as needed for new, worsening, persistent symptoms.

## 2022-08-20 ENCOUNTER — Ambulatory Visit (INDEPENDENT_AMBULATORY_CARE_PROVIDER_SITE_OTHER): Payer: Medicare Other

## 2022-08-20 DIAGNOSIS — E538 Deficiency of other specified B group vitamins: Secondary | ICD-10-CM | POA: Diagnosis not present

## 2022-08-20 MED ORDER — CYANOCOBALAMIN 1000 MCG/ML IJ SOLN
1000.0000 ug | Freq: Once | INTRAMUSCULAR | Status: AC
  Administered 2022-08-20: 1000 ug via INTRAMUSCULAR

## 2022-08-20 NOTE — Progress Notes (Addendum)
Pt received b12 injection in the rt deltoid, pt tolerated injection well.

## 2022-09-02 DIAGNOSIS — Z23 Encounter for immunization: Secondary | ICD-10-CM | POA: Diagnosis not present

## 2022-09-03 ENCOUNTER — Encounter (INDEPENDENT_AMBULATORY_CARE_PROVIDER_SITE_OTHER): Payer: Medicare Other | Admitting: Ophthalmology

## 2022-09-03 DIAGNOSIS — H43813 Vitreous degeneration, bilateral: Secondary | ICD-10-CM | POA: Diagnosis not present

## 2022-09-03 DIAGNOSIS — H33301 Unspecified retinal break, right eye: Secondary | ICD-10-CM

## 2022-09-03 DIAGNOSIS — H353231 Exudative age-related macular degeneration, bilateral, with active choroidal neovascularization: Secondary | ICD-10-CM | POA: Diagnosis not present

## 2022-09-03 DIAGNOSIS — I10 Essential (primary) hypertension: Secondary | ICD-10-CM | POA: Diagnosis not present

## 2022-09-03 DIAGNOSIS — D3132 Benign neoplasm of left choroid: Secondary | ICD-10-CM

## 2022-09-03 DIAGNOSIS — H35033 Hypertensive retinopathy, bilateral: Secondary | ICD-10-CM

## 2022-09-06 ENCOUNTER — Other Ambulatory Visit: Payer: Self-pay | Admitting: Gastroenterology

## 2022-09-17 ENCOUNTER — Ambulatory Visit (INDEPENDENT_AMBULATORY_CARE_PROVIDER_SITE_OTHER): Payer: Medicare Other

## 2022-09-17 VITALS — Wt 178.0 lb

## 2022-09-17 DIAGNOSIS — E538 Deficiency of other specified B group vitamins: Secondary | ICD-10-CM | POA: Diagnosis not present

## 2022-09-17 DIAGNOSIS — Z0000 Encounter for general adult medical examination without abnormal findings: Secondary | ICD-10-CM

## 2022-09-17 MED ORDER — CYANOCOBALAMIN 1000 MCG/ML IJ SOLN
1000.0000 ug | Freq: Once | INTRAMUSCULAR | Status: AC
  Administered 2022-09-17: 1000 ug via INTRAMUSCULAR

## 2022-09-17 NOTE — Progress Notes (Signed)
I connected with  Joseph Simpson on 09/17/22 by a audio enabled telemedicine application and verified that I am speaking with the correct person using two identifiers.  Patient Location: Home  Provider Location: Office/Clinic  I discussed the limitations of evaluation and management by telemedicine. The patient expressed understanding and agreed to proceed.   Subjective:   Joseph Simpson is a 87 y.o. male who presents for Medicare Annual/Subsequent preventive examination.  Review of Systems     Cardiac Risk Factors include: advanced age (>66mn, >>32women);hypertension;dyslipidemia;male gender     Objective:    Today's Vitals   09/17/22 0848  Weight: 178 lb (80.7 kg)   Body mass index is 23.48 kg/m.     09/17/2022    8:54 AM 01/04/2022    9:00 AM 01/03/2022    9:00 PM 12/03/2021    6:59 AM 12/01/2021    7:38 AM 09/04/2021    8:53 AM 02/11/2021    7:50 AM  Advanced Directives  Does Patient Have a Medical Advance Directive? No  Yes No No Yes Yes  Type of ASocial research officer, governmentLiving will    Healthcare Power of AFelicityLiving will  Does patient want to make changes to medical advance directive?  No - Patient declined No - Patient declined    No - Patient declined  Copy of HPickrellin Chart?  No - copy requested    No - copy requested No - copy requested  Would patient like information on creating a medical advance directive? No - Patient declined   No - Patient declined No - Patient declined      Current Medications (verified) Outpatient Encounter Medications as of 09/17/2022  Medication Sig   acetaminophen (TYLENOL) 500 MG tablet Take 500-1,000 mg by mouth 2 (two) times daily as needed for moderate pain or headache.   apixaban (ELIQUIS) 5 MG TABS tablet Take 1 tablet (5 mg total) by mouth 2 (two) times daily.   atorvastatin (LIPITOR) 20 MG tablet TAKE ONE TABLET BY MOUTH THREE TIMES A WEEK    bevacizumab (AVASTIN) 2.5 mg/0.1 mL SOLN 2.5 mg by Intravitreal route. Injection to eyes every 5 weeks - use with vigamox   Carboxymethylcellulose Sodium 1 % GEL Place 1 drop into both eyes at bedtime.   Cyanocobalamin (B-12 COMPLIANCE INJECTION IJ) Inject 1 Dose as directed every 30 (thirty) days.   Docusate Sodium (COLACE PO) Take by mouth.   losartan (COZAAR) 25 MG tablet Take 0.5 tablets (12.5 mg total) by mouth daily. If blood pressure over 145 when you check- ok to take other half tablet once per day (so total of '25mg'$  per day)   metoCLOPramide (REGLAN) 5 MG tablet TAKE 1 TABLET BY MOUTH TWICE A DAY   metoprolol tartrate (LOPRESSOR) 25 MG tablet Take 12.5 mg by mouth 2 (two) times daily.   neomycin-polymyxin b-dexamethasone (MAXITROL) 3.5-10000-0.1 OINT    pantoprazole (PROTONIX) 40 MG tablet TAKE 1 TABLET BY MOUTH TWICE A DAY   polyethylene glycol (MIRALAX / GLYCOLAX) 17 g packet Take 17 g by mouth daily as needed for moderate constipation.   Propylene Glycol (SYSTANE BALANCE OP) Place 1 drop into both eyes daily.   tamsulosin (FLOMAX) 0.4 MG CAPS capsule Take 0.4 mg by mouth daily.   VIGAMOX 0.5 % ophthalmic solution Place 1 drop into both eyes as directed. Take every 5 weeks as directed - use with avastin, instill 1 drop into both eyes 4  times daily for 2 days after eye injections   No facility-administered encounter medications on file as of 09/17/2022.    Allergies (verified) Aspirin, Percocet [oxycodone-acetaminophen], and Vitamins [apatate]   History: Past Medical History:  Diagnosis Date   Aortic atherosclerosis ([place])    Arthritis    B12 deficiency    Bladder calculi    CAD (coronary artery disease)    cardiologist--- dr Martinique;   abnormal nuclear stress test 11/ 2013;  s/p cardiac cath 10-21-2012 minimal nonobstructiive CAD w/ normal LVF   Diverticulosis    Dyslipidemia    Esophagitis    Full dentures    Gastroparesis    followed by [name] PA ( [facility] in  [place]) s/p gastrectomy for ulcers in 1970s   GERD (gastroesophageal reflux disease)    Heart murmur    Hiatal hernia    History of adenomatous polyp of colon    History of kidney stones    History of melanoma 2008   s/p left forearn wide local excision 06/ 2008   History of syncope 2010   cardiologist --- dr Martinique;  (11-25-2021 per pt no syncope or near syncope since)   Hypertension    Macular degeneration of both eyes    Nocturia associated with benign prostatic hyperplasia    OSA (obstructive sleep apnea)    no cpap   Polyneuropathy    PVC's (premature ventricular contractions) 2010   06/ 2010 per event monitor SR w/ occasional PVCs and 4 beat SVT (in epic)   Wears glasses    Past Surgical History:  Procedure Laterality Date   APPENDECTOMY     child   CARDIAC CATHETERIZATION  10/2012   minimal nonobstructive CAD with normal LV function   CATARACT EXTRACTION W/ INTRAOCULAR LENS IMPLANT Bilateral    2009 approx   CYSTOSCOPY WITH LITHOLAPAXY N/A 07/13/2016   Procedure: CYSTOSCOPY WITH LITHOLAPAXY;  Surgeon: Franchot Gallo, MD;  Location: [facility];  Service: Urology;  Laterality: N/A;   CYSTOSCOPY WITH LITHOLAPAXY N/A 12/01/2021   Procedure: CYSTOSCOPY WITH LITHOLAPAXY;  Surgeon: Franchot Gallo, MD;  Location: [facility];  Service: Urology;  Laterality: N/A;   CYSTOSCOPY/RETROGRADE/URETEROSCOPY/STONE EXTRACTION WITH BASKET  02/24/2012   Procedure: CYSTOSCOPY/RETROGRADE/URETEROSCOPY/STONE EXTRACTION WITH BASKET;  Surgeon: Franchot Gallo, MD;  Location: [facility];  Service: Urology;  Laterality: Bilateral;  1 HOUR   ESOPHAGOGASTRODUODENOSCOPY N/A 01/06/2022   Procedure: ESOPHAGOGASTRODUODENOSCOPY (EGD);  Surgeon: Arta Silence, MD;  Location: [facility];  Service: Gastroenterology;  Laterality: N/A;   EXTRACORPOREAL SHOCK WAVE LITHOTRIPSY  11/16/2011   '@[facility]'$    GASTRECTOMY     1970s  for ulcers   HEMORRHOID SURGERY     yrs ago    HOLMIUM LASER APPLICATION N/A 6/54/6503   Procedure: HOLMIUM LASER APPLICATION;  Surgeon: Franchot Gallo, MD;  Location: [facility];  Service: Urology;  Laterality: N/A;   INGUINAL HERNIA REPAIR Left 07/13/2016   Procedure: REPAIR LEFT INGUINAL HERNIA;  Surgeon: Armandina Gemma, MD;  Location: [facility];  Service: General;  Laterality: Left;   INSERTION OF MESH Left 07/13/2016   Procedure: INSERTION OF MESH;  Surgeon: Armandina Gemma, MD;  Location: [facility];  Service: General;  Laterality: Left;   MELANOMA EXCISION Left 05/05/2007   '@[facility]'$  ;   LEFT FOREARM   TONSILLECTOMY     child   TOTAL HIP ARTHROPLASTY Left 02/11/2021   Procedure: TOTAL HIP ARTHROPLASTY;  Surgeon: Marchia Bond, MD;  Location: [facility];  Service: Orthopedics;  Laterality: Left;  TOTAL SHOULDER ARTHROPLASTY Right 12/29/2017   Procedure: TOTAL SHOULDER ARTHROPLASTY;  Surgeon: Hiram Gash, MD;  Location: [facility];  Service: Orthopedics;  Laterality: Right;   Family History  Problem Relation Age of Onset   Heart disease Mother        CABG in her 43s   Pneumonia Father    Cancer Sister        breast   Lung cancer Brother        smoker   Social History   Socioeconomic History   Marital status: Widowed    Spouse name: Not on file   Number of children: 1   Years of education: Not on file   Highest education level: Not on file  Occupational History   Occupation: retired  Tobacco Use   Smoking status: Former    Packs/day: 0.10    Years: 4.00    Total pack years: 0.40    Types: Cigarettes    Quit date: 09/03/1956    Years since quitting: 66.0   Smokeless tobacco: Never  Vaping Use   Vaping Use: Never used  Substance and Sexual Activity   Alcohol use: No    Alcohol/week: 0.0 standard drinks of alcohol   Drug use: Never   Sexual activity: Yes  Other Topics Concern   Not on file  Social History Narrative   Widowed (lost wife 2015 from lung cancer never smoker), 1 son, 2 grandkids, 1 greatgrandkid  (born on Christmas)      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       Hobbies: ride motorcycle (slingshot 3 year), rabbit and dear hunt   Social Determinants of Health   Financial Resource Strain: Low Risk  (09/17/2022)   Overall Financial Resource Strain (CARDIA)    Difficulty of Paying Living Expenses: Not hard at all  Food Insecurity: No Food Insecurity (09/17/2022)   Hunger Vital Sign    Worried About Running Out of Food in the Last Year: Never true    Ran Out of Food in the Last Year: Never true  Transportation Needs: No Transportation Needs (09/17/2022)   PRAPARE - Hydrologist (Medical): No    Lack of Transportation (Non-Medical): No  Physical Activity: Sufficiently Active (09/17/2022)   Exercise Vital Sign    Days of Exercise per Week: 5 days    Minutes of Exercise per Session: 30 min  Stress: No Stress Concern Present (09/17/2022)   [place]    Feeling of Stress : Not at all  Social Connections: Moderately Isolated (09/17/2022)   Social Connection and Isolation Panel [NHANES]    Frequency of Communication with Friends and Family: More than three times a week    Frequency of Social Gatherings with Friends and Family: More than three times a week    Attends Religious Services: More than 4 times per year    Active Member of [employer] or Organizations: No    Attends Archivist Meetings: Never    Marital Status: Widowed    Tobacco Counseling Counseling given: Not Answered   Clinical Intake:  Pre-visit preparation completed: Yes  Pain : No/denies pain     BMI - recorded: 23.48 Nutritional Status: BMI of 19-24  Normal Nutritional Risks: None Diabetes: No  How often do you need to have someone help you when you read instructions, pamphlets, or other written materials from your doctor or pharmacy?: 1 - Never  Diabetic?no  Interpreter Needed?:  No  Information entered by :: Charlott Rakes, LPN   Activities of Daily Living    09/17/2022    8:55 AM 01/03/2022    7:00 PM  In your present state of health, do you have any difficulty performing the following activities:  Hearing? 0 0  Vision? 0 1  Difficulty concentrating or making decisions? 0 0  Walking or climbing stairs? 0 1  Dressing or bathing? 0 0  Doing errands, shopping? 0 1  Preparing Food and eating ? N   Using the Toilet? N   In the past six months, have you accidently leaked urine? N   Do you have problems with loss of bowel control? N   Managing your Medications? N   Managing your Finances? N   Housekeeping or managing your Housekeeping? N     Patient Care Team: Marin Olp, MD as PCP - General (Family Medicine) Martinique, Peter M, MD as PCP - Cardiology (Cardiology) Martinique, Peter M, MD as Consulting Physician (Cardiology) Almedia Balls, MD as Referring Physician (Orthopedic Surgery) Breck Coons, MD as Consulting Physician (Internal Medicine) Jari Pigg, MD as Consulting Physician (Dermatology) Franchot Gallo, MD as Consulting Physician (Urology) Hayden Pedro, MD as Consulting Physician (Ophthalmology) Edythe Clarity, [facility] (Pharmacist)  Indicate any recent Medical Services you may have received from other than [facility] providers in the past year (date may be approximate).     Assessment:   This is a routine wellness examination for [employer].  Hearing/Vision screen Hearing Screening - Comments:: Pt wears a hearing aids  Vision Screening - Comments:: Pt follows up with Dr Eliseo Squires for annul eye exams   Dietary issues and exercise activities discussed: Current Exercise Habits: Home exercise routine, Type of exercise: walking;stretching, Time (Minutes): 30, Frequency (Times/Week): 5, Weekly Exercise (Minutes/Week): 150   Goals Addressed             This Visit's Progress    Patient Stated       Stay active and healthy        Depression  Screen    09/17/2022    8:52 AM 09/04/2021    8:52 AM 07/30/2021   11:17 AM 06/27/2021    9:34 AM 12/25/2020    9:29 AM 08/29/2020    9:02 AM 12/21/2019    9:30 AM  PHQ 2/9 Scores  PHQ - 2 Score 0 0 0 0 0 0 0    Fall Risk    09/17/2022    8:54 AM 08/13/2022    9:51 AM 01/14/2022    8:06 AM 09/04/2021    8:54 AM 07/30/2021   11:17 AM  Fall Risk   Falls in the past year? 1 0 0 0 0  Number falls in past yr: 1 0 0 0   Injury with Fall? 1 0 0 0   Comment hurt right knee      Risk for fall due to : Impaired vision;History of fall(s) History of fall(s) Other (Comment) Impaired vision   Risk for fall due to: Comment   uses a walker    Follow up Falls prevention discussed Falls evaluation completed Falls prevention discussed Falls prevention discussed     FALL RISK PREVENTION PERTAINING TO THE HOME:  Any stairs in or around the home? Yes  If so, are there any without handrails? No  Home free of loose throw rugs in walkways, pet beds, electrical cords, etc? Yes  Adequate lighting in your home to reduce  risk of falls? Yes   ASSISTIVE DEVICES UTILIZED TO PREVENT FALLS:  Life alert? No  Use of a cane, walker or w/c? Yes  Grab bars in the bathroom? Yes  Shower chair or bench in shower? Yes  Elevated toilet seat or a handicapped toilet? No   TIMED UP AND GO:  Was the test performed? No .   Cognitive Function:        09/17/2022    8:56 AM 09/04/2021    8:55 AM 08/29/2020    9:08 AM 07/28/2019   10:01 AM  6CIT Screen  What Year? 0 points 0 points 0 points 0 points  What month? 0 points 0 points 0 points 0 points  What time? 0 points 0 points  0 points  Count back from 20 0 points 0 points 0 points 0 points  Months in reverse 0 points 0 points 0 points 0 points  Repeat phrase 0 points 0 points 0 points 0 points  Total Score 0 points 0 points  0 points    Immunizations Immunization History  Administered Date(s) Administered   Fluad Quad(high Dose 65+) 07/28/2019, 08/13/2020,  07/30/2021, 08/13/2022   Influenza Split 09/04/2011, 08/19/2012   Influenza Whole 08/31/2007, 09/16/2008, 10/07/2009, 10/01/2010   Influenza, High Dose Seasonal PF 08/25/2013, 09/24/2015, 08/27/2016, 09/01/2017, 08/23/2018   Influenza,inj,Quad PF,6+ Mos 09/05/2014   Moderna Sars-Covid-2 Vaccination 10/04/2020   PFIZER(Purple Top)SARS-COV-2 Vaccination 12/25/2019, 01/15/2020   PNEUMOCOCCAL CONJUGATE-20 07/13/2022   Pfizer Covid-19 Vaccine Bivalent Booster 47yr & up 10/28/2021   Pneumococcal Conjugate-13 11/23/2014   Pneumococcal Polysaccharide-23 10/16/2002   Td 03/04/1998, 10/09/2008   Tdap 07/07/2019   Zoster Recombinat (Shingrix) 05/17/2018, 07/19/2018   Zoster, Live 04/28/2011    TDAP status: Up to date  Flu Vaccine status: Up to date  Pneumococcal vaccine status: Up to date  Covid-19 vaccine status: Completed vaccines  Qualifies for Shingles Vaccine? Yes   Zostavax completed Yes   Shingrix Completed?: Yes  Screening Tests Health Maintenance  Topic Date Due   COVID-19 Vaccine (5 - Mixed Product risk series) 12/23/2021   Medicare Annual Wellness (AWV)  09/18/2023   TETANUS/TDAP  07/06/2029   Pneumonia Vaccine 87 Years old  Completed   INFLUENZA VACCINE  Completed   Zoster Vaccines- Shingrix  Completed   HPV VACCINES  Aged Out    Health Maintenance  Health Maintenance Due  Topic Date Due   COVID-19 Vaccine (5 - Mixed Product risk series) 12/23/2021    Colorectal cancer screening: No longer required.    Additional Screening:   Vision Screening: Recommended annual ophthalmology exams for early detection of glaucoma and other disorders of the eye. Is the patient up to date with their annual eye exam?  Yes  Who is the provider or what is the name of the office in which the patient attends annual eye exams? Dr VEliseo Squires If pt is not established with a provider, would they like to be referred to a provider to establish care? No .   Dental Screening: Recommended  annual dental exams for proper oral hygiene  Community Resource Referral / Chronic Care Management: CRR required this visit?  No   CCM required this visit?  No      Plan:     I have personally reviewed and noted the following in the patient's chart:   Medical and social history Use of alcohol, tobacco or illicit drugs  Current medications and supplements including opioid prescriptions. Patient is not currently taking opioid prescriptions. Functional ability and  status Nutritional status Physical activity Advanced directives List of other physicians Hospitalizations, surgeries, and ER visits in previous 12 months Vitals Screenings to include cognitive, depression, and falls Referrals and appointments  In addition, I have reviewed and discussed with patient certain preventive protocols, quality metrics, and best practice recommendations. A written personalized care plan for preventive services as well as general preventive health recommendations were provided to patient.     Willette Brace, LPN   11/21/6151   Nurse Notes: none

## 2022-09-17 NOTE — Patient Instructions (Signed)
Joseph Simpson , Thank you for taking time to come for your Medicare Wellness Visit. I appreciate your ongoing commitment to your health goals. Please review the following plan we discussed and let me know if I can assist you in the future.   These are the goals we discussed:  Goals      Maintain current health status.      Patient Stated     Maintain your health !  Travel when you can      Patient Stated     Stay healthy     Patient Stated     None at this time      Patient Stated     Stay active and healthy      Track and Manage My Blood Pressure-Hypertension     Timeframe:  Long-Range Goal Priority:  High Start Date:     09/02/21                        Expected End Date:  04/18/ 23                    Follow Up Date 12/03/21    - check blood pressure 3 times per week - choose a place to take my blood pressure (home, clinic or office, retail store) - write blood pressure results in a log or diary    Why is this important?   You won't feel high blood pressure, but it can still hurt your blood vessels.  High blood pressure can cause heart or kidney problems. It can also cause a stroke.  Making lifestyle changes like losing a little weight or eating less salt will help.  Checking your blood pressure at home and at different times of the day can help to control blood pressure.  If the doctor prescribes medicine remember to take it the way the doctor ordered.  Call the office if you cannot afford the medicine or if there are questions about it.     Notes:         This is a list of the screening recommended for you and due dates:  Health Maintenance  Topic Date Due   COVID-19 Vaccine (5 - Mixed Product risk series) 12/23/2021   Medicare Annual Wellness Visit  09/18/2023   Tetanus Vaccine  07/06/2029   Pneumonia Vaccine  Completed   Flu Shot  Completed   Zoster (Shingles) Vaccine  Completed   HPV Vaccine  Aged Out    Advanced directives: Advance directive discussed with  you today. Even though you declined this today please call our office should you change your mind and we can give you the proper paperwork for you to fill out.  Conditions/risks identified: stay healthy and active   Next appointment: Follow up in one year for your annual wellness visit.   Preventive Care 85 Years and Older, Male  Preventive care refers to lifestyle choices and visits with your health care provider that can promote health and wellness. What does preventive care include? A yearly physical exam. This is also called an annual well check. Dental exams once or twice a year. Routine eye exams. Ask your health care provider how often you should have your eyes checked. Personal lifestyle choices, including: Daily care of your teeth and gums. Regular physical activity. Eating a healthy diet. Avoiding tobacco and drug use. Limiting alcohol use. Practicing safe sex. Taking low doses of aspirin every day. Taking vitamin and  mineral supplements as recommended by your health care provider. What happens during an annual well check? The services and screenings done by your health care provider during your annual well check will depend on your age, overall health, lifestyle risk factors, and family history of disease. Counseling  Your health care provider may ask you questions about your: Alcohol use. Tobacco use. Drug use. Emotional well-being. Home and relationship well-being. Sexual activity. Eating habits. History of falls. Memory and ability to understand (cognition). Work and work Statistician. Screening  You may have the following tests or measurements: Height, weight, and BMI. Blood pressure. Lipid and cholesterol levels. These may be checked every 5 years, or more frequently if you are over 85 years old. Skin check. Lung cancer screening. You may have this screening every year starting at age 85 if you have a 30-pack-year history of smoking and currently smoke or have  quit within the past 15 years. Fecal occult blood test (FOBT) of the stool. You may have this test every year starting at age 85. Flexible sigmoidoscopy or colonoscopy. You may have a sigmoidoscopy every 5 years or a colonoscopy every 10 years starting at age 85. Prostate cancer screening. Recommendations will vary depending on your family history and other risks. Hepatitis C blood test. Hepatitis B blood test. Sexually transmitted disease (STD) testing. Diabetes screening. This is done by checking your blood sugar (glucose) after you have not eaten for a while (fasting). You may have this done every 1-3 years. Abdominal aortic aneurysm (AAA) screening. You may need this if you are a current or former smoker. Osteoporosis. You may be screened starting at age 85 if you are at high risk. Talk with your health care provider about your test results, treatment options, and if necessary, the need for more tests. Vaccines  Your health care provider may recommend certain vaccines, such as: Influenza vaccine. This is recommended every year. Tetanus, diphtheria, and acellular pertussis (Tdap, Td) vaccine. You may need a Td booster every 10 years. Zoster vaccine. You may need this after age 85. Pneumococcal 13-valent conjugate (PCV13) vaccine. One dose is recommended after age 85. Pneumococcal polysaccharide (PPSV23) vaccine. One dose is recommended after age 85. Talk to your health care provider about which screenings and vaccines you need and how often you need them. This information is not intended to replace advice given to you by your health care provider. Make sure you discuss any questions you have with your health care provider. Document Released: 11/29/2015 Document Revised: 07/22/2016 Document Reviewed: 09/03/2015 Elsevier Interactive Patient Education  2017 [place] Prevention in the Home Falls can cause injuries. They can happen to people of all ages. There are many things you  can do to make your home safe and to help prevent falls. What can I do on the outside of my home? Regularly fix the edges of walkways and driveways and fix any cracks. Remove anything that might make you trip as you walk through a door, such as a raised step or threshold. Trim any bushes or trees on the path to your home. Use bright outdoor lighting. Clear any walking paths of anything that might make someone trip, such as rocks or tools. Regularly check to see if handrails are loose or broken. Make sure that both sides of any steps have handrails. Any raised decks and porches should have guardrails on the edges. Have any leaves, snow, or ice cleared regularly. Use sand or salt on walking paths during winter. Clean up  any spills in your garage right away. This includes oil or grease spills. What can I do in the bathroom? Use night lights. Install grab bars by the toilet and in the tub and shower. Do not use towel bars as grab bars. Use non-skid mats or decals in the tub or shower. If you need to sit down in the shower, use a plastic, non-slip stool. Keep the floor dry. Clean up any water that spills on the floor as soon as it happens. Remove soap buildup in the tub or shower regularly. Attach bath mats securely with double-sided non-slip rug tape. Do not have throw rugs and other things on the floor that can make you trip. What can I do in the bedroom? Use night lights. Make sure that you have a light by your bed that is easy to reach. Do not use any sheets or blankets that are too big for your bed. They should not hang down onto the floor. Have a firm chair that has side arms. You can use this for support while you get dressed. Do not have throw rugs and other things on the floor that can make you trip. What can I do in the kitchen? Clean up any spills right away. Avoid walking on wet floors. Keep items that you use a lot in easy-to-reach places. If you need to reach something above  you, use a strong step stool that has a grab bar. Keep electrical cords out of the way. Do not use floor polish or wax that makes floors slippery. If you must use wax, use non-skid floor wax. Do not have throw rugs and other things on the floor that can make you trip. What can I do with my stairs? Do not leave any items on the stairs. Make sure that there are handrails on both sides of the stairs and use them. Fix handrails that are broken or loose. Make sure that handrails are as long as the stairways. Check any carpeting to make sure that it is firmly attached to the stairs. Fix any carpet that is loose or worn. Avoid having throw rugs at the top or bottom of the stairs. If you do have throw rugs, attach them to the floor with carpet tape. Make sure that you have a light switch at the top of the stairs and the bottom of the stairs. If you do not have them, ask someone to add them for you. What else can I do to help prevent falls? Wear shoes that: Do not have high heels. Have rubber bottoms. Are comfortable and fit you well. Are closed at the toe. Do not wear sandals. If you use a stepladder: Make sure that it is fully opened. Do not climb a closed stepladder. Make sure that both sides of the stepladder are locked into place. Ask someone to hold it for you, if possible. Clearly mark and make sure that you can see: Any grab bars or handrails. First and last steps. Where the edge of each step is. Use tools that help you move around (mobility aids) if they are needed. These include: Canes. Walkers. Scooters. Crutches. Turn on the lights when you go into a dark area. Replace any light bulbs as soon as they burn out. Set up your furniture so you have a clear path. Avoid moving your furniture around. If any of your floors are uneven, fix them. If there are any pets around you, be aware of where they are. Review your medicines with your doctor.  Some medicines can make you feel dizzy. This  can increase your chance of falling. Ask your doctor what other things that you can do to help prevent falls. This information is not intended to replace advice given to you by your health care provider. Make sure you discuss any questions you have with your health care provider. Document Released: 08/29/2009 Document Revised: 04/09/2016 Document Reviewed: 12/07/2014 Elsevier Interactive Patient Education  2017 [employer].

## 2022-09-17 NOTE — Progress Notes (Signed)
Pt received b12 in rt deltoid, pt tolerated well.

## 2022-09-17 NOTE — Progress Notes (Deleted)
Chronic Care Management Pharmacy Note  Summary: FU visit.  Reports BP has been fairly controlled.  Does report some systolic readings in the 539J.  Have asked him to monitor and record twice per day for two weeks and we will call and check in.  No other concerns at this time.    09/17/2022 Name:  Joseph Simpson MRN:  [id number] DOB:  19-Nov-1934  Subjective: Joseph Simpson is an 87 y.o. year old male who is a primary patient of Hunter, Brayton Mars, MD.  The CCM team was consulted for assistance with disease management and care coordination needs.    Engaged with patient by telephone for follow up visit in response to provider referral for pharmacy case management and/or care coordination services.   Consent to Services:  The patient was given the following information about Chronic Care Management services today, agreed to services, and gave verbal consent: 1. CCM service includes personalized support from designated clinical staff supervised by the primary care provider, including individualized plan of care and coordination with other care providers 2. 24/7 contact phone numbers for assistance for urgent and routine care needs. 3. Service will only be billed when office clinical staff spend 20 minutes or more in a month to coordinate care. 4. Only one practitioner may furnish and bill the service in a calendar month. 5.The patient may stop CCM services at any time (effective at the end of the month) by phone call to the office staff. 6. The patient will be responsible for cost sharing (co-pay) of up to 20% of the service fee (after annual deductible is met). Patient agreed to services and consent obtained.  Patient Care Team: Marin Olp, MD as PCP - General (Family Medicine) Martinique, Peter M, MD as PCP - Cardiology (Cardiology) Martinique, Peter M, MD as Consulting Physician (Cardiology) Almedia Balls, MD as Referring Physician (Orthopedic Surgery) Breck Coons, MD as Consulting Physician  (Internal Medicine) Jari Pigg, MD as Consulting Physician (Dermatology) Franchot Gallo, MD as Consulting Physician (Urology) Hayden Pedro, MD as Consulting Physician (Ophthalmology) Edythe Clarity, [facility] (Pharmacist)  Recent office visits:  07/30/2021 [facility] (PCP) Marin Olp, MD; reduce metoprolol to 12.5 mg twice daily, stop Amlodipine as BP was running too low.   06/27/2021 [facility] (PCP) Marin Olp, MD; start Amlodipine 1.25 mg at bedtime and follow up in 1 month.   Recent consult visits:  07/28/2021 [facility] (cardiology) Almyra Deforest, PA-C; follow up CAD, no medication changes indicated.   07/04/2021 [facility] (sports medicine) [place], DO; right foot pain, no medication changes indicated.   Hospital visits:  None in previous 6 months  Objective:  Lab Results  Component Value Date   CREATININE 0.65 01/14/2022   CREATININE 0.70 01/09/2022   CREATININE 0.66 01/08/2022    Lab Results  Component Value Date   HGBA1C 5.8 01/27/2022   Last diabetic Eye exam:  Lab Results  Component Value Date/Time   HMDIABEYEEXA No Retinopathy 01/16/2020 12:00 AM    Last diabetic Foot exam: No results found for: "HMDIABFOOTEX"      Component Value Date/Time   CHOL 113 01/27/2022 1114   TRIG 132.0 01/27/2022 1114   HDL 37.30 (L) 01/27/2022 1114   CHOLHDL 3 01/27/2022 1114   VLDL 26.4 01/27/2022 1114   LDLCALC 49 01/27/2022 1114   LDLDIRECT 83 06/19/2020 1033       Latest Ref Rng & Units 01/14/2022    8:41 AM 01/07/2022    4:07  AM 01/06/2022    3:54 AM  Hepatic Function  Total Protein 6.0 - 8.3 g/dL 7.0  6.1    Albumin 3.5 - 5.2 g/dL 3.8  3.1  3.8   AST 0 - 37 U/L 19  19    ALT 0 - 53 U/L 18  17    Alk Phosphatase 39 - 117 U/L 63  51    Total Bilirubin 0.2 - 1.2 mg/dL 0.9  1.2      Lab Results  Component Value Date/Time   TSH 1.731 01/05/2022 03:53 AM   TSH 4.27 06/27/2021 10:35 AM   TSH 2.46 08/12/2015 11:27 AM       Latest Ref Rng & Units 01/14/2022    8:41 AM  01/09/2022    3:55 AM 01/08/2022    4:07 AM  CBC  WBC 4.0 - 10.5 K/uL 6.0  7.7  8.3   Hemoglobin 13.0 - 17.0 g/dL 13.1  13.1  13.6   Hematocrit 39.0 - 52.0 % 38.4  37.6  39.0   Platelets 150.0 - 400.0 K/uL 185.0  131  150     Lab Results  Component Value Date/Time   VD25OH 34.92 08/20/2021 10:42 AM    Clinical ASCVD:  The ASCVD Risk score (Arnett DK, et al., 2019) failed to calculate for the following reasons:   The 2019 ASCVD risk score is only valid for ages 69 to 58    Social History   Tobacco Use  Smoking Status Former   Packs/day: 0.10   Years: 4.00   Total pack years: 0.40   Types: Cigarettes   Quit date: 09/03/1956   Years since quitting: 66.0  Smokeless Tobacco Never   BP Readings from Last 3 Encounters:  08/13/22 126/66  07/17/22 130/64  07/13/22 120/62   Pulse Readings from Last 3 Encounters:  08/13/22 71  07/17/22 67  07/13/22 66   Wt Readings from Last 3 Encounters:  09/17/22 178 lb (80.7 kg)  08/13/22 178 lb 9.6 oz (81 kg)  07/17/22 176 lb 6 oz (80 kg)    Assessment: Review of patient past medical history, allergies, medications, health status, including review of consultants reports, laboratory and other test data, was performed as part of comprehensive evaluation and provision of chronic care management services.   SDOH:  (Social Determinants of Health) assessments and interventions performed: Yes SDOH Interventions    Flowsheet Row Clinical Support from 09/17/2022 in [facility] Interventions Intervention Not Indicated  Housing Interventions Intervention Not Indicated  Transportation Interventions Intervention Not Indicated  Financial Strain Interventions Intervention Not Indicated  Physical Activity Interventions Intervention Not Indicated  Stress Interventions Intervention Not Indicated  Social Connections Interventions Intervention Not Indicated      CCM Care Plan  Allergies   Allergen Reactions   Aspirin Other (See Comments)    UPSET STOMACH   Percocet [Oxycodone-Acetaminophen] Nausea And Vomiting   Vitamins [Apatate] Other (See Comments)    Bothers stomach    Medications Reviewed Today     Reviewed by Willette Brace, LPN (Licensed Practical Nurse) on 09/17/22 at [phone]  Med List Status: <None>   Medication Order Taking? Sig Documenting Provider Last Dose Status Informant  acetaminophen (TYLENOL) 500 MG tablet [id number] Yes Take 500-1,000 mg by mouth 2 (two) times daily as needed for moderate pain or headache. [provider] Taking Active Self  apixaban (ELIQUIS) 5 MG TABS tablet [id number] Yes Take 1 tablet (5 mg total) by  mouth 2 (two) times daily. Martinique, Peter M, MD Taking Active   atorvastatin (LIPITOR) 20 MG tablet [id number] Yes TAKE ONE TABLET BY MOUTH THREE TIMES A WEEK Marin Olp, MD Taking Active Self  bevacizumab (AVASTIN) 2.5 mg/0.1 mL SOLN [id number] Yes 2.5 mg by Intravitreal route. Injection to eyes every 5 weeks - use with vigamox [provider] Taking Active Self           Med Note Richardson Landry, JASMINE N   Thu Apr 04, 2020  9:29 AM)    Carboxymethylcellulose Sodium 1 % GEL [id number] Yes Place 1 drop into both eyes at bedtime. [provider] Taking Active Self  Cyanocobalamin (B-12 COMPLIANCE INJECTION IJ) [id number] Yes Inject 1 Dose as directed every 30 (thirty) days. [provider] Taking Active Self           Med Note Marvell Fuller   Sat Jan 03, 2022  3:16 PM)    Docusate Sodium (COLACE PO) [id number] Yes Take by mouth. [provider] Taking Active   losartan (COZAAR) 25 MG tablet [id number] Yes Take 0.5 tablets (12.5 mg total) by mouth daily. If blood pressure over 145 when you check- ok to take other half tablet once per day (so total of 66m per day) HMarin Olp MD Taking Active   metoCLOPramide (REGLAN) 5 MG tablet [id number] TAKE 1 TABLET BY MOUTH TWICE A DAY Armbruster,  SCarlota Raspberry MD Taking Active   metoprolol tartrate (LOPRESSOR) 25 MG tablet [id number] Take 12.5 mg by mouth 2 (two) times daily. [provider] Taking Active   neomycin-polymyxin b-dexamethasone (MAXITROL) 3.5-10000-0.1 OINT [id number]  [provider] Taking Active   pantoprazole (PROTONIX) 40 MG tablet [id number] TAKE 1 TABLET BY MOUTH TWICE A DAY Armbruster, SCarlota Raspberry MD Taking Active   polyethylene glycol (MIRALAX / GLYCOLAX) 17 g packet [id number] Take 17 g by mouth daily as needed for moderate constipation. [provider] Taking Active Self  Propylene Glycol (SYSTANE BALANCE OP) [id number] Place 1 drop into both eyes daily. [provider] Taking Active   tamsulosin (FLOMAX) 0.4 MG CAPS capsule [id number] Take 0.4 mg by mouth daily. [provider] Taking Active Self  VIGAMOX 0.5 % ophthalmic solution [id number] Place 1 drop into both eyes as directed. Take every 5 weeks as directed - use with avastin, instill 1 drop into both eyes 4 times daily for 2 days after eye injections [provider] Taking Active Self           Med Note (Delorise Shiner  Wed Jul 01, 2016 10:46 AM)              Patient Active Problem List   Diagnosis Date Noted   Benign neoplasm of skin of lower limb 03/24/2022   Lentigo 03/24/2022   Melanocytic nevi of trunk 03/24/2022   Melanocytic nevus of face 03/24/2022   Other seborrheic keratosis 03/24/2022   Personal history of other specified conditions 03/24/2022   Physical deconditioning 01/08/2022   GI bleed 01/07/2022   UTI (urinary tract infection) 01/07/2022   Protein-calorie malnutrition, moderate ([place] 01/07/2022   Left wrist pain 10/01/2021   Bilateral foot pain 07/04/2021   S/P hip replacement, left 02/11/2021   Hyperglycemia 12/21/2019   B12 deficiency 03/09/2019   Osteoarthritis of left hip 01/30/2019   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  12/29/2017   Syncope    New onset a-fib ([place]  Gastroparesis 02/15/2017   Hx of adenomatous colonic polyps 02/15/2017   Left inguinal hernia 07/12/2016   Hyperlipidemia 06/10/2016   PAC (premature atrial contraction) 10/25/2015   Squamous cell carcinoma in situ 09/09/2015   BPH (benign prostatic hyperplasia) 11/23/2014   Palpitations 07/31/2014   Cough 05/10/2014   Pulmonary nodule 05/10/2014   Dyspnea 02/27/2014   CAD (coronary artery disease)    Murmur 02/10/2012   Macular degeneration 11/17/2011   Essential hypertension 09/23/2007   GERD 09/23/2007    Immunization History  Administered Date(s) Administered   Fluad Quad(high Dose 65+) 07/28/2019, 08/13/2020, 07/30/2021, 08/13/2022   Influenza Split 09/04/2011, 08/19/2012   Influenza Whole 08/31/2007, 09/16/2008, 10/07/2009, 10/01/2010   Influenza, High Dose Seasonal PF 08/25/2013, 09/24/2015, 08/27/2016, 09/01/2017, 08/23/2018   Influenza,inj,Quad PF,6+ Mos 09/05/2014   Moderna Sars-Covid-2 Vaccination 10/04/2020   PFIZER(Purple Top)SARS-COV-2 Vaccination 12/25/2019, 01/15/2020   PNEUMOCOCCAL CONJUGATE-20 07/13/2022   Pfizer Covid-19 Vaccine Bivalent Booster 62yr & up 10/28/2021   Pneumococcal Conjugate-13 11/23/2014   Pneumococcal Polysaccharide-23 10/16/2002   Td 03/04/1998, 10/09/2008   Tdap 07/07/2019   Zoster Recombinat (Shingrix) 05/17/2018, 07/19/2018   Zoster, Live 04/28/2011   Conditions to be addressed/monitored: HLD, HTN, Hyperglycemia, CAD, PAC, PVC, OSA, OA, GERD, B12 deficiency  There are no care plans that you recently modified to display for this patient.       Future Appointments  Date Time Provider [place] 09/21/2022  9:20 AM HMarin Olp MD [facility]  09/28/2022  2:00 PM JMartinique Peter M, MD [facility] None  09/28/2022  3:00 PM [facility] CCM PHARMACIST [facility]  10/01/2022  7:40 AM MHayden Pedro MD [facility] None  10/21/2022  9:30 AM [facility] NURSE [facility]     Current Barriers:  Slightly elevated BP  Pharmacist Clinical Goal(s):  Patient will contact provider office for questions/concerns as evidenced notation of same in electronic health record through collaboration with PharmD and provider.   Interventions: 1:1 collaboration with HMarin Olp MD regarding development and update of comprehensive plan of care as evidenced by provider attestation and co-signature Inter-disciplinary care team collaboration (see longitudinal plan of care) Comprehensive medication review performed; medication list updated in electronic medical record No medication changes, monitoring BP  Hypertension (BP goal <130/80) -Controlled -OSA - previously used cpap, not interested in using  -Current treatment: Losartan 12.569mAppropriate, Effective, Safe, Accessible -Medications previously tried: lisinopril, amlodipine    -Current home readings: 117/61 most recently, ~120/60 on avg -Denies hypotensive/hypertensive symptoms -Educated on Daily salt intake goal < 2300 mg; Importance of home blood pressure monitoring; -Counseled to monitor BP at home once every 1-2 weeks, document, and provide log at future appointments -Recommended to continue current medication   Update 09/02/21 Home readings: 140-150/70s Counseled on BP goals.  Reviewed last office BP which was also elevated, however he was in pain.  Previously had to stop amlodipine due to dizziness.  Has also tried HCTZ in the past.  Have asked him to monitor and report consistent readings < 14355ystolic.  He denies any dizziness at this time. Continue meds for now - if BP remains elevated may need to consider low dose HCTZ.  Update 03/17/22 130/78 this morning. Normally < 130/80. Losartan as been reduced all the way down to 12.47m61maily since the hospital stay.  BP has been well controlled per patient.  Denies dizziness or HA. Continue current dosages for now.  He is going to monitor BP at home.  He is  to call if BP  is consistently > 130/80.  Has mentioned that some of his morning readings were elvated in the 361Q systolic. Will check in two weeks to assess.  PAC, PVCs (Goal: minimize side effects) -Controlled -HR has been in 50-60s while at rest  -Current treatment  Metoprolol tartrate 25 mg tablet twice daily (Dr Martinique) -Denies side effects  -Recommended to continue current medication  Hyperlipidemia: (LDL goal < 70) -Controlled -2013- s/p cardiac cath with minimal nonobstructive CAD. No ASA due to stomach history. -Current treatment: Atorvastatin 20 mg three times WEEKLY (2/3 90 DS) Appropriate, Effective, Safe, Accessible -Medications previously tried: atorvastatin 10 mg daily, atorvastatin 40 mg once WEEKLY  -Current dietary patterns: no added salt, minimal red meat, eating a lot -Current exercise habits: left hip replacement 02/11/2021. Has been able to walk around the house. Doesn't really due -Educated on Cholesterol goals;  Importance of limiting foods high in cholesterol; -Recommended to continue current medication  Update 09/02/21 Continues to be adherent to 3x per week dose.  Most recent LDL in Feb was controlled. Continue current medication. Recheck lipids at next CPE.  Update 03/18/22 NO changes needed at this time.  LDL was well controlled while patient was in the hospital.  Denies any adverse effects at this time.   Continue current dose. Continue routine lipid screenings.  GERD, Gastroparesis (Goal: minimize symptoms) Reports Dr Erlene Quan is retiring and will need to get a new GI doctor, preference is to stay with [facility] at this time. Will let us know if wanting to see a [facility] provider.   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 Doing HH PT and will be doing PT in office 02/24/2021. Was discharged on: Senokot-S 8.6-50 mg once daily  Counseled on side effects, pain medication use, has not used tramadol in about three days. Tylenol is go-to OTC pain medication, not otherwise using NSAIDs -Recommended to continue current medication   Patient Goals/Self-Care Activities Patient will:  - take medications as prescribed engage in dietary modifications by reducing intake of foods high in carbohydrates/fat/salt exercise as tolerated   Follow Up Plan: 1 month BP call, [place] telephone visit in ~6 months  Medication Assistance: None required.  Patient affirms current coverage meets needs.  Patient's preferred pharmacy is:  [facility] # [address], [place] [address] [place] [address] Phone: [phone] Fax: [phone]  Follow Up:  Patient agrees to Care Plan and Follow-up.

## 2022-09-21 ENCOUNTER — Ambulatory Visit (INDEPENDENT_AMBULATORY_CARE_PROVIDER_SITE_OTHER): Payer: Medicare Other | Admitting: Family Medicine

## 2022-09-21 ENCOUNTER — Encounter: Payer: Self-pay | Admitting: Family Medicine

## 2022-09-21 VITALS — BP 132/62 | HR 76 | Temp 98.4°F | Ht 73.0 in | Wt 172.8 lb

## 2022-09-21 DIAGNOSIS — I10 Essential (primary) hypertension: Secondary | ICD-10-CM | POA: Diagnosis not present

## 2022-09-21 DIAGNOSIS — I4891 Unspecified atrial fibrillation: Secondary | ICD-10-CM | POA: Diagnosis not present

## 2022-09-21 DIAGNOSIS — I2510 Atherosclerotic heart disease of native coronary artery without angina pectoris: Secondary | ICD-10-CM

## 2022-09-21 DIAGNOSIS — H04123 Dry eye syndrome of bilateral lacrimal glands: Secondary | ICD-10-CM | POA: Diagnosis not present

## 2022-09-21 DIAGNOSIS — H35033 Hypertensive retinopathy, bilateral: Secondary | ICD-10-CM | POA: Diagnosis not present

## 2022-09-21 DIAGNOSIS — E785 Hyperlipidemia, unspecified: Secondary | ICD-10-CM | POA: Diagnosis not present

## 2022-09-21 DIAGNOSIS — H16122 Filamentary keratitis, left eye: Secondary | ICD-10-CM | POA: Diagnosis not present

## 2022-09-21 DIAGNOSIS — H353231 Exudative age-related macular degeneration, bilateral, with active choroidal neovascularization: Secondary | ICD-10-CM | POA: Diagnosis not present

## 2022-09-21 DIAGNOSIS — R739 Hyperglycemia, unspecified: Secondary | ICD-10-CM

## 2022-09-21 LAB — CBC WITH DIFFERENTIAL/PLATELET
Basophils Absolute: 0 10*3/uL (ref 0.0–0.1)
Basophils Relative: 0.4 % (ref 0.0–3.0)
Eosinophils Absolute: 0.2 10*3/uL (ref 0.0–0.7)
Eosinophils Relative: 3.1 % (ref 0.0–5.0)
HCT: 45.3 % (ref 39.0–52.0)
Hemoglobin: 15.2 g/dL (ref 13.0–17.0)
Lymphocytes Relative: 18.2 % (ref 12.0–46.0)
Lymphs Abs: 1.1 10*3/uL (ref 0.7–4.0)
MCHC: 33.6 g/dL (ref 30.0–36.0)
MCV: 95.7 fl (ref 78.0–100.0)
Monocytes Absolute: 0.5 10*3/uL (ref 0.1–1.0)
Monocytes Relative: 8.2 % (ref 3.0–12.0)
Neutro Abs: 4.1 10*3/uL (ref 1.4–7.7)
Neutrophils Relative %: 70.1 % (ref 43.0–77.0)
Platelets: 142 10*3/uL — ABNORMAL LOW (ref 150.0–400.0)
RBC: 4.73 Mil/uL (ref 4.22–5.81)
RDW: 12.9 % (ref 11.5–15.5)
WBC: 5.9 10*3/uL (ref 4.0–10.5)

## 2022-09-21 LAB — COMPREHENSIVE METABOLIC PANEL
ALT: 13 U/L (ref 0–53)
AST: 17 U/L (ref 0–37)
Albumin: 4.5 g/dL (ref 3.5–5.2)
Alkaline Phosphatase: 64 U/L (ref 39–117)
BUN: 13 mg/dL (ref 6–23)
CO2: 31 mEq/L (ref 19–32)
Calcium: 10 mg/dL (ref 8.4–10.5)
Chloride: 100 mEq/L (ref 96–112)
Creatinine, Ser: 0.75 mg/dL (ref 0.40–1.50)
GFR: 82.1 mL/min (ref >60.00)
Glucose, Bld: 111 mg/dL — ABNORMAL HIGH (ref 70–99)
Potassium: 3.9 mEq/L (ref 3.5–5.1)
Sodium: 140 mEq/L (ref 135–145)
Total Bilirubin: 1 mg/dL (ref 0.2–1.2)
Total Protein: 7.3 g/dL (ref 6.0–8.3)

## 2022-09-21 LAB — HEMOGLOBIN A1C: Hgb A1c MFr Bld: 5.7 % (ref 4.6–6.5)

## 2022-09-21 LAB — TSH: TSH: 2.9 u[IU]/mL (ref 0.35–5.50)

## 2022-09-21 MED ORDER — LOSARTAN POTASSIUM 25 MG PO TABS: 12.5000 mg | ORAL_TABLET | Freq: Every day | ORAL | 3 refills | Status: DC

## 2022-09-21 NOTE — Progress Notes (Signed)
Phone [phone] In person visit   Subjective:   Joseph Simpson is a 87 y.o. year old very pleasant male patient who presents for/with See problem oriented charting Chief Complaint  Patient presents with   Hypertension   b12 deficiency   Past Medical History-  Patient Active Problem List   Diagnosis Date Noted   GI bleed 01/07/2022    Priority: High   New onset a-fib [facility])     Priority: High   CAD (coronary artery disease)     Priority: High   Macular degeneration 11/17/2011    Priority: High   Hyperglycemia 12/21/2019    Priority: Medium    B12 deficiency 03/09/2019    Priority: Medium    Gastroparesis 02/15/2017    Priority: Medium    Hx of adenomatous colonic polyps 02/15/2017    Priority: Medium    Hyperlipidemia 06/10/2016    Priority: Medium    BPH (benign prostatic hyperplasia) 11/23/2014    Priority: Medium    Palpitations 07/31/2014    Priority: Medium    Dyspnea 02/27/2014    Priority: Medium    Essential hypertension 09/23/2007    Priority: Medium    GERD 09/23/2007    Priority: Medium    Osteoarthritis of left hip 01/30/2019    Priority: Low   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 12/29/2017    Priority: Low   Left inguinal hernia 07/12/2016    Priority: Low   PAC (premature atrial contraction) 10/25/2015    Priority: Low   Squamous cell carcinoma in situ 09/09/2015    Priority: Low   Cough 05/10/2014    Priority: Low   Pulmonary nodule 05/10/2014    Priority: Low   Murmur 02/10/2012    Priority: Low   Left wrist pain 10/01/2021    Priority: 1.   Bilateral foot pain 07/04/2021    Priority: 1.   S/P hip replacement, left 02/11/2021    Priority: 1.    Medications- reviewed and updated Current Outpatient Medications  Medication Sig Dispense Refill   acetaminophen (TYLENOL) 500 MG tablet Take 500-1,000 mg by mouth 2 (two) times daily as needed for moderate pain or headache.     apixaban (ELIQUIS) 5 MG TABS tablet  Take 1 tablet (5 mg total) by mouth 2 (two) times daily. 180 tablet 3   atorvastatin (LIPITOR) 20 MG tablet TAKE ONE TABLET BY MOUTH THREE TIMES A WEEK 39 tablet 3   bevacizumab (AVASTIN) 2.5 mg/0.1 mL SOLN 2.5 mg by Intravitreal route. Injection to eyes every 5 weeks - use with vigamox     Carboxymethylcellulose Sodium 1 % GEL Place 1 drop into both eyes at bedtime.     Cyanocobalamin (B-12 COMPLIANCE INJECTION IJ) Inject 1 Dose as directed every 30 (thirty) days.     Docusate Sodium (COLACE PO) Take by mouth.     losartan (COZAAR) 25 MG tablet Take 0.5-1 tablets (12.5-25 mg total) by mouth daily. If blood pressure over 145 when you check- ok to take other half tablet once per day (so total of '25mg'$  per day) 135 tablet 3   metoCLOPramide (REGLAN) 5 MG tablet TAKE 1 TABLET BY MOUTH TWICE A DAY 60 tablet 5   metoprolol tartrate (LOPRESSOR) 25 MG tablet Take 12.5 mg by mouth 2 (two) times daily.     neomycin-polymyxin b-dexamethasone (MAXITROL) 3.5-10000-0.1 OINT      pantoprazole (PROTONIX) 40 MG tablet TAKE 1 TABLET BY MOUTH TWICE A DAY 60 tablet 1   polyethylene  glycol (MIRALAX / GLYCOLAX) 17 g packet Take 17 g by mouth daily as needed for moderate constipation.     Propylene Glycol (SYSTANE BALANCE OP) Place 1 drop into both eyes daily.     tamsulosin (FLOMAX) 0.4 MG CAPS capsule Take 0.4 mg by mouth daily.     VIGAMOX 0.5 % ophthalmic solution Place 1 drop into both eyes as directed. Take every 5 weeks as directed - use with avastin, instill 1 drop into both eyes 4 times daily for 2 days after eye injections     No current facility-administered medications for this visit.     Objective:  BP 132/62   Pulse 76   Temp 98.4 F (36.9 C)   Ht '6\' 1"'$  (1.854 m)   Wt 172 lb 12.8 oz (78.4 kg)   SpO2 94%   BMI 22.80 kg/m  Gen: NAD, resting comfortably Left TM normal- minimal cerumen around edges of canal CV: RRR - appears to be in sinus Lungs: CTAB no crackles, wheeze, rhonchi Ext: no  edema Skin: warm, dry     Assessment and Plan   #Hearing loss/need for aid-we removed cerumen in September so he can be fitted-he reports was able to get aids but did have some lingering wax- on exam today  reassuring    #some weight loss - we opted to check TSH but if continues at home despite bulking calories to let us know  #Paroxysmal atrial fibrillation-follows with Dr. Martinique S: Anticoagulation: Eliquis 5 mg twice daily  *(Does have gastric ulceration history as well as partial removal of stomach (one third) and previously avoided aspirin with CAD) -Rate control: Metoprolol 12.5 mg twice daily -Was noted to have 6% burden on Zio monitor in April 2023  A/P: appropriately anticoagulated and rate controlled- continue current medicine  #Hypertension  S: Controlled on losartan 12.5 mg daily (with extra dose 12.5 mg if blood pressure above 145), metoprolol 12.5 mg twice daily (also helps palpitations- reduced from full tablet due to bradycardia 06/2021) -Orthostatic issues on amlodipine 1.25 mg  -Monitors his home blood pressures- only had to take extra dose 3 x since last visit   BP Readings from Last 3 Encounters:  09/21/22 132/62  08/13/22 126/66  07/17/22 130/64   A/P: Controlled. Continue current medications.    -did run into an issue when he had to take the extra doses that pharmacy wouldn't refill- we will adjust to #135 as a 3 month supply   #Hyperlipidemia/CAD-follows with Dr. Martinique  S: Patient has had minimally obstructive CAD on cath 2013.  Medication: Eliquis 5 mg twice daily (cannot use aspirin with prior one third removal of stomach) -Atorvastatin 20 mg 3 times a week CAD asymptomatic with no chest pain or shortness of breath Lab Results  Component Value Date   CHOL 113 01/27/2022   HDL 37.30 (L) 01/27/2022   LDLCALC 49 01/27/2022   LDLDIRECT 83 06/19/2020   TRIG 132.0 01/27/2022   CHOLHDL 3 01/27/2022   A/P: CAD asymptomatic-continue current medications -  Lipids well controlled-continue current medications  %GERD-sees Dr. Havery Moros (prior Dr. Erlene Quan in [place]) S: Patient is compliant with high-dose PPI-pantoprazole 40 mg twice a day per  GI.  Also on Reglan. A/P:  Controlled. Continue current medications.    %BPH- Dr. Diona Fanti urology S: Patient remains on tamsulosin 0.4 mg daily- also to help prevent UTI recurrence- hospitalization february 2023 A/P: Controlled. Continue current medications.   # Hyperglycemia/insulin resistance/prediabetes- peak a1c 6.1 S:  Medication: none Exercise  and diet- some walking, eating reasonably healthy Lab Results  Component Value Date   HGBA1C 5.8 01/27/2022   HGBA1C 6.0 06/27/2021   HGBA1C 5.7 12/25/2020   A/P: hopefully stable- update a1c today. Continue without meds for now  # B12 deficiency S: Current treatment/medication (oral vs. IM): Monthly B12 injections. Just had last week  Lab Results  Component Value Date   VITAMINB12 1,040 (H) 01/07/2022  A/P: has been controlled- recheck next visit visit   Recommended follow WK:MQKMMN in about 6 months (around 03/22/2023) for followup or sooner if needed.Schedule b4 you leave. Future Appointments  Date Time Provider [place]  09/28/2022  2:00 PM Martinique, Peter M, MD [facility] None  09/28/2022  3:00 PM [facility] CCM PHARMACIST [facility]  10/01/2022  7:40 AM Hayden Pedro, MD [facility] None  10/21/2022  9:30 AM [facility] NURSE [facility]    Lab/Order associations:   ICD-10-CM   1. Coronary artery disease involving native coronary artery of native heart without angina pectoris  I25.10     2. Hyperlipidemia, unspecified hyperlipidemia type  E78.5 TSH    3. Hyperglycemia  R73.9 HgB A1c    4. Essential hypertension  I10 Comprehensive metabolic panel    CBC with Differential/Platelet    5. New onset a-fib ([place])  I48.91       Meds ordered this encounter  Medications   losartan (COZAAR) 25 MG tablet    Sig: Take 0.5-1 tablets  (12.5-25 mg total) by mouth daily. If blood pressure over 145 when you check- ok to take other half tablet once per day (so total of '25mg'$  per day)    Dispense:  135 tablet    Refill:  3    Return precautions advised.  Garret Reddish, MD

## 2022-09-21 NOTE — Patient Instructions (Addendum)
Please stop by lab before you go If you have mychart- we will send your results within 3 business days of [place] receiving them.  If you do not have mychart- we will call you about results within 5 business days of [place] receiving them.  *please also note that you will see labs on mychart as soon as they post. I will later go in and write notes on them- will say "notes from Dr. Yong Channel"   Send [place] date and location of covid shot and we will log in the system  Recommended follow up: Return in about 6 months (around 03/22/2023) for followup or sooner if needed.Schedule b4 you leave.

## 2022-09-22 NOTE — Progress Notes (Signed)
Cardiology Office Note:    Date:  09/28/2022   ID:  Joseph Simpson, DOB 1935/05/02, MRN [id number]  PCP:  Marin Olp, MD   [facility] Providers Cardiologist:  Arnisha Laffoon Martinique, MD     Referring MD: Marin Olp, MD   Chief Complaint  Patient presents with   Coronary Artery Disease    History of Present Illness:    Joseph Simpson is a 87 y.o. male with a hx of hypertension, PVCs, obstructive sleep apnea not on CPAP, presyncope, and nonobstructive CAD.  He had a abnormal stress test in 2013, follow-up cardiac catheterization demonstrated nonobstructive CAD.    He was seen by his PCP Dr. Yong Channel on 06/27/2021, heart rate at the time was 40 bpm.  His metoprolol was cut back. He was placed on amlodipine but developed dizziness and this was stopped.   He was hospitalized in February 2023 with coffee ground emesis and UTI. He was treated with IV antibiotics. EGD showed esophagitis and gastritis with some gastric ulcerations. He was placed on Carafate in addition to Protonix. He was found to be in Afib with controlled rate. Echo was unremarkable. He was not started on anticoagulation due to GI bleed. Hgb was stable at 13.1. Later seen by urology and given another course of antibiotics. Event monitor was done showing paroxysmal Afib with burden 6%. Rate generally well controlled. He was started on anticoagulation with Eliquis as an outpatient.   On follow up today he is doing very well. No chest pain or dyspnea. Has mild palpitations at times. Had one episode of lower chest pain that resolved with TUMS.  No edema. No bleeding on Eliquis. Typically BP about 681 systolic.    Past Medical History:  Diagnosis Date   Aortic atherosclerosis ([place])    Arthritis    B12 deficiency    Bladder calculi    CAD (coronary artery disease)    cardiologist--- dr Martinique;   abnormal nuclear stress test 11/ 2013;  s/p cardiac cath 10-21-2012 minimal nonobstructiive CAD w/ normal LVF   Diverticulosis     Dyslipidemia    Esophagitis    Full dentures    Gastroparesis    followed by [name] PA ( [facility] in [place]) s/p gastrectomy for ulcers in 1970s   GERD (gastroesophageal reflux disease)    Heart murmur    Hiatal hernia    History of adenomatous polyp of colon    History of kidney stones    History of melanoma 2008   s/p left forearn wide local excision 06/ 2008   History of syncope 2010   cardiologist --- dr Martinique;  (11-25-2021 per pt no syncope or near syncope since)   Hypertension    Macular degeneration of both eyes    Nocturia associated with benign prostatic hyperplasia    OSA (obstructive sleep apnea)    no cpap   Polyneuropathy    PVC's (premature ventricular contractions) 2010   06/ 2010 per event monitor SR w/ occasional PVCs and 4 beat SVT (in epic)   Wears glasses     Past Surgical History:  Procedure Laterality Date   APPENDECTOMY     child   CARDIAC CATHETERIZATION  10/2012   minimal nonobstructive CAD with normal LV function   CATARACT EXTRACTION W/ INTRAOCULAR LENS IMPLANT Bilateral    2009 approx   CYSTOSCOPY WITH LITHOLAPAXY N/A 07/13/2016   Procedure: CYSTOSCOPY WITH LITHOLAPAXY;  Surgeon: Franchot Gallo, MD;  Location: [facility];  Service: Urology;  Laterality: N/A;   CYSTOSCOPY WITH LITHOLAPAXY N/A 12/01/2021   Procedure: CYSTOSCOPY WITH LITHOLAPAXY;  Surgeon: Franchot Gallo, MD;  Location: [facility];  Service: Urology;  Laterality: N/A;   CYSTOSCOPY/RETROGRADE/URETEROSCOPY/STONE EXTRACTION WITH BASKET  02/24/2012   Procedure: CYSTOSCOPY/RETROGRADE/URETEROSCOPY/STONE EXTRACTION WITH BASKET;  Surgeon: Franchot Gallo, MD;  Location: [facility];  Service: Urology;  Laterality: Bilateral;  1 HOUR   ESOPHAGOGASTRODUODENOSCOPY N/A 01/06/2022   Procedure: ESOPHAGOGASTRODUODENOSCOPY (EGD);  Surgeon: Arta Silence, MD;  Location: [facility];  Service: Gastroenterology;  Laterality: N/A;   EXTRACORPOREAL SHOCK  WAVE LITHOTRIPSY  11/16/2011   '@[facility]'$    GASTRECTOMY     1970s  for ulcers   HEMORRHOID SURGERY     yrs ago   HOLMIUM LASER APPLICATION N/A 5/68/1275   Procedure: HOLMIUM LASER APPLICATION;  Surgeon: Franchot Gallo, MD;  Location[facility];  Service: Urology;  Laterality: N/A;   INGUINAL HERNIA REPAIR Left 07/13/2016   Procedure: REPAIR LEFT INGUINAL HERNIA;  Surgeon: Armandina Gemma, MD;  Location: [facility];  Service: General;  Laterality: Left;   INSERTION OF MESH Left 07/13/2016   Procedure: INSERTION OF MESH;  Surgeon: Armandina Gemma, MD;  Location: [facility];  Service: General;  Laterality: Left;   MELANOMA EXCISION Left 05/05/2007   '@[facility]'$  ;   LEFT FOREARM   TONSILLECTOMY     child   TOTAL HIP ARTHROPLASTY Left 02/11/2021   Procedure: TOTAL HIP ARTHROPLASTY;  Surgeon: Marchia Bond, MD;  Location: [facility];  Service: Orthopedics;  Laterality: Left;   TOTAL SHOULDER ARTHROPLASTY Right 12/29/2017   Procedure: TOTAL SHOULDER ARTHROPLASTY;  Surgeon: Hiram Gash, MD;  Location: [place];  Service: Orthopedics;  Laterality: Right;    Current Medications: Current Meds  Medication Sig   acetaminophen (TYLENOL) 500 MG tablet Take 500-1,000 mg by mouth 2 (two) times daily as needed for moderate pain or headache.   apixaban (ELIQUIS) 5 MG TABS tablet Take 1 tablet (5 mg total) by mouth 2 (two) times daily.   atorvastatin (LIPITOR) 20 MG tablet TAKE ONE TABLET BY MOUTH THREE TIMES A WEEK   bevacizumab (AVASTIN) 2.5 mg/0.1 mL SOLN 2.5 mg by Intravitreal route. Injection to eyes every 5 weeks - use with vigamox   Carboxymethylcellulose Sodium 1 % GEL Place 1 drop into both eyes at bedtime.   Cyanocobalamin (B-12 COMPLIANCE INJECTION IJ) Inject 1 Dose as directed every 30 (thirty) days.   Docusate Sodium (COLACE PO) Take by mouth.   losartan (COZAAR) 25 MG tablet Take 0.5-1 tablets (12.5-25 mg total) by mouth daily. If blood pressure over 145 when you check- ok to take other half tablet  once per day (so total of '25mg'$  per day)   metoCLOPramide (REGLAN) 5 MG tablet TAKE 1 TABLET BY MOUTH TWICE A DAY   metoprolol tartrate (LOPRESSOR) 25 MG tablet Take 12.5 mg by mouth 2 (two) times daily.   pantoprazole (PROTONIX) 40 MG tablet TAKE 1 TABLET BY MOUTH TWICE A DAY   Propylene Glycol (SYSTANE BALANCE OP) Place 1 drop into both eyes daily.   tamsulosin (FLOMAX) 0.4 MG CAPS capsule Take 0.4 mg by mouth daily.   VIGAMOX 0.5 % ophthalmic solution Place 1 drop into both eyes as directed. Take every 5 weeks as directed - use with avastin, instill 1 drop into both eyes 4 times daily for 2 days after eye injections     Allergies:   Aspirin, Percocet [oxycodone-acetaminophen], and Vitamins [apatate]   Social History   Socioeconomic History  Marital status: Widowed    Spouse name: Not on file   Number of children: 1   Years of education: Not on file   Highest education level: Not on file  Occupational History   Occupation: retired  Tobacco Use   Smoking status: Former    Packs/day: 0.10    Years: 4.00    Total pack years: 0.40    Types: Cigarettes    Quit date: 09/03/1956    Years since quitting: 66.1   Smokeless tobacco: Never  Vaping Use   Vaping Use: Never used  Substance and Sexual Activity   Alcohol use: No    Alcohol/week: 0.0 standard drinks of alcohol   Drug use: Never   Sexual activity: Yes  Other Topics Concern   Not on file  Social History Narrative   Widowed (lost wife 2015 from lung cancer never smoker), 1 son, 2 grandkids, 1 greatgrandkid (born on Christmas)      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       Hobbies: ride motorcycle (slingshot 3 year), rabbit and dear hunt   Social Determinants of Health   Financial Resource Strain: Low Risk  (09/17/2022)   Overall Financial Resource Strain (CARDIA)    Difficulty of Paying Living Expenses: Not hard at all  Food Insecurity: No Food Insecurity (09/17/2022)   Hunger Vital Sign    Worried  About Running Out of Food in the Last Year: Never true    Ran Out of Food in the Last Year: Never true  Transportation Needs: No Transportation Needs (09/17/2022)   PRAPARE - Hydrologist (Medical): No    Lack of Transportation (Non-Medical): No  Physical Activity: Sufficiently Active (09/17/2022)   Exercise Vital Sign    Days of Exercise per Week: 5 days    Minutes of Exercise per Session: 30 min  Stress: No Stress Concern Present (09/17/2022)   [place]    Feeling of Stress : Not at all  Social Connections: Moderately Isolated (09/17/2022)   Social Connection and Isolation Panel [NHANES]    Frequency of Communication with Friends and Family: More than three times a week    Frequency of Social Gatherings with Friends and Family: More than three times a week    Attends Religious Services: More than 4 times per year    Active Member of [employer] or Organizations: No    Attends Archivist Meetings: Never    Marital Status: Widowed     Family History: The patient's family history includes Cancer in his sister; Heart disease in his mother; Lung cancer in his brother; Pneumonia in his father.  ROS:   Please see the history of present illness.     All other systems reviewed and are negative.  EKGs/Labs/Other Studies Reviewed:    The following studies were reviewed today:  Echo 04/16/2009  1. Left ventricle: The cavity size was normal. Wall thickness was      increased in a pattern of mild LVH. Systolic function was normal.      The estimated ejection fraction was in the range of 55% to 60%.      Wall motion was normal; there were no regional wall motion      abnormalities.   2. Aortic valve: Trivial regurgitation.   Echo 01/05/22: IMPRESSIONS     1. Left ventricular ejection fraction, by estimation, is 60 to 65%. The  left ventricle  has normal function. The left ventricle has no  regional  wall motion abnormalities. Left ventricular diastolic function could not  be evaluated.   2. Right ventricular systolic function is normal. The right ventricular  size is normal. There is normal pulmonary artery systolic pressure.   3. Left atrial size was mildly dilated.   4. The mitral valve is normal in structure. Mild mitral valve  regurgitation. No evidence of mitral stenosis.   5. The aortic valve is normal in structure. There is mild calcification  of the aortic valve. There is mild thickening of the aortic valve. Aortic  valve regurgitation is not visualized.   Highlights    Normal sinus rhythm Paroxysmal Afib 6% burden. average rate 80 bpm Occasional PACs. Brief runs of SVT Occasional PVCs. 4 beat run of NSVT One episode of accelerated ventricular rhythm lasting 16 seconds. HR 102 bpm     Patch Wear Time:  14 days and 0 hours (2023-03-17T18:43:30-0400 to 2023-03-31T18:43:34-0400)   Patient had a min HR of 39 bpm, max HR of 214 bpm, and avg HR of 80 bpm. Predominant underlying rhythm was Sinus Rhythm. 16 Ventricular Tachycardia runs occurred, the run with the fastest interval lasting 4 beats with a max rate of 214 bpm, the longest  lasting 16.3 secs with an avg rate of 102 bpm. 168 Supraventricular Tachycardia runs occurred, the run with the fastest interval lasting 5 beats with a max rate of 182 bpm, the longest lasting 19.4 secs with an avg rate of 98 bpm. Atrial Fibrillation  occurred (6% burden), ranging from 50-138 bpm (avg of 80 bpm), the longest lasting 5 hours 39 mins with an avg rate of 76 bpm. Idioventricular Rhythm was present. Isolated SVEs were occasional (1.6%, 24779), SVE Couplets were rare (<1.0%, 1428), and SVE  Triplets were rare (<1.0%, 423). Isolated VEs were occasional (2.1%, 32526), VE Couplets were rare (<1.0%, 494), and VE Triplets were rare (<1.0%, 16). Ventricular Bigeminy and Trigeminy were present.     EKG:  EKG is ordered today.  The ekg  ordered today demonstrates normal sinus rhythm, first degree AV block, Occ PVC. no significant ST-T wave changes. I have personally reviewed and interpreted this study.   Recent Labs: 01/09/2022: Magnesium 2.0 09/21/2022: ALT 13; BUN 13; Creatinine, Ser 0.75; Hemoglobin 15.2; Platelets 142.0; Potassium 3.9; Sodium 140; TSH 2.90  Recent Lipid Panel    Component Value Date/Time   CHOL 113 01/27/2022 1114   TRIG 132.0 01/27/2022 1114   HDL 37.30 (L) 01/27/2022 1114   CHOLHDL 3 01/27/2022 1114   VLDL 26.4 01/27/2022 1114   LDLCALC 49 01/27/2022 1114   LDLDIRECT 83 06/19/2020 1033     Risk Assessment/Calculations:           Physical Exam:    VS:  BP (!) 97/52 (BP Location: Left Arm, Patient Position: Sitting, Cuff Size: Normal)   Pulse 68   Ht '6\' 1"'$  (1.854 m)   Wt 173 lb 6.4 oz (78.7 kg)   SpO2 96%   BMI 22.88 kg/m     Wt Readings from Last 3 Encounters:  09/28/22 173 lb 6.4 oz (78.7 kg)  09/21/22 172 lb 12.8 oz (78.4 kg)  09/17/22 178 lb (80.7 kg)     GEN:  Well nourished, well developed in no acute distress HEENT: Normal NECK: No JVD; No carotid bruits LYMPHATICS: No lymphadenopathy CARDIAC: RRR, no murmurs, rubs, gallops RESPIRATORY:  Clear to auscultation without rales, wheezing or rhonchi  ABDOMEN: Soft, non-tender, non-distended MUSCULOSKELETAL:  No edema; No deformity  SKIN: Warm and dry NEUROLOGIC:  Alert and oriented x 3 PSYCHIATRIC:  Normal affect   ASSESSMENT:    1. Paroxysmal atrial fibrillation (HCC)   2. Hyperlipidemia LDL goal <70   3. Primary hypertension   4. Coronary artery disease involving native coronary artery of native heart without angina pectoris       PLAN:    In order of problems listed above:  CAD: History of nonobstructive CAD on previous cath.  Continue Lipitor.    Hypertension: BP is well controlled on low dose losartan and metoprolol.   Hyperlipidemia: Continue Lipitor.  4.   Paroxysmal AFib - now in NSR. Exacerbated by  stress of other illnesses. [place] vasc score of 4. Burden of 6% on monitor with controlled rate. Will continue rate control and anticoagulation.   Follow up in 6 months       Medication Adjustments/Labs and Tests Ordered: Current medicines are reviewed at length with the patient today.  Concerns regarding medicines are outlined above.  No orders of the defined types were placed in this encounter.  No orders of the defined types were placed in this encounter.    There are no Patient Instructions on file for this visit.   Signed, Dianey Suchy Martinique, MD  09/28/2022 2:33 PM    [facility]

## 2022-09-28 ENCOUNTER — Telehealth: Payer: Medicare Other

## 2022-09-28 ENCOUNTER — Ambulatory Visit: Payer: Medicare Other | Attending: Cardiology | Admitting: Cardiology

## 2022-09-28 ENCOUNTER — Ambulatory Visit (INDEPENDENT_AMBULATORY_CARE_PROVIDER_SITE_OTHER): Payer: Medicare Other | Admitting: Pharmacist

## 2022-09-28 ENCOUNTER — Encounter: Payer: Self-pay | Admitting: Cardiology

## 2022-09-28 VITALS — BP 97/52 | HR 68 | Ht 73.0 in | Wt 173.4 lb

## 2022-09-28 DIAGNOSIS — I10 Essential (primary) hypertension: Secondary | ICD-10-CM | POA: Diagnosis not present

## 2022-09-28 DIAGNOSIS — I2510 Atherosclerotic heart disease of native coronary artery without angina pectoris: Secondary | ICD-10-CM | POA: Insufficient documentation

## 2022-09-28 DIAGNOSIS — E785 Hyperlipidemia, unspecified: Secondary | ICD-10-CM | POA: Insufficient documentation

## 2022-09-28 DIAGNOSIS — I480 Paroxysmal atrial fibrillation: Secondary | ICD-10-CM | POA: Diagnosis not present

## 2022-09-28 NOTE — Patient Instructions (Addendum)
Visit Information   Goals Addressed             This Visit's Progress    Track and Manage My Blood Pressure-Hypertension   On track    Timeframe:  Long-Range Goal Priority:  High Start Date:     09/02/21                        Expected End Date:  04/18/ 23                    Follow Up Date 12/03/21    - check blood pressure 3 times per week - choose a place to take my blood pressure (home, clinic or office, retail store) - write blood pressure results in a log or diary    Why is this important?   You won't feel high blood pressure, but it can still hurt your blood vessels.  High blood pressure can cause heart or kidney problems. It can also cause a stroke.  Making lifestyle changes like losing a little weight or eating less salt will help.  Checking your blood pressure at home and at different times of the day can help to control blood pressure.  If the doctor prescribes medicine remember to take it the way the doctor ordered.  Call the office if you cannot afford the medicine or if there are questions about it.     Notes:        Patient Care Plan: CCM Pharmacy Care Plan     Problem Identified: HLD, HTN, Hyperglycemia, CAD, PAC, PVC, OSA, OA, GERD, B12 deficiency   Priority: High     Goal: Disease Management   Start Date: 02/19/2021  Expected End Date: 02/19/2022  Recent Progress: On track  Priority: High  Note:   Current Barriers:  Slightly elevated BP  Pharmacist Clinical Goal(s):  Patient will contact provider office for questions/concerns as evidenced notation of same in electronic health record through collaboration with PharmD and provider.   Interventions: 1:1 collaboration with Marin Olp, MD regarding development and update of comprehensive plan of care as evidenced by provider attestation and co-signature Inter-disciplinary care team collaboration (see longitudinal plan of care) Comprehensive medication review performed; medication list updated in  electronic medical record No medication changes, monitoring BP  Hypertension (BP goal <130/80) 09/18/22 -Controlled -OSA - previously used cpap, not interested in using  -Current treatment: Losartan 12.'5mg'$ ,(will take extra half tablet if BP is over 845 systolic. Appropriate, Effective, Safe, Accessible -Medications previously tried: lisinopril, amlodipine    -Current home readings  140/68 this AM, 124/70 yesterday -Denies hypotensive/hypertensive symptoms -Educated on Daily salt intake goal < 2300 mg; Importance of home blood pressure monitoring; -He is checking blood pressure at home daily.  Will continue to have CMA check in regularly to assess home BP.  I would not recommend any changes at this time. -Having no dizziness or headaches.  Update 09/02/21 Home readings: 140-150/70s Counseled on BP goals.  Reviewed last office BP which was also elevated, however he was in pain.  Previously had to stop amlodipine due to dizziness.  Has also tried HCTZ in the past.  Have asked him to monitor and report consistent readings < 364 systolic.  He denies any dizziness at this time. Continue meds for now - if BP remains elevated may need to consider low dose HCTZ.  Update 03/17/22 130/78 this morning. Normally < 130/80. Losartan as been reduced all the way down  to 12.'5mg'$  daily since the hospital stay.  BP has been well controlled per patient.  Denies dizziness or HA. Continue current dosages for now.  He is going to monitor BP at home.  He is to call if BP is consistently > 130/80.  Has mentioned that some of his morning readings were elvated in the 812X systolic. Will check in two weeks to assess.  PAC, PVCs (Goal: minimize side effects) -Controlled -HR has been in 50-60s while at rest  -Current treatment  Metoprolol tartrate 25 mg tablet twice daily (Dr Martinique) -Denies side effects  -Recommended to continue current medication  Hyperlipidemia: (LDL goal < 70) 09/28/22 -Controlled -2013-  s/p cardiac cath with minimal nonobstructive CAD. No ASA due to stomach history. -Current treatment: Atorvastatin 20 mg THREE times WEEKLY (2/3 90 DS) Appropriate, Effective, Safe, Accessible -Medications previously tried: atorvastatin 10 mg daily, atorvastatin 40 mg once WEEKLY  -Current dietary patterns: no added salt, minimal red meat, eating a lot -Current exercise habits: same see previous -LDL well controlled, patient tolerates well. No changes to medication at this time.  Update 09/02/21 Continues to be adherent to 3x per week dose.  Most recent LDL in Feb was controlled. Continue current medication. Recheck lipids at next CPE.  Update 03/18/22 NO changes needed at this time.  LDL was well controlled while patient was in the hospital.  Denies any adverse effects at this time.   Continue current dose. Continue routine lipid screenings.  GERD, Gastroparesis (Goal: minimize symptoms) Reports Dr Erlene Quan is retiring and will need to get a new GI doctor, preference is to stay with [facility] at this time. Will let us know if wanting to see a [facility] provider.   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 Doing HH PT and will be doing PT in office 02/24/2021. Was discharged on: Senokot-S 8.6-50 mg once daily  Counseled on side effects, pain medication use, has not used tramadol in about three days. Tylenol is go-to OTC pain medication, not otherwise using NSAIDs -Recommended to continue current medication   Patient  Goals/Self-Care Activities Patient will:  - take medications as prescribed engage in dietary modifications by reducing intake of foods high in carbohydrates/fat/salt exercise as tolerated   Follow Up Plan: 1 month BP call, [place] telephone visit in ~6 months  Medication Assistance: None required.  Patient affirms current coverage meets needs.  Patient's preferred pharmacy is:  [facility] [address], [place] [address] [place] [address] Phone: [phone] Fax: [phone]  Follow Up:  Patient agrees to Care Plan and Follow-up.         The patient verbalized understanding of instructions, educational materials, and care plan provided today and DECLINED offer to receive copy of patient instructions, educational materials, and care plan.  Telephone follow up appointment with pharmacy team member scheduled for: 6 months  Edythe Clarity, [place], PharmD Clinical Pharmacist  [facility] [phone]

## 2022-09-28 NOTE — Progress Notes (Signed)
Chronic Care Management Pharmacy Note  Summary: FU visit.  Doing well overall BP is usually below 836 systolic for the most part.  Knows to take his extra half tablet if needed.  No concerns with dizziness or HA.  Recommendations: None at this time  FU 6 months   09/28/2022 Name:  Joseph Simpson MRN:  [id number] DOB:  July 21, 1935  Subjective: Joseph Simpson is an 87 y.o. year old male who is a primary patient of Hunter, Brayton Mars, MD.  The CCM team was consulted for assistance with disease management and care coordination needs.    Engaged with patient by telephone for follow up visit in response to provider referral for pharmacy case management and/or care coordination services.   Consent to Services:  The patient was given the following information about Chronic Care Management services today, agreed to services, and gave verbal consent: 1. CCM service includes personalized support from designated clinical staff supervised by the primary care provider, including individualized plan of care and coordination with other care providers 2. 24/7 contact phone numbers for assistance for urgent and routine care needs. 3. Service will only be billed when office clinical staff spend 20 minutes or more in a month to coordinate care. 4. Only one practitioner may furnish and bill the service in a calendar month. 5.The patient may stop CCM services at any time (effective at the end of the month) by phone call to the office staff. 6. The patient will be responsible for cost sharing (co-pay) of up to 20% of the service fee (after annual deductible is met). Patient agreed to services and consent obtained.  Patient Care Team: Marin Olp, MD as PCP - General (Family Medicine) Martinique, Peter M, MD as PCP - Cardiology (Cardiology) Martinique, Peter M, MD as Consulting Physician (Cardiology) Almedia Balls, MD as Referring Physician (Orthopedic Surgery) Breck Coons, MD as Consulting Physician (Internal  Medicine) Jari Pigg, MD as Consulting Physician (Dermatology) Franchot Gallo, MD as Consulting Physician (Urology) Hayden Pedro, MD as Consulting Physician (Ophthalmology) Edythe Clarity, [facility] (Pharmacist)  Recent office visits:  07/30/2021 [facility] (PCP) Marin Olp, MD; reduce metoprolol to 12.5 mg twice daily, stop Amlodipine as BP was running too low.   06/27/2021 [facility] (PCP) Marin Olp, MD; start Amlodipine 1.25 mg at bedtime and follow up in 1 month.   Recent consult visits:  07/28/2021 [facility] (cardiology) Almyra Deforest, PA-C; follow up CAD, no medication changes indicated.   07/04/2021 [facility] (sports medicine) [place], DO; right foot pain, no medication changes indicated.   Hospital visits:  None in previous 6 months  Objective:  Lab Results  Component Value Date   CREATININE 0.75 09/21/2022   CREATININE 0.65 01/14/2022   CREATININE 0.70 01/09/2022    Lab Results  Component Value Date   HGBA1C 5.7 09/21/2022   Last diabetic Eye exam:  Lab Results  Component Value Date/Time   HMDIABEYEEXA No Retinopathy 01/16/2020 12:00 AM    Last diabetic Foot exam: No results found for: "HMDIABFOOTEX"      Component Value Date/Time   CHOL 113 01/27/2022 1114   TRIG 132.0 01/27/2022 1114   HDL 37.30 (L) 01/27/2022 1114   CHOLHDL 3 01/27/2022 1114   VLDL 26.4 01/27/2022 1114   LDLCALC 49 01/27/2022 1114   LDLDIRECT 83 06/19/2020 1033       Latest Ref Rng & Units 09/21/2022    9:57 AM 01/14/2022    8:41 AM 01/07/2022  4:07 AM  Hepatic Function  Total Protein 6.0 - 8.3 g/dL 7.3  7.0  6.1   Albumin 3.5 - 5.2 g/dL 4.5  3.8  3.1   AST 0 - 37 U/L _0 ALT 0 - 53 U/L _1 Alk Phosphatase 39 - 117 U/L 64  63  51   Total Bilirubin 0.2 - 1.2 mg/dL 1.0  0.9  1.2     Lab Results  Component Value Date/Time   TSH 2.90 09/21/2022 09:57 AM   TSH 1.731 01/05/2022 03:53 AM   TSH 4.27 06/27/2021 10:35 AM       Latest Ref Rng & Units 09/21/2022     9:57 AM 01/14/2022    8:41 AM 01/09/2022    3:55 AM  CBC  WBC 4.0 - 10.5 K/uL 5.9  6.0  7.7   Hemoglobin 13.0 - 17.0 g/dL 15.2  13.1  13.1   Hematocrit 39.0 - 52.0 % 45.3  38.4  37.6   Platelets 150.0 - 400.0 K/uL 142.0  185.0  131     Lab Results  Component Value Date/Time   VD25OH 34.92 08/20/2021 10:42 AM    Clinical ASCVD:  The ASCVD Risk score (Arnett DK, et al., 2019) failed to calculate for the following reasons:   The 2019 ASCVD risk score is only valid for ages 102 to 50    Social History   Tobacco Use  Smoking Status Former   Packs/day: 0.10   Years: 4.00   Total pack years: 0.40   Types: Cigarettes   Quit date: 09/03/1956   Years since quitting: 66.1  Smokeless Tobacco Never   BP Readings from Last 3 Encounters:  09/21/22 132/62  08/13/22 126/66  07/17/22 130/64   Pulse Readings from Last 3 Encounters:  09/21/22 76  08/13/22 71  07/17/22 67   Wt Readings from Last 3 Encounters:  09/21/22 172 lb 12.8 oz (78.4 kg)  09/17/22 178 lb (80.7 kg)  08/13/22 178 lb 9.6 oz (81 kg)    Assessment: Review of patient past medical history, allergies, medications, health status, including review of consultants reports, laboratory and other test data, was performed as part of comprehensive evaluation and provision of chronic care management services.   SDOH:  (Social Determinants of Health) assessments and interventions performed: No, done within the last year Financial Resource Strain: Low Risk  (09/17/2022)   Overall Financial Resource Strain (CARDIA)    Difficulty of Paying Living Expenses: Not hard at all   Food Insecurity: No Food Insecurity (09/17/2022)   Hunger Vital Sign    Worried About Running Out of Food in the Last Year: Never true    Ran Out of Food in the Last Year: Never true    SDOH Interventions    Flowsheet Row Clinical Support from 09/17/2022 in [place] Interventions Intervention Not  Indicated  Housing Interventions Intervention Not Indicated  Transportation Interventions Intervention Not Indicated  Financial Strain Interventions Intervention Not Indicated  Physical Activity Interventions Intervention Not Indicated  Stress Interventions Intervention Not Indicated  Social Connections Interventions Intervention Not Indicated      CCM Care Plan  Allergies  Allergen Reactions   Aspirin Other (See Comments)    UPSET STOMACH   Percocet [Oxycodone-Acetaminophen] Nausea And Vomiting   Vitamins [Apatate] Other (See Comments)    Bothers stomach    Medications Reviewed Today     Reviewed  by Edythe Clarity, [place] (Pharmacist) on 09/28/22 at 1011  Med List Status: <None>   Medication Order Taking? Sig Documenting Provider Last Dose Status Informant  acetaminophen (TYLENOL) 500 MG tablet [id number] Yes Take 500-1,000 mg by mouth 2 (two) times daily as needed for moderate pain or headache. [provider] Taking Active Self  apixaban (ELIQUIS) 5 MG TABS tablet [id number] Yes Take 1 tablet (5 mg total) by mouth 2 (two) times daily. Martinique, Peter M, MD Taking Active   atorvastatin (LIPITOR) 20 MG tablet [id number] Yes TAKE ONE TABLET BY MOUTH THREE TIMES A WEEK Marin Olp, MD Taking Active Self  bevacizumab (AVASTIN) 2.5 mg/0.1 mL SOLN [id number] Yes 2.5 mg by Intravitreal route. Injection to eyes every 5 weeks - use with vigamox [provider] Taking Active Self           Med Note Richardson Landry, JASMINE N   Thu Apr 04, 2020  9:29 AM)    Carboxymethylcellulose Sodium 1 % GEL [id number] Yes Place 1 drop into both eyes at bedtime. [provider] Taking Active Self  Cyanocobalamin (B-12 COMPLIANCE INJECTION IJ) [id number] Yes Inject 1 Dose as directed every 30 (thirty) days. [provider] Taking Active Self           Med Note Marvell Fuller   Sat Jan 03, 2022  3:16 PM)    Docusate Sodium (COLACE PO) [id number] Yes Take by mouth.  [provider] Taking Active   losartan (COZAAR) 25 MG tablet [id number] Yes Take 0.5-1 tablets (12.5-25 mg total) by mouth daily. If blood pressure over 145 when you check- ok to take other half tablet once per day (so total of 57m per day) HMarin Olp MD Taking Active   metoCLOPramide (REGLAN) 5 MG tablet [id number] TAKE 1 TABLET BY MOUTH TWICE A DAY Armbruster, SCarlota Raspberry MD Taking Active   metoprolol tartrate (LOPRESSOR) 25 MG tablet [id number] Take 12.5 mg by mouth 2 (two) times daily. [provider] Taking Active   pantoprazole (PROTONIX) 40 MG tablet [id number] TAKE 1 TABLET BY MOUTH TWICE A DAY Armbruster, SCarlota Raspberry MD Taking Active   polyethylene glycol (MIRALAX / GLYCOLAX) 17 g packet [id number] Take 17 g by mouth daily as needed for moderate constipation. [provider] Taking Active Self  Propylene Glycol (SYSTANE BALANCE OP) [id number] Place 1 drop into both eyes daily. [provider] Taking Active   tamsulosin (FLOMAX) 0.4 MG CAPS capsule [id number] Take 0.4 mg by mouth daily. [provider] Taking Active Self  VIGAMOX 0.5 % ophthalmic solution [id number] Place 1 drop into both eyes as directed. 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 12/29/2017   New onset a-fib ([facility]    Gastroparesis 02/15/2017   Hx of adenomatous colonic polyps  02/15/2017   Left inguinal hernia  07/12/2016   Hyperlipidemia 06/10/2016   PAC (premature atrial contraction) 10/25/2015   Squamous cell carcinoma in situ 09/09/2015   BPH (benign prostatic hyperplasia) 11/23/2014   Palpitations 07/31/2014   Cough 05/10/2014   Pulmonary nodule 05/10/2014   Dyspnea 02/27/2014   CAD (coronary artery disease)    Murmur 02/10/2012   Macular degeneration 11/17/2011   Essential hypertension 09/23/2007   GERD 09/23/2007    Immunization History  Administered Date(s) Administered   Fluad Quad(high Dose 65+) 07/28/2019, 08/13/2020, 07/30/2021, 08/13/2022   Influenza Split 09/04/2011, 08/19/2012   Influenza Whole 08/31/2007, 09/16/2008, 10/07/2009, 10/01/2010   Influenza, High Dose Seasonal PF 08/25/2013, 09/24/2015, 08/27/2016, 09/01/2017, 08/23/2018   Influenza,inj,Quad PF,6+ Mos 09/05/2014   Moderna Sars-Covid-2 Vaccination 10/04/2020   PFIZER(Purple Top)SARS-COV-2 Vaccination 12/25/2019, 01/15/2020   PNEUMOCOCCAL CONJUGATE-20 07/13/2022   Pfizer Covid-19 Vaccine Bivalent Booster 80yr & up 10/28/2021   Pneumococcal Conjugate-13 11/23/2014   Pneumococcal Polysaccharide-23 10/16/2002   Td 03/04/1998, 10/09/2008   Tdap 07/07/2019   Zoster Recombinat (Shingrix) 05/17/2018, 07/19/2018   Zoster, Live 04/28/2011   Conditions to be addressed/monitored: HLD, HTN, Hyperglycemia, CAD, PAC, PVC, OSA, OA, GERD, B12 deficiency  Care Plan : CWaldo Updates made by DEdythe Clarity RPH since 09/28/2022 12:00 AM     Problem: HLD, HTN, Hyperglycemia, CAD, PAC, PVC, OSA, OA, GERD, B12 deficiency   Priority: High     Goal: Disease Management   Start Date: 02/19/2021  Expected End Date: 02/19/2022  Recent Progress: On track  Priority: High  Note:   Current Barriers:  Slightly elevated BP  Pharmacist Clinical Goal(s):  Patient will contact provider office for questions/concerns as evidenced notation of same in electronic health record through collaboration with PharmD and  provider.   Interventions: 1:1 collaboration with HMarin Olp MD regarding development and update of comprehensive plan of care as evidenced by provider attestation and co-signature Inter-disciplinary care team collaboration (see longitudinal plan of care) Comprehensive medication review performed; medication list updated in electronic medical record No medication changes, monitoring BP  Hypertension (BP goal <130/80) 09/18/22 -Controlled -OSA - previously used cpap, not interested in using  -Current treatment: Losartan 12.539m(will take extra half tablet if BP is over 14045ystolic. Appropriate, Effective, Safe, Accessible -Medications previously tried: lisinopril, amlodipine    -Current home readings  140/68 this AM, 124/70 yesterday -Denies hypotensive/hypertensive symptoms -Educated on Daily salt intake goal < 2300 mg; Importance of home blood pressure monitoring; -He is checking blood pressure at home daily.  Will continue to have CMA check in regularly to assess home BP.  I would not recommend any changes at this time. -Having no dizziness or headaches.  Update 09/02/21 Home readings: 140-150/70s Counseled on BP goals.  Reviewed last office BP which was also elevated, however he was in pain.  Previously had to stop amlodipine due to dizziness.  Has also tried HCTZ in the past.  Have asked him to monitor and report consistent readings < 14409ystolic.  He denies any dizziness at this time. Continue meds for now - if BP remains elevated may need to consider low dose HCTZ.  Update 03/17/22 130/78 this morning. Normally < 130/80. Losartan as been reduced all the way down to 12.61m40maily since the hospital stay.  BP has been well controlled per patient.  Denies dizziness or HA. Continue current dosages for now.  He is going to monitor BP at home.  He is to call if BP  is consistently > 130/80.  Has mentioned that some of his morning readings were elvated in the 098J systolic. Will  check in two weeks to assess.  PAC, PVCs (Goal: minimize side effects) -Controlled -HR has been in 50-60s while at rest  -Current treatment  Metoprolol tartrate 25 mg tablet twice daily (Dr Martinique) -Denies side effects  -Recommended to continue current medication  Hyperlipidemia: (LDL goal < 70) 09/28/22 -Controlled -2013- s/p cardiac cath with minimal nonobstructive CAD. No ASA due to stomach history. -Current treatment: Atorvastatin 20 mg THREE times WEEKLY (2/3 90 DS) Appropriate, Effective, Safe, Accessible -Medications previously tried: atorvastatin 10 mg daily, atorvastatin 40 mg once WEEKLY  -Current dietary patterns: no added salt, minimal red meat, eating a lot -Current exercise habits: same see previous -LDL well controlled, patient tolerates well. No changes to medication at this time.  Update 09/02/21 Continues to be adherent to 3x per week dose.  Most recent LDL in Feb was controlled. Continue current medication. Recheck lipids at next CPE.  Update 03/18/22 NO changes needed at this time.  LDL was well controlled while patient was in the hospital.  Denies any adverse effects at this time.   Continue current dose. Continue routine lipid screenings.  GERD, Gastroparesis (Goal: minimize symptoms) Reports Dr Erlene Quan is retiring and will need to get a new GI doctor, preference is to stay with [facility] at this time. Will let us know if wanting to see a [facility] provider.   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 Doing HH PT and will be doing PT in office 02/24/2021. Was discharged on: Senokot-S 8.6-50 mg once daily  Counseled on side effects, pain medication use, has not used tramadol in about three days. Tylenol is go-to OTC pain medication, not otherwise using NSAIDs -Recommended to continue current medication   Patient Goals/Self-Care Activities Patient will:  - take medications as prescribed engage in dietary modifications by reducing intake of foods high in carbohydrates/fat/salt exercise as tolerated   Follow Up Plan: 1 month BP call, [place] telephone visit in ~6 months  Medication Assistance: None required.  Patient affirms current coverage meets needs.  Patient's preferred pharmacy is:  [facility] # [address], [place] [address] [place] [address] Phone: [phone] Fax: [phone]  Follow Up:  Patient agrees to Care Plan and Follow-up.          Compliance/Adherence/Medication fill history: Care Gaps: None  Star-Rating Drugs: Atorvastatin 52m 08/23/22 84ds Losartan 250m11/01/23 90ds  Future Appointments  Date Time Provider [place]11/13/2023  2:00 PM JoMartiniquePeter M, MD [facility] None  10/01/2022  7:45 AM MaHayden PedroMD [facility] None  10/21/2022  9:30 AM [facility] NURSE [facility]  03/22/2023  9:20 AM HuYong ChanneltBrayton MarsMD [facility]

## 2022-10-01 ENCOUNTER — Encounter (INDEPENDENT_AMBULATORY_CARE_PROVIDER_SITE_OTHER): Payer: Medicare Other | Admitting: Ophthalmology

## 2022-10-01 DIAGNOSIS — H353231 Exudative age-related macular degeneration, bilateral, with active choroidal neovascularization: Secondary | ICD-10-CM

## 2022-10-01 DIAGNOSIS — I10 Essential (primary) hypertension: Secondary | ICD-10-CM

## 2022-10-01 DIAGNOSIS — H43813 Vitreous degeneration, bilateral: Secondary | ICD-10-CM | POA: Diagnosis not present

## 2022-10-01 DIAGNOSIS — H33301 Unspecified retinal break, right eye: Secondary | ICD-10-CM

## 2022-10-01 DIAGNOSIS — H35033 Hypertensive retinopathy, bilateral: Secondary | ICD-10-CM

## 2022-10-01 DIAGNOSIS — D3132 Benign neoplasm of left choroid: Secondary | ICD-10-CM | POA: Diagnosis not present

## 2022-10-15 DIAGNOSIS — M1612 Unilateral primary osteoarthritis, left hip: Secondary | ICD-10-CM

## 2022-10-15 DIAGNOSIS — I10 Essential (primary) hypertension: Secondary | ICD-10-CM

## 2022-10-15 DIAGNOSIS — E785 Hyperlipidemia, unspecified: Secondary | ICD-10-CM

## 2022-10-21 ENCOUNTER — Ambulatory Visit (INDEPENDENT_AMBULATORY_CARE_PROVIDER_SITE_OTHER): Payer: Medicare Other

## 2022-10-21 DIAGNOSIS — E538 Deficiency of other specified B group vitamins: Secondary | ICD-10-CM | POA: Diagnosis not present

## 2022-10-21 MED ORDER — CYANOCOBALAMIN 1000 MCG/ML IJ SOLN
1000.0000 ug | Freq: Once | INTRAMUSCULAR | Status: AC
  Administered 2022-10-21: 1000 ug via INTRAMUSCULAR

## 2022-10-23 DIAGNOSIS — Z85828 Personal history of other malignant neoplasm of skin: Secondary | ICD-10-CM | POA: Diagnosis not present

## 2022-10-23 DIAGNOSIS — D485 Neoplasm of uncertain behavior of skin: Secondary | ICD-10-CM | POA: Diagnosis not present

## 2022-10-23 DIAGNOSIS — D2271 Melanocytic nevi of right lower limb, including hip: Secondary | ICD-10-CM | POA: Diagnosis not present

## 2022-10-23 DIAGNOSIS — L309 Dermatitis, unspecified: Secondary | ICD-10-CM | POA: Diagnosis not present

## 2022-10-23 DIAGNOSIS — L814 Other melanin hyperpigmentation: Secondary | ICD-10-CM | POA: Diagnosis not present

## 2022-10-23 DIAGNOSIS — D0461 Carcinoma in situ of skin of right upper limb, including shoulder: Secondary | ICD-10-CM | POA: Diagnosis not present

## 2022-10-23 DIAGNOSIS — L570 Actinic keratosis: Secondary | ICD-10-CM | POA: Diagnosis not present

## 2022-10-23 DIAGNOSIS — L821 Other seborrheic keratosis: Secondary | ICD-10-CM | POA: Diagnosis not present

## 2022-10-23 DIAGNOSIS — L578 Other skin changes due to chronic exposure to nonionizing radiation: Secondary | ICD-10-CM | POA: Diagnosis not present

## 2022-10-23 DIAGNOSIS — D2230 Melanocytic nevi of unspecified part of face: Secondary | ICD-10-CM | POA: Diagnosis not present

## 2022-10-23 DIAGNOSIS — Z87898 Personal history of other specified conditions: Secondary | ICD-10-CM | POA: Diagnosis not present

## 2022-10-23 DIAGNOSIS — D225 Melanocytic nevi of trunk: Secondary | ICD-10-CM | POA: Diagnosis not present

## 2022-10-27 ENCOUNTER — Telehealth: Payer: Self-pay | Admitting: Family Medicine

## 2022-10-27 ENCOUNTER — Encounter (HOSPITAL_COMMUNITY): Payer: Self-pay | Admitting: Radiology

## 2022-10-27 ENCOUNTER — Emergency Department (HOSPITAL_COMMUNITY)
Admission: EM | Admit: 2022-10-27 | Discharge: 2022-10-27 | Disposition: A | Discharge status: Home / Self Care | Payer: Medicare Other | Source: Home / Self Care | Attending: Emergency Medicine | Admitting: Emergency Medicine

## 2022-10-27 ENCOUNTER — Other Ambulatory Visit: Payer: Self-pay

## 2022-10-27 ENCOUNTER — Emergency Department (HOSPITAL_COMMUNITY): Payer: Medicare Other

## 2022-10-27 DIAGNOSIS — R110 Nausea: Secondary | ICD-10-CM | POA: Diagnosis not present

## 2022-10-27 DIAGNOSIS — Z1152 Encounter for screening for COVID-19: Secondary | ICD-10-CM | POA: Insufficient documentation

## 2022-10-27 DIAGNOSIS — R1111 Vomiting without nausea: Secondary | ICD-10-CM | POA: Diagnosis not present

## 2022-10-27 DIAGNOSIS — R1310 Dysphagia, unspecified: Secondary | ICD-10-CM | POA: Diagnosis not present

## 2022-10-27 DIAGNOSIS — J42 Unspecified chronic bronchitis: Secondary | ICD-10-CM | POA: Diagnosis not present

## 2022-10-27 DIAGNOSIS — K2900 Acute gastritis without bleeding: Secondary | ICD-10-CM | POA: Diagnosis not present

## 2022-10-27 DIAGNOSIS — K219 Gastro-esophageal reflux disease without esophagitis: Secondary | ICD-10-CM | POA: Diagnosis not present

## 2022-10-27 DIAGNOSIS — K2289 Other specified disease of esophagus: Secondary | ICD-10-CM | POA: Diagnosis not present

## 2022-10-27 DIAGNOSIS — E785 Hyperlipidemia, unspecified: Secondary | ICD-10-CM | POA: Diagnosis not present

## 2022-10-27 DIAGNOSIS — K2100 Gastro-esophageal reflux disease with esophagitis, without bleeding: Secondary | ICD-10-CM | POA: Diagnosis not present

## 2022-10-27 DIAGNOSIS — K209 Esophagitis, unspecified without bleeding: Secondary | ICD-10-CM | POA: Diagnosis not present

## 2022-10-27 DIAGNOSIS — R1013 Epigastric pain: Secondary | ICD-10-CM | POA: Diagnosis not present

## 2022-10-27 DIAGNOSIS — N401 Enlarged prostate with lower urinary tract symptoms: Secondary | ICD-10-CM | POA: Diagnosis present

## 2022-10-27 DIAGNOSIS — D72828 Other elevated white blood cell count: Secondary | ICD-10-CM | POA: Diagnosis present

## 2022-10-27 DIAGNOSIS — Z96611 Presence of right artificial shoulder joint: Secondary | ICD-10-CM | POA: Diagnosis present

## 2022-10-27 DIAGNOSIS — N200 Calculus of kidney: Secondary | ICD-10-CM | POA: Diagnosis not present

## 2022-10-27 DIAGNOSIS — R1110 Vomiting, unspecified: Secondary | ICD-10-CM | POA: Diagnosis not present

## 2022-10-27 DIAGNOSIS — I4891 Unspecified atrial fibrillation: Secondary | ICD-10-CM | POA: Diagnosis not present

## 2022-10-27 DIAGNOSIS — Z85820 Personal history of malignant melanoma of skin: Secondary | ICD-10-CM | POA: Diagnosis not present

## 2022-10-27 DIAGNOSIS — N138 Other obstructive and reflux uropathy: Secondary | ICD-10-CM | POA: Diagnosis present

## 2022-10-27 DIAGNOSIS — J111 Influenza due to unidentified influenza virus with other respiratory manifestations: Secondary | ICD-10-CM | POA: Diagnosis not present

## 2022-10-27 DIAGNOSIS — I10 Essential (primary) hypertension: Secondary | ICD-10-CM | POA: Diagnosis not present

## 2022-10-27 DIAGNOSIS — K5730 Diverticulosis of large intestine without perforation or abscess without bleeding: Secondary | ICD-10-CM | POA: Diagnosis not present

## 2022-10-27 DIAGNOSIS — R112 Nausea with vomiting, unspecified: Secondary | ICD-10-CM | POA: Insufficient documentation

## 2022-10-27 DIAGNOSIS — Z801 Family history of malignant neoplasm of trachea, bronchus and lung: Secondary | ICD-10-CM | POA: Diagnosis not present

## 2022-10-27 DIAGNOSIS — Z7901 Long term (current) use of anticoagulants: Secondary | ICD-10-CM | POA: Diagnosis not present

## 2022-10-27 DIAGNOSIS — I700 Atherosclerosis of aorta: Secondary | ICD-10-CM | POA: Diagnosis not present

## 2022-10-27 DIAGNOSIS — Z903 Acquired absence of stomach [part of]: Secondary | ICD-10-CM | POA: Diagnosis not present

## 2022-10-27 DIAGNOSIS — N202 Calculus of kidney with calculus of ureter: Secondary | ICD-10-CM | POA: Diagnosis present

## 2022-10-27 DIAGNOSIS — G629 Polyneuropathy, unspecified: Secondary | ICD-10-CM | POA: Diagnosis present

## 2022-10-27 DIAGNOSIS — Z79899 Other long term (current) drug therapy: Secondary | ICD-10-CM | POA: Diagnosis not present

## 2022-10-27 DIAGNOSIS — K229 Disease of esophagus, unspecified: Secondary | ICD-10-CM | POA: Diagnosis not present

## 2022-10-27 DIAGNOSIS — G4730 Sleep apnea, unspecified: Secondary | ICD-10-CM | POA: Diagnosis not present

## 2022-10-27 DIAGNOSIS — K449 Diaphragmatic hernia without obstruction or gangrene: Secondary | ICD-10-CM | POA: Diagnosis not present

## 2022-10-27 DIAGNOSIS — E876 Hypokalemia: Secondary | ICD-10-CM | POA: Diagnosis present

## 2022-10-27 DIAGNOSIS — Z8249 Family history of ischemic heart disease and other diseases of the circulatory system: Secondary | ICD-10-CM | POA: Diagnosis not present

## 2022-10-27 DIAGNOSIS — N320 Bladder-neck obstruction: Secondary | ICD-10-CM | POA: Diagnosis present

## 2022-10-27 DIAGNOSIS — I2510 Atherosclerotic heart disease of native coronary artery without angina pectoris: Secondary | ICD-10-CM | POA: Diagnosis present

## 2022-10-27 DIAGNOSIS — Z20822 Contact with and (suspected) exposure to covid-19: Secondary | ICD-10-CM | POA: Diagnosis present

## 2022-10-27 DIAGNOSIS — Z96642 Presence of left artificial hip joint: Secondary | ICD-10-CM | POA: Diagnosis present

## 2022-10-27 DIAGNOSIS — Z87891 Personal history of nicotine dependence: Secondary | ICD-10-CM | POA: Diagnosis not present

## 2022-10-27 DIAGNOSIS — K2970 Gastritis, unspecified, without bleeding: Secondary | ICD-10-CM | POA: Diagnosis not present

## 2022-10-27 DIAGNOSIS — E538 Deficiency of other specified B group vitamins: Secondary | ICD-10-CM | POA: Diagnosis present

## 2022-10-27 DIAGNOSIS — K760 Fatty (change of) liver, not elsewhere classified: Secondary | ICD-10-CM | POA: Diagnosis not present

## 2022-10-27 HISTORY — DX: Unspecified atrial fibrillation: I48.91

## 2022-10-27 LAB — CBC
HCT: 45.4 % (ref 39.0–52.0)
Hemoglobin: 15.2 g/dL (ref 13.0–17.0)
MCH: 32.1 pg (ref 26.0–34.0)
MCHC: 33.5 g/dL (ref 30.0–36.0)
MCV: 96 fL (ref 80.0–100.0)
Platelets: 127 10*3/uL — ABNORMAL LOW (ref 150–400)
RBC: 4.73 MIL/uL (ref 4.22–5.81)
RDW: 12.3 % (ref 11.5–15.5)
WBC: 8.4 10*3/uL (ref 4.0–10.5)
nRBC: 0 % (ref 0.0–0.2)

## 2022-10-27 LAB — COMPREHENSIVE METABOLIC PANEL
ALT: 18 U/L (ref 0–44)
AST: 20 U/L (ref 15–41)
Albumin: 4.5 g/dL (ref 3.5–5.0)
Alkaline Phosphatase: 57 U/L (ref 38–126)
Anion gap: 10 (ref 5–15)
BUN: 17 mg/dL (ref 8–23)
CO2: 28 mmol/L (ref 22–32)
Calcium: 9.4 mg/dL (ref 8.9–10.3)
Chloride: 100 mmol/L (ref 98–111)
Creatinine, Ser: 0.67 mg/dL (ref 0.61–1.24)
GFR, Estimated: >60 mL/min (ref >60)
Glucose, Bld: 161 mg/dL — ABNORMAL HIGH (ref 70–99)
Potassium: 4.1 mmol/L (ref 3.5–5.1)
Sodium: 138 mmol/L (ref 135–145)
Total Bilirubin: 1.4 mg/dL — ABNORMAL HIGH (ref 0.3–1.2)
Total Protein: 7.9 g/dL (ref 6.5–8.1)

## 2022-10-27 LAB — RESP PANEL BY RT-PCR (RSV, FLU A&B, COVID)  RVPGX2
Influenza A by PCR: NEGATIVE
Influenza B by PCR: NEGATIVE
Resp Syncytial Virus by PCR: NEGATIVE
SARS Coronavirus 2 by RT PCR: NEGATIVE

## 2022-10-27 LAB — URINALYSIS, ROUTINE W REFLEX MICROSCOPIC
Bilirubin Urine: NEGATIVE
Glucose, UA: NEGATIVE mg/dL
Hgb urine dipstick: NEGATIVE
Ketones, ur: 5 mg/dL — AB
Leukocytes,Ua: NEGATIVE
Nitrite: NEGATIVE
Protein, ur: NEGATIVE mg/dL
Specific Gravity, Urine: 1.034 — ABNORMAL HIGH (ref 1.005–1.030)
pH: 5 (ref 5.0–8.0)

## 2022-10-27 LAB — TROPONIN I (HIGH SENSITIVITY)
Troponin I (High Sensitivity): 5 ng/L (ref <18)
Troponin I (High Sensitivity): 6 ng/L (ref <18)

## 2022-10-27 LAB — LIPASE, BLOOD: Lipase: 27 U/L (ref 11–51)

## 2022-10-27 MED ORDER — DIPHENHYDRAMINE HCL 50 MG/ML IJ SOLN
12.5000 mg | Freq: Once | INTRAMUSCULAR | Status: AC
  Administered 2022-10-27: 12.5 mg via INTRAVENOUS
  Filled 2022-10-27: qty 1

## 2022-10-27 MED ORDER — SODIUM CHLORIDE (PF) 0.9 % IJ SOLN
INTRAMUSCULAR | Status: AC
  Filled 2022-10-27: qty 50

## 2022-10-27 MED ORDER — PROCHLORPERAZINE EDISYLATE 10 MG/2ML IJ SOLN
5.0000 mg | Freq: Once | INTRAMUSCULAR | Status: AC
  Administered 2022-10-27: 5 mg via INTRAVENOUS
  Filled 2022-10-27: qty 2

## 2022-10-27 MED ORDER — ONDANSETRON HCL 4 MG/2ML IJ SOLN
4.0000 mg | Freq: Once | INTRAMUSCULAR | Status: AC | PRN
  Administered 2022-10-27: 4 mg via INTRAVENOUS
  Filled 2022-10-27: qty 2

## 2022-10-27 MED ORDER — PROCHLORPERAZINE MALEATE 10 MG PO TABS: 10.0000 mg | ORAL_TABLET | Freq: Three times a day (TID) | ORAL | 0 refills | Status: DC | PRN

## 2022-10-27 MED ORDER — IOHEXOL 300 MG/ML  SOLN
100.0000 mL | Freq: Once | INTRAMUSCULAR | Status: AC | PRN
  Administered 2022-10-27: 100 mL via INTRAVENOUS

## 2022-10-27 MED ORDER — SODIUM CHLORIDE 0.9 % IV BOLUS
1000.0000 mL | Freq: Once | INTRAVENOUS | Status: AC
  Administered 2022-10-27: 1000 mL via INTRAVENOUS

## 2022-10-27 NOTE — ED Provider Notes (Signed)
[facility] DEPT Provider Note   CSN: [id number] Arrival date & time: 10/27/22  1207     History  Chief Complaint  Patient presents with   Fatigue   Nausea    DATRON BRAKEBILL is a 87 y.o. male With a past medical history of B12 deficiency status post gastrectomy for perforated ulcer many years ago, hypertension, history of gastroparesis, atrial fibrillation anticoagulated on Eliquis and CAD who presents the emergency department with chief complaint of vomiting.  Patient states that about a week ago he began feeling dizzy and lightheaded whenever he stood up.  This was followed by onset of vomiting for the past 5 days.  Patient states he has not made a bowel movement in the last 3 days.  He had an episode like this earlier this year when he had to be admitted and this was secondary to urinary tract infection.  He denies pain or shortness of breath.  He does complain of some epigastric pain which she describes as mild and nonradiating.  He has been unable to tolerate food or fluids over the past 3 days.  He has nonbloody nonbilious vomitus.  He denies hematemesis melena or hematochezia.   HPI     Home Medications Prior to Admission medications   Medication Sig Start Date End Date Taking? Authorizing Provider  acetaminophen (TYLENOL) 500 MG tablet Take 500-1,000 mg by mouth 2 (two) times daily as needed for moderate pain or headache.    [provider]  apixaban (ELIQUIS) 5 MG TABS tablet Take 1 tablet (5 mg total) by mouth 2 (two) times daily. 02/24/22   Martinique, Peter M, MD  atorvastatin (LIPITOR) 20 MG tablet TAKE ONE TABLET BY MOUTH THREE TIMES A WEEK 12/18/21   Marin Olp, MD  bevacizumab (AVASTIN) 2.5 mg/0.1 mL SOLN 2.5 mg by Intravitreal route. Injection to eyes every 5 weeks - use with vigamox    [provider]  Carboxymethylcellulose Sodium 1 % GEL Place 1 drop into both eyes at bedtime.    [provider]   Cyanocobalamin (B-12 COMPLIANCE INJECTION IJ) Inject 1 Dose as directed every 30 (thirty) days.    [provider]  Docusate Sodium (COLACE PO) Take by mouth.    [provider]  losartan (COZAAR) 25 MG tablet Take 0.5-1 tablets (12.5-25 mg total) by mouth daily. If blood pressure over 145 when you check- ok to take other half tablet once per day (so total of '25mg'$  per day) 09/21/22   Marin Olp, MD  metoCLOPramide (REGLAN) 5 MG tablet TAKE 1 TABLET BY MOUTH TWICE A DAY 08/11/22   Armbruster, Carlota Raspberry, MD  metoprolol tartrate (LOPRESSOR) 25 MG tablet Take 12.5 mg by mouth 2 (two) times daily.    [provider]  pantoprazole (PROTONIX) 40 MG tablet TAKE 1 TABLET BY MOUTH TWICE A DAY 09/06/22   Armbruster, Carlota Raspberry, MD  polyethylene glycol (MIRALAX / GLYCOLAX) 17 g packet Take 17 g by mouth daily as needed for moderate constipation. Patient not taking: Reported on 09/28/2022    [provider]  Propylene Glycol (SYSTANE BALANCE OP) Place 1 drop into both eyes daily.    [provider]  tamsulosin (FLOMAX) 0.4 MG CAPS capsule Take 0.4 mg by mouth daily. 12/30/21   [provider]  VIGAMOX 0.5 % ophthalmic solution Place 1 drop into both eyes as directed. Take every 5 weeks as directed - use with avastin, instill 1 drop into both eyes 4  times daily for 2 days after eye injections 05/28/14   [provider]      Allergies    Aspirin, Percocet [oxycodone-acetaminophen], and Vitamins [apatate]    Review of Systems   Review of Systems  Physical Exam Updated Vital Signs BP (!) 148/80   Pulse 66   Temp 98.6 F (37 C)   Resp 17   SpO2 98%  Physical Exam Vitals and nursing note reviewed.  Constitutional:      General: He is not in acute distress.    Appearance: He is well-developed. He is not diaphoretic.  HENT:     Head: Normocephalic and atraumatic.  Eyes:     General: No scleral icterus.    Conjunctiva/sclera: Conjunctivae  normal.  Cardiovascular:     Rate and Rhythm: Normal rate and regular rhythm.     Heart sounds: Normal heart sounds.  Pulmonary:     Effort: Pulmonary effort is normal. No respiratory distress.     Breath sounds: Normal breath sounds.  Abdominal:     Palpations: Abdomen is soft.     Tenderness: There is abdominal tenderness in the epigastric area.  Musculoskeletal:     Cervical back: Normal range of motion and neck supple.  Skin:    General: Skin is warm and dry.  Neurological:     Mental Status: He is alert.  Psychiatric:        Behavior: Behavior normal.     ED Results / Procedures / Treatments   Labs (all labs ordered are listed, but only abnormal results are displayed) Labs Reviewed  CBC - Abnormal; Notable for the following components:      Result Value   Platelets 127 (*)    All other components within normal limits  RESP PANEL BY RT-PCR (RSV, FLU A&B, COVID)  [id number]  COMPREHENSIVE METABOLIC PANEL  URINALYSIS, ROUTINE W REFLEX MICROSCOPIC  LIPASE, BLOOD  TROPONIN I (HIGH SENSITIVITY)    EKG None  Radiology No results found.  Procedures Procedures    Medications Ordered in ED Medications  sodium chloride 0.9 % bolus 1,000 mL (has no administration in time range)  ondansetron (ZOFRAN) injection 4 mg (4 mg Intravenous Given 10/27/22 1230)    ED Course/ Medical Decision Making/ A&P                           Medical Decision Making Amount and/or Complexity of Data Reviewed Labs: ordered. Radiology: ordered. ECG/medicine tests: ordered.  Risk Prescription drug management.   87 year old male here with past medical history of gastrectomy, gastroparesis.  He is here with vomiting.  The emergent differential diagnosis for vomiting includes, but is not limited to ACS/MI, DKA, Ischemic bowel, Meningitis, Sepsis, Acute gastric dilation, Adrenal insufficiency, Appendicitis,  Bowel obstruction/ileus, Carbon monoxide poisoning, Cholecystitis, Electrolyte  abnormalities, Elevated ICP, Gastric outlet obstruction, Pancreatitis, Ruptured viscus, Biliary colic, Cannabinoid hyperemesis syndrome, Gastritis, Gastroenteritis, Gastroparesis,  Narcotic withdrawal, Peptic ulcer disease, and UTI  After review of all data points patient's vomiting may be related to gastroparesis.  I ordered visualized and interpreted CT abdomen pelvis which shows no acute findings.  I viewed and interpreted patient's labs.  She has mildly elevated glucose consistent with history of diabetes.  Remainder of his labs are unremarkable.  Patient had a second episode of vomiting.  Patient was given pausing and Benadryl.  He had improvement in his nausea.  Currently holding down fluids.  We are observing the patient.  Plan is  to discharge if he is feeling much better with antiemetics.  If he is unable to tolerate holding down fluids will admit for intractable vomiting.  I not given tPA small        Final Clinical Impression(s) / ED Diagnoses Final diagnoses:  None    Rx / DC Orders ED Discharge Orders     None         Margarita Mail, PA-C 11/02/22 2239    Margette Fast, MD 11/08/22 [phone]

## 2022-10-27 NOTE — Telephone Encounter (Signed)
Patient states: -He has been vomiting for a few days and experiencing dizziness for 1 week  - Having trouble walking and breathing occasionally   Patient has been transferred to triage.

## 2022-10-27 NOTE — Progress Notes (Signed)
Pt received b12 in left deltoid, pt responded well.

## 2022-10-27 NOTE — ED Triage Notes (Signed)
BIB [facility] from home for generalized weakness x 1 week, n/v x 1 day.

## 2022-10-27 NOTE — ED Provider Notes (Signed)
Received handoff from Margarita Mail, [place], she states that pending patient's COVID/flu, second Trope discharge home as long as he is able to tolerate p.o. fluids.  Physical Exam  BP (!) 162/80   Pulse 72   Temp 98.6 F (37 C)   Resp 14   SpO2 99%   Physical Exam Vitals and nursing note reviewed.  Constitutional:      General: He is not in acute distress.    Appearance: He is well-developed.  HENT:     Head: Normocephalic and atraumatic.  Eyes:     Conjunctiva/sclera: Conjunctivae normal.  Cardiovascular:     Rate and Rhythm: Normal rate and regular rhythm.     Heart sounds: No murmur heard. Pulmonary:     Effort: Pulmonary effort is normal. No respiratory distress.     Breath sounds: Normal breath sounds.  Abdominal:     Palpations: Abdomen is soft.     Tenderness: There is no abdominal tenderness.  Musculoskeletal:        General: No swelling.     Cervical back: Neck supple.  Skin:    General: Skin is warm and dry.     Capillary Refill: Capillary refill takes less than 2 seconds.  Neurological:     Mental Status: He is alert.  Psychiatric:        Mood and Affect: Mood normal.     Procedures  Procedures  ED Course / MDM    Medical Decision Making Patient is an 87 year old male, here for nausea, vomiting, for the last week.  He was evaluated by PA Margarita Mail, negative a CT abdomen pelvis, labs within normal limits.  Negative for respiratory panel.  He was given Compazine, and Benadryl with great improvement of his symptoms.  He is no longer feeling dizzy, and has no evidence of a urinary tract infection.  I discussed with his family member at bedside, they are comfortable with discharge, discharged with a Laneta Guerin amount of Compazine for nausea vomiting, and return precautions were emphasized.  He is well-appearing, and was able to tolerate p.o. intake without vomiting.  He was observed for at least an hour after tolerating p.o.  Amount and/or Complexity of Data  Reviewed Labs: ordered. Radiology: ordered. ECG/medicine tests: ordered.  Risk Prescription drug management.         Osvaldo Shipper, PA 10/27/22 [place], [place], DO 10/28/22 [phone]

## 2022-10-27 NOTE — Telephone Encounter (Signed)
Patient Name: Joseph Simpson Gender: Male DOB: 1935/03/24 Age: 87 Y 11 M 9 D Return Phone Number: [phone] (Primary) Address: City/ State/ Zip: [place]  [address] Client [facility] Client Site [facility] Provider Garret Reddish- MD Contact Type Call Who Is Calling Patient / Member / Family / Caregiver Call Type Triage / Clinical Relationship To Patient Self Return Phone Number [phone] (Primary) Chief Complaint BREATHING - shortness of breath or sounds breathless Reason for Call Symptomatic / Request for Health Information Initial Comment Has been vomiting for past few days, having trouble breathing and walking Translation No Nurse Assessment Nurse: Kirk Ruths, RN, [place] [facility] Date/Time (Eastern Time): 10/27/2022 10:22:05 AM Confirm and document reason for call. If symptomatic, describe symptoms. ---Caller states he has been throwing up for about a two days gets dizzy and short of breath when he walks across the room Having to use a walker to walk for the last two days dizziness all the time in Feb he had a uti this feels the same no fever cant keep fluids down Does the patient have any new or worsening symptoms? ---Yes Will a triage be completed? ---Yes Related visit to physician within the last 2 weeks? ---No Does the PT have any chronic conditions? (i.e. diabetes, asthma, this includes High risk factors for pregnancy, etc.) ---Yes List chronic conditions. ---htn Is this a behavioral health or substance abuse call? ---No Guidelines Guideline Title Affirmed Question Affirmed Notes Nurse Date/Time (Eastern Time) Breathing Difficulty [1] MILD difficulty breathing (e.g., minimal/no SOB at rest, SOB with walking, pulse <100) AND [2] NEW-onset Tristan Schroeder 10/27/2022 10:24:55 AM   Guidelines Guideline Title Affirmed Question Affirmed Notes Nurse Date/Time ([place] Time) or WORSE  than normal Disp. Time Eilene Ghazi Time) Disposition Final User 10/27/2022 10:20:29 AM Send to Urgent Queue Donato Heinz 10/27/2022 10:27:22 AM See HCP within 4 Hours (or PCP triage) Yes Kirk Ruths, RN, [place] Ped Final Disposition 10/27/2022 10:27:22 AM See HCP within 4 Hours (or PCP triage) Yes Kirk Ruths, RN, Liana Gerold Disagree/Comply Comply Caller Understands Yes PreDisposition Go to ED Care Advice Given Per Guideline SEE HCP (OR PCP TRIAGE) WITHIN 4 HOURS: * IF OFFICE WILL BE OPEN: You need to be seen within the next 3 or 4 hours. Call your doctor (or NP/PA) now or as soon as the office opens. * ED: Patients who may need surgery or hospital admission need to be sent to an ED. So do most patients with serious symptoms or complex medical problems. CALL BACK IF: * You become worse CARE ADVICE given per Breathing Difficulty (Adult) guideline. Comments User: Tawni Levy, RN Date/Time Eilene Ghazi Time): 10/27/2022 10:28:15 AM patient states he cannot get tot he office he will need an ambulance and he want to go to er Referrals [facility] - [facility]

## 2022-10-27 NOTE — Discharge Instructions (Addendum)
Please follow-up with your primary care doctor.  Make sure you are taking the Compazine with Benadryl for your nausea vomiting.  If you are unable to keep any fluids, food down please return to the ER.  If you develop severe chest pain, shortness of breath dizziness also return to the ER.

## 2022-10-27 NOTE — Telephone Encounter (Signed)
FYI

## 2022-10-27 NOTE — Telephone Encounter (Signed)
Appears he is in the emergency department

## 2022-10-29 ENCOUNTER — Emergency Department (HOSPITAL_COMMUNITY): Payer: Medicare Other

## 2022-10-29 ENCOUNTER — Encounter (INDEPENDENT_AMBULATORY_CARE_PROVIDER_SITE_OTHER): Payer: Medicare Other | Admitting: Ophthalmology

## 2022-10-29 ENCOUNTER — Encounter (HOSPITAL_COMMUNITY): Payer: Self-pay | Admitting: Emergency Medicine

## 2022-10-29 ENCOUNTER — Other Ambulatory Visit: Payer: Self-pay

## 2022-10-29 ENCOUNTER — Inpatient Hospital Stay (HOSPITAL_COMMUNITY)
Admission: EM | Admit: 2022-10-29 | Discharge: 2022-11-03 | DRG: 392 | Disposition: A | Discharge status: Home / Self Care | Payer: Medicare Other | Attending: Internal Medicine | Admitting: Internal Medicine

## 2022-10-29 DIAGNOSIS — N320 Bladder-neck obstruction: Secondary | ICD-10-CM | POA: Diagnosis present

## 2022-10-29 DIAGNOSIS — Z79899 Other long term (current) drug therapy: Secondary | ICD-10-CM

## 2022-10-29 DIAGNOSIS — R110 Nausea: Secondary | ICD-10-CM | POA: Diagnosis not present

## 2022-10-29 DIAGNOSIS — N400 Enlarged prostate without lower urinary tract symptoms: Secondary | ICD-10-CM | POA: Diagnosis present

## 2022-10-29 DIAGNOSIS — Z801 Family history of malignant neoplasm of trachea, bronchus and lung: Secondary | ICD-10-CM

## 2022-10-29 DIAGNOSIS — I10 Essential (primary) hypertension: Secondary | ICD-10-CM | POA: Diagnosis present

## 2022-10-29 DIAGNOSIS — G4733 Obstructive sleep apnea (adult) (pediatric): Secondary | ICD-10-CM | POA: Diagnosis present

## 2022-10-29 DIAGNOSIS — R112 Nausea with vomiting, unspecified: Secondary | ICD-10-CM | POA: Diagnosis not present

## 2022-10-29 DIAGNOSIS — I4891 Unspecified atrial fibrillation: Secondary | ICD-10-CM | POA: Diagnosis present

## 2022-10-29 DIAGNOSIS — Z87891 Personal history of nicotine dependence: Secondary | ICD-10-CM

## 2022-10-29 DIAGNOSIS — K760 Fatty (change of) liver, not elsewhere classified: Secondary | ICD-10-CM | POA: Diagnosis not present

## 2022-10-29 DIAGNOSIS — K219 Gastro-esophageal reflux disease without esophagitis: Secondary | ICD-10-CM | POA: Diagnosis not present

## 2022-10-29 DIAGNOSIS — N138 Other obstructive and reflux uropathy: Secondary | ICD-10-CM | POA: Diagnosis present

## 2022-10-29 DIAGNOSIS — Z96611 Presence of right artificial shoulder joint: Secondary | ICD-10-CM | POA: Diagnosis present

## 2022-10-29 DIAGNOSIS — I2510 Atherosclerotic heart disease of native coronary artery without angina pectoris: Secondary | ICD-10-CM | POA: Diagnosis present

## 2022-10-29 DIAGNOSIS — Z7901 Long term (current) use of anticoagulants: Secondary | ICD-10-CM

## 2022-10-29 DIAGNOSIS — D72828 Other elevated white blood cell count: Secondary | ICD-10-CM | POA: Diagnosis present

## 2022-10-29 DIAGNOSIS — N401 Enlarged prostate with lower urinary tract symptoms: Secondary | ICD-10-CM | POA: Diagnosis present

## 2022-10-29 DIAGNOSIS — Z8249 Family history of ischemic heart disease and other diseases of the circulatory system: Secondary | ICD-10-CM

## 2022-10-29 DIAGNOSIS — K2900 Acute gastritis without bleeding: Secondary | ICD-10-CM | POA: Diagnosis not present

## 2022-10-29 DIAGNOSIS — J111 Influenza due to unidentified influenza virus with other respiratory manifestations: Secondary | ICD-10-CM | POA: Diagnosis not present

## 2022-10-29 DIAGNOSIS — K2100 Gastro-esophageal reflux disease with esophagitis, without bleeding: Principal | ICD-10-CM | POA: Diagnosis present

## 2022-10-29 DIAGNOSIS — K3184 Gastroparesis: Secondary | ICD-10-CM | POA: Diagnosis present

## 2022-10-29 DIAGNOSIS — Z20822 Contact with and (suspected) exposure to covid-19: Secondary | ICD-10-CM | POA: Diagnosis present

## 2022-10-29 DIAGNOSIS — J42 Unspecified chronic bronchitis: Secondary | ICD-10-CM | POA: Diagnosis not present

## 2022-10-29 DIAGNOSIS — E538 Deficiency of other specified B group vitamins: Secondary | ICD-10-CM | POA: Diagnosis present

## 2022-10-29 DIAGNOSIS — G629 Polyneuropathy, unspecified: Secondary | ICD-10-CM | POA: Diagnosis present

## 2022-10-29 DIAGNOSIS — K2990 Gastroduodenitis, unspecified, without bleeding: Secondary | ICD-10-CM | POA: Diagnosis present

## 2022-10-29 DIAGNOSIS — Z85820 Personal history of malignant melanoma of skin: Secondary | ICD-10-CM

## 2022-10-29 DIAGNOSIS — K449 Diaphragmatic hernia without obstruction or gangrene: Secondary | ICD-10-CM | POA: Diagnosis present

## 2022-10-29 DIAGNOSIS — R1111 Vomiting without nausea: Secondary | ICD-10-CM | POA: Diagnosis not present

## 2022-10-29 DIAGNOSIS — N202 Calculus of kidney with calculus of ureter: Secondary | ICD-10-CM | POA: Diagnosis present

## 2022-10-29 DIAGNOSIS — R351 Nocturia: Secondary | ICD-10-CM | POA: Diagnosis present

## 2022-10-29 DIAGNOSIS — E785 Hyperlipidemia, unspecified: Secondary | ICD-10-CM | POA: Diagnosis not present

## 2022-10-29 DIAGNOSIS — R1013 Epigastric pain: Secondary | ICD-10-CM

## 2022-10-29 DIAGNOSIS — Z903 Acquired absence of stomach [part of]: Secondary | ICD-10-CM

## 2022-10-29 DIAGNOSIS — Z96642 Presence of left artificial hip joint: Secondary | ICD-10-CM | POA: Diagnosis present

## 2022-10-29 DIAGNOSIS — N200 Calculus of kidney: Secondary | ICD-10-CM | POA: Diagnosis not present

## 2022-10-29 DIAGNOSIS — I700 Atherosclerosis of aorta: Secondary | ICD-10-CM | POA: Diagnosis not present

## 2022-10-29 DIAGNOSIS — E876 Hypokalemia: Secondary | ICD-10-CM | POA: Diagnosis present

## 2022-10-29 DIAGNOSIS — N3289 Other specified disorders of bladder: Secondary | ICD-10-CM | POA: Diagnosis present

## 2022-10-29 LAB — CBC
HCT: 49.5 % (ref 39.0–52.0)
HCT: 46.8 % (ref 39.0–52.0)
Hemoglobin: 16.7 g/dL (ref 13.0–17.0)
Hemoglobin: 16.2 g/dL (ref 13.0–17.0)
MCH: 31.5 pg (ref 26.0–34.0)
MCH: 32.5 pg (ref 26.0–34.0)
MCHC: 33.7 g/dL (ref 30.0–36.0)
MCHC: 34.6 g/dL (ref 30.0–36.0)
MCV: 93.4 fL (ref 80.0–100.0)
MCV: 93.8 fL (ref 80.0–100.0)
Platelets: 191 10*3/uL (ref 150–400)
Platelets: 191 10*3/uL (ref 150–400)
RBC: 5.3 MIL/uL (ref 4.22–5.81)
RBC: 4.99 MIL/uL (ref 4.22–5.81)
RDW: 12.3 % (ref 11.5–15.5)
RDW: 12.5 % (ref 11.5–15.5)
WBC: 11.7 10*3/uL — ABNORMAL HIGH (ref 4.0–10.5)
WBC: 13.3 10*3/uL — ABNORMAL HIGH (ref 4.0–10.5)
nRBC: 0 % (ref 0.0–0.2)
nRBC: 0 % (ref 0.0–0.2)

## 2022-10-29 LAB — COMPREHENSIVE METABOLIC PANEL
ALT: 19 U/L (ref 0–44)
AST: 21 U/L (ref 15–41)
Albumin: 4.4 g/dL (ref 3.5–5.0)
Alkaline Phosphatase: 61 U/L (ref 38–126)
Anion gap: 14 (ref 5–15)
BUN: 17 mg/dL (ref 8–23)
CO2: 26 mmol/L (ref 22–32)
Calcium: 10.1 mg/dL (ref 8.9–10.3)
Chloride: 97 mmol/L — ABNORMAL LOW (ref 98–111)
Creatinine, Ser: 0.72 mg/dL (ref 0.61–1.24)
GFR, Estimated: >60 mL/min (ref >60)
Glucose, Bld: 181 mg/dL — ABNORMAL HIGH (ref 70–99)
Potassium: 3.5 mmol/L (ref 3.5–5.1)
Sodium: 137 mmol/L (ref 135–145)
Total Bilirubin: 1.7 mg/dL — ABNORMAL HIGH (ref 0.3–1.2)
Total Protein: 8.2 g/dL — ABNORMAL HIGH (ref 6.5–8.1)

## 2022-10-29 LAB — URINALYSIS, ROUTINE W REFLEX MICROSCOPIC
Bilirubin Urine: NEGATIVE
Glucose, UA: NEGATIVE mg/dL
Ketones, ur: 20 mg/dL — AB
Leukocytes,Ua: NEGATIVE
Nitrite: NEGATIVE
Protein, ur: >=300 mg/dL — AB
Specific Gravity, Urine: 1.018 (ref 1.005–1.030)
pH: 5 (ref 5.0–8.0)

## 2022-10-29 LAB — CREATININE, SERUM
Creatinine, Ser: 0.68 mg/dL (ref 0.61–1.24)
GFR, Estimated: >60 mL/min (ref >60)

## 2022-10-29 LAB — LIPASE, BLOOD: Lipase: 25 U/L (ref 11–51)

## 2022-10-29 LAB — TROPONIN I (HIGH SENSITIVITY)
Troponin I (High Sensitivity): 17 ng/L (ref <18)
Troponin I (High Sensitivity): 15 ng/L (ref <18)

## 2022-10-29 MED ORDER — ONDANSETRON HCL 4 MG PO TABS: 4.0000 mg | ORAL_TABLET | Freq: Four times a day (QID) | ORAL | Status: DC | PRN

## 2022-10-29 MED ORDER — PANTOPRAZOLE SODIUM 40 MG IV SOLR
40.0000 mg | Freq: Two times a day (BID) | INTRAVENOUS | Status: DC
  Administered 2022-10-29 – 2022-11-03 (×10): 40 mg via INTRAVENOUS
  Filled 2022-10-29 (×10): qty 10

## 2022-10-29 MED ORDER — ENOXAPARIN SODIUM 80 MG/0.8ML IJ SOSY
80.0000 mg | PREFILLED_SYRINGE | Freq: Two times a day (BID) | INTRAMUSCULAR | Status: DC
  Administered 2022-10-29 – 2022-11-01 (×6): 80 mg via SUBCUTANEOUS
  Filled 2022-10-29 (×6): qty 0.8

## 2022-10-29 MED ORDER — IOHEXOL 350 MG/ML SOLN
100.0000 mL | Freq: Once | INTRAVENOUS | Status: AC | PRN
  Administered 2022-10-29: 100 mL via INTRAVENOUS

## 2022-10-29 MED ORDER — ONDANSETRON 4 MG PO TBDP
4.0000 mg | ORAL_TABLET | Freq: Once | ORAL | Status: AC | PRN
  Administered 2022-10-30: 4 mg via ORAL
  Filled 2022-10-29: qty 1

## 2022-10-29 MED ORDER — METOPROLOL TARTRATE 5 MG/5ML IV SOLN: 5.0000 mg | Freq: Four times a day (QID) | INTRAVENOUS | Status: DC | PRN

## 2022-10-29 MED ORDER — SODIUM CHLORIDE 0.9 % IV BOLUS
500.0000 mL | Freq: Once | INTRAVENOUS | Status: AC
  Administered 2022-10-29: 500 mL via INTRAVENOUS

## 2022-10-29 MED ORDER — ONDANSETRON HCL 4 MG/2ML IJ SOLN
4.0000 mg | Freq: Four times a day (QID) | INTRAMUSCULAR | Status: DC | PRN
  Administered 2022-10-29 – 2022-10-30 (×2): 4 mg via INTRAVENOUS
  Filled 2022-10-29 (×2): qty 2

## 2022-10-29 MED ORDER — DEXTROSE-NACL 5-0.9 % IV SOLN: INTRAVENOUS | Status: DC

## 2022-10-29 NOTE — ED Provider Notes (Signed)
[facility] DEPT Provider Note   CSN: [id number] Arrival date & time: 10/29/22  0844     History  Chief Complaint  Patient presents with   Emesis    Joseph Simpson is a 87 y.o. male with a PMHx of HTN, who presents to the ED with concerns for emesis onset 4 days. Was evaluated in the ED on 10/27/22 for similar symptoms, notes that his symptoms improved prior to discharge. Pt reports that he has had nausea and vomiting since he was discharged from the emergency department.  Notes he has been unable to take his medications or eat or drink due to the nausea/vomiting.  Also notes associated sternal chest pain today and shortness of breath.  Denies past medical history of MI or stent placement.  Has had a cardiac catheterization in the past.   The history is provided by the patient. No language interpreter was used.       Home Medications Prior to Admission medications   Medication Sig Start Date End Date Taking? Authorizing Provider  acetaminophen (TYLENOL) 500 MG tablet Take 500-1,000 mg by mouth 2 (two) times daily as needed for moderate pain or headache.    [provider]  apixaban (ELIQUIS) 5 MG TABS tablet Take 1 tablet (5 mg total) by mouth 2 (two) times daily. 02/24/22   Martinique, Peter M, MD  atorvastatin (LIPITOR) 20 MG tablet TAKE ONE TABLET BY MOUTH THREE TIMES A WEEK 12/18/21   Marin Olp, MD  bevacizumab (AVASTIN) 2.5 mg/0.1 mL SOLN 2.5 mg by Intravitreal route. Injection to eyes every 5 weeks - use with vigamox    [provider]  Carboxymethylcellulose Sodium 1 % GEL Place 1 drop into both eyes at bedtime.    [provider]  Cyanocobalamin (B-12 COMPLIANCE INJECTION IJ) Inject 1 Dose as directed every 30 (thirty) days.    [provider]  Docusate Sodium (COLACE PO) Take by mouth.    [provider]  losartan (COZAAR) 25 MG tablet Take 0.5-1 tablets (12.5-25 mg total) by mouth daily. If blood  pressure over 145 when you check- ok to take other half tablet once per day (so total of '25mg'$  per day) 09/21/22   Marin Olp, MD  metoCLOPramide (REGLAN) 5 MG tablet TAKE 1 TABLET BY MOUTH TWICE A DAY 08/11/22   Armbruster, Carlota Raspberry, MD  metoprolol tartrate (LOPRESSOR) 25 MG tablet Take 12.5 mg by mouth 2 (two) times daily.    [provider]  pantoprazole (PROTONIX) 40 MG tablet TAKE 1 TABLET BY MOUTH TWICE A DAY 09/06/22   Armbruster, Carlota Raspberry, MD  polyethylene glycol (MIRALAX / GLYCOLAX) 17 g packet Take 17 g by mouth daily as needed for moderate constipation. Patient not taking: Reported on 09/28/2022    [provider]  prochlorperazine (COMPAZINE) 10 MG tablet Take 1 tablet (10 mg total) by mouth every 8 (eight) hours as needed for nausea or vomiting (take with 12.'5mg'$  benadryl please). 10/27/22   Small, Brooke L, PA  Propylene Glycol (SYSTANE BALANCE OP) Place 1 drop into both eyes daily.    [provider]  tamsulosin (FLOMAX) 0.4 MG CAPS capsule Take 0.4 mg by mouth daily. 12/30/21   [provider]  VIGAMOX 0.5 % ophthalmic solution Place 1 drop into both eyes as directed. Take every 5 weeks as directed - use with avastin, instill 1 drop into both eyes 4 times daily for 2 days after eye injections 05/28/14   [provider]      Allergies    Aspirin, Percocet [oxycodone-acetaminophen], and Vitamins [apatate]    Review of Systems   Review of Systems  Gastrointestinal:  Positive for vomiting.  All other systems reviewed and are negative.   Physical Exam Updated Vital Signs BP (!) 150/58   Pulse 83   Temp 98.3 F (36.8 C) (Oral)   Resp 16   SpO2 100%  Physical Exam Vitals and nursing note reviewed.  Constitutional:      General: He is not in acute distress.    Appearance: He is not diaphoretic.  HENT:     Head: Normocephalic and atraumatic.     Mouth/Throat:     Pharynx: No oropharyngeal exudate.  Eyes:     General: No  scleral icterus.    Conjunctiva/sclera: Conjunctivae normal.  Cardiovascular:     Rate and Rhythm: Normal rate and regular rhythm.     Pulses: Normal pulses.     Heart sounds: Normal heart sounds.  Pulmonary:     Effort: Pulmonary effort is normal. No respiratory distress.     Breath sounds: Normal breath sounds. No wheezing.  Abdominal:     General: Bowel sounds are normal.     Palpations: Abdomen is soft. There is no mass.     Tenderness: There is generalized abdominal tenderness. There is no guarding or rebound.     Comments: Diffuse abdominal TTP.  Musculoskeletal:        General: Normal range of motion.     Cervical back: Normal range of motion and neck supple.  Skin:    General: Skin is warm and dry.  Neurological:     Mental Status: He is alert.  Psychiatric:        Behavior: Behavior normal.     ED Results / Procedures / Treatments   Labs (all labs ordered are listed, but only abnormal results are displayed) Labs Reviewed  COMPREHENSIVE METABOLIC PANEL - Abnormal; Notable for the following components:      Result Value   Chloride 97 (*)    Glucose, Bld 181 (*)    Total Protein 8.2 (*)    Total Bilirubin 1.7 (*)    All other components within normal limits  CBC - Abnormal; Notable for the following components:   WBC 11.7 (*)    All other components within normal limits  URINALYSIS, ROUTINE W REFLEX MICROSCOPIC - Abnormal; Notable for the following components:   Hgb urine dipstick SMALL (*)    Ketones, ur 20 (*)    Protein, ur >=300 (*)    Bacteria, UA RARE (*)    All other components within normal limits  LIPASE, BLOOD  TROPONIN I (HIGH SENSITIVITY)    EKG EKG Interpretation  Date/Time:  Thursday October 29 2022 14:33:09 EST Ventricular Rate:  86 PR Interval:    QRS Duration: 113 QT Interval:  375 QTC Calculation: 449 R Axis:   83 Text Interpretation: Atrial fibrillation Incomplete left bundle branch block Probable LVH with secondary repol abnrm ST  depression, consider ischemia, diffuse lds recurrent afib and increased ischemic changes Confirmed by Aletta Edouard [id number]) on 10/29/2022 2:40:12 PM  Radiology DG Chest Port 1 View  Result Date: 10/29/2022 CLINICAL DATA:  Flu like symptoms EXAM: PORTABLE CHEST 1 VIEW COMPARISON:  Radiograph 01/03/2022 FINDINGS: Normal cardiac silhouette. Chronic bronchitic markings. No infiltrate or effusion. No pneumothorax. Prominent central rib costochondral junctions noted. RIGHT shoulder arthroplasty IMPRESSION: Chronic bronchitic markings.  No acute findings. Electronically Signed  By: Suzy Bouchard M.D.   On: 10/29/2022 14:45    Procedures Procedures    Medications Ordered in ED Medications  ondansetron (ZOFRAN-ODT) disintegrating tablet 4 mg (has no administration in time range)  sodium chloride 0.9 % bolus 500 mL (has no administration in time range)    ED Course/ Medical Decision Making/ A&P Clinical Course as of 10/29/22 1459  Thu Oct 29, 5364  [id number] 87 year old male here with continued abdominal pain and vomiting since being discharged here few days ago for same.  His workup at that time was fairly unremarkable.  He was discharged with Compazine which does not seem to be helping at all.  Getting repeat labs likely will need to be admitted to the hospital for symptom control. [MB]    Clinical Course User Index [MB] Hayden Rasmussen, MD                           Medical Decision Making Amount and/or Complexity of Data Reviewed Labs: ordered. Radiology: ordered.  Risk Prescription drug management.   Patient presents to the emergency department with emesis onset 4 days.  Patient unable to take his medications due to recurrent emesis.  Also has diffuse abdominal pain.  Patient afebrile.  On exam patient with diffuse abdominal tenderness to palpation.  Remainder of exam without acute findings.  Differential diagnosis includes pancreatitis, cholecystitis, appendicitis, acute cystitis,  viral etiology.   Additional history obtained:  External records from outside source obtained and reviewed including: Patient was evaluated in the emergency department on 10/27/2022 for similar concerns.  At that time was diagnosed with flu and discharged home due to symptoms improving.  Labs:  I ordered, and personally interpreted labs.  The pertinent results include:  Lipase negative CBC with leukocytosis at 11.7 Urinalysis with a small amount of hemoglobin otherwise nitrate and leukocyte negative CMP with elevated total bili at 1.7, elevated glucose at 181 otherwise unremarkable   Imaging: I ordered imaging studies including chest x-ray, CTA abdomen pelvis I independently visualized and interpreted imaging which showed chronic bronchitic changes on CXR.  CTA abdomen pelvis ordered and results pending at time of signout. I agree with the radiologist interpretation  Medications:  I ordered medication including IV fluids, Zofran for symptom management.  I have reviewed the patients home medicines and have made adjustments as needed  Patient case discussed with Theressa Stamps, PA-C at sign-out. Plan at sign-out is pending CTA, likely Admission to the hospital, however, plans may change as per oncoming team. Patient care transferred at sign out.    This chart was dictated using voice recognition software, Dragon. Despite the best efforts of this provider to proofread and correct errors, errors may still occur which can change documentation meaning.  Final Clinical Impression(s) / ED Diagnoses Final diagnoses:  Nausea and vomiting, unspecified vomiting type    Rx / DC Orders ED Discharge Orders     None         Maeley Matton A, PA-C 10/29/22 1523    Hayden Rasmussen, MD 10/29/22 2044

## 2022-10-29 NOTE — ED Notes (Signed)
ED TO INPATIENT HANDOFF REPORT  Name/Age/Gender Joseph Simpson 87 y.o. male  Code Status    Code Status Orders  (From admission, onward)           Start     Ordered   10/29/22 1921  Full code  Continuous       Question:  By:  Answer:  Consent: discussion documented in EHR   10/29/22 1923           Code Status History     Date Active Date Inactive Code Status Order ID Comments User Context   01/03/2022 1747 01/09/2022 1517 Full Code [id number]  Nita Sells, MD ED   02/11/2021 1700 02/12/2021 1815 Full Code [id number]  Ventura Bruns, PA-C Inpatient   12/29/2017 1623 12/30/2017 2150 Full Code [id number]  Hiram Gash, MD Inpatient   09/03/2012 1530 09/04/2012 1439 Full Code [id number]  Winferd Humphrey, RN Inpatient       Home/SNF/Other Home  Chief Complaint Nausea & vomiting [R11.2]  Level of Care/Admitting Diagnosis ED Disposition     ED Disposition  Admit   Condition  --   [facility] Area: [facility] [[id number]]  Level of Care: Med-Surg [16]  May place patient in observation at [facility] or [place] if equivalent level of care is available:: Yes  Covid Evaluation: Asymptomatic - no recent exposure (last 10 days) testing not required  Diagnosis: Nausea & vomiting [326712]  Admitting Physician: Elwyn Reach Lealman  Attending Physician: Elwyn Reach [[id number]]          Medical History Past Medical History:  Diagnosis Date   Aortic atherosclerosis ([place])    Arthritis    Atrial fibrillation ([place])    B12 deficiency    Bladder calculi    CAD (coronary artery disease)    cardiologist--- dr Martinique;   abnormal nuclear stress test 11/ 2013;  s/p cardiac cath 10-21-2012 minimal nonobstructiive CAD w/ normal LVF   Diverticulosis    Dyslipidemia    Esophagitis    Full dentures    Gastroparesis    followed by [name] PA ( [facility] in [place]) s/p gastrectomy for ulcers in 1970s   GERD  (gastroesophageal reflux disease)    Heart murmur    Hiatal hernia    History of adenomatous polyp of colon    History of kidney stones    History of melanoma 2008   s/p left forearn wide local excision 06/ 2008   History of syncope 2010   cardiologist --- dr Martinique;  (11-25-2021 per pt no syncope or near syncope since)   Hypertension    Macular degeneration of both eyes    Nocturia associated with benign prostatic hyperplasia    OSA (obstructive sleep apnea)    no cpap   Polyneuropathy    PVC's (premature ventricular contractions) 2010   06/ 2010 per event monitor SR w/ occasional PVCs and 4 beat SVT (in epic)   Wears glasses     Allergies Allergies  Allergen Reactions   Aspirin Other (See Comments)    UPSET STOMACH   Percocet [Oxycodone-Acetaminophen] Nausea And Vomiting   Vitamins [Apatate] Other (See Comments)    Bothers stomach    IV Location/Drains/Wounds Patient Lines/Drains/Airways Status     Active Line/Drains/Airways     Name Placement date Placement time Site Days   Peripheral IV 10/29/22 20 G Anterior;Proximal;Right Forearm 10/29/22  1517  Forearm  less than 1   Incision (Closed)  12/01/21 Penis 12/01/21  1026  -- 332            Labs/Imaging Results for orders placed or performed during the hospital encounter of 10/29/22 (from the past 48 hour(s))  Urinalysis, Routine w reflex microscopic Urine, Clean Catch     Status: Abnormal   Collection Time: 10/29/22  9:28 AM  Result Value Ref Range   Color, Urine YELLOW YELLOW   APPearance CLEAR CLEAR   Specific Gravity, Urine 1.018 1.005 - 1.030   pH 5.0 5.0 - 8.0   Glucose, UA NEGATIVE NEGATIVE mg/dL   Hgb urine dipstick SMALL (A) NEGATIVE   Bilirubin Urine NEGATIVE NEGATIVE   Ketones, ur 20 (A) NEGATIVE mg/dL   Protein, ur >=300 (A) NEGATIVE mg/dL   Nitrite NEGATIVE NEGATIVE   Leukocytes,Ua NEGATIVE NEGATIVE   RBC / HPF 0-5 0 - 5 RBC/hpf   WBC, UA 0-5 0 - 5 WBC/hpf   Bacteria, UA RARE (A) NONE SEEN    Squamous Epithelial / LPF 0-5 0 - 5   Mucus PRESENT     Comment: Performed at [facility], [place] [address], [place], [place] [address]  Lipase, blood     Status: None   Collection Time: 10/29/22  9:41 AM  Result Value Ref Range   Lipase 25 11 - 51 U/L    Comment: Performed at [facility], [place] [address] [place], [place] [address]  Comprehensive metabolic panel     Status: Abnormal   Collection Time: 10/29/22  9:41 AM  Result Value Ref Range   Sodium 137 135 - 145 mmol/L   Potassium 3.5 3.5 - 5.1 mmol/L   Chloride 97 (L) 98 - 111 mmol/L   CO2 26 22 - 32 mmol/L   Glucose, Bld 181 (H) 70 - 99 mg/dL    Comment: Glucose reference range applies only to samples taken after fasting for at least 8 hours.   BUN 17 8 - 23 mg/dL   Creatinine, Ser 0.72 0.61 - 1.24 mg/dL   Calcium 10.1 8.9 - 10.3 mg/dL   Total Protein 8.2 (H) 6.5 - 8.1 g/dL   Albumin 4.4 3.5 - 5.0 g/dL   AST 21 15 - 41 U/L   ALT 19 0 - 44 U/L   Alkaline Phosphatase 61 38 - 126 U/L   Total Bilirubin 1.7 (H) 0.3 - 1.2 mg/dL   GFR, Estimated >60 >60 mL/min    Comment: (NOTE) Calculated using the CKD-EPI Creatinine Equation (2021)    Anion gap 14 5 - 15    Comment: Performed at [facility], [place] [address], [place], [place] [address]  CBC     Status: Abnormal   Collection Time: 10/29/22  9:41 AM  Result Value Ref Range   WBC 11.7 (H) 4.0 - 10.5 K/uL   RBC 5.30 4.22 - 5.81 MIL/uL   Hemoglobin 16.7 13.0 - 17.0 g/dL   HCT 49.5 39.0 - 52.0 %   MCV 93.4 80.0 - 100.0 fL   MCH 31.5 26.0 - 34.0 pg   MCHC 33.7 30.0 - 36.0 g/dL   RDW 12.3 11.5 - 15.5 %   Platelets 191 150 - 400 K/uL   nRBC 0.0 0.0 - 0.2 %    Comment: Performed at [facility], [place] [address], [place], [place] [address]  Troponin I (High Sensitivity)     Status: None   Collection Time: 10/29/22  3:18 PM  Result Value Ref Range   Troponin  I (High Sensitivity) 17 <18 ng/L    Comment:  (NOTE) Elevated high sensitivity troponin I (hsTnI) values and significant  changes across serial measurements may suggest ACS but many other  chronic and acute conditions are known to elevate hsTnI results.  Refer to the "Links" section for chest pain algorithms and additional  guidance. Performed at [facility], [place] [address], [place], [place] [address]   Troponin I (High Sensitivity)     Status: None   Collection Time: 10/29/22  5:03 PM  Result Value Ref Range   Troponin I (High Sensitivity) 15 <18 ng/L    Comment: (NOTE) Elevated high sensitivity troponin I (hsTnI) values and significant  changes across serial measurements may suggest ACS but many other  chronic and acute conditions are known to elevate hsTnI results.  Refer to the "Links" section for chest pain algorithms and additional  guidance. Performed at [facility], [place] [address], [place], [place] [address]    CT Angio Abd/Pel W and/or Wo Contrast  Result Date: 10/29/2022 CLINICAL DATA:  Abdominal pain. Vomiting. Recent diagnosis of flu. EXAM: CTA ABDOMEN AND PELVIS WITHOUT AND WITH CONTRAST TECHNIQUE: Multidetector CT imaging of the abdomen and pelvis was performed using the standard protocol during bolus administration of intravenous contrast. Multiplanar reconstructed images and MIPs were obtained and reviewed to evaluate the vascular anatomy. RADIATION DOSE REDUCTION: This exam was performed according to the departmental dose-optimization program which includes automated exposure control, adjustment of the mA and/or kV according to patient size and/or use of iterative reconstruction technique. CONTRAST:  17m OMNIPAQUE IOHEXOL 350 MG/ML SOLN COMPARISON:  CT 2 days ago 10/27/2022 FINDINGS: VASCULAR Aorta: Normal caliber aorta without aneurysm, dissection, vasculitis or significant stenosis. Mild to moderate calcified plaque. Celiac: Patent without evidence of aneurysm, dissection,  vasculitis or significant stenosis. SMA: Patent without evidence of aneurysm, dissection, vasculitis or significant stenosis. Renals: Both renal arteries are patent without evidence of aneurysm, dissection, vasculitis, fibromuscular dysplasia or significant stenosis. Early branching of renal artery on the right. IMA: Diminutive but patent. Inflow: Patent without evidence of aneurysm, dissection, vasculitis or significant stenosis. Proximal Outflow: Bilateral common femoral and visualized portions of the superficial and profunda femoral arteries are patent without evidence of aneurysm, dissection, vasculitis or significant stenosis. Veins: Venous phase imaging demonstrates patency of the portal, splenic, and mesenteric veins. No filling defects of the iliac veins or IVC. No acute venous findings. Review of the MIP images confirms the above findings. NON-VASCULAR Lower chest: No basilar airspace disease or pleural effusion. Surgical clips adjacent to the distal esophagus. Hepatobiliary: Borderline hepatic steatosis. No focal liver abnormality. Gallbladder physiologically distended, no calcified stone. No biliary dilatation. Pancreas: No ductal dilatation or inflammation. Spleen: Normal in size without focal abnormality. Adrenals/Urinary Tract: No adrenal nodule. Suspected 4 mm stone at the right ureterovesicular junction with prominence of the right ureter. There is no frank hydronephrosis. Areas of cortical scarring in the right kidney. Nonobstructing stone in the lower pole of the right kidney. Punctate nonobstructing stone in the lower pole the left kidney. No left hydronephrosis. Bladder wall appears trabeculated with diverticula. Stomach/Bowel: Postsurgical change of the gastroesophageal junction. Post partial gastrectomy and gastrojejunostomy. Fluid within the stomach with equivocal gastric wall thickening. No obstruction. No small bowel inflammation. The appendix is not definitively seen. Multifocal colonic  diverticulosis. No diverticulitis. Moderate colonic stool burden. Sigmoid colon is redundant coursing into the right pelvis. Decreased rectal distension from CT. Lymphatic: No abdominopelvic adenopathy. Reproductive: Enlarged prostate causing  mass effect on the bladder base. Other: No ascites. No free intra-abdominal air. Postsurgical change of the upper abdominal wall. Musculoskeletal: Left hip arthroplasty. No acute osseous findings. IMPRESSION: VASCULAR: 1. Patent mesenteric vasculature without stenosis. No acute vascular findings. 2.  Aortic Atherosclerosis (ICD10-I70.0). NON-VASCULAR: 1. Suspected 4 mm stone at the right ureterovesicular junction with prominence of the right ureter. No frank hydronephrosis. Additional bilateral nonobstructing renal calculi. 2. Post partial gastrectomy and gastrojejunostomy. Fluid within the stomach with equivocal gastric wall thickening, can be seen with gastritis. 3. Colonic diverticulosis without diverticulitis. 4. Enlarged prostate causing mass effect on the bladder base. Bladder wall appears trabeculated with diverticula, consistent with chronic bladder outlet obstruction. Electronically Signed   By: Keith Rake M.D.   On: 10/29/2022 18:07   DG Chest Port 1 View  Result Date: 10/29/2022 CLINICAL DATA:  Flu like symptoms EXAM: PORTABLE CHEST 1 VIEW COMPARISON:  Radiograph 01/03/2022 FINDINGS: Normal cardiac silhouette. Chronic bronchitic markings. No infiltrate or effusion. No pneumothorax. Prominent central rib costochondral junctions noted. RIGHT shoulder arthroplasty IMPRESSION: Chronic bronchitic markings.  No acute findings. Electronically Signed   By: Suzy Bouchard M.D.   On: 10/29/2022 14:45    Pending Labs Unresulted Labs (From admission, onward)     Start     Ordered   11/05/22 0500  Creatinine, serum  (enoxaparin (LOVENOX)    CrCl < 30 ml/min)  Once,   R       Comments: while on enoxaparin therapy.    10/29/22 2104   10/30/22 0500   Comprehensive metabolic panel  Tomorrow morning,   R        10/29/22 2104   10/30/22 0500  CBC  Tomorrow morning,   R        10/29/22 2104   10/29/22 2115  CBC  Once,   R        10/29/22 2115   10/29/22 2105  Creatinine, serum  (enoxaparin (LOVENOX)    CrCl < 30 ml/min)  Once,   R       Comments: Baseline for enoxaparin therapy IF NOT ALREADY DRAWN.    10/29/22 2104            Vitals/Pain Today's Vitals   10/29/22 1900 10/29/22 2045 10/29/22 2115 10/29/22 2121  BP:  (!) 159/96 (!) 151/83   Pulse:  99 (!) 101   Resp:  13 12   Temp: (!) 97.4 F (36.3 C)     TempSrc: Oral     SpO2:  97% 97%   Weight:    78 kg  Height:    '6\' 1"'$  (1.854 m)    Isolation Precautions No active isolations  Medications Medications  ondansetron (ZOFRAN-ODT) disintegrating tablet 4 mg (has no administration in time range)  enoxaparin (LOVENOX) 100 mg/mL injection 1 mg/kg (has no administration in time range)  dextrose 5 %-0.9 % sodium chloride infusion ( Intravenous New Bag/Given 10/29/22 2113)  ondansetron (ZOFRAN) tablet 4 mg ( Oral See Alternative 10/29/22 2112)    Or  ondansetron (ZOFRAN) injection 4 mg (4 mg Intravenous Given 10/29/22 2112)  metoprolol tartrate (LOPRESSOR) injection 5 mg (has no administration in time range)  sodium chloride 0.9 % bolus 500 mL (0 mLs Intravenous Stopped 10/29/22 1653)  iohexol (OMNIPAQUE) 350 MG/ML injection 100 mL (100 mLs Intravenous Contrast Given 10/29/22 1747)    Mobility walks with person assist

## 2022-10-29 NOTE — ED Provider Notes (Signed)
Accepted handoff at shift change from [facility], PA-C. Please see prior provider note for more detail.   Briefly: Patient is 87 y.o. vomiting for the past 5 days, unable to tolerate food or fluids over the past 3 days.  He also complains of epigastric pain and he has not had a bowel movement in the last 3 days.  Patient has non bloody, non bilious emesis and he denies melena or hematochezia.  DDX: concern for viral etiology, mesenteric ischemia, gastroparesis, gastritis, gastroenteritis  Plan: Pending CTA, likely admission to the hospital   Results for orders placed or performed during the hospital encounter of 10/29/22  Lipase, blood  Result Value Ref Range   Lipase 25 11 - 51 U/L  Comprehensive metabolic panel  Result Value Ref Range   Sodium 137 135 - 145 mmol/L   Potassium 3.5 3.5 - 5.1 mmol/L   Chloride 97 (L) 98 - 111 mmol/L   CO2 26 22 - 32 mmol/L   Glucose, Bld 181 (H) 70 - 99 mg/dL   BUN 17 8 - 23 mg/dL   Creatinine, Ser 0.72 0.61 - 1.24 mg/dL   Calcium 10.1 8.9 - 10.3 mg/dL   Total Protein 8.2 (H) 6.5 - 8.1 g/dL   Albumin 4.4 3.5 - 5.0 g/dL   AST 21 15 - 41 U/L   ALT 19 0 - 44 U/L   Alkaline Phosphatase 61 38 - 126 U/L   Total Bilirubin 1.7 (H) 0.3 - 1.2 mg/dL   GFR, Estimated >60 >60 mL/min   Anion gap 14 5 - 15  CBC  Result Value Ref Range   WBC 11.7 (H) 4.0 - 10.5 K/uL   RBC 5.30 4.22 - 5.81 MIL/uL   Hemoglobin 16.7 13.0 - 17.0 g/dL   HCT 49.5 39.0 - 52.0 %   MCV 93.4 80.0 - 100.0 fL   MCH 31.5 26.0 - 34.0 pg   MCHC 33.7 30.0 - 36.0 g/dL   RDW 12.3 11.5 - 15.5 %   Platelets 191 150 - 400 K/uL   nRBC 0.0 0.0 - 0.2 %  Urinalysis, Routine w reflex microscopic Urine, Clean Catch  Result Value Ref Range   Color, Urine YELLOW YELLOW   APPearance CLEAR CLEAR   Specific Gravity, Urine 1.018 1.005 - 1.030   pH 5.0 5.0 - 8.0   Glucose, UA NEGATIVE NEGATIVE mg/dL   Hgb urine dipstick SMALL (A) NEGATIVE   Bilirubin Urine NEGATIVE NEGATIVE   Ketones, ur 20 (A)  NEGATIVE mg/dL   Protein, ur >=300 (A) NEGATIVE mg/dL   Nitrite NEGATIVE NEGATIVE   Leukocytes,Ua NEGATIVE NEGATIVE   RBC / HPF 0-5 0 - 5 RBC/hpf   WBC, UA 0-5 0 - 5 WBC/hpf   Bacteria, UA RARE (A) NONE SEEN   Squamous Epithelial / LPF 0-5 0 - 5   Mucus PRESENT    DG Chest Port 1 View  Result Date: 10/29/2022 CLINICAL DATA:  Flu like symptoms EXAM: PORTABLE CHEST 1 VIEW COMPARISON:  Radiograph 01/03/2022 FINDINGS: Normal cardiac silhouette. Chronic bronchitic markings. No infiltrate or effusion. No pneumothorax. Prominent central rib costochondral junctions noted. RIGHT shoulder arthroplasty IMPRESSION: Chronic bronchitic markings.  No acute findings. Electronically Signed   By: Suzy Bouchard M.D.   On: 10/29/2022 14:45   CT ABDOMEN PELVIS W CONTRAST  Result Date: 10/27/2022 CLINICAL DATA:  Vomiting EXAM: CT ABDOMEN AND PELVIS WITH CONTRAST TECHNIQUE: Multidetector CT imaging of the abdomen and pelvis was performed using the standard protocol following bolus administration of  intravenous contrast. RADIATION DOSE REDUCTION: This exam was performed according to the departmental dose-optimization program which includes automated exposure control, adjustment of the mA and/or kV according to patient size and/or use of iterative reconstruction technique. CONTRAST:  157m OMNIPAQUE IOHEXOL 300 MG/ML  SOLN COMPARISON:  01/03/2022 FINDINGS: Lower chest: No pleural or pericardial effusion. Scattered metallic clips around the distal esophagus. Hepatobiliary: No focal liver abnormality is seen. No gallstones, gallbladder wall thickening, or biliary dilatation. Pancreas: Unremarkable. No pancreatic ductal dilatation or surrounding inflammatory changes. Spleen: Normal in size without focal abnormality. Adrenals/Urinary Tract: Adrenal glands are unremarkable. 4 mm calculus, lower pole right renal collecting system. 1 mm calculus, left lower pole renal collecting system. Kidneys are without focal lesion, or  hydronephrosis. Bladder is unremarkable. Stomach/Bowel: Postop changes at the GE junction. Status post partial gastrectomy and gastrojejunostomy. Duodenum is nondistended. Distal small bowel decompressed. Appendix not identified. The colon is incompletely distended, with scattered diverticula throughout, no adjacent inflammatory change. Fecal distention of the rectum up to 8.2 cm diameter. Vascular/Lymphatic: Scattered calcified aortic atheromatous plaque without aneurysm or evident stenosis. Portal and hepatic veins patent. No abdominal or pelvic adenopathy. Reproductive: Prostate enlargement protruding into the lumen of the urinary bladder. Other: Right pelvic phleboliths.  No ascites.  No free air. Musculoskeletal: Left hip arthroplasty components project in expected location. Mild spondylitic changes and spurring in the lumbar spine. No acute findings. IMPRESSION: 1. No acute findings. 2. Postop changes as above. 3. Colonic diverticulosis. 4. Nonobstructive bilateral nephrolithiasis. 5. Prostate enlargement. Aortic Atherosclerosis (ICD10-I70.0). Electronically Signed   By: DLucrezia EuropeM.D.   On: 10/27/2022 14:00       Physical Exam  BP (!) 150/58   Pulse 83   Temp 98.3 F (36.8 C) (Oral)   Resp 16   SpO2 100%   Physical Exam  Procedures  Procedures  ED Course / MDM   Clinical Course as of 10/29/22 1524  Thu Dec 14, [id number] old male here with continued abdominal pain and vomiting since being discharged here few days ago for same.  His workup at that time was fairly unremarkable.  He was discharged with Compazine which does not seem to be helping at all.  Getting repeat labs likely will need to be admitted to the hospital for symptom control. [MB]    Clinical Course User Index [MB] BHayden Rasmussen MD   Medical Decision Making Amount and/or Complexity of Data Reviewed Labs: ordered. Radiology: ordered.  Risk Prescription drug management.   Patient presented to ED with  abdominal pain and inability to tolerate PO intake including medications for nausea/vomiting.  Abdominal pain is generalized in nature, patient continues to have intermittent episodes of vomiting despite IV medication.   CTA abdomen pelvis negative for mesenteric ischemia, but did reveal evidence of possible gastritis and a 455mstone at the R UVJ without hydronephrosis.  Patient does not complain of urinary symptoms, UA was significant for small amount of Hgb, proteinuria, and ketones.   I requested consultation for hospital admission and spoke with Dr. GaJonelle Sidleho recommended patient be admitted for symptomatic management.  Discussed with patient hospital admission for further management of his symptoms and he agreed.  Will continue to manage patient symptoms while in ED under our care.        ClTheressa Stamps,Bazile MillsPAUtah2/14/23 1914    ZaMilton FergusonMD 10/31/22 1032

## 2022-10-29 NOTE — H&P (Signed)
History and Physical    Patient: Joseph Simpson TDS:[id number] DOB: Mar 26, 1935 DOA: 10/29/2022 DOS: the patient was seen and examined on 10/29/2022 PCP: Marin Olp, MD  Patient coming from: Home  Chief Complaint:  Chief Complaint  Patient presents with   Emesis   HPI: Joseph Simpson is a 87 y.o. male with medical history significant of patient with history of coronary artery disease, BPH, essential hypertension, suspected gastroparesis in the past, hyperlipidemia and GERD who was in the ER 2 days ago and apparently was diagnosed with flu and sent home.  Since then he has been having severe epigastric pain, nausea and some vomiting.  Denied any diarrhea.  Patient has not been able to eat or drink.  He has not kept food down.  The pain is rated as 9 out of 10, sharp, radiating to his back.  Patient returned to the emergency room today where symptoms are getting worse.  He was seen and evaluated.  His workup revealed no vascular abnormality however post partial gastrectomy and gastrojejunostomy.  This finding within the stomach typically seen with gastritis.  Also enlarged prostate.  At this point patient suspected to have acute gastritis leading to his current symptoms.  He is being admitted to the hospital for further evaluation and treatment.  Review of Systems: As mentioned in the history of present illness. All other systems reviewed and are negative. Past Medical History:  Diagnosis Date   Aortic atherosclerosis ([place])    Arthritis    Atrial fibrillation ([place])    B12 deficiency    Bladder calculi    CAD (coronary artery disease)    cardiologist--- dr Martinique;   abnormal nuclear stress test 11/ 2013;  s/p cardiac cath 10-21-2012 minimal nonobstructiive CAD w/ normal LVF   Diverticulosis    Dyslipidemia    Esophagitis    Full dentures    Gastroparesis    followed by [name] PA ( [facility] in [place]) s/p gastrectomy for ulcers in 1970s   GERD (gastroesophageal reflux  disease)    Heart murmur    Hiatal hernia    History of adenomatous polyp of colon    History of kidney stones    History of melanoma 2008   s/p left forearn wide local excision 06/ 2008   History of syncope 2010   cardiologist --- dr Martinique;  (11-25-2021 per pt no syncope or near syncope since)   Hypertension    Macular degeneration of both eyes    Nocturia associated with benign prostatic hyperplasia    OSA (obstructive sleep apnea)    no cpap   Polyneuropathy    PVC's (premature ventricular contractions) 2010   06/ 2010 per event monitor SR w/ occasional PVCs and 4 beat SVT (in epic)   Wears glasses    Past Surgical History:  Procedure Laterality Date   APPENDECTOMY     child   CARDIAC CATHETERIZATION  10/2012   minimal nonobstructive CAD with normal LV function   CATARACT EXTRACTION W/ INTRAOCULAR LENS IMPLANT Bilateral    2009 approx   CYSTOSCOPY WITH LITHOLAPAXY N/A 07/13/2016   Procedure: CYSTOSCOPY WITH LITHOLAPAXY;  Surgeon: Franchot Gallo, MD;  Location: [facility];  Service: Urology;  Laterality: N/A;   CYSTOSCOPY WITH LITHOLAPAXY N/A 12/01/2021   Procedure: CYSTOSCOPY WITH LITHOLAPAXY;  Surgeon: Franchot Gallo, MD;  Location: [facility];  Service: Urology;  Laterality: N/A;   CYSTOSCOPY/RETROGRADE/URETEROSCOPY/STONE EXTRACTION WITH BASKET  02/24/2012   Procedure: CYSTOSCOPY/RETROGRADE/URETEROSCOPY/STONE EXTRACTION WITH BASKET;  Surgeon: Franchot Gallo, MD;  Location: [facility];  Service: Urology;  Laterality: Bilateral;  1 HOUR   ESOPHAGOGASTRODUODENOSCOPY N/A 01/06/2022   Procedure: ESOPHAGOGASTRODUODENOSCOPY (EGD);  Surgeon: Arta Silence, MD;  Location: [facility];  Service: Gastroenterology;  Laterality: N/A;   EXTRACORPOREAL SHOCK WAVE LITHOTRIPSY  11/16/2011   '@[facility]'$    GASTRECTOMY     1970s  for ulcers   HEMORRHOID SURGERY     yrs ago   HOLMIUM LASER APPLICATION N/A 6/60/6301   Procedure: HOLMIUM LASER APPLICATION;   Surgeon: Franchot Gallo, MD;  Location: [facility];  Service: Urology;  Laterality: N/A;   INGUINAL HERNIA REPAIR Left 07/13/2016   Procedure: REPAIR LEFT INGUINAL HERNIA;  Surgeon: Armandina Gemma, MD;  Location: [facility];  Service: General;  Laterality: Left;   INSERTION OF MESH Left 07/13/2016   Procedure: INSERTION OF MESH;  Surgeon: Armandina Gemma, MD;  Location: [facility];  Service: General;  Laterality: Left;   MELANOMA EXCISION Left 05/05/2007   '@[facility]'$  ;   LEFT FOREARM   TONSILLECTOMY     child   TOTAL HIP ARTHROPLASTY Left 02/11/2021   Procedure: TOTAL HIP ARTHROPLASTY;  Surgeon: Marchia Bond, MD;  Location: [facility];  Service: Orthopedics;  Laterality: Left;   TOTAL SHOULDER ARTHROPLASTY Right 12/29/2017   Procedure: TOTAL SHOULDER ARTHROPLASTY;  Surgeon: Hiram Gash, MD;  Location: [place];  Service: Orthopedics;  Laterality: Right;   Social History:  reports that he quit smoking about 66 years ago. His smoking use included cigarettes. He has a 0.40 pack-year smoking history. He has never used smokeless tobacco. He reports that he does not drink alcohol and does not use drugs.  Allergies  Allergen Reactions   Aspirin Other (See Comments)    UPSET STOMACH   Percocet [Oxycodone-Acetaminophen] Nausea And Vomiting   Vitamins [Apatate] Other (See Comments)    Bothers stomach    Family History  Problem Relation Age of Onset   Heart disease Mother        CABG in her 35s   Pneumonia Father    Cancer Sister        breast   Lung cancer Brother        smoker    Prior to Admission medications   Medication Sig Start Date End Date Taking? Authorizing Provider  acetaminophen (TYLENOL) 500 MG tablet Take 500-1,000 mg by mouth 2 (two) times daily as needed for moderate pain or headache.    [provider]  apixaban (ELIQUIS) 5 MG TABS tablet Take 1 tablet (5 mg total) by mouth 2 (two) times daily. 02/24/22   Martinique, Peter M, MD  atorvastatin (LIPITOR) 20 MG tablet  TAKE ONE TABLET BY MOUTH THREE TIMES A WEEK 12/18/21   Marin Olp, MD  bevacizumab (AVASTIN) 2.5 mg/0.1 mL SOLN 2.5 mg by Intravitreal route. Injection to eyes every 5 weeks - use with vigamox    [provider]  Carboxymethylcellulose Sodium 1 % GEL Place 1 drop into both eyes at bedtime.    [provider]  Cyanocobalamin (B-12 COMPLIANCE INJECTION IJ) Inject 1 Dose as directed every 30 (thirty) days.    [provider]  Docusate Sodium (COLACE PO) Take by mouth.    [provider]  losartan (COZAAR) 25 MG tablet Take 0.5-1 tablets (12.5-25 mg total) by mouth daily. If blood pressure over 145 when you check- ok to take other half tablet once per day (so total of '25mg'$  per day) 09/21/22   Yong Channel,  Brayton Mars, MD  metoCLOPramide (REGLAN) 5 MG tablet TAKE 1 TABLET BY MOUTH TWICE A DAY 08/11/22   Armbruster, Carlota Raspberry, MD  metoprolol tartrate (LOPRESSOR) 25 MG tablet Take 12.5 mg by mouth 2 (two) times daily.    [provider]  pantoprazole (PROTONIX) 40 MG tablet TAKE 1 TABLET BY MOUTH TWICE A DAY 09/06/22   Armbruster, Carlota Raspberry, MD  polyethylene glycol (MIRALAX / GLYCOLAX) 17 g packet Take 17 g by mouth daily as needed for moderate constipation. Patient not taking: Reported on 09/28/2022    [provider]  prochlorperazine (COMPAZINE) 10 MG tablet Take 1 tablet (10 mg total) by mouth every 8 (eight) hours as needed for nausea or vomiting (take with 12.'5mg'$  benadryl please). 10/27/22   Small, Brooke L, PA  Propylene Glycol (SYSTANE BALANCE OP) Place 1 drop into both eyes daily.    [provider]  tamsulosin (FLOMAX) 0.4 MG CAPS capsule Take 0.4 mg by mouth daily. 12/30/21   [provider]  VIGAMOX 0.5 % ophthalmic solution Place 1 drop into both eyes as directed. Take every 5 weeks as directed - use with avastin, instill 1 drop into both eyes 4 times daily for 2 days after eye injections 05/28/14   [provider]     Physical Exam: Vitals:   10/29/22 1645 10/29/22 1700 10/29/22 1730 10/29/22 1845  BP: (!) 143/75 133/69 (!) 168/100 (!) 167/91  Pulse: (!) 104 95 79 95  Resp: (!) '21 15 12 15  '$ Temp:  98 F (36.7 C)    TempSrc:  Oral    SpO2: 98% 97% 99% 97%   Constitutional: Acutely ill looking, in obvious distress due to pain. Eyes: PERRL, lids and conjunctivae normal ENMT: Mucous membranes are dry. Posterior pharynx clear of any exudate or lesions.Normal dentition.  Neck: normal, supple, no masses, no thyromegaly Respiratory: clear to auscultation bilaterally, no wheezing, no crackles. Normal respiratory effort. No accessory muscle use.  Cardiovascular: Regular rate and rhythm, no murmurs / rubs / gallops. No extremity edema. 2+ pedal pulses. No carotid bruits.  Abdomen: Flat, nondistended, tenderness in the epigastric region, no masses palpated. No hepatosplenomegaly. Bowel sounds positive.  Musculoskeletal: Good range of motion, no joint swelling or tenderness, Skin: no rashes, lesions, ulcers. No induration Neurologic: CN 2-12 grossly intact. Sensation intact, DTR normal. Strength 5/5 in all 4.  Psychiatric: Normal judgment and insight. Alert and oriented x 3.  Anxious mood  Data Reviewed:  White count is 13.3 the rest of the CBC within normal.  Sodium 137 potassium 3.5 chloride 97.  Glucose is 181.  Total protein 8.2.  Chest x-ray showed chronic bronchitic markings.  CT abdomen pelvis showed finding as above mainly post partial gastrectomy and gastrojejunostomy.  Findings of acute gastritis.  Assessment and Plan:  #1 intractable nausea vomiting: Secondary to acute gastritis.  Related to previous GI surgery it appears.  Patient will be admitted.  Symptomatic management of the nausea vomiting.  Consider Protonix.  Supportive care medically.  #2 acute gastritis: Continue as per above.  #3 leukocytosis: May be due to demargination.  Continue to monitor white count.  #4 BPH: Continue  Flomax  #5 essential hypertension: Confirm and resume home regimen as tolerated.  Patient is likely to be n.p.o. for now and we can use IV labetalol as needed for blood pressure control.  #6 coronary artery disease: Stable at baseline.  #7 GERD: Will be on Protonix.  Continue monitoring  #8 suspected history of gastroparesis: Cannot  find any previous studies.  If symptoms persist may need gastric emptying study.    Advance Care Planning:   Code Status: Full Code   Consults: None but may require a GI consult if symptoms persist  Family Communication: No family at bedside  Severity of Illness: The appropriate patient status for this patient is INPATIENT. Inpatient status is judged to be reasonable and necessary in order to provide the required intensity of service to ensure the patient's safety. The patient's presenting symptoms, physical exam findings, and initial radiographic and laboratory data in the context of their chronic comorbidities is felt to place them at high risk for further clinical deterioration. Furthermore, it is not anticipated that the patient will be medically stable for discharge from the hospital within 2 midnights of admission.   * I certify that at the point of admission it is my clinical judgment that the patient will require inpatient hospital care spanning beyond 2 midnights from the point of admission due to high intensity of service, high risk for further deterioration and high frequency of surveillance required.*  AuthorBarbette Merino, MD 10/29/2022 7:31 PM  For on call review [email or web address].

## 2022-10-29 NOTE — ED Triage Notes (Signed)
Pt reports dx with the flu 2 days ago and d/c home. Pt stating he has been unable to eat or drink due to vomiting.

## 2022-10-30 DIAGNOSIS — Z87891 Personal history of nicotine dependence: Secondary | ICD-10-CM | POA: Diagnosis not present

## 2022-10-30 DIAGNOSIS — R1310 Dysphagia, unspecified: Secondary | ICD-10-CM | POA: Diagnosis not present

## 2022-10-30 DIAGNOSIS — N138 Other obstructive and reflux uropathy: Secondary | ICD-10-CM | POA: Diagnosis present

## 2022-10-30 DIAGNOSIS — I2510 Atherosclerotic heart disease of native coronary artery without angina pectoris: Secondary | ICD-10-CM | POA: Diagnosis present

## 2022-10-30 DIAGNOSIS — I4891 Unspecified atrial fibrillation: Secondary | ICD-10-CM | POA: Diagnosis present

## 2022-10-30 DIAGNOSIS — K2970 Gastritis, unspecified, without bleeding: Secondary | ICD-10-CM | POA: Diagnosis not present

## 2022-10-30 DIAGNOSIS — D72828 Other elevated white blood cell count: Secondary | ICD-10-CM | POA: Diagnosis present

## 2022-10-30 DIAGNOSIS — Z801 Family history of malignant neoplasm of trachea, bronchus and lung: Secondary | ICD-10-CM | POA: Diagnosis not present

## 2022-10-30 DIAGNOSIS — K229 Disease of esophagus, unspecified: Secondary | ICD-10-CM | POA: Diagnosis not present

## 2022-10-30 DIAGNOSIS — K2900 Acute gastritis without bleeding: Secondary | ICD-10-CM | POA: Diagnosis present

## 2022-10-30 DIAGNOSIS — Z903 Acquired absence of stomach [part of]: Secondary | ICD-10-CM | POA: Diagnosis not present

## 2022-10-30 DIAGNOSIS — K2289 Other specified disease of esophagus: Secondary | ICD-10-CM | POA: Diagnosis not present

## 2022-10-30 DIAGNOSIS — I10 Essential (primary) hypertension: Secondary | ICD-10-CM | POA: Diagnosis present

## 2022-10-30 DIAGNOSIS — Z85820 Personal history of malignant melanoma of skin: Secondary | ICD-10-CM | POA: Diagnosis not present

## 2022-10-30 DIAGNOSIS — R112 Nausea with vomiting, unspecified: Secondary | ICD-10-CM | POA: Diagnosis present

## 2022-10-30 DIAGNOSIS — K209 Esophagitis, unspecified without bleeding: Secondary | ICD-10-CM | POA: Diagnosis not present

## 2022-10-30 DIAGNOSIS — Z8249 Family history of ischemic heart disease and other diseases of the circulatory system: Secondary | ICD-10-CM | POA: Diagnosis not present

## 2022-10-30 DIAGNOSIS — Z79899 Other long term (current) drug therapy: Secondary | ICD-10-CM | POA: Diagnosis not present

## 2022-10-30 DIAGNOSIS — K449 Diaphragmatic hernia without obstruction or gangrene: Secondary | ICD-10-CM | POA: Diagnosis not present

## 2022-10-30 DIAGNOSIS — Z96611 Presence of right artificial shoulder joint: Secondary | ICD-10-CM | POA: Diagnosis present

## 2022-10-30 DIAGNOSIS — N202 Calculus of kidney with calculus of ureter: Secondary | ICD-10-CM | POA: Diagnosis present

## 2022-10-30 DIAGNOSIS — E876 Hypokalemia: Secondary | ICD-10-CM | POA: Diagnosis present

## 2022-10-30 DIAGNOSIS — Z20822 Contact with and (suspected) exposure to covid-19: Secondary | ICD-10-CM | POA: Diagnosis present

## 2022-10-30 DIAGNOSIS — Z96642 Presence of left artificial hip joint: Secondary | ICD-10-CM | POA: Diagnosis present

## 2022-10-30 DIAGNOSIS — Z7901 Long term (current) use of anticoagulants: Secondary | ICD-10-CM | POA: Diagnosis not present

## 2022-10-30 DIAGNOSIS — G629 Polyneuropathy, unspecified: Secondary | ICD-10-CM | POA: Diagnosis present

## 2022-10-30 DIAGNOSIS — E785 Hyperlipidemia, unspecified: Secondary | ICD-10-CM | POA: Diagnosis present

## 2022-10-30 DIAGNOSIS — E538 Deficiency of other specified B group vitamins: Secondary | ICD-10-CM | POA: Diagnosis present

## 2022-10-30 DIAGNOSIS — K2100 Gastro-esophageal reflux disease with esophagitis, without bleeding: Secondary | ICD-10-CM | POA: Diagnosis present

## 2022-10-30 DIAGNOSIS — N320 Bladder-neck obstruction: Secondary | ICD-10-CM | POA: Diagnosis present

## 2022-10-30 DIAGNOSIS — R1013 Epigastric pain: Secondary | ICD-10-CM | POA: Diagnosis not present

## 2022-10-30 DIAGNOSIS — G4730 Sleep apnea, unspecified: Secondary | ICD-10-CM | POA: Diagnosis not present

## 2022-10-30 DIAGNOSIS — N401 Enlarged prostate with lower urinary tract symptoms: Secondary | ICD-10-CM | POA: Diagnosis present

## 2022-10-30 LAB — COMPREHENSIVE METABOLIC PANEL
ALT: 15 U/L (ref 0–44)
AST: 19 U/L (ref 15–41)
Albumin: 4.5 g/dL (ref 3.5–5.0)
Alkaline Phosphatase: 57 U/L (ref 38–126)
Anion gap: 13 (ref 5–15)
BUN: 18 mg/dL (ref 8–23)
CO2: 27 mmol/L (ref 22–32)
Calcium: 9.5 mg/dL (ref 8.9–10.3)
Chloride: 101 mmol/L (ref 98–111)
Creatinine, Ser: 0.62 mg/dL (ref 0.61–1.24)
GFR, Estimated: >60 mL/min (ref >60)
Glucose, Bld: 151 mg/dL — ABNORMAL HIGH (ref 70–99)
Potassium: 3.2 mmol/L — ABNORMAL LOW (ref 3.5–5.1)
Sodium: 141 mmol/L (ref 135–145)
Total Bilirubin: 1.5 mg/dL — ABNORMAL HIGH (ref 0.3–1.2)
Total Protein: 7.9 g/dL (ref 6.5–8.1)

## 2022-10-30 LAB — CBC
HCT: 47.7 % (ref 39.0–52.0)
Hemoglobin: 16.4 g/dL (ref 13.0–17.0)
MCH: 32.5 pg (ref 26.0–34.0)
MCHC: 34.4 g/dL (ref 30.0–36.0)
MCV: 94.5 fL (ref 80.0–100.0)
Platelets: 175 10*3/uL (ref 150–400)
RBC: 5.05 MIL/uL (ref 4.22–5.81)
RDW: 12.5 % (ref 11.5–15.5)
WBC: 10 10*3/uL (ref 4.0–10.5)
nRBC: 0 % (ref 0.0–0.2)

## 2022-10-30 MED ORDER — METOCLOPRAMIDE HCL 5 MG/ML IJ SOLN
5.0000 mg | Freq: Two times a day (BID) | INTRAMUSCULAR | Status: DC
  Administered 2022-10-30 – 2022-11-03 (×9): 5 mg via INTRAVENOUS
  Filled 2022-10-30 (×9): qty 2

## 2022-10-30 MED ORDER — METOPROLOL TARTRATE 12.5 MG HALF TABLET
12.5000 mg | ORAL_TABLET | Freq: Two times a day (BID) | ORAL | Status: DC
  Administered 2022-10-30 – 2022-11-03 (×9): 12.5 mg via ORAL
  Filled 2022-10-30 (×10): qty 1

## 2022-10-30 MED ORDER — MELATONIN 5 MG PO TABS
5.0000 mg | ORAL_TABLET | Freq: Every day | ORAL | Status: DC
  Administered 2022-10-30 – 2022-11-02 (×4): 5 mg via ORAL
  Filled 2022-10-30 (×4): qty 1

## 2022-10-30 MED ORDER — ONDANSETRON HCL 4 MG/2ML IJ SOLN: 4.0000 mg | Freq: Four times a day (QID) | INTRAMUSCULAR | Status: DC

## 2022-10-30 MED ORDER — POTASSIUM CHLORIDE CRYS ER 20 MEQ PO TBCR
40.0000 meq | EXTENDED_RELEASE_TABLET | Freq: Once | ORAL | Status: AC
  Administered 2022-10-30: 40 meq via ORAL
  Filled 2022-10-30: qty 2

## 2022-10-30 MED ORDER — SODIUM CHLORIDE 0.9 % IV SOLN: INTRAVENOUS | Status: DC

## 2022-10-30 MED ORDER — ATORVASTATIN CALCIUM 20 MG PO TABS
20.0000 mg | ORAL_TABLET | ORAL | Status: DC
  Administered 2022-10-30 – 2022-11-02 (×2): 20 mg via ORAL
  Filled 2022-10-30 (×2): qty 1

## 2022-10-30 MED ORDER — ONDANSETRON HCL 4 MG PO TABS: 4.0000 mg | ORAL_TABLET | Freq: Four times a day (QID) | ORAL | Status: DC

## 2022-10-30 NOTE — Progress Notes (Signed)
PROGRESS NOTE  Joseph Simpson  DOB: August 23, 1935  PCP: Marin Olp, MD NIO:[id number]  DOA: 10/29/2022  LOS: 0 days  Hospital Day: 2  Brief narrative: Joseph Simpson is a 87 y.o. male with PMH significant for HTN, HLD, A-fib, CAD, who had partial gastrectomy and gastrojejunostomy 40 years ago for a bleeding ulcer, severe esophagitis on EGD 12/2021, nephrolithiasis, OSA, B12 deficiency, polyneuropathy.. 12/12, patient was brought to the ED from home with generalized weakness for a week and nausea and vomiting for 1 day.  CT abdomen pelvis unremarkable.  Labs within normal limit.  Respiratory virus panel negative.  He was given symptomatic management with Compazine, Benadryl and was discharged to home.   At home, his symptoms recurred.  He started having nausea and vomiting again.  He was unable to hold any food down, started having severe epigastric pain 9/10 radiating to the back and hence returned back to ED again on 12/14. In the ED, he was afebrile, heart rate in 70s, blood pressure 180/81, breathing on room air Labs with WBC count 11.6, glucose 181, renal function normal, troponin normal UTI with clear yellow urine with negative nitrites, leukocyte Chest x-ray showed chronic bronchitic changes and no acute infiltrates. CT angio abdomen pelvis did not show acute vascular findings, which showed patent mesenteric vasculature without stenosis.  But it also suspected a 4 mm stone at the right ureterovesicular junction with prominence of the right ureter. No frank hydronephrosis. Additional bilateral nonobstructing renal calculi.  It showed enlarged prostate causing mass effect on the bladder base with bladder wall trabeculation and diverticula consistent with chronic bladder outlet obstruction  We also showed post partial gastrectomy and gastrojejunostomy. Fluid within the stomach with equivocal gastric wall thickening, can be seen with gastritis. Colonic diverticulosis without  diverticulitis. Admitted to [facility]  Subjective: Patient was seen and examined this morning.  Pleasant Afebrile, heart rate in 80s, blood pressure elevated to 170s this morning, breathing on room air Labs this morning with potassium low at 3.2  Assessment and plan: Intractable nausea, vomiting, poor oral intake History of severe esophagitis Prior history of partial gastrectomy and gastrojejunostomy had partial gastrectomy and gastrojejunostomy 40 years ago for a bleeding ulcer. In an EGD in Feb 2023, patient was found to have severe esophagitis. At home, he was on Protonix 40 mg twice daily.  It seems that in the past he was also tried on Reglan 5 mg twice daily. On exam today, patient complains of fullness of the stomach and epigastric pain. No report of hematemesis, hematochezia, melena.  Hemoglobin stable. Currently continued on IV Protonix.  I will also add on IV Reglan Continue IV hydration for now because of poor oral intake Recent Labs  Lab 10/27/22 1222 10/29/22 0941 10/29/22 2118 10/30/22 0547  WBC 8.4 11.7* 13.3* 10.0   4 mm right UVJ stone with prominent right ureter BPH Chronic bladder outlet obstruction Patient has chronic urological issues and is on Flomax at home. I am not sure if the small size stone is causing any acute issue at this time.  If symptoms do not improve, will get urology consult.  Otherwise he can follow-up as an outpatient. Continue Flomax  Essential hypertension PTA on Lopressor 12.5 mg twice daily, losartan 25 mg daily as needed Resume metoprolol.  Continue to monitor blood pressure.  CAD, HLD PTA on Eliquis 5 mg twice daily, Lipitor 20 mg 3 times a week, Eliquis on hold just in case any procedures required.  Continue Lipitor  A-fib Continue metoprolol.  Eliquis on hold  Goals of care   Code Status: Full Code    Mobility: Encourage ambulation  Scheduled Meds:  atorvastatin  20 mg Oral Q M,W,F-2000   enoxaparin (LOVENOX) injection  80  mg Subcutaneous BID   metoCLOPramide (REGLAN) injection  5 mg Intravenous Q12H   metoprolol tartrate  12.5 mg Oral BID   pantoprazole (PROTONIX) IV  40 mg Intravenous Q12H   potassium chloride  40 mEq Oral Once    PRN meds: metoprolol tartrate, ondansetron   Infusions:   sodium chloride      Skin assessment:     Nutritional status:  Body mass index is 22.69 kg/m.          Diet:  Diet Order             Diet regular Fluid consistency: Thin  Diet effective now                   DVT prophylaxis: SCDs Place and maintain sequential compression device Start: 10/30/22 1333  Antimicrobials: None Fluid: None Consultants: None Family Communication: Son at bedside  Status is: Observation Continue in-hospital care because: Continues to have symptoms.  Needs further monitoring and management Level of care: Med-Surg   Dispo: The patient is from: Home              Anticipated d/c is to: Pending clinical course              Patient currently is not medically stable to d/c.   Difficult to place patient No    Antimicrobials: Anti-infectives (From admission, onward)    None       Objective: Vitals:   10/30/22 0947 10/30/22 1116  BP: (!) 160/87 (!) 166/85  Pulse: 86 (!) 41  Resp:  18  Temp:  98.4 F (36.9 C)  SpO2:  97%    Intake/Output Summary (Last 24 hours) at 10/30/2022 1341 Last data filed at 10/30/2022 1116 Gross per 24 hour  Intake 1430 ml  Output 225 ml  Net 1205 ml   Filed Weights   10/29/22 2121  Weight: 78 kg   Weight change:  Body mass index is 22.69 kg/m.   Physical Exam: General exam: Pleasant, elderly Caucasian male.  Lying down in bed. Skin: No rashes, lesions or ulcers. HEENT: Atraumatic, normocephalic, no obvious bleeding Lungs: Clear to auscultation bilaterally CVS: Regular rate and rhythm, no murmur GI/Abd soft, tenderness present in epigastrium, nondistended, bowel sound present CNS: Alert, awake, oriented x 3, hard of  hearing Psychiatry: Mood appropriate Extremities: No pedal edema, no calf tenderness  Data Review: I have personally reviewed the laboratory data and studies available.  F/u labs ordered Unresulted Labs (From admission, onward)     Start     Ordered   11/05/22 0500  Creatinine, serum  (enoxaparin (LOVENOX)    CrCl < 30 ml/min)  Once,   R       Comments: while on enoxaparin therapy.    10/29/22 2104            Total time spent in review of labs and imaging, patient evaluation, formulation of plan, documentation and communication with family - 79 minutes  Signed, Terrilee Croak, MD [facility] 10/30/2022

## 2022-10-30 NOTE — TOC CM/SW Note (Signed)
Transition of Care [facility]) Screening Note  Patient Details  Name: Joseph Simpson Date of Birth: Aug 12, 1935  Transition of Care [facility]) CM/SW Contact:    Sherie Don, LCSW Phone Number: 10/30/2022, 9:44 AM  Transition of Care Department [facility]) has reviewed patient and no TOC needs have been identified at this time. We will continue to monitor patient advancement through interdisciplinary progression rounds. If new patient transition needs arise, please place a TOC consult.

## 2022-10-30 NOTE — Progress Notes (Signed)
Mobility Specialist - Progress Note   10/30/22 1100  Orthostatic Lying   BP- Lying 151/88  Pulse- Lying 77  Orthostatic Sitting  BP- Sitting 156/85  Pulse- Sitting 82  Orthostatic Standing at 0 minutes  BP- Standing at 0 minutes (!) 186/102  Pulse- Standing at 0 minutes 112  Mobility  Activity Stood at bedside  Level of Assistance Minimal assist, patient does 75% or more  Assistive Device None  Activity Response Tolerated fair  $Mobility charge 1 Mobility   Pt was found in bed and agreeable to try mobilizing. Pt stated feeling dizzy and unsteady when standing. Pt was left in bed with necessities in reach and family in room. RN notified of session.  Ferd Hibbs Mobility Specialist

## 2022-10-31 DIAGNOSIS — R112 Nausea with vomiting, unspecified: Secondary | ICD-10-CM | POA: Diagnosis not present

## 2022-10-31 MED ORDER — POLYVINYL ALCOHOL 1.4 % OP SOLN
1.0000 [drp] | Freq: Two times a day (BID) | OPHTHALMIC | Status: DC
  Administered 2022-10-31 – 2022-11-03 (×7): 1 [drp] via OPHTHALMIC
  Filled 2022-10-31 (×2): qty 15

## 2022-10-31 MED ORDER — TAMSULOSIN HCL 0.4 MG PO CAPS
0.4000 mg | ORAL_CAPSULE | Freq: Every day | ORAL | Status: DC
  Administered 2022-10-31 – 2022-11-03 (×4): 0.4 mg via ORAL
  Filled 2022-10-31 (×4): qty 1

## 2022-10-31 MED ORDER — LOSARTAN POTASSIUM 25 MG PO TABS
25.0000 mg | ORAL_TABLET | Freq: Every day | ORAL | Status: DC
  Administered 2022-10-31 – 2022-11-03 (×4): 25 mg via ORAL
  Filled 2022-10-31 (×4): qty 1

## 2022-10-31 NOTE — Evaluation (Signed)
Clinical/Bedside Swallow Evaluation Patient Details  Name: Joseph Simpson MRN: [id number] Date of Birth: Feb 03, 1935  Today's Date: 10/31/2022 Time: SLP Start Time (ACUTE ONLY): 1120 SLP Stop Time (ACUTE ONLY): 1131 SLP Time Calculation (min) (ACUTE ONLY): 11 min  Past Medical History:  Past Medical History:  Diagnosis Date   Aortic atherosclerosis ([place])    Arthritis    Atrial fibrillation ([place])    B12 deficiency    Bladder calculi    CAD (coronary artery disease)    cardiologist--- dr Martinique;   abnormal nuclear stress test 11/ 2013;  s/p cardiac cath 10-21-2012 minimal nonobstructiive CAD w/ normal LVF   Diverticulosis    Dyslipidemia    Esophagitis    Full dentures    Gastroparesis    followed by [name] PA ( [facility] in [place]) s/p gastrectomy for ulcers in 1970s   GERD (gastroesophageal reflux disease)    Heart murmur    Hiatal hernia    History of adenomatous polyp of colon    History of kidney stones    History of melanoma 2008   s/p left forearn wide local excision 06/ 2008   History of syncope 2010   cardiologist --- dr Martinique;  (11-25-2021 per pt no syncope or near syncope since)   Hypertension    Macular degeneration of both eyes    Nocturia associated with benign prostatic hyperplasia    OSA (obstructive sleep apnea)    no cpap   Polyneuropathy    PVC's (premature ventricular contractions) 2010   06/ 2010 per event monitor SR w/ occasional PVCs and 4 beat SVT (in epic)   Wears glasses    Past Surgical History:  Past Surgical History:  Procedure Laterality Date   APPENDECTOMY     child   CARDIAC CATHETERIZATION  10/2012   minimal nonobstructive CAD with normal LV function   CATARACT EXTRACTION W/ INTRAOCULAR LENS IMPLANT Bilateral    2009 approx   CYSTOSCOPY WITH LITHOLAPAXY N/A 07/13/2016   Procedure: CYSTOSCOPY WITH LITHOLAPAXY;  Surgeon: Franchot Gallo, MD;  Location: [facility];  Service: Urology;  Laterality: N/A;   CYSTOSCOPY WITH  LITHOLAPAXY N/A 12/01/2021   Procedure: CYSTOSCOPY WITH LITHOLAPAXY;  Surgeon: Franchot Gallo, MD;  Location: [facility];  Service: Urology;  Laterality: N/A;   CYSTOSCOPY/RETROGRADE/URETEROSCOPY/STONE EXTRACTION WITH BASKET  02/24/2012   Procedure: CYSTOSCOPY/RETROGRADE/URETEROSCOPY/STONE EXTRACTION WITH BASKET;  Surgeon: Franchot Gallo, MD;  Location: [facility];  Service: Urology;  Laterality: Bilateral;  1 HOUR   ESOPHAGOGASTRODUODENOSCOPY N/A 01/06/2022   Procedure: ESOPHAGOGASTRODUODENOSCOPY (EGD);  Surgeon: Arta Silence, MD;  Location: [facility];  Service: Gastroenterology;  Laterality: N/A;   EXTRACORPOREAL SHOCK WAVE LITHOTRIPSY  11/16/2011   '@[facility]'$    GASTRECTOMY     1970s  for ulcers   HEMORRHOID SURGERY     yrs ago   HOLMIUM LASER APPLICATION N/A 04/22/3015   Procedure: HOLMIUM LASER APPLICATION;  Surgeon: Franchot Gallo, MD;  Location: [facility];  Service: Urology;  Laterality: N/A;   INGUINAL HERNIA REPAIR Left 07/13/2016   Procedure: REPAIR LEFT INGUINAL HERNIA;  Surgeon: Armandina Gemma, MD;  Location: [facility];  Service: General;  Laterality: Left;   INSERTION OF MESH Left 07/13/2016   Procedure: INSERTION OF MESH;  Surgeon: Armandina Gemma, MD;  Location: [facility];  Service: General;  Laterality: Left;   MELANOMA EXCISION Left 05/05/2007   '@[facility]'$  ;   LEFT FOREARM   TONSILLECTOMY     child   TOTAL HIP ARTHROPLASTY Left  02/11/2021   Procedure: TOTAL HIP ARTHROPLASTY;  Surgeon: Marchia Bond, MD;  Location: [facility];  Service: Orthopedics;  Laterality: Left;   TOTAL SHOULDER ARTHROPLASTY Right 12/29/2017   Procedure: TOTAL SHOULDER ARTHROPLASTY;  Surgeon: Hiram Gash, MD;  Location: [place];  Service: Orthopedics;  Laterality: Right;   HPI:  Joseph Simpson is a 87 y.o. male who presented to ED 12/12 with generalized weakness for a week and nausea and vomiting for 1 day. No remarkable findings; pt d/c'd to home.  At home, his  symptoms recurred and he returned to ED 12/14. CXR 12/14 with no acute findings.  Abdominal CT with possible gastritis.  Pt with PMH significant for HTN, HLD, A-fib, CAD, who had partial gastrectomy and gastrojejunostomy 40 years ago for a bleeding ulcer, severe esophagitis on EGD 12/2021, nephrolithiasis, OSA, B12 deficiency, polyneuropathy.    Assessment / Plan / Recommendation  Clinical Impression  Pt presents with functional swallowing as assessed clinically.  Pt tolerated all consistencies trialed including serial straw sips with no clinical s/s of aspiration.  Pt exhibited good oral clearance of solids.  No sensation of stasis or backflow during today's assessment. Pt with hx of gastrectomy, with esophagitis on EGD in February.  Pt states he does not take reflux medication.  Pt may benefit from further GI assessment for esophageal dysphagia if symptoms persist.    Pt does not have any further ST needs at this time, SLP will sign off. Recommend continuing regular texture diet with thin liquid.     Of note: pt with leaking IV at wrist.  RN notified.  SLP Visit Diagnosis: Dysphagia, unspecified (R13.10)    Aspiration Risk  No limitations    Diet Recommendation Regular;Thin liquid   Medication Administration: Whole meds with liquid Supervision: Patient able to self feed Compensations: Slow rate;Small sips/bites Postural Changes: Seated upright at 90 degrees    Other  Recommendations Recommended Consults: Consider GI evaluation Oral Care Recommendations: Oral care BID    Recommendations for follow up therapy are one component of a multi-disciplinary discharge planning process, led by the attending physician.  Recommendations may be updated based on patient status, additional functional criteria and insurance authorization.  Follow up Recommendations No SLP follow up      Assistance Recommended at Discharge    Functional Status Assessment Patient has had a recent decline in their  functional status and demonstrates the ability to make significant improvements in function in a reasonable and predictable amount of time.  Frequency and Duration  (N/A)          Prognosis Prognosis for Safe Diet Advancement:  (N/A)      Swallow Study   General Date of Onset: 10/29/22 HPI: NASER SCHULD is a 87 y.o. male who presented to ED 12/12 with generalized weakness for a week and nausea and vomiting for 1 day. No remarkable findings; pt d/c'd to home.  At home, his symptoms recurred and he returned to ED 12/14. CXR 12/14 with no acute findings.  Abdominal CT with possible gastritis.  Pt with PMH significant for HTN, HLD, A-fib, CAD, who had partial gastrectomy and gastrojejunostomy 40 years ago for a bleeding ulcer, severe esophagitis on EGD 12/2021, nephrolithiasis, OSA, B12 deficiency, polyneuropathy.    Oral/Motor/Sensory Function Overall Oral Motor/Sensory Function: Within functional limits Facial ROM: Within Functional Limits Facial Symmetry: Within Functional Limits Lingual ROM: Within Functional Limits Lingual Symmetry: Within Functional Limits Lingual Strength: Within Functional Limits Velum: Within Functional Limits Mandible: Within Functional  Limits   Ice Chips Ice chips: Not tested   Thin Liquid Thin Liquid: Within functional limits Presentation: Straw    Nectar Thick Nectar Thick Liquid: Not tested   Honey Thick Honey Thick Liquid: Not tested   Puree Puree: Within functional limits Presentation: Spoon   Solid     Solid: Within functional limits Presentation: [place], [place], [place] Office: [phone] 10/31/2022,11:44 AM

## 2022-10-31 NOTE — Progress Notes (Signed)
PROGRESS NOTE  Joseph Simpson  DOB: October 17, 1935  PCP: Marin Olp, MD ASN:[id number]  DOA: 10/29/2022  LOS: 1 day  Hospital Day: 3  Brief narrative: Joseph Simpson is a 87 y.o. male with PMH significant for HTN, HLD, A-fib, CAD, who had partial gastrectomy and gastrojejunostomy 40 years ago for a bleeding ulcer, severe esophagitis on EGD 12/2021, nephrolithiasis, OSA, B12 deficiency, polyneuropathy.. 12/12, patient was brought to the ED from home with generalized weakness for a week and nausea and vomiting for 1 day.  CT abdomen pelvis unremarkable.  Labs within normal limit.  Respiratory virus panel negative.  He was given symptomatic management with Compazine, Benadryl and was discharged to home.   At home, his symptoms recurred.  He started having nausea and vomiting again.  He was unable to hold any food down, started having severe epigastric pain 9/10 radiating to the back and hence returned back to ED again on 12/14. In the ED, he was afebrile, heart rate in 70s, blood pressure 180/81, breathing on room air Labs with WBC count 11.6, glucose 181, renal function normal, troponin normal UTI with clear yellow urine with negative nitrites, leukocyte Chest x-ray showed chronic bronchitic changes and no acute infiltrates. CT angio abdomen pelvis did not show acute vascular findings, which showed patent mesenteric vasculature without stenosis.  But it also suspected a 4 mm stone at the right ureterovesicular junction with prominence of the right ureter. No frank hydronephrosis. Additional bilateral nonobstructing renal calculi.  It showed enlarged prostate causing mass effect on the bladder base with bladder wall trabeculation and diverticula consistent with chronic bladder outlet obstruction  We also showed post partial gastrectomy and gastrojejunostomy. Fluid within the stomach with equivocal gastric wall thickening, can be seen with gastritis. Colonic diverticulosis without  diverticulitis. Admitted to [facility]  Subjective: Patient was seen and examined this morning.  Feels somewhat better today and was able to hold breakfast this morning. Pending speech eval today  Assessment and plan: Intractable nausea, vomiting, poor oral intake History of severe esophagitis Prior history of partial gastrectomy and gastrojejunostomy had partial gastrectomy and gastrojejunostomy 40 years ago for a bleeding ulcer. In an EGD in Feb 2023, patient was found to have severe esophagitis. At home, he was on Protonix 40 mg twice daily.  It seems that in the past he was also tried on Reglan 5 mg twice daily. On exam yesterday, patient complained of fullness of the stomach and epigastric pain. No report of hematemesis, hematochezia, melena.  Hemoglobin stable. Currently continued on IV Protonix and IV Reglan. Improving oral appetite.  But he still feels food stuck.   Pending speech evaluation today.   Can stop IV hydration. Recent Labs  Lab 10/27/22 1222 10/29/22 0941 10/29/22 2118 10/30/22 0547  WBC 8.4 11.7* 13.3* 10.0   4 mm right UVJ stone with prominent right ureter BPH Chronic bladder outlet obstruction Patient has chronic urological issues and is on Flomax at home. I am not sure if the small size stone is causing any acute issue at this time.  If symptoms do not improve, will get urology consult.  Otherwise he can follow-up as an outpatient. Continue Flomax  Essential hypertension PTA on Lopressor 12.5 mg twice daily, losartan 25 mg daily as needed Blood pressure running elevated today.  Resume Lopressor and losartan both. Continue to monitor blood pressure.   A-fib Continue metoprolol.  Eliquis is on hold just in case any procedures required.   CAD, HLD PTA on Eliquis 5  mg twice daily, Lipitor 20 mg 3 times a week, Eliquis plan as above.  Continue Lipitor  Goals of care   Code Status: Full Code    Mobility: Encourage ambulation  Scheduled Meds:   atorvastatin  20 mg Oral Q M,W,F-2000   enoxaparin (LOVENOX) injection  80 mg Subcutaneous BID   losartan  25 mg Oral Daily   melatonin  5 mg Oral QHS   metoCLOPramide (REGLAN) injection  5 mg Intravenous Q12H   metoprolol tartrate  12.5 mg Oral BID   pantoprazole (PROTONIX) IV  40 mg Intravenous Q12H   tamsulosin  0.4 mg Oral Daily    PRN meds: metoprolol tartrate   Infusions:   sodium chloride 75 mL/hr at 10/31/22 1115    Skin assessment:     Nutritional status:  Body mass index is 22.69 kg/m.          Diet:  Diet Order             Diet regular Fluid consistency: Thin  Diet effective now                   DVT prophylaxis: SCDs Place and maintain sequential compression device Start: 10/30/22 1333  Antimicrobials: None Fluid: None Consultants: None Family Communication: Son at bedside  Status is: Inpatient Continue in-hospital care because: Pending reevaluation and may need barium swallowing Level of care: Med-Surg   Dispo: The patient is from: Home              Anticipated d/c is to: Pending clinical course              Patient currently is not medically stable to d/c.   Difficult to place patient No    Antimicrobials: Anti-infectives (From admission, onward)    None       Objective: Vitals:   10/31/22 0546 10/31/22 0955  BP: (!) 152/80   Pulse: (!) 54 80  Resp: 12   Temp: (!) 97.5 F (36.4 C)   SpO2: 98%     Intake/Output Summary (Last 24 hours) at 10/31/2022 1147 Last data filed at 10/31/2022 0750 Gross per 24 hour  Intake 1417.1 ml  Output 1460 ml  Net -42.9 ml   Filed Weights   10/29/22 2121  Weight: 78 kg   Weight change:  Body mass index is 22.69 kg/m.   Physical Exam: General exam: Pleasant, elderly Caucasian male.  Lying down in bed. Skin: No rashes, lesions or ulcers. HEENT: Atraumatic, normocephalic, no obvious bleeding Lungs: Clear to auscultation bilaterally. CVS: Regular rate and rhythm, no murmur GI/Abd  soft, tenderness present in epigastrium, nondistended, bowel sound present CNS: Alert, awake, oriented x 3, hard of hearing. Psychiatry: Mood appropriate Extremities: No pedal edema, no calf tenderness  Data Review: I have personally reviewed the laboratory data and studies available.  F/u labs ordered Unresulted Labs (From admission, onward)     Start     Ordered   11/05/22 0500  Creatinine, serum  (enoxaparin (LOVENOX)    CrCl < 30 ml/min)  Once,   R       Comments: while on enoxaparin therapy.    10/29/22 2104            Total time spent in review of labs and imaging, patient evaluation, formulation of plan, documentation and communication with family - 2 minutes  Signed, Terrilee Croak, MD [facility] 10/31/2022

## 2022-11-01 DIAGNOSIS — R112 Nausea with vomiting, unspecified: Secondary | ICD-10-CM | POA: Diagnosis not present

## 2022-11-01 NOTE — Progress Notes (Signed)
PROGRESS NOTE  Joseph Simpson  DOB: [birth year]  PCP: Marin Olp, MD GUY:[id number]  DOA: 10/29/2022  LOS: 2 days  Hospital Day: 4  Brief narrative: Joseph Simpson is a 87 y.o. male with PMH significant for HTN, HLD, A-fib, CAD, who had partial gastrectomy and gastrojejunostomy 40 years ago for a bleeding ulcer, severe esophagitis on EGD 12/2021, nephrolithiasis, OSA, B12 deficiency, polyneuropathy.. 12/12, patient was brought to the ED from home with generalized weakness for a week and nausea and vomiting for 1 day.  CT abdomen pelvis unremarkable.  Labs within normal limit.  Respiratory virus panel negative.  He was given symptomatic management with Compazine, Benadryl and was discharged to home.   At home, his symptoms recurred.  He started having nausea and vomiting again.  He was unable to hold any food down, started having severe epigastric pain 9/10 radiating to the back and hence returned back to ED again on 12/14. In the ED, he was afebrile, heart rate in 70s, blood pressure 180/81, breathing on room air Labs with WBC count 11.6, glucose 181, renal function normal, troponin normal UTI with clear yellow urine with negative nitrites, leukocyte Chest x-ray showed chronic bronchitic changes and no acute infiltrates. CT angio abdomen pelvis did not show acute vascular findings, which showed patent mesenteric vasculature without stenosis.  It also showed post partial gastrectomy and gastrojejunostomy. Fluid within the stomach with equivocal gastric wall thickening, can be seen with gastritis. Colonic diverticulosis without diverticulitis. Admitted to [facility]  Subjective: Patient was seen and examined this morning.   Does not feel good.  Able to tolerate appetite but complains of abdominal pain soon after eating. Also not able to urinate today.  800 ml of urine noted on bladder scan.  Assessment and plan: Intractable nausea, vomiting, poor oral intake History of severe esophagitis Prior  history of partial gastrectomy and gastrojejunostomy had partial gastrectomy and gastrojejunostomy 40 years ago for a bleeding ulcer. In an EGD in Feb 2023, patient was found to have severe esophagitis. At home, he was on Protonix 40 mg twice daily.  It seems that in the past he was also tried on Reglan 5 mg twice daily. Presented with nausea, vomiting, oral intake intolerance. CT scan showed gastritis related changes. Currently patient is on IV Protonix and IV every Reglan.  He is able to tolerate his diet better but continues to complain of fullness of the stomach and epigastric pain. Does not feel comfortable going home. Speech evaluation was obtained.  No risk of aspiration noted.  May need further GI assessment for esophageal dysphagia. GI consultation called.  Recommended clear liquid diet for now n.p.o. after midnight for possible EGD tomorrow Recent Labs  Lab 10/27/22 1222 10/29/22 0941 10/29/22 2118 10/30/22 0547  WBC 8.4 11.7* 13.3* 10.0   Acute urine retention Chronic bladder outlet obstruction due to BPH Right UVJ stone CT abdomen showed enlarged prostate causing mass effect on the bladder base with bladder wall trabeculation and diverticula.  It also showed a 4 mm stone at the right ureterovesicular junction with prominence of the right ureter. No frank hydronephrosis.  12/17, patient reported inability to pee.  Bladder scan showed 800 mL of urine retention.  Foley catheter inserted Continue Flomax. Urology evaluation as an outpatient  Essential hypertension PTA on Lopressor 12.5 mg twice daily, losartan 25 mg daily as needed Continue both.  Continue to monitor blood pressure.   A-fib Continue metoprolol.  Eliquis is on hold.  Currently on full dose  Lovenox.  Will hold tonight's dose.  CAD, HLD PTA on Eliquis 5 mg twice daily, Lipitor 20 mg 3 times a week, Eliquis plan as above.  Continue Lipitor.  Goals of care   Code Status: Full Code    Mobility: Encourage  ambulation  Scheduled Meds:  atorvastatin  20 mg Oral Q M,W,F-2000   losartan  25 mg Oral Daily   melatonin  5 mg Oral QHS   metoCLOPramide (REGLAN) injection  5 mg Intravenous Q12H   metoprolol tartrate  12.5 mg Oral BID   pantoprazole (PROTONIX) IV  40 mg Intravenous Q12H   polyvinyl alcohol  1 drop Both Eyes BID   tamsulosin  0.4 mg Oral Daily    PRN meds: metoprolol tartrate   Infusions:     Skin assessment:     Nutritional status:  Body mass index is 22.69 kg/m.          Diet:  Diet Order             Diet NPO time specified  Diet effective midnight           Diet clear liquid Room service appropriate? Yes; Fluid consistency: Thin  Diet effective now                   DVT prophylaxis: SCDs Place and maintain sequential compression device Start: 10/30/22 1333  Antimicrobials: None Fluid: None Consultants: None Family Communication: Son at bedside  Status is: Inpatient Continue in-hospital care because: Pending reevaluation.  Tentative plan of EGD tomorrow Level of care: Med-Surg   Dispo: The patient is from: Home              Anticipated d/c is to: Pending clinical course              Patient currently is not medically stable to d/c.   Difficult to place patient No    Antimicrobials: Anti-infectives (From admission, onward)    None       Objective: Vitals:   11/01/22 0550 11/01/22 1319  BP: 132/66 (!) 140/72  Pulse: (!) 42 73  Resp: 15   Temp: 97.6 F (36.4 C) 98.1 F (36.7 C)  SpO2: 98% 99%    Intake/Output Summary (Last 24 hours) at 11/01/2022 1359 Last data filed at 11/01/2022 1242 Gross per 24 hour  Intake 2352.4 ml  Output 1050 ml  Net 1302.4 ml   Filed Weights   10/29/22 2121  Weight: 78 kg   Weight change:  Body mass index is 22.69 kg/m.   Physical Exam: General exam: Pleasant, elderly Caucasian male. Lying down in bed. Skin: No rashes, lesions or ulcers. HEENT: Atraumatic, normocephalic, no obvious  bleeding Lungs: Clear to auscultation laterally CVS: Regular rate and rhythm, no murmur GI/Abd soft, tenderness present in epigastrium, mild distention present, bowel sound present CNS: Alert, awake, oriented x 3, hard of hearing. Psychiatry: Mood appropriate Extremities: No pedal edema, no calf tenderness  Data Review: I have personally reviewed the laboratory data and studies available.  F/u labs ordered Unresulted Labs (From admission, onward)    None       Total time spent in review of labs and imaging, patient evaluation, formulation of plan, documentation and communication with family - 65 minutes  Signed, Terrilee Croak, MD [facility] 11/01/2022

## 2022-11-02 ENCOUNTER — Encounter (HOSPITAL_COMMUNITY)
Admission: EM | Disposition: A | Discharge status: Home / Self Care | Payer: Self-pay | Source: Home / Self Care | Attending: Internal Medicine

## 2022-11-02 ENCOUNTER — Other Ambulatory Visit: Payer: Self-pay | Admitting: Gastroenterology

## 2022-11-02 ENCOUNTER — Inpatient Hospital Stay (HOSPITAL_COMMUNITY): Payer: Medicare Other | Admitting: Certified Registered Nurse Anesthetist

## 2022-11-02 ENCOUNTER — Encounter (HOSPITAL_COMMUNITY): Payer: Self-pay | Admitting: Internal Medicine

## 2022-11-02 ENCOUNTER — Encounter (INDEPENDENT_AMBULATORY_CARE_PROVIDER_SITE_OTHER): Payer: Medicare Other | Admitting: Ophthalmology

## 2022-11-02 DIAGNOSIS — K229 Disease of esophagus, unspecified: Secondary | ICD-10-CM | POA: Diagnosis not present

## 2022-11-02 DIAGNOSIS — Z7901 Long term (current) use of anticoagulants: Secondary | ICD-10-CM | POA: Diagnosis not present

## 2022-11-02 DIAGNOSIS — K449 Diaphragmatic hernia without obstruction or gangrene: Secondary | ICD-10-CM | POA: Diagnosis not present

## 2022-11-02 DIAGNOSIS — K2970 Gastritis, unspecified, without bleeding: Secondary | ICD-10-CM | POA: Diagnosis not present

## 2022-11-02 DIAGNOSIS — G4730 Sleep apnea, unspecified: Secondary | ICD-10-CM

## 2022-11-02 DIAGNOSIS — R112 Nausea with vomiting, unspecified: Secondary | ICD-10-CM | POA: Diagnosis not present

## 2022-11-02 DIAGNOSIS — K2100 Gastro-esophageal reflux disease with esophagitis, without bleeding: Secondary | ICD-10-CM | POA: Diagnosis not present

## 2022-11-02 DIAGNOSIS — R1013 Epigastric pain: Secondary | ICD-10-CM | POA: Diagnosis not present

## 2022-11-02 HISTORY — PX: ESOPHAGOGASTRODUODENOSCOPY (EGD) WITH PROPOFOL: SHX5813

## 2022-11-02 HISTORY — PX: BIOPSY: SHX5522

## 2022-11-02 SURGERY — ESOPHAGOGASTRODUODENOSCOPY (EGD) WITH PROPOFOL
Anesthesia: Monitor Anesthesia Care

## 2022-11-02 MED ORDER — LACTATED RINGERS IV SOLN: INTRAVENOUS | Status: DC

## 2022-11-02 MED ORDER — EPHEDRINE SULFATE-NACL 50-0.9 MG/10ML-% IV SOSY
PREFILLED_SYRINGE | INTRAVENOUS | Status: DC | PRN
  Administered 2022-11-02: 10 mg via INTRAVENOUS

## 2022-11-02 MED ORDER — PROPOFOL 500 MG/50ML IV EMUL
INTRAVENOUS | Status: DC | PRN
  Administered 2022-11-02: 75 ug/kg/min via INTRAVENOUS
  Administered 2022-11-02: 40 mg via INTRAVENOUS

## 2022-11-02 MED ORDER — PHENYLEPHRINE 80 MCG/ML (10ML) SYRINGE FOR IV PUSH (FOR BLOOD PRESSURE SUPPORT)
PREFILLED_SYRINGE | INTRAVENOUS | Status: DC | PRN
  Administered 2022-11-02 (×2): 80 ug via INTRAVENOUS

## 2022-11-02 MED ORDER — POTASSIUM CHLORIDE 20 MEQ PO PACK
40.0000 meq | PACK | Freq: Two times a day (BID) | ORAL | Status: DC
  Administered 2022-11-02 – 2022-11-03 (×3): 40 meq via ORAL
  Filled 2022-11-02 (×3): qty 2

## 2022-11-02 SURGICAL SUPPLY — 15 items

## 2022-11-02 NOTE — Consult Note (Addendum)
Consultation  Referring Provider:    Dr. Pietro Cassis Primary Care Physician:  Marin Olp, MD Primary Gastroenterologist:   Dr. Havery Moros      Reason for Consultation: Early satiety, nausea and vomiting         HPI:   Joseph Simpson is a 87 y.o. male with a past medical history significant for CAD (01/05/2022 echo with a EF 60-65%), BPH, hypertension, suspected gastroparesis in the past, hyperlipidemia and GERD, who presented to the ER on 10/29/2022.  Apparently had been seen 2 days prior and diagnosed with the flu and sent home.  Since then he had severe epigastric pain, nausea and some vomiting.  He had not been able to eat or drink or kept any food down.    10/27/2022 patient brought to the ED from home with generalized weakness, nausea and vomiting for a day, CTAP unremarkable, labs normal, respiratory virus panel negative, given symptomatic management with Compazine and Benadryl and discharged home.    10/29/2022, at time of admission patient rated his pain as a 9/10 that radiates through to his back and is very sharp.  Workup revealed this post partial gastrectomy and gastrojejunostomy, possible gastritis and an enlarged prostate.    11/01/2022 patient seen by hospitalist and did not feel good.  Complaining of abdominal pain soon after eating.  Apparently was not able to urinate.  He has been treated with IV Protonix and IV Reglan since admission.  Apparently is able to tolerate his diet better but continued with fullness of the stomach and epigastric pain.  He was made n.p.o. after midnight for possible EGD today.    Today, the patient is seen with his son by his bedside, he is actually doing much better over the past 48 hours, apparently prior to that really struggling to eat anything without feeling nauseous or having epigastric pain.  On Saturday he had his first normal meal and had increased pain afterwards.  Yesterday he was able to eat breakfast, lunch and dinner with little to no  problems and this morning feels okay though he has been n.p.o. after midnight in preparation for EGD.  Denies any new symptoms.    Denies fever, chills, weight loss or blood in his stool.  ED course: BP 180/81, labs with a WBC count 11.6, glucose 181, chest x-ray showed chronic lung cardiac changes and no acute infiltrates, CT angio of the abdomen pelvis did not show acute vascular findings and showed patent mesenteric vasculature without stenosis, did show partial gastrectomy and gastrojejunostomy and fluid-filled stomach with equivocal gastric wall thickening which could be seen with gastritis  GI history: 07/17/2022 office visit with Dr. Havery Moros: At that time discussed history of peptic ulcer disease status post Billroth II, GI bleed secondary to esophagitis, and at that time patient was on Protonix 40 mg twice daily as well as Reglan 5 mg twice a day; plan: At that time discussed repeat EGD though patient was high risk and wish to avoid any endoscopic procedures he was continued on Keppra tonics and Reglan 01/06/2022 EGD with Dr. Paulita Fujita: LA grade D (1 or more mucosal breaks involving at least 75% of esophageal circumference) esophagitis was found, diffuse moderate mucosal changes characterized by congestion found in the entire esophagus, significant mucus buildup in the distal esophagus without food impaction, Billroth II anatomy, diffuse moderately erythematous mucosa without bleeding in the entire examined remnant stomach, some gastric ulcerations and edema 2015 colonoscopy: Few small polyps  Past Medical History:  Diagnosis Date   Aortic atherosclerosis ([place])    Arthritis    Atrial fibrillation ([place])    B12 deficiency    Bladder calculi    CAD (coronary artery disease)    cardiologist--- dr Martinique;   abnormal nuclear stress test 11/ 2013;  s/p cardiac cath 10-21-2012 minimal nonobstructiive CAD w/ normal LVF   Diverticulosis    Dyslipidemia    Esophagitis    Full dentures     Gastroparesis    followed by [name] PA ( [facility] in [place]) s/p gastrectomy for ulcers in 1970s   GERD (gastroesophageal reflux disease)    Heart murmur    Hiatal hernia    History of adenomatous polyp of colon    History of kidney stones    History of melanoma 2008   s/p left forearn wide local excision 06/ 2008   History of syncope 2010   cardiologist --- dr Martinique;  (11-25-2021 per pt no syncope or near syncope since)   Hypertension    Macular degeneration of both eyes    Nocturia associated with benign prostatic hyperplasia    OSA (obstructive sleep apnea)    no cpap   Polyneuropathy    PVC's (premature ventricular contractions) 2010   06/ 2010 per event monitor SR w/ occasional PVCs and 4 beat SVT (in epic)   Wears glasses     Past Surgical History:  Procedure Laterality Date   APPENDECTOMY     child   CARDIAC CATHETERIZATION  10/2012   minimal nonobstructive CAD with normal LV function   CATARACT EXTRACTION W/ INTRAOCULAR LENS IMPLANT Bilateral    2009 approx   CYSTOSCOPY WITH LITHOLAPAXY N/A 07/13/2016   Procedure: CYSTOSCOPY WITH LITHOLAPAXY;  Surgeon: Franchot Gallo, MD;  Location: [facility];  Service: Urology;  Laterality: N/A;   CYSTOSCOPY WITH LITHOLAPAXY N/A 12/01/2021   Procedure: CYSTOSCOPY WITH LITHOLAPAXY;  Surgeon: Franchot Gallo, MD;  Location[facility];  Service: Urology;  Laterality: N/A;   CYSTOSCOPY/RETROGRADE/URETEROSCOPY/STONE EXTRACTION WITH BASKET  02/24/2012   Procedure: CYSTOSCOPY/RETROGRADE/URETEROSCOPY/STONE EXTRACTION WITH BASKET;  Surgeon: Franchot Gallo, MD;  Location: [facility];  Service: Urology;  Laterality: Bilateral;  1 HOUR   ESOPHAGOGASTRODUODENOSCOPY N/A 01/06/2022   Procedure: ESOPHAGOGASTRODUODENOSCOPY (EGD);  Surgeon: Arta Silence, MD;  Location: [facility];  Service: Gastroenterology;  Laterality: N/A;   EXTRACORPOREAL SHOCK WAVE LITHOTRIPSY  11/16/2011   _0    GASTRECTOMY      1970s  for ulcers   HEMORRHOID SURGERY     yrs ago   HOLMIUM LASER APPLICATION N/A 4/53/6468   Procedure: HOLMIUM LASER APPLICATION;  Surgeon: Franchot Gallo, MD;  Location: [facility];  Service: Urology;  Laterality: N/A;   INGUINAL HERNIA REPAIR Left 07/13/2016   Procedure: REPAIR LEFT INGUINAL HERNIA;  Surgeon: Armandina Gemma, MD;  Location: [facility];  Service: General;  Laterality: Left;   INSERTION OF MESH Left 07/13/2016   Procedure: INSERTION OF MESH;  Surgeon: Armandina Gemma, MD;  Location: [facility];  Service: General;  Laterality: Left;   MELANOMA EXCISION Left 05/05/2007   _1  ;   LEFT FOREARM   TONSILLECTOMY     child   TOTAL HIP ARTHROPLASTY Left 02/11/2021   Procedure: TOTAL HIP ARTHROPLASTY;  Surgeon: Marchia Bond, MD;  Location: [facility];  Service: Orthopedics;  Laterality: Left;   TOTAL SHOULDER ARTHROPLASTY Right 12/29/2017   Procedure: TOTAL SHOULDER ARTHROPLASTY;  Surgeon: Hiram Gash, MD;  Location: [facility];  Service: Orthopedics;  Laterality: Right;  Family History  Problem Relation Age of Onset   Heart disease Mother        CABG in her 30s   Pneumonia Father    Cancer Sister        breast   Lung cancer Brother        smoker    Social History   Tobacco Use   Smoking status: Former    Packs/day: 0.10    Years: 4.00    Total pack years: 0.40    Types: Cigarettes    Quit date: 09/03/1956    Years since quitting: 66.2   Smokeless tobacco: Never  Vaping Use   Vaping Use: Never used  Substance Use Topics   Alcohol use: No    Alcohol/week: 0.0 standard drinks of alcohol   Drug use: Never    Prior to Admission medications   Medication Sig Start Date End Date Taking? Authorizing Provider  acetaminophen (TYLENOL) 500 MG tablet Take 500-1,000 mg by mouth 2 (two) times daily as needed for mild pain, moderate pain or headache.   Yes [provider]  apixaban (ELIQUIS) 5 MG TABS tablet Take 1 tablet (5 mg total) by mouth 2 (two)  times daily. 02/24/22  Yes Martinique, Peter M, MD  atorvastatin (LIPITOR) 20 MG tablet TAKE ONE TABLET BY MOUTH THREE TIMES A WEEK Patient taking differently: Take 20 mg by mouth every Monday, Wednesday, and Friday at 8 PM. TAKE ONE TABLET BY MOUTH THREE TIMES A WEEK 12/18/21  Yes Marin Olp, MD  BENADRYL ALLERGY 25 MG tablet Take 12.5 mg by mouth See admin instructions. Take 12.5 mg by mouth every 8 hours when taking Prochlorperazine   Yes [provider]  bevacizumab (AVASTIN) 2.5 mg/0.1 mL SOLN 2.5 mg by Intravitreal route See admin instructions. Injection to eyes every 4 weeks - use with vigamox   Yes [provider]  Carboxymethylcellulose Sodium 1 % GEL Place 1 drop into both eyes at bedtime.   Yes [provider]  Cyanocobalamin 1000 MCG/ML KIT Inject 1,000 mcg as directed every 30 (thirty) days.   Yes [provider]  docusate sodium (COLACE) 100 MG capsule Take 200 mg by mouth at bedtime.   Yes [provider]  losartan (COZAAR) 25 MG tablet Take 0.5-1 tablets (12.5-25 mg total) by mouth daily. If blood pressure over 145 when you check- ok to take other half tablet once per day (so total of 55m per day) Patient taking differently: Take 12.5 mg by mouth See admin instructions. Take 12.5 mg by mouth once a day and if Systolic B/P number is over 145 when checked, may take an additional 12.5 mg once a day (max total of 25 mg/day) 09/21/22  Yes HMarin Olp MD  metoCLOPramide (REGLAN) 5 MG tablet TAKE 1 TABLET BY MOUTH TWICE A DAY Patient taking differently: Take 5 mg by mouth in the morning and at bedtime. 08/11/22  Yes Armbruster, SCarlota Raspberry MD  metoprolol tartrate (LOPRESSOR) 25 MG tablet Take 12.5 mg by mouth 2 (two) times daily.   Yes [provider]  polyethylene glycol (MIRALAX / GLYCOLAX) 17 g packet Take 17 g by mouth daily as needed for moderate constipation (mix and drink as directed).   Yes [provider]   prochlorperazine (COMPAZINE) 10 MG tablet Take 1 tablet (10 mg total) by mouth every 8 (eight) hours as needed for nausea or vomiting (take with 12.554mbenadryl please). 10/27/22  Yes Small, Brooke L, PA  Propylene Glycol (  SYSTANE BALANCE OP) Place 1 drop into both eyes in the morning and at bedtime.   Yes [provider]  tamsulosin (FLOMAX) 0.4 MG CAPS capsule Take 0.4 mg by mouth daily. 12/30/21  Yes [provider]  VIGAMOX 0.5 % ophthalmic solution Place 1 drop into both eyes See admin instructions. Instill every 4 weeks as directed - use with avastin, instill 1 drop into both eyes 4 times daily for 2 days after eye injections 05/28/14  Yes [provider]  pantoprazole (PROTONIX) 40 MG tablet TAKE 1 TABLET BY MOUTH TWICE A DAY 11/02/22   Armbruster, Carlota Raspberry, MD    Current Facility-Administered Medications  Medication Dose Route Frequency Provider Last Rate Last Admin   atorvastatin (LIPITOR) tablet 20 mg  20 mg Oral Q M,W,F-2000 Dahal, Binaya, MD   20 mg at 10/30/22 2004   losartan (COZAAR) tablet 25 mg  25 mg Oral Daily Dahal, Marlowe Aschoff, MD   25 mg at 11/01/22 0905   melatonin tablet 5 mg  5 mg Oral QHS Dahal, Marlowe Aschoff, MD   5 mg at 11/01/22 2111   metoCLOPramide (REGLAN) injection 5 mg  5 mg Intravenous Q12H Dahal, Marlowe Aschoff, MD   5 mg at 11/01/22 2111   metoprolol tartrate (LOPRESSOR) injection 5 mg  5 mg Intravenous Q6H PRN Elwyn Reach, MD       metoprolol tartrate (LOPRESSOR) tablet 12.5 mg  12.5 mg Oral BID Terrilee Croak, MD   12.5 mg at 11/01/22 2110   pantoprazole (PROTONIX) injection 40 mg  40 mg Intravenous Q12H Elwyn Reach, MD   40 mg at 11/01/22 2110   polyvinyl alcohol (LIQUIFILM TEARS) 1.4 % ophthalmic solution 1 drop  1 drop Both Eyes BID Terrilee Croak, MD   1 drop at 11/01/22 2111   tamsulosin (FLOMAX) capsule 0.4 mg  0.4 mg Oral Daily Dahal, Marlowe Aschoff, MD   0.4 mg at 11/01/22 0905    Allergies as of 10/29/2022 - Review Complete 10/29/2022   Allergen Reaction Noted   Tape Other (See Comments) 10/29/2022   Aspirin Other (See Comments) 11/02/2011   Percocet [oxycodone-acetaminophen] Nausea And Vomiting 11/16/2011   Vitamins [apatate] Other (See Comments) 04/22/2015     Review of Systems:    Constitutional: No weight loss, fever or chills Skin: No rash  Cardiovascular: No chest pain Respiratory: No SOB  Gastrointestinal: See HPI and otherwise negative Genitourinary: No dysuria Neurological: No headache, dizziness or syncope Musculoskeletal: No new muscle or joint pain Hematologic: No bleeding  Psychiatric: No history of depression or anxiety    Physical Exam:  Vital signs in last 24 hours: Temp:  [98.1 F (36.7 C)-98.2 F (36.8 C)] 98.1 F (36.7 C) (12/18 0550) Pulse Rate:  [69-78] 69 (12/18 0550) Resp:  [18] 18 (12/18 0550) BP: (135-140)/(72-83) 135/83 (12/18 0550) SpO2:  [96 %-99 %] 98 % (12/18 0550) Last BM Date : 10/31/22 General:   Pleasant elderly Caucasian male appears to be in NAD, Well developed, Well nourished, alert and cooperative Head:  Normocephalic and atraumatic. Eyes:   PEERL, EOMI. No icterus. Conjunctiva pink. Ears:  Normal auditory acuity. Neck:  Supple Throat: Oral cavity and pharynx without inflammation, swelling or lesion.  Lungs: Respirations even and unlabored. Lungs clear to auscultation bilaterally.   No wheezes, crackles, or rhonchi.  Heart: Normal S1, S2. No MRG. Regular rate and rhythm. No peripheral edema, cyanosis or pallor.  Abdomen:  Soft, nondistended, nontender. No rebound or guarding. Normal bowel sounds. No appreciable masses or hepatomegaly.  Urinary: +urinary catheter in place Rectal:  Not performed.  Msk:  Symmetrical without gross deformities. Peripheral pulses intact.  Extremities:  Without edema, no deformity or joint abnormality.  Neurologic:  Alert and  oriented x4;  grossly normal neurologically.  Skin:   Dry and intact without significant lesions or  rashes. Psychiatric: Demonstrates good judgement and reason without abnormal affect or behaviors.  See HPI for labs.   Impression / Plan:   Impression: 1.  Intractable nausea and vomiting with poor oral intake: CT with possible gastritis, some improvement with IV Protonix and IV Reglan over the past 4 days, actually patient seems much improved over the past 24 hours able to tolerate his regular diet yesterday per him with little to no abdominal discomfort and no further nausea/vomiting, prior history of gastritis and LA grade D esophagitis in February of this year; likely acute gastritis worsened by viral infection +/- known esophagitis 2.  History of partial gastrectomy and gastrojejunostomy 3.  Urinary retention: Chronic bladder outlet obstruction due to BPH and a right UVJ stone, urinary catheter in place 4.  A-fib: Eliquis on hold, currently on full dose Lovenox-last dose 11/01/2308 100 5.  CAD 6.  Hypokalemia: Potassium 3.2 today  Plan: 1.  Ordered 40 mEq of p.o. potassium twice today 2.  Plans for EGD later this afternoon with Dr. Fuller Plan.  Did discuss risks, benefits, limitations and alternatives and the patient agrees to proceed. 3.  Continue Pantoprazole 40 mg IV twice daily 4.  Continue Reglan 5 mg IV Q12 5.  Last dose of Lovenox was 11/01/2022 at 0900 6.  Patient does seem improved symptomatically today in comparison to yesterday, pending results from EGD would likely be able to be discharged home hopefully within the next 24 hours 7.  Await further recommendations from Dr. Fuller Plan after time of procedure.  Thank you for your kind consultation, we will continue to follow.  Lavone Nian Lemmon  11/02/2022, 9:11 AM     Attending Physician Note   I have taken a history, reviewed the chart and examined the patient. I performed more than 50% of this encounter in conjunction with the APP. I agree with the APP's note, impression and recommendations with my edits. My additional  impressions and recommendations are as follows.   *Epigastric pain, persistent nausea / vomiting. S/P partial gastrectomy, gastrojejunostomy. CTA without vascular stenosis and with equivocal gastric wall thickening, gastric fluid. R/O esophagitis, ulcer, partial obstruction, other. Continue IV pantoprazole and metoclopramide for now. EGD today.   *Afib, Eliquis on hold  Lucio Edward, MD [facility] See Shea Evans, [facility], for our on call provider

## 2022-11-02 NOTE — Anesthesia Postprocedure Evaluation (Signed)
Anesthesia Post Note  Patient: Joseph Simpson  Procedure(s) Performed: ESOPHAGOGASTRODUODENOSCOPY (EGD) WITH PROPOFOL BIOPSY     Patient location during evaluation: Endoscopy Anesthesia Type: MAC Level of consciousness: awake Pain management: pain level controlled Vital Signs Assessment: post-procedure vital signs reviewed and stable Respiratory status: spontaneous breathing Cardiovascular status: stable Postop Assessment: no apparent nausea or vomiting Anesthetic complications: no   No notable events documented.  Last Vitals:  Vitals:   11/02/22 1456 11/02/22 1543  BP: (!) 124/59 138/71  Pulse: 71 66  Resp: 14 18  Temp:  (!) 36.3 C  SpO2: 98% 100%    Last Pain:  Vitals:   11/02/22 1543  TempSrc: Oral  PainSc:                  Laurajean Hosek

## 2022-11-02 NOTE — Anesthesia Preprocedure Evaluation (Addendum)
Anesthesia Evaluation  Patient identified by MRN, date of birth, ID band Patient awake    Reviewed: Allergy & Precautions, NPO status , Patient's Chart, lab work & pertinent test results  Airway Mallampati: II  TM Distance: >3 FB Neck ROM: Full    Dental  (+) Upper Dentures, Lower Dentures   Pulmonary sleep apnea , former smoker   Pulmonary exam normal        Cardiovascular hypertension, Pt. on medications and Pt. on home beta blockers + CAD  + dysrhythmias Atrial Fibrillation  Rhythm:Regular Rate:Normal     Neuro/Psych negative neurological ROS  negative psych ROS   GI/Hepatic Neg liver ROS, hiatal hernia,GERD  Medicated,,  Endo/Other  negative endocrine ROS    Renal/GU negative Renal ROS  negative genitourinary   Musculoskeletal  (+) Arthritis , Osteoarthritis,    Abdominal Normal abdominal exam  (+)   Peds  Hematology negative hematology ROS (+)   Anesthesia Other Findings   Reproductive/Obstetrics                             Anesthesia Physical Anesthesia Plan  ASA: 3  Anesthesia Plan: MAC   Post-op Pain Management:    Induction: Intravenous  PONV Risk Score and Plan: 1 and Propofol infusion and Treatment may vary due to age or medical condition  Airway Management Planned: Simple Face Mask, Natural Airway and Nasal Cannula  Additional Equipment: None  Intra-op Plan:   Post-operative Plan:   Informed Consent: I have reviewed the patients History and Physical, chart, labs and discussed the procedure including the risks, benefits and alternatives for the proposed anesthesia with the patient or authorized representative who has indicated his/her understanding and acceptance.     Dental advisory given  Plan Discussed with: CRNA  Anesthesia Plan Comments:        Anesthesia Quick Evaluation

## 2022-11-02 NOTE — H&P (View-Only) (Signed)
Consultation  Referring Provider:    Dr. Pietro Cassis Primary Care Physician:  Marin Olp, MD Primary Gastroenterologist:   Dr. Havery Moros      Reason for Consultation: Early satiety, nausea and vomiting         HPI:   Joseph Simpson is a 87 y.o. male with a past medical history significant for CAD (01/05/2022 echo with a EF 60-65%), BPH, hypertension, suspected gastroparesis in the past, hyperlipidemia and GERD, who presented to the ER on 10/29/2022.  Apparently had been seen 2 days prior and diagnosed with the flu and sent home.  Since then he had severe epigastric pain, nausea and some vomiting.  He had not been able to eat or drink or kept any food down.    10/27/2022 patient brought to the ED from home with generalized weakness, nausea and vomiting for a day, CTAP unremarkable, labs normal, respiratory virus panel negative, given symptomatic management with Compazine and Benadryl and discharged home.    10/29/2022, at time of admission patient rated his pain as a 9/10 that radiates through to his back and is very sharp.  Workup revealed this post partial gastrectomy and gastrojejunostomy, possible gastritis and an enlarged prostate.    11/01/2022 patient seen by hospitalist and did not feel good.  Complaining of abdominal pain soon after eating.  Apparently was not able to urinate.  He has been treated with IV Protonix and IV Reglan since admission.  Apparently is able to tolerate his diet better but continued with fullness of the stomach and epigastric pain.  He was made n.p.o. after midnight for possible EGD today.    Today, the patient is seen with his son by his bedside, he is actually doing much better over the past 48 hours, apparently prior to that really struggling to eat anything without feeling nauseous or having epigastric pain.  On Saturday he had his first normal meal and had increased pain afterwards.  Yesterday he was able to eat breakfast, lunch and dinner with little to no  problems and this morning feels okay though he has been n.p.o. after midnight in preparation for EGD.  Denies any new symptoms.    Denies fever, chills, weight loss or blood in his stool.  ED course: BP 180/81, labs with a WBC count 11.6, glucose 181, chest x-ray showed chronic lung cardiac changes and no acute infiltrates, CT angio of the abdomen pelvis did not show acute vascular findings and showed patent mesenteric vasculature without stenosis, did show partial gastrectomy and gastrojejunostomy and fluid-filled stomach with equivocal gastric wall thickening which could be seen with gastritis  GI history: 07/17/2022 office visit with Dr. Havery Moros: At that time discussed history of peptic ulcer disease status post Billroth II, GI bleed secondary to esophagitis, and at that time patient was on Protonix 40 mg twice daily as well as Reglan 5 mg twice a day; plan: At that time discussed repeat EGD though patient was high risk and wish to avoid any endoscopic procedures he was continued on Keppra tonics and Reglan 01/06/2022 EGD with Dr. Paulita Fujita: LA grade D (1 or more mucosal breaks involving at least 75% of esophageal circumference) esophagitis was found, diffuse moderate mucosal changes characterized by congestion found in the entire esophagus, significant mucus buildup in the distal esophagus without food impaction, Billroth II anatomy, diffuse moderately erythematous mucosa without bleeding in the entire examined remnant stomach, some gastric ulcerations and edema 2015 colonoscopy: Few small polyps  Past Medical History:  Diagnosis Date   Aortic atherosclerosis ([place])    Arthritis    Atrial fibrillation ([place])    B12 deficiency    Bladder calculi    CAD (coronary artery disease)    cardiologist--- dr Martinique;   abnormal nuclear stress test 11/ 2013;  s/p cardiac cath 10-21-2012 minimal nonobstructiive CAD w/ normal LVF   Diverticulosis    Dyslipidemia    Esophagitis    Full dentures     Gastroparesis    followed by [name] PA ( [facility] in [place]) s/p gastrectomy for ulcers in 1970s   GERD (gastroesophageal reflux disease)    Heart murmur    Hiatal hernia    History of adenomatous polyp of colon    History of kidney stones    History of melanoma 2008   s/p left forearn wide local excision 06/ 2008   History of syncope 2010   cardiologist --- dr Martinique;  (11-25-2021 per pt no syncope or near syncope since)   Hypertension    Macular degeneration of both eyes    Nocturia associated with benign prostatic hyperplasia    OSA (obstructive sleep apnea)    no cpap   Polyneuropathy    PVC's (premature ventricular contractions) 2010   06/ 2010 per event monitor SR w/ occasional PVCs and 4 beat SVT (in epic)   Wears glasses     Past Surgical History:  Procedure Laterality Date   APPENDECTOMY     child   CARDIAC CATHETERIZATION  10/2012   minimal nonobstructive CAD with normal LV function   CATARACT EXTRACTION W/ INTRAOCULAR LENS IMPLANT Bilateral    2009 approx   CYSTOSCOPY WITH LITHOLAPAXY N/A 07/13/2016   Procedure: CYSTOSCOPY WITH LITHOLAPAXY;  Surgeon: Franchot Gallo, MD;  Location: [facility];  Service: Urology;  Laterality: N/A;   CYSTOSCOPY WITH LITHOLAPAXY N/A 12/01/2021   Procedure: CYSTOSCOPY WITH LITHOLAPAXY;  Surgeon: Franchot Gallo, MD;  Location: [facility];  Service: Urology;  Laterality: N/A;   CYSTOSCOPY/RETROGRADE/URETEROSCOPY/STONE EXTRACTION WITH BASKET  02/24/2012   Procedure: CYSTOSCOPY/RETROGRADE/URETEROSCOPY/STONE EXTRACTION WITH BASKET;  Surgeon: Franchot Gallo, MD;  Location: [facility];  Service: Urology;  Laterality: Bilateral;  1 HOUR   ESOPHAGOGASTRODUODENOSCOPY N/A 01/06/2022   Procedure: ESOPHAGOGASTRODUODENOSCOPY (EGD);  Surgeon: Arta Silence, MD;  Location: [facility];  Service: Gastroenterology;  Laterality: N/A;   EXTRACORPOREAL SHOCK WAVE LITHOTRIPSY  11/16/2011   _0    GASTRECTOMY      1970s  for ulcers   HEMORRHOID SURGERY     yrs ago   HOLMIUM LASER APPLICATION N/A 4/53/6468   Procedure: HOLMIUM LASER APPLICATION;  Surgeon: Franchot Gallo, MD;  Location: [facility];  Service: Urology;  Laterality: N/A;   INGUINAL HERNIA REPAIR Left 07/13/2016   Procedure: REPAIR LEFT INGUINAL HERNIA;  Surgeon: Armandina Gemma, MD;  Location: [facility];  Service: General;  Laterality: Left;   INSERTION OF MESH Left 07/13/2016   Procedure: INSERTION OF MESH;  Surgeon: Armandina Gemma, MD;  Location: [facility];  Service: General;  Laterality: Left;   MELANOMA EXCISION Left 05/05/2007   _1  ;   LEFT FOREARM   TONSILLECTOMY     child   TOTAL HIP ARTHROPLASTY Left 02/11/2021   Procedure: TOTAL HIP ARTHROPLASTY;  Surgeon: Marchia Bond, MD;  Location: [facility];  Service: Orthopedics;  Laterality: Left;   TOTAL SHOULDER ARTHROPLASTY Right 12/29/2017   Procedure: TOTAL SHOULDER ARTHROPLASTY;  Surgeon: Hiram Gash, MD;  Location: [place];  Service: Orthopedics;  Laterality: Right;  Family History  Problem Relation Age of Onset   Heart disease Mother        CABG in her 30s   Pneumonia Father    Cancer Sister        breast   Lung cancer Brother        smoker    Social History   Tobacco Use   Smoking status: Former    Packs/day: 0.10    Years: 4.00    Total pack years: 0.40    Types: Cigarettes    Quit date: 09/03/1956    Years since quitting: 66.2   Smokeless tobacco: Never  Vaping Use   Vaping Use: Never used  Substance Use Topics   Alcohol use: No    Alcohol/week: 0.0 standard drinks of alcohol   Drug use: Never    Prior to Admission medications   Medication Sig Start Date End Date Taking? Authorizing Provider  acetaminophen (TYLENOL) 500 MG tablet Take 500-1,000 mg by mouth 2 (two) times daily as needed for mild pain, moderate pain or headache.   Yes [provider]  apixaban (ELIQUIS) 5 MG TABS tablet Take 1 tablet (5 mg total) by mouth 2 (two)  times daily. 02/24/22  Yes Martinique, Peter M, MD  atorvastatin (LIPITOR) 20 MG tablet TAKE ONE TABLET BY MOUTH THREE TIMES A WEEK Patient taking differently: Take 20 mg by mouth every Monday, Wednesday, and Friday at 8 PM. TAKE ONE TABLET BY MOUTH THREE TIMES A WEEK 12/18/21  Yes Marin Olp, MD  BENADRYL ALLERGY 25 MG tablet Take 12.5 mg by mouth See admin instructions. Take 12.5 mg by mouth every 8 hours when taking Prochlorperazine   Yes [provider]  bevacizumab (AVASTIN) 2.5 mg/0.1 mL SOLN 2.5 mg by Intravitreal route See admin instructions. Injection to eyes every 4 weeks - use with vigamox   Yes [provider]  Carboxymethylcellulose Sodium 1 % GEL Place 1 drop into both eyes at bedtime.   Yes [provider]  Cyanocobalamin 1000 MCG/ML KIT Inject 1,000 mcg as directed every 30 (thirty) days.   Yes [provider]  docusate sodium (COLACE) 100 MG capsule Take 200 mg by mouth at bedtime.   Yes [provider]  losartan (COZAAR) 25 MG tablet Take 0.5-1 tablets (12.5-25 mg total) by mouth daily. If blood pressure over 145 when you check- ok to take other half tablet once per day (so total of 55m per day) Patient taking differently: Take 12.5 mg by mouth See admin instructions. Take 12.5 mg by mouth once a day and if Systolic B/P number is over 145 when checked, may take an additional 12.5 mg once a day (max total of 25 mg/day) 09/21/22  Yes HMarin Olp MD  metoCLOPramide (REGLAN) 5 MG tablet TAKE 1 TABLET BY MOUTH TWICE A DAY Patient taking differently: Take 5 mg by mouth in the morning and at bedtime. 08/11/22  Yes Armbruster, SCarlota Raspberry MD  metoprolol tartrate (LOPRESSOR) 25 MG tablet Take 12.5 mg by mouth 2 (two) times daily.   Yes [provider]  polyethylene glycol (MIRALAX / GLYCOLAX) 17 g packet Take 17 g by mouth daily as needed for moderate constipation (mix and drink as directed).   Yes [provider]   prochlorperazine (COMPAZINE) 10 MG tablet Take 1 tablet (10 mg total) by mouth every 8 (eight) hours as needed for nausea or vomiting (take with 12.554mbenadryl please). 10/27/22  Yes Small, Brooke L, PA  Propylene Glycol (  SYSTANE BALANCE OP) Place 1 drop into both eyes in the morning and at bedtime.   Yes [provider]  tamsulosin (FLOMAX) 0.4 MG CAPS capsule Take 0.4 mg by mouth daily. 12/30/21  Yes [provider]  VIGAMOX 0.5 % ophthalmic solution Place 1 drop into both eyes See admin instructions. Instill every 4 weeks as directed - use with avastin, instill 1 drop into both eyes 4 times daily for 2 days after eye injections 05/28/14  Yes [provider]  pantoprazole (PROTONIX) 40 MG tablet TAKE 1 TABLET BY MOUTH TWICE A DAY 11/02/22   Armbruster, Carlota Raspberry, MD    Current Facility-Administered Medications  Medication Dose Route Frequency Provider Last Rate Last Admin   atorvastatin (LIPITOR) tablet 20 mg  20 mg Oral Q M,W,F-2000 Dahal, Binaya, MD   20 mg at 10/30/22 2004   losartan (COZAAR) tablet 25 mg  25 mg Oral Daily Dahal, Marlowe Aschoff, MD   25 mg at 11/01/22 0905   melatonin tablet 5 mg  5 mg Oral QHS Dahal, Marlowe Aschoff, MD   5 mg at 11/01/22 2111   metoCLOPramide (REGLAN) injection 5 mg  5 mg Intravenous Q12H Dahal, Marlowe Aschoff, MD   5 mg at 11/01/22 2111   metoprolol tartrate (LOPRESSOR) injection 5 mg  5 mg Intravenous Q6H PRN Elwyn Reach, MD       metoprolol tartrate (LOPRESSOR) tablet 12.5 mg  12.5 mg Oral BID Terrilee Croak, MD   12.5 mg at 11/01/22 2110   pantoprazole (PROTONIX) injection 40 mg  40 mg Intravenous Q12H Elwyn Reach, MD   40 mg at 11/01/22 2110   polyvinyl alcohol (LIQUIFILM TEARS) 1.4 % ophthalmic solution 1 drop  1 drop Both Eyes BID Terrilee Croak, MD   1 drop at 11/01/22 2111   tamsulosin (FLOMAX) capsule 0.4 mg  0.4 mg Oral Daily Dahal, Marlowe Aschoff, MD   0.4 mg at 11/01/22 0905    Allergies as of 10/29/2022 - Review Complete 10/29/2022   Allergen Reaction Noted   Tape Other (See Comments) 10/29/2022   Aspirin Other (See Comments) 11/02/2011   Percocet [oxycodone-acetaminophen] Nausea And Vomiting 11/16/2011   Vitamins [apatate] Other (See Comments) 04/22/2015     Review of Systems:    Constitutional: No weight loss, fever or chills Skin: No rash  Cardiovascular: No chest pain Respiratory: No SOB  Gastrointestinal: See HPI and otherwise negative Genitourinary: No dysuria Neurological: No headache, dizziness or syncope Musculoskeletal: No new muscle or joint pain Hematologic: No bleeding  Psychiatric: No history of depression or anxiety    Physical Exam:  Vital signs in last 24 hours: Temp:  [98.1 F (36.7 C)-98.2 F (36.8 C)] 98.1 F (36.7 C) (12/18 0550) Pulse Rate:  [69-78] 69 (12/18 0550) Resp:  [18] 18 (12/18 0550) BP: (135-140)/(72-83) 135/83 (12/18 0550) SpO2:  [96 %-99 %] 98 % (12/18 0550) Last BM Date : 10/31/22 General:   Pleasant elderly Caucasian male appears to be in NAD, Well developed, Well nourished, alert and cooperative Head:  Normocephalic and atraumatic. Eyes:   PEERL, EOMI. No icterus. Conjunctiva pink. Ears:  Normal auditory acuity. Neck:  Supple Throat: Oral cavity and pharynx without inflammation, swelling or lesion.  Lungs: Respirations even and unlabored. Lungs clear to auscultation bilaterally.   No wheezes, crackles, or rhonchi.  Heart: Normal S1, S2. No MRG. Regular rate and rhythm. No peripheral edema, cyanosis or pallor.  Abdomen:  Soft, nondistended, nontender. No rebound or guarding. Normal bowel sounds. No appreciable masses or hepatomegaly.  Urinary: +urinary catheter in place Rectal:  Not performed.  Msk:  Symmetrical without gross deformities. Peripheral pulses intact.  Extremities:  Without edema, no deformity or joint abnormality.  Neurologic:  Alert and  oriented x4;  grossly normal neurologically.  Skin:   Dry and intact without significant lesions or  rashes. Psychiatric: Demonstrates good judgement and reason without abnormal affect or behaviors.  See HPI for labs.   Impression / Plan:   Impression: 1.  Intractable nausea and vomiting with poor oral intake: CT with possible gastritis, some improvement with IV Protonix and IV Reglan over the past 4 days, actually patient seems much improved over the past 24 hours able to tolerate his regular diet yesterday per him with little to no abdominal discomfort and no further nausea/vomiting, prior history of gastritis and LA grade D esophagitis in February of this year; likely acute gastritis worsened by viral infection +/- known esophagitis 2.  History of partial gastrectomy and gastrojejunostomy 3.  Urinary retention: Chronic bladder outlet obstruction due to BPH and a right UVJ stone, urinary catheter in place 4.  A-fib: Eliquis on hold, currently on full dose Lovenox-last dose 11/01/2308 100 5.  CAD 6.  Hypokalemia: Potassium 3.2 today  Plan: 1.  Ordered 40 mEq of p.o. potassium twice today 2.  Plans for EGD later this afternoon with Dr. Fuller Plan.  Did discuss risks, benefits, limitations and alternatives and the patient agrees to proceed. 3.  Continue Pantoprazole 40 mg IV twice daily 4.  Continue Reglan 5 mg IV Q12 5.  Last dose of Lovenox was 11/01/2022 at 0900 6.  Patient does seem improved symptomatically today in comparison to yesterday, pending results from EGD would likely be able to be discharged home hopefully within the next 24 hours 7.  Await further recommendations from Dr. Fuller Plan after time of procedure.  Thank you for your kind consultation, we will continue to follow.  Joseph Simpson  11/02/2022, 9:11 AM     Attending Physician Note   I have taken a history, reviewed the chart and examined the patient. I performed more than 50% of this encounter in conjunction with the APP. I agree with the APP's note, impression and recommendations with my edits. My additional  impressions and recommendations are as follows.   *Epigastric pain, persistent nausea / vomiting. S/P partial gastrectomy, gastrojejunostomy. CTA without vascular stenosis, equivocal gastric wall thickening, gastric fluid. R/O esophagitis, ulcer, partial obstruction, other.  *Afib, Eliquis on hold  Lucio Edward, MD [facility] See Shea Evans, [facility], for our on call provider

## 2022-11-02 NOTE — Anesthesia Postprocedure Evaluation (Signed)
Anesthesia Post Note  Patient: Joseph Simpson  Procedure(s) Performed: ESOPHAGOGASTRODUODENOSCOPY (EGD) WITH PROPOFOL BIOPSY     Anesthesia Type: MAC Anesthetic complications: no   No notable events documented.  Last Vitals:  Vitals:   11/02/22 1456 11/02/22 1543  BP: (!) 124/59 138/71  Pulse: 71 66  Resp: 14 18  Temp:  (!) 36.3 C  SpO2: 98% 100%    Last Pain:  Vitals:   11/02/22 1543  TempSrc: Oral  PainSc:                  Resha Filippone

## 2022-11-02 NOTE — Progress Notes (Signed)
PROGRESS NOTE  Joseph Simpson  DOB: Apr 20, 1935  PCP: Marin Olp, MD FVC:[id number]  DOA: 10/29/2022  LOS: 3 days  Hospital Day: 5  Brief narrative: Joseph Simpson is a 87 y.o. male with PMH significant for HTN, HLD, A-fib, CAD, who had partial gastrectomy and gastrojejunostomy 40 years ago for a bleeding ulcer, severe esophagitis on EGD 12/2021, nephrolithiasis, OSA, B12 deficiency, polyneuropathy.. 12/12, patient was brought to the ED from home with generalized weakness for a week and nausea and vomiting for 1 day.  CT abdomen pelvis unremarkable.  Labs within normal limit.  Respiratory virus panel negative.  He was given symptomatic management with Compazine, Benadryl and was discharged to home.   At home, his symptoms recurred.  He started having nausea and vomiting again.  He was unable to hold any food down, started having severe epigastric pain 9/10 radiating to the back and hence returned back to ED again on 12/14. In the ED, he was afebrile, heart rate in 70s, blood pressure 180/81, breathing on room air Labs with WBC count 11.6, glucose 181, renal function normal, troponin normal UTI with clear yellow urine with negative nitrites, leukocyte Chest x-ray showed chronic bronchitic changes and no acute infiltrates. CT angio abdomen pelvis did not show acute vascular findings, which showed patent mesenteric vasculature without stenosis.  It also showed post partial gastrectomy and gastrojejunostomy. Fluid within the stomach with equivocal gastric wall thickening, can be seen with gastritis. Colonic diverticulosis without diverticulitis. Admitted to [facility]  Subjective: Patient was seen and examined this morning.   He did Foley catheter placement yesterday for urinary retention. Pending EGD this afternoon.  Assessment and plan: Intractable nausea, vomiting, poor oral intake History of severe esophagitis Prior history of partial gastrectomy and gastrojejunostomy had partial  gastrectomy and gastrojejunostomy 40 years ago for a bleeding ulcer. In an EGD in Feb 2023, patient was found to have severe esophagitis. At home, he was on Protonix 40 mg twice daily.  It seems that in the past he was also tried on Reglan 5 mg twice daily. Presented with nausea, vomiting, oral intake intolerance. CT scan showed gastritis related changes. Currently patient is on IV Protonix and IV every Reglan.  He is able to tolerate his diet better but continues to complain of fullness of the stomach and epigastric pain. Does not feel comfortable going home. Speech evaluation was obtained.  No risk of aspiration noted.  May need further GI assessment for esophageal dysphagia. GI consultation called.  Pending EGD this afternoon.  Acute urine retention Chronic bladder outlet obstruction due to BPH Right UVJ stone CT abdomen showed enlarged prostate causing mass effect on the bladder base with bladder wall trabeculation and diverticula.  It also showed a 4 mm stone at the right ureterovesicular junction with prominence of the right ureter. No frank hydronephrosis.  12/17, patient reported inability to pee.  Bladder scan showed 800 mL of urine retention.  Foley catheter was inserted.  Given the chronicity of his bladder outlet obstruction, I would keep the Foley in for another 1 to 2 weeks. Continue Flomax. Patient follows up with Dr. Diona Fanti as an outpatient.  He will need urology follow-up as an outpatient for voiding trial  Essential hypertension PTA on Lopressor 12.5 mg twice daily, losartan 25 mg daily as needed Continue both.  Continue to monitor blood pressure.   A-fib Continue metoprolol.  Eliquis is on hold.  To resume after GI clearance  CAD, HLD PTA on Eliquis 5 mg  twice daily, Lipitor 20 mg 3 times a week, Eliquis plan as above.  Continue Lipitor.  Goals of care   Code Status: Full Code    Mobility: Encourage ambulation  Scheduled Meds:  [MAR Hold] atorvastatin  20 mg  Oral Q M,W,F-2000   [MAR Hold] losartan  25 mg Oral Daily   [MAR Hold] melatonin  5 mg Oral QHS   [MAR Hold] metoCLOPramide (REGLAN) injection  5 mg Intravenous Q12H   [MAR Hold] metoprolol tartrate  12.5 mg Oral BID   [MAR Hold] pantoprazole (PROTONIX) IV  40 mg Intravenous Q12H   [MAR Hold] polyvinyl alcohol  1 drop Both Eyes BID   [MAR Hold] potassium chloride  40 mEq Oral BID   [MAR Hold] tamsulosin  0.4 mg Oral Daily    PRN meds: [MAR Hold] metoprolol tartrate   Infusions:   lactated ringers 50 mL/hr at 11/02/22 1330     Skin assessment:     Nutritional status:  Body mass index is 22.69 kg/m.          Diet:  Diet Order             Diet NPO time specified Except for: Sips with Meds  Diet effective midnight                   DVT prophylaxis: SCDs Place and maintain sequential compression device Start: 10/30/22 1333  Antimicrobials: None Fluid: None Consultants: None Family Communication: Son at bedside  Status is: Inpatient Continue in-hospital care because: Pending EGD this afternoon Level of care: Med-Surg   Dispo: The patient is from: Home              Anticipated d/c is to: Pending clinical course              Patient currently is not medically stable to d/c.   Difficult to place patient No    Antimicrobials: Anti-infectives (From admission, onward)    None       Objective: Vitals:   11/02/22 0550 11/02/22 1321  BP: 135/83 (!) 155/76  Pulse: 69 63  Resp: 18 16  Temp: 98.1 F (36.7 C) 98.1 F (36.7 C)  SpO2: 98% 98%    Intake/Output Summary (Last 24 hours) at 11/02/2022 1417 Last data filed at 11/02/2022 1012 Gross per 24 hour  Intake 440 ml  Output 2050 ml  Net -1610 ml    Filed Weights   10/29/22 2121  Weight: 78 kg   Weight change:  Body mass index is 22.69 kg/m.   Physical Exam: General exam: Pleasant, elderly Caucasian male. Lying down in bed. Skin: No rashes, lesions or ulcers. HEENT: Atraumatic,  normocephalic, no obvious bleeding Lungs: Clear to auscultation laterally CVS: Regular rate and rhythm, no murmur GI/Abd soft, tenderness present in epigastrium, mild distention present, bowel sound present CNS: Alert, awake, oriented x 3, hard of hearing. Psychiatry: Mood appropriate Extremities: No pedal edema, no calf tenderness  Data Review: I have personally reviewed the laboratory data and studies available.  F/u labs ordered Unresulted Labs (From admission, onward)    None       Total time spent in review of labs and imaging, patient evaluation, formulation of plan, documentation and communication with family - 13 minutes  Signed, Terrilee Croak, MD [facility] 11/02/2022

## 2022-11-02 NOTE — Interval H&P Note (Signed)
History and Physical Interval Note:  11/02/2022 2:12 PM  Joseph Simpson  has presented today for surgery, with the diagnosis of dysphagia, poor PO intake.  The various methods of treatment have been discussed with the patient and family. After consideration of risks, benefits and other options for treatment, the patient has consented to  Procedure(s): ESOPHAGOGASTRODUODENOSCOPY (EGD) WITH PROPOFOL (N/A) as a surgical intervention.  The patient's history has been reviewed, patient examined, no change in status, stable for surgery.  I have reviewed the patient's chart and labs.  Questions were answered to the patient's satisfaction.     Pricilla Riffle. Fuller Plan

## 2022-11-02 NOTE — Transfer of Care (Signed)
Immediate Anesthesia Transfer of Care Note  Patient: Joseph Simpson  Procedure(s) Performed: Procedure(s): ESOPHAGOGASTRODUODENOSCOPY (EGD) WITH PROPOFOL (N/A) BIOPSY  Patient Location: PACU and Endoscopy Unit  Anesthesia Type:MAC  Level of Consciousness: awake, alert  and oriented  Airway & Oxygen Therapy: Patient Spontanous Breathing and Patient connected to nasal cannula oxygen  Post-op Assessment: Report given to RN and Post -op Vital signs reviewed and stable  Post vital signs: Reviewed and stable  Last Vitals:  Vitals:   11/02/22 1321 11/02/22 1436  BP: (!) 155/76   Pulse: 63 66  Resp: 16 17  Temp: 36.7 C 36.7 C  SpO2: 16% 109%    Complications: No apparent anesthesia complications

## 2022-11-02 NOTE — Op Note (Signed)
[facility] Patient Name: Joseph Simpson Procedure Date: 11/02/2022 MRN: [id number] Attending MD: Ladene Artist , MD, [id number] Date of Birth: Jul 26, 1935 CSN: [id number] Age: 87 Admit Type: Inpatient Procedure:                Upper GI endoscopy Indications:              Persistent nausea, vomiting - unknown cause,                            Epigastric abdominal pain Providers:                Pricilla Riffle. Fuller Plan, MD, Mikey College, RN, Cherylynn Ridges, Technician, Eliberto Ivory Referring MD:             [facility] Medicines:                Monitored Anesthesia Care Complications:            No immediate complications. Estimated Blood Loss:     Estimated blood loss was minimal. Procedure:                Pre-Anesthesia Assessment:                           - Prior to the procedure, a History and Physical                            was performed, and patient medications and                            allergies were reviewed. The patient's tolerance of                            previous anesthesia was also reviewed. The risks                            and benefits of the procedure and the sedation                            options and risks were discussed with the patient.                            All questions were answered, and informed consent                            was obtained. Prior Anticoagulants: The patient has                            taken no anticoagulant or antiplatelet agents. ASA                            Grade Assessment: III - A patient with severe  systemic disease. After reviewing the risks and                            benefits, the patient was deemed in satisfactory                            condition to undergo the procedure.                           After obtaining informed consent, the endoscope was                            passed under direct vision. Throughout the                             procedure, the patient's blood pressure, pulse, and                            oxygen saturations were monitored continuously. The                            GIF-H190 ([id number]) Olympus endoscope was introduced                            through the mouth, and advanced to the afferent and                            efferent jejunal loops. The upper GI endoscopy was                            somewhat difficult due to poor endoscopic                            visualization. Scope In: Scope Out: Findings:      Localized, white plaques were found in the proximal esophagus. Biopsies       were taken with a cold forceps for histology.      LA Grade C (one or more mucosal breaks continuous between tops of 2 or       more mucosal folds, less than 75% circumference) esophagitis with no       bleeding was found in the distal esophagus. Biopsies were taken with a       cold forceps for histology.      The exam of the esophagus was otherwise normal.      Evidence of a patent Billroth II gastrojejunostomy was found. The       gastrojejunal anastomosis was characterized by erythema, congestion,       decreased opening size but did not appear to be obstructing. This was       traversed. The efferent limb was examined 15 cm from the anastomosis and       was characterized by healthy appearing mucosa. The afferent limb was       examined 15 cm from the anastomosis and was characterized by healthy       appearing mucosa.      Diffuse mild inflammation characterized by congestion (edema), erythema,  friability and granularity was found in the entire examined stomach.       Biopsies were taken with a cold forceps for histology.      A large amount of food (residue) was found in the gastric fundus and on       the greater curvature of the stomach. The liquid portion was suctioned       however the semi-solid / solid contents could not be suctioned and the       greater curvature was obscured.       A small hiatal hernia was present.      The exam of the stomach was otherwise normal. Impression:               - Esophageal plaques were found, suspicious for                            candidiasis. Biopsied.                           - LA Grade C reflux esophagitis with no bleeding.                            Biopsied.                           - Patent Billroth II gastrojejunostomy was found,                            characterized by erythema, congestion and decreased                            opening size but did not appear to be obstructing.                           - Gastritis. Biopsied.                           - A large amount of food (residue) in the stomach.                           - Small hiatal hernia. Moderate Sedation:      Not Applicable - Patient had care per Anesthesia. Recommendation:           - Return patient to hospital ward for ongoing care.                           - Clear liquid diet today and advance slowly as                            tolerated.                           - Continue present medications.                           - Await pathology results.                           -  Outpatient GI follow up with Dr. Havery Moros. Procedure Code(s):        --- Professional ---                           [phone], Esophagogastroduodenoscopy, flexible,                            transoral; with biopsy, single or multiple Diagnosis Code(s):        --- Professional ---                           K22.9, Disease of esophagus, unspecified                           K21.00, Gastro-esophageal reflux disease with                            esophagitis, without bleeding                           Z98.0, Intestinal bypass and anastomosis status                           K29.70, Gastritis, unspecified, without bleeding                           K44.9, Diaphragmatic hernia without obstruction or                            gangrene                           H85.27, Cyclical  vomiting syndrome unrelated to                            migraine                           R10.13, Epigastric pain CPT copyright 2022 [employer]. All rights reserved. The codes documented in this report are preliminary and upon coder review may  be revised to meet current compliance requirements. Ladene Artist, MD 11/02/2022 2:46:55 PM This report has been signed electronically. Number of Addenda: 0

## 2022-11-03 ENCOUNTER — Telehealth: Payer: Self-pay

## 2022-11-03 DIAGNOSIS — K2100 Gastro-esophageal reflux disease with esophagitis, without bleeding: Secondary | ICD-10-CM | POA: Diagnosis not present

## 2022-11-03 DIAGNOSIS — R112 Nausea with vomiting, unspecified: Secondary | ICD-10-CM | POA: Diagnosis not present

## 2022-11-03 MED ORDER — PANTOPRAZOLE SODIUM 40 MG PO TBEC: 40.0000 mg | DELAYED_RELEASE_TABLET | Freq: Two times a day (BID) | ORAL | 2 refills | Status: DC

## 2022-11-03 MED ORDER — METOCLOPRAMIDE HCL 5 MG PO TABS: 5.0000 mg | ORAL_TABLET | Freq: Three times a day (TID) | ORAL | 0 refills | Status: DC

## 2022-11-03 NOTE — Progress Notes (Addendum)
    Progress Note   Subjective  Chief Complaint: Early satiety, nausea and vomiting  EGD 11/02/2022 with esophageal plaques suspicious for candidiasis, LA grade C reflux esophagitis, patent Billroth II gastrojejunostomy with erythema, congestion and decreased opening size which did not appear to be obstructing, gastritis and a large amount of food as well as a small hiatal hernia.  This morning, patient tells me that he feels well, only mild discomfort in his epigastrium, he tolerated his clear liquid diet last night and wanted like to try some more food.  He was told that he would likely be going home today and he is okay with that.  No new complaints or concerns.   Objective   Vital signs in last 24 hours: Temp:  [97.4 F (36.3 C)-98.1 F (36.7 C)] 97.7 F (36.5 C) (12/19 0549) Pulse Rate:  [63-81] 75 (12/19 0549) Resp:  [14-20] 18 (12/19 0549) BP: (105-155)/(48-81) 130/70 (12/19 0549) SpO2:  [98 %-100 %] 98 % (12/19 0549) Last BM Date : 10/31/22 General:    Elderly, white male in NAD Heart:  Regular rate and rhythm; no murmurs Lungs: Respirations even and unlabored, lungs CTA bilaterally Abdomen:  Soft, mild epigastric ttp and nondistended. Normal bowel sounds. Psych:  Cooperative. Normal mood and affect.  Intake/Output from previous day: 12/18 0701 - 12/19 0700 In: 263 [P.O.:221; I.V.:500] Out: 1900 [Urine:1900] Intake/Output this shift: Total I/O In: -  Out: 200 [Urine:200]   Assessment / Plan:   Assessment: 1.  Intractable nausea and vomiting with poor oral intake: CT with possible gastritis, improvement with IV Protonix and IV Reglan, EGD yesterday with some improvement in esophagitis LA grade C, element of decreased opening size status post Billroth II not thought to be obstructing but could be contributing to symptoms 2.  History of partial gastrectomy and gastrojejunostomy 3.  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  A-fib: Eliquis currently on hold 5.  CAD 6.  Hypokalemia  Plan: 1.  Reviewed recent EGD findings with the patient and his son and answered questions. 2.  Patient will be continued on Pantoprazole 40 mg IV twice daily 3.  Discussed the possibility of esophageal candidiasis, pending biopsies we may start him on treatment for this as well.  He will be notified as an outpatient. 4.  Will set the patient up for follow-up visit with [place] in the next 4 to 6 weeks 5.  We will sign off  Thank you for your kind consultation.   LOS: 4 days   Levin Erp  11/03/2022, 9:17 AM    Attending Physician Note   I have taken an interval history, reviewed the chart and examined the patient. I performed more than 50% of this encounter in conjunction with the APP. I agree with the APP's note, impression and recommendations with my edits. My additional impressions and recommendations are as follows.   LA Grade C esophagitis, gastritis and possible candidiasis. Path pending.   Continue pantoprazole 40 mg bid long term Follow antireflux measures Continue metoclopramide 5 mg tid ac for 2 weeks then stop Would not advance beyond soft foods for now GI follow up with Dr. Havery Moros or APP in 4-6 weeks GI signing off, available if needed  Lucio Edward, MD [facility] See Shea Evans, [facility], for our on call provider

## 2022-11-03 NOTE — Telephone Encounter (Signed)
-----   Message from Levin Erp, [place] sent at 11/03/2022  9:34 AM EST ----- Regarding: follow up please Joseph Simpson can you please schedule this patient with Dr. Havery Moros myself in the next 4 to 6 weeks for hospital follow-up.  Thanks, JL L

## 2022-11-03 NOTE — Discharge Summary (Signed)
Physician Discharge Summary  Joseph Simpson MVE:[id number] DOB: [birth year] DOA: 10/29/2022  PCP: Marin Olp, MD  Admit date: 10/29/2022 Discharge date: 11/03/2022  Admitted From: Home Discharge disposition: Home  Recommendations at discharge:  Continue Protonix 40 mg twice daily long-term. Follow antireflux measures. Continue Reglan 5 mg 3 times daily AC for 2 weeks then stop Continue soft food only for now Follow-up with Dr. Havery Moros in 4 to 6 weeks  Brief narrative: Joseph Simpson is a 87 y.o. male with PMH significant for HTN, HLD, A-fib, CAD, who had partial gastrectomy and gastrojejunostomy 40 years ago for a bleeding ulcer, severe esophagitis on EGD 12/2021, nephrolithiasis, OSA, B12 deficiency, polyneuropathy.. 12/12, patient was brought to the ED from home with generalized weakness for a week and nausea and vomiting for 1 day.  CT abdomen pelvis unremarkable.  Labs within normal limit.  Respiratory virus panel negative.  He was given symptomatic management with Compazine, Benadryl and was discharged to home.   At home, his symptoms recurred.  He started having nausea and vomiting again.  He was unable to hold any food down, started having severe epigastric pain 9/10 radiating to the back and hence returned back to ED again on 12/14. In the ED, he was afebrile, heart rate in 70s, blood pressure 180/81, breathing on room air Labs with WBC count 11.6, glucose 181, renal function normal, troponin normal UTI with clear yellow urine with negative nitrites, leukocyte Chest x-ray showed chronic bronchitic changes and no acute infiltrates. CT angio abdomen pelvis did not show acute vascular findings, which showed patent mesenteric vasculature without stenosis.  It also showed post partial gastrectomy and gastrojejunostomy. Fluid within the stomach with equivocal gastric wall thickening, can be seen with gastritis. Colonic diverticulosis without diverticulitis. Admitted to  [facility] 12/17, noted to have urine retention and hence Foley catheter was placed. 12/18, underwent EGD.  Findings as below  Subjective: Patient was seen and examined this morning.   Underwent EGD yesterday. This morning, patient feels okay, only mild discomfort in the epigastrium, tolerated soft diet this morning. Feels comfortable going home today  Assessment and plan: Severe esophagitis  Patient presented with intractable nausea, vomiting, poor oral intake He had had partial gastrectomy and gastrojejunostomy 40 years ago for a bleeding ulcer. He had an EGD in Feb 2023, patient was found to have severe esophagitis. At home, he was on Protonix 40 mg twice daily.  It seems that in the past he was also tried on Reglan 5 mg twice daily. Presented with nausea, vomiting, oral intake intolerance. CT scan showed gastritis related changes. GI consultation was obtained.   EGD 12/18 showed esophageal plaques suspicious for candidiasis, LA grade C reflux esophagitis, patent Billroth II gastrojejunostomy with erythema, congestion and decreased opening size without obstruction. Biopsy report pending. Able to tolerate soft diet. GI follow-up appreciated. Recommendations Continue Protonix 40 mg twice daily long-term. Follow antireflux measures. Continue Reglan 5 mg 3 times daily AC for 2 weeks then stop Continue soft food only for now Follow-up with Dr. Havery Moros in 4 to 6 weeks  Acute urine retention Chronic bladder outlet obstruction due to BPH Right UVJ stone CT abdomen showed enlarged prostate causing mass effect on the bladder base with bladder wall trabeculation and diverticula.  It also showed a 4 mm stone at the right ureterovesicular junction with prominence of the right ureter. No frank hydronephrosis.  12/17, patient reported inability to pee.  Bladder scan showed 800 mL of urine retention.  Foley  catheter was inserted.  Given the chronicity of his bladder outlet obstruction, I would keep  the Foley in for another 1 to 2 weeks. Continue Flomax. Patient follows up with Dr. Diona Fanti as an outpatient.  He will need urology follow-up as an outpatient for voiding trial  Essential hypertension PTA on Lopressor 12.5 mg twice daily, losartan 25 mg daily as needed Continue both.  Continue to monitor blood pressure.   A-fib Continue metoprolol.  Eliquis is on hold.  To resume after GI clearance  CAD, HLD PTA on Eliquis 5 mg twice daily, Lipitor 20 mg 3 times a week, Eliquis plan as above.  Continue Lipitor.  Wounds:  - Incision (Closed) 12/01/21 Penis (Active)  Date First Assessed/Time First Assessed: 12/01/21 1026   Location: Penis    Assessments 12/01/2021 10:28 AM 12/01/2021 11:00 AM  Dressing Type Gauze (Comment) Gauze (Comment)  Dressing New drainage;Intact Changed;Clean;Dry;Intact  Drainage Amount Minimal --  Drainage Description Sanguineous --     No associated orders.    Discharge Exam:   Vitals:   11/02/22 1543 11/02/22 2032 11/03/22 0549 11/03/22 1220  BP: 138/71 133/81 130/70 110/78  Pulse: 66 81 75 68  Resp: _0 Temp: (!) 97.4 F (36.3 C) 97.6 F (36.4 C) 97.7 F (36.5 C) 97.6 F (36.4 C)  TempSrc: Oral Oral Oral Oral  SpO2: 100% 99% 98% 100%  Weight:      Height:        Body mass index is 22.69 kg/m.  General exam: Pleasant, elderly Caucasian male. Lying down in bed. Skin: No rashes, lesions or ulcers. HEENT: Atraumatic, normocephalic, no obvious bleeding Lungs: Clear to auscultation laterally CVS: Regular rate and rhythm, no murmur GI/Abd soft, tenderness present in epigastrium, mild distention present, bowel sound present CNS: Alert, awake, oriented x 3, hard of hearing. Psychiatry: Mood appropriate Extremities: No pedal edema, no calf tenderness  Follow ups:    Follow-up Information     Marin Olp, MD Follow up.   Specialty: Family Medicine Contact information: [place]  [phone]                 Discharge Instructions:   Discharge Instructions     Call MD for:  difficulty breathing, headache or visual disturbances   Complete by: As directed    Call MD for:  extreme fatigue   Complete by: As directed    Call MD for:  hives   Complete by: As directed    Call MD for:  persistant dizziness or light-headedness   Complete by: As directed    Call MD for:  persistant nausea and vomiting   Complete by: As directed    Call MD for:  severe uncontrolled pain   Complete by: As directed    Call MD for:  temperature >100.4   Complete by: As directed    Diet general   Complete by: As directed    Soft diet   Discharge instructions   Complete by: As directed    Recommendations at discharge:   Continue Protonix 40 mg twice daily long-term.  Follow antireflux measures.  Continue Reglan 5 mg 3 times daily AC for 2 weeks then stop  Continue soft food only for now  Follow-up with Dr. Havery Moros in 4 to 6 weeks  General discharge instructions: Follow with Primary MD Marin Olp, MD in 7 days  Please request your PCP  to go over your hospital tests, procedures,  radiology results at the follow up. Please get your medicines reviewed and adjusted.  Your PCP may decide to repeat certain labs or tests as needed. Do not drive, operate heavy machinery, perform activities at heights, swimming or participation in water activities or provide baby sitting services if your were admitted for syncope or siezures until you have seen by Primary MD or a Neurologist and advised to do so again. [place] Controlled Substance Reporting System database was reviewed. Do not drive, operate heavy machinery, perform activities at heights, swim, participate in water activities or provide baby-sitting services while on medications for pain, sleep and mood until your outpatient physician has reevaluated you and advised to do so again.  You are strongly  recommended to comply with the dose, frequency and duration of prescribed medications. Activity: As tolerated with Full fall precautions use walker/cane & assistance as needed Avoid using any recreational substances like cigarette, tobacco, alcohol, or non-prescribed drug. If you experience worsening of your admission symptoms, develop shortness of breath, life threatening emergency, suicidal or homicidal thoughts you must seek medical attention immediately by calling 911 or calling your MD immediately  if symptoms less severe. You must read complete instructions/literature along with all the possible adverse reactions/side effects for all the medicines you take and that have been prescribed to you. Take any new medicine only after you have completely understood and accepted all the possible adverse reactions/side effects.  Wear Seat belts while driving. You were cared for by a hospitalist during your hospital stay. If you have any questions about your discharge medications or the care you received while you were in the hospital after you are discharged, you can call the unit and ask to speak with the hospitalist or the covering physician. Once you are discharged, your primary care physician will handle any further medical issues. Please note that NO REFILLS for any discharge medications will be authorized once you are discharged, as it is imperative that you return to your primary care physician (or establish a relationship with a primary care physician if you do not have one).   Increase activity slowly   Complete by: As directed        Discharge Medications:   Allergies as of 11/03/2022       Reactions   Tape Other (See Comments)   SKIN IS FRAGILE AND WILL TEAR EASILY- PAPER TAPE ONLY   Aspirin Other (See Comments)   "UPSETS THE STOMACH"   Percocet [oxycodone-acetaminophen] Nausea And Vomiting   Vitamins [apatate] Other (See Comments)   "Bothers the stomach"        Medication List      TAKE these medications    acetaminophen 500 MG tablet Commonly known as: TYLENOL Take 500-1,000 mg by mouth 2 (two) times daily as needed for mild pain, moderate pain or headache.   apixaban 5 MG Tabs tablet Commonly known as: ELIQUIS Take 1 tablet (5 mg total) by mouth 2 (two) times daily.   atorvastatin 20 MG tablet Commonly known as: LIPITOR TAKE ONE TABLET BY MOUTH THREE TIMES A WEEK What changed:  how much to take how to take this when to take this   Benadryl Allergy 25 MG tablet Generic drug: diphenhydrAMINE Take 12.5 mg by mouth See admin instructions. Take 12.5 mg by mouth every 8 hours when taking Prochlorperazine   bevacizumab 2.5 mg/0.1 mL Soln Commonly known as: AVASTIN 2.5 mg by Intravitreal route See admin instructions. Injection to eyes every 4 weeks - use with vigamox  Carboxymethylcellulose Sodium 1 % Gel Place 1 drop into both eyes at bedtime.   Cyanocobalamin 1000 MCG/ML Kit Inject 1,000 mcg as directed every 30 (thirty) days.   docusate sodium 100 MG capsule Commonly known as: COLACE Take 200 mg by mouth at bedtime.   losartan 25 MG tablet Commonly known as: COZAAR Take 0.5-1 tablets (12.5-25 mg total) by mouth daily. If blood pressure over 145 when you check- ok to take other half tablet once per day (so total of 20m per day) What changed:  how much to take when to take this additional instructions   metoCLOPramide 5 MG tablet Commonly known as: REGLAN Take 1 tablet (5 mg total) by mouth 3 (three) times daily before meals for 14 days. What changed: when to take this   metoprolol tartrate 25 MG tablet Commonly known as: LOPRESSOR Take 12.5 mg by mouth 2 (two) times daily.   pantoprazole 40 MG tablet Commonly known as: PROTONIX Take 1 tablet (40 mg total) by mouth 2 (two) times daily.   polyethylene glycol 17 g packet Commonly known as: MIRALAX / GLYCOLAX Take 17 g by mouth daily as needed for moderate constipation (mix and drink as  directed).   prochlorperazine 10 MG tablet Commonly known as: COMPAZINE Take 1 tablet (10 mg total) by mouth every 8 (eight) hours as needed for nausea or vomiting (take with 12.565mbenadryl please).   SYSTANE BALANCE OP Place 1 drop into both eyes in the morning and at bedtime.   tamsulosin 0.4 MG Caps capsule Commonly known as: FLOMAX Take 0.4 mg by mouth daily.   Vigamox 0.5 % ophthalmic solution Generic drug: moxifloxacin Place 1 drop into both eyes See admin instructions. Instill every 4 weeks as directed - use with avastin, instill 1 drop into both eyes 4 times daily for 2 days after eye injections         The results of significant diagnostics from this hospitalization (including imaging, microbiology, ancillary and laboratory) are listed below for reference.    Procedures and Diagnostic Studies:   CT Angio Abd/Pel W and/or Wo Contrast  Result Date: 10/29/2022 CLINICAL DATA:  Abdominal pain. Vomiting. Recent diagnosis of flu. EXAM: CTA ABDOMEN AND PELVIS WITHOUT AND WITH CONTRAST TECHNIQUE: Multidetector CT imaging of the abdomen and pelvis was performed using the standard protocol during bolus administration of intravenous contrast. Multiplanar reconstructed images and MIPs were obtained and reviewed to evaluate the vascular anatomy. RADIATION DOSE REDUCTION: This exam was performed according to the departmental dose-optimization program which includes automated exposure control, adjustment of the mA and/or kV according to patient size and/or use of iterative reconstruction technique. CONTRAST:  10050mMNIPAQUE IOHEXOL 350 MG/ML SOLN COMPARISON:  CT 2 days ago 10/27/2022 FINDINGS: VASCULAR Aorta: Normal caliber aorta without aneurysm, dissection, vasculitis or significant stenosis. Mild to moderate calcified plaque. Celiac: Patent without evidence of aneurysm, dissection, vasculitis or significant stenosis. SMA: Patent without evidence of aneurysm, dissection, vasculitis or  significant stenosis. Renals: Both renal arteries are patent without evidence of aneurysm, dissection, vasculitis, fibromuscular dysplasia or significant stenosis. Early branching of renal artery on the right. IMA: Diminutive but patent. Inflow: Patent without evidence of aneurysm, dissection, vasculitis or significant stenosis. Proximal Outflow: Bilateral common femoral and visualized portions of the superficial and profunda femoral arteries are patent without evidence of aneurysm, dissection, vasculitis or significant stenosis. Veins: Venous phase imaging demonstrates patency of the portal, splenic, and mesenteric veins. No filling defects of the iliac veins or IVC. No acute venous findings.  Review of the MIP images confirms the above findings. NON-VASCULAR Lower chest: No basilar airspace disease or pleural effusion. Surgical clips adjacent to the distal esophagus. Hepatobiliary: Borderline hepatic steatosis. No focal liver abnormality. Gallbladder physiologically distended, no calcified stone. No biliary dilatation. Pancreas: No ductal dilatation or inflammation. Spleen: Normal in size without focal abnormality. Adrenals/Urinary Tract: No adrenal nodule. Suspected 4 mm stone at the right ureterovesicular junction with prominence of the right ureter. There is no frank hydronephrosis. Areas of cortical scarring in the right kidney. Nonobstructing stone in the lower pole of the right kidney. Punctate nonobstructing stone in the lower pole the left kidney. No left hydronephrosis. Bladder wall appears trabeculated with diverticula. Stomach/Bowel: Postsurgical change of the gastroesophageal junction. Post partial gastrectomy and gastrojejunostomy. Fluid within the stomach with equivocal gastric wall thickening. No obstruction. No small bowel inflammation. The appendix is not definitively seen. Multifocal colonic diverticulosis. No diverticulitis. Moderate colonic stool burden. Sigmoid colon is redundant coursing into  the right pelvis. Decreased rectal distension from CT. Lymphatic: No abdominopelvic adenopathy. Reproductive: Enlarged prostate causing mass effect on the bladder base. Other: No ascites. No free intra-abdominal air. Postsurgical change of the upper abdominal wall. Musculoskeletal: Left hip arthroplasty. No acute osseous findings. IMPRESSION: VASCULAR: 1. Patent mesenteric vasculature without stenosis. No acute vascular findings. 2.  Aortic Atherosclerosis (ICD10-I70.0). NON-VASCULAR: 1. Suspected 4 mm stone at the right ureterovesicular junction with prominence of the right ureter. No frank hydronephrosis. Additional bilateral nonobstructing renal calculi. 2. Post partial gastrectomy and gastrojejunostomy. Fluid within the stomach with equivocal gastric wall thickening, can be seen with gastritis. 3. Colonic diverticulosis without diverticulitis. 4. Enlarged prostate causing mass effect on the bladder base. Bladder wall appears trabeculated with diverticula, consistent with chronic bladder outlet obstruction. Electronically Signed   By: Keith Rake M.D.   On: 10/29/2022 18:07   DG Chest Port 1 View  Result Date: 10/29/2022 CLINICAL DATA:  Flu like symptoms EXAM: PORTABLE CHEST 1 VIEW COMPARISON:  Radiograph 01/03/2022 FINDINGS: Normal cardiac silhouette. Chronic bronchitic markings. No infiltrate or effusion. No pneumothorax. Prominent central rib costochondral junctions noted. RIGHT shoulder arthroplasty IMPRESSION: Chronic bronchitic markings.  No acute findings. Electronically Signed   By: Suzy Bouchard M.D.   On: 10/29/2022 14:45     Labs:   Basic Metabolic Panel: Recent Labs  Lab 10/29/22 0941 10/29/22 1703 10/30/22 0547  NA 137  --  141  K 3.5  --  3.2*  CL 97*  --  101  CO2 26  --  27  GLUCOSE 181*  --  151*  BUN 17  --  18  CREATININE 0.72 0.68 0.62  CALCIUM 10.1  --  9.5   GFR Estimated Creatinine Clearance: 74.5 mL/min (by C-G formula based on SCr of 0.62 mg/dL). Liver  Function Tests: Recent Labs  Lab 10/29/22 0941 10/30/22 0547  AST 21 19  ALT 19 15  ALKPHOS 61 57  BILITOT 1.7* 1.5*  PROT 8.2* 7.9  ALBUMIN 4.4 4.5   Recent Labs  Lab 10/29/22 0941  LIPASE 25   No results for input(s): "AMMONIA" in the last 168 hours. Coagulation profile No results for input(s): "INR", "PROTIME" in the last 168 hours.  CBC: Recent Labs  Lab 10/29/22 0941 10/29/22 2118 10/30/22 0547  WBC 11.7* 13.3* 10.0  HGB 16.7 16.2 16.4  HCT 49.5 46.8 47.7  MCV 93.4 93.8 94.5  PLT 191 191 175   Cardiac Enzymes: No results for input(s): "CKTOTAL", "CKMB", "CKMBINDEX", "TROPONINI" in the last 168  hours. BNP: Invalid input(s): "POCBNP" CBG: No results for input(s): "GLUCAP" in the last 168 hours. D-Dimer No results for input(s): "DDIMER" in the last 72 hours. Hgb A1c No results for input(s): "HGBA1C" in the last 72 hours. Lipid Profile No results for input(s): "CHOL", "HDL", "LDLCALC", "TRIG", "CHOLHDL", "LDLDIRECT" in the last 72 hours. Thyroid function studies No results for input(s): "TSH", "T4TOTAL", "T3FREE", "THYROIDAB" in the last 72 hours.  Invalid input(s): "FREET3" Anemia work up No results for input(s): "VITAMINB12", "FOLATE", "FERRITIN", "TIBC", "IRON", "RETICCTPCT" in the last 72 hours. Microbiology Recent Results (from the past 240 hour(s))  Resp panel by RT-PCR (RSV, Flu A&B, Covid) Anterior Nasal Swab     Status: None   Collection Time: 10/27/22  2:36 PM   Specimen: Anterior Nasal Swab  Result Value Ref Range Status   SARS Coronavirus 2 by RT PCR NEGATIVE NEGATIVE Final    Comment: (NOTE) SARS-CoV-2 target nucleic acids are NOT DETECTED.  The SARS-CoV-2 RNA is generally detectable in upper respiratory specimens during the acute phase of infection. The lowest concentration of SARS-CoV-2 viral copies this assay can detect is 138 copies/mL. A negative result does not preclude SARS-Cov-2 infection and should not be used as the sole basis  for treatment or other patient management decisions. A negative result may occur with  improper specimen collection/handling, submission of specimen other than nasopharyngeal swab, presence of viral mutation(s) within the areas targeted by this assay, and inadequate number of viral copies(<138 copies/mL). A negative result must be combined with clinical observations, patient history, and epidemiological information. The expected result is Negative.  Fact Sheet for Patients:  EntrepreneurPulse.com.au  Fact Sheet for Healthcare Providers:  IncredibleEmployment.be  This test is no t yet approved or cleared by the [place] FDA and  has been authorized for detection and/or diagnosis of SARS-CoV-2 by FDA under an Emergency Use Authorization (EUA). This EUA will remain  in effect (meaning this test can be used) for the duration of the COVID-19 declaration under Section 564(b)(1) of the Act, 21 U.S.C.section 360bbb-3(b)(1), unless the authorization is terminated  or revoked sooner.       Influenza A by PCR NEGATIVE NEGATIVE Final   Influenza B by PCR NEGATIVE NEGATIVE Final    Comment: (NOTE) The Xpert Xpress SARS-CoV-2/FLU/RSV plus assay is intended as an aid in the diagnosis of influenza from Nasopharyngeal swab specimens and should not be used as a sole basis for treatment. Nasal washings and aspirates are unacceptable for Xpert Xpress SARS-CoV-2/FLU/RSV testing.  Fact Sheet for Patients: EntrepreneurPulse.com.au  Fact Sheet for Healthcare Providers: IncredibleEmployment.be  This test is not yet approved or cleared by the [place] FDA and has been authorized for detection and/or diagnosis of SARS-CoV-2 by FDA under an Emergency Use Authorization (EUA). This EUA will remain in effect (meaning this test can be used) for the duration of the COVID-19 declaration under Section 564(b)(1) of the Act, 21  U.S.C. section 360bbb-3(b)(1), unless the authorization is terminated or revoked.     Resp Syncytial Virus by PCR NEGATIVE NEGATIVE Final    Comment: (NOTE) Fact Sheet for Patients: EntrepreneurPulse.com.au  Fact Sheet for Healthcare Providers: IncredibleEmployment.be  This test is not yet approved or cleared by the [place] FDA and has been authorized for detection and/or diagnosis of SARS-CoV-2 by FDA under an Emergency Use Authorization (EUA). This EUA will remain in effect (meaning this test can be used) for the duration of the COVID-19 declaration under Section 564(b)(1) of the Act, 21 U.S.C. section  360bbb-3(b)(1), unless the authorization is terminated or revoked.  Performed at [facility], [place] [address], [place], [place] [address]     Time coordinating discharge: 35 minutes  Signed: Marlowe Aschoff Ladiamond Gallina  [facility] 11/03/2022, 1:11 PM

## 2022-11-03 NOTE — Telephone Encounter (Signed)
Patient has been scheduled for a 5-week hospital follow up appointment with Ellouise Newer, PA-C on Tuesday, 12/08/22 at 10:30 am.   Appt information mailed and sent via [employer].

## 2022-11-03 NOTE — Progress Notes (Signed)
Discharge instructions discussed with patient and family, including care of foley catheter, verbalized agreement and understanding

## 2022-11-03 NOTE — Progress Notes (Signed)
Mobility Specialist - Progress Note   Pre-mobility: 75 bpm HR, 148/69 (93) mmHg BP Post-mobility: 60 bpm HR, 146/77 (96) mmHg BP   11/03/22 0900  Mobility  Activity Ambulated with assistance in room  Level of Assistance Standby assist, set-up cues, supervision of patient - no hands on  Assistive Device Four wheel walker  Distance Ambulated (ft) 20 ft  Range of Motion/Exercises Active  Activity Response Tolerated well  Mobility Referral Yes  $Mobility charge 1 Mobility   Pt was found in bed and agreeable to ambulate. Stated feeling dizzy throughout session and was a bit unsteady at times during ambulation. At EOS returned to bed with necessities in reach.  Ferd Hibbs Mobility Specialist

## 2022-11-04 ENCOUNTER — Telehealth: Payer: Self-pay

## 2022-11-04 NOTE — Patient Outreach (Signed)
  Care Coordination TOC Note Transition Care Management Unsuccessful Follow-up Telephone Call  Date of discharge and from where:  11/03/22-[facility]    Attempts:  1st Attempt  Reason for unsuccessful TCM follow-up call:  Unable to leave message     Enzo Montgomery, RN,BSN,CCM [facility] Telephonic Care Management Coordinator Direct Phone: [phone] Toll Free: [phone] Fax: [phone]

## 2022-11-04 NOTE — Patient Outreach (Signed)
  Care Coordination TOC Note Transition Care Management Follow-up Telephone Call Date of discharge and from where: 11/03/22-[facility]  Dx: "N&V" How have you been since you were released from the hospital? Incoming call from caregiver-Janice returning RN CM call. Patient on speaker phone and could be heard in background. He is doing fairly well. He has complained of some dizziness this morning.They have not checked his BP yet but encouraged to do. Discussed possible causes, x measures and safety measures. Caregiver voiced understanding. Patient has catheter in place and no issues. She will call and make follow up appt with urology. He ate a good dinner last night and no GI sxs reported. LBM was a few days ago. Reviewed bowel regimen and encouraged to give Miralax along with scheduled dose of Colace. Any questions or concerns? No  Items Reviewed: Did the pt receive and understand the discharge instructions provided? Yes  Medications obtained and verified? Yes  Other? Yes  Any new allergies since your discharge? No  Dietary orders reviewed? Yes Do you have support at home? Yes   Home Care and Equipment/Supplies: Were home health services ordered? not applicable If so, what is the name of the agency? N/A  Has the agency set up a time to come to the patient's home? not applicable Were any new equipment or medical supplies ordered?  No What is the name of the medical supply agency? N/A Were you able to get the supplies/equipment? not applicable Do you have any questions related to the use of the equipment or supplies? No  Functional Questionnaire: (I = Independent and D = Dependent) ADLs: A  Bathing/Dressing- A  Meal Prep- A  Eating- I  Maintaining continence- A  Transferring/Ambulation- A  Managing Meds- A  Follow up appointments reviewed:  PCP Hospital f/u appt confirmed? Yes  Scheduled to see Dr. Yong Channel on 11/19/22 @ 4 pm. [facility] f/u appt confirmed? Yes   Scheduled to see Madaline Savage on 12/08/22 @ 10:30 am. Are transportation arrangements needed? No  If their condition worsens, is the pt aware to call PCP or go to the Emergency Dept.? Yes Was the patient provided with contact information for the PCP's office or ED? Yes Was to pt encouraged to call back with questions or concerns? Yes  SDOH assessments and interventions completed:   Yes SDOH Interventions Today    Flowsheet Row Most Recent Value  SDOH Interventions   Food Insecurity Interventions Intervention Not Indicated  Transportation Interventions Intervention Not Indicated       Care Coordination Interventions:  Education provided    Encounter Outcome:  Pt. Visit Completed    Enzo Montgomery, RN,BSN,CCM [place] Management Telephonic Care Management Coordinator Direct Phone: [phone] Toll Free: [phone] Fax: [phone]

## 2022-11-05 ENCOUNTER — Encounter (HOSPITAL_COMMUNITY): Payer: Self-pay

## 2022-11-05 ENCOUNTER — Emergency Department (HOSPITAL_COMMUNITY): Payer: Medicare Other

## 2022-11-05 ENCOUNTER — Other Ambulatory Visit: Payer: Self-pay

## 2022-11-05 ENCOUNTER — Inpatient Hospital Stay (HOSPITAL_COMMUNITY)
Admission: EM | Admit: 2022-11-05 | Discharge: 2022-11-11 | DRG: 641 | Disposition: A | Discharge status: Skilled Nursing Facility | Payer: Medicare Other | Attending: Internal Medicine | Admitting: Internal Medicine

## 2022-11-05 DIAGNOSIS — I10 Essential (primary) hypertension: Secondary | ICD-10-CM | POA: Diagnosis present

## 2022-11-05 DIAGNOSIS — Z85820 Personal history of malignant melanoma of skin: Secondary | ICD-10-CM

## 2022-11-05 DIAGNOSIS — R338 Other retention of urine: Secondary | ICD-10-CM | POA: Diagnosis present

## 2022-11-05 DIAGNOSIS — R000 Tachycardia, unspecified: Secondary | ICD-10-CM | POA: Diagnosis not present

## 2022-11-05 DIAGNOSIS — M1380 Other specified arthritis, unspecified site: Secondary | ICD-10-CM | POA: Diagnosis not present

## 2022-11-05 DIAGNOSIS — E785 Hyperlipidemia, unspecified: Secondary | ICD-10-CM | POA: Diagnosis present

## 2022-11-05 DIAGNOSIS — Z7901 Long term (current) use of anticoagulants: Secondary | ICD-10-CM

## 2022-11-05 DIAGNOSIS — Z87891 Personal history of nicotine dependence: Secondary | ICD-10-CM | POA: Diagnosis not present

## 2022-11-05 DIAGNOSIS — M6259 Muscle wasting and atrophy, not elsewhere classified, multiple sites: Secondary | ICD-10-CM | POA: Diagnosis not present

## 2022-11-05 DIAGNOSIS — Z96642 Presence of left artificial hip joint: Secondary | ICD-10-CM | POA: Diagnosis present

## 2022-11-05 DIAGNOSIS — K3184 Gastroparesis: Secondary | ICD-10-CM | POA: Diagnosis present

## 2022-11-05 DIAGNOSIS — G9341 Metabolic encephalopathy: Secondary | ICD-10-CM

## 2022-11-05 DIAGNOSIS — K2100 Gastro-esophageal reflux disease with esophagitis, without bleeding: Secondary | ICD-10-CM | POA: Diagnosis present

## 2022-11-05 DIAGNOSIS — G4733 Obstructive sleep apnea (adult) (pediatric): Secondary | ICD-10-CM | POA: Diagnosis present

## 2022-11-05 DIAGNOSIS — K219 Gastro-esophageal reflux disease without esophagitis: Secondary | ICD-10-CM | POA: Diagnosis not present

## 2022-11-05 DIAGNOSIS — Z885 Allergy status to narcotic agent status: Secondary | ICD-10-CM

## 2022-11-05 DIAGNOSIS — H3530 Unspecified macular degeneration: Secondary | ICD-10-CM | POA: Diagnosis not present

## 2022-11-05 DIAGNOSIS — Z961 Presence of intraocular lens: Secondary | ICD-10-CM | POA: Diagnosis present

## 2022-11-05 DIAGNOSIS — I700 Atherosclerosis of aorta: Secondary | ICD-10-CM | POA: Diagnosis not present

## 2022-11-05 DIAGNOSIS — R531 Weakness: Secondary | ICD-10-CM | POA: Diagnosis not present

## 2022-11-05 DIAGNOSIS — R110 Nausea: Secondary | ICD-10-CM | POA: Diagnosis not present

## 2022-11-05 DIAGNOSIS — Z7401 Bed confinement status: Secondary | ICD-10-CM | POA: Diagnosis not present

## 2022-11-05 DIAGNOSIS — Z91048 Other nonmedicinal substance allergy status: Secondary | ICD-10-CM | POA: Diagnosis not present

## 2022-11-05 DIAGNOSIS — N139 Obstructive and reflux uropathy, unspecified: Secondary | ICD-10-CM | POA: Diagnosis not present

## 2022-11-05 DIAGNOSIS — N401 Enlarged prostate with lower urinary tract symptoms: Secondary | ICD-10-CM | POA: Diagnosis present

## 2022-11-05 DIAGNOSIS — I482 Chronic atrial fibrillation, unspecified: Secondary | ICD-10-CM | POA: Diagnosis present

## 2022-11-05 DIAGNOSIS — R112 Nausea with vomiting, unspecified: Secondary | ICD-10-CM | POA: Diagnosis not present

## 2022-11-05 DIAGNOSIS — E512 Wernicke's encephalopathy: Principal | ICD-10-CM | POA: Diagnosis present

## 2022-11-05 DIAGNOSIS — G629 Polyneuropathy, unspecified: Secondary | ICD-10-CM | POA: Diagnosis present

## 2022-11-05 DIAGNOSIS — N390 Urinary tract infection, site not specified: Secondary | ICD-10-CM | POA: Diagnosis present

## 2022-11-05 DIAGNOSIS — I480 Paroxysmal atrial fibrillation: Secondary | ICD-10-CM | POA: Diagnosis present

## 2022-11-05 DIAGNOSIS — R2681 Unsteadiness on feet: Secondary | ICD-10-CM | POA: Diagnosis not present

## 2022-11-05 DIAGNOSIS — R630 Anorexia: Secondary | ICD-10-CM | POA: Diagnosis present

## 2022-11-05 DIAGNOSIS — B952 Enterococcus as the cause of diseases classified elsewhere: Secondary | ICD-10-CM | POA: Diagnosis present

## 2022-11-05 DIAGNOSIS — N400 Enlarged prostate without lower urinary tract symptoms: Secondary | ICD-10-CM | POA: Diagnosis not present

## 2022-11-05 DIAGNOSIS — Z79899 Other long term (current) drug therapy: Secondary | ICD-10-CM

## 2022-11-05 DIAGNOSIS — I4891 Unspecified atrial fibrillation: Secondary | ICD-10-CM | POA: Diagnosis not present

## 2022-11-05 DIAGNOSIS — R109 Unspecified abdominal pain: Secondary | ICD-10-CM | POA: Diagnosis not present

## 2022-11-05 DIAGNOSIS — Z6822 Body mass index (BMI) 22.0-22.9, adult: Secondary | ICD-10-CM

## 2022-11-05 DIAGNOSIS — R4182 Altered mental status, unspecified: Secondary | ICD-10-CM | POA: Diagnosis not present

## 2022-11-05 DIAGNOSIS — R410 Disorientation, unspecified: Principal | ICD-10-CM | POA: Diagnosis present

## 2022-11-05 DIAGNOSIS — G459 Transient cerebral ischemic attack, unspecified: Secondary | ICD-10-CM | POA: Diagnosis not present

## 2022-11-05 DIAGNOSIS — Z8249 Family history of ischemic heart disease and other diseases of the circulatory system: Secondary | ICD-10-CM

## 2022-11-05 DIAGNOSIS — Z96611 Presence of right artificial shoulder joint: Secondary | ICD-10-CM | POA: Diagnosis present

## 2022-11-05 DIAGNOSIS — R0902 Hypoxemia: Secondary | ICD-10-CM | POA: Diagnosis not present

## 2022-11-05 DIAGNOSIS — R4189 Other symptoms and signs involving cognitive functions and awareness: Secondary | ICD-10-CM | POA: Diagnosis not present

## 2022-11-05 DIAGNOSIS — I2510 Atherosclerotic heart disease of native coronary artery without angina pectoris: Secondary | ICD-10-CM | POA: Diagnosis present

## 2022-11-05 DIAGNOSIS — Z9842 Cataract extraction status, left eye: Secondary | ICD-10-CM

## 2022-11-05 DIAGNOSIS — E876 Hypokalemia: Secondary | ICD-10-CM | POA: Diagnosis present

## 2022-11-05 DIAGNOSIS — Z9841 Cataract extraction status, right eye: Secondary | ICD-10-CM

## 2022-11-05 DIAGNOSIS — E539 Vitamin B deficiency, unspecified: Secondary | ICD-10-CM | POA: Diagnosis not present

## 2022-11-05 LAB — COMPREHENSIVE METABOLIC PANEL
ALT: 23 U/L (ref 0–44)
AST: 20 U/L (ref 15–41)
Albumin: 3.2 g/dL — ABNORMAL LOW (ref 3.5–5.0)
Alkaline Phosphatase: 49 U/L (ref 38–126)
Anion gap: 12 (ref 5–15)
BUN: 27 mg/dL — ABNORMAL HIGH (ref 8–23)
CO2: 20 mmol/L — ABNORMAL LOW (ref 22–32)
Calcium: 8.2 mg/dL — ABNORMAL LOW (ref 8.9–10.3)
Chloride: 107 mmol/L (ref 98–111)
Creatinine, Ser: 0.63 mg/dL (ref 0.61–1.24)
GFR, Estimated: >60 mL/min (ref >60)
Glucose, Bld: 160 mg/dL — ABNORMAL HIGH (ref 70–99)
Potassium: 3.3 mmol/L — ABNORMAL LOW (ref 3.5–5.1)
Sodium: 139 mmol/L (ref 135–145)
Total Bilirubin: 1.3 mg/dL — ABNORMAL HIGH (ref 0.3–1.2)
Total Protein: 6.1 g/dL — ABNORMAL LOW (ref 6.5–8.1)

## 2022-11-05 LAB — URINALYSIS, ROUTINE W REFLEX MICROSCOPIC
Bacteria, UA: NONE SEEN
Bilirubin Urine: NEGATIVE
Glucose, UA: NEGATIVE mg/dL
Ketones, ur: NEGATIVE mg/dL
Nitrite: NEGATIVE
Protein, ur: 100 mg/dL — AB
Specific Gravity, Urine: 1.023 (ref 1.005–1.030)
pH: 5 (ref 5.0–8.0)

## 2022-11-05 LAB — CBC WITH DIFFERENTIAL/PLATELET
Abs Immature Granulocytes: 0.04 10*3/uL (ref 0.00–0.07)
Basophils Absolute: 0 10*3/uL (ref 0.0–0.1)
Basophils Relative: 0 %
Eosinophils Absolute: 0 10*3/uL (ref 0.0–0.5)
Eosinophils Relative: 0 %
HCT: 51.1 % (ref 39.0–52.0)
Hemoglobin: 17.3 g/dL — ABNORMAL HIGH (ref 13.0–17.0)
Immature Granulocytes: 0 %
Lymphocytes Relative: 4 %
Lymphs Abs: 0.5 10*3/uL — ABNORMAL LOW (ref 0.7–4.0)
MCH: 32.2 pg (ref 26.0–34.0)
MCHC: 33.9 g/dL (ref 30.0–36.0)
MCV: 95 fL (ref 80.0–100.0)
Monocytes Absolute: 0.8 10*3/uL (ref 0.1–1.0)
Monocytes Relative: 6 %
Neutro Abs: 10.8 10*3/uL — ABNORMAL HIGH (ref 1.7–7.7)
Neutrophils Relative %: 90 %
Platelets: 149 10*3/uL — ABNORMAL LOW (ref 150–400)
RBC: 5.38 MIL/uL (ref 4.22–5.81)
RDW: 12.2 % (ref 11.5–15.5)
WBC: 12.3 10*3/uL — ABNORMAL HIGH (ref 4.0–10.5)
nRBC: 0 % (ref 0.0–0.2)

## 2022-11-05 LAB — LIPASE, BLOOD: Lipase: 47 U/L (ref 11–51)

## 2022-11-05 MED ORDER — PANTOPRAZOLE SODIUM 40 MG PO TBEC
40.0000 mg | DELAYED_RELEASE_TABLET | Freq: Two times a day (BID) | ORAL | Status: DC
  Administered 2022-11-05 – 2022-11-11 (×12): 40 mg via ORAL
  Filled 2022-11-05 (×12): qty 1

## 2022-11-05 MED ORDER — POLYETHYLENE GLYCOL 3350 17 G PO PACK: 17.0000 g | PACK | Freq: Every day | ORAL | Status: DC | PRN

## 2022-11-05 MED ORDER — TAMSULOSIN HCL 0.4 MG PO CAPS
0.4000 mg | ORAL_CAPSULE | Freq: Every day | ORAL | Status: DC
  Administered 2022-11-06 – 2022-11-11 (×6): 0.4 mg via ORAL
  Filled 2022-11-05 (×7): qty 1

## 2022-11-05 MED ORDER — LACTATED RINGERS IV BOLUS
1000.0000 mL | Freq: Once | INTRAVENOUS | Status: AC
  Administered 2022-11-05: 1000 mL via INTRAVENOUS

## 2022-11-05 MED ORDER — LOSARTAN POTASSIUM 50 MG PO TABS
25.0000 mg | ORAL_TABLET | Freq: Every day | ORAL | Status: DC
  Administered 2022-11-06 – 2022-11-11 (×5): 25 mg via ORAL
  Filled 2022-11-05 (×5): qty 1

## 2022-11-05 MED ORDER — DOCUSATE SODIUM 100 MG PO CAPS
200.0000 mg | ORAL_CAPSULE | Freq: Every day | ORAL | Status: DC
  Administered 2022-11-05 – 2022-11-10 (×6): 200 mg via ORAL
  Filled 2022-11-05 (×6): qty 2

## 2022-11-05 MED ORDER — LACTATED RINGERS IV SOLN: INTRAVENOUS | Status: DC

## 2022-11-05 MED ORDER — POLYVINYL ALCOHOL 1.4 % OP SOLN
1.0000 [drp] | Freq: Every day | OPHTHALMIC | Status: DC
  Administered 2022-11-05 – 2022-11-09 (×5): 1 [drp] via OPHTHALMIC
  Filled 2022-11-05: qty 15

## 2022-11-05 MED ORDER — ACETAMINOPHEN 500 MG PO TABS: 500.0000 mg | ORAL_TABLET | Freq: Two times a day (BID) | ORAL | Status: DC | PRN

## 2022-11-05 MED ORDER — IOHEXOL 300 MG/ML  SOLN
100.0000 mL | Freq: Once | INTRAMUSCULAR | Status: AC | PRN
  Administered 2022-11-05: 100 mL via INTRAVENOUS

## 2022-11-05 MED ORDER — POTASSIUM CHLORIDE CRYS ER 20 MEQ PO TBCR
20.0000 meq | EXTENDED_RELEASE_TABLET | Freq: Two times a day (BID) | ORAL | Status: AC
  Administered 2022-11-05 – 2022-11-07 (×4): 20 meq via ORAL
  Filled 2022-11-05 (×4): qty 1

## 2022-11-05 MED ORDER — METOPROLOL TARTRATE 25 MG PO TABS
12.5000 mg | ORAL_TABLET | Freq: Two times a day (BID) | ORAL | Status: DC
  Administered 2022-11-05 – 2022-11-11 (×11): 12.5 mg via ORAL
  Filled 2022-11-05 (×11): qty 1

## 2022-11-05 MED ORDER — METOCLOPRAMIDE HCL 5 MG PO TABS
5.0000 mg | ORAL_TABLET | Freq: Three times a day (TID) | ORAL | Status: DC
  Administered 2022-11-06 – 2022-11-11 (×17): 5 mg via ORAL
  Filled 2022-11-05 (×18): qty 1

## 2022-11-05 MED ORDER — SODIUM CHLORIDE (PF) 0.9 % IJ SOLN
INTRAMUSCULAR | Status: AC
  Filled 2022-11-05: qty 50

## 2022-11-05 MED ORDER — APIXABAN 5 MG PO TABS
5.0000 mg | ORAL_TABLET | Freq: Two times a day (BID) | ORAL | Status: DC
  Administered 2022-11-05 – 2022-11-11 (×12): 5 mg via ORAL
  Filled 2022-11-05 (×12): qty 1

## 2022-11-05 MED ORDER — ATORVASTATIN CALCIUM 20 MG PO TABS
20.0000 mg | ORAL_TABLET | ORAL | Status: DC
  Administered 2022-11-06 – 2022-11-09 (×2): 20 mg via ORAL
  Filled 2022-11-05 (×2): qty 1

## 2022-11-05 NOTE — ED Triage Notes (Signed)
Pt BIB EMS from home. Pt lives with family. Pt here at [facility] last week, pt c/o AMS, decreased mobility since discharge. Pt went home last week with a foley catheter and family c/o decreased urinary output. EMS reports pt is warm to touch. Pt hx of A Fib. Pt is alert and oriented x3, baseline is A&Ox4. Pt walks at baseline, today is a struggle for pt to walk per family.  132/78 HR 90 Resp 14 100% RA 27 Capnography 186 CBG

## 2022-11-05 NOTE — ED Notes (Signed)
Patient transported to CT 

## 2022-11-05 NOTE — H&P (Signed)
History and Physical    Joseph Simpson AQT:[id number] DOB: May 20, 1935 DOA: 11/05/2022  PCP: Marin Olp, MD  Patient coming from: Home  I have personally briefly reviewed patient's old medical records available.   Chief Complaint: Weakness, confusion  HPI: Joseph Simpson is a 87 y.o. male with medical history significant of hypertension, hyperlipidemia, chronic A-fib on Eliquis, coronary artery disease, history of partial gastrectomy and gastrojejunostomy 40 years ago who was recently admitted to the hospital with generalized weakness for a week and nausea vomiting for 1 day.  CT scan abdomen pelvis was unremarkable.  Patient was treated with Compazine Benadryl and discharged home.  He came back to the emergency room on 12/14 with severe epigastric pain radiating to the back.  Since then he was not admitted to the hospital.  CT angiogram abdomen pelvis did not show any acute vascular findings.  He also had urinary retention and discharged home with Foley catheter.  He had upper GI endoscopy done on 12/18 that showed esophageal plaques suspicious for candidiasis, biopsy negative for candida.   ER visit 12/12 Hospitalization 12/14-12/19.  Discharged home with family. Comes back with significantly decreased mobility, profound generalized weakness, difficulty standing and walking. Patient has been confused.  Mostly sleepy.  Not willing to eat or drink. In the emergency room when I interviewed the patient, patient does not talk much.  He answers few simple questions.  He is oriented to self and his family.  He is not oriented to time and place. ED Course: Afebrile.  Blood pressure stable.  WBC count 12.3.  Potassium 3.3.  On room air.  CT head essentially normal.  CT scan abdomen pelvis with contrast with no new findings.  Compressed bladder with Foley catheter.  Thickened bladder wall.  Due to significant debility, patient needing hospitalization and evaluation.  Urinalysis without bacteria.   Foley catheter was exchanged.  Urine culture was sent.  Antibiotics on hold.   Review of Systems: all systems are reviewed and pertinent positive as per HPI otherwise rest are negative.    Past Medical History:  Diagnosis Date   Aortic atherosclerosis ([place])    Arthritis    Atrial fibrillation ([place])    B12 deficiency    Bladder calculi    CAD (coronary artery disease)    cardiologist--- dr Martinique;   abnormal nuclear stress test 11/ 2013;  s/p cardiac cath 10-21-2012 minimal nonobstructiive CAD w/ normal LVF   Diverticulosis    Dyslipidemia    Esophagitis    Full dentures    Gastroparesis    followed by [name] PA ( [facility] in [place]) s/p gastrectomy for ulcers in 1970s   GERD (gastroesophageal reflux disease)    Heart murmur    Hiatal hernia    History of adenomatous polyp of colon    History of kidney stones    History of melanoma 2008   s/p left forearn wide local excision 06/ 2008   History of syncope 2010   cardiologist --- dr Martinique;  (11-25-2021 per pt no syncope or near syncope since)   Hypertension    Macular degeneration of both eyes    Nocturia associated with benign prostatic hyperplasia    OSA (obstructive sleep apnea)    no cpap   Polyneuropathy    PVC's (premature ventricular contractions) 2010   06/ 2010 per event monitor SR w/ occasional PVCs and 4 beat SVT (in epic)   Wears glasses     Past Surgical History:  Procedure Laterality Date   APPENDECTOMY     child   CARDIAC CATHETERIZATION  10/2012   minimal nonobstructive CAD with normal LV function   CATARACT EXTRACTION W/ INTRAOCULAR LENS IMPLANT Bilateral    2009 approx   CYSTOSCOPY WITH LITHOLAPAXY N/A 07/13/2016   Procedure: CYSTOSCOPY WITH LITHOLAPAXY;  Surgeon: Franchot Gallo, MD;  Location: [facility];  Service: Urology;  Laterality: N/A;   CYSTOSCOPY WITH LITHOLAPAXY N/A 12/01/2021   Procedure: CYSTOSCOPY WITH LITHOLAPAXY;  Surgeon: Franchot Gallo, MD;  Location: [facility];  Service: Urology;  Laterality: N/A;   CYSTOSCOPY/RETROGRADE/URETEROSCOPY/STONE EXTRACTION WITH BASKET  02/24/2012   Procedure: CYSTOSCOPY/RETROGRADE/URETEROSCOPY/STONE EXTRACTION WITH BASKET;  Surgeon: Franchot Gallo, MD;  Location: [facility];  Service: Urology;  Laterality: Bilateral;  1 HOUR   ESOPHAGOGASTRODUODENOSCOPY N/A 01/06/2022   Procedure: ESOPHAGOGASTRODUODENOSCOPY (EGD);  Surgeon: Arta Silence, MD;  Location: [facility];  Service: Gastroenterology;  Laterality: N/A;   EXTRACORPOREAL SHOCK WAVE LITHOTRIPSY  11/16/2011   _0    GASTRECTOMY     1970s  for ulcers   HEMORRHOID SURGERY     yrs ago   HOLMIUM LASER APPLICATION N/A 7/93/9030   Procedure: HOLMIUM LASER APPLICATION;  Surgeon: Franchot Gallo, MD;  Location: [facility];  Service: Urology;  Laterality: N/A;   INGUINAL HERNIA REPAIR Left 07/13/2016   Procedure: REPAIR LEFT INGUINAL HERNIA;  Surgeon: Armandina Gemma, MD;  Location: [facility];  Service: General;  Laterality: Left;   INSERTION OF MESH Left 07/13/2016   Procedure: INSERTION OF MESH;  Surgeon: Armandina Gemma, MD;  Location: [facility];  Service: General;  Laterality: Left;   MELANOMA EXCISION Left 05/05/2007   _1  ;   LEFT FOREARM   TONSILLECTOMY     child   TOTAL HIP ARTHROPLASTY Left 02/11/2021   Procedure: TOTAL HIP ARTHROPLASTY;  Surgeon: Marchia Bond, MD;  Location: [facility];  Service: Orthopedics;  Laterality: Left;   TOTAL SHOULDER ARTHROPLASTY Right 12/29/2017   Procedure: TOTAL SHOULDER ARTHROPLASTY;  Surgeon: Hiram Gash, MD;  Location: [facility];  Service: Orthopedics;  Laterality: Right;    Social history   reports that he quit smoking about 66 years ago. His smoking use included cigarettes. He has a 0.40 pack-year smoking history. He has never used smokeless tobacco. He reports that he does not drink alcohol and does not use drugs.  Allergies  Allergen Reactions   Tape Other (See Comments)    SKIN IS  FRAGILE AND WILL TEAR EASILY- PAPER TAPE ONLY   Aspirin Other (See Comments)    "UPSETS THE STOMACH"   Percocet [Oxycodone-Acetaminophen] Nausea And Vomiting   Vitamins [Apatate] Other (See Comments)    "Bothers the stomach"    Family History  Problem Relation Age of Onset   Heart disease Mother        CABG in her 35s   Pneumonia Father    Cancer Sister        breast   Lung cancer Brother        smoker     Prior to Admission medications   Medication Sig Start Date End Date Taking? Authorizing Provider  acetaminophen (TYLENOL) 500 MG tablet Take 500-1,000 mg by mouth 2 (two) times daily as needed for mild pain, moderate pain or headache.   Yes [provider]  apixaban (ELIQUIS) 5 MG TABS tablet Take 1 tablet (5 mg total) by mouth 2 (two) times daily. 02/24/22  Yes Martinique, Peter M, MD  atorvastatin (LIPITOR) 20 MG tablet TAKE  ONE TABLET BY MOUTH THREE TIMES A WEEK Patient taking differently: Take 20 mg by mouth every Monday, Wednesday, and Friday at 8 PM. TAKE ONE TABLET BY MOUTH THREE TIMES A WEEK 12/18/21  Yes Marin Olp, MD  BENADRYL ALLERGY 25 MG tablet Take 12.5 mg by mouth See admin instructions. Take 12.5 mg by mouth every 8 hours when taking Prochlorperazine   Yes [provider]  bevacizumab (AVASTIN) 2.5 mg/0.1 mL SOLN 2.5 mg by Intravitreal route See admin instructions. Injection to eyes every 4 weeks - use with vigamox   Yes [provider]  Carboxymethylcellulose Sodium 1 % GEL Place 1 drop into both eyes at bedtime.   Yes [provider]  Cyanocobalamin 1000 MCG/ML KIT Inject 1,000 mcg as directed every 30 (thirty) days.   Yes [provider]  docusate sodium (COLACE) 100 MG capsule Take 200 mg by mouth at bedtime.   Yes [provider]  losartan (COZAAR) 25 MG tablet Take 0.5-1 tablets (12.5-25 mg total) by mouth daily. If blood pressure over 145 when you check- ok to take other half tablet once per day (so total  of 88m per day) Patient taking differently: Take 12.5 mg by mouth See admin instructions. Take 12.5 mg by mouth once a day and if Systolic B/P number is over 145 when checked, may take an additional 12.5 mg once a day (max total of 25 mg/day) 09/21/22  Yes HMarin Olp MD  metoCLOPramide (REGLAN) 5 MG tablet Take 1 tablet (5 mg total) by mouth 3 (three) times daily before meals for 14 days. 11/03/22 11/17/22 Yes Dahal, BMarlowe Aschoff MD  metoprolol tartrate (LOPRESSOR) 25 MG tablet Take 12.5 mg by mouth 2 (two) times daily.   Yes [provider]  pantoprazole (PROTONIX) 40 MG tablet Take 1 tablet (40 mg total) by mouth 2 (two) times daily. 11/03/22 02/01/23 Yes Dahal, BMarlowe Aschoff MD  polyethylene glycol (MIRALAX / GLYCOLAX) 17 g packet Take 17 g by mouth daily as needed for moderate constipation (mix and drink as directed).   Yes [provider]  prochlorperazine (COMPAZINE) 10 MG tablet Take 1 tablet (10 mg total) by mouth every 8 (eight) hours as needed for nausea or vomiting (take with 12.528mbenadryl please). 10/27/22  Yes Small, Brooke L, PA  Propylene Glycol (SYSTANE BALANCE OP) Place 1 drop into both eyes in the morning and at bedtime.   Yes [provider]  tamsulosin (FLOMAX) 0.4 MG CAPS capsule Take 0.4 mg by mouth daily. 12/30/21  Yes [provider]  VIGAMOX 0.5 % ophthalmic solution Place 1 drop into both eyes See admin instructions. Instill every 4 weeks as directed - use with avastin, instill 1 drop into both eyes 4 times daily for 2 days after eye injections 05/28/14  Yes [provider]    Physical Exam: Vitals:   11/05/22 1500 11/05/22 1517 11/05/22 1530 11/05/22 1658  BP: 135/67  (!) 148/62 139/62  Pulse: (!) 46  92 (!) 48  Resp: _0 Temp:  97.6 F (36.4 C)    TempSrc:  Oral    SpO2: 97%  98% 97%    Constitutional: NAD, calm, comfortable.  Frail and debilitated. Vitals:   11/05/22 1500 11/05/22 1517 11/05/22 1530 11/05/22 1658   BP: 135/67  (!) 148/62 139/62  Pulse: (!) 46  92 (!) 48  Resp: _1 Temp:  97.6 F (36.4 C)    TempSrc:  Oral  SpO2: 97%  98% 97%   Eyes: PERRL, lids and conjunctivae normal ENMT: Mucous membranes are dry. Posterior pharynx clear of any exudate or lesions.Normal dentition.  Neck: normal, supple, no masses, no thyromegaly. Respiratory: clear to auscultation bilaterally, no wheezing, no crackles. Normal respiratory effort. No accessory muscle use.  Cardiovascular: Regular rate and rhythm, no murmurs / rubs / gallops. No extremity edema. 2+ pedal pulses. No carotid bruits.  Abdomen: Mild tenderness on deep palpation.  No masses palpated. No hepatosplenomegaly. Bowel sounds positive.  Foley catheter with clear urine. Musculoskeletal: no clubbing / cyanosis. No joint deformity upper and lower extremities. Good ROM, no contractures. Normal muscle tone.  Skin: no rashes, lesions, ulcers. No induration Neurologic: CN 2-12 grossly intact. Sensation intact, DTR normal. Strength equal in all extremities. Psychiatric: impaired judgment and insight. Alert and oriented x1-2.  No focal deficits.  Denies any hallucinations or delusions.    Labs on Admission: I have personally reviewed following labs and imaging studies  CBC: Recent Labs  Lab 10/29/22 2118 10/30/22 0547 11/05/22 1237  WBC 13.3* 10.0 12.3*  NEUTROABS  --   --  10.8*  HGB 16.2 16.4 17.3*  HCT 46.8 47.7 51.1  MCV 93.8 94.5 95.0  PLT 191 175 827*   Basic Metabolic Panel: Recent Labs  Lab 10/30/22 0547 11/05/22 1237  NA 141 139  K 3.2* 3.3*  CL 101 107  CO2 27 20*  GLUCOSE 151* 160*  BUN 18 27*  CREATININE 0.62 0.63  CALCIUM 9.5 8.2*   GFR: Estimated Creatinine Clearance: 74.5 mL/min (by C-G formula based on SCr of 0.63 mg/dL). Liver Function Tests: Recent Labs  Lab 10/30/22 0547 11/05/22 1237  AST 19 20  ALT 15 23  ALKPHOS 57 49  BILITOT 1.5* 1.3*  PROT 7.9 6.1*  ALBUMIN 4.5 3.2*   Recent Labs   Lab 11/05/22 1237  LIPASE 47   No results for input(s): "AMMONIA" in the last 168 hours. Coagulation Profile: No results for input(s): "INR", "PROTIME" in the last 168 hours. Cardiac Enzymes: No results for input(s): "CKTOTAL", "CKMB", "CKMBINDEX", "TROPONINI" in the last 168 hours. BNP (last 3 results) No results for input(s): "PROBNP" in the last 8760 hours. HbA1C: No results for input(s): "HGBA1C" in the last 72 hours. CBG: No results for input(s): "GLUCAP" in the last 168 hours. Lipid Profile: No results for input(s): "CHOL", "HDL", "LDLCALC", "TRIG", "CHOLHDL", "LDLDIRECT" in the last 72 hours. Thyroid Function Tests: No results for input(s): "TSH", "T4TOTAL", "FREET4", "T3FREE", "THYROIDAB" in the last 72 hours. Anemia Panel: No results for input(s): "VITAMINB12", "FOLATE", "FERRITIN", "TIBC", "IRON", "RETICCTPCT" in the last 72 hours. Urine analysis:    Component Value Date/Time   COLORURINE YELLOW 11/05/2022 1222   APPEARANCEUR HAZY (A) 11/05/2022 1222   LABSPEC 1.023 11/05/2022 1222   PHURINE 5.0 11/05/2022 1222   GLUCOSEU NEGATIVE 11/05/2022 1222   HGBUR LARGE (A) 11/05/2022 1222   BILIRUBINUR NEGATIVE 11/05/2022 1222   KETONESUR NEGATIVE 11/05/2022 1222   PROTEINUR 100 (A) 11/05/2022 1222   NITRITE NEGATIVE 11/05/2022 1222   LEUKOCYTESUR MODERATE (A) 11/05/2022 1222    Radiological Exams on Admission: CT ABDOMEN PELVIS W CONTRAST  Result Date: 11/05/2022 CLINICAL DATA:  Epigastric pain EXAM: CT ABDOMEN AND PELVIS WITH CONTRAST TECHNIQUE: Multidetector CT imaging of the abdomen and pelvis was performed using the standard protocol following bolus administration of intravenous contrast. RADIATION DOSE REDUCTION: This exam was performed according to the departmental dose-optimization program which includes automated exposure control, adjustment of the mA  and/or kV according to patient size and/or use of iterative reconstruction technique. CONTRAST:  141m OMNIPAQUE  IOHEXOL 300 MG/ML  SOLN COMPARISON:  CT 10/29/2022, 10/27/2022 FINDINGS: Lower chest: Lung bases demonstrate no acute consolidation or effusion. Mild lower lobe bronchial wall thickening and subpleural reticulation. Hepatobiliary: No focal liver abnormality is seen. No gallstones, gallbladder wall thickening, or biliary dilatation. Pancreas: Unremarkable. No pancreatic ductal dilatation or surrounding inflammatory changes. Spleen: Normal in size without focal abnormality. Adrenals/Urinary Tract: Adrenal glands are normal. Kidneys show no hydronephrosis. The previously noted right UVJ stone is no longer visualized no bladder assessment is limited by artifact from hip hardware. Catheter in the bladder. Thick-walled appearance of the bladder. Cortical scarring right kidney. Small nonobstructing stones at the lower pole right kidney. Stomach/Bowel: Stomach is moderately distended with fluid. Status post partial gastrectomy with left abdominal gastro jejunostomy. Remainder of small bowel is nondilated. There is no acute bowel wall thickening. 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 Vascular/Lymphatic: Moderate aortic atherosclerosis. No aneurysm. No suspicious lymph nodes. Reproductive: Enlarged prostate Other: Negative for pelvic effusion or free air. Musculoskeletal: Left hip replacement with artifact. No acute osseous abnormality. IMPRESSION: 1. Status post partial gastrectomy with gastrojejunostomy. There is moderate fluid distension of the stomach, if outlet obstruction is suspected, correlation with UGI could be considered. Small bowel is otherwise nondistended and there is no evidence for small bowel obstruction. 2. Thick-walled urinary bladder which is decompressed by Foley catheter. Previously noted right UVJ stone is no longer visualized. Nonobstructing right kidney stones. 3. Diverticular disease of the colon without acute wall thickening Aortic Atherosclerosis (ICD10-I70.0). Electronically Signed   By:  KDonavan FoilM.D.   On: 11/05/2022 16:33   CT Head Wo Contrast  Result Date: 11/05/2022 CLINICAL DATA:  Altered mental status EXAM: CT HEAD WITHOUT CONTRAST TECHNIQUE: Contiguous axial images were obtained from the base of the skull through the vertex without intravenous contrast. RADIATION DOSE REDUCTION: This exam was performed according to the departmental dose-optimization program which includes automated exposure control, adjustment of the mA and/or kV according to patient size and/or use of iterative reconstruction technique. COMPARISON:  07/23/2011 FINDINGS: Brain: No acute intracranial findings are seen. There are no signs of bleeding within the cranium. Cortical sulci are prominent. There is decreased density in periventricular white matter. There is no focal edema or mass effect. Vascular: Scattered arterial calcifications are seen. Skull: Unremarkable. Sinuses/Orbits: Unremarkable. Other: None. IMPRESSION: No acute intracranial findings are seen in noncontrast CT brain. Atrophy. Small-vessel disease. Electronically Signed   By: PElmer PickerM.D.   On: 11/05/2022 16:20    EKG: Independently reviewed.  Not done today.  Previous EKGs reviewed with sinus rhythm.  Assessment/Plan Principal Problem:   Altered mental status     1.  Altered mental status: Suspect delirium due to recent hospitalization.  No history of underlying dementia.  Nonfocal exam.  Already on monthly injections of B12 and recent B12 levels were more than thousand.  CT head was negative.  Will monitor.  Day night regulation.  Mobility and improvement nutrition. Currently no evidence of infection.  Urine culture will be sent but will not start on any antibiotics now.  Since patient is fairly stable, will monitor in the hospital.  Hydrate with IV fluids.  Encourage oral intake.  PT OT.  2.  Nausea, poor appetite and esophagitis: Recent EGD 12/18.  Biopsy negative for Candida.  Continue PPI twice daily along with  Reglan before meals to encourage oral intake.  3.  Paroxysmal atrial fibrillation: Rate controlled.  On metoprolol.  Therapeutic on Eliquis.  Continue.  No active bleeding.  4.  Essential hypertension: Blood pressure stable on current regimen of metoprolol and losartan.  Continue.  5.  Hypokalemia: Replace and monitor.  Check magnesium.  6.  Prostate hypertrophy with acute urinary retention: Catheter exchange.  Outpatient urology follow-up.    DVT prophylaxis: Eliquis Code Status: Full code Family Communication: Son and daughter-in-law are at home. Disposition Plan: Home when stable Consults called: None Admission status: Observation.  Cardiac telemetry.   Barb Merino MD [facility] Pager [phone]

## 2022-11-05 NOTE — ED Provider Notes (Signed)
[facility] DEPT Provider Note   CSN: [id number] Arrival date & time: 11/05/22  1201     History  Chief Complaint  Patient presents with   Altered Mental Status    Joseph Simpson is a 87 y.o. male.  Pt is an 87y/o male with hx of HTN, HLD, A-fib, CAD, who had partial gastrectomy and gastrojejunostomy 40 years ago for a bleeding ulcer, severe esophagitis on EGD 12/2021, nephrolithiasis, OSA, B12 deficiency, polyneuropathy who was hospitalized last week for recurrent nausea vomiting and abdominal pain had negative imaging with normal mesenteric vessels but found to have severe gastritis and concern for possible Candida on EGD done early this week.  Patient was continuing Reglan and PPIs.  Patient also had urinary retention due to bladder outlet obstruction from prostatic enlargement while hospitalized and went home with a Foley catheter.  Since being home patient has had significant decreased mobility, generalized weakness having difficulty standing and walking per family.  Family called EMS today because they were concerned that the patient may be dehydrated or getting a UTI because he had felt warm and had decreased urine output.  They report he has not been really eating or drinking much since being home.  Patient denies any abdominal pain at this time does admit that he has not been eating or drinking much and reports he just does not feel hungry.   The history is provided by the patient, the spouse, the EMS personnel and medical records.  Altered Mental Status      Home Medications Prior to Admission medications   Medication Sig Start Date End Date Taking? Authorizing Provider  acetaminophen (TYLENOL) 500 MG tablet Take 500-1,000 mg by mouth 2 (two) times daily as needed for mild pain, moderate pain or headache.   Yes [provider]  apixaban (ELIQUIS) 5 MG TABS tablet Take 1 tablet (5 mg total) by mouth 2 (two) times daily. 02/24/22  Yes  Martinique, Peter M, MD  atorvastatin (LIPITOR) 20 MG tablet TAKE ONE TABLET BY MOUTH THREE TIMES A WEEK Patient taking differently: Take 20 mg by mouth every Monday, Wednesday, and Friday at 8 PM. TAKE ONE TABLET BY MOUTH THREE TIMES A WEEK 12/18/21  Yes Marin Olp, MD  BENADRYL ALLERGY 25 MG tablet Take 12.5 mg by mouth See admin instructions. Take 12.5 mg by mouth every 8 hours when taking Prochlorperazine   Yes [provider]  bevacizumab (AVASTIN) 2.5 mg/0.1 mL SOLN 2.5 mg by Intravitreal route See admin instructions. Injection to eyes every 4 weeks - use with vigamox   Yes [provider]  Carboxymethylcellulose Sodium 1 % GEL Place 1 drop into both eyes at bedtime.   Yes [provider]  Cyanocobalamin 1000 MCG/ML KIT Inject 1,000 mcg as directed every 30 (thirty) days.   Yes [provider]  docusate sodium (COLACE) 100 MG capsule Take 200 mg by mouth at bedtime.   Yes [provider]  losartan (COZAAR) 25 MG tablet Take 0.5-1 tablets (12.5-25 mg total) by mouth daily. If blood pressure over 145 when you check- ok to take other half tablet once per day (so total of 4m per day) Patient taking differently: Take 12.5 mg by mouth See admin instructions. Take 12.5 mg by mouth once a day and if Systolic B/P number is over 145 when checked, may take an additional 12.5 mg once a day (max total of 25 mg/day) 09/21/22  Yes HMarin Olp MD  metoCLOPramide (REGLAN) 5  MG tablet Take 1 tablet (5 mg total) by mouth 3 (three) times daily before meals for 14 days. 11/03/22 11/17/22 Yes Dahal, Marlowe Aschoff, MD  metoprolol tartrate (LOPRESSOR) 25 MG tablet Take 12.5 mg by mouth 2 (two) times daily.   Yes [provider]  pantoprazole (PROTONIX) 40 MG tablet Take 1 tablet (40 mg total) by mouth 2 (two) times daily. 11/03/22 02/01/23 Yes Dahal, Marlowe Aschoff, MD  polyethylene glycol (MIRALAX / GLYCOLAX) 17 g packet Take 17 g by mouth daily as needed for moderate  constipation (mix and drink as directed).   Yes [provider]  prochlorperazine (COMPAZINE) 10 MG tablet Take 1 tablet (10 mg total) by mouth every 8 (eight) hours as needed for nausea or vomiting (take with 12.74m benadryl please). 10/27/22  Yes Small, Brooke L, PA  Propylene Glycol (SYSTANE BALANCE OP) Place 1 drop into both eyes in the morning and at bedtime.   Yes [provider]  tamsulosin (FLOMAX) 0.4 MG CAPS capsule Take 0.4 mg by mouth daily. 12/30/21  Yes [provider]  VIGAMOX 0.5 % ophthalmic solution Place 1 drop into both eyes See admin instructions. Instill every 4 weeks as directed - use with avastin, instill 1 drop into both eyes 4 times daily for 2 days after eye injections 05/28/14  Yes [provider]      Allergies    Tape, Aspirin, Percocet [oxycodone-acetaminophen], and Vitamins [apatate]    Review of Systems   Review of Systems  Physical Exam Updated Vital Signs BP (!) 148/62   Pulse 92   Temp 97.6 F (36.4 C) (Oral)   Resp 18   SpO2 98%  Physical Exam Vitals and nursing note reviewed.  Constitutional:      General: He is not in acute distress.    Appearance: He is well-developed.  HENT:     Head: Normocephalic and atraumatic.  Eyes:     Conjunctiva/sclera: Conjunctivae normal.     Pupils: Pupils are equal, round, and reactive to light.  Cardiovascular:     Rate and Rhythm: Normal rate and regular rhythm.     Heart sounds: No murmur heard. Pulmonary:     Effort: Pulmonary effort is normal. No respiratory distress.     Breath sounds: Normal breath sounds. No wheezing or rales.  Abdominal:     General: There is no distension.     Palpations: Abdomen is soft.     Tenderness: There is no abdominal tenderness. There is no right CVA tenderness, left CVA tenderness, guarding or rebound.     Comments: Mild epigastric tenderness  Genitourinary:    Comments: Foley catheter in place with approximately 200 mL of urine in the  bag. Musculoskeletal:        General: No tenderness. Normal range of motion.     Cervical back: Normal range of motion and neck supple.  Skin:    General: Skin is warm and dry.     Findings: No erythema or rash.  Neurological:     Mental Status: He is alert.     Comments: Patient is fairly quiet and does not say a whole lot but is able to answer questions appropriately.  He is able to sit up independently on his own in bed without help.  Moves upper and lower extremities symmetrically.  Psychiatric:        Behavior: Behavior normal.     ED Results / Procedures / Treatments   Labs (all labs ordered are listed, but only abnormal  results are displayed) Labs Reviewed  COMPREHENSIVE METABOLIC PANEL - Abnormal; Notable for the following components:      Result Value   Potassium 3.3 (*)    CO2 20 (*)    Glucose, Bld 160 (*)    BUN 27 (*)    Calcium 8.2 (*)    Total Protein 6.1 (*)    Albumin 3.2 (*)    Total Bilirubin 1.3 (*)    All other components within normal limits  URINALYSIS, ROUTINE W REFLEX MICROSCOPIC - Abnormal; Notable for the following components:   APPearance HAZY (*)    Hgb urine dipstick LARGE (*)    Protein, ur 100 (*)    Leukocytes,Ua MODERATE (*)    All other components within normal limits  CBC WITH DIFFERENTIAL/PLATELET - Abnormal; Notable for the following components:   WBC 12.3 (*)    Hemoglobin 17.3 (*)    Platelets 149 (*)    Neutro Abs 10.8 (*)    Lymphs Abs 0.5 (*)    All other components within normal limits  URINE CULTURE  LIPASE, BLOOD  CBC WITH DIFFERENTIAL/PLATELET    EKG None  Radiology CT ABDOMEN PELVIS W CONTRAST  Result Date: 11/05/2022 CLINICAL DATA:  Epigastric pain EXAM: CT ABDOMEN AND PELVIS WITH CONTRAST TECHNIQUE: Multidetector CT imaging of the abdomen and pelvis was performed using the standard protocol following bolus administration of intravenous contrast. RADIATION DOSE REDUCTION: This exam was performed according to the  departmental dose-optimization program which includes automated exposure control, adjustment of the mA and/or kV according to patient size and/or use of iterative reconstruction technique. CONTRAST:  17m OMNIPAQUE IOHEXOL 300 MG/ML  SOLN COMPARISON:  CT 10/29/2022, 10/27/2022 FINDINGS: Lower chest: Lung bases demonstrate no acute consolidation or effusion. Mild lower lobe bronchial wall thickening and subpleural reticulation. Hepatobiliary: No focal liver abnormality is seen. No gallstones, gallbladder wall thickening, or biliary dilatation. Pancreas: Unremarkable. No pancreatic ductal dilatation or surrounding inflammatory changes. Spleen: Normal in size without focal abnormality. Adrenals/Urinary Tract: Adrenal glands are normal. Kidneys show no hydronephrosis. The previously noted right UVJ stone is no longer visualized no bladder assessment is limited by artifact from hip hardware. Catheter in the bladder. Thick-walled appearance of the bladder. Cortical scarring right kidney. Small nonobstructing stones at the lower pole right kidney. Stomach/Bowel: Stomach is moderately distended with fluid. Status post partial gastrectomy with left abdominal gastro jejunostomy. Remainder of small bowel is nondilated. There is no acute bowel wall thickening. 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 Vascular/Lymphatic: Moderate aortic atherosclerosis. No aneurysm. No suspicious lymph nodes. Reproductive: Enlarged prostate Other: Negative for pelvic effusion or free air. Musculoskeletal: Left hip replacement with artifact. No acute osseous abnormality. IMPRESSION: 1. Status post partial gastrectomy with gastrojejunostomy. There is moderate fluid distension of the stomach, if outlet obstruction is suspected, correlation with UGI could be considered. Small bowel is otherwise nondistended and there is no evidence for small bowel obstruction. 2. Thick-walled urinary bladder which is decompressed by Foley catheter. Previously  noted right UVJ stone is no longer visualized. Nonobstructing right kidney stones. 3. Diverticular disease of the colon without acute wall thickening Aortic Atherosclerosis (ICD10-I70.0). Electronically Signed   By: KDonavan FoilM.D.   On: 11/05/2022 16:33   CT Head Wo Contrast  Result Date: 11/05/2022 CLINICAL DATA:  Altered mental status EXAM: CT HEAD WITHOUT CONTRAST TECHNIQUE: Contiguous axial images were obtained from the base of the skull through the vertex without intravenous contrast. RADIATION DOSE REDUCTION: This exam was performed according to  the departmental dose-optimization program which includes automated exposure control, adjustment of the mA and/or kV according to patient size and/or use of iterative reconstruction technique. COMPARISON:  07/23/2011 FINDINGS: Brain: No acute intracranial findings are seen. There are no signs of bleeding within the cranium. Cortical sulci are prominent. There is decreased density in periventricular white matter. There is no focal edema or mass effect. Vascular: Scattered arterial calcifications are seen. Skull: Unremarkable. Sinuses/Orbits: Unremarkable. Other: None. IMPRESSION: No acute intracranial findings are seen in noncontrast CT brain. Atrophy. Small-vessel disease. Electronically Signed   By: Elmer Picker M.D.   On: 11/05/2022 16:20    Procedures Procedures    Medications Ordered in ED Medications  sodium chloride (PF) 0.9 % injection (has no administration in time range)  lactated ringers bolus 1,000 mL (0 mLs Intravenous Stopped 11/05/22 1503)  iohexol (OMNIPAQUE) 300 MG/ML solution 100 mL (100 mLs Intravenous Contrast Given 11/05/22 1550)    ED Course/ Medical Decision Making/ A&P                           Medical Decision Making Amount and/or Complexity of Data Reviewed Labs: ordered. Decision-making details documented in ED Course. Radiology: ordered and independent interpretation performed. Decision-making details  documented in ED Course.  Risk Prescription drug management.   Pt with multiple medical problems and comorbidities and presenting today with a complaint that caries a high risk for morbidity and mortality.  With recent hospitalization and returning today due to decreased mobility, confusion since being home from the hospital.  Patient is not having any nausea and vomiting per him but has had poor oral intake.  He recently had an endoscopy that showed severe gastritis and is currently on therapies.  He has some minimal epigastric pain on exam but no other acute findings.  He is able to sit up independently and move all extremities with low suspicion for stroke today.  He does have urine in the bag and low suspicion for outlet obstruction at this time.  However family felt that he was warm and was concerned he may be getting a UTI.  Will check urine and labs to ensure no new AKI or electrolyte abnormality.  Patient may just have deconditioning from recent hospitalization as well as some mild dehydration due to poor oral intake.  Will wait for family to arrive for further history.  4:53 PM Spoke with patient's son Andrik on the phone and he reports that his diet has only been home a day and a half.  Prior to leaving the hospital he thought he noticed that he was confused but since being home he has been talking out of his head.  He is so weak he cannot get out of bed and his son reports they stood him once before he went home but he did not walk.  Prior to being hospitalized patient was of normal mental status without history of dementia and was buying his own groceries and only occasionally using a cane.  He has had no further vomiting and they have been giving him his medications.  I independently interpreted patient's labs today and CBC with mild leukocytosis of 12, CMP with preserved GFR with normal creatinine, minimal hypokalemia 3.3 and lipase within normal limits.  UA does show moderate leukocytes but no  bacteria.  He does have a prior history of ESBL E. coli.  Unclear the cause of patient's delirium.  Also son reports they are having a hard  time taking care of him at home because he is unable to get out of bed. 4:53 PM I have independently visualized and interpreted pt's images today.  Head CT was negative for acute findings, CT of the abdomen pelvis without evidence of pyelonephritis but radiology reports thickened urinary bladder which is decompressed by a Foley and UVJ stone is no longer visualized no acute findings except for fluid distention of the stomach and could do an upper GI for suspected outlet obstruction.  Patient has not had vomiting and lower suspicion for that at this time.  Does have a narrowing as seen on EGD recently.  Given patient's new worsening delirium, weakness unknown etiology concern for possible developing UTI with history of ESBL in the past will readmit for further care.          Final Clinical Impression(s) / ED Diagnoses Final diagnoses:  Delirium    Rx / DC Orders ED Discharge Orders     None         Blanchie Dessert, MD 11/05/22 1728

## 2022-11-06 ENCOUNTER — Observation Stay (HOSPITAL_COMMUNITY): Payer: Medicare Other

## 2022-11-06 DIAGNOSIS — Z85820 Personal history of malignant melanoma of skin: Secondary | ICD-10-CM | POA: Diagnosis not present

## 2022-11-06 DIAGNOSIS — I10 Essential (primary) hypertension: Secondary | ICD-10-CM | POA: Diagnosis present

## 2022-11-06 DIAGNOSIS — Z961 Presence of intraocular lens: Secondary | ICD-10-CM | POA: Diagnosis present

## 2022-11-06 DIAGNOSIS — Z7401 Bed confinement status: Secondary | ICD-10-CM | POA: Diagnosis not present

## 2022-11-06 DIAGNOSIS — R338 Other retention of urine: Secondary | ICD-10-CM | POA: Diagnosis present

## 2022-11-06 DIAGNOSIS — M6259 Muscle wasting and atrophy, not elsewhere classified, multiple sites: Secondary | ICD-10-CM | POA: Diagnosis not present

## 2022-11-06 DIAGNOSIS — E785 Hyperlipidemia, unspecified: Secondary | ICD-10-CM | POA: Diagnosis present

## 2022-11-06 DIAGNOSIS — K3184 Gastroparesis: Secondary | ICD-10-CM | POA: Diagnosis present

## 2022-11-06 DIAGNOSIS — I480 Paroxysmal atrial fibrillation: Secondary | ICD-10-CM | POA: Diagnosis present

## 2022-11-06 DIAGNOSIS — Z87891 Personal history of nicotine dependence: Secondary | ICD-10-CM | POA: Diagnosis not present

## 2022-11-06 DIAGNOSIS — Z91048 Other nonmedicinal substance allergy status: Secondary | ICD-10-CM | POA: Diagnosis not present

## 2022-11-06 DIAGNOSIS — R630 Anorexia: Secondary | ICD-10-CM | POA: Diagnosis present

## 2022-11-06 DIAGNOSIS — Z7901 Long term (current) use of anticoagulants: Secondary | ICD-10-CM | POA: Diagnosis not present

## 2022-11-06 DIAGNOSIS — I482 Chronic atrial fibrillation, unspecified: Secondary | ICD-10-CM | POA: Diagnosis present

## 2022-11-06 DIAGNOSIS — Z885 Allergy status to narcotic agent status: Secondary | ICD-10-CM | POA: Diagnosis not present

## 2022-11-06 DIAGNOSIS — K2100 Gastro-esophageal reflux disease with esophagitis, without bleeding: Secondary | ICD-10-CM | POA: Diagnosis present

## 2022-11-06 DIAGNOSIS — B952 Enterococcus as the cause of diseases classified elsewhere: Secondary | ICD-10-CM | POA: Diagnosis present

## 2022-11-06 DIAGNOSIS — N401 Enlarged prostate with lower urinary tract symptoms: Secondary | ICD-10-CM | POA: Diagnosis present

## 2022-11-06 DIAGNOSIS — N400 Enlarged prostate without lower urinary tract symptoms: Secondary | ICD-10-CM | POA: Diagnosis not present

## 2022-11-06 DIAGNOSIS — E876 Hypokalemia: Secondary | ICD-10-CM | POA: Diagnosis present

## 2022-11-06 DIAGNOSIS — R2681 Unsteadiness on feet: Secondary | ICD-10-CM | POA: Diagnosis not present

## 2022-11-06 DIAGNOSIS — R4182 Altered mental status, unspecified: Secondary | ICD-10-CM | POA: Diagnosis not present

## 2022-11-06 DIAGNOSIS — G629 Polyneuropathy, unspecified: Secondary | ICD-10-CM | POA: Diagnosis present

## 2022-11-06 DIAGNOSIS — N139 Obstructive and reflux uropathy, unspecified: Secondary | ICD-10-CM | POA: Diagnosis not present

## 2022-11-06 DIAGNOSIS — I4891 Unspecified atrial fibrillation: Secondary | ICD-10-CM | POA: Diagnosis not present

## 2022-11-06 DIAGNOSIS — N390 Urinary tract infection, site not specified: Secondary | ICD-10-CM | POA: Diagnosis present

## 2022-11-06 DIAGNOSIS — Z8249 Family history of ischemic heart disease and other diseases of the circulatory system: Secondary | ICD-10-CM | POA: Diagnosis not present

## 2022-11-06 DIAGNOSIS — R410 Disorientation, unspecified: Principal | ICD-10-CM | POA: Diagnosis present

## 2022-11-06 DIAGNOSIS — H3530 Unspecified macular degeneration: Secondary | ICD-10-CM | POA: Diagnosis not present

## 2022-11-06 DIAGNOSIS — K219 Gastro-esophageal reflux disease without esophagitis: Secondary | ICD-10-CM | POA: Diagnosis not present

## 2022-11-06 DIAGNOSIS — R112 Nausea with vomiting, unspecified: Secondary | ICD-10-CM | POA: Diagnosis not present

## 2022-11-06 DIAGNOSIS — R4189 Other symptoms and signs involving cognitive functions and awareness: Secondary | ICD-10-CM | POA: Diagnosis not present

## 2022-11-06 DIAGNOSIS — I2510 Atherosclerotic heart disease of native coronary artery without angina pectoris: Secondary | ICD-10-CM | POA: Diagnosis present

## 2022-11-06 DIAGNOSIS — G459 Transient cerebral ischemic attack, unspecified: Secondary | ICD-10-CM | POA: Diagnosis not present

## 2022-11-06 DIAGNOSIS — E539 Vitamin B deficiency, unspecified: Secondary | ICD-10-CM | POA: Diagnosis not present

## 2022-11-06 DIAGNOSIS — E512 Wernicke's encephalopathy: Secondary | ICD-10-CM | POA: Diagnosis present

## 2022-11-06 DIAGNOSIS — M1380 Other specified arthritis, unspecified site: Secondary | ICD-10-CM | POA: Diagnosis not present

## 2022-11-06 DIAGNOSIS — I700 Atherosclerosis of aorta: Secondary | ICD-10-CM | POA: Diagnosis present

## 2022-11-06 LAB — BASIC METABOLIC PANEL
Anion gap: 15 (ref 5–15)
BUN: 20 mg/dL (ref 8–23)
CO2: 19 mmol/L — ABNORMAL LOW (ref 22–32)
Calcium: 9 mg/dL (ref 8.9–10.3)
Chloride: 102 mmol/L (ref 98–111)
Creatinine, Ser: 0.62 mg/dL (ref 0.61–1.24)
GFR, Estimated: >60 mL/min (ref >60)
Glucose, Bld: 129 mg/dL — ABNORMAL HIGH (ref 70–99)
Potassium: 3.7 mmol/L (ref 3.5–5.1)
Sodium: 136 mmol/L (ref 135–145)

## 2022-11-06 LAB — CBC
HCT: 50.9 % (ref 39.0–52.0)
Hemoglobin: 16.9 g/dL (ref 13.0–17.0)
MCH: 32.2 pg (ref 26.0–34.0)
MCHC: 33.2 g/dL (ref 30.0–36.0)
MCV: 97 fL (ref 80.0–100.0)
Platelets: 167 10*3/uL (ref 150–400)
RBC: 5.25 MIL/uL (ref 4.22–5.81)
RDW: 12.2 % (ref 11.5–15.5)
WBC: 11.5 10*3/uL — ABNORMAL HIGH (ref 4.0–10.5)
nRBC: 0 % (ref 0.0–0.2)

## 2022-11-06 LAB — SURGICAL PATHOLOGY

## 2022-11-06 LAB — TSH: TSH: 1.811 u[IU]/mL (ref 0.350–4.500)

## 2022-11-06 LAB — AMMONIA: Ammonia: 23 umol/L (ref 9–35)

## 2022-11-06 LAB — MAGNESIUM: Magnesium: 2.3 mg/dL (ref 1.7–2.4)

## 2022-11-06 MED ORDER — FOSFOMYCIN TROMETHAMINE 3 G PO PACK
3.0000 g | PACK | Freq: Once | ORAL | Status: AC
  Administered 2022-11-06: 3 g via ORAL
  Filled 2022-11-06: qty 3

## 2022-11-06 MED ORDER — METOPROLOL TARTRATE 5 MG/5ML IV SOLN: 5.0000 mg | INTRAVENOUS | Status: DC | PRN

## 2022-11-06 MED ORDER — SENNOSIDES-DOCUSATE SODIUM 8.6-50 MG PO TABS
1.0000 | ORAL_TABLET | Freq: Every evening | ORAL | Status: DC | PRN
  Administered 2022-11-11: 1 via ORAL
  Filled 2022-11-06: qty 1

## 2022-11-06 MED ORDER — GUAIFENESIN 100 MG/5ML PO LIQD: 5.0000 mL | ORAL | Status: DC | PRN

## 2022-11-06 MED ORDER — THIAMINE HCL 100 MG/ML IJ SOLN
500.0000 mg | INTRAVENOUS | Status: AC
  Administered 2022-11-06 – 2022-11-10 (×5): 500 mg via INTRAVENOUS
  Filled 2022-11-06 (×5): qty 5

## 2022-11-06 MED ORDER — IPRATROPIUM-ALBUTEROL 0.5-2.5 (3) MG/3ML IN SOLN: 3.0000 mL | RESPIRATORY_TRACT | Status: DC | PRN

## 2022-11-06 MED ORDER — OXYCODONE HCL 5 MG PO TABS: 5.0000 mg | ORAL_TABLET | ORAL | Status: DC | PRN

## 2022-11-06 MED ORDER — ONDANSETRON HCL 4 MG/2ML IJ SOLN: 4.0000 mg | Freq: Four times a day (QID) | INTRAMUSCULAR | Status: DC | PRN

## 2022-11-06 MED ORDER — HYDRALAZINE HCL 20 MG/ML IJ SOLN: 10.0000 mg | INTRAMUSCULAR | Status: DC | PRN

## 2022-11-06 NOTE — Progress Notes (Signed)
Physical therapy notified this nurse that the patient was unable to track her with his eyes and was unable to sit up with out rotating his upper body in a circle. This nurse assessed this patient and notified MD. MD ordered stat MRI.

## 2022-11-06 NOTE — Evaluation (Signed)
Occupational Therapy Evaluation Patient Details Name: Joseph Simpson MRN: [id number] DOB: [birth year] Today's Date: 11/06/2022   History of Present Illness 87 year old male admitted to the hospital with weakness, difficulty standing & walking, and confusion. 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   PMH: HTN, HLD, a fib, CAD, partial gastrectomy and gastrojejunostomy, arthritis, melanoma, OSA, macular degeneration, polyneuropathy   Clinical Impression   Joseph Simpson is currently presenting with impaired ADL performance, impaired functional mobility, impaired sitting balance, and deconditioning. He required max assist for simulated lower body dressing and mod assist for supine to sit. Once seated EOB, he presented with poor sitting balance, demonstrating R sided leaning with ataxic like vs. tremulous state. It was difficult to determine his PLOF, as he appeared significantly hearing impaired and with possible cognitive processing deficits as well. He did however state, he lives alone in a house near his grandson. Without further OT services, he is at risk for progressive weakness and restricted ADL participation.      Recommendations for follow up therapy are one component of a multi-disciplinary discharge planning process, led by the attending physician.  Recommendations may be updated based on patient status, additional functional criteria and insurance authorization.   Follow Up Recommendations  Skilled nursing-short term rehab (<3 hours/day)     Assistance Recommended at Discharge Frequent or constant Supervision/Assistance  Patient can return home with the following A lot of help with walking and/or transfers;A lot of help with bathing/dressing/bathroom;Assistance with cooking/housework;Direct  supervision/assist for medications management;Direct supervision/assist for financial management;Assist for transportation;Help with stairs or ramp for entrance    Functional Status Assessment  Patient has had a recent decline in their functional status and demonstrates the ability to make significant improvements in function in a reasonable and predictable amount of time.  Equipment Recommendations  Other (comment) (to be determined pending progress at next setting)       Precautions / Restrictions Precautions Precautions: Fall Precaution Comments: severely HOH Restrictions Weight Bearing Restrictions: No      Mobility Bed Mobility Overal bed mobility: Needs Assistance Bed Mobility: Supine to Sit, Sit to Supine, Rolling Rolling: Mod assist   Supine to sit: Mod assist Sit to supine: Mod assist          Balance     Sitting balance-Leahy Scale: Poor Sitting balance - Comments: R sided lean with ataxic like vs. tremulous presentation          ADL either performed or assessed with clinical judgement   ADL Overall ADL's : Needs assistance/impaired Eating/Feeding: Minimal assistance Eating/Feeding Details (indicate cue type and reason): He drank water from a cup seated EOB; he needed steadying assist to maintain sitting balance             Upper Body Dressing : Moderate assistance;Bed level   Lower Body Dressing: Maximal assistance;Bed level       Toileting- Clothing Manipulation and Hygiene: Maximal assistance Toileting - Clothing Manipulation Details (indicate cue type and reason): based on clinical judgement             Vision Baseline Vision/History: 1 Wears glasses Additional Comments: he incorrectly read the time depicted on the wall clock on the first attempt, stating the time to be "10 after four."  Once his glasses were placed on his face, he correctly stated the time, which was 2:25  Pertinent Vitals/Pain Pain Assessment Pain  Assessment: No/denies pain     Hand Dominance Right   Extremity/Trunk Assessment Upper Extremity Assessment Upper Extremity Assessment: LUE deficits/detail RUE Deficits / Details: RUE AROM WFL. Functional grip strength LUE Deficits / Details: LUE AROM WFL. Functional grip strength   Lower Extremity Assessment Lower Extremity Assessment:  (BLE AROM WFL)     Communication Communication Communication: HOH   Cognition Arousal/Alertness: Awake/alert   Overall Cognitive Status: Difficult to assess                             Home Living Family/patient expects to be discharged to:: Unsure Living Arrangements: Alone              Prior Functioning/Environment                 ADLs Comments: It was difficult to obtain information regarding the pt's prior level of functioning & living situation, due to his hearing impairment, and possible cognitive processing deficits. He reported he lives alone in a house beside his grandson.        OT Problem List: Decreased strength;Decreased activity tolerance;Impaired balance (sitting and/or standing);Impaired vision/perception;Decreased coordination;Decreased cognition;Decreased knowledge of use of DME or AE      OT Treatment/Interventions: Self-care/ADL training;Therapeutic exercise;Therapeutic activities;Cognitive remediation/compensation;Neuromuscular education;Visual/perceptual remediation/compensation;Energy conservation;DME and/or AE instruction;Patient/family education;Balance training    OT Goals(Current goals can be found in the care plan section) Acute Rehab OT Goals Patient Stated Goal: he did not specifically state OT Goal Formulation: With patient Time For Goal Achievement: 11/20/22 Potential to Achieve Goals: Fair ADL Goals Pt Will Perform Grooming: with set-up;sitting Pt Will Perform Upper Body Dressing: with set-up;sitting Pt Will Perform Lower Body Dressing: with min guard assist;sit to/from stand Pt  Will Transfer to Toilet: with min guard assist;ambulating Pt Will Perform Toileting - Clothing Manipulation and hygiene: with min guard assist;sit to/from stand  OT Frequency: Min 2X/week       AM-PAC OT "6 Clicks" Daily Activity     Outcome Measure Help from another person eating meals?: A Little Help from another person taking care of personal grooming?: A Little Help from another person toileting, which includes using toliet, bedpan, or urinal?: A Lot Help from another person bathing (including washing, rinsing, drying)?: A Lot Help from another person to put on and taking off regular upper body clothing?: A Lot Help from another person to put on and taking off regular lower body clothing?: A Lot 6 Click Score: 14   End of Session Nurse Communication: Mobility status  Activity Tolerance: Other (comment) (Fair overall tolerance) Patient left: in bed;with call bell/phone within reach;with bed alarm set  OT Visit Diagnosis: Muscle weakness (generalized) (M62.81)                Time: 7782-4235 OT Time Calculation (min): 22 min Charges:  OT General Charges $OT Visit: 1 Visit OT Evaluation $OT Eval Moderate Complexity: 1 Mod   Tova Vater L Seabron Iannello, OTR/L 11/06/2022, 3:04 PM

## 2022-11-06 NOTE — Progress Notes (Signed)
Pt placed on contact precautions r/t history of ESBL. Current foley. Will f/u with ID later today

## 2022-11-06 NOTE — Evaluation (Signed)
Physical Therapy Evaluation Patient Details Name: Joseph Simpson MRN: [id number] DOB: December 27, 1934 Today's Date: 11/06/2022  History of Present Illness  87 year old male with history of HTN, HLD, chronic A-fib on Eliquis, CAD, partial gastrectomy and gastrojejunostomy about 40 years ago admitted to the hospital for generalized weakness and nausea vomiting for 1 day.  CT abd/pelvis and head negative for acute pathology, UA negative for infection.  Upper GI showing plaques suspicious for candidiasis. MD ordered MRI due to neuro symptoms, now pending.  Clinical Impression  Patient presents with decreased mobility due to ataxia, decreased strength, decreased safety awareness and decreased body perceptual awareness.  MD aware of concerns and ordering MRI.  Patient poor historian so unaware of prior history.  Currently needing max to +2 A for standing at the bedside.  Pt likely to need STSNF level rehab.  PT will continue to follow.        Recommendations for follow up therapy are one component of a multi-disciplinary discharge planning process, led by the attending physician.  Recommendations may be updated based on patient status, additional functional criteria and insurance authorization.  Follow Up Recommendations Skilled nursing-short term rehab (<3 hours/day) Can patient physically be transported by private vehicle: No    Assistance Recommended at Discharge Frequent or constant Supervision/Assistance  Patient can return home with the following  Two people to help with walking and/or transfers;Assistance with cooking/housework;Direct supervision/assist for medications management;A lot of help with bathing/dressing/bathroom;Assist for transportation;Help with stairs or ramp for entrance    Equipment Recommendations Other (comment) (TBA)  Recommendations for Other Services       Functional Status Assessment Patient has had a recent decline in their functional status and demonstrates the ability  to make significant improvements in function in a reasonable and predictable amount of time.     Precautions / Restrictions Precautions Precautions: Fall Precaution Comments: ataxic, severely HOH, question visual field deficits      Mobility  Bed Mobility Overal bed mobility: Needs Assistance Bed Mobility: Supine to Sit, Sit to Supine     Supine to sit: Mod assist Sit to supine: Max assist   General bed mobility comments: able to move legs off bed, but could not lift trunk cues for using rail and mod lifting help; to supine assist for legs and trunk as pt leaning close to EOB with high fall risk    Transfers Overall transfer level: Needs assistance Equipment used: Rolling walker (2 wheels) Transfers: Sit to/from Stand Sit to Stand: Max assist, From elevated surface           General transfer comment: heavy assist due to anterior lean, wide BOS poor body spatial orientation and pt c/o "slightly dizzy"; stood second time with nursing to check orthostatics with similar presentation and +2 A for safety.    Ambulation/Gait Ambulation/Gait assistance: Max assist Gait Distance (Feet): 0 Feet Assistive device: Rolling walker (2 wheels) Gait Pattern/deviations: Trunk flexed, Wide base of support, Ataxic       General Gait Details: leaning anteriorly, attempted to side step to [facility] and needed heavy A to prevent anterior LOB  Stairs            Wheelchair Mobility    Modified Rankin (Stroke Patients Only) Modified Rankin (Stroke Patients Only) Pre-Morbid Rankin Score: Moderately severe disability (unaure of prior level of function) Modified Rankin: Severe disability     Balance Overall balance assessment: Needs assistance Sitting-balance support: Feet supported Sitting balance-Leahy Scale: Poor Sitting balance - Comments: leaning L and  posterior, min guard to S with pt holding EOB; some ataxia noted as well   Standing balance support: Bilateral upper extremity  supported Standing balance-Leahy Scale: Zero                               Pertinent Vitals/Pain Pain Assessment Pain Assessment: Faces Faces Pain Scale: Hurts a little bit Pain Location: gneralized Pain Descriptors / Indicators: Discomfort Pain Intervention(s): Monitored during session, Repositioned    Home Living Family/patient expects to be discharged to:: Private residence Living Arrangements: Other relatives (per chart grandson)                 Additional Comments: pt is poor historian, reports lives alone and was walking independent    Prior Function                       Hand Dominance        Extremity/Trunk Assessment   Upper Extremity Assessment Upper Extremity Assessment: Generalized weakness;RUE deficits/detail RUE Deficits / Details: RN feels possibly positive for drift    Lower Extremity Assessment Lower Extremity Assessment: Generalized weakness    Cervical / Trunk Assessment Cervical / Trunk Assessment: Kyphotic;Other exceptions Cervical / Trunk Exceptions: cervical stiffness  Communication   Communication: HOH  Cognition Arousal/Alertness: Awake/alert Behavior During Therapy: Flat affect Overall Cognitive Status: Difficult to assess                                 General Comments: confused to location, increased time for one step commands with multimodal cues due to poor hearing,        General Comments General comments (skin integrity, edema, etc.): RN in to assess as concern for stroke, noted BP's increased upon rising so not orthostatic.  Pt in a-fib with HR max 128    Exercises     Assessment/Plan    PT Assessment Patient needs continued PT services  PT Problem List Decreased strength;Decreased cognition;Decreased activity tolerance;Decreased balance;Decreased coordination;Decreased mobility;Cardiopulmonary status limiting activity;Decreased safety awareness       PT Treatment Interventions  DME instruction;Balance training;Gait training;Neuromuscular re-education;Functional mobility training;Patient/family education;Therapeutic activities;Therapeutic exercise    PT Goals (Current goals can be found in the Care Plan section)  Acute Rehab PT Goals Patient Stated Goal: unable to state PT Goal Formulation: Patient unable to participate in goal setting Time For Goal Achievement: 11/20/22 Potential to Achieve Goals: Fair    Frequency Min 3X/week     Co-evaluation               AM-PAC PT "6 Clicks" Mobility  Outcome Measure Help needed turning from your back to your side while in a flat bed without using bedrails?: A Lot Help needed moving from lying on your back to sitting on the side of a flat bed without using bedrails?: Total Help needed moving to and from a bed to a chair (including a wheelchair)?: Total Help needed standing up from a chair using your arms (e.g., wheelchair or bedside chair)?: Total Help needed to walk in hospital room?: Total Help needed climbing 3-5 steps with a railing? : Total 6 Click Score: 7    End of Session Equipment Utilized During Treatment: Gait belt Activity Tolerance: Treatment limited secondary to medical complications (Comment) Patient left: in bed;with call bell/phone within reach;with bed alarm set   PT Visit Diagnosis: Other  abnormalities of gait and mobility (R26.89);Muscle weakness (generalized) (M62.81);Other symptoms and signs involving the nervous system (R29.898);Ataxic gait (R26.0);Dizziness and giddiness (R42)    Time: 1855-0158 PT Time Calculation (min) (ACUTE ONLY): 39 min   Charges:   PT Evaluation $PT Eval Moderate Complexity: 1 Mod PT Treatments $Therapeutic Activity: 23-37 mins        Magda Kiel, PT Acute Rehabilitation Services Office:[phone] 11/06/2022   Reginia Naas 11/06/2022, 12:53 PM

## 2022-11-06 NOTE — Progress Notes (Addendum)
PROGRESS NOTE    Joseph Simpson  ZOX:[id number] DOB: [birth year] DOA: 11/05/2022 PCP: Marin Olp, MD   Brief Narrative:  87 year old male with history of HTN, HLD, chronic A-fib on Eliquis, CAD, partial gastrectomy and gastrojejunostomy about 40 years ago admitted to the hospital for generalized weakness and nausea vomiting for 1 day.  CT of the abdomen pelvis was unremarkable.  Patient undergone GI endoscopy on 12/18 showing esophageal plaques suspicious for candidiasis.  He has had couple of recent ED visits and hospitalization.  CT of the head does not show any acute pathology.  UA does not show any evidence of active infection.   Assessment & Plan:  Principal Problem:   Altered mental status   Altered mental status Dizziness, Orthostatis -Unclear etiology.  Possible delirium.  CT of the head is negative.  No obvious evidence of infection.  Ammonia/TSH= WNL.  Already on B12 injections MRI Brain ordered.   Addednum MRI shows signs of Wenicke encephalopathy, started high dose thiamine.   Nausea with poor appetite and esophagitis - Recent endoscopy in 12/18 negative for Candida on biopsy.  Currently on PPI with Reglan.  Encourage p.o. intake.  Enteroccocus UTI -Fosfomycin ordered  Paroxysmal atrial fibrillation - On metoprolol, Eliquis.  IV as needed  Essential hypertension - On metoprolol and losartan.  IV as needed ordered  Hypokalemia/hypomagnesemia - Replete as necessary  History of prostate hypertrophy with acute urinary retention - Catheter exchanged with outpatient urology follow-up   DVT prophylaxis: Eliquis Code Status: Full code Family Communication:  Spoke with his Son  On going eval for AMS, getting MRI brain   Nutritional status          Body mass index is 21.32 kg/m.         Subjective: Patient seen and examined at bedside.  He is following very basic commands and alert to his name.  Otherwise very dizzy with physical therapy When  I spoke with his son who tells me overall patient is alert awake oriented able to have basic conversation.  His current condition is not his baseline.   Examination:  Constitutional: Not in acute distress but grossly confused Respiratory: Clear to auscultation bilaterally Cardiovascular: Normal sinus rhythm, no rubs Abdomen: Nontender nondistended good bowel sounds Musculoskeletal: No edema noted Skin: No rashes seen Neurologic: Possible left arm drift.  Having hard time tracking.  Grossly moving all the extremities otherwise Psychiatric: Poor judgment and insight  Objective: Vitals:   11/05/22 2002 11/06/22 0152 11/06/22 0410 11/06/22 0505  BP:  138/77  (!) 155/82  Pulse:  (!) 58  95  Resp:  16  18  Temp:  97.7 F (36.5 C)  98 F (36.7 C)  TempSrc:  Oral  Oral  SpO2:  100%  95%  Weight: 72.6 kg  73.3 kg   Height: '6\' 1"'$  (1.854 m)       Intake/Output Summary (Last 24 hours) at 11/06/2022 0855 Last data filed at 11/06/2022 0500 Gross per 24 hour  Intake 1961.66 ml  Output 1508 ml  Net 453.66 ml   Filed Weights   11/05/22 2002 11/06/22 0410  Weight: 72.6 kg 73.3 kg     Data Reviewed:   CBC: Recent Labs  Lab 11/05/22 1237 11/06/22 0545  WBC 12.3* 11.5*  NEUTROABS 10.8*  --   HGB 17.3* 16.9  HCT 51.1 50.9  MCV 95.0 97.0  PLT 149* 811   Basic Metabolic Panel: Recent Labs  Lab 11/05/22 1237 11/06/22 0545  NA 139  136  K 3.3* 3.7  CL 107 102  CO2 20* 19*  GLUCOSE 160* 129*  BUN 27* 20  CREATININE 0.63 0.62  CALCIUM 8.2* 9.0  MG  --  2.3   GFR: Estimated Creatinine Clearance: 70 mL/min (by C-G formula based on SCr of 0.62 mg/dL). Liver Function Tests: Recent Labs  Lab 11/05/22 1237  AST 20  ALT 23  ALKPHOS 49  BILITOT 1.3*  PROT 6.1*  ALBUMIN 3.2*   Recent Labs  Lab 11/05/22 1237  LIPASE 47   No results for input(s): "AMMONIA" in the last 168 hours. Coagulation Profile: No results for input(s): "INR", "PROTIME" in the last 168  hours. Cardiac Enzymes: No results for input(s): "CKTOTAL", "CKMB", "CKMBINDEX", "TROPONINI" in the last 168 hours. BNP (last 3 results) No results for input(s): "PROBNP" in the last 8760 hours. HbA1C: No results for input(s): "HGBA1C" in the last 72 hours. CBG: No results for input(s): "GLUCAP" in the last 168 hours. Lipid Profile: No results for input(s): "CHOL", "HDL", "LDLCALC", "TRIG", "CHOLHDL", "LDLDIRECT" in the last 72 hours. Thyroid Function Tests: No results for input(s): "TSH", "T4TOTAL", "FREET4", "T3FREE", "THYROIDAB" in the last 72 hours. Anemia Panel: No results for input(s): "VITAMINB12", "FOLATE", "FERRITIN", "TIBC", "IRON", "RETICCTPCT" in the last 72 hours. Sepsis Labs: No results for input(s): "PROCALCITON", "LATICACIDVEN" in the last 168 hours.  Recent Results (from the past 240 hour(s))  Resp panel by RT-PCR (RSV, Flu A&B, Covid) Anterior Nasal Swab     Status: None   Collection Time: 10/27/22  2:36 PM   Specimen: Anterior Nasal Swab  Result Value Ref Range Status   SARS Coronavirus 2 by RT PCR NEGATIVE NEGATIVE Final    Comment: (NOTE) SARS-CoV-2 target nucleic acids are NOT DETECTED.  The SARS-CoV-2 RNA is generally detectable in upper respiratory specimens during the acute phase of infection. The lowest concentration of SARS-CoV-2 viral copies this assay can detect is 138 copies/mL. A negative result does not preclude SARS-Cov-2 infection and should not be used as the sole basis for treatment or other patient management decisions. A negative result may occur with  improper specimen collection/handling, submission of specimen other than nasopharyngeal swab, presence of viral mutation(s) within the areas targeted by this assay, and inadequate number of viral copies(<138 copies/mL). A negative result must be combined with clinical observations, patient history, and epidemiological information. The expected result is Negative.  Fact Sheet for Patients:   EntrepreneurPulse.com.au  Fact Sheet for Healthcare Providers:  IncredibleEmployment.be  This test is no t yet approved or cleared by the [place] FDA and  has been authorized for detection and/or diagnosis of SARS-CoV-2 by FDA under an Emergency Use Authorization (EUA). This EUA will remain  in effect (meaning this test can be used) for the duration of the COVID-19 declaration under Section 564(b)(1) of the Act, 21 U.S.C.section 360bbb-3(b)(1), unless the authorization is terminated  or revoked sooner.       Influenza A by PCR NEGATIVE NEGATIVE Final   Influenza B by PCR NEGATIVE NEGATIVE Final    Comment: (NOTE) The Xpert Xpress SARS-CoV-2/FLU/RSV plus assay is intended as an aid in the diagnosis of influenza from Nasopharyngeal swab specimens and should not be used as a sole basis for treatment. Nasal washings and aspirates are unacceptable for Xpert Xpress SARS-CoV-2/FLU/RSV testing.  Fact Sheet for Patients: EntrepreneurPulse.com.au  Fact Sheet for Healthcare Providers: IncredibleEmployment.be  This test is not yet approved or cleared by the [place] FDA and has been authorized for detection and/or  diagnosis of SARS-CoV-2 by FDA under an Emergency Use Authorization (EUA). This EUA will remain in effect (meaning this test can be used) for the duration of the COVID-19 declaration under Section 564(b)(1) of the Act, 21 U.S.C. section 360bbb-3(b)(1), unless the authorization is terminated or revoked.     Resp Syncytial Virus by PCR NEGATIVE NEGATIVE Final    Comment: (NOTE) Fact Sheet for Patients: EntrepreneurPulse.com.au  Fact Sheet for Healthcare Providers: IncredibleEmployment.be  This test is not yet approved or cleared by the [place] FDA and has been authorized for detection and/or diagnosis of SARS-CoV-2 by FDA under an Emergency Use  Authorization (EUA). This EUA will remain in effect (meaning this test can be used) for the duration of the COVID-19 declaration under Section 564(b)(1) of the Act, 21 U.S.C. section 360bbb-3(b)(1), unless the authorization is terminated or revoked.  Performed at [facility], [place] [address] [place], [place] [address]          Radiology Studies: CT ABDOMEN PELVIS W CONTRAST  Result Date: 11/05/2022 CLINICAL DATA:  Epigastric pain EXAM: CT ABDOMEN AND PELVIS WITH CONTRAST TECHNIQUE: Multidetector CT imaging of the abdomen and pelvis was performed using the standard protocol following bolus administration of intravenous contrast. RADIATION DOSE REDUCTION: This exam was performed according to the departmental dose-optimization program which includes automated exposure control, adjustment of the mA and/or kV according to patient size and/or use of iterative reconstruction technique. CONTRAST:  19m OMNIPAQUE IOHEXOL 300 MG/ML  SOLN COMPARISON:  CT 10/29/2022, 10/27/2022 FINDINGS: Lower chest: Lung bases demonstrate no acute consolidation or effusion. Mild lower lobe bronchial wall thickening and subpleural reticulation. Hepatobiliary: No focal liver abnormality is seen. No gallstones, gallbladder wall thickening, or biliary dilatation. Pancreas: Unremarkable. No pancreatic ductal dilatation or surrounding inflammatory changes. Spleen: Normal in size without focal abnormality. Adrenals/Urinary Tract: Adrenal glands are normal. Kidneys show no hydronephrosis. The previously noted right UVJ stone is no longer visualized no bladder assessment is limited by artifact from hip hardware. Catheter in the bladder. Thick-walled appearance of the bladder. Cortical scarring right kidney. Small nonobstructing stones at the lower pole right kidney. Stomach/Bowel: Stomach is moderately distended with fluid. Status post partial gastrectomy with left abdominal gastro jejunostomy. Remainder of small  bowel is nondilated. There is no acute bowel wall thickening. 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 Vascular/Lymphatic: Moderate aortic atherosclerosis. No aneurysm. No suspicious lymph nodes. Reproductive: Enlarged prostate Other: Negative for pelvic effusion or free air. Musculoskeletal: Left hip replacement with artifact. No acute osseous abnormality. IMPRESSION: 1. Status post partial gastrectomy with gastrojejunostomy. There is moderate fluid distension of the stomach, if outlet obstruction is suspected, correlation with UGI could be considered. Small bowel is otherwise nondistended and there is no evidence for small bowel obstruction. 2. Thick-walled urinary bladder which is decompressed by Foley catheter. Previously noted right UVJ stone is no longer visualized. Nonobstructing right kidney stones. 3. Diverticular disease of the colon without acute wall thickening Aortic Atherosclerosis (ICD10-I70.0). Electronically Signed   By: KDonavan FoilM.D.   On: 11/05/2022 16:33   CT Head Wo Contrast  Result Date: 11/05/2022 CLINICAL DATA:  Altered mental status EXAM: CT HEAD WITHOUT CONTRAST TECHNIQUE: Contiguous axial images were obtained from the base of the skull through the vertex without intravenous contrast. RADIATION DOSE REDUCTION: This exam was performed according to the departmental dose-optimization program which includes automated exposure control, adjustment of the mA and/or kV according to patient size and/or use of iterative reconstruction technique. COMPARISON:  07/23/2011 FINDINGS:  Brain: No acute intracranial findings are seen. There are no signs of bleeding within the cranium. Cortical sulci are prominent. There is decreased density in periventricular white matter. There is no focal edema or mass effect. Vascular: Scattered arterial calcifications are seen. Skull: Unremarkable. Sinuses/Orbits: Unremarkable. Other: None. IMPRESSION: No acute intracranial findings are seen in noncontrast CT  brain. Atrophy. Small-vessel disease. Electronically Signed   By: Elmer Picker M.D.   On: 11/05/2022 16:20        Scheduled Meds:  apixaban  5 mg Oral BID   atorvastatin  20 mg Oral Q M,W,F-2000   docusate sodium  200 mg Oral QHS   losartan  25 mg Oral Daily   metoCLOPramide  5 mg Oral TID AC   metoprolol tartrate  12.5 mg Oral BID   pantoprazole  40 mg Oral BID   polyvinyl alcohol  1 drop Both Eyes QHS   potassium chloride  20 mEq Oral BID   tamsulosin  0.4 mg Oral Daily   Continuous Infusions:  lactated ringers 100 mL/hr at 11/06/22 0140     LOS: 0 days   Time spent= 35 mins    Kapil Petropoulos Arsenio Loader, MD [facility]  If 7PM-7AM, please contact night-coverage  11/06/2022, 8:55 AM

## 2022-11-07 DIAGNOSIS — R4182 Altered mental status, unspecified: Secondary | ICD-10-CM | POA: Diagnosis not present

## 2022-11-07 LAB — CBC
HCT: 41.5 % (ref 39.0–52.0)
Hemoglobin: 14 g/dL (ref 13.0–17.0)
MCH: 32.6 pg (ref 26.0–34.0)
MCHC: 33.7 g/dL (ref 30.0–36.0)
MCV: 96.5 fL (ref 80.0–100.0)
Platelets: 113 10*3/uL — ABNORMAL LOW (ref 150–400)
RBC: 4.3 MIL/uL (ref 4.22–5.81)
RDW: 12.2 % (ref 11.5–15.5)
WBC: 11.8 10*3/uL — ABNORMAL HIGH (ref 4.0–10.5)
nRBC: 0 % (ref 0.0–0.2)

## 2022-11-07 LAB — URINE CULTURE: Culture: 60000 — AB

## 2022-11-07 LAB — BASIC METABOLIC PANEL
Anion gap: 7 (ref 5–15)
BUN: 27 mg/dL — ABNORMAL HIGH (ref 8–23)
CO2: 27 mmol/L (ref 22–32)
Calcium: 8.7 mg/dL — ABNORMAL LOW (ref 8.9–10.3)
Chloride: 103 mmol/L (ref 98–111)
Creatinine, Ser: 0.92 mg/dL (ref 0.61–1.24)
GFR, Estimated: >60 mL/min (ref >60)
Glucose, Bld: 88 mg/dL (ref 70–99)
Potassium: 3.8 mmol/L (ref 3.5–5.1)
Sodium: 137 mmol/L (ref 135–145)

## 2022-11-07 LAB — MAGNESIUM: Magnesium: 2.5 mg/dL — ABNORMAL HIGH (ref 1.7–2.4)

## 2022-11-07 MED ORDER — ORAL CARE MOUTH RINSE: 15.0000 mL | OROMUCOSAL | Status: DC | PRN

## 2022-11-07 MED ORDER — CHLORHEXIDINE GLUCONATE CLOTH 2 % EX PADS
6.0000 | MEDICATED_PAD | Freq: Every day | CUTANEOUS | Status: DC
  Administered 2022-11-07 – 2022-11-10 (×4): 6 via TOPICAL

## 2022-11-07 NOTE — Progress Notes (Signed)
PROGRESS NOTE    Joseph Simpson  GUR:[id number] DOB: 1935-11-01 DOA: 11/05/2022 PCP: Marin Olp, MD   Brief Narrative:  87 year old male with history of HTN, HLD, chronic A-fib on Eliquis, CAD, partial gastrectomy and gastrojejunostomy about 40 years ago admitted to the hospital for generalized weakness and nausea vomiting for 1 day.  CT of the abdomen pelvis was unremarkable.  Patient undergone GI endoscopy on 12/18 showing esophageal plaques suspicious for candidiasis.  He has had couple of recent ED visits and hospitalization.  CT of the head does not show any acute pathology.  UA does not show any evidence of active infection.   Assessment & Plan:  Principal Problem:   Altered mental status Active Problems:   Delirium   Altered mental status; improved Wernicke encephalopathy.  Dizziness, Orthostatis -Unclear etiology.  Possible delirium.  CT of the head is negative.  No obvious evidence of infection.  Ammonia/TSH= WNL.  Already on B12 injections MRI Brain showed Wernicke encephalopathy. Started high dose thiamine '500mg'$  IV  Nausea with poor appetite and esophagitis - Recent endoscopy in 12/18 negative for Candida on biopsy.  Currently on PPI with Reglan.  Encourage p.o. intake.  Enteroccocus UTI -Fosfomycin s/p 1 dose on 12/22  Paroxysmal atrial fibrillation - On metoprolol, Eliquis.  IV as needed  Essential hypertension - On metoprolol and losartan.  IV as needed ordered  Hypokalemia/hypomagnesemia - Replete as necessary  History of prostate hypertrophy with acute urinary retention - Catheter exchanged with outpatient urology follow-up   DVT prophylaxis: Eliquis Code Status: Full code Family Communication:  Spoke with his Son at bedside.   Mentation is better, still need therapy and IV thiamin   Nutritional status          Body mass index is 21.15 kg/m.         Subjective: Son at bedside.  Patient is better. This morning he is AAOx3. No  complaints.    Examination:  Constitutional: Not in acute distress Respiratory: Clear to auscultation bilaterally Cardiovascular: Normal sinus rhythm, no rubs Abdomen: Nontender nondistended good bowel sounds Musculoskeletal: No edema noted Skin: No rashes seen Neurologic: CN 2-12 grossly intact.  And nonfocal Psychiatric: Normal judgment and insight. Alert and oriented x 3. Normal mood.    Objective: Vitals:   11/06/22 1437 11/06/22 1951 11/07/22 0441 11/07/22 0459  BP: 116/74 (!) 98/44 124/61   Pulse: (!) 104 65 60   Resp:  16 18   Temp: (!) 97.4 F (36.3 C) 97.8 F (36.6 C) 97.7 F (36.5 C)   TempSrc: Oral Oral Oral   SpO2: 93% 96% 100%   Weight:    72.7 kg  Height:        Intake/Output Summary (Last 24 hours) at 11/07/2022 1158 Last data filed at 11/07/2022 0500 Gross per 24 hour  Intake 1914.39 ml  Output 600 ml  Net 1314.39 ml   Filed Weights   11/05/22 2002 11/06/22 0410 11/07/22 0459  Weight: 72.6 kg 73.3 kg 72.7 kg     Data Reviewed:   CBC: Recent Labs  Lab 11/05/22 1237 11/06/22 0545 11/07/22 0731  WBC 12.3* 11.5* 11.8*  NEUTROABS 10.8*  --   --   HGB 17.3* 16.9 14.0  HCT 51.1 50.9 41.5  MCV 95.0 97.0 96.5  PLT 149* 167 283*   Basic Metabolic Panel: Recent Labs  Lab 11/05/22 1237 11/06/22 0545 11/07/22 0731  NA 139 136 137  K 3.3* 3.7 3.8  CL 107 102 103  CO2  20* 19* 27  GLUCOSE 160* 129* 88  BUN 27* 20 27*  CREATININE 0.63 0.62 0.92  CALCIUM 8.2* 9.0 8.7*  MG  --  2.3 2.5*   GFR: Estimated Creatinine Clearance: 60.4 mL/min (by C-G formula based on SCr of 0.92 mg/dL). Liver Function Tests: Recent Labs  Lab 11/05/22 1237  AST 20  ALT 23  ALKPHOS 49  BILITOT 1.3*  PROT 6.1*  ALBUMIN 3.2*   Recent Labs  Lab 11/05/22 1237  LIPASE 47   Recent Labs  Lab 11/06/22 0912  AMMONIA 23   Coagulation Profile: No results for input(s): "INR", "PROTIME" in the last 168 hours. Cardiac Enzymes: No results for input(s):  "CKTOTAL", "CKMB", "CKMBINDEX", "TROPONINI" in the last 168 hours. BNP (last 3 results) No results for input(s): "PROBNP" in the last 8760 hours. HbA1C: No results for input(s): "HGBA1C" in the last 72 hours. CBG: No results for input(s): "GLUCAP" in the last 168 hours. Lipid Profile: No results for input(s): "CHOL", "HDL", "LDLCALC", "TRIG", "CHOLHDL", "LDLDIRECT" in the last 72 hours. Thyroid Function Tests: Recent Labs    11/06/22 0912  TSH 1.811   Anemia Panel: No results for input(s): "VITAMINB12", "FOLATE", "FERRITIN", "TIBC", "IRON", "RETICCTPCT" in the last 72 hours. Sepsis Labs: No results for input(s): "PROCALCITON", "LATICACIDVEN" in the last 168 hours.  Recent Results (from the past 240 hour(s))  Urine Culture     Status: Abnormal   Collection Time: 11/05/22  2:56 PM   Specimen: Urine, Clean Catch  Result Value Ref Range Status   Specimen Description   Final    URINE, CLEAN CATCH Performed at [facility], [place] [address] [place], [place] [address]    Special Requests   Final    NONE Performed at [facility], [place] [address], [place], [place] [address]    Culture 60,000 COLONIES/mL ENTEROCOCCUS FAECALIS (A)  Final   Report Status 11/07/2022 FINAL  Final   Organism ID, Bacteria ENTEROCOCCUS FAECALIS (A)  Final      Susceptibility   Enterococcus faecalis - MIC*    AMPICILLIN <=2 SENSITIVE Sensitive     NITROFURANTOIN <=16 SENSITIVE Sensitive     VANCOMYCIN 1 SENSITIVE Sensitive     * 60,000 COLONIES/mL ENTEROCOCCUS FAECALIS         Radiology Studies: MR BRAIN WO CONTRAST  Result Date: 11/06/2022 CLINICAL DATA:  Transient ischemic attack (TIA) EXAM: MRI HEAD WITHOUT CONTRAST TECHNIQUE: Multiplanar, multiecho pulse sequences of the brain and surrounding structures were obtained without intravenous contrast. COMPARISON:  CT head 11/05/2022. FINDINGS: Brain: Abnormal/symmetric T2/FLAIR hyperintensity involving the  dorsomedial thalami, mamillary bodies, periaqueductal gray, and tectal plate. No evidence of acute infarct no acute hemorrhage, mass lesion, midline shift or hydrocephalus. Vascular: Major arterial flow voids are maintained at the skull base. Skull and upper cervical spine: Normal marrow signal. Sinuses/Orbits: Clear sinuses.  No acute orbital findings. Other: No mastoid effusions. IMPRESSION: Abnormal/symmetric T2/FLAIR hyperintensity involving the dorsomedial thalami, mamillary bodies, periaqueductal gray, and tectal plate, compatible with Wernicke encephalopathy (alcoholic encephalopathy). Electronically Signed   By: Margaretha Sheffield M.D.   On: 11/06/2022 13:15   CT ABDOMEN PELVIS W CONTRAST  Result Date: 11/05/2022 CLINICAL DATA:  Epigastric pain EXAM: CT ABDOMEN AND PELVIS WITH CONTRAST TECHNIQUE: Multidetector CT imaging of the abdomen and pelvis was performed using the standard protocol following bolus administration of intravenous contrast. RADIATION DOSE REDUCTION: This exam was performed according to the departmental dose-optimization program which includes automated exposure control, adjustment of the mA  and/or kV according to patient size and/or use of iterative reconstruction technique. CONTRAST:  134m OMNIPAQUE IOHEXOL 300 MG/ML  SOLN COMPARISON:  CT 10/29/2022, 10/27/2022 FINDINGS: Lower chest: Lung bases demonstrate no acute consolidation or effusion. Mild lower lobe bronchial wall thickening and subpleural reticulation. Hepatobiliary: No focal liver abnormality is seen. No gallstones, gallbladder wall thickening, or biliary dilatation. Pancreas: Unremarkable. No pancreatic ductal dilatation or surrounding inflammatory changes. Spleen: Normal in size without focal abnormality. Adrenals/Urinary Tract: Adrenal glands are normal. Kidneys show no hydronephrosis. The previously noted right UVJ stone is no longer visualized no bladder assessment is limited by artifact from hip hardware. Catheter in  the bladder. Thick-walled appearance of the bladder. Cortical scarring right kidney. Small nonobstructing stones at the lower pole right kidney. Stomach/Bowel: Stomach is moderately distended with fluid. Status post partial gastrectomy with left abdominal gastro jejunostomy. Remainder of small bowel is nondilated. There is no acute bowel wall thickening. 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 Vascular/Lymphatic: Moderate aortic atherosclerosis. No aneurysm. No suspicious lymph nodes. Reproductive: Enlarged prostate Other: Negative for pelvic effusion or free air. Musculoskeletal: Left hip replacement with artifact. No acute osseous abnormality. IMPRESSION: 1. Status post partial gastrectomy with gastrojejunostomy. There is moderate fluid distension of the stomach, if outlet obstruction is suspected, correlation with UGI could be considered. Small bowel is otherwise nondistended and there is no evidence for small bowel obstruction. 2. Thick-walled urinary bladder which is decompressed by Foley catheter. Previously noted right UVJ stone is no longer visualized. Nonobstructing right kidney stones. 3. Diverticular disease of the colon without acute wall thickening Aortic Atherosclerosis (ICD10-I70.0). Electronically Signed   By: KDonavan FoilM.D.   On: 11/05/2022 16:33   CT Head Wo Contrast  Result Date: 11/05/2022 CLINICAL DATA:  Altered mental status EXAM: CT HEAD WITHOUT CONTRAST TECHNIQUE: Contiguous axial images were obtained from the base of the skull through the vertex without intravenous contrast. RADIATION DOSE REDUCTION: This exam was performed according to the departmental dose-optimization program which includes automated exposure control, adjustment of the mA and/or kV according to patient size and/or use of iterative reconstruction technique. COMPARISON:  07/23/2011 FINDINGS: Brain: No acute intracranial findings are seen. There are no signs of bleeding within the cranium. Cortical sulci are  prominent. There is decreased density in periventricular white matter. There is no focal edema or mass effect. Vascular: Scattered arterial calcifications are seen. Skull: Unremarkable. Sinuses/Orbits: Unremarkable. Other: None. IMPRESSION: No acute intracranial findings are seen in noncontrast CT brain. Atrophy. Small-vessel disease. Electronically Signed   By: PElmer PickerM.D.   On: 11/05/2022 16:20        Scheduled Meds:  apixaban  5 mg Oral BID   atorvastatin  20 mg Oral Q M,W,F-2000   docusate sodium  200 mg Oral QHS   losartan  25 mg Oral Daily   metoCLOPramide  5 mg Oral TID AC   metoprolol tartrate  12.5 mg Oral BID   pantoprazole  40 mg Oral BID   polyvinyl alcohol  1 drop Both Eyes QHS   tamsulosin  0.4 mg Oral Daily   Continuous Infusions:  lactated ringers 100 mL/hr at 11/07/22 0318   thiamine (VITAMIN B1) injection Stopped (11/06/22 1627)     LOS: 1 day   Time spent= 35 mins    Princesa Willig CArsenio Loader MD [facility]  If 7PM-7AM, please contact night-coverage  11/07/2022, 11:58 AM

## 2022-11-08 DIAGNOSIS — R4182 Altered mental status, unspecified: Secondary | ICD-10-CM | POA: Diagnosis not present

## 2022-11-08 LAB — BASIC METABOLIC PANEL
Anion gap: 5 (ref 5–15)
BUN: 24 mg/dL — ABNORMAL HIGH (ref 8–23)
CO2: 24 mmol/L (ref 22–32)
Calcium: 8.4 mg/dL — ABNORMAL LOW (ref 8.9–10.3)
Chloride: 107 mmol/L (ref 98–111)
Creatinine, Ser: 0.68 mg/dL (ref 0.61–1.24)
GFR, Estimated: >60 mL/min (ref >60)
Glucose, Bld: 93 mg/dL (ref 70–99)
Potassium: 3.8 mmol/L (ref 3.5–5.1)
Sodium: 136 mmol/L (ref 135–145)

## 2022-11-08 LAB — CBC
HCT: 39.2 % (ref 39.0–52.0)
Hemoglobin: 13.2 g/dL (ref 13.0–17.0)
MCH: 31.9 pg (ref 26.0–34.0)
MCHC: 33.7 g/dL (ref 30.0–36.0)
MCV: 94.7 fL (ref 80.0–100.0)
Platelets: 108 10*3/uL — ABNORMAL LOW (ref 150–400)
RBC: 4.14 MIL/uL — ABNORMAL LOW (ref 4.22–5.81)
RDW: 12 % (ref 11.5–15.5)
WBC: 7.7 10*3/uL (ref 4.0–10.5)
nRBC: 0 % (ref 0.0–0.2)

## 2022-11-08 LAB — MAGNESIUM: Magnesium: 2.4 mg/dL (ref 1.7–2.4)

## 2022-11-08 MED ORDER — POLYETHYLENE GLYCOL 3350 17 G PO PACK
17.0000 g | PACK | Freq: Every day | ORAL | Status: DC
  Administered 2022-11-08 – 2022-11-11 (×4): 17 g via ORAL
  Filled 2022-11-08 (×4): qty 1

## 2022-11-08 MED ORDER — SODIUM CHLORIDE 0.9 % IV BOLUS
500.0000 mL | Freq: Once | INTRAVENOUS | Status: AC
  Administered 2022-11-08: 500 mL via INTRAVENOUS

## 2022-11-08 NOTE — Progress Notes (Signed)
PROGRESS NOTE    Joseph Simpson  ATF:[id number] DOB: 07-11-1935 DOA: 11/05/2022 PCP: Marin Olp, MD   Brief Narrative:  87 year old male with history of HTN, HLD, chronic A-fib on Eliquis, CAD, partial gastrectomy and gastrojejunostomy about 40 years ago admitted to the hospital for generalized weakness and nausea vomiting for 1 day.  CT of the abdomen pelvis was unremarkable.  Patient undergone GI endoscopy on 12/18 showing esophageal plaques suspicious for candidiasis.  He has had couple of recent ED visits and hospitalization.  CT of the head does not show any acute pathology.  UA does not show any evidence of active infection but eventually cultures grew Enterococcus therefore received 1 dose of fosfomycin on 12/22.  Patient was also started on thiamine injection due to [place] encephalopathy.  Assessment & Plan:  Principal Problem:   Altered mental status Active Problems:   Delirium   Altered mental status; improved Wernicke encephalopathy.  Dizziness, Orthostatis -Unclear etiology.  Possible delirium.  CT of the head is negative.  No obvious evidence of infection.  Ammonia/TSH= WNL.  Already on B12 injections MRI Brain showed Wernicke encephalopathy.  Day 3 thiamine 5 mg IV.  Nausea with poor appetite and esophagitis - Recent endoscopy in 12/18 negative for Candida on biopsy.  Currently on PPI with Reglan.  Encourage p.o. intake.  Enteroccocus UTI -Fosfomycin s/p 1 dose on 12/22  Paroxysmal atrial fibrillation - On metoprolol, Eliquis.  IV as needed  Essential hypertension - On metoprolol and losartan.  IV as needed ordered  Hypokalemia/hypomagnesemia - Replete as necessary  History of prostate hypertrophy with acute urinary retention - Catheter exchanged with outpatient urology follow-up   DVT prophylaxis: Eliquis Code Status: Full code Family Communication:  Spoke with his Son at bedside.   Mentation better, SNF placement   Nutritional status           Body mass index is 21.15 kg/m.         Subjective: Patient is doing better, no complaints  Examination: Constitutional: Not in acute distress Respiratory: Clear to auscultation bilaterally Cardiovascular: Normal sinus rhythm, no rubs Abdomen: Nontender nondistended good bowel sounds Musculoskeletal: No edema noted Skin: No rashes seen Neurologic: CN 2-12 grossly intact.  And nonfocal Psychiatric: Normal judgment and insight. Alert and oriented x 3. Normal mood. Objective: Vitals:   11/07/22 0459 11/07/22 1432 11/07/22 2025 11/08/22 0531  BP:  (!) 92/45 (!) 99/56 (!) 114/56  Pulse:  67 63 63  Resp:  '18 18 18  '$ Temp:  97.9 F (36.6 C) 98.3 F (36.8 C) 98.3 F (36.8 C)  TempSrc:  Oral Oral Oral  SpO2:  99% 96% 97%  Weight: 72.7 kg     Height:        Intake/Output Summary (Last 24 hours) at 11/08/2022 1033 Last data filed at 11/08/2022 0546 Gross per 24 hour  Intake 3035.52 ml  Output 650 ml  Net 2385.52 ml   Filed Weights   11/05/22 2002 11/06/22 0410 11/07/22 0459  Weight: 72.6 kg 73.3 kg 72.7 kg     Data Reviewed:   CBC: Recent Labs  Lab 11/05/22 1237 11/06/22 0545 11/07/22 0731 11/08/22 0653  WBC 12.3* 11.5* 11.8* 7.7  NEUTROABS 10.8*  --   --   --   HGB 17.3* 16.9 14.0 13.2  HCT 51.1 50.9 41.5 39.2  MCV 95.0 97.0 96.5 94.7  PLT 149* 167 113* 270*   Basic Metabolic Panel: Recent Labs  Lab 11/05/22 1237 11/06/22 0545 11/07/22 0731 11/08/22  0653  NA 139 136 137 136  K 3.3* 3.7 3.8 3.8  CL 107 102 103 107  CO2 20* 19* 27 24  GLUCOSE 160* 129* 88 93  BUN 27* 20 27* 24*  CREATININE 0.63 0.62 0.92 0.68  CALCIUM 8.2* 9.0 8.7* 8.4*  MG  --  2.3 2.5* 2.4   GFR: Estimated Creatinine Clearance: 69.4 mL/min (by C-G formula based on SCr of 0.68 mg/dL). Liver Function Tests: Recent Labs  Lab 11/05/22 1237  AST 20  ALT 23  ALKPHOS 49  BILITOT 1.3*  PROT 6.1*  ALBUMIN 3.2*   Recent Labs  Lab 11/05/22 1237  LIPASE 47   Recent  Labs  Lab 11/06/22 0912  AMMONIA 23   Coagulation Profile: No results for input(s): "INR", "PROTIME" in the last 168 hours. Cardiac Enzymes: No results for input(s): "CKTOTAL", "CKMB", "CKMBINDEX", "TROPONINI" in the last 168 hours. BNP (last 3 results) No results for input(s): "PROBNP" in the last 8760 hours. HbA1C: No results for input(s): "HGBA1C" in the last 72 hours. CBG: No results for input(s): "GLUCAP" in the last 168 hours. Lipid Profile: No results for input(s): "CHOL", "HDL", "LDLCALC", "TRIG", "CHOLHDL", "LDLDIRECT" in the last 72 hours. Thyroid Function Tests: Recent Labs    11/06/22 0912  TSH 1.811   Anemia Panel: No results for input(s): "VITAMINB12", "FOLATE", "FERRITIN", "TIBC", "IRON", "RETICCTPCT" in the last 72 hours. Sepsis Labs: No results for input(s): "PROCALCITON", "LATICACIDVEN" in the last 168 hours.  Recent Results (from the past 240 hour(s))  Urine Culture     Status: Abnormal   Collection Time: 11/05/22  2:56 PM   Specimen: Urine, Clean Catch  Result Value Ref Range Status   Specimen Description   Final    URINE, CLEAN CATCH Performed at [facility], [place] [address] [place], [place] [address]    Special Requests   Final    NONE Performed at [facility], [place] [address] [place], [place] [address]    Culture 60,000 COLONIES/mL ENTEROCOCCUS FAECALIS (A)  Final   Report Status 11/07/2022 FINAL  Final   Organism ID, Bacteria ENTEROCOCCUS FAECALIS (A)  Final      Susceptibility   Enterococcus faecalis - MIC*    AMPICILLIN <=2 SENSITIVE Sensitive     NITROFURANTOIN <=16 SENSITIVE Sensitive     VANCOMYCIN 1 SENSITIVE Sensitive     * 60,000 COLONIES/mL ENTEROCOCCUS FAECALIS         Radiology Studies: MR BRAIN WO CONTRAST  Result Date: 11/06/2022 CLINICAL DATA:  Transient ischemic attack (TIA) EXAM: MRI HEAD WITHOUT CONTRAST TECHNIQUE: Multiplanar, multiecho pulse sequences of the brain and  surrounding structures were obtained without intravenous contrast. COMPARISON:  CT head 11/05/2022. FINDINGS: Brain: Abnormal/symmetric T2/FLAIR hyperintensity involving the dorsomedial thalami, mamillary bodies, periaqueductal gray, and tectal plate. No evidence of acute infarct no acute hemorrhage, mass lesion, midline shift or hydrocephalus. Vascular: Major arterial flow voids are maintained at the skull base. Skull and upper cervical spine: Normal marrow signal. Sinuses/Orbits: Clear sinuses.  No acute orbital findings. Other: No mastoid effusions. IMPRESSION: Abnormal/symmetric T2/FLAIR hyperintensity involving the dorsomedial thalami, mamillary bodies, periaqueductal gray, and tectal plate, compatible with Wernicke encephalopathy (alcoholic encephalopathy). Electronically Signed   By: Margaretha Sheffield M.D.   On: 11/06/2022 13:15        Scheduled Meds:  apixaban  5 mg Oral BID   atorvastatin  20 mg Oral Q M,W,F-2000   Chlorhexidine Gluconate Cloth  6 each Topical Daily   docusate sodium  200 mg Oral QHS   losartan  25 mg Oral Daily   metoCLOPramide  5 mg Oral TID AC   metoprolol tartrate  12.5 mg Oral BID   pantoprazole  40 mg Oral BID   polyvinyl alcohol  1 drop Both Eyes QHS   tamsulosin  0.4 mg Oral Daily   Continuous Infusions:  lactated ringers 100 mL/hr at 11/08/22 1016   thiamine (VITAMIN B1) injection 100 mL/hr at 11/08/22 0024     LOS: 2 days   Time spent= 35 mins    Temiloluwa Recchia Arsenio Loader, MD [facility]  If 7PM-7AM, please contact night-coverage  11/08/2022, 10:33 AM

## 2022-11-09 ENCOUNTER — Encounter (HOSPITAL_COMMUNITY): Payer: Self-pay | Admitting: Gastroenterology

## 2022-11-09 DIAGNOSIS — R4182 Altered mental status, unspecified: Secondary | ICD-10-CM | POA: Diagnosis not present

## 2022-11-09 LAB — BASIC METABOLIC PANEL
Anion gap: 8 (ref 5–15)
BUN: 13 mg/dL (ref 8–23)
CO2: 25 mmol/L (ref 22–32)
Calcium: 8.5 mg/dL — ABNORMAL LOW (ref 8.9–10.3)
Chloride: 105 mmol/L (ref 98–111)
Creatinine, Ser: 0.69 mg/dL (ref 0.61–1.24)
GFR, Estimated: >60 mL/min (ref >60)
Glucose, Bld: 101 mg/dL — ABNORMAL HIGH (ref 70–99)
Potassium: 4.7 mmol/L (ref 3.5–5.1)
Sodium: 138 mmol/L (ref 135–145)

## 2022-11-09 LAB — CBC
HCT: 37.6 % — ABNORMAL LOW (ref 39.0–52.0)
Hemoglobin: 12.8 g/dL — ABNORMAL LOW (ref 13.0–17.0)
MCH: 33 pg (ref 26.0–34.0)
MCHC: 34 g/dL (ref 30.0–36.0)
MCV: 96.9 fL (ref 80.0–100.0)
Platelets: 100 10*3/uL — ABNORMAL LOW (ref 150–400)
RBC: 3.88 MIL/uL — ABNORMAL LOW (ref 4.22–5.81)
RDW: 12 % (ref 11.5–15.5)
WBC: 5.6 10*3/uL (ref 4.0–10.5)
nRBC: 0 % (ref 0.0–0.2)

## 2022-11-09 LAB — MAGNESIUM: Magnesium: 2 mg/dL (ref 1.7–2.4)

## 2022-11-09 LAB — GLUCOSE, CAPILLARY: Glucose-Capillary: 101 mg/dL — ABNORMAL HIGH (ref 70–99)

## 2022-11-09 NOTE — NC FL2 (Signed)
[place] LEVEL OF CARE FORM     IDENTIFICATION  Patient Name: Joseph Simpson Birthdate: Nov 03, 1935 Sex: male Admission Date (Current Location): 11/05/2022  [facility] and [place] Number:  Herbalist and Address:  [facility] [place],  [place], [place]      Provider Number: [id number]  Attending Physician Name and Address:  Damita Lack, MD  Relative Name and Phone Number:  son, Chelsea Nusz [phone]    Current Level of Care: Hospital Recommended Level of Care: [place] Prior Approval Number:    Date Approved/Denied:   PASRR Number: [id number]  Discharge Plan: SNF    Current Diagnoses: Patient Active Problem List   Diagnosis Date Noted   Delirium 11/06/2022   Altered mental status 11/05/2022   Abdominal pain, epigastric 11/02/2022   Intractable nausea and vomiting 10/30/2022   Nausea & vomiting 10/29/2022   Gastritis and duodenitis 10/29/2022   GI bleed 01/07/2022   Left wrist pain 10/01/2021   Bilateral foot pain 07/04/2021   S/P hip replacement, left 02/11/2021   Hyperglycemia 12/21/2019   B12 deficiency 03/09/2019   Osteoarthritis of left hip 01/30/2019   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 12/29/2017   New onset a-fib [facility])    Gastroparesis 02/15/2017   Hx of adenomatous colonic polyps 02/15/2017   Left inguinal hernia 07/12/2016   Hyperlipidemia 06/10/2016   PAC (premature atrial contraction) 10/25/2015   Squamous cell carcinoma in situ 09/09/2015   BPH (benign prostatic hyperplasia) 11/23/2014   Palpitations 07/31/2014   Cough 05/10/2014   Pulmonary nodule 05/10/2014   Dyspnea 02/27/2014   CAD (coronary artery disease)    Murmur 02/10/2012   Macular degeneration 11/17/2011   Essential hypertension 09/23/2007   GERD 09/23/2007    Orientation RESPIRATION BLADDER Height & Weight     Self  Normal Continent, Indwelling catheter Weight: 169 lb 5 oz (76.8  kg) Height:  '6\' 1"'$  (185.4 cm)  BEHAVIORAL SYMPTOMS/MOOD NEUROLOGICAL BOWEL NUTRITION STATUS      Continent    AMBULATORY STATUS COMMUNICATION OF NEEDS Skin   Extensive Assist Verbally Normal                       Personal Care Assistance Level of Assistance  Bathing, Dressing Bathing Assistance: Limited assistance   Dressing Assistance: Limited assistance     Functional Limitations Info  Sight, Hearing, Speech Sight Info: Adequate Hearing Info: Impaired Speech Info: Adequate    SPECIAL CARE FACTORS FREQUENCY  PT (By licensed PT), OT (By licensed OT)     PT Frequency: 5x/wk OT Frequency: 5x/wk            Contractures Contractures Info: Not present    Additional Factors Info  Code Status, Allergies Code Status Info: Full Allergies Info: Tape, Aspirin, Percocet (Oxycodone-acetaminophen), Vitamins (Apatate)           Current Medications (11/09/2022):  This is the current hospital active medication list Current Facility-Administered Medications  Medication Dose Route Frequency Provider Last Rate Last Admin   acetaminophen (TYLENOL) tablet 500-1,000 mg  500-1,000 mg Oral BID PRN Barb Merino, MD       apixaban (ELIQUIS) tablet 5 mg  5 mg Oral BID Barb Merino, MD   5 mg at 11/09/22 0903   atorvastatin (LIPITOR) tablet 20 mg  20 mg Oral Q M,W,F-2000 Barb Merino, MD   20 mg at 11/06/22 2051   Chlorhexidine Gluconate Cloth 2 % PADS 6  each  6 each Topical Daily Damita Lack, MD   6 each at 11/08/22 1456   docusate sodium (COLACE) capsule 200 mg  200 mg Oral QHS Barb Merino, MD   200 mg at 11/08/22 2108   guaiFENesin (ROBITUSSIN) 100 MG/5ML liquid 5 mL  5 mL Oral Q4H PRN Amin, Ankit Chirag, MD       hydrALAZINE (APRESOLINE) injection 10 mg  10 mg Intravenous Q4H PRN Amin, Ankit Chirag, MD       ipratropium-albuterol (DUONEB) 0.5-2.5 (3) MG/3ML nebulizer solution 3 mL  3 mL Nebulization Q4H PRN Amin, Ankit Chirag, MD       losartan (COZAAR) tablet 25 mg   25 mg Oral Daily Barb Merino, MD   25 mg at 11/09/22 0903   metoCLOPramide (REGLAN) tablet 5 mg  5 mg Oral TID Pilar Grammes, MD   5 mg at 11/09/22 0903   metoprolol tartrate (LOPRESSOR) injection 5 mg  5 mg Intravenous Q4H PRN Amin, Ankit Chirag, MD       metoprolol tartrate (LOPRESSOR) tablet 12.5 mg  12.5 mg Oral BID Barb Merino, MD   12.5 mg at 11/09/22 0908   ondansetron (ZOFRAN) injection 4 mg  4 mg Intravenous Q6H PRN Amin, Ankit Chirag, MD       Oral care mouth rinse  15 mL Mouth Rinse PRN Amin, Ankit Chirag, MD       oxyCODONE (Oxy IR/ROXICODONE) immediate release tablet 5 mg  5 mg Oral Q4H PRN Amin, Ankit Chirag, MD       pantoprazole (PROTONIX) EC tablet 40 mg  40 mg Oral BID Barb Merino, MD   40 mg at 11/09/22 0904   polyethylene glycol (MIRALAX / GLYCOLAX) packet 17 g  17 g Oral Daily Amin, Ankit Chirag, MD   17 g at 11/09/22 4315   polyvinyl alcohol (LIQUIFILM TEARS) 1.4 % ophthalmic solution 1 drop  1 drop Both Eyes QHS Ghimire, Dante Gang, MD   1 drop at 11/08/22 2108   senna-docusate (Senokot-S) tablet 1 tablet  1 tablet Oral QHS PRN Amin, Ankit Chirag, MD       tamsulosin (FLOMAX) capsule 0.4 mg  0.4 mg Oral Daily Barb Merino, MD   0.4 mg at 11/09/22 4008   thiamine (VITAMIN B1) 500 mg in normal saline (50 mL) IVPB  500 mg Intravenous Q24H Amin, Ankit Chirag, MD 100 mL/hr at 11/08/22 1453 500 mg at 11/08/22 1453     Discharge Medications: Please see discharge summary for a list of discharge medications.  Relevant Imaging Results:  Relevant Lab Results:   Additional Information SS# [id number]  Lennart Pall, LCSW

## 2022-11-09 NOTE — TOC Initial Note (Signed)
Transition of Care [facility]) - Initial/Assessment Note    Patient Details  Name: Joseph Simpson MRN: [id number] Date of Birth: 01-Jul-1935  Transition of Care [facility]) CM/SW Contact:    Lennart Pall, LCSW Phone Number: 11/09/2022, 11:32 AM  Clinical Narrative:                 Met with pt and son today to review TOC/ CSW role with dc planning.  Pt is oriented to self and son providing all information.  Son aware and agreeable with therapy recommendations for SNF rehab and we reviewed preferred facilities.  Will begin SNF bed search.  Expected Discharge Plan: Skilled Nursing Facility Barriers to Discharge: Continued Medical Work up, SNF Pending bed offer   Patient Goals and CMS Choice Patient states their goals for this hospitalization and ongoing recovery are:: return home following rehab          Expected Discharge Plan and Services In-house Referral: Clinical Social Work   Post Acute Care Choice: [place] Living arrangements for the past 2 months: [place]                 DME Arranged: N/A DME Agency: NA                  Prior Living Arrangements/Services Living arrangements for the past 2 months: [place] Lives with:: Relatives Patient language and need for interpreter reviewed:: Yes Do you feel safe going back to the place where you live?: Yes      Need for Family Participation in Patient Care: Yes (Comment) Care giver support system in place?: No (comment)      Activities of Daily Living Home Assistive Devices/Equipment: Walker (specify type), Cane (specify quad or straight) ADL Screening (condition at time of admission) Patient's cognitive ability adequate to safely complete daily activities?: No Is the patient deaf or have difficulty hearing?: Yes Does the patient have difficulty seeing, even when wearing glasses/contacts?: No Does the patient have difficulty concentrating, remembering, or making decisions?: Yes Patient able  to express need for assistance with ADLs?: No Does the patient have difficulty dressing or bathing?: No Independently performs ADLs?: No Toileting: Needs assistance In/Out Bed: Needs assistance Is this a change from baseline?: Change from baseline, expected to last <3 days Walks in Home: Needs assistance Does the patient have difficulty walking or climbing stairs?: Yes Weakness of Legs: Both Weakness of Arms/Hands: None  Permission Sought/Granted Permission sought to share information with : Family Supports Permission granted to share information with : Yes, Verbal Permission Granted  Share Information with NAME: Apolo Cutshaw     Permission granted to share info w Relationship: son  Permission granted to share info w Contact Information: [phone]  Emotional Assessment Appearance:: Appears stated age Attitude/Demeanor/Rapport: Gracious Affect (typically observed): Pleasant, Quiet Orientation: : Oriented to Self Alcohol / Substance Use: Not Applicable Psych Involvement: No (comment)  Admission diagnosis:  Delirium [R41.0] Altered mental status [R41.82] Weakness [R53.1] Patient Active Problem List   Diagnosis Date Noted   Delirium 11/06/2022   Altered mental status 11/05/2022   Abdominal pain, epigastric 11/02/2022   Intractable nausea and vomiting 10/30/2022   Nausea & vomiting 10/29/2022   Gastritis and duodenitis 10/29/2022   GI bleed 01/07/2022   Left wrist pain 10/01/2021   Bilateral foot pain 07/04/2021   S/P hip replacement, left 02/11/2021   Hyperglycemia 12/21/2019   B12 deficiency 03/09/2019   Osteoarthritis of left hip 01/30/2019   Localized primary  osteoarthritis of right shoulder region 12/29/2017   New onset a-fib [facility])    Gastroparesis 02/15/2017   Hx of adenomatous colonic polyps 02/15/2017   Left inguinal hernia 07/12/2016   Hyperlipidemia 06/10/2016   PAC (premature atrial contraction) 10/25/2015   Squamous cell carcinoma in situ 09/09/2015    BPH (benign prostatic hyperplasia) 11/23/2014   Palpitations 07/31/2014   Cough 05/10/2014   Pulmonary nodule 05/10/2014   Dyspnea 02/27/2014   CAD (coronary artery disease)    Murmur 02/10/2012   Macular degeneration 11/17/2011   Essential hypertension 09/23/2007   GERD 09/23/2007   PCP:  Marin Olp, MD Pharmacy:   [facility], [place] [address] [place] [address] Phone: [phone] Fax: [phone]  [id number] [place] [id number]: [phone]: [phone]    Social Determinants of Health (SDOH) [place] No Food Insecurity (11/05/2022)  Housing: Low Risk  (11/05/2022)  Transportation Needs: No Transportation Needs (11/05/2022)  Utilities: Not At Risk (11/05/2022)  Depression (PHQ2-9): Low Risk  (09/21/2022)  Financial Resource Strain: Low Risk  (09/17/2022)  Physical Activity: Sufficiently Active (09/17/2022)  Social Connections: Moderately Isolated (09/17/2022)  Stress: No Stress Concern Present (09/17/2022)  Tobacco Use: Medium Risk (11/05/2022)   SDOH Interventions:     Readmission Risk Interventions     No data to display

## 2022-11-09 NOTE — Progress Notes (Signed)
PROGRESS NOTE    MILLER LIMEHOUSE  KYH:[id number] DOB: [birth year] DOA: 11/05/2022 PCP: Marin Olp, MD   Brief Narrative:  87 year old male with history of HTN, HLD, chronic A-fib on Eliquis, CAD, partial gastrectomy and gastrojejunostomy about 40 years ago admitted to the hospital for generalized weakness and nausea vomiting for 1 day.  CT of the abdomen pelvis was unremarkable.  Patient undergone GI endoscopy on 12/18 showing esophageal plaques suspicious for candidiasis.  He has had couple of recent ED visits and hospitalization.  CT of the head does not show any acute pathology.  UA does not show any evidence of active infection but eventually cultures grew Enterococcus therefore received 1 dose of fosfomycin on 12/22.  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  Patient was started on IV thiamine.  Assessment & Plan:  Principal Problem:   Altered mental status Active Problems:   Delirium   Altered mental status; improving Wernicke encephalopathy.  Dizziness, Orthostatis - Mentation continues to improve.  Unclear etiology.  Possible delirium.  CT of the head is negative.  No obvious evidence of infection.  Ammonia/TSH= WNL.  Already on B12 injections MRI Brain showed Wernicke encephalopathy.  Day 4 thiamine 5 mg IV.  Nausea with poor appetite and esophagitis - Recent endoscopy in 12/18 negative for Candida on biopsy.  Currently on PPI with Reglan.  Encourage p.o. intake.  Enteroccocus UTI -Fosfomycin s/p 1 dose on 12/22  Paroxysmal atrial fibrillation - On metoprolol, Eliquis.  IV as needed  Essential hypertension - On metoprolol and losartan.  IV as needed ordered  Hypokalemia/hypomagnesemia - Replete as necessary  History of prostate hypertrophy with acute urinary retention - Catheter exchanged with outpatient urology follow-up.    DVT prophylaxis: Eliquis Code Status: Full code Family Communication:  Spoke with his  Son  Mentation better, SNF placement   Subjective: Doing better, eating his breakfast.   Examination: Constitutional: Not in acute distress Respiratory: Clear to auscultation bilaterally Cardiovascular: Normal sinus rhythm, no rubs Abdomen: Nontender nondistended good bowel sounds Musculoskeletal: No edema noted Skin: No rashes seen Neurologic: CN 2-12 grossly intact.  And nonfocal Psychiatric: Normal judgment and insight. Alert and oriented x 3. Normal mood.   Foley in place.  Objective: Vitals:   11/08/22 1703 11/08/22 1930 11/09/22 0408 11/09/22 0900  BP: (!) 96/55 (!) 94/52 (!) 109/57 (!) 144/68  Pulse: (!) 54 (!) 58 (!) 57 66  Resp: '16 14 14   '$ Temp: 98.7 F (37.1 C) 98 F (36.7 C) 98.7 F (37.1 C)   TempSrc: Oral Oral Oral   SpO2: 97% 99% 97%   Weight:   76.8 kg   Height:        Intake/Output Summary (Last 24 hours) at 11/09/2022 0958 Last data filed at 11/09/2022 0413 Gross per 24 hour  Intake --  Output 1350 ml  Net -1350 ml   Filed Weights   11/06/22 0410 11/07/22 0459 11/09/22 0408  Weight: 73.3 kg 72.7 kg 76.8 kg     Data Reviewed:   CBC: Recent Labs  Lab 11/05/22 1237 11/06/22 0545 11/07/22 0731 11/08/22 0653 11/09/22 0755  WBC 12.3* 11.5* 11.8* 7.7 5.6  NEUTROABS 10.8*  --   --   --   --   HGB 17.3* 16.9 14.0 13.2 12.8*  HCT 51.1 50.9 41.5 39.2 37.6*  MCV 95.0 97.0 96.5 94.7 96.9  PLT 149* 167 113* 108* 151*   Basic Metabolic Panel: Recent Labs  Lab 11/05/22 1237  11/06/22 0545 11/07/22 0731 11/08/22 0653 11/09/22 0755  NA 139 136 137 136 138  K 3.3* 3.7 3.8 3.8 4.7  CL 107 102 103 107 105  CO2 20* 19* '27 24 25  '$ GLUCOSE 160* 129* 88 93 101*  BUN 27* 20 27* 24* 13  CREATININE 0.63 0.62 0.92 0.68 0.69  CALCIUM 8.2* 9.0 8.7* 8.4* 8.5*  MG  --  2.3 2.5* 2.4 2.0   GFR: Estimated Creatinine Clearance: 73.3 mL/min (by C-G formula based on SCr of 0.69 mg/dL). Liver Function Tests: Recent Labs  Lab 11/05/22 1237  AST 20  ALT  23  ALKPHOS 49  BILITOT 1.3*  PROT 6.1*  ALBUMIN 3.2*   Recent Labs  Lab 11/05/22 1237  LIPASE 47   Recent Labs  Lab 11/06/22 0912  AMMONIA 23   Coagulation Profile: No results for input(s): "INR", "PROTIME" in the last 168 hours. Cardiac Enzymes: No results for input(s): "CKTOTAL", "CKMB", "CKMBINDEX", "TROPONINI" in the last 168 hours. BNP (last 3 results) No results for input(s): "PROBNP" in the last 8760 hours. HbA1C: No results for input(s): "HGBA1C" in the last 72 hours. CBG: No results for input(s): "GLUCAP" in the last 168 hours. Lipid Profile: No results for input(s): "CHOL", "HDL", "LDLCALC", "TRIG", "CHOLHDL", "LDLDIRECT" in the last 72 hours. Thyroid Function Tests: No results for input(s): "TSH", "T4TOTAL", "FREET4", "T3FREE", "THYROIDAB" in the last 72 hours.  Anemia Panel: No results for input(s): "VITAMINB12", "FOLATE", "FERRITIN", "TIBC", "IRON", "RETICCTPCT" in the last 72 hours. Sepsis Labs: No results for input(s): "PROCALCITON", "LATICACIDVEN" in the last 168 hours.  Recent Results (from the past 240 hour(s))  Urine Culture     Status: Abnormal   Collection Time: 11/05/22  2:56 PM   Specimen: Urine, Clean Catch  Result Value Ref Range Status   Specimen Description   Final    URINE, CLEAN CATCH Performed at [facility], [place] [address], [place], [place] [address]    Special Requests   Final    NONE Performed at [facility], [place] [address] [place], [place] [address]    Culture 60,000 COLONIES/mL ENTEROCOCCUS FAECALIS (A)  Final   Report Status 11/07/2022 FINAL  Final   Organism ID, Bacteria ENTEROCOCCUS FAECALIS (A)  Final      Susceptibility   Enterococcus faecalis - MIC*    AMPICILLIN <=2 SENSITIVE Sensitive     NITROFURANTOIN <=16 SENSITIVE Sensitive     VANCOMYCIN 1 SENSITIVE Sensitive     * 60,000 COLONIES/mL ENTEROCOCCUS FAECALIS         Radiology Studies: No results  found.      Scheduled Meds:  apixaban  5 mg Oral BID   atorvastatin  20 mg Oral Q M,W,F-2000   Chlorhexidine Gluconate Cloth  6 each Topical Daily   docusate sodium  200 mg Oral QHS   losartan  25 mg Oral Daily   metoCLOPramide  5 mg Oral TID AC   metoprolol tartrate  12.5 mg Oral BID   pantoprazole  40 mg Oral BID   polyethylene glycol  17 g Oral Daily   polyvinyl alcohol  1 drop Both Eyes QHS   tamsulosin  0.4 mg Oral Daily   Continuous Infusions:  lactated ringers 100 mL/hr at 11/08/22 1016   thiamine (VITAMIN B1) injection 500 mg (11/08/22 1453)     LOS: 3 days   Time spent= 35 mins    Beniah Magnan Arsenio Loader, MD [facility]  If 7PM-7AM, please contact night-coverage  11/09/2022, 9:58 AM

## 2022-11-10 DIAGNOSIS — R4182 Altered mental status, unspecified: Secondary | ICD-10-CM | POA: Diagnosis not present

## 2022-11-10 LAB — CBC
HCT: 37.1 % — ABNORMAL LOW (ref 39.0–52.0)
Hemoglobin: 12.5 g/dL — ABNORMAL LOW (ref 13.0–17.0)
MCH: 31.6 pg (ref 26.0–34.0)
MCHC: 33.7 g/dL (ref 30.0–36.0)
MCV: 93.7 fL (ref 80.0–100.0)
Platelets: 117 10*3/uL — ABNORMAL LOW (ref 150–400)
RBC: 3.96 MIL/uL — ABNORMAL LOW (ref 4.22–5.81)
RDW: 11.9 % (ref 11.5–15.5)
WBC: 5.9 10*3/uL (ref 4.0–10.5)
nRBC: 0 % (ref 0.0–0.2)

## 2022-11-10 LAB — BASIC METABOLIC PANEL
Anion gap: 7 (ref 5–15)
BUN: 11 mg/dL (ref 8–23)
CO2: 25 mmol/L (ref 22–32)
Calcium: 8.6 mg/dL — ABNORMAL LOW (ref 8.9–10.3)
Chloride: 105 mmol/L (ref 98–111)
Creatinine, Ser: 0.55 mg/dL — ABNORMAL LOW (ref 0.61–1.24)
GFR, Estimated: >60 mL/min (ref >60)
Glucose, Bld: 99 mg/dL (ref 70–99)
Potassium: 3.7 mmol/L (ref 3.5–5.1)
Sodium: 137 mmol/L (ref 135–145)

## 2022-11-10 LAB — MAGNESIUM: Magnesium: 2.1 mg/dL (ref 1.7–2.4)

## 2022-11-10 MED ORDER — SODIUM CHLORIDE 0.9 % IV BOLUS
500.0000 mL | Freq: Once | INTRAVENOUS | Status: AC
  Administered 2022-11-10: 500 mL via INTRAVENOUS

## 2022-11-10 MED ORDER — MAGNESIUM CITRATE PO SOLN
1.0000 | Freq: Once | ORAL | Status: AC
  Administered 2022-11-10: 1 via ORAL
  Filled 2022-11-10: qty 296

## 2022-11-10 MED ORDER — THIAMINE HCL 100 MG PO TABS: 100.0000 mg | ORAL_TABLET | Freq: Every day | ORAL | Status: DC

## 2022-11-10 NOTE — Progress Notes (Addendum)
Occupational Therapy Treatment Patient Details Name: Joseph Simpson MRN: [id number] DOB: 01/07/1935 Today's Date: 11/10/2022   History of present illness Patient is a 87 year old male admitted to the hospital with weakness, difficulty standing & walking, and confusion. 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   PMH: HTN, HLD, a fib, CAD, partial gastrectomy and gastrojejunostomy, arthritis, melanoma, OSA, macular degeneration, polyneuropathy   OT comments  Patient was noted to progress towards short term goals on this date. Patient was able to engage in grooming tasks sitting EOB with min guard after set up. Patient was noted to require less physical A for transfers but still +2 for safety. Patient would continue to benefit from skilled OT services at this time while admitted and after d/c to address noted deficits in order to improve overall safety and independence in ADLs. Patient's discharge plan remains appropriate at this time. OT will continue to follow acutely.     Recommendations for follow up therapy are one component of a multi-disciplinary discharge planning process, led by the attending physician.  Recommendations may be updated based on patient status, additional functional criteria and insurance authorization.    Follow Up Recommendations  Skilled nursing-short term rehab (<3 hours/day)     Assistance Recommended at Discharge Frequent or constant Supervision/Assistance  Patient can return home with the following  A lot of help with walking and/or transfers;A lot of help with bathing/dressing/bathroom;Assistance with cooking/housework;Direct supervision/assist for medications management;Direct supervision/assist for financial management;Assist for transportation;Help with stairs or  ramp for entrance   Equipment Recommendations  Other (comment) (defer to next venue)       Precautions / Restrictions Precautions Precautions: Fall Precaution Comments: severely HOH Restrictions Weight Bearing Restrictions: No       Mobility Bed Mobility Overal bed mobility: Needs Assistance Bed Mobility: Supine to Sit     Supine to sit: +2 for safety/equipment, Min assist, HOB elevated     General bed mobility comments: cues for use of bed rail and pt reaching for therapists hand to assist. min assist         Balance Overall balance assessment: Needs assistance Sitting-balance support: Feet supported Sitting balance-Leahy Scale: Fair     Standing balance support: Bilateral upper extremity supported Standing balance-Leahy Scale: Poor                             ADL either performed or assessed with clinical judgement   ADL Overall ADL's : Needs assistance/impaired Eating/Feeding: Supervision/ safety Eating/Feeding Details (indicate cue type and reason): was able to complete self feeding with no noted spillage sitting in recliner in room Grooming: Brushing hair;Set up;Sitting Grooming Details (indicate cue type and reason): in recliner in room                 Toilet Transfer: +2 for physical assistance;+2 for safety/equipment;Minimal assistance;Ambulation;Rolling walker (2 wheels) Toilet Transfer Details (indicate cue type and reason): to participate in functional mobility into hallway with progression with increased time to 1+ assist.                  Cognition Arousal/Alertness: Awake/alert Behavior During Therapy: WFL for tasks assessed/performed Overall Cognitive Status: No family/caregiver present to determine baseline cognitive functioning           General Comments: pt pleasant and following commands well  this date. able to answer questions appropriately.                   Pertinent Vitals/ Pain       Pain  Assessment Pain Assessment: No/denies pain   Frequency  Min 2X/week        Progress Toward Goals  OT Goals(current goals can now be found in the care plan section)  Progress towards OT goals: Progressing toward goals     Plan Discharge plan remains appropriate       AM-PAC OT "6 Clicks" Daily Activity     Outcome Measure   Help from another person eating meals?: A Little Help from another person taking care of personal grooming?: A Little Help from another person toileting, which includes using toliet, bedpan, or urinal?: A Lot Help from another person bathing (including washing, rinsing, drying)?: A Lot Help from another person to put on and taking off regular upper body clothing?: A Lot Help from another person to put on and taking off regular lower body clothing?: A Lot 6 Click Score: 14    End of Session Equipment Utilized During Treatment: Gait belt;Rolling walker (2 wheels)  OT Visit Diagnosis: Muscle weakness (generalized) (M62.81)   Activity Tolerance Patient tolerated treatment well   Patient Left with call bell/phone within reach;in chair;with chair alarm set   Nurse Communication Mobility status        Time: [phone] OT Time Calculation (min): 22 min  Charges: OT General Charges $OT Visit: 1 Visit OT Treatments $Self Care/Home Management : 8-22 mins  Rennie Plowman, MS Acute Rehabilitation Department Office# [phone]   Willa Rough 11/10/2022, 10:55 AM

## 2022-11-10 NOTE — Care Management Important Message (Signed)
Important Message  Patient Details IM Letter given Name: Joseph Simpson MRN: [id number] Date of Birth: 1935-01-16   Medicare Important Message Given:  Yes     Kerin Salen 11/10/2022, 10:12 AM

## 2022-11-10 NOTE — Progress Notes (Signed)
Physical Therapy Treatment Patient Details Name: Joseph Simpson MRN: [id number] DOB: 04-17-1935 Today's Date: 11/10/2022   History of Present Illness Patient is a 87 year old male admitted to the hospital with weakness, difficulty standing & walking, and confusion. 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   PMH: HTN, HLD, a fib, CAD, partial gastrectomy and gastrojejunostomy, arthritis, melanoma, OSA, macular degeneration, polyneuropathy    PT Comments    Patient more alert and able to follow commands/cues today. Min assist required for bed mobility and Mod with 2+ for safety for sit<>stand from EOB. No significant ant/post lean noted in standing and pt able to maintain balance with RW. Ambulation initiated and pt walked ~150' with RW and mod assist progressing to  min assist for guide gait direction as pt tends to drift Lt. No buckling or LOB. Seated rest provided due to fatigue. Continue to recommend SNF rehab at this time, will continue to progress as able.   Recommendations for follow up therapy are one component of a multi-disciplinary discharge planning process, led by the attending physician.  Recommendations may be updated based on patient status, additional functional criteria and insurance authorization.  Follow Up Recommendations  Skilled nursing-short term rehab (<3 hours/day) Can patient physically be transported by private vehicle: No   Assistance Recommended at Discharge Frequent or constant Supervision/Assistance  Patient can return home with the following Assistance with cooking/housework;Direct supervision/assist for medications management;Assist for transportation;Help with stairs or ramp for entrance;A little help with walking and/or transfers;A little help with  bathing/dressing/bathroom   Equipment Recommendations   (TBA)    Recommendations for Other Services       Precautions / Restrictions Precautions Precautions: Fall Precaution Comments: severely HOH Restrictions Weight Bearing Restrictions: No     Mobility  Bed Mobility Overal bed mobility: Needs Assistance Bed Mobility: Supine to Sit     Supine to sit: +2 for safety/equipment, Min assist, HOB elevated     General bed mobility comments: cues for use of bed rail and pt reaching for therapists hand to assist. min assist to raise trunk and pt able to scoot towards EOB with VC's to use bil UE's. bed pad used for min assist to fully place feet on floor.    Transfers Overall transfer level: Needs assistance Equipment used: Rolling walker (2 wheels) Transfers: Sit to/from Stand Sit to Stand: Mod assist, +2 safety/equipment, From elevated surface           General transfer comment: Mod Assist to rise, mod cues for hand placement, pt power up with single UE on EOB. 2+ for safety. no significant ant/post lean in standing.    Ambulation/Gait Ambulation/Gait assistance: Mod assist, Min assist, +2 safety/equipment Gait Distance (Feet): 150 Feet Assistive device: Rolling walker (2 wheels) Gait Pattern/deviations: Step-through pattern, Decreased stride length, Drifts right/left, Staggering left Gait velocity: decr     General Gait Details: mod assist at start to steady balance, pt drifting Lt>Rt during gait. supinating with foot contact during stance. chair follow provided for safety.   Stairs             Wheelchair Mobility    Modified Rankin (Stroke Patients Only)       Balance Overall balance assessment: Needs assistance Sitting-balance support: Feet supported Sitting balance-Leahy Scale: Fair Sitting balance - Comments: Rt lean at start, cue to lean on Lt forearm improved balance and  pt able to maintain upright posture after ~1-2 minutes and bed flattened.    Standing balance support: Bilateral upper extremity supported, During functional activity, Reliant on assistive device for balance Standing balance-Leahy Scale: Poor                              Cognition Arousal/Alertness: Awake/alert Behavior During Therapy: WFL for tasks assessed/performed Overall Cognitive Status: No family/caregiver present to determine baseline cognitive functioning                                 General Comments: pt pleasant and following commands well this date. able to answer questions appropriately.        Exercises      General Comments        Pertinent Vitals/Pain Pain Assessment Pain Assessment: No/denies pain    Home Living                          Prior Function            PT Goals (current goals can now be found in the care plan section) Acute Rehab PT Goals PT Goal Formulation: Patient unable to participate in goal setting Time For Goal Achievement: 11/20/22 Potential to Achieve Goals: Good Progress towards PT goals: Progressing toward goals    Frequency    Min 3X/week      PT Plan Current plan remains appropriate    Co-evaluation              AM-PAC PT "6 Clicks" Mobility   Outcome Measure  Help needed turning from your back to your side while in a flat bed without using bedrails?: A Little Help needed moving from lying on your back to sitting on the side of a flat bed without using bedrails?: A Little Help needed moving to and from a bed to a chair (including a wheelchair)?: A Lot Help needed standing up from a chair using your arms (e.g., wheelchair or bedside chair)?: A Lot Help needed to walk in hospital room?: A Lot Help needed climbing 3-5 steps with a railing? : Total 6 Click Score: 13    End of Session Equipment Utilized During Treatment: Gait belt Activity Tolerance: Patient tolerated treatment well Patient left: in chair;with call bell/phone within reach;with  chair alarm set Nurse Communication: Mobility status PT Visit Diagnosis: Other abnormalities of gait and mobility (R26.89);Muscle weakness (generalized) (M62.81);Other symptoms and signs involving the nervous system (R29.898);Ataxic gait (R26.0);Dizziness and giddiness (R42)     Time: [id number] PT Time Calculation (min) (ACUTE ONLY): 20 min  Charges:  $Gait Training: 8-22 mins                     Verner Mould, DPT Acute Rehabilitation Services Office [phone]  11/10/22 10:49 AM

## 2022-11-10 NOTE — TOC Progression Note (Signed)
Transition of Care [facility]) - Progression Note   Patient Details  Name: Joseph Simpson MRN: [id number] Date of Birth: [birth year]  Transition of Care [facility]) CM/SW Rogers, LCSW Phone Number: 11/10/2022, 1:33 PM  Clinical Narrative: Admissions is closed at [facility] due to the holiday. CSW discussed bed offers with patient's wife/son and the family is requesting [facility] as it is closer to family. CSW confirmed bed availability for tomorrow with Broad[facility] John in admissions. CSW updated hospitalist.  Expected Discharge Plan: [place] Barriers to Discharge: Continued Medical Work up, SNF Pending bed offer  Expected Discharge Plan and Services In-house Referral: Clinical Social Work Post Acute Care Choice: [place] Living arrangements for the past 2 months: Single Family Home           DME Arranged: N/A DME Agency: NA  Social Determinants of Health (SDOH) Interventions [place]: No Food Insecurity (11/05/2022)  Housing: Low Risk  (11/05/2022)  Transportation Needs: No Transportation Needs (11/05/2022)  Utilities: Not At Risk (11/05/2022)  Depression (PHQ2-9): Low Risk  (09/21/2022)  Financial Resource Strain: Low Risk  (09/17/2022)  Physical Activity: Sufficiently Active (09/17/2022)  Social Connections: Moderately Isolated (09/17/2022)  Stress: No Stress Concern Present (09/17/2022)  Tobacco Use: Medium Risk (11/09/2022)   Readmission Risk Interventions     No data to display

## 2022-11-10 NOTE — Progress Notes (Signed)
PROGRESS NOTE    Joseph Simpson  FUX:[id number] DOB: [birth year] DOA: 11/05/2022 PCP: Joseph Simpson   Brief Narrative:  87 year old male with history of HTN, HLD, chronic A-fib on Eliquis, CAD, partial gastrectomy and gastrojejunostomy about 40 years ago admitted to the hospital for generalized weakness and nausea vomiting for 1 day.  CT of the abdomen pelvis was unremarkable.  Patient undergone GI endoscopy on 12/18 showing esophageal plaques suspicious for candidiasis.  He has had couple of recent ED visits and hospitalization.  CT of the head does not show any acute pathology.  UA does not show any evidence of active infection but eventually cultures grew Enterococcus therefore received 1 dose of fosfomycin on 12/22.  Patient was also started on thiamine injection due to [place] encephalopathy.  Patient was started on IV thiamine.  Patient's mentation is doing better, PT OT recommended SNF.  Awaiting placement  Assessment & Plan:  Principal Problem:   Altered mental status Active Problems:   Delirium   Altered mental status; significantly improved Wernicke encephalopathy.  Dizziness, Orthostatis - Improved unclear etiology.  Possible delirium.  CT of the head is negative.  No obvious evidence of infection.  Ammonia/TSH= WNL.  Already on B12 injections MRI Brain showed Wernicke encephalopathy.  Last day of IV thiamine today  Nausea with poor appetite and esophagitis - Recent endoscopy in 12/18 negative for Candida on biopsy.  Currently on PPI with Reglan.  Encourage p.o. intake.  Enteroccocus UTI -Fosfomycin s/p 1 dose on 12/22  Paroxysmal atrial fibrillation - On metoprolol, Eliquis.  IV as needed  Essential hypertension - On metoprolol and losartan.  IV as needed ordered  Hypokalemia/hypomagnesemia - Replete as necessary  History of prostate hypertrophy with acute urinary retention - Catheter exchanged with outpatient urology follow-up.  Voiding trial can be performed  the patient is here until 11/12/2022.  DVT prophylaxis: Eliquis Code Status: Full code Family Communication: Updated son yesterday  Mentation better, SNF placement   Subjective: Sitting up in the chair, no complaints  Examination: Constitutional: Not in acute distress Respiratory: Clear to auscultation bilaterally Cardiovascular: Normal sinus rhythm, no rubs Abdomen: Nontender nondistended good bowel sounds Musculoskeletal: No edema noted Skin: No rashes seen Neurologic: CN 2-12 grossly intact.  And nonfocal Psychiatric: Normal judgment and insight. Alert and oriented x 3. Normal mood.  Foley in place.  Objective: Vitals:   11/09/22 0900 11/09/22 1359 11/09/22 2300 11/10/22 0525  BP: (!) 144/68 (!) 109/56 (!) 140/75 (!) 117/54  Pulse: 66 64 70 70  Resp:  '18 18 18  '$ Temp:  97.7 F (36.5 C) 98 F (36.7 C) 97.7 F (36.5 C)  TempSrc:  Oral Oral Oral  SpO2:  94% 97% 97%  Weight:      Height:        Intake/Output Summary (Last 24 hours) at 11/10/2022 1056 Last data filed at 11/10/2022 0900 Gross per 24 hour  Intake 240 ml  Output 1150 ml  Net -910 ml   Filed Weights   11/06/22 0410 11/07/22 0459 11/09/22 0408  Weight: 73.3 kg 72.7 kg 76.8 kg     Data Reviewed:   CBC: Recent Labs  Lab 11/05/22 1237 11/06/22 0545 11/07/22 0731 11/08/22 0653 11/09/22 0755 11/10/22 0617  WBC 12.3* 11.5* 11.8* 7.7 5.6 5.9  NEUTROABS 10.8*  --   --   --   --   --   HGB 17.3* 16.9 14.0 13.2 12.8* 12.5*  HCT 51.1 50.9 41.5 39.2 37.6* 37.1*  MCV  95.0 97.0 96.5 94.7 96.9 93.7  PLT 149* 167 113* 108* 100* 259*   Basic Metabolic Panel: Recent Labs  Lab 11/06/22 0545 11/07/22 0731 11/08/22 0653 11/09/22 0755 11/10/22 0617  NA 136 137 136 138 137  K 3.7 3.8 3.8 4.7 3.7  CL 102 103 107 105 105  CO2 19* '27 24 25 25  '$ GLUCOSE 129* 88 93 101* 99  BUN 20 27* 24* 13 11  CREATININE 0.62 0.92 0.68 0.69 0.55*  CALCIUM 9.0 8.7* 8.4* 8.5* 8.6*  MG 2.3 2.5* 2.4 2.0 2.1    GFR: Estimated Creatinine Clearance: 73.3 mL/min (A) (by C-G formula based on SCr of 0.55 mg/dL (L)). Liver Function Tests: Recent Labs  Lab 11/05/22 1237  AST 20  ALT 23  ALKPHOS 49  BILITOT 1.3*  PROT 6.1*  ALBUMIN 3.2*   Recent Labs  Lab 11/05/22 1237  LIPASE 47   Recent Labs  Lab 11/06/22 0912  AMMONIA 23   Coagulation Profile: No results for input(s): "INR", "PROTIME" in the last 168 hours. Cardiac Enzymes: No results for input(s): "CKTOTAL", "CKMB", "CKMBINDEX", "TROPONINI" in the last 168 hours. BNP (last 3 results) No results for input(s): "PROBNP" in the last 8760 hours. HbA1C: No results for input(s): "HGBA1C" in the last 72 hours. CBG: Recent Labs  Lab 11/09/22 1200  GLUCAP 101*   Lipid Profile: No results for input(s): "CHOL", "HDL", "LDLCALC", "TRIG", "CHOLHDL", "LDLDIRECT" in the last 72 hours. Thyroid Function Tests: No results for input(s): "TSH", "T4TOTAL", "FREET4", "T3FREE", "THYROIDAB" in the last 72 hours.  Anemia Panel: No results for input(s): "VITAMINB12", "FOLATE", "FERRITIN", "TIBC", "IRON", "RETICCTPCT" in the last 72 hours. Sepsis Labs: No results for input(s): "PROCALCITON", "LATICACIDVEN" in the last 168 hours.  Recent Results (from the past 240 hour(s))  Urine Culture     Status: Abnormal   Collection Time: 11/05/22  2:56 PM   Specimen: Urine, Clean Catch  Result Value Ref Range Status   Specimen Description   Final    URINE, CLEAN CATCH Performed at [facility], [place] [address] [place], [place] [address]    Special Requests   Final    NONE Performed at [facility], [place] [address], [place], [place] [address]    Culture 60,000 COLONIES/mL ENTEROCOCCUS FAECALIS (A)  Final   Report Status 11/07/2022 FINAL  Final   Organism ID, Bacteria ENTEROCOCCUS FAECALIS (A)  Final      Susceptibility   Enterococcus faecalis - MIC*    AMPICILLIN <=2 SENSITIVE Sensitive     NITROFURANTOIN <=16  SENSITIVE Sensitive     VANCOMYCIN 1 SENSITIVE Sensitive     * 60,000 COLONIES/mL ENTEROCOCCUS FAECALIS         Radiology Studies: No results found.      Scheduled Meds:  apixaban  5 mg Oral BID   atorvastatin  20 mg Oral Q M,W,F-2000   Chlorhexidine Gluconate Cloth  6 each Topical Daily   docusate sodium  200 mg Oral QHS   losartan  25 mg Oral Daily   metoCLOPramide  5 mg Oral TID AC   metoprolol tartrate  12.5 mg Oral BID   pantoprazole  40 mg Oral BID   polyethylene glycol  17 g Oral Daily   polyvinyl alcohol  1 drop Both Eyes QHS   tamsulosin  0.4 mg Oral Daily   Continuous Infusions:  thiamine (VITAMIN B1) injection 500 mg (11/09/22 1534)     LOS: 4 days   Time spent= 35  mins    Joseph Simpson Arsenio Loader, Simpson [facility]  If 7PM-7AM, please contact night-coverage  11/10/2022, 10:56 AM

## 2022-11-11 DIAGNOSIS — T83091A Other mechanical complication of indwelling urethral catheter, initial encounter: Secondary | ICD-10-CM | POA: Diagnosis not present

## 2022-11-11 DIAGNOSIS — Z7901 Long term (current) use of anticoagulants: Secondary | ICD-10-CM | POA: Diagnosis not present

## 2022-11-11 DIAGNOSIS — E611 Iron deficiency: Secondary | ICD-10-CM | POA: Diagnosis not present

## 2022-11-11 DIAGNOSIS — N400 Enlarged prostate without lower urinary tract symptoms: Secondary | ICD-10-CM | POA: Diagnosis not present

## 2022-11-11 DIAGNOSIS — E559 Vitamin D deficiency, unspecified: Secondary | ICD-10-CM | POA: Diagnosis not present

## 2022-11-11 DIAGNOSIS — M1380 Other specified arthritis, unspecified site: Secondary | ICD-10-CM | POA: Diagnosis not present

## 2022-11-11 DIAGNOSIS — N139 Obstructive and reflux uropathy, unspecified: Secondary | ICD-10-CM | POA: Diagnosis not present

## 2022-11-11 DIAGNOSIS — N3001 Acute cystitis with hematuria: Secondary | ICD-10-CM | POA: Diagnosis not present

## 2022-11-11 DIAGNOSIS — K219 Gastro-esophageal reflux disease without esophagitis: Secondary | ICD-10-CM | POA: Diagnosis not present

## 2022-11-11 DIAGNOSIS — D649 Anemia, unspecified: Secondary | ICD-10-CM | POA: Diagnosis not present

## 2022-11-11 DIAGNOSIS — R404 Transient alteration of awareness: Secondary | ICD-10-CM | POA: Diagnosis not present

## 2022-11-11 DIAGNOSIS — R2681 Unsteadiness on feet: Secondary | ICD-10-CM | POA: Diagnosis not present

## 2022-11-11 DIAGNOSIS — R338 Other retention of urine: Secondary | ICD-10-CM | POA: Diagnosis not present

## 2022-11-11 DIAGNOSIS — I10 Essential (primary) hypertension: Secondary | ICD-10-CM | POA: Diagnosis not present

## 2022-11-11 DIAGNOSIS — E539 Vitamin B deficiency, unspecified: Secondary | ICD-10-CM | POA: Diagnosis not present

## 2022-11-11 DIAGNOSIS — R4182 Altered mental status, unspecified: Secondary | ICD-10-CM | POA: Diagnosis not present

## 2022-11-11 DIAGNOSIS — H3530 Unspecified macular degeneration: Secondary | ICD-10-CM | POA: Diagnosis not present

## 2022-11-11 DIAGNOSIS — R112 Nausea with vomiting, unspecified: Secondary | ICD-10-CM | POA: Diagnosis not present

## 2022-11-11 DIAGNOSIS — R930 Abnormal findings on diagnostic imaging of skull and head, not elsewhere classified: Secondary | ICD-10-CM | POA: Diagnosis not present

## 2022-11-11 DIAGNOSIS — T83098A Other mechanical complication of other indwelling urethral catheter, initial encounter: Secondary | ICD-10-CM | POA: Diagnosis not present

## 2022-11-11 DIAGNOSIS — N401 Enlarged prostate with lower urinary tract symptoms: Secondary | ICD-10-CM | POA: Diagnosis not present

## 2022-11-11 DIAGNOSIS — E785 Hyperlipidemia, unspecified: Secondary | ICD-10-CM | POA: Diagnosis not present

## 2022-11-11 DIAGNOSIS — I4891 Unspecified atrial fibrillation: Secondary | ICD-10-CM | POA: Diagnosis not present

## 2022-11-11 DIAGNOSIS — E512 Wernicke's encephalopathy: Secondary | ICD-10-CM

## 2022-11-11 DIAGNOSIS — I2510 Atherosclerotic heart disease of native coronary artery without angina pectoris: Secondary | ICD-10-CM | POA: Diagnosis not present

## 2022-11-11 DIAGNOSIS — R413 Other amnesia: Secondary | ICD-10-CM | POA: Diagnosis not present

## 2022-11-11 DIAGNOSIS — K2100 Gastro-esophageal reflux disease with esophagitis, without bleeding: Secondary | ICD-10-CM | POA: Diagnosis not present

## 2022-11-11 DIAGNOSIS — M6259 Muscle wasting and atrophy, not elsewhere classified, multiple sites: Secondary | ICD-10-CM | POA: Diagnosis not present

## 2022-11-11 DIAGNOSIS — R4189 Other symptoms and signs involving cognitive functions and awareness: Secondary | ICD-10-CM | POA: Diagnosis not present

## 2022-11-11 DIAGNOSIS — N3000 Acute cystitis without hematuria: Secondary | ICD-10-CM | POA: Diagnosis not present

## 2022-11-11 DIAGNOSIS — Z7401 Bed confinement status: Secondary | ICD-10-CM | POA: Diagnosis not present

## 2022-11-11 LAB — BASIC METABOLIC PANEL
Anion gap: 7 (ref 5–15)
BUN: 11 mg/dL (ref 8–23)
CO2: 26 mmol/L (ref 22–32)
Calcium: 8.6 mg/dL — ABNORMAL LOW (ref 8.9–10.3)
Chloride: 105 mmol/L (ref 98–111)
Creatinine, Ser: 0.63 mg/dL (ref 0.61–1.24)
GFR, Estimated: >60 mL/min (ref >60)
Glucose, Bld: 106 mg/dL — ABNORMAL HIGH (ref 70–99)
Potassium: 4.3 mmol/L (ref 3.5–5.1)
Sodium: 138 mmol/L (ref 135–145)

## 2022-11-11 LAB — CBC
HCT: 40.9 % (ref 39.0–52.0)
Hemoglobin: 13.7 g/dL (ref 13.0–17.0)
MCH: 32.2 pg (ref 26.0–34.0)
MCHC: 33.5 g/dL (ref 30.0–36.0)
MCV: 96.2 fL (ref 80.0–100.0)
Platelets: 123 10*3/uL — ABNORMAL LOW (ref 150–400)
RBC: 4.25 MIL/uL (ref 4.22–5.81)
RDW: 12.1 % (ref 11.5–15.5)
WBC: 5.6 10*3/uL (ref 4.0–10.5)
nRBC: 0 % (ref 0.0–0.2)

## 2022-11-11 LAB — MAGNESIUM: Magnesium: 2.4 mg/dL (ref 1.7–2.4)

## 2022-11-11 MED ORDER — FLEET ENEMA 7-19 GM/118ML RE ENEM
1.0000 | ENEMA | Freq: Once | RECTAL | Status: AC
  Administered 2022-11-11: 1 via RECTAL
  Filled 2022-11-11: qty 1

## 2022-11-11 NOTE — Discharge Summary (Signed)
[facility]  Physician Discharge Summary   Patient ID: Joseph Simpson MRN: [id number] DOB/AGE: [birth year] 87 y.o.  Admit date: 11/05/2022 Discharge date:   11/11/2022   PCP: Marin Olp, MD  DISCHARGE DIAGNOSES:  Wernicke's encephalopathy Enterococcus UTI Paroxysmal atrial fibrillation Essential hypertension History of BPH with acute urinary retention   RECOMMENDATIONS FOR OUTPATIENT FOLLOW UP: Continue Foley for now.  Patient will need to be referred to urology for voiding trial. Check thiamine level, CBC, basic metabolic panel and magnesium in 1 week   Home Health: Going to SNF Equipment/Devices: None  CODE STATUS: Full code  DISCHARGE CONDITION: fair  Diet recommendation: Heart healthy  INITIAL HISTORY: 87 year old male with history of HTN, HLD, chronic A-fib on Eliquis, CAD, partial gastrectomy and gastrojejunostomy about 40 years ago admitted to the hospital for generalized weakness and nausea vomiting for 1 day.  CT of the abdomen pelvis was unremarkable.  Patient undergone GI endoscopy on 12/18 showing esophageal plaques suspicious for candidiasis.  He has had couple of recent ED visits and hospitalization.  CT of the head does not show any acute pathology.  UA does not show any evidence of active infection but eventually cultures grew Enterococcus therefore received 1 dose of fosfomycin on 12/22.  Patient was also started on thiamine injection due to [place] encephalopathy.  Patient was started on IV thiamine.  Patient's mentation is doing better, PT OT recommended SNF.  Awaiting placement    HOSPITAL COURSE:   Wernicke encephalopathy.  MRI brain shows suggested Wernicke's encephalopathy.  Patient was given IV thiamine.  Ammonia level was normal.  TSH was normal. Mentation has significantly improved.   Nausea with poor appetite and esophagitis Recent endoscopy in 12/18 negative for Candida on biopsy.  Currently on PPI with Reglan.    Enteroccocus  UTI Fosfomycin s/p 1 dose on 12/22   Paroxysmal atrial fibrillation On metoprolol, Eliquis.     Essential hypertension   Hypokalemia/hypomagnesemia Supplemented during hospital stay   History of prostate hypertrophy with acute urinary retention Catheter exchanged.  Will need outpatient urology follow-up for voiding trial.  Continue Flomax.   Patient is stable.  Okay for discharge to SNF.  PERTINENT LABS:  The results of significant diagnostics from this hospitalization (including imaging, microbiology, ancillary and laboratory) are listed below for reference.    Microbiology: Recent Results (from the past 240 hour(s))  Urine Culture     Status: Abnormal   Collection Time: 11/05/22  2:56 PM   Specimen: Urine, Clean Catch  Result Value Ref Range Status   Specimen Description   Final    URINE, CLEAN CATCH Performed at [facility], [place] [address], [place], [place] [address]    Special Requests   Final    NONE Performed at [facility], [place] [address] [place], [place] [address]    Culture 60,000 COLONIES/mL ENTEROCOCCUS FAECALIS (A)  Final   Report Status 11/07/2022 FINAL  Final   Organism ID, Bacteria ENTEROCOCCUS FAECALIS (A)  Final      Susceptibility   Enterococcus faecalis - MIC*    AMPICILLIN <=2 SENSITIVE Sensitive     NITROFURANTOIN <=16 SENSITIVE Sensitive     VANCOMYCIN 1 SENSITIVE Sensitive     * 60,000 COLONIES/mL ENTEROCOCCUS FAECALIS     Labs:   Basic Metabolic Panel: Recent Labs  Lab 11/07/22 0731 11/08/22 0653 11/09/22 0755 11/10/22 0617 11/11/22 0903  NA 137 136 138 137 138  K 3.8 3.8 4.7 3.7 4.3  CL  103 107 105 105 105  CO2 _0 GLUCOSE 88 93 101* 99 106*  BUN 27* 24* _1 CREATININE 0.92 0.68 0.69 0.55* 0.63  CALCIUM 8.7* 8.4* 8.5* 8.6* 8.6*  MG 2.5* 2.4 2.0 2.1 2.4   Liver Function Tests: Recent Labs  Lab 11/05/22 1237  AST 20  ALT 23  ALKPHOS 49  BILITOT 1.3*  PROT 6.1*   ALBUMIN 3.2*   Recent Labs  Lab 11/05/22 1237  LIPASE 47   Recent Labs  Lab 11/06/22 0912  AMMONIA 23   CBC: Recent Labs  Lab 11/05/22 1237 11/06/22 0545 11/07/22 0731 11/08/22 0653 11/09/22 0755 11/10/22 0617 11/11/22 0903  WBC 12.3*   < > 11.8* 7.7 5.6 5.9 5.6  NEUTROABS 10.8*  --   --   --   --   --   --   HGB 17.3*   < > 14.0 13.2 12.8* 12.5* 13.7  HCT 51.1   < > 41.5 39.2 37.6* 37.1* 40.9  MCV 95.0   < > 96.5 94.7 96.9 93.7 96.2  PLT 149*   < > 113* 108* 100* 117* 123*   < > = values in this interval not displayed.     CBG: Recent Labs  Lab 11/09/22 1200  GLUCAP 101*     IMAGING STUDIES MR BRAIN WO CONTRAST  Result Date: 11/06/2022 CLINICAL DATA:  Transient ischemic attack (TIA) EXAM: MRI HEAD WITHOUT CONTRAST TECHNIQUE: Multiplanar, multiecho pulse sequences of the brain and surrounding structures were obtained without intravenous contrast. COMPARISON:  CT head 11/05/2022. FINDINGS: Brain: Abnormal/symmetric T2/FLAIR hyperintensity involving the dorsomedial thalami, mamillary bodies, periaqueductal gray, and tectal plate. No evidence of acute infarct no acute hemorrhage, mass lesion, midline shift or hydrocephalus. Vascular: Major arterial flow voids are maintained at the skull base. Skull and upper cervical spine: Normal marrow signal. Sinuses/Orbits: Clear sinuses.  No acute orbital findings. Other: No mastoid effusions. IMPRESSION: Abnormal/symmetric T2/FLAIR hyperintensity involving the dorsomedial thalami, mamillary bodies, periaqueductal gray, and tectal plate, compatible with Wernicke encephalopathy (alcoholic encephalopathy). Electronically Signed   By: Margaretha Sheffield M.D.   On: 11/06/2022 13:15   CT ABDOMEN PELVIS W CONTRAST  Result Date: 11/05/2022 CLINICAL DATA:  Epigastric pain EXAM: CT ABDOMEN AND PELVIS WITH CONTRAST TECHNIQUE: Multidetector CT imaging of the abdomen and pelvis was performed using the standard protocol following bolus  administration of intravenous contrast. RADIATION DOSE REDUCTION: This exam was performed according to the departmental dose-optimization program which includes automated exposure control, adjustment of the mA and/or kV according to patient size and/or use of iterative reconstruction technique. CONTRAST:  161m OMNIPAQUE IOHEXOL 300 MG/ML  SOLN COMPARISON:  CT 10/29/2022, 10/27/2022 FINDINGS: Lower chest: Lung bases demonstrate no acute consolidation or effusion. Mild lower lobe bronchial wall thickening and subpleural reticulation. Hepatobiliary: No focal liver abnormality is seen. No gallstones, gallbladder wall thickening, or biliary dilatation. Pancreas: Unremarkable. No pancreatic ductal dilatation or surrounding inflammatory changes. Spleen: Normal in size without focal abnormality. Adrenals/Urinary Tract: Adrenal glands are normal. Kidneys show no hydronephrosis. The previously noted right UVJ stone is no longer visualized no bladder assessment is limited by artifact from hip hardware. Catheter in the bladder. Thick-walled appearance of the bladder. Cortical scarring right kidney. Small nonobstructing stones at the lower pole right kidney. Stomach/Bowel: Stomach is moderately distended with fluid. Status post partial gastrectomy with left abdominal gastro jejunostomy. Remainder of small bowel is nondilated. There is no acute bowel wall thickening. There is diverticular disease of  the colon. Vascular/Lymphatic: Moderate aortic atherosclerosis. No aneurysm. No suspicious lymph nodes. Reproductive: Enlarged prostate Other: Negative for pelvic effusion or free air. Musculoskeletal: Left hip replacement with artifact. No acute osseous abnormality. IMPRESSION: 1. Status post partial gastrectomy with gastrojejunostomy. There is moderate fluid distension of the stomach, if outlet obstruction is suspected, correlation with UGI could be considered. Small bowel is otherwise nondistended and there is no evidence for  small bowel obstruction. 2. Thick-walled urinary bladder which is decompressed by Foley catheter. Previously noted right UVJ stone is no longer visualized. Nonobstructing right kidney stones. 3. Diverticular disease of the colon without acute wall thickening Aortic Atherosclerosis (ICD10-I70.0). Electronically Signed   By: Donavan Foil M.D.   On: 11/05/2022 16:33   CT Head Wo Contrast  Result Date: 11/05/2022 CLINICAL DATA:  Altered mental status EXAM: CT HEAD WITHOUT CONTRAST TECHNIQUE: Contiguous axial images were obtained from the base of the skull through the vertex without intravenous contrast. RADIATION DOSE REDUCTION: This exam was performed according to the departmental dose-optimization program which includes automated exposure control, adjustment of the mA and/or kV according to patient size and/or use of iterative reconstruction technique. COMPARISON:  07/23/2011 FINDINGS: Brain: No acute intracranial findings are seen. There are no signs of bleeding within the cranium. Cortical sulci are prominent. There is decreased density in periventricular white matter. There is no focal edema or mass effect. Vascular: Scattered arterial calcifications are seen. Skull: Unremarkable. Sinuses/Orbits: Unremarkable. Other: None. IMPRESSION: No acute intracranial findings are seen in noncontrast CT brain. Atrophy. Small-vessel disease. Electronically Signed   By: Elmer Picker M.D.   On: 11/05/2022 16:20     DISCHARGE EXAMINATION: Vitals:   11/10/22 1352 11/10/22 1755 11/10/22 2242 11/11/22 0502  BP: (!) 84/51 (!) 103/57 (!) 142/82 (!) 140/59  Pulse: (!) 30 (!) 130 86 65  Resp: _0 Temp: (!) 97.3 F (36.3 C) (!) 97.4 F (36.3 C) 98 F (36.7 C) 97.9 F (36.6 C)  TempSrc: Oral Oral  Oral  SpO2: 98% 97% 96% 98%  Weight:      Height:       General appearance: Awake alert.  In no distress Resp: Clear to auscultation bilaterally.  Normal effort Cardio: S1-S2 is normal regular.  No  S3-S4.  No rubs murmurs or bruit GI: Abdomen is soft.  Nontender nondistended.  Bowel sounds are present normal.  No masses organomegaly   DISPOSITION: SNF  Discharge Instructions     Call MD for:  difficulty breathing, headache or visual disturbances   Complete by: As directed    Call MD for:  extreme fatigue   Complete by: As directed    Call MD for:  persistant dizziness or light-headedness   Complete by: As directed    Call MD for:  persistant nausea and vomiting   Complete by: As directed    Call MD for:  severe uncontrolled pain   Complete by: As directed    Call MD for:  temperature >100.4   Complete by: As directed    Discharge instructions   Complete by: As directed    Please review instructions on the discharge summary.  You were cared for by a hospitalist during your hospital stay. If you have any questions about your discharge medications or the care you received while you were in the hospital after you are discharged, you can call the unit and asked to speak with the hospitalist on call if the hospitalist that took care of you is  not available. Once you are discharged, your primary care physician will handle any further medical issues. Please note that NO REFILLS for any discharge medications will be authorized once you are discharged, as it is imperative that you return to your primary care physician (or establish a relationship with a primary care physician if you do not have one) for your aftercare needs so that they can reassess your need for medications and monitor your lab values. If you do not have a primary care physician, you can call [phone] for a physician referral.   Increase activity slowly   Complete by: As directed          Allergies as of 11/11/2022       Reactions   Tape Other (See Comments)   SKIN IS FRAGILE AND WILL TEAR EASILY- PAPER TAPE ONLY   Aspirin Other (See Comments)   "UPSETS THE STOMACH"   Percocet [oxycodone-acetaminophen] Nausea And  Vomiting   Vitamins [apatate] Other (See Comments)   "Bothers the stomach"        Medication List     TAKE these medications    acetaminophen 500 MG tablet Commonly known as: TYLENOL Take 500-1,000 mg by mouth 2 (two) times daily as needed for mild pain, moderate pain or headache.   apixaban 5 MG Tabs tablet Commonly known as: ELIQUIS Take 1 tablet (5 mg total) by mouth 2 (two) times daily.   atorvastatin 20 MG tablet Commonly known as: LIPITOR TAKE ONE TABLET BY MOUTH THREE TIMES A WEEK What changed:  how much to take how to take this when to take this   Benadryl Allergy 25 MG tablet Generic drug: diphenhydrAMINE Take 12.5 mg by mouth See admin instructions. Take 12.5 mg by mouth every 8 hours when taking Prochlorperazine   bevacizumab 2.5 mg/0.1 mL Soln Commonly known as: AVASTIN 2.5 mg by Intravitreal route See admin instructions. Injection to eyes every 4 weeks - use with vigamox   Carboxymethylcellulose Sodium 1 % Gel Place 1 drop into both eyes at bedtime.   Cyanocobalamin 1000 MCG/ML Kit Inject 1,000 mcg as directed every 30 (thirty) days.   docusate sodium 100 MG capsule Commonly known as: COLACE Take 200 mg by mouth at bedtime.   losartan 25 MG tablet Commonly known as: COZAAR Take 0.5-1 tablets (12.5-25 mg total) by mouth daily. If blood pressure over 145 when you check- ok to take other half tablet once per day (so total of 96m per day) What changed:  how much to take when to take this additional instructions   metoCLOPramide 5 MG tablet Commonly known as: REGLAN Take 1 tablet (5 mg total) by mouth 3 (three) times daily before meals for 14 days.   metoprolol tartrate 25 MG tablet Commonly known as: LOPRESSOR Take 12.5 mg by mouth 2 (two) times daily.   pantoprazole 40 MG tablet Commonly known as: PROTONIX Take 1 tablet (40 mg total) by mouth 2 (two) times daily.   polyethylene glycol 17 g packet Commonly known as: MIRALAX / GLYCOLAX Take  17 g by mouth daily as needed for moderate constipation (mix and drink as directed).   prochlorperazine 10 MG tablet Commonly known as: COMPAZINE Take 1 tablet (10 mg total) by mouth every 8 (eight) hours as needed for nausea or vomiting (take with 12.574mbenadryl please).   SYSTANE BALANCE OP Place 1 drop into both eyes in the morning and at bedtime.   tamsulosin 0.4 MG Caps capsule Commonly known as: FLOMAX Take 0.4 mg  by mouth daily.   thiamine 100 MG tablet Commonly known as: VITAMIN B1 Take 1 tablet (100 mg total) by mouth daily.   Vigamox 0.5 % ophthalmic solution Generic drug: moxifloxacin Place 1 drop into both eyes See admin instructions. Instill every 4 weeks as directed - use with avastin, instill 1 drop into both eyes 4 times daily for 2 days after eye injections          Contact information for follow-up providers     Marin Olp, MD Follow up in 1 week(s).   Specialty: Family Medicine Contact information: [address] [place] [address] [phone]              Contact information for after-discharge care     [facility] .   Service: Skilled Nursing Contact information: [address] [place] [phone]                     TOTAL DISCHARGE TIME: 59 minutes  [facility] Pager on [email or web address]  11/11/2022, 11:15 AM

## 2022-11-11 NOTE — TOC Transition Note (Addendum)
Transition of Care [facility]) - CM/SW Discharge Note   Patient Details  Name: Joseph Simpson MRN: [id number] Date of Birth: 1935/07/07  Transition of Care [facility]) CM/SW Contact:  Illene Regulus, LCSW Phone Number: 11/11/2022, 11:29 AM   Clinical Narrative:     CSW spoke with [place] pt is able to d/c to facility today if pt has a BM. Pt's room assignment is 103, call reported [phone]. TOC to follow.   [place]  Pt can now d/c spoke with pt's family they are requesting EMS transport. PTAR called. No additional TOC needs    Barriers to Discharge: Continued Medical Work up, SNF Pending bed offer   Patient Goals and CMS Choice      Discharge Placement                         Discharge Plan and Services Additional resources added to the After Visit Summary for   In-house Referral: Clinical Social Work   Post Acute Care Choice: [place]          DME Arranged: N/A DME Agency: NA                  Social Determinants of Health ([place]) Interventions [place]: No Food Insecurity (11/05/2022)  Housing: Low Risk  (11/05/2022)  Transportation Needs: No Transportation Needs (11/05/2022)  Utilities: Not At Risk (11/05/2022)  Depression (PHQ2-9): Low Risk  (09/21/2022)  Financial Resource Strain: Low Risk  (09/17/2022)  Physical Activity: Sufficiently Active (09/17/2022)  Social Connections: Moderately Isolated (09/17/2022)  Stress: No Stress Concern Present (09/17/2022)  Tobacco Use: Medium Risk (11/09/2022)     Readmission Risk Interventions     No data to display

## 2022-11-11 NOTE — Consult Note (Signed)
   [facility] Inpatient Consult   11/11/2022  Joseph Simpson 06/04/1935 [id number]  [place] [facility] [ACO] Patient: Medicare ACO REACH   Primary Care Provider:  Marin Olp, MD with [place] [facility] is listed to provide the transition of care follow up  [facility] Liaison for patient readmitted to [facility] in less than 7 days   Patient is being recommended for a skilled nursing facility level of care for transition. If patient goes to a [facility] affiliated facility will alert [facility] RN PAC     Of note, [facility] Care Management services does not replace or interfere with any services that are arranged by inpatient case management or social work.  For additional questions or referrals please contact:    Natividad Brood, RN BSN [place]  [phone] business mobile phone Toll free office [phone]  *[place]  [phone] Fax number: [phone] [email or web address]

## 2022-11-11 NOTE — Plan of Care (Addendum)
Pt D/C to SNF [facility]. IV and Tele removed. Report called. BP (!) 140/59 (BP Location: Left Arm)   Pulse 65   Temp 97.9 F (36.6 C) (Oral)   Resp 18   Ht '6\' 1"'$  (1.854 m)   Wt 76.8 kg   SpO2 98%   BMI 22.34 kg/m  Attempted to call report and had a voicemail box taht was full. Louanne Skye 11/11/22 1:57 PM

## 2022-11-12 ENCOUNTER — Other Ambulatory Visit: Payer: Self-pay | Admitting: Family Medicine

## 2022-11-12 DIAGNOSIS — N400 Enlarged prostate without lower urinary tract symptoms: Secondary | ICD-10-CM | POA: Diagnosis not present

## 2022-11-12 DIAGNOSIS — E512 Wernicke's encephalopathy: Secondary | ICD-10-CM | POA: Diagnosis not present

## 2022-11-12 DIAGNOSIS — K219 Gastro-esophageal reflux disease without esophagitis: Secondary | ICD-10-CM | POA: Diagnosis not present

## 2022-11-12 DIAGNOSIS — I10 Essential (primary) hypertension: Secondary | ICD-10-CM | POA: Diagnosis not present

## 2022-11-17 ENCOUNTER — Encounter: Payer: Self-pay | Admitting: Gastroenterology

## 2022-11-17 DIAGNOSIS — N3000 Acute cystitis without hematuria: Secondary | ICD-10-CM | POA: Diagnosis not present

## 2022-11-17 DIAGNOSIS — R338 Other retention of urine: Secondary | ICD-10-CM | POA: Diagnosis not present

## 2022-11-17 DIAGNOSIS — N401 Enlarged prostate with lower urinary tract symptoms: Secondary | ICD-10-CM | POA: Diagnosis not present

## 2022-11-19 ENCOUNTER — Ambulatory Visit: Payer: Medicare Other | Admitting: Physician Assistant

## 2022-11-19 ENCOUNTER — Inpatient Hospital Stay: Payer: Medicare Other | Admitting: Family Medicine

## 2022-11-19 NOTE — Progress Notes (Signed)
Assessment/Plan:    The patient is seen in neurologic consultation at the request of [name], MD for the evaluation of  recent acute confusion.  Joseph Simpson is a very pleasant 88 y.o. year old RH male with HTN, HLD, hard of hearing, P A-fib on Eliquis, CAD, B12 deficiency,  Hyperglycemia/preDM, polyneuropathy, macular degeneration, ILD and OSA not on CPAP per chart, s/p partial gastrectomy and gastrojejunostomy about 40 years ago, and a recent episode of confusion post op  ( endoscopy 11/02/22 ).  After discharge from the hospital , then readmitted with confusion. Personally reviewed CT head was negative for acute pathology. MRI brain was suggestive of Wernicke's encephalopathy, non alcoholic. Of note, he has a history of BPH with acute urinary retention, dc with catheter and Flomax. Urine Culture was positive for Enterococcus faecalis treated with fosfomycin 11/06/22 and to be followed by Urology.   Ammonia and TSH were normal. His bilirubin was slightly elevated.  He was given Thiamine injection/IV with improvement of mentation and discharged to [facility] 12/27, with PT/ OT/ ST. His confusion has improved since discharge. Current MMSE is 20/30. Will need to reassess in a few weeks to determine any chronic memory loss.   Memory Impairment   Abnormal MRI suspicious for Wernicke's Encephalopathy Transient alteration of awareness  Check EEG Check B1, B12, anemia panel Folllow up 2-3 weeks to better evaluate his mental status, as he has recently been discharged from hospital.  Recommend good control of cardiovascular risk factors.  Follow with Cardiology Continue to control mood as per PCP Continue PT/OT   Subjective:    The patient is accompanied by his daughter and son who supplement the history.   How long did patient have memory difficulties?  Prior to last 2 admissions with GI issues and encephalopathy, his daughter reports that he did not have any memory issues. "He was very  sharp". Status during the last admission was complicated with E. Faecalis, metabolic abnormalities (hypokalemia and hypomagnesemia). No alcohol history, confirmed by family. He was found to have abnormal MRI compatible with Wernicke's Encephalopathy. He was given IV Thiamine as well. His overall status is improving since discharge to SNF. He reports that prior to this admission he could have a normal conversation, able to remember names and denies repeating himself.  Disoriented when walking into a room?  Patient denies  Leaving objects in unusual places?   denies   Wandering behavior? denies   Any personality changes prior to hospitalization with confusion? Denies."He was doing well". Any depression?:  denies   Hallucinations or paranoia? denies   Seizures?   denies    Any sleep changes?  Denies  vivid dreams, REM behavior or sleepwalking   Sleep apnea? Denies, although per chart there is OSA documentation.    Any hygiene concerns?  denies   Independent of bathing and dressing?  Endorsed  Does the patient need help with medications? Daughter supervises   Who is in charge of the finances?  Family supervises  Any changes in appetite?  HE was recently hospitalized, thus appetite has "not been great". During last admission he had nausea and vomiting.      Patient have trouble swallowing?  denies   Does the patient cook? No   Any headaches?  denies   Chronic back pain  denies   Ambulates with difficulty?  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 Today he is on a wheelchair post hospitalization, he is recuperating,  feels weak.  Recent falls or head injuries?  denies     Unilateral weakness, numbness or tingling?  denies   Any tremors?  denies   Any anosmia?  denies   Any incontinence of urine? Recent UTI.  retention in the setting of BPH, has a catheter and he is on Flomax, will be followed by Urology.  Any bowel dysfunction?  denies      Patient lives  currently  on SNF  History of heavy alcohol intake? denies   History of heavy tobacco use?   denies   Family history of dementia?   Denies  Lives by himself. Was very independent.  Retired [employer] driver  MRI brain 16/01/09 personally reviewed : Abnormal/symmetric T2/FLAIR hyperintensity involving the dorsomedial thalami, mamillary bodies, periaqueductal gray, and tectal plate, compatible with Wernicke encephalopathy   Pertinent labs TSH 1.8, A1C 5.7,   Allergies  Allergen Reactions   Tape Other (See Comments)    SKIN IS FRAGILE AND WILL TEAR EASILY- PAPER TAPE ONLY   Aspirin Other (See Comments)    "UPSETS THE STOMACH"   Percocet [Oxycodone-Acetaminophen] Nausea And Vomiting   Vitamins [Apatate] Other (See Comments)    "Bothers the stomach"    Current Outpatient Medications  Medication Instructions   acetaminophen (TYLENOL) 500-1,000 mg, Oral, 2 times daily PRN   apixaban (ELIQUIS) 5 mg, Oral, 2 times daily   atorvastatin (LIPITOR) 20 MG tablet TAKE ONE TABLET BY MOUTH THREE TIMES A WEEK   Benadryl Allergy 12.5 mg, Oral, See admin instructions, Take 12.5 mg by mouth every 8 hours when taking Prochlorperazine   bevacizumab (AVASTIN) 2.5 mg, Intravitreal, See admin instructions, Injection to eyes every 4 weeks - use with vigamox   Carboxymethylcellulose Sodium 1 % GEL 1 drop, Both Eyes, Daily at bedtime   Cyanocobalamin 1,000 mcg, Injection, Every 30 days   docusate sodium (COLACE) 200 mg, Oral, Daily at bedtime   losartan (COZAAR) 12.5-25 mg, Oral, Daily, If blood pressure over 145 when you check- ok to take other half tablet once per day (so total of '25mg'$  per day)   metoCLOPramide (REGLAN) 5 mg, Oral, 3 times daily before meals   metoprolol tartrate (LOPRESSOR) 12.5 mg, Oral, 2 times daily   pantoprazole (PROTONIX) 40 mg, Oral, 2 times daily   polyethylene glycol (MIRALAX / GLYCOLAX) 17 g, Oral, Daily PRN   prochlorperazine (COMPAZINE) 10 mg, Oral, Every 8 hours PRN   Propylene Glycol  (SYSTANE BALANCE OP) 1 drop, Both Eyes, 2 times daily   tamsulosin (FLOMAX) 0.4 mg, Oral, Daily   thiamine (VITAMIN B1) 100 mg, Oral, Daily   VIGAMOX 0.5 % ophthalmic solution 1 drop, Both Eyes, See admin instructions, Instill every 4 weeks as directed - use with avastin, instill 1 drop into both eyes 4 times daily for 2 days after eye injections     VITALS:   Vitals:   11/20/22 0814  BP: (!) 167/86  Pulse: 72  Resp: 20  SpO2: 98%  Height: '6\' 1"'$  (1.854 m)    PHYSICAL EXAM   HEENT:  Normocephalic, atraumatic. The mucous membranes are moist, there are some areas of ecchymoses of unclear etiology (he denies tongue biting) . The superficial temporal arteries are without ropiness or tenderness. Cardiovascular: Regular rate and rhythm. Lungs: Clear to auscultation bilaterally. Neck: There are no carotid bruits noted bilaterally.  NEUROLOGICAL:     No data to display             11/21/2022    9:00  AM  MMSE - Mini Mental State Exam  Orientation to time 1  Orientation to Place 5  Registration 1  Attention/ Calculation 5  Recall 0  Language- name 2 objects 2  Language- repeat 1  Language- follow 3 step command 3  Language- read & follow direction 1  Write a sentence 0  Copy design 1  Total score 20     Orientation:  Alert and oriented to person, place and not to time. No aphasia or dysarthria. Fund of knowledge is appropriate. Recent memory impaired and remote memory intact.  Attention and concentration are normal.  Able to name objects and repeat phrases. Delayed recall   Cranial nerves: There is good facial symmetry. Extraocular muscles are intact and visual fields are full to confrontational testing. Speech is fluent and clear. no tongue deviation. Hearing is intact to conversational tone. Tone: Tone is good throughout. Sensation: Sensation is intact to light touch . Vibration is intact at the bilateral big toe.There is no extinction with double simultaneous stimulation.  There is no sensory dermatomal level identified. Coordination: The patient has no difficulty with RAM's or FNF bilaterally. Normal finger to nose  Motor: Strength is 5/5 in the bilateral upper and lower extremities. There is no pronator drift. There are no fasciculations noted. DTR's: Deep tendon reflexes are 1/4 at the bilateral biceps, triceps, brachioradialis, patella and achilles.  Plantar responses are downgoing bilaterally. Gait and Station: The patient is on a wheelchair at this time gait not tested.      Thank you for allowing [place] the opportunity to participate in the care of this nice patient. Please do not hesitate to contact us for any questions or concerns.   Total time spent on today's visit was 60 minutes dedicated to this patient today, preparing to see patient, examining the patient, ordering tests and/or medications and counseling the patient, documenting clinical information in the EHR or other health record, independently interpreting results and communicating results to the patient/family, discussing treatment and goals, answering patient's questions and coordinating care.  Cc:  Marin Olp, MD  Sharene Butters 11/21/2022 10:23 AM

## 2022-11-20 ENCOUNTER — Encounter: Payer: Self-pay | Admitting: Physician Assistant

## 2022-11-20 ENCOUNTER — Ambulatory Visit (INDEPENDENT_AMBULATORY_CARE_PROVIDER_SITE_OTHER): Payer: Medicare Other | Admitting: Physician Assistant

## 2022-11-20 ENCOUNTER — Other Ambulatory Visit (INDEPENDENT_AMBULATORY_CARE_PROVIDER_SITE_OTHER): Payer: PRIVATE HEALTH INSURANCE

## 2022-11-20 VITALS — BP 167/86 | HR 72 | Resp 20 | Ht 73.0 in

## 2022-11-20 DIAGNOSIS — R404 Transient alteration of awareness: Secondary | ICD-10-CM

## 2022-11-20 DIAGNOSIS — E611 Iron deficiency: Secondary | ICD-10-CM

## 2022-11-20 DIAGNOSIS — R930 Abnormal findings on diagnostic imaging of skull and head, not elsewhere classified: Secondary | ICD-10-CM | POA: Diagnosis not present

## 2022-11-20 DIAGNOSIS — D649 Anemia, unspecified: Secondary | ICD-10-CM | POA: Diagnosis not present

## 2022-11-20 DIAGNOSIS — R413 Other amnesia: Secondary | ICD-10-CM

## 2022-11-20 LAB — FOLATE: Folate: 9.3 ng/mL (ref >5.9)

## 2022-11-20 LAB — VITAMIN B12: Vitamin B-12: 564 pg/mL (ref 211–911)

## 2022-11-20 NOTE — Patient Instructions (Addendum)
Check EEG for seizures Check iron studies B1 Follw in 1 month  Labs today [address]

## 2022-11-21 LAB — IRON,TIBC AND FERRITIN PANEL
%SAT: 21 % (calc) (ref 20–48)
Ferritin: 90 ng/mL (ref 24–380)
Iron: 56 ug/dL (ref 50–180)
TIBC: 264 mcg/dL (calc) (ref 250–425)

## 2022-11-23 ENCOUNTER — Other Ambulatory Visit: Payer: Medicare Other

## 2022-11-23 ENCOUNTER — Ambulatory Visit (INDEPENDENT_AMBULATORY_CARE_PROVIDER_SITE_OTHER): Payer: Medicare Other | Admitting: Neurology

## 2022-11-23 DIAGNOSIS — R404 Transient alteration of awareness: Secondary | ICD-10-CM

## 2022-11-23 NOTE — Progress Notes (Signed)
EEG complete - results pending 

## 2022-11-24 ENCOUNTER — Ambulatory Visit: Payer: Medicare Other

## 2022-11-24 LAB — VITAMIN B1: Vitamin B1 (Thiamine): 70 nmol/L — ABNORMAL HIGH (ref 8–30)

## 2022-11-30 ENCOUNTER — Telehealth: Payer: Self-pay

## 2022-11-30 DIAGNOSIS — E559 Vitamin D deficiency, unspecified: Secondary | ICD-10-CM | POA: Diagnosis not present

## 2022-11-30 DIAGNOSIS — I10 Essential (primary) hypertension: Secondary | ICD-10-CM | POA: Diagnosis not present

## 2022-11-30 DIAGNOSIS — N400 Enlarged prostate without lower urinary tract symptoms: Secondary | ICD-10-CM | POA: Diagnosis not present

## 2022-11-30 NOTE — Telephone Encounter (Signed)
No improvement at all congitive decline. In skilled care, she is concerned the POA, can you see EEG, she wants more information.

## 2022-11-30 NOTE — Telephone Encounter (Signed)
Thayer Headings called in stating that they seen Joseph Simpson on 1/5 and she told the family that they were going to consult with the other neurologist to decide next course. Patients daughter said she hasn't heard back from anyone and said he isnt getting any better.

## 2022-12-01 ENCOUNTER — Telehealth: Payer: Self-pay | Admitting: Pharmacist

## 2022-12-01 DIAGNOSIS — R338 Other retention of urine: Secondary | ICD-10-CM | POA: Diagnosis not present

## 2022-12-01 NOTE — Telephone Encounter (Signed)
Left message to call office at 10:13am 12/01/2022

## 2022-12-01 NOTE — Procedures (Signed)
ELECTROENCEPHALOGRAM REPORT  Date of Study: 11/23/2022  Patient's Name: Joseph Simpson MRN: [id number] Date of Birth: 1935/07/10  Referring Provider: Sharene Butters, PA-C  Clinical History: This is an 87 year old man with memory loss, episodes of confusion. EEG for classification.  Medications: Eliquis, Lipitor, Avastin, Cozaar, Metoprolol, Flomax, Thiamine  Technical Summary: A multichannel digital 1-hour EEG recording measured by the international 10-20 system with electrodes applied with paste and impedances below 5000 ohms performed in our laboratory with EKG monitoring in an awake and asleep patient.  Hyperventilation and photic stimulation were not performed.  The digital EEG was referentially recorded, reformatted, and digitally filtered in a variety of bipolar and referential montages for optimal display.    Description: The patient is awake and asleep during the recording.  During maximal wakefulness, there is a symmetric, medium voltage 9 Hz posterior dominant rhythm that attenuates with eye opening.  The record is symmetric.  During drowsiness and sleep, there is an increase in theta slowing of the background with occasional vertex waves seen. There were no epileptiform discharges or electrographic seizures seen.    EKG lead showed frequent extrasystolic beats.  Impression: This 1-hour awake and asleep EEG is normal.    Clinical Correlation: A normal EEG does not exclude a clinical diagnosis of epilepsy.  If further clinical questions remain, prolonged EEG may be helpful.  Clinical correlation is advised.   Ellouise Newer, M.D.

## 2022-12-01 NOTE — Progress Notes (Signed)
Care Management & Coordination Services Pharmacy Team  Reason for Encounter: General adherence update   Attempted to contact patient on 12/01/2022 for general disease state and medication adherence call.   Recent office visits:  None  Recent consult visits:  11/20/2022 OV (Neurology) Rondel Jumbo, PA-C; no medication changes indicated.  09/28/2022 OV (Cardiology) [place], Peter M, MD; no medication changes indicated.  Hospital visits:  11/05/2022 ED to Hospital Admission due to Delirium Admit date: 11/05/2022 Discharge date:   11/11/2022 -No medication changes noted  10/29/2022 ED to Hospital Admission due to Nausea and Vomiting EGD on 11/02/2022  10/27/2022 ED visit due to Nausea and Vomiting discharged with a small amount of Compazine for nausea vomiting, and return precautions were emphasized.   Medications: Outpatient Encounter Medications as of 12/01/2022  Medication Sig   acetaminophen (TYLENOL) 500 MG tablet Take 500-1,000 mg by mouth 2 (two) times daily as needed for mild pain, moderate pain or headache.   apixaban (ELIQUIS) 5 MG TABS tablet Take 1 tablet (5 mg total) by mouth 2 (two) times daily.   atorvastatin (LIPITOR) 20 MG tablet TAKE ONE TABLET BY MOUTH THREE TIMES A WEEK   BENADRYL ALLERGY 25 MG tablet Take 12.5 mg by mouth See admin instructions. Take 12.5 mg by mouth every 8 hours when taking Prochlorperazine   bevacizumab (AVASTIN) 2.5 mg/0.1 mL SOLN 2.5 mg by Intravitreal route See admin instructions. Injection to eyes every 4 weeks - use with vigamox   Carboxymethylcellulose Sodium 1 % GEL Place 1 drop into both eyes at bedtime.   Cyanocobalamin 1000 MCG/ML KIT Inject 1,000 mcg as directed every 30 (thirty) days.   docusate sodium (COLACE) 100 MG capsule Take 200 mg by mouth at bedtime.   losartan (COZAAR) 25 MG tablet Take 0.5-1 tablets (12.5-25 mg total) by mouth daily. If blood pressure over 145 when you check- ok to take other half tablet once per day (so  total of '25mg'$  per day) (Patient taking differently: Take 12.5 mg by mouth See admin instructions. Take 12.5 mg by mouth once a day and if Systolic B/P number is over 145 when checked, may take an additional 12.5 mg once a day (max total of 25 mg/day))   metoCLOPramide (REGLAN) 5 MG tablet Take 1 tablet (5 mg total) by mouth 3 (three) times daily before meals for 14 days.   metoprolol tartrate (LOPRESSOR) 25 MG tablet Take 12.5 mg by mouth 2 (two) times daily.   pantoprazole (PROTONIX) 40 MG tablet Take 1 tablet (40 mg total) by mouth 2 (two) times daily.   polyethylene glycol (MIRALAX / GLYCOLAX) 17 g packet Take 17 g by mouth daily as needed for moderate constipation (mix and drink as directed).   prochlorperazine (COMPAZINE) 10 MG tablet Take 1 tablet (10 mg total) by mouth every 8 (eight) hours as needed for nausea or vomiting (take with 12.'5mg'$  benadryl please).   Propylene Glycol (SYSTANE BALANCE OP) Place 1 drop into both eyes in the morning and at bedtime.   tamsulosin (FLOMAX) 0.4 MG CAPS capsule Take 0.4 mg by mouth daily.   thiamine (VITAMIN B1) 100 MG tablet Take 1 tablet (100 mg total) by mouth daily.   VIGAMOX 0.5 % ophthalmic solution Place 1 drop into both eyes See admin instructions. Instill every 4 weeks as directed - use with avastin, instill 1 drop into both eyes 4 times daily for 2 days after eye injections   No facility-administered encounter medications on file as of 12/01/2022.  Recent vitals BP Readings from Last 3 Encounters:  11/20/22 (!) 167/86  11/11/22 126/77  11/03/22 110/78   Pulse Readings from Last 3 Encounters:  11/20/22 72  11/11/22 73  11/03/22 68   Wt Readings from Last 3 Encounters:  11/09/22 169 lb 5 oz (76.8 kg)  10/29/22 172 lb (78 kg)  09/28/22 173 lb 6.4 oz (78.7 kg)   BMI Readings from Last 3 Encounters:  11/20/22 22.34 kg/m  11/09/22 22.34 kg/m  10/29/22 22.69 kg/m    Recent lab results    Component Value Date/Time   NA 138  11/11/2022 0903   K 4.3 11/11/2022 0903   CL 105 11/11/2022 0903   CO2 26 11/11/2022 0903   GLUCOSE 106 (H) 11/11/2022 0903   BUN 11 11/11/2022 0903   CREATININE 0.63 11/11/2022 0903   CREATININE 0.80 06/19/2020 1033   CALCIUM 8.6 (L) 11/11/2022 0903    Lab Results  Component Value Date   CREATININE 0.63 11/11/2022   GFR 82.10 09/21/2022   GFRNONAA >60 11/11/2022   GFRAA >60 12/15/2017   Lab Results  Component Value Date/Time   HGBA1C 5.7 09/21/2022 09:57 AM   HGBA1C 5.8 01/27/2022 11:14 AM    Lab Results  Component Value Date   CHOL 113 01/27/2022   HDL 37.30 (L) 01/27/2022   LDLCALC 49 01/27/2022   LDLDIRECT 83 06/19/2020   TRIG 132.0 01/27/2022   CHOLHDL 3 01/27/2022     Unsuccessful attempt at reaching patient to complete this call.   Care Gaps: Annual wellness visit in last year? Yes  Star Rating Drugs:  Atorvastatin 20 mg last filled 11/12/2022 90 DS Losartan 25 mg last filled 11/13/2022 30 DS  Future Appointments  Date Time Provider [place]  12/08/2022 10:30 AM Levin Erp, PA [facility]  12/22/2022  9:00 AM Rondel Jumbo, PA-C [facility] None  03/22/2023  9:20 AM Marin Olp, MD [facility]  03/29/2023 12:00 PM Edythe Clarity, [place] None    April D Calhoun, [place] Pharmacist Assistant [phone]

## 2022-12-01 NOTE — Telephone Encounter (Signed)
Pls let them know the EEG was normal. F/u as scheduled with Clarise Cruz, thanks

## 2022-12-01 NOTE — Telephone Encounter (Signed)
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 They want aswers to what he is actually Dx'ed with. Wants an earlier visit, also requesting another MRI to be ordered

## 2022-12-02 ENCOUNTER — Other Ambulatory Visit: Payer: Self-pay

## 2022-12-02 NOTE — Progress Notes (Signed)
MRI brain with and without contrast and MRV head for abnormal MRI brain

## 2022-12-02 NOTE — Telephone Encounter (Signed)
Wernicke's encepalopathy is the likely the diagnosis per you. She wants more information. Would like to discuss sooner, or phone call. This will have to be handled by you. Its past my scope of practice.

## 2022-12-02 NOTE — Telephone Encounter (Signed)
Will call at some point, we have a heavy schedule, but will try a 5 min call, please tell dtr to keep phone on. Thanks

## 2022-12-02 NOTE — Progress Notes (Signed)
Close encounter 

## 2022-12-03 ENCOUNTER — Encounter: Payer: Self-pay | Admitting: Physician Assistant

## 2022-12-07 DIAGNOSIS — E559 Vitamin D deficiency, unspecified: Secondary | ICD-10-CM | POA: Diagnosis not present

## 2022-12-07 DIAGNOSIS — I10 Essential (primary) hypertension: Secondary | ICD-10-CM | POA: Diagnosis not present

## 2022-12-08 ENCOUNTER — Encounter: Payer: Self-pay | Admitting: Physician Assistant

## 2022-12-08 ENCOUNTER — Ambulatory Visit (INDEPENDENT_AMBULATORY_CARE_PROVIDER_SITE_OTHER): Payer: Medicare Other | Admitting: Physician Assistant

## 2022-12-08 ENCOUNTER — Telehealth: Payer: Self-pay | Admitting: Anesthesiology

## 2022-12-08 VITALS — BP 126/78 | HR 84 | Ht 73.0 in

## 2022-12-08 DIAGNOSIS — R112 Nausea with vomiting, unspecified: Secondary | ICD-10-CM

## 2022-12-08 DIAGNOSIS — K2100 Gastro-esophageal reflux disease with esophagitis, without bleeding: Secondary | ICD-10-CM | POA: Diagnosis not present

## 2022-12-08 DIAGNOSIS — R404 Transient alteration of awareness: Secondary | ICD-10-CM

## 2022-12-08 NOTE — Telephone Encounter (Signed)
Essentially, the MRI shows the same findings compatible with Wernicke encephalopathy. Did we order a W and without as well? Because this is the only results I have. Thanks

## 2022-12-08 NOTE — Progress Notes (Signed)
Chief Complaint: Hospital follow-up dysphagia  HPI:    Joseph Simpson is an 88 year old male with a past medical history as listed below including A-fib on Elquis, CAD, gastroparesis, hiatal hernia, OSA and multiple others, known to Dr. Havery Moros, who presents to clinic today for follow-up after recent specialization for dysphagia.    11/02/2022-11/03/2022 patient followed by our service for early satiety, nausea and vomiting.  CTAP unremarkable in the ED.  Patient improved with Pantoprazole 40 twice daily and Reglan 5 mg every 12.  At time of discharge patient was continued on Pantoprazole 40 twice daily long-term and Metoclopramide 5 mg 3 times daily AC for 2 weeks and then stop.  Discussed not advancing beyond soft foods.    11/02/2022 EGD with Dr. Fuller Plan at the hospital for persistent nausea/vomiting and epigastric abdominal pain with findings of esophageal plaques suspicious for candidiasis, LA grade C reflux esophagitis, patent Billroth II gastrojejunostomy with erythema, congestion and decreased opening size but did not appear to be obstructing, gastritis and a large amount of food residue in the stomach with a small hiatal hernia.  Pathology showed gastric antral and oxyntic mucosa with features of reactive gastropathy negative for H. pylori, acute esophagitis with no fungal organisms.    11/05/2022 patient went back into the hospital for delirium.  He had a UTI.    Today, patient presents to clinic accompanied by his son from his assisted living facility.  He has no GI complaints or concerns.  He is on MiraLAX and a stool softener which keeps him regular and he has had no trouble with nausea vomiting or abdominal pain.  Continues on Pantoprazole 40 twice daily.    Denies fever, chills or weight loss.  GI history: 07/17/2022 office visit with Dr. Havery Moros: At that time discussed history of peptic ulcer disease status post Billroth II, GI bleed secondary to esophagitis, and at that time patient was  on Protonix 40 mg twice daily as well as Reglan 5 mg twice a day; plan: At that time discussed repeat EGD though patient was high risk and wish to avoid any endoscopic procedures he was continued on Keppra tonics and Reglan 01/06/2022 EGD with Dr. Paulita Fujita: LA grade D (1 or more mucosal breaks involving at least 75% of esophageal circumference) esophagitis was found, diffuse moderate mucosal changes characterized by congestion found in the entire esophagus, significant mucus buildup in the distal esophagus without food impaction, Billroth II anatomy, diffuse moderately erythematous mucosa without bleeding in the entire examined remnant stomach, some gastric ulcerations and edema 2015 colonoscopy: Few small polyps  Past Medical History:  Diagnosis Date   Aortic atherosclerosis ([place])    Arthritis    Atrial fibrillation ([place])    B12 deficiency    Bladder calculi    CAD (coronary artery disease)    cardiologist--- dr Martinique;   abnormal nuclear stress test 11/ 2013;  s/p cardiac cath 10-21-2012 minimal nonobstructiive CAD w/ normal LVF   Diverticulosis    Dyslipidemia    Esophagitis    Full dentures    Gastroparesis    followed by [name] PA ( [facility] in [place]) s/p gastrectomy for ulcers in 1970s   GERD (gastroesophageal reflux disease)    Heart murmur    Hiatal hernia    History of adenomatous polyp of colon    History of kidney stones    History of melanoma 2008   s/p left forearn wide local excision 06/ 2008   History of syncope 2010  cardiologist --- dr Martinique;  (11-25-2021 per pt no syncope or near syncope since)   Hypertension    Macular degeneration of both eyes    Nocturia associated with benign prostatic hyperplasia    OSA (obstructive sleep apnea)    no cpap   Polyneuropathy    PVC's (premature ventricular contractions) 2010   06/ 2010 per event monitor SR w/ occasional PVCs and 4 beat SVT (in epic)   Wears glasses     Past Surgical History:  Procedure  Laterality Date   APPENDECTOMY     child   BIOPSY  11/02/2022   Procedure: BIOPSY;  Surgeon: Ladene Artist, MD;  Location: [facility];  Service: Gastroenterology;;   CARDIAC CATHETERIZATION  10/2012   minimal nonobstructive CAD with normal LV function   CATARACT EXTRACTION W/ INTRAOCULAR LENS IMPLANT Bilateral    2009 approx   CYSTOSCOPY WITH LITHOLAPAXY N/A 07/13/2016   Procedure: CYSTOSCOPY WITH LITHOLAPAXY;  Surgeon: Franchot Gallo, MD;  Location: [facility];  Service: Urology;  Laterality: N/A;   CYSTOSCOPY WITH LITHOLAPAXY N/A 12/01/2021   Procedure: CYSTOSCOPY WITH LITHOLAPAXY;  Surgeon: Franchot Gallo, MD;  Location: [facility];  Service: Urology;  Laterality: N/A;   CYSTOSCOPY/RETROGRADE/URETEROSCOPY/STONE EXTRACTION WITH BASKET  02/24/2012   Procedure: CYSTOSCOPY/RETROGRADE/URETEROSCOPY/STONE EXTRACTION WITH BASKET;  Surgeon: Franchot Gallo, MD;  Location: [facility];  Service: Urology;  Laterality: Bilateral;  1 HOUR   ESOPHAGOGASTRODUODENOSCOPY N/A 01/06/2022   Procedure: ESOPHAGOGASTRODUODENOSCOPY (EGD);  Surgeon: Arta Silence, MD;  Location: [facility];  Service: Gastroenterology;  Laterality: N/A;   ESOPHAGOGASTRODUODENOSCOPY (EGD) WITH PROPOFOL N/A 11/02/2022   Procedure: ESOPHAGOGASTRODUODENOSCOPY (EGD) WITH PROPOFOL;  Surgeon: Ladene Artist, MD;  Location: [facility];  Service: Gastroenterology;  Laterality: N/A;   EXTRACORPOREAL SHOCK WAVE LITHOTRIPSY  11/16/2011   '@[facility]'$    GASTRECTOMY     1970s  for ulcers   HEMORRHOID SURGERY     yrs ago   HOLMIUM LASER APPLICATION N/A 02/11/9241   Procedure: HOLMIUM LASER APPLICATION;  Surgeon: Franchot Gallo, MD;  Location: [facility];  Service: Urology;  Laterality: N/A;   INGUINAL HERNIA REPAIR Left 07/13/2016   Procedure: REPAIR LEFT INGUINAL HERNIA;  Surgeon: Armandina Gemma, MD;  Location: [facility];  Service: General;  Laterality: Left;   INSERTION OF MESH Left  07/13/2016   Procedure: INSERTION OF MESH;  Surgeon: Armandina Gemma, MD;  Location: [facility];  Service: General;  Laterality: Left;   MELANOMA EXCISION Left 05/05/2007   '@[facility]'$  ;   LEFT FOREARM   TONSILLECTOMY     child   TOTAL HIP ARTHROPLASTY Left 02/11/2021   Procedure: TOTAL HIP ARTHROPLASTY;  Surgeon: Marchia Bond, MD;  Location: [facility];  Service: Orthopedics;  Laterality: Left;   TOTAL SHOULDER ARTHROPLASTY Right 12/29/2017   Procedure: TOTAL SHOULDER ARTHROPLASTY;  Surgeon: Hiram Gash, MD;  Location: [facility];  Service: Orthopedics;  Laterality: Right;    Current Outpatient Medications  Medication Sig Dispense Refill   acetaminophen (TYLENOL) 500 MG tablet Take 500-1,000 mg by mouth 2 (two) times daily as needed for mild pain, moderate pain or headache.     apixaban (ELIQUIS) 5 MG TABS tablet Take 1 tablet (5 mg total) by mouth 2 (two) times daily. 180 tablet 3   atorvastatin (LIPITOR) 20 MG tablet TAKE ONE TABLET BY MOUTH THREE TIMES A WEEK 36 tablet 4   BENADRYL ALLERGY 25 MG tablet Take 12.5 mg by mouth See admin instructions. Take 12.5 mg by mouth every 8 hours  when taking Prochlorperazine     bevacizumab (AVASTIN) 2.5 mg/0.1 mL SOLN 2.5 mg by Intravitreal route See admin instructions. Injection to eyes every 4 weeks - use with vigamox     Carboxymethylcellulose Sodium 1 % GEL Place 1 drop into both eyes at bedtime.     Cyanocobalamin 1000 MCG/ML KIT Inject 1,000 mcg as directed every 30 (thirty) days.     docusate sodium (COLACE) 100 MG capsule Take 200 mg by mouth at bedtime.     losartan (COZAAR) 25 MG tablet Take 0.5-1 tablets (12.5-25 mg total) by mouth daily. If blood pressure over 145 when you check- ok to take other half tablet once per day (so total of '25mg'$  per day) (Patient taking differently: Take 12.5 mg by mouth See admin instructions. Take 12.5 mg by mouth once a day and if Systolic B/P number is over 145 when checked, may take an additional 12.5 mg once a day (max total of  25 mg/day)) 135 tablet 3   metoCLOPramide (REGLAN) 5 MG tablet Take 1 tablet (5 mg total) by mouth 3 (three) times daily before meals for 14 days. 42 tablet 0   metoprolol tartrate (LOPRESSOR) 25 MG tablet Take 12.5 mg by mouth 2 (two) times daily.     pantoprazole (PROTONIX) 40 MG tablet Take 1 tablet (40 mg total) by mouth 2 (two) times daily. 60 tablet 2   polyethylene glycol (MIRALAX / GLYCOLAX) 17 g packet Take 17 g by mouth daily as needed for moderate constipation (mix and drink as directed).     prochlorperazine (COMPAZINE) 10 MG tablet Take 1 tablet (10 mg total) by mouth every 8 (eight) hours as needed for nausea or vomiting (take with 12.'5mg'$  benadryl please). 10 tablet 0   Propylene Glycol (SYSTANE BALANCE OP) Place 1 drop into both eyes in the morning and at bedtime.     tamsulosin (FLOMAX) 0.4 MG CAPS capsule Take 0.4 mg by mouth daily.     thiamine (VITAMIN B1) 100 MG tablet Take 1 tablet (100 mg total) by mouth daily.     VIGAMOX 0.5 % ophthalmic solution Place 1 drop into both eyes See admin instructions. Instill every 4 weeks as directed - use with avastin, instill 1 drop into both eyes 4 times daily for 2 days after eye injections     No current facility-administered medications for this visit.    Allergies as of 12/08/2022 - Review Complete 11/20/2022  Allergen Reaction Noted   Tape Other (See Comments) 10/29/2022   Aspirin Other (See Comments) 11/02/2011   Percocet [oxycodone-acetaminophen] Nausea And Vomiting 11/16/2011   Vitamins [apatate] Other (See Comments) 04/22/2015    Family History  Problem Relation Age of Onset   Heart disease Mother        CABG in her 12s   Pneumonia Father    Cancer Sister        breast   Lung cancer Brother        smoker    Social History   Socioeconomic History   Marital status: Widowed    Spouse name: Not on file   Number of children: 1   Years of education: Not on file   Highest education level: Not on file  Occupational  History   Occupation: retired  Tobacco Use   Smoking status: Former    Packs/day: 0.10    Years: 4.00    Total pack years: 0.40    Types: Cigarettes    Quit date: 09/03/1956  Years since quitting: 66.3   Smokeless tobacco: Never  Vaping Use   Vaping Use: Never used  Substance and Sexual Activity   Alcohol use: No    Alcohol/week: 0.0 standard drinks of alcohol   Drug use: Never   Sexual activity: Yes  Other Topics Concern   Not on file  Social History Narrative   Widowed (lost wife 2015 from lung cancer never smoker), 1 son, 2 grandkids, 1 greatgrandkid (born on Christmas)      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       Hobbies: ride motorcycle (slingshot 3 year), rabbit and dear hunt   Right handed   Social Determinants of Health   Financial Resource Strain: Low Risk  (09/17/2022)   Overall Financial Resource Strain (CARDIA)    Difficulty of Paying Living Expenses: Not hard at all  Food Insecurity: No Food Insecurity (11/05/2022)   Hunger Vital Sign    Worried About Running Out of Food in the Last Year: Never true    Ran Out of Food in the Last Year: Never true  Transportation Needs: No Transportation Needs (11/05/2022)   PRAPARE - Hydrologist (Medical): No    Lack of Transportation (Non-Medical): No  Physical Activity: Sufficiently Active (09/17/2022)   Exercise Vital Sign    Days of Exercise per Week: 5 days    Minutes of Exercise per Session: 30 min  Stress: No Stress Concern Present (09/17/2022)   [place]    Feeling of Stress : Not at all  Social Connections: Moderately Isolated (09/17/2022)   Social Connection and Isolation Panel [NHANES]    Frequency of Communication with Friends and Family: More than three times a week    Frequency of Social Gatherings with Friends and Family: More than three times a week    Attends Religious Services: More than  4 times per year    Active Member of [employer] or Organizations: No    Attends Archivist Meetings: Never    Marital Status: Widowed  Intimate Partner Violence: Not At Risk (11/05/2022)   Humiliation, Afraid, Rape, and Kick questionnaire    Fear of Current or Ex-Partner: No    Emotionally Abused: No    Physically Abused: No    Sexually Abused: No    Review of Systems:    Constitutional: No weight loss, fever or chills Cardiovascular: No chest pain Respiratory: No SOB  Gastrointestinal: See HPI and otherwise negative   Physical Exam:  Vital signs: BP 126/78 (BP Location: Left Arm)   Pulse 84   Ht '6\' 1"'$  (1.854 m)   SpO2 97%   BMI 22.34 kg/m    Constitutional:   Pleasant Elderly Caucasian male appears to be in NAD, Well developed, Well nourished, alert and cooperative Respiratory: Respirations even and unlabored. Lungs clear to auscultation bilaterally.   No wheezes, crackles, or rhonchi.  Cardiovascular: Normal S1, S2. No MRG. Regular rate and rhythm. No peripheral edema, cyanosis or pallor.  Gastrointestinal:  Soft, nondistended, nontender. No rebound or guarding. Normal bowel sounds. No appreciable masses or hepatomegaly. Psychiatric: Oriented to person, place and time. +some confusion and repetitive questions  RELEVANT LABS AND IMAGING: CBC    Component Value Date/Time   WBC 5.6 11/11/2022 0903   RBC 4.25 11/11/2022 0903   HGB 13.7 11/11/2022 0903   HCT 40.9 11/11/2022 0903   PLT 123 (L) 11/11/2022 0903   MCV 96.2  11/11/2022 0903   MCH 32.2 11/11/2022 0903   MCHC 33.5 11/11/2022 0903   RDW 12.1 11/11/2022 0903   LYMPHSABS 0.5 (L) 11/05/2022 1237   MONOABS 0.8 11/05/2022 1237   EOSABS 0.0 11/05/2022 1237   BASOSABS 0.0 11/05/2022 1237    CMP     Component Value Date/Time   NA 138 11/11/2022 0903   K 4.3 11/11/2022 0903   CL 105 11/11/2022 0903   CO2 26 11/11/2022 0903   GLUCOSE 106 (H) 11/11/2022 0903   BUN 11 11/11/2022 0903   CREATININE 0.63  11/11/2022 0903   CREATININE 0.80 06/19/2020 1033   CALCIUM 8.6 (L) 11/11/2022 0903   PROT 6.1 (L) 11/05/2022 1237   ALBUMIN 3.2 (L) 11/05/2022 1237   AST 20 11/05/2022 1237   ALT 23 11/05/2022 1237   ALKPHOS 49 11/05/2022 1237   BILITOT 1.3 (H) 11/05/2022 1237   GFRNONAA >60 11/11/2022 0903   GFRAA >60 12/15/2017 1256    Assessment: 1.  GERD with esophagitis: Recent hospitalization with EGD showing esophagitis, no Candida or H. pylori via biopsy, doing well on Pantoprazole 40 twice daily 2.  Nausea and vomiting: Resolved 3.  Abdominal pain: Resolved  Plan: 1.  Continue Pantoprazole 40 mg twice daily, 30-60 minutes before breakfast and dinner indefinitely.  Patient can have refills for a year if he needs them.  He was not sure. 2.  Patient to follow in clinic with [place] as needed or in a year for further refills if his PCP does not want to refill for him.  Ellouise Newer, PA-C [facility] 12/08/2022, 10:43 AM  Cc: Marin Olp, MD

## 2022-12-08 NOTE — Telephone Encounter (Addendum)
Patient's daughter is calling in asking for an update on everything. Hasn't heard back, verified phone number and daughter said [place] is currently in a skilled nursing home, if we need to call them ask for Caryl Pina but Thayer Headings would like a call back to her number [phone]

## 2022-12-08 NOTE — Telephone Encounter (Signed)
No answer at 1:03 12/08/2022

## 2022-12-08 NOTE — Telephone Encounter (Signed)
Pt's daughter in law Thayer Headings left message with AN stating pt missed a call from the office regarding MRI results.

## 2022-12-08 NOTE — Patient Instructions (Addendum)
Continue taking Pantoprazole 40 mg  tablets 2 times daily 30-60 minutes before breakfast and dinner  Follow up as needed   If your blood pressure at your visit was 140/90 or greater, please contact your primary care physician to follow up on this.  _______________________________________________________  If you are age 86 or older, your body mass index should be between 23-30. Your Body mass index is 22.34 kg/m. If this is out of the aforementioned range listed, please consider follow up with your Primary Care Provider.  If you are age 55 or younger, your body mass index should be between 19-25. Your Body mass index is 22.34 kg/m. If this is out of the aformentioned range listed, please consider follow up with your Primary Care Provider.   ________________________________________________________  The [facility] providers would like to encourage you to use [facility] to communicate with providers for non-urgent requests or questions.  Due to long hold times on the telephone, sending your provider a message by [facility] may be a faster and more efficient way to get a response.  Please allow 48 business hours for a response.  Please remember that this is for non-urgent requests.  _______________________________________________________   Thank you for entrusting me with your care and choosing [facility].  Ellouise Newer PA-C

## 2022-12-09 NOTE — Progress Notes (Signed)
Agree with assessment and plan as outlined.  Given age and comorbidities, would likely not pursue follow-up endoscopy as long as he is feeling well on PPI.

## 2022-12-13 ENCOUNTER — Ambulatory Visit
Admission: RE | Admit: 2022-12-13 | Discharge: 2022-12-13 | Disposition: A | Discharge status: Home / Self Care | Payer: Medicare Other | Source: Ambulatory Visit | Attending: Physician Assistant | Admitting: Physician Assistant

## 2022-12-13 DIAGNOSIS — E512 Wernicke's encephalopathy: Secondary | ICD-10-CM | POA: Diagnosis not present

## 2022-12-13 MED ORDER — GADOPICLENOL 0.5 MMOL/ML IV SOLN
7.5000 mL | Freq: Once | INTRAVENOUS | Status: AC | PRN
  Administered 2022-12-13: 7.5 mL via INTRAVENOUS

## 2022-12-15 DIAGNOSIS — E512 Wernicke's encephalopathy: Secondary | ICD-10-CM | POA: Diagnosis not present

## 2022-12-15 DIAGNOSIS — N400 Enlarged prostate without lower urinary tract symptoms: Secondary | ICD-10-CM | POA: Diagnosis not present

## 2022-12-15 DIAGNOSIS — E559 Vitamin D deficiency, unspecified: Secondary | ICD-10-CM | POA: Diagnosis not present

## 2022-12-15 DIAGNOSIS — I10 Essential (primary) hypertension: Secondary | ICD-10-CM | POA: Diagnosis not present

## 2022-12-15 NOTE — Progress Notes (Signed)
The the MR Angiogram does not show any occlusions, clots or abnormalities, just age related changes

## 2022-12-17 ENCOUNTER — Emergency Department (HOSPITAL_BASED_OUTPATIENT_CLINIC_OR_DEPARTMENT_OTHER)
Admission: EM | Admit: 2022-12-17 | Discharge: 2022-12-17 | Disposition: A | Discharge status: Home / Self Care | Payer: Medicare Other | Attending: Emergency Medicine | Admitting: Emergency Medicine

## 2022-12-17 ENCOUNTER — Encounter (HOSPITAL_BASED_OUTPATIENT_CLINIC_OR_DEPARTMENT_OTHER): Payer: Self-pay

## 2022-12-17 DIAGNOSIS — T839XXA Unspecified complication of genitourinary prosthetic device, implant and graft, initial encounter: Secondary | ICD-10-CM

## 2022-12-17 DIAGNOSIS — N3001 Acute cystitis with hematuria: Secondary | ICD-10-CM | POA: Insufficient documentation

## 2022-12-17 DIAGNOSIS — Z7901 Long term (current) use of anticoagulants: Secondary | ICD-10-CM | POA: Insufficient documentation

## 2022-12-17 DIAGNOSIS — T83098A Other mechanical complication of other indwelling urethral catheter, initial encounter: Secondary | ICD-10-CM | POA: Insufficient documentation

## 2022-12-17 DIAGNOSIS — T83091A Other mechanical complication of indwelling urethral catheter, initial encounter: Secondary | ICD-10-CM | POA: Diagnosis not present

## 2022-12-17 DIAGNOSIS — N3000 Acute cystitis without hematuria: Secondary | ICD-10-CM | POA: Diagnosis not present

## 2022-12-17 LAB — URINALYSIS, ROUTINE W REFLEX MICROSCOPIC
Bilirubin Urine: NEGATIVE
Glucose, UA: NEGATIVE mg/dL
Ketones, ur: NEGATIVE mg/dL
Nitrite: POSITIVE — AB
Protein, ur: >300 mg/dL — AB
RBC / HPF: >50 RBC/hpf (ref 0–5)
Specific Gravity, Urine: 1.012 (ref 1.005–1.030)
WBC, UA: >50 WBC/hpf (ref 0–5)
pH: 7.5 (ref 5.0–8.0)

## 2022-12-17 LAB — BASIC METABOLIC PANEL
Anion gap: 13 (ref 5–15)
BUN: 17 mg/dL (ref 8–23)
CO2: 23 mmol/L (ref 22–32)
Calcium: 9.6 mg/dL (ref 8.9–10.3)
Chloride: 104 mmol/L (ref 98–111)
Creatinine, Ser: 1.14 mg/dL (ref 0.61–1.24)
GFR, Estimated: >60 mL/min (ref >60)
Glucose, Bld: 183 mg/dL — ABNORMAL HIGH (ref 70–99)
Potassium: 3.9 mmol/L (ref 3.5–5.1)
Sodium: 140 mmol/L (ref 135–145)

## 2022-12-17 LAB — CBC WITH DIFFERENTIAL/PLATELET
Abs Immature Granulocytes: 0.04 10*3/uL (ref 0.00–0.07)
Basophils Absolute: 0 10*3/uL (ref 0.0–0.1)
Basophils Relative: 0 %
Eosinophils Absolute: 0 10*3/uL (ref 0.0–0.5)
Eosinophils Relative: 0 %
HCT: 42.4 % (ref 39.0–52.0)
Hemoglobin: 13.7 g/dL (ref 13.0–17.0)
Immature Granulocytes: 0 %
Lymphocytes Relative: 2 %
Lymphs Abs: 0.3 10*3/uL — ABNORMAL LOW (ref 0.7–4.0)
MCH: 30 pg (ref 26.0–34.0)
MCHC: 32.3 g/dL (ref 30.0–36.0)
MCV: 93 fL (ref 80.0–100.0)
Monocytes Absolute: 0.5 10*3/uL (ref 0.1–1.0)
Monocytes Relative: 4 %
Neutro Abs: 12.6 10*3/uL — ABNORMAL HIGH (ref 1.7–7.7)
Neutrophils Relative %: 94 %
Platelets: 261 10*3/uL (ref 150–400)
RBC: 4.56 MIL/uL (ref 4.22–5.81)
RDW: 13.1 % (ref 11.5–15.5)
WBC: 13.5 10*3/uL — ABNORMAL HIGH (ref 4.0–10.5)
nRBC: 0 % (ref 0.0–0.2)

## 2022-12-17 MED ORDER — FOSFOMYCIN TROMETHAMINE 3 G PO PACK
3.0000 g | PACK | Freq: Once | ORAL | Status: AC
  Administered 2022-12-17: 3 g via ORAL
  Filled 2022-12-17: qty 3

## 2022-12-17 NOTE — ED Notes (Signed)
Bladder Scan: >555 mL. Current Foley Catheter removed by this RN.

## 2022-12-17 NOTE — Discharge Instructions (Signed)
Follow-up with a urologist as discussed.  Return to the emergency room if he has any worsening symptoms such as increased confusion, fevers, vomiting or other worsening symptoms.

## 2022-12-17 NOTE — ED Triage Notes (Signed)
Patient here POV from Home with Family.  Endorses having Indwelling Urinary Catheter for 1 Month. Has not drained since 1200 today.  Uncomfortable During Triage. Active and Alert. History of Dementia.

## 2022-12-17 NOTE — ED Provider Notes (Signed)
[facility] Provider Note   CSN: [id number] Arrival date & time: 12/17/22  2121     History  Chief Complaint  Patient presents with   Catheter Problem    KINNETH FUJIWARA is a 88 y.o. male.  Patient is a 88 year old male who presents with difficulty urinating.  He has an indwelling Foley catheter that is been there since December.  He is here with his son at bedside who helps provide history.  Patient had 2 admissions during December and ultimately was diagnosed with Wernicke's encephalopathy.  He was started on thiamine treatments.  He has had some ongoing confusion and is followed by neurology.  His son who is at bedside says that his confusion is baseline currently.  He has some good days and bad days but overall no worsening.  He has BPH and has had an indwelling Foley catheter since December.  Son states that seems to have been clogged today and has not drained since about 12:00.  He has had some discomfort in his abdomen.  No vomiting.  No fevers.       Home Medications Prior to Admission medications   Medication Sig Start Date End Date Taking? Authorizing Provider  acetaminophen (TYLENOL) 500 MG tablet Take 500-1,000 mg by mouth 2 (two) times daily as needed for mild pain, moderate pain or headache.    [provider]  apixaban (ELIQUIS) 5 MG TABS tablet Take 1 tablet (5 mg total) by mouth 2 (two) times daily. 02/24/22   Martinique, Peter M, MD  atorvastatin (LIPITOR) 20 MG tablet TAKE ONE TABLET BY MOUTH THREE TIMES A WEEK 11/12/22   Marin Olp, MD  BENADRYL ALLERGY 25 MG tablet Take 12.5 mg by mouth See admin instructions. Take 12.5 mg by mouth every 8 hours when taking Prochlorperazine    [provider]  bevacizumab (AVASTIN) 2.5 mg/0.1 mL SOLN 2.5 mg by Intravitreal route See admin instructions. Injection to eyes every 4 weeks - use with vigamox    [provider]  Carboxymethylcellulose Sodium 1 % GEL  Place 1 drop into both eyes at bedtime.    [provider]  Cyanocobalamin 1000 MCG/ML KIT Inject 1,000 mcg as directed every 30 (thirty) days.    [provider]  docusate sodium (COLACE) 100 MG capsule Take 200 mg by mouth at bedtime.    [provider]  losartan (COZAAR) 25 MG tablet Take 0.5-1 tablets (12.5-25 mg total) by mouth daily. If blood pressure over 145 when you check- ok to take other half tablet once per day (so total of '25mg'$  per day) Patient taking differently: Take 12.5 mg by mouth See admin instructions. Take 12.5 mg by mouth once a day and if Systolic B/P number is over 145 when checked, may take an additional 12.5 mg once a day (max total of 25 mg/day) 09/21/22   Marin Olp, MD  metoCLOPramide (REGLAN) 5 MG tablet Take 1 tablet (5 mg total) by mouth 3 (three) times daily before meals for 14 days. 11/03/22 11/17/22  Terrilee Croak, MD  metoprolol tartrate (LOPRESSOR) 25 MG tablet Take 12.5 mg by mouth 2 (two) times daily.    [provider]  pantoprazole (PROTONIX) 40 MG tablet Take 1 tablet (40 mg total) by mouth 2 (two) times daily. 11/03/22 02/01/23  Terrilee Croak, MD  polyethylene glycol (MIRALAX / GLYCOLAX) 17 g packet Take 17 g by mouth daily as needed for moderate constipation (mix and drink as  directed).    [provider]  prochlorperazine (COMPAZINE) 10 MG tablet Take 1 tablet (10 mg total) by mouth every 8 (eight) hours as needed for nausea or vomiting (take with 12.'5mg'$  benadryl please). 10/27/22   Small, Brooke L, PA  Propylene Glycol (SYSTANE BALANCE OP) Place 1 drop into both eyes in the morning and at bedtime.    [provider]  tamsulosin (FLOMAX) 0.4 MG CAPS capsule Take 0.4 mg by mouth daily. 12/30/21   [provider]  thiamine (VITAMIN B1) 100 MG tablet Take 1 tablet (100 mg total) by mouth daily. 11/10/22   Amin, Ankit Chirag, MD  VIGAMOX 0.5 % ophthalmic solution Place 1 drop into both eyes See  admin instructions. Instill every 4 weeks as directed - use with avastin, instill 1 drop into both eyes 4 times daily for 2 days after eye injections 05/28/14   [provider]      Allergies    Tape, Aspirin, Percocet [oxycodone-acetaminophen], and Vitamins [apatate]    Review of Systems   Review of Systems  Unable to perform ROS: Mental status change    Physical Exam Updated Vital Signs BP 132/78   Pulse 99   Temp (!) 97.4 F (36.3 C) (Temporal)   Resp 18   Ht '6\' 1"'$  (1.854 m)   Wt 76.8 kg   SpO2 96%   BMI 22.34 kg/m  Physical Exam Constitutional:      Appearance: He is well-developed.  HENT:     Head: Normocephalic and atraumatic.  Eyes:     Pupils: Pupils are equal, round, and reactive to light.  Cardiovascular:     Rate and Rhythm: Normal rate and regular rhythm.     Heart sounds: Normal heart sounds.  Pulmonary:     Effort: Pulmonary effort is normal. No respiratory distress.     Breath sounds: Normal breath sounds. No wheezing or rales.  Chest:     Chest wall: No tenderness.  Abdominal:     General: Bowel sounds are normal.     Palpations: Abdomen is soft.     Tenderness: There is no abdominal tenderness. There is no guarding or rebound.  Genitourinary:    Comments: Foley catheter has been changed out prior to my arrival.  Straining well.  There was over 600 cc of cloudy urine. Musculoskeletal:        General: Normal range of motion.     Cervical back: Normal range of motion and neck supple.  Lymphadenopathy:     Cervical: No cervical adenopathy.  Skin:    General: Skin is warm and dry.     Findings: No rash.  Neurological:     General: No focal deficit present.     Mental Status: He is alert.     Comments: Oriented to person and place.  Confused to time.  No focal deficits noted.     ED Results / Procedures / Treatments   Labs (all labs ordered are listed, but only abnormal results are displayed) Labs Reviewed  URINALYSIS, ROUTINE W REFLEX  MICROSCOPIC - Abnormal; Notable for the following components:      Result Value   APPearance CLOUDY (*)    Hgb urine dipstick MODERATE (*)    Protein, ur >300 (*)    Nitrite POSITIVE (*)    Leukocytes,Ua LARGE (*)    Bacteria, UA MANY (*)    All other components within normal limits  BASIC METABOLIC PANEL - Abnormal; Notable for the following components:  Glucose, Bld 183 (*)    All other components within normal limits  CBC WITH DIFFERENTIAL/PLATELET - Abnormal; Notable for the following components:   WBC 13.5 (*)    Neutro Abs 12.6 (*)    Lymphs Abs 0.3 (*)    All other components within normal limits    EKG None  Radiology No results found.  Procedures Procedures    Medications Ordered in ED Medications  fosfomycin (MONUROL) packet 3 g (3 g Oral Given 12/17/22 2237)    ED Course/ Medical Decision Making/ A&P                             Medical Decision Making Amount and/or Complexity of Data Reviewed Labs: ordered.  Risk Prescription drug management.   Patient is a 88 year old male who presents with an obstructed Foley catheter.  This was replaced at bedside by the RN.  It is now currently draining well.  His urine appears cloudy.  His urinalysis is consistent with infection.  I reviewed his old cultures and based on this, I treated him with fosfomycin.  His other labs are nonconcerning.  He is at his baseline mental status per family who is at bedside.  He is not febrile.  They have not noticed any other change in status.  He seems to be appropriate for discharge.  They have an appointment with the urologist tomorrow per the son who is at bedside.  Return precautions were given.  Final Clinical Impression(s) / ED Diagnoses Final diagnoses:  Foley catheter problem, initial encounter ([place])  Acute cystitis without hematuria    Rx / DC Orders ED Discharge Orders     None         Malvin Johns, MD 12/17/22 2334

## 2022-12-17 NOTE — ED Notes (Signed)
Pt verbalized understanding of d/c instructions, meds, and followup care. Denies questions. VSS, no distress noted. Steady gait to exit with all belongings.  ?

## 2022-12-19 DIAGNOSIS — M1990 Unspecified osteoarthritis, unspecified site: Secondary | ICD-10-CM | POA: Diagnosis not present

## 2022-12-19 DIAGNOSIS — H3530 Unspecified macular degeneration: Secondary | ICD-10-CM | POA: Diagnosis not present

## 2022-12-19 DIAGNOSIS — G629 Polyneuropathy, unspecified: Secondary | ICD-10-CM | POA: Diagnosis not present

## 2022-12-19 DIAGNOSIS — G4733 Obstructive sleep apnea (adult) (pediatric): Secondary | ICD-10-CM | POA: Diagnosis not present

## 2022-12-19 DIAGNOSIS — E559 Vitamin D deficiency, unspecified: Secondary | ICD-10-CM | POA: Diagnosis not present

## 2022-12-19 DIAGNOSIS — Z87442 Personal history of urinary calculi: Secondary | ICD-10-CM | POA: Diagnosis not present

## 2022-12-19 DIAGNOSIS — K219 Gastro-esophageal reflux disease without esophagitis: Secondary | ICD-10-CM | POA: Diagnosis not present

## 2022-12-19 DIAGNOSIS — I2510 Atherosclerotic heart disease of native coronary artery without angina pectoris: Secondary | ICD-10-CM | POA: Diagnosis not present

## 2022-12-19 DIAGNOSIS — I10 Essential (primary) hypertension: Secondary | ICD-10-CM | POA: Diagnosis not present

## 2022-12-19 DIAGNOSIS — K5790 Diverticulosis of intestine, part unspecified, without perforation or abscess without bleeding: Secondary | ICD-10-CM | POA: Diagnosis not present

## 2022-12-19 DIAGNOSIS — I700 Atherosclerosis of aorta: Secondary | ICD-10-CM | POA: Diagnosis not present

## 2022-12-19 DIAGNOSIS — N400 Enlarged prostate without lower urinary tract symptoms: Secondary | ICD-10-CM | POA: Diagnosis not present

## 2022-12-19 DIAGNOSIS — E785 Hyperlipidemia, unspecified: Secondary | ICD-10-CM | POA: Diagnosis not present

## 2022-12-19 DIAGNOSIS — Z7901 Long term (current) use of anticoagulants: Secondary | ICD-10-CM | POA: Diagnosis not present

## 2022-12-19 DIAGNOSIS — Z466 Encounter for fitting and adjustment of urinary device: Secondary | ICD-10-CM | POA: Diagnosis not present

## 2022-12-19 DIAGNOSIS — E538 Deficiency of other specified B group vitamins: Secondary | ICD-10-CM | POA: Diagnosis not present

## 2022-12-19 DIAGNOSIS — Z87891 Personal history of nicotine dependence: Secondary | ICD-10-CM | POA: Diagnosis not present

## 2022-12-19 DIAGNOSIS — Z9181 History of falling: Secondary | ICD-10-CM | POA: Diagnosis not present

## 2022-12-19 DIAGNOSIS — I482 Chronic atrial fibrillation, unspecified: Secondary | ICD-10-CM | POA: Diagnosis not present

## 2022-12-21 DIAGNOSIS — G629 Polyneuropathy, unspecified: Secondary | ICD-10-CM | POA: Diagnosis not present

## 2022-12-21 DIAGNOSIS — I700 Atherosclerosis of aorta: Secondary | ICD-10-CM | POA: Diagnosis not present

## 2022-12-21 DIAGNOSIS — I2510 Atherosclerotic heart disease of native coronary artery without angina pectoris: Secondary | ICD-10-CM | POA: Diagnosis not present

## 2022-12-21 DIAGNOSIS — I482 Chronic atrial fibrillation, unspecified: Secondary | ICD-10-CM | POA: Diagnosis not present

## 2022-12-21 DIAGNOSIS — N400 Enlarged prostate without lower urinary tract symptoms: Secondary | ICD-10-CM | POA: Diagnosis not present

## 2022-12-21 DIAGNOSIS — Z466 Encounter for fitting and adjustment of urinary device: Secondary | ICD-10-CM | POA: Diagnosis not present

## 2022-12-21 NOTE — Progress Notes (Signed)
Assessment/Plan:   Memory Impairment Joseph Simpson is a very pleasant 88 y.o. RH male with a history of HTN, HLD, hard of hearing, P A-fib on Eliquis, CAD, B12 deficiency,  Hyperglycemia/pre DM, polyneuropathy, macular degeneration,and s/p partial gastrectomy and gastrojejunostomy about 40 years ago, recent E. Faecalis UTI, seen today in follow up for recent acute confusional state 10/2022.  MRI brain with an without contrast suggestive of Wernicke's encephalopathy. HisThiamine reaching a therapeutic levels.  EEG was negative for seizures. He was on SNF with PT, ST, OT, but since last Thursday he is back at home with his son and daughter in law, with HHN. MMSE today is 21/30. Discussed with his daughter-in-law that when the confusion resolves, the baseline cognitive status will be this current memory state.  Of course, needs to make sure that there is no recurrence of UTI to prevent any acute on chronic cognitive changes.      Follow up in  6 months. No antidementia medication is indicated for this nice patient, given that the side effects, age, risk factors, outweigh  the benefits of the medicine. Recommend good control of cardiovascular risk factors Follow with urology regarding Foley catheter, monitor closely to prevent further UTIs and hospitalizations. Continue to control mood as per PCP    Subjective:    This patient is accompanied in the office by his daughter  who supplements the history.  Previous records as well as any outside records available were reviewed prior to todays visit. Patient was last seen on  1.3.24  at which time his MMSE was 20/30 .    Any changes in memory since last visit? "Not really".  Daughter reports that prior to this past December admission, he was "very sharp", however, he never returned to that baseline.  He continued to receive thiamine with good results.  He is able to converse with his family.  He appears more alert this morning.  However, he may  experience some changes in sense of time. repeats oneself?  Endorsed.  He asks frequently what time it is, when is the appointment, etc. Disoriented when walking into a room?  On recent episode, he was not recognizing his own house.  Leaving objects in unusual places?    denies   Wandering behavior?  denies   Any worsening depression?:  denies   Hallucinations or paranoia?  "He sees dead people to come to talk to him ".  Seizures?    denies    Any sleep changes? "Some vivid dreams", denies REM behavior or sleepwalking   Sleep apnea?   denies   Any hygiene concerns?  There is a change of sense of time, for example he thinks that he has do not something but this was days ago, including bathing.    Independent of bathing and dressing?  Needs assistance  Does the patient needs help with medications?  Daughter-in-law supervises, because at times, he thinks that he took a pill and he did not.   Who is in charge of the finances?  Family is in charge    Any changes in appetite?  denies    Patient have trouble swallowing?  denies   Does the patient cook?  Any kitchen accidents such as leaving the stove on? Patient denies   Any headaches?   denies   Chronic back pain  denies   Ambulates with difficulty?   He has a history of bilateral right greater than left neuropathy, limiting his ambulation, he uses a walker  to ambulate and to carry his Foley. Recent falls or head injuries? denies     Unilateral weakness, numbness or tingling?    denies   Any tremors?  denies   Any anosmia?  Patient denies   Any incontinence of urine? Recent UTI due to catheter, replaced, given antibiotics x1.  Any bowel dysfunction?     denies      Patient lives :he was living alone, now with his family after being in the SNF. Does the patient drive?  He no longer drives.      MRI brain 11/06/22 personally reviewed : Abnormal/symmetric T2/FLAIR hyperintensity involving the dorsomedial thalami, mamillary bodies,  periaqueductal gray, and tectal plate, compatible with Wernicke encephalopathy    Pertinent labs TSH 1.8, A1C 5.7,    Initial evaluation 12/02/2022 How long did patient have memory difficulties?  Prior to last 2 admissions with GI issues and encephalopathy, his daughter reports that he did not have any memory issues. "He was very sharp". Status during the last admission was complicated with E. Faecalis, metabolic abnormalities (hypokalemia and hypomagnesemia). No alcohol history, confirmed by family. He was found to have abnormal MRI compatible with Wernicke's Encephalopathy. He was given IV Thiamine as well. His overall status is improving since discharge to SNF. He reports that prior to this admission he could have a normal conversation, able to remember names and denies repeating himself.  Disoriented when walking into a room?  Patient denies  Leaving objects in unusual places?   denies   Wandering behavior? denies   Any personality changes prior to hospitalization with confusion? Denies."He was doing well". Any depression?:  denies   Hallucinations or paranoia? denies   Seizures?   denies    Any sleep changes?  Denies  vivid dreams, REM behavior or sleepwalking   Sleep apnea? Denies, although per chart there is OSA documentation.    Any hygiene concerns?  denies   Independent of bathing and dressing?  Endorsed  Does the patient need help with medications? Daughter supervises   Who is in charge of the finances?  Family supervises  Any changes in appetite?  HE was recently hospitalized, thus appetite has "not been great". During last admission he had nausea and vomiting.      Patient have trouble swallowing?  denies   Does the patient cook? No   Any headaches?  denies   Chronic back pain  denies   Ambulates with difficulty?  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 Today he is on a wheelchair post hospitalization, he is recuperating, feels weak.  Recent  falls or head injuries?  denies     Unilateral weakness, numbness or tingling?  denies   Any tremors?  denies   Any anosmia?  denies   Any incontinence of urine? Recent UTI.  retention in the setting of BPH, has a catheter and he is on Flomax, will be followed by Urology.  Any bowel dysfunction?  denies      Patient lives  currently on SNF  History of heavy alcohol intake? denies   History of heavy tobacco use?   denies   Family history of dementia?   Denies  Lives by himself. Was very independent.  Retired [employer] driver   MRI brain 99/37/16 personally reviewed : Abnormal/symmetric T2/FLAIR hyperintensity involving the dorsomedial thalami, mamillary bodies, periaqueductal gray, and tectal plate, compatible with Wernicke encephalopathy    Pertinent labs TSH 1.8, A1C 5.7,    PREVIOUS MEDICATIONS:  CURRENT MEDICATIONS:  Outpatient Encounter Medications as of 12/22/2022  Medication Sig   Cholecalciferol (VITAMIN D3) 1.25 MG (50000 UT) CAPS Take 1 capsule by mouth once a week.   acetaminophen (TYLENOL) 500 MG tablet Take 500-1,000 mg by mouth 2 (two) times daily as needed for mild pain, moderate pain or headache.   apixaban (ELIQUIS) 5 MG TABS tablet Take 1 tablet (5 mg total) by mouth 2 (two) times daily.   atorvastatin (LIPITOR) 20 MG tablet TAKE ONE TABLET BY MOUTH THREE TIMES A WEEK   BENADRYL ALLERGY 25 MG tablet Take 12.5 mg by mouth See admin instructions. Take 12.5 mg by mouth every 8 hours when taking Prochlorperazine   bevacizumab (AVASTIN) 2.5 mg/0.1 mL SOLN 2.5 mg by Intravitreal route See admin instructions. Injection to eyes every 4 weeks - use with vigamox   Carboxymethylcellulose Sodium 1 % GEL Place 1 drop into both eyes at bedtime.   Cyanocobalamin 1000 MCG/ML KIT Inject 1,000 mcg as directed every 30 (thirty) days.   docusate sodium (COLACE) 100 MG capsule Take 200 mg by mouth at bedtime.   losartan (COZAAR) 25 MG tablet Take 0.5-1 tablets (12.5-25 mg total) by mouth daily.  If blood pressure over 145 when you check- ok to take other half tablet once per day (so total of '25mg'$  per day) (Patient taking differently: Take 12.5 mg by mouth See admin instructions. Take 12.5 mg by mouth once a day and if Systolic B/P number is over 145 when checked, may take an additional 12.5 mg once a day (max total of 25 mg/day))   metoCLOPramide (REGLAN) 5 MG tablet Take 1 tablet (5 mg total) by mouth 3 (three) times daily before meals for 14 days.   metoprolol tartrate (LOPRESSOR) 25 MG tablet Take 12.5 mg by mouth 2 (two) times daily.   pantoprazole (PROTONIX) 40 MG tablet Take 1 tablet (40 mg total) by mouth 2 (two) times daily.   polyethylene glycol (MIRALAX / GLYCOLAX) 17 g packet Take 17 g by mouth daily as needed for moderate constipation (mix and drink as directed).   prochlorperazine (COMPAZINE) 10 MG tablet Take 1 tablet (10 mg total) by mouth every 8 (eight) hours as needed for nausea or vomiting (take with 12.'5mg'$  benadryl please).   Propylene Glycol (SYSTANE BALANCE OP) Place 1 drop into both eyes in the morning and at bedtime.   tamsulosin (FLOMAX) 0.4 MG CAPS capsule Take 0.4 mg by mouth daily.   thiamine (VITAMIN B1) 100 MG tablet Take 1 tablet (100 mg total) by mouth daily.   VIGAMOX 0.5 % ophthalmic solution Place 1 drop into both eyes See admin instructions. Instill every 4 weeks as directed - use with avastin, instill 1 drop into both eyes 4 times daily for 2 days after eye injections   No facility-administered encounter medications on file as of 12/22/2022.       12/22/2022   12:00 PM 11/21/2022    9:00 AM  MMSE - Mini Mental State Exam  Orientation to time 2 1  Orientation to Place 4 5  Registration 3 1  Attention/ Calculation 4 5  Recall 0 0  Language- name 2 objects 2 2  Language- repeat 1 1  Language- follow 3 step command 3 3  Language- read & follow direction 1 1  Write a sentence 0 0  Copy design 1 1  Total score 21 20       No data to display  Objective:     PHYSICAL EXAMINATION:    VITALS:   Vitals:   12/22/22 0840  BP: 138/74  Pulse: 100  Resp: 18  SpO2: 97%  Weight: 164 lb (74.4 kg)  Height: '6\' 1"'$  (1.854 m)    GEN:  The patient appears stated age and is in NAD. HEENT:  Normocephalic, atraumatic.   Neurological examination:  General: NAD, well-groomed, appears stated age. Orientation: The patient is alert. Oriented to person, place and not to date Cranial nerves: There is good facial symmetry.The speech is fluent and clear. No aphasia or dysarthria. Fund of knowledge is appropriate. Recent and remote memory are impaired. Attention and concentration are reduced.  Able to name objects and repeat phrases.  Hearing is decreased to conversational tone.  Delayed recall 0/3   Sensation: Sensation is intact to light touch throughout Motor: Strength is at least antigravity x4. DTR's1 /4 in UE/LE     Movement examination: Tone: There is normal tone in the UE/LE Abnormal movements:  no tremor.  No myoclonus.  No asterixis.   Coordination:  There is no decremation with RAM's. Normal finger to nose  Gait and Station: The patient has difficulty arising out of a deep-seated chair without the use of the hands, uses a walker to ambulate. The patient's stride length is good.  Gait is cautious and narrow.    Thank you for allowing [place] the opportunity to participate in the care of this nice patient. Please do not hesitate to contact us for any questions or concerns.   Total time spent on today's visit was 40 minutes dedicated to this patient today, preparing to see patient, examining the patient, ordering tests and/or medications and counseling the patient, documenting clinical information in the EHR or other health record, independently interpreting results and communicating results to the patient/family, discussing treatment and goals, answering patient's questions and coordinating care.  Cc:  Marin Olp, MD  Sharene Butters 12/22/2022 12:09 PM

## 2022-12-22 ENCOUNTER — Telehealth: Payer: Self-pay | Admitting: Family Medicine

## 2022-12-22 ENCOUNTER — Encounter: Payer: Self-pay | Admitting: Physician Assistant

## 2022-12-22 ENCOUNTER — Ambulatory Visit (INDEPENDENT_AMBULATORY_CARE_PROVIDER_SITE_OTHER): Payer: Medicare Other | Admitting: Physician Assistant

## 2022-12-22 VITALS — BP 138/74 | HR 100 | Resp 18 | Ht 73.0 in | Wt 164.0 lb

## 2022-12-22 DIAGNOSIS — G629 Polyneuropathy, unspecified: Secondary | ICD-10-CM | POA: Diagnosis not present

## 2022-12-22 DIAGNOSIS — I700 Atherosclerosis of aorta: Secondary | ICD-10-CM | POA: Diagnosis not present

## 2022-12-22 DIAGNOSIS — E512 Wernicke's encephalopathy: Secondary | ICD-10-CM

## 2022-12-22 DIAGNOSIS — R413 Other amnesia: Secondary | ICD-10-CM

## 2022-12-22 DIAGNOSIS — I2510 Atherosclerotic heart disease of native coronary artery without angina pectoris: Secondary | ICD-10-CM | POA: Diagnosis not present

## 2022-12-22 DIAGNOSIS — I482 Chronic atrial fibrillation, unspecified: Secondary | ICD-10-CM | POA: Diagnosis not present

## 2022-12-22 DIAGNOSIS — Z466 Encounter for fitting and adjustment of urinary device: Secondary | ICD-10-CM | POA: Diagnosis not present

## 2022-12-22 DIAGNOSIS — N400 Enlarged prostate without lower urinary tract symptoms: Secondary | ICD-10-CM | POA: Diagnosis not present

## 2022-12-22 NOTE — Telephone Encounter (Signed)
Home Health Verbal Orders  Agency:  [facility]   Caller: Joya Gaskins [phone]  Requesting OT/ PT/ Skilled nursing/ Social Work/ Speech:  PT  Reason for Request:    Frequency:   1 x 1 week  2 x 2 weeks  1 x 5 weeks

## 2022-12-22 NOTE — Patient Instructions (Signed)
Please contact Dr. Anthoney Harada for assessment of decision of mental capacity and competency [phone] Followup in 6 months

## 2022-12-22 NOTE — Telephone Encounter (Signed)
Called and spoke with Jatan VO provided.

## 2022-12-23 ENCOUNTER — Telehealth: Payer: Self-pay | Admitting: Family Medicine

## 2022-12-23 ENCOUNTER — Encounter (INDEPENDENT_AMBULATORY_CARE_PROVIDER_SITE_OTHER): Payer: Medicare Other | Admitting: Ophthalmology

## 2022-12-23 DIAGNOSIS — H33301 Unspecified retinal break, right eye: Secondary | ICD-10-CM | POA: Diagnosis not present

## 2022-12-23 DIAGNOSIS — I700 Atherosclerosis of aorta: Secondary | ICD-10-CM | POA: Diagnosis not present

## 2022-12-23 DIAGNOSIS — I482 Chronic atrial fibrillation, unspecified: Secondary | ICD-10-CM | POA: Diagnosis not present

## 2022-12-23 DIAGNOSIS — I10 Essential (primary) hypertension: Secondary | ICD-10-CM | POA: Diagnosis not present

## 2022-12-23 DIAGNOSIS — Z466 Encounter for fitting and adjustment of urinary device: Secondary | ICD-10-CM | POA: Diagnosis not present

## 2022-12-23 DIAGNOSIS — H43813 Vitreous degeneration, bilateral: Secondary | ICD-10-CM

## 2022-12-23 DIAGNOSIS — H35033 Hypertensive retinopathy, bilateral: Secondary | ICD-10-CM | POA: Diagnosis not present

## 2022-12-23 DIAGNOSIS — D3132 Benign neoplasm of left choroid: Secondary | ICD-10-CM

## 2022-12-23 DIAGNOSIS — H353231 Exudative age-related macular degeneration, bilateral, with active choroidal neovascularization: Secondary | ICD-10-CM | POA: Diagnosis not present

## 2022-12-23 DIAGNOSIS — N400 Enlarged prostate without lower urinary tract symptoms: Secondary | ICD-10-CM | POA: Diagnosis not present

## 2022-12-23 DIAGNOSIS — G629 Polyneuropathy, unspecified: Secondary | ICD-10-CM | POA: Diagnosis not present

## 2022-12-23 DIAGNOSIS — I2510 Atherosclerotic heart disease of native coronary artery without angina pectoris: Secondary | ICD-10-CM | POA: Diagnosis not present

## 2022-12-23 NOTE — Telephone Encounter (Signed)
Home Health Verbal Orders  Agency:  [facility]: Tami Lin, [place] [phone]  Requesting OT/ PT/ Skilled nursing/ Social Work/ Speech:  OT  Reason for Request:  To assist patient to return independence with work on transfers and ADLs.   Frequency:   1 x 4 weeks

## 2022-12-24 ENCOUNTER — Telehealth: Payer: Self-pay

## 2022-12-24 DIAGNOSIS — R3982 Chronic bladder pain: Secondary | ICD-10-CM | POA: Diagnosis not present

## 2022-12-24 NOTE — Telephone Encounter (Signed)
     Patient  visit on 2/1  at [facility] you been able to follow up with your primary care physician? Yes   The patient was or was not able to obtain any needed medicine or equipment. Yes   Are there diet recommendations that you are having difficulty following? Na   Patient expresses understanding of discharge instructions and education provided has no other needs at this time.  Yes      [facility] [phone] [address], [place], [place] [address] Phone: [phone] Email: [email or web address]

## 2022-12-24 NOTE — Telephone Encounter (Signed)
Called and lm on [place] secure vm with VO.

## 2022-12-28 ENCOUNTER — Telehealth: Payer: Self-pay | Admitting: Family Medicine

## 2022-12-28 NOTE — Telephone Encounter (Signed)
Joseph Simpson with [facility] Nurse requests to be called asap regarding Lab Orders

## 2022-12-29 ENCOUNTER — Encounter: Payer: Self-pay | Admitting: Family Medicine

## 2022-12-29 DIAGNOSIS — N400 Enlarged prostate without lower urinary tract symptoms: Secondary | ICD-10-CM | POA: Diagnosis not present

## 2022-12-29 DIAGNOSIS — Z466 Encounter for fitting and adjustment of urinary device: Secondary | ICD-10-CM | POA: Diagnosis not present

## 2022-12-29 DIAGNOSIS — I2510 Atherosclerotic heart disease of native coronary artery without angina pectoris: Secondary | ICD-10-CM | POA: Diagnosis not present

## 2022-12-29 DIAGNOSIS — I700 Atherosclerosis of aorta: Secondary | ICD-10-CM | POA: Diagnosis not present

## 2022-12-29 DIAGNOSIS — I482 Chronic atrial fibrillation, unspecified: Secondary | ICD-10-CM | POA: Diagnosis not present

## 2022-12-29 DIAGNOSIS — G629 Polyneuropathy, unspecified: Secondary | ICD-10-CM | POA: Diagnosis not present

## 2022-12-29 NOTE — Telephone Encounter (Signed)
Returned Tina's call and all questions answered.

## 2022-12-30 DIAGNOSIS — Z466 Encounter for fitting and adjustment of urinary device: Secondary | ICD-10-CM | POA: Diagnosis not present

## 2022-12-30 DIAGNOSIS — I2510 Atherosclerotic heart disease of native coronary artery without angina pectoris: Secondary | ICD-10-CM | POA: Diagnosis not present

## 2022-12-30 DIAGNOSIS — G629 Polyneuropathy, unspecified: Secondary | ICD-10-CM | POA: Diagnosis not present

## 2022-12-30 DIAGNOSIS — I482 Chronic atrial fibrillation, unspecified: Secondary | ICD-10-CM | POA: Diagnosis not present

## 2022-12-30 DIAGNOSIS — N400 Enlarged prostate without lower urinary tract symptoms: Secondary | ICD-10-CM | POA: Diagnosis not present

## 2022-12-30 DIAGNOSIS — I700 Atherosclerosis of aorta: Secondary | ICD-10-CM | POA: Diagnosis not present

## 2022-12-31 ENCOUNTER — Other Ambulatory Visit: Payer: Self-pay

## 2022-12-31 DIAGNOSIS — E512 Wernicke's encephalopathy: Secondary | ICD-10-CM

## 2023-01-01 DIAGNOSIS — R3914 Feeling of incomplete bladder emptying: Secondary | ICD-10-CM | POA: Diagnosis not present

## 2023-01-01 DIAGNOSIS — Z466 Encounter for fitting and adjustment of urinary device: Secondary | ICD-10-CM | POA: Diagnosis not present

## 2023-01-01 DIAGNOSIS — I2510 Atherosclerotic heart disease of native coronary artery without angina pectoris: Secondary | ICD-10-CM | POA: Diagnosis not present

## 2023-01-01 DIAGNOSIS — G629 Polyneuropathy, unspecified: Secondary | ICD-10-CM | POA: Diagnosis not present

## 2023-01-01 DIAGNOSIS — N401 Enlarged prostate with lower urinary tract symptoms: Secondary | ICD-10-CM | POA: Diagnosis not present

## 2023-01-01 DIAGNOSIS — N3000 Acute cystitis without hematuria: Secondary | ICD-10-CM | POA: Diagnosis not present

## 2023-01-01 DIAGNOSIS — I482 Chronic atrial fibrillation, unspecified: Secondary | ICD-10-CM | POA: Diagnosis not present

## 2023-01-01 DIAGNOSIS — I700 Atherosclerosis of aorta: Secondary | ICD-10-CM | POA: Diagnosis not present

## 2023-01-01 DIAGNOSIS — N400 Enlarged prostate without lower urinary tract symptoms: Secondary | ICD-10-CM | POA: Diagnosis not present

## 2023-01-04 ENCOUNTER — Ambulatory Visit (INDEPENDENT_AMBULATORY_CARE_PROVIDER_SITE_OTHER): Payer: Medicare Other | Admitting: Family Medicine

## 2023-01-04 ENCOUNTER — Encounter: Payer: Self-pay | Admitting: Family Medicine

## 2023-01-04 VITALS — BP 118/68 | HR 70 | Temp 98.0°F | Ht 73.0 in | Wt 166.2 lb

## 2023-01-04 DIAGNOSIS — R7309 Other abnormal glucose: Secondary | ICD-10-CM | POA: Diagnosis not present

## 2023-01-04 DIAGNOSIS — I2510 Atherosclerotic heart disease of native coronary artery without angina pectoris: Secondary | ICD-10-CM | POA: Diagnosis not present

## 2023-01-04 DIAGNOSIS — I482 Chronic atrial fibrillation, unspecified: Secondary | ICD-10-CM | POA: Diagnosis not present

## 2023-01-04 DIAGNOSIS — R638 Other symptoms and signs concerning food and fluid intake: Secondary | ICD-10-CM | POA: Diagnosis not present

## 2023-01-04 DIAGNOSIS — D72829 Elevated white blood cell count, unspecified: Secondary | ICD-10-CM

## 2023-01-04 DIAGNOSIS — E512 Wernicke's encephalopathy: Secondary | ICD-10-CM

## 2023-01-04 DIAGNOSIS — I10 Essential (primary) hypertension: Secondary | ICD-10-CM

## 2023-01-04 DIAGNOSIS — Z466 Encounter for fitting and adjustment of urinary device: Secondary | ICD-10-CM | POA: Diagnosis not present

## 2023-01-04 DIAGNOSIS — N400 Enlarged prostate without lower urinary tract symptoms: Secondary | ICD-10-CM | POA: Diagnosis not present

## 2023-01-04 DIAGNOSIS — G629 Polyneuropathy, unspecified: Secondary | ICD-10-CM | POA: Diagnosis not present

## 2023-01-04 DIAGNOSIS — I700 Atherosclerosis of aorta: Secondary | ICD-10-CM | POA: Diagnosis not present

## 2023-01-04 MED ORDER — CYANOCOBALAMIN 1000 MCG/ML IJ SOLN: 1000.0000 ug | INTRAMUSCULAR | 3 refills | Status: DC

## 2023-01-04 NOTE — Patient Instructions (Addendum)
Please stop by lab before you go If you have mychart- we will send your results within 3 business days of [place] receiving them.  If you do not have mychart- we will call you about results within 5 business days of [place] receiving them.  *please also note that you will see labs on mychart as soon as they post. I will later go in and write notes on them- will say "notes from Dr. Yong Channel"   Glad you are doing better and I am personally hopeful for further memory recovery  Recommended follow up: Return for next already scheduled visit or sooner if needed.

## 2023-01-04 NOTE — Progress Notes (Signed)
Phone [phone] In person visit   Subjective:   Joseph Simpson is a 88 y.o. year old very pleasant male patient who presents for/with See problem oriented charting Chief Complaint  Patient presents with   Follow-up    Went to ED on 2/1 due to catheter clogging up.   Discuss Ritalin    To stop or continue Ritalin (was told to stop by another provider)   Discuss B12 injection    When it should be given. Today or the end of the month.    Past Medical History-  Patient Active Problem List   Diagnosis Date Noted   GI bleed 01/07/2022    Priority: High   New onset a-fib [facility])     Priority: High   CAD (coronary artery disease)     Priority: High   Macular degeneration 11/17/2011    Priority: High   Hyperglycemia 12/21/2019    Priority: Medium    B12 deficiency 03/09/2019    Priority: Medium    Gastroparesis 02/15/2017    Priority: Medium    Hx of adenomatous colonic polyps 02/15/2017    Priority: Medium    Hyperlipidemia 06/10/2016    Priority: Medium    BPH (benign prostatic hyperplasia) 11/23/2014    Priority: Medium    Palpitations 07/31/2014    Priority: Medium    Dyspnea 02/27/2014    Priority: Medium    Essential hypertension 09/23/2007    Priority: Medium    GERD 09/23/2007    Priority: Medium    Osteoarthritis of left hip 01/30/2019    Priority: Low   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 12/29/2017    Priority: Low   Left inguinal hernia 07/12/2016    Priority: Low   PAC (premature atrial contraction) 10/25/2015    Priority: Low   Squamous cell carcinoma in situ 09/09/2015    Priority: Low   Cough 05/10/2014    Priority: Low   Pulmonary nodule 05/10/2014    Priority: Low   Murmur 02/10/2012    Priority: Low   Left wrist pain 10/01/2021    Priority: 1.   Bilateral foot pain 07/04/2021    Priority: 1.   S/P hip replacement, left 02/11/2021    Priority: 1.   Delirium 11/06/2022   Altered mental status 11/05/2022    Abdominal pain, epigastric 11/02/2022   Intractable nausea and vomiting 10/30/2022   Nausea & vomiting 10/29/2022   Gastritis and duodenitis 10/29/2022    Medications- reviewed and updated Current Outpatient Medications  Medication Sig Dispense Refill   acetaminophen (TYLENOL) 500 MG tablet Take 500-1,000 mg by mouth 2 (two) times daily as needed for mild pain, moderate pain or headache.     apixaban (ELIQUIS) 5 MG TABS tablet Take 1 tablet (5 mg total) by mouth 2 (two) times daily. 180 tablet 3   atorvastatin (LIPITOR) 20 MG tablet TAKE ONE TABLET BY MOUTH THREE TIMES A WEEK 36 tablet 4   BENADRYL ALLERGY 25 MG tablet Take 12.5 mg by mouth See admin instructions. Take 12.5 mg by mouth every 8 hours when taking Prochlorperazine     bevacizumab (AVASTIN) 2.5 mg/0.1 mL SOLN 2.5 mg by Intravitreal route See admin instructions. Injection to eyes every 4 weeks - use with vigamox     Cholecalciferol (VITAMIN D3) 1.25 MG (50000 UT) CAPS Take 1 capsule by mouth once a week.     ciprofloxacin (CIPRO) 500 MG tablet Take 500 mg by mouth 2 (two) times daily.  cyanocobalamin (VITAMIN B12) 1000 MCG/ML injection Inject 1 mL (1,000 mcg total) into the muscle every 30 (thirty) days. By granddaughter who is an RN 3 mL 3   docusate sodium (COLACE) 100 MG capsule Take 200 mg by mouth at bedtime.     losartan (COZAAR) 25 MG tablet Take 0.5-1 tablets (12.5-25 mg total) by mouth daily. If blood pressure over 145 when you check- ok to take other half tablet once per day (so total of 35m per day) (Patient taking differently: Take 12.5 mg by mouth See admin instructions. Take 12.5 mg by mouth once a day and if Systolic B/P number is over 145 when checked, may take an additional 12.5 mg once a day (max total of 25 mg/day)) 135 tablet 3   metoprolol tartrate (LOPRESSOR) 25 MG tablet Take 12.5 mg by mouth 2 (two) times daily.     pantoprazole (PROTONIX) 40 MG tablet Take 1 tablet (40 mg total) by mouth 2 (two) times  daily. 60 tablet 2   polyethylene glycol (MIRALAX / GLYCOLAX) 17 g packet Take 17 g by mouth daily as needed for moderate constipation (mix and drink as directed).     prochlorperazine (COMPAZINE) 10 MG tablet Take 1 tablet (10 mg total) by mouth every 8 (eight) hours as needed for nausea or vomiting (take with 12.532mbenadryl please). 10 tablet 0   SYSTANE BALANCE 0.6 % SOLN Apply to eye.     tamsulosin (FLOMAX) 0.4 MG CAPS capsule Take 0.4 mg by mouth daily.     THERATEARS NIGHTTIME 1 % GEL Apply to eye.     thiamine (VITAMIN B1) 100 MG tablet Take 1 tablet (100 mg total) by mouth daily.     VIGAMOX 0.5 % ophthalmic solution Place 1 drop into both eyes See admin instructions. Instill every 4 weeks as directed - use with avastin, instill 1 drop into both eyes 4 times daily for 2 days after eye injections     No current facility-administered medications for this visit.     Objective:  BP 118/68 (BP Location: Right Arm, Patient Position: Sitting)   Pulse 70   Temp 98 F (36.7 C) (Temporal)   Ht 6' 1"$  (1.854 m)   Wt 166 lb 3.2 oz (75.4 kg)   SpO2 95%   BMI 21.93 kg/m  Gen: NAD, resting comfortably CV: RRR no murmurs rubs or gallops Lungs: CTAB no crackles, wheeze, rhonchi Ext: trace edema Skin: warm, dry Neuro: using walker today- which is new for him    Assessment and Plan   # BPH/urinary obstruction S: Recent obstruction of Foley catheter 12/17/2022-was treated with fosfomycin for potential UTI but fortunately did not have worsening mentation and reportedly had urology follow-up the next day- family reports they saw them last Friday and catheter changed the next week- they are going in every 3 weeks for changes and family will flush -had worsened when in rehab but doing much better now A/P: ongoing BPH- urine is looking clearer with flushes daily planned ideally- but family will at least do every other day unless notes sediment. Continue urology follow up  and tamsulosin 0.4 mg   #  Warnicke's encephalopathy/memory loss S: Patient had 2 admissions in December 2023 (first after endoscopy during postop) which led to this diagnosis.  He was started on thiamine.  He is followed by neurology - family reports no drop off in memory prior to endoscopy. MBR brain 12/13/22 with no signs of prior stroke.  -has not gotten lost driving  -  he is taking b1 185m every other day -was in rehab for a period -now home with 24/7 support from family A/P: Memory loss to me is honestly surprising even with b1 deficiency- pretty significant change for him and I am hopeful perhaps he improves further though neurology thinks unlikely.     #Paroxysmal atrial fibrillation-follows with Dr. JMartiniqueS: Anticoagulation: Eliquis 5 mg twice daily (Does have gastric ulceration history as well as partial removal of stomach (one third) and previously avoided aspirin with CAD) -Rate control: Metoprolol 12.5 mg twice daily A/P: appropriately anticoagulated and rate controlled- continue current medicine  #Hypertension  S: Controlled on losartan 12.5 mg daily (with extra dose 12.5 mg if blood pressure above 145), metoprolol 12.5 mg twice daily (also helps palpitations- reduced from full tablet due to bradycardia 06/2021) BP Readings from Last 3 Encounters:  01/04/23 118/68  12/22/22 138/74  12/17/22 128/72  A/P: stable- continue current medicines      #Hyperlipidemia/CAD-follows with Dr. JMartinique S: Patient has had minimally obstructive CAD on cath 2013.  Medication: Eliquis 5 mg twice daily (cannot use aspirin with prior one third removal of stomach) -Atorvastatin 20 mg 3 times a week  -no chest pain or shortness of breath  Lab Results  Component Value Date   CHOL 113 01/27/2022   HDL 37.30 (L) 01/27/2022   LDLCALC 49 01/27/2022   LDLDIRECT 83 06/19/2020   TRIG 132.0 01/27/2022   CHOLHDL 3 01/27/2022  A/P: cad asymptomatic continue current medications.   Lipids at goal with LDL under 70- continue  current medications   %GERD-sees Dr. AHavery Moros(prior Dr. SErlene Quanin KGoldsby S: Patient is compliant with high-dose PPI-pantoprazole 40 mg twice a day per  GI.  Also on Reglan in past but was stopped by rehab February 2024 and no worsening GI issues A/P: gerd- controlled- stable- continue current medicines - ok to stay off reglan since no worsening stomach issues off of medicine  # Hyperglycemia/insulin resistance/prediabetes- peak a1c 6.1 S:  Medication: none Lab Results  Component Value Date   HGBA1C 5.7 09/21/2022   HGBA1C 5.8 01/27/2022   HGBA1C 6.0 06/27/2021   A/P: too soon to recheck- must wait 6 months for insurance  # B12 deficiency S: Current treatment/medication (oral vs. IM): Monthly B12 injections- was told due at end of month by rehab A/P: they will inject next b12 at end of month.    Recommended follow up: Return for next already scheduled visit or sooner if needed. Future Appointments  Date Time Provider [place] 02/03/2023  8:20 AM MHayden Pedro MD [facility] None  03/22/2023  9:20 AM HMarin Olp MD [facility]  03/29/2023 12:00 PM [facility] None  06/22/2023  9:00 AM WRondel Jumbo PA-C [facility] None   Lab/Order associations:   ICD-10-CM   1. Elevated glucose level  R73.09     2. Leukocytosis, unspecified type  D72.829 CBC with Differential/Platelet    3. Excessive intake of thiamine  R63.8     4. Wernicke encephalopathy  E51.2 Vitamin B1    5. Essential hypertension  I10 Comprehensive metabolic panel    CBC with Differential/Platelet      Meds ordered this encounter  Medications   cyanocobalamin (VITAMIN B12) 1000 MCG/ML injection    Sig: Inject 1 mL (1,000 mcg total) into the muscle every 30 (thirty) days. By granddaughter who is an RTherapist, sports   Dispense:  3 mL    Refill:  3  Please provide syringes or suggested rx for syringes    Return precautions advised.  Garret Reddish, MD

## 2023-01-05 DIAGNOSIS — I700 Atherosclerosis of aorta: Secondary | ICD-10-CM | POA: Diagnosis not present

## 2023-01-05 DIAGNOSIS — I482 Chronic atrial fibrillation, unspecified: Secondary | ICD-10-CM | POA: Diagnosis not present

## 2023-01-05 DIAGNOSIS — N400 Enlarged prostate without lower urinary tract symptoms: Secondary | ICD-10-CM | POA: Diagnosis not present

## 2023-01-05 DIAGNOSIS — I2510 Atherosclerotic heart disease of native coronary artery without angina pectoris: Secondary | ICD-10-CM | POA: Diagnosis not present

## 2023-01-05 DIAGNOSIS — G629 Polyneuropathy, unspecified: Secondary | ICD-10-CM | POA: Diagnosis not present

## 2023-01-05 DIAGNOSIS — Z466 Encounter for fitting and adjustment of urinary device: Secondary | ICD-10-CM | POA: Diagnosis not present

## 2023-01-05 LAB — CBC WITH DIFFERENTIAL/PLATELET
Basophils Absolute: 0 10*3/uL (ref 0.0–0.1)
Basophils Relative: 0.8 % (ref 0.0–3.0)
Eosinophils Absolute: 0.1 10*3/uL (ref 0.0–0.7)
Eosinophils Relative: 2.4 % (ref 0.0–5.0)
HCT: 43.5 % (ref 39.0–52.0)
Hemoglobin: 14.2 g/dL (ref 13.0–17.0)
Lymphocytes Relative: 19.2 % (ref 12.0–46.0)
Lymphs Abs: 1.2 10*3/uL (ref 0.7–4.0)
MCHC: 32.6 g/dL (ref 30.0–36.0)
MCV: 92.1 fl (ref 78.0–100.0)
Monocytes Absolute: 0.5 10*3/uL (ref 0.1–1.0)
Monocytes Relative: 8.7 % (ref 3.0–12.0)
Neutro Abs: 4.2 10*3/uL (ref 1.4–7.7)
Neutrophils Relative %: 68.9 % (ref 43.0–77.0)
Platelets: 198 10*3/uL (ref 150.0–400.0)
RBC: 4.72 Mil/uL (ref 4.22–5.81)
RDW: 13.9 % (ref 11.5–15.5)
WBC: 6.1 10*3/uL (ref 4.0–10.5)

## 2023-01-05 LAB — COMPREHENSIVE METABOLIC PANEL
ALT: 11 U/L (ref 0–53)
AST: 15 U/L (ref 0–37)
Albumin: 4 g/dL (ref 3.5–5.2)
Alkaline Phosphatase: 87 U/L (ref 39–117)
BUN: 8 mg/dL (ref 6–23)
CO2: 32 mEq/L (ref 19–32)
Calcium: 9.5 mg/dL (ref 8.4–10.5)
Chloride: 101 mEq/L (ref 96–112)
Creatinine, Ser: 0.74 mg/dL (ref 0.40–1.50)
GFR: 82.26 mL/min (ref >60.00)
Glucose, Bld: 96 mg/dL (ref 70–99)
Potassium: 3.8 mEq/L (ref 3.5–5.1)
Sodium: 140 mEq/L (ref 135–145)
Total Bilirubin: 1 mg/dL (ref 0.2–1.2)
Total Protein: 6.9 g/dL (ref 6.0–8.3)

## 2023-01-06 DIAGNOSIS — N400 Enlarged prostate without lower urinary tract symptoms: Secondary | ICD-10-CM | POA: Diagnosis not present

## 2023-01-06 DIAGNOSIS — I700 Atherosclerosis of aorta: Secondary | ICD-10-CM | POA: Diagnosis not present

## 2023-01-06 DIAGNOSIS — G629 Polyneuropathy, unspecified: Secondary | ICD-10-CM | POA: Diagnosis not present

## 2023-01-06 DIAGNOSIS — I2510 Atherosclerotic heart disease of native coronary artery without angina pectoris: Secondary | ICD-10-CM | POA: Diagnosis not present

## 2023-01-06 DIAGNOSIS — I482 Chronic atrial fibrillation, unspecified: Secondary | ICD-10-CM | POA: Diagnosis not present

## 2023-01-06 DIAGNOSIS — Z466 Encounter for fitting and adjustment of urinary device: Secondary | ICD-10-CM | POA: Diagnosis not present

## 2023-01-07 LAB — VITAMIN B1: Vitamin B1 (Thiamine): 30 nmol/L (ref 8–30)

## 2023-01-08 DIAGNOSIS — I482 Chronic atrial fibrillation, unspecified: Secondary | ICD-10-CM | POA: Diagnosis not present

## 2023-01-08 DIAGNOSIS — G629 Polyneuropathy, unspecified: Secondary | ICD-10-CM | POA: Diagnosis not present

## 2023-01-08 DIAGNOSIS — Z466 Encounter for fitting and adjustment of urinary device: Secondary | ICD-10-CM | POA: Diagnosis not present

## 2023-01-08 DIAGNOSIS — I700 Atherosclerosis of aorta: Secondary | ICD-10-CM | POA: Diagnosis not present

## 2023-01-08 DIAGNOSIS — I2510 Atherosclerotic heart disease of native coronary artery without angina pectoris: Secondary | ICD-10-CM | POA: Diagnosis not present

## 2023-01-08 DIAGNOSIS — N400 Enlarged prostate without lower urinary tract symptoms: Secondary | ICD-10-CM | POA: Diagnosis not present

## 2023-01-11 DIAGNOSIS — R4181 Age-related cognitive decline: Secondary | ICD-10-CM | POA: Diagnosis not present

## 2023-01-11 DIAGNOSIS — R413 Other amnesia: Secondary | ICD-10-CM | POA: Diagnosis not present

## 2023-01-11 DIAGNOSIS — F02A0 Dementia in other diseases classified elsewhere, mild, without behavioral disturbance, psychotic disturbance, mood disturbance, and anxiety: Secondary | ICD-10-CM | POA: Diagnosis not present

## 2023-01-11 DIAGNOSIS — E512 Wernicke's encephalopathy: Secondary | ICD-10-CM | POA: Diagnosis not present

## 2023-01-11 DIAGNOSIS — I4891 Unspecified atrial fibrillation: Secondary | ICD-10-CM | POA: Diagnosis not present

## 2023-01-11 DIAGNOSIS — G301 Alzheimer's disease with late onset: Secondary | ICD-10-CM | POA: Diagnosis not present

## 2023-01-13 ENCOUNTER — Telehealth: Payer: Self-pay | Admitting: Family Medicine

## 2023-01-13 DIAGNOSIS — I2510 Atherosclerotic heart disease of native coronary artery without angina pectoris: Secondary | ICD-10-CM | POA: Diagnosis not present

## 2023-01-13 DIAGNOSIS — I700 Atherosclerosis of aorta: Secondary | ICD-10-CM | POA: Diagnosis not present

## 2023-01-13 DIAGNOSIS — Z466 Encounter for fitting and adjustment of urinary device: Secondary | ICD-10-CM | POA: Diagnosis not present

## 2023-01-13 DIAGNOSIS — I482 Chronic atrial fibrillation, unspecified: Secondary | ICD-10-CM | POA: Diagnosis not present

## 2023-01-13 DIAGNOSIS — G629 Polyneuropathy, unspecified: Secondary | ICD-10-CM | POA: Diagnosis not present

## 2023-01-13 DIAGNOSIS — N400 Enlarged prostate without lower urinary tract symptoms: Secondary | ICD-10-CM | POA: Diagnosis not present

## 2023-01-13 NOTE — Telephone Encounter (Signed)
Placed on [employer]

## 2023-01-13 NOTE — Telephone Encounter (Signed)
.  Home Health Certification or Plan of Care Tracking  Is this a Certification or Plan of Care? Yes  [place] Agency: [place] Number:  [id number]  Has charge sheet been attached? yes  Where has form been placed:  In provider's box  Faxed to:   [phone]

## 2023-01-14 DIAGNOSIS — Z466 Encounter for fitting and adjustment of urinary device: Secondary | ICD-10-CM | POA: Diagnosis not present

## 2023-01-14 DIAGNOSIS — I700 Atherosclerosis of aorta: Secondary | ICD-10-CM | POA: Diagnosis not present

## 2023-01-14 DIAGNOSIS — I2510 Atherosclerotic heart disease of native coronary artery without angina pectoris: Secondary | ICD-10-CM | POA: Diagnosis not present

## 2023-01-14 DIAGNOSIS — N400 Enlarged prostate without lower urinary tract symptoms: Secondary | ICD-10-CM | POA: Diagnosis not present

## 2023-01-14 DIAGNOSIS — I482 Chronic atrial fibrillation, unspecified: Secondary | ICD-10-CM | POA: Diagnosis not present

## 2023-01-14 DIAGNOSIS — G629 Polyneuropathy, unspecified: Secondary | ICD-10-CM | POA: Diagnosis not present

## 2023-01-18 DIAGNOSIS — M1990 Unspecified osteoarthritis, unspecified site: Secondary | ICD-10-CM | POA: Diagnosis not present

## 2023-01-18 DIAGNOSIS — K219 Gastro-esophageal reflux disease without esophagitis: Secondary | ICD-10-CM | POA: Diagnosis not present

## 2023-01-18 DIAGNOSIS — G4733 Obstructive sleep apnea (adult) (pediatric): Secondary | ICD-10-CM | POA: Diagnosis not present

## 2023-01-18 DIAGNOSIS — Z87891 Personal history of nicotine dependence: Secondary | ICD-10-CM | POA: Diagnosis not present

## 2023-01-18 DIAGNOSIS — I2510 Atherosclerotic heart disease of native coronary artery without angina pectoris: Secondary | ICD-10-CM | POA: Diagnosis not present

## 2023-01-18 DIAGNOSIS — N400 Enlarged prostate without lower urinary tract symptoms: Secondary | ICD-10-CM | POA: Diagnosis not present

## 2023-01-18 DIAGNOSIS — I482 Chronic atrial fibrillation, unspecified: Secondary | ICD-10-CM | POA: Diagnosis not present

## 2023-01-18 DIAGNOSIS — I700 Atherosclerosis of aorta: Secondary | ICD-10-CM | POA: Diagnosis not present

## 2023-01-18 DIAGNOSIS — E538 Deficiency of other specified B group vitamins: Secondary | ICD-10-CM | POA: Diagnosis not present

## 2023-01-18 DIAGNOSIS — Z9181 History of falling: Secondary | ICD-10-CM | POA: Diagnosis not present

## 2023-01-18 DIAGNOSIS — K5790 Diverticulosis of intestine, part unspecified, without perforation or abscess without bleeding: Secondary | ICD-10-CM | POA: Diagnosis not present

## 2023-01-18 DIAGNOSIS — Z7901 Long term (current) use of anticoagulants: Secondary | ICD-10-CM | POA: Diagnosis not present

## 2023-01-18 DIAGNOSIS — I10 Essential (primary) hypertension: Secondary | ICD-10-CM | POA: Diagnosis not present

## 2023-01-18 DIAGNOSIS — Z466 Encounter for fitting and adjustment of urinary device: Secondary | ICD-10-CM | POA: Diagnosis not present

## 2023-01-18 DIAGNOSIS — E785 Hyperlipidemia, unspecified: Secondary | ICD-10-CM | POA: Diagnosis not present

## 2023-01-18 DIAGNOSIS — Z87442 Personal history of urinary calculi: Secondary | ICD-10-CM | POA: Diagnosis not present

## 2023-01-18 DIAGNOSIS — H3530 Unspecified macular degeneration: Secondary | ICD-10-CM | POA: Diagnosis not present

## 2023-01-18 DIAGNOSIS — G629 Polyneuropathy, unspecified: Secondary | ICD-10-CM | POA: Diagnosis not present

## 2023-01-18 DIAGNOSIS — E559 Vitamin D deficiency, unspecified: Secondary | ICD-10-CM | POA: Diagnosis not present

## 2023-01-19 DIAGNOSIS — I482 Chronic atrial fibrillation, unspecified: Secondary | ICD-10-CM | POA: Diagnosis not present

## 2023-01-19 DIAGNOSIS — N400 Enlarged prostate without lower urinary tract symptoms: Secondary | ICD-10-CM | POA: Diagnosis not present

## 2023-01-19 DIAGNOSIS — I2510 Atherosclerotic heart disease of native coronary artery without angina pectoris: Secondary | ICD-10-CM | POA: Diagnosis not present

## 2023-01-19 DIAGNOSIS — G629 Polyneuropathy, unspecified: Secondary | ICD-10-CM | POA: Diagnosis not present

## 2023-01-19 DIAGNOSIS — I700 Atherosclerosis of aorta: Secondary | ICD-10-CM | POA: Diagnosis not present

## 2023-01-19 DIAGNOSIS — Z466 Encounter for fitting and adjustment of urinary device: Secondary | ICD-10-CM | POA: Diagnosis not present

## 2023-01-21 DIAGNOSIS — I482 Chronic atrial fibrillation, unspecified: Secondary | ICD-10-CM | POA: Diagnosis not present

## 2023-01-21 DIAGNOSIS — Z466 Encounter for fitting and adjustment of urinary device: Secondary | ICD-10-CM | POA: Diagnosis not present

## 2023-01-21 DIAGNOSIS — I2510 Atherosclerotic heart disease of native coronary artery without angina pectoris: Secondary | ICD-10-CM | POA: Diagnosis not present

## 2023-01-21 DIAGNOSIS — G629 Polyneuropathy, unspecified: Secondary | ICD-10-CM | POA: Diagnosis not present

## 2023-01-21 DIAGNOSIS — I700 Atherosclerosis of aorta: Secondary | ICD-10-CM | POA: Diagnosis not present

## 2023-01-21 DIAGNOSIS — N400 Enlarged prostate without lower urinary tract symptoms: Secondary | ICD-10-CM | POA: Diagnosis not present

## 2023-01-25 ENCOUNTER — Telehealth: Payer: Self-pay | Admitting: Pharmacist

## 2023-01-25 DIAGNOSIS — I482 Chronic atrial fibrillation, unspecified: Secondary | ICD-10-CM | POA: Diagnosis not present

## 2023-01-25 DIAGNOSIS — I700 Atherosclerosis of aorta: Secondary | ICD-10-CM | POA: Diagnosis not present

## 2023-01-25 DIAGNOSIS — I2510 Atherosclerotic heart disease of native coronary artery without angina pectoris: Secondary | ICD-10-CM | POA: Diagnosis not present

## 2023-01-25 DIAGNOSIS — G629 Polyneuropathy, unspecified: Secondary | ICD-10-CM | POA: Diagnosis not present

## 2023-01-25 DIAGNOSIS — Z466 Encounter for fitting and adjustment of urinary device: Secondary | ICD-10-CM | POA: Diagnosis not present

## 2023-01-25 DIAGNOSIS — N400 Enlarged prostate without lower urinary tract symptoms: Secondary | ICD-10-CM | POA: Diagnosis not present

## 2023-01-25 NOTE — Progress Notes (Signed)
Care Management & Coordination Services Pharmacy Team  Reason for Encounter: General adherence update   Contacted patient for general health update and medication adherence call.  Spoke with family on 01/25/2023    What concerns do you have about your medications? None  The patient denies side effects with their medications.   How often do you forget or accidentally miss a dose? Rarely  Do you use a pillbox? Yes  Are you having any problems getting your medications from your pharmacy? No  Has the cost of your medications been a concern? No If yes, what medication and is patient assistance available or has it been applied for?  Since last visit with PharmD, no interventions have been made.   The patient has had an ED visit since last contact.   The patient reports the following problems with their health. Patient has some memory issues, he is unsure why.  Patient denies concerns or questions for Leata Mouse, PharmD at this time.   Counseled patient on: Access to carecoordination team for any cost, medication or pharmacy concerns.   Chart Updates:  Recent office visits:  01/04/2023 OV (PCP) Marin Olp, MD; no medication changes indicated.  Recent consult visits:  12/22/2022 OV (Neurology) Rondel Jumbo, PA-C; no medication changes indicated.  12/08/2022 OV (Gastro) [place], [place], [place]; no medication changes indicated.  Hospital visits:  12/17/2022 ED visit for Foley catheter problem -No medication changes indicated.  Medications: Outpatient Encounter Medications as of 01/25/2023  Medication Sig   acetaminophen (TYLENOL) 500 MG tablet Take 500-1,000 mg by mouth 2 (two) times daily as needed for mild pain, moderate pain or headache.   apixaban (ELIQUIS) 5 MG TABS tablet Take 1 tablet (5 mg total) by mouth 2 (two) times daily.   atorvastatin (LIPITOR) 20 MG tablet TAKE ONE TABLET BY MOUTH THREE TIMES A WEEK   BENADRYL ALLERGY 25 MG tablet Take 12.5 mg by  mouth See admin instructions. Take 12.5 mg by mouth every 8 hours when taking Prochlorperazine   bevacizumab (AVASTIN) 2.5 mg/0.1 mL SOLN 2.5 mg by Intravitreal route See admin instructions. Injection to eyes every 4 weeks - use with vigamox   Cholecalciferol (VITAMIN D3) 1.25 MG (50000 UT) CAPS Take 1 capsule by mouth once a week.   ciprofloxacin (CIPRO) 500 MG tablet Take 500 mg by mouth 2 (two) times daily.   cyanocobalamin (VITAMIN B12) 1000 MCG/ML injection Inject 1 mL (1,000 mcg total) into the muscle every 30 (thirty) days. By granddaughter who is an Therapist, sports   docusate sodium (COLACE) 100 MG capsule Take 200 mg by mouth at bedtime.   losartan (COZAAR) 25 MG tablet Take 0.5-1 tablets (12.5-25 mg total) by mouth daily. If blood pressure over 145 when you check- ok to take other half tablet once per day (so total of '25mg'$  per day) (Patient taking differently: Take 12.5 mg by mouth See admin instructions. Take 12.5 mg by mouth once a day and if Systolic B/P number is over 145 when checked, may take an additional 12.5 mg once a day (max total of 25 mg/day))   metoprolol tartrate (LOPRESSOR) 25 MG tablet Take 12.5 mg by mouth 2 (two) times daily.   pantoprazole (PROTONIX) 40 MG tablet Take 1 tablet (40 mg total) by mouth 2 (two) times daily.   polyethylene glycol (MIRALAX / GLYCOLAX) 17 g packet Take 17 g by mouth daily as needed for moderate constipation (mix and drink as directed).   prochlorperazine (COMPAZINE) 10 MG tablet Take  1 tablet (10 mg total) by mouth every 8 (eight) hours as needed for nausea or vomiting (take with 12.'5mg'$  benadryl please).   SYSTANE BALANCE 0.6 % SOLN Apply to eye.   tamsulosin (FLOMAX) 0.4 MG CAPS capsule Take 0.4 mg by mouth daily.   THERATEARS NIGHTTIME 1 % GEL Apply to eye.   thiamine (VITAMIN B1) 100 MG tablet Take 1 tablet (100 mg total) by mouth daily.   VIGAMOX 0.5 % ophthalmic solution Place 1 drop into both eyes See admin instructions. Instill every 4 weeks as  directed - use with avastin, instill 1 drop into both eyes 4 times daily for 2 days after eye injections   No facility-administered encounter medications on file as of 01/25/2023.    Recent vitals BP Readings from Last 3 Encounters:  01/04/23 118/68  12/22/22 138/74  12/17/22 128/72   Pulse Readings from Last 3 Encounters:  01/04/23 70  12/22/22 100  12/17/22 93   Wt Readings from Last 3 Encounters:  01/04/23 166 lb 3.2 oz (75.4 kg)  12/22/22 164 lb (74.4 kg)  12/17/22 169 lb 5 oz (76.8 kg)   BMI Readings from Last 3 Encounters:  01/04/23 21.93 kg/m  12/22/22 21.64 kg/m  12/17/22 22.34 kg/m    Recent lab results    Component Value Date/Time   NA 140 01/04/2023 1429   K 3.8 01/04/2023 1429   CL 101 01/04/2023 1429   CO2 32 01/04/2023 1429   GLUCOSE 96 01/04/2023 1429   BUN 8 01/04/2023 1429   CREATININE 0.74 01/04/2023 1429   CREATININE 0.80 06/19/2020 1033   CALCIUM 9.5 01/04/2023 1429    Lab Results  Component Value Date   CREATININE 0.74 01/04/2023   GFR 82.26 01/04/2023   GFRNONAA >60 12/17/2022   GFRAA >60 12/15/2017   Lab Results  Component Value Date/Time   HGBA1C 5.7 09/21/2022 09:57 AM   HGBA1C 5.8 01/27/2022 11:14 AM    Lab Results  Component Value Date   CHOL 113 01/27/2022   HDL 37.30 (L) 01/27/2022   LDLCALC 49 01/27/2022   LDLDIRECT 83 06/19/2020   TRIG 132.0 01/27/2022   CHOLHDL 3 01/27/2022    Care Gaps: Annual wellness visit in last year? Yes  Star Rating Drugs:  Atorvastatin 20 mg last filled 12/05/2022 28 DS Losartan 25 mg last filled 12/11/2022 90 DS   Future Appointments  Date Time Provider [place]  02/03/2023  8:20 AM Hayden Pedro, MD [facility] None  03/22/2023  9:20 AM Marin Olp, MD [facility]  03/29/2023 12:00 PM Edythe Clarity, RPH [facility] None  06/22/2023  9:00 AM Rondel Jumbo, PA-C [facility] None   April D Calhoun, [place] Pharmacist Assistant [phone]

## 2023-01-28 ENCOUNTER — Other Ambulatory Visit: Payer: Self-pay | Admitting: Cardiology

## 2023-01-28 DIAGNOSIS — N400 Enlarged prostate without lower urinary tract symptoms: Secondary | ICD-10-CM | POA: Diagnosis not present

## 2023-01-28 DIAGNOSIS — I482 Chronic atrial fibrillation, unspecified: Secondary | ICD-10-CM | POA: Diagnosis not present

## 2023-01-28 DIAGNOSIS — G629 Polyneuropathy, unspecified: Secondary | ICD-10-CM | POA: Diagnosis not present

## 2023-01-28 DIAGNOSIS — F02A0 Dementia in other diseases classified elsewhere, mild, without behavioral disturbance, psychotic disturbance, mood disturbance, and anxiety: Secondary | ICD-10-CM | POA: Diagnosis not present

## 2023-01-28 DIAGNOSIS — Z466 Encounter for fitting and adjustment of urinary device: Secondary | ICD-10-CM | POA: Diagnosis not present

## 2023-01-28 DIAGNOSIS — G301 Alzheimer's disease with late onset: Secondary | ICD-10-CM | POA: Diagnosis not present

## 2023-01-28 DIAGNOSIS — I700 Atherosclerosis of aorta: Secondary | ICD-10-CM | POA: Diagnosis not present

## 2023-01-28 DIAGNOSIS — I2510 Atherosclerotic heart disease of native coronary artery without angina pectoris: Secondary | ICD-10-CM | POA: Diagnosis not present

## 2023-02-01 DIAGNOSIS — N400 Enlarged prostate without lower urinary tract symptoms: Secondary | ICD-10-CM | POA: Diagnosis not present

## 2023-02-01 DIAGNOSIS — G629 Polyneuropathy, unspecified: Secondary | ICD-10-CM | POA: Diagnosis not present

## 2023-02-01 DIAGNOSIS — I2510 Atherosclerotic heart disease of native coronary artery without angina pectoris: Secondary | ICD-10-CM | POA: Diagnosis not present

## 2023-02-01 DIAGNOSIS — I700 Atherosclerosis of aorta: Secondary | ICD-10-CM | POA: Diagnosis not present

## 2023-02-01 DIAGNOSIS — Z466 Encounter for fitting and adjustment of urinary device: Secondary | ICD-10-CM | POA: Diagnosis not present

## 2023-02-01 DIAGNOSIS — I482 Chronic atrial fibrillation, unspecified: Secondary | ICD-10-CM | POA: Diagnosis not present

## 2023-02-02 DIAGNOSIS — G629 Polyneuropathy, unspecified: Secondary | ICD-10-CM | POA: Diagnosis not present

## 2023-02-02 DIAGNOSIS — I700 Atherosclerosis of aorta: Secondary | ICD-10-CM | POA: Diagnosis not present

## 2023-02-02 DIAGNOSIS — N400 Enlarged prostate without lower urinary tract symptoms: Secondary | ICD-10-CM | POA: Diagnosis not present

## 2023-02-02 DIAGNOSIS — I482 Chronic atrial fibrillation, unspecified: Secondary | ICD-10-CM | POA: Diagnosis not present

## 2023-02-02 DIAGNOSIS — I2510 Atherosclerotic heart disease of native coronary artery without angina pectoris: Secondary | ICD-10-CM | POA: Diagnosis not present

## 2023-02-02 DIAGNOSIS — Z466 Encounter for fitting and adjustment of urinary device: Secondary | ICD-10-CM | POA: Diagnosis not present

## 2023-02-03 ENCOUNTER — Encounter (INDEPENDENT_AMBULATORY_CARE_PROVIDER_SITE_OTHER): Payer: Medicare Other | Admitting: Ophthalmology

## 2023-02-03 DIAGNOSIS — H35033 Hypertensive retinopathy, bilateral: Secondary | ICD-10-CM | POA: Diagnosis not present

## 2023-02-03 DIAGNOSIS — H353231 Exudative age-related macular degeneration, bilateral, with active choroidal neovascularization: Secondary | ICD-10-CM

## 2023-02-03 DIAGNOSIS — D3132 Benign neoplasm of left choroid: Secondary | ICD-10-CM | POA: Diagnosis not present

## 2023-02-03 DIAGNOSIS — H43813 Vitreous degeneration, bilateral: Secondary | ICD-10-CM | POA: Diagnosis not present

## 2023-02-03 DIAGNOSIS — I10 Essential (primary) hypertension: Secondary | ICD-10-CM

## 2023-02-04 DIAGNOSIS — D099 Carcinoma in situ, unspecified: Secondary | ICD-10-CM | POA: Diagnosis not present

## 2023-02-04 DIAGNOSIS — L570 Actinic keratosis: Secondary | ICD-10-CM | POA: Diagnosis not present

## 2023-02-04 DIAGNOSIS — D0461 Carcinoma in situ of skin of right upper limb, including shoulder: Secondary | ICD-10-CM | POA: Diagnosis not present

## 2023-02-08 ENCOUNTER — Telehealth: Payer: Self-pay | Admitting: Family Medicine

## 2023-02-08 DIAGNOSIS — I2510 Atherosclerotic heart disease of native coronary artery without angina pectoris: Secondary | ICD-10-CM | POA: Diagnosis not present

## 2023-02-08 DIAGNOSIS — Z466 Encounter for fitting and adjustment of urinary device: Secondary | ICD-10-CM | POA: Diagnosis not present

## 2023-02-08 DIAGNOSIS — G629 Polyneuropathy, unspecified: Secondary | ICD-10-CM | POA: Diagnosis not present

## 2023-02-08 DIAGNOSIS — N400 Enlarged prostate without lower urinary tract symptoms: Secondary | ICD-10-CM | POA: Diagnosis not present

## 2023-02-08 DIAGNOSIS — I482 Chronic atrial fibrillation, unspecified: Secondary | ICD-10-CM | POA: Diagnosis not present

## 2023-02-08 DIAGNOSIS — I700 Atherosclerosis of aorta: Secondary | ICD-10-CM | POA: Diagnosis not present

## 2023-02-08 NOTE — Telephone Encounter (Signed)
Returned call and provided VO.  

## 2023-02-08 NOTE — Telephone Encounter (Signed)
Home Health Verbal Orders  Agency:  [place] and title  Ph# [phone] -PT  Requesting Additional PT:    Reason for Request:  Additional work on his balance and stability with gait  Frequency:   1 x per week x 1 week beginning week of [id number], then to re-certify 1 week 8 beginning week of 02/22/23  HH needs F2F w/in last 30 days

## 2023-02-11 DIAGNOSIS — Z466 Encounter for fitting and adjustment of urinary device: Secondary | ICD-10-CM | POA: Diagnosis not present

## 2023-02-11 DIAGNOSIS — I700 Atherosclerosis of aorta: Secondary | ICD-10-CM | POA: Diagnosis not present

## 2023-02-11 DIAGNOSIS — I482 Chronic atrial fibrillation, unspecified: Secondary | ICD-10-CM | POA: Diagnosis not present

## 2023-02-11 DIAGNOSIS — I2510 Atherosclerotic heart disease of native coronary artery without angina pectoris: Secondary | ICD-10-CM | POA: Diagnosis not present

## 2023-02-11 DIAGNOSIS — G629 Polyneuropathy, unspecified: Secondary | ICD-10-CM | POA: Diagnosis not present

## 2023-02-11 DIAGNOSIS — N400 Enlarged prostate without lower urinary tract symptoms: Secondary | ICD-10-CM | POA: Diagnosis not present

## 2023-02-15 DIAGNOSIS — N400 Enlarged prostate without lower urinary tract symptoms: Secondary | ICD-10-CM | POA: Diagnosis not present

## 2023-02-15 DIAGNOSIS — I482 Chronic atrial fibrillation, unspecified: Secondary | ICD-10-CM | POA: Diagnosis not present

## 2023-02-15 DIAGNOSIS — I700 Atherosclerosis of aorta: Secondary | ICD-10-CM | POA: Diagnosis not present

## 2023-02-15 DIAGNOSIS — Z466 Encounter for fitting and adjustment of urinary device: Secondary | ICD-10-CM | POA: Diagnosis not present

## 2023-02-15 DIAGNOSIS — G629 Polyneuropathy, unspecified: Secondary | ICD-10-CM | POA: Diagnosis not present

## 2023-02-15 DIAGNOSIS — I2510 Atherosclerotic heart disease of native coronary artery without angina pectoris: Secondary | ICD-10-CM | POA: Diagnosis not present

## 2023-02-16 ENCOUNTER — Telehealth: Payer: Self-pay | Admitting: Family Medicine

## 2023-02-16 DIAGNOSIS — Z466 Encounter for fitting and adjustment of urinary device: Secondary | ICD-10-CM | POA: Diagnosis not present

## 2023-02-16 DIAGNOSIS — G629 Polyneuropathy, unspecified: Secondary | ICD-10-CM | POA: Diagnosis not present

## 2023-02-16 DIAGNOSIS — I2510 Atherosclerotic heart disease of native coronary artery without angina pectoris: Secondary | ICD-10-CM | POA: Diagnosis not present

## 2023-02-16 DIAGNOSIS — I700 Atherosclerosis of aorta: Secondary | ICD-10-CM | POA: Diagnosis not present

## 2023-02-16 DIAGNOSIS — N400 Enlarged prostate without lower urinary tract symptoms: Secondary | ICD-10-CM | POA: Diagnosis not present

## 2023-02-16 DIAGNOSIS — I482 Chronic atrial fibrillation, unspecified: Secondary | ICD-10-CM | POA: Diagnosis not present

## 2023-02-16 NOTE — Telephone Encounter (Signed)
Called and spoke with Malachy Mood and VO given.

## 2023-02-16 NOTE — Telephone Encounter (Signed)
Home Health Verbal Orders  Agency:  [place]: Time and title  Ph# [phone] -RN  Requesting Skilled nursing:    Reason for Request:  Education on Hypertension and Medications  Frequency:  1 x per week for 9 weeks  HH needs F2F w/in last 30 days

## 2023-02-17 DIAGNOSIS — I10 Essential (primary) hypertension: Secondary | ICD-10-CM | POA: Diagnosis not present

## 2023-02-17 DIAGNOSIS — Z87891 Personal history of nicotine dependence: Secondary | ICD-10-CM | POA: Diagnosis not present

## 2023-02-17 DIAGNOSIS — G4733 Obstructive sleep apnea (adult) (pediatric): Secondary | ICD-10-CM | POA: Diagnosis not present

## 2023-02-17 DIAGNOSIS — K5790 Diverticulosis of intestine, part unspecified, without perforation or abscess without bleeding: Secondary | ICD-10-CM | POA: Diagnosis not present

## 2023-02-17 DIAGNOSIS — Z87442 Personal history of urinary calculi: Secondary | ICD-10-CM | POA: Diagnosis not present

## 2023-02-17 DIAGNOSIS — N400 Enlarged prostate without lower urinary tract symptoms: Secondary | ICD-10-CM | POA: Diagnosis not present

## 2023-02-17 DIAGNOSIS — Z7901 Long term (current) use of anticoagulants: Secondary | ICD-10-CM | POA: Diagnosis not present

## 2023-02-17 DIAGNOSIS — K219 Gastro-esophageal reflux disease without esophagitis: Secondary | ICD-10-CM | POA: Diagnosis not present

## 2023-02-17 DIAGNOSIS — M1990 Unspecified osteoarthritis, unspecified site: Secondary | ICD-10-CM | POA: Diagnosis not present

## 2023-02-17 DIAGNOSIS — E559 Vitamin D deficiency, unspecified: Secondary | ICD-10-CM | POA: Diagnosis not present

## 2023-02-17 DIAGNOSIS — G629 Polyneuropathy, unspecified: Secondary | ICD-10-CM | POA: Diagnosis not present

## 2023-02-17 DIAGNOSIS — I700 Atherosclerosis of aorta: Secondary | ICD-10-CM | POA: Diagnosis not present

## 2023-02-17 DIAGNOSIS — I2510 Atherosclerotic heart disease of native coronary artery without angina pectoris: Secondary | ICD-10-CM | POA: Diagnosis not present

## 2023-02-17 DIAGNOSIS — E785 Hyperlipidemia, unspecified: Secondary | ICD-10-CM | POA: Diagnosis not present

## 2023-02-17 DIAGNOSIS — I482 Chronic atrial fibrillation, unspecified: Secondary | ICD-10-CM | POA: Diagnosis not present

## 2023-02-17 DIAGNOSIS — Z466 Encounter for fitting and adjustment of urinary device: Secondary | ICD-10-CM | POA: Diagnosis not present

## 2023-02-17 DIAGNOSIS — H3530 Unspecified macular degeneration: Secondary | ICD-10-CM | POA: Diagnosis not present

## 2023-02-17 DIAGNOSIS — E538 Deficiency of other specified B group vitamins: Secondary | ICD-10-CM | POA: Diagnosis not present

## 2023-02-22 DIAGNOSIS — I700 Atherosclerosis of aorta: Secondary | ICD-10-CM | POA: Diagnosis not present

## 2023-02-22 DIAGNOSIS — G629 Polyneuropathy, unspecified: Secondary | ICD-10-CM | POA: Diagnosis not present

## 2023-02-22 DIAGNOSIS — Z466 Encounter for fitting and adjustment of urinary device: Secondary | ICD-10-CM | POA: Diagnosis not present

## 2023-02-22 DIAGNOSIS — I482 Chronic atrial fibrillation, unspecified: Secondary | ICD-10-CM | POA: Diagnosis not present

## 2023-02-22 DIAGNOSIS — I2510 Atherosclerotic heart disease of native coronary artery without angina pectoris: Secondary | ICD-10-CM | POA: Diagnosis not present

## 2023-02-22 DIAGNOSIS — N400 Enlarged prostate without lower urinary tract symptoms: Secondary | ICD-10-CM | POA: Diagnosis not present

## 2023-03-01 DIAGNOSIS — M792 Neuralgia and neuritis, unspecified: Secondary | ICD-10-CM | POA: Diagnosis not present

## 2023-03-01 DIAGNOSIS — N400 Enlarged prostate without lower urinary tract symptoms: Secondary | ICD-10-CM | POA: Diagnosis not present

## 2023-03-01 DIAGNOSIS — I2510 Atherosclerotic heart disease of native coronary artery without angina pectoris: Secondary | ICD-10-CM | POA: Diagnosis not present

## 2023-03-01 DIAGNOSIS — I482 Chronic atrial fibrillation, unspecified: Secondary | ICD-10-CM | POA: Diagnosis not present

## 2023-03-01 DIAGNOSIS — I739 Peripheral vascular disease, unspecified: Secondary | ICD-10-CM | POA: Diagnosis not present

## 2023-03-01 DIAGNOSIS — I700 Atherosclerosis of aorta: Secondary | ICD-10-CM | POA: Diagnosis not present

## 2023-03-01 DIAGNOSIS — G629 Polyneuropathy, unspecified: Secondary | ICD-10-CM | POA: Diagnosis not present

## 2023-03-01 DIAGNOSIS — Z466 Encounter for fitting and adjustment of urinary device: Secondary | ICD-10-CM | POA: Diagnosis not present

## 2023-03-01 DIAGNOSIS — B351 Tinea unguium: Secondary | ICD-10-CM | POA: Diagnosis not present

## 2023-03-03 DIAGNOSIS — G629 Polyneuropathy, unspecified: Secondary | ICD-10-CM | POA: Diagnosis not present

## 2023-03-03 DIAGNOSIS — N400 Enlarged prostate without lower urinary tract symptoms: Secondary | ICD-10-CM | POA: Diagnosis not present

## 2023-03-03 DIAGNOSIS — Z466 Encounter for fitting and adjustment of urinary device: Secondary | ICD-10-CM | POA: Diagnosis not present

## 2023-03-03 DIAGNOSIS — I482 Chronic atrial fibrillation, unspecified: Secondary | ICD-10-CM | POA: Diagnosis not present

## 2023-03-03 DIAGNOSIS — I2510 Atherosclerotic heart disease of native coronary artery without angina pectoris: Secondary | ICD-10-CM | POA: Diagnosis not present

## 2023-03-03 DIAGNOSIS — I700 Atherosclerosis of aorta: Secondary | ICD-10-CM | POA: Diagnosis not present

## 2023-03-04 ENCOUNTER — Telehealth: Payer: Self-pay | Admitting: Family Medicine

## 2023-03-04 NOTE — Telephone Encounter (Signed)
Gunnar Fusi [facility]) is requesting Speech Therapy for pt. 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  Please call [phone]

## 2023-03-05 NOTE — Telephone Encounter (Signed)
May provide verbal orders

## 2023-03-08 DIAGNOSIS — I2510 Atherosclerotic heart disease of native coronary artery without angina pectoris: Secondary | ICD-10-CM | POA: Diagnosis not present

## 2023-03-08 DIAGNOSIS — I482 Chronic atrial fibrillation, unspecified: Secondary | ICD-10-CM | POA: Diagnosis not present

## 2023-03-08 DIAGNOSIS — G629 Polyneuropathy, unspecified: Secondary | ICD-10-CM | POA: Diagnosis not present

## 2023-03-08 DIAGNOSIS — N400 Enlarged prostate without lower urinary tract symptoms: Secondary | ICD-10-CM | POA: Diagnosis not present

## 2023-03-08 DIAGNOSIS — I700 Atherosclerosis of aorta: Secondary | ICD-10-CM | POA: Diagnosis not present

## 2023-03-08 DIAGNOSIS — Z466 Encounter for fitting and adjustment of urinary device: Secondary | ICD-10-CM | POA: Diagnosis not present

## 2023-03-08 NOTE — Telephone Encounter (Signed)
OK verbal orders given  

## 2023-03-09 DIAGNOSIS — Z466 Encounter for fitting and adjustment of urinary device: Secondary | ICD-10-CM | POA: Diagnosis not present

## 2023-03-09 DIAGNOSIS — N400 Enlarged prostate without lower urinary tract symptoms: Secondary | ICD-10-CM | POA: Diagnosis not present

## 2023-03-09 DIAGNOSIS — I700 Atherosclerosis of aorta: Secondary | ICD-10-CM | POA: Diagnosis not present

## 2023-03-09 DIAGNOSIS — I2510 Atherosclerotic heart disease of native coronary artery without angina pectoris: Secondary | ICD-10-CM | POA: Diagnosis not present

## 2023-03-09 DIAGNOSIS — G629 Polyneuropathy, unspecified: Secondary | ICD-10-CM | POA: Diagnosis not present

## 2023-03-09 DIAGNOSIS — I482 Chronic atrial fibrillation, unspecified: Secondary | ICD-10-CM | POA: Diagnosis not present

## 2023-03-15 ENCOUNTER — Other Ambulatory Visit: Payer: Self-pay | Admitting: Cardiology

## 2023-03-15 DIAGNOSIS — I700 Atherosclerosis of aorta: Secondary | ICD-10-CM | POA: Diagnosis not present

## 2023-03-15 DIAGNOSIS — I482 Chronic atrial fibrillation, unspecified: Secondary | ICD-10-CM | POA: Diagnosis not present

## 2023-03-15 DIAGNOSIS — Z466 Encounter for fitting and adjustment of urinary device: Secondary | ICD-10-CM | POA: Diagnosis not present

## 2023-03-15 DIAGNOSIS — I2510 Atherosclerotic heart disease of native coronary artery without angina pectoris: Secondary | ICD-10-CM | POA: Diagnosis not present

## 2023-03-15 DIAGNOSIS — I480 Paroxysmal atrial fibrillation: Secondary | ICD-10-CM

## 2023-03-15 DIAGNOSIS — N400 Enlarged prostate without lower urinary tract symptoms: Secondary | ICD-10-CM | POA: Diagnosis not present

## 2023-03-15 DIAGNOSIS — G629 Polyneuropathy, unspecified: Secondary | ICD-10-CM | POA: Diagnosis not present

## 2023-03-15 DIAGNOSIS — I4891 Unspecified atrial fibrillation: Secondary | ICD-10-CM

## 2023-03-15 NOTE — Telephone Encounter (Signed)
Eliquis 5mg  refill request received. Patient is 88 years old, weight-75.4kg, Crea-0.74 on 01/04/23, Diagnosis-Afib, and last seen by Dr [place] on 09/28/22. Dose is appropriate based on dosing criteria. Will send in refill to requested pharmacy.

## 2023-03-16 DIAGNOSIS — G629 Polyneuropathy, unspecified: Secondary | ICD-10-CM | POA: Diagnosis not present

## 2023-03-16 DIAGNOSIS — Z466 Encounter for fitting and adjustment of urinary device: Secondary | ICD-10-CM | POA: Diagnosis not present

## 2023-03-16 DIAGNOSIS — I2510 Atherosclerotic heart disease of native coronary artery without angina pectoris: Secondary | ICD-10-CM | POA: Diagnosis not present

## 2023-03-16 DIAGNOSIS — I482 Chronic atrial fibrillation, unspecified: Secondary | ICD-10-CM | POA: Diagnosis not present

## 2023-03-16 DIAGNOSIS — N400 Enlarged prostate without lower urinary tract symptoms: Secondary | ICD-10-CM | POA: Diagnosis not present

## 2023-03-16 DIAGNOSIS — I700 Atherosclerosis of aorta: Secondary | ICD-10-CM | POA: Diagnosis not present

## 2023-03-17 ENCOUNTER — Telehealth: Payer: Self-pay | Admitting: Family Medicine

## 2023-03-17 ENCOUNTER — Encounter (INDEPENDENT_AMBULATORY_CARE_PROVIDER_SITE_OTHER): Payer: Medicare Other | Admitting: Ophthalmology

## 2023-03-17 DIAGNOSIS — I482 Chronic atrial fibrillation, unspecified: Secondary | ICD-10-CM | POA: Diagnosis not present

## 2023-03-17 DIAGNOSIS — H33301 Unspecified retinal break, right eye: Secondary | ICD-10-CM | POA: Diagnosis not present

## 2023-03-17 DIAGNOSIS — I2510 Atherosclerotic heart disease of native coronary artery without angina pectoris: Secondary | ICD-10-CM | POA: Diagnosis not present

## 2023-03-17 DIAGNOSIS — H35033 Hypertensive retinopathy, bilateral: Secondary | ICD-10-CM

## 2023-03-17 DIAGNOSIS — G629 Polyneuropathy, unspecified: Secondary | ICD-10-CM | POA: Diagnosis not present

## 2023-03-17 DIAGNOSIS — I10 Essential (primary) hypertension: Secondary | ICD-10-CM

## 2023-03-17 DIAGNOSIS — N400 Enlarged prostate without lower urinary tract symptoms: Secondary | ICD-10-CM | POA: Diagnosis not present

## 2023-03-17 DIAGNOSIS — I700 Atherosclerosis of aorta: Secondary | ICD-10-CM | POA: Diagnosis not present

## 2023-03-17 DIAGNOSIS — D3132 Benign neoplasm of left choroid: Secondary | ICD-10-CM

## 2023-03-17 DIAGNOSIS — H353231 Exudative age-related macular degeneration, bilateral, with active choroidal neovascularization: Secondary | ICD-10-CM | POA: Diagnosis not present

## 2023-03-17 DIAGNOSIS — H43813 Vitreous degeneration, bilateral: Secondary | ICD-10-CM

## 2023-03-17 DIAGNOSIS — Z466 Encounter for fitting and adjustment of urinary device: Secondary | ICD-10-CM | POA: Diagnosis not present

## 2023-03-17 NOTE — Telephone Encounter (Signed)
Called and spoke with Olegario Messier and VO provided.

## 2023-03-17 NOTE — Telephone Encounter (Signed)
Home Health Verbal Orders  Agency:  [facility]   Caller: Olegario Messier, Speech pathologist  [phone]  Requesting OT/ PT/ Skilled nursing/ Social Work/ Speech:    Reason for Request:   Cognitive deficit   Frequency:  Effective 03/22/23: 2 x 2 weeks then 1 x 2 weeks  VM is secure.

## 2023-03-19 DIAGNOSIS — E785 Hyperlipidemia, unspecified: Secondary | ICD-10-CM | POA: Diagnosis not present

## 2023-03-19 DIAGNOSIS — I482 Chronic atrial fibrillation, unspecified: Secondary | ICD-10-CM | POA: Diagnosis not present

## 2023-03-19 DIAGNOSIS — H3530 Unspecified macular degeneration: Secondary | ICD-10-CM | POA: Diagnosis not present

## 2023-03-19 DIAGNOSIS — M1990 Unspecified osteoarthritis, unspecified site: Secondary | ICD-10-CM | POA: Diagnosis not present

## 2023-03-19 DIAGNOSIS — Z7901 Long term (current) use of anticoagulants: Secondary | ICD-10-CM | POA: Diagnosis not present

## 2023-03-19 DIAGNOSIS — G4733 Obstructive sleep apnea (adult) (pediatric): Secondary | ICD-10-CM | POA: Diagnosis not present

## 2023-03-19 DIAGNOSIS — K5790 Diverticulosis of intestine, part unspecified, without perforation or abscess without bleeding: Secondary | ICD-10-CM | POA: Diagnosis not present

## 2023-03-19 DIAGNOSIS — G629 Polyneuropathy, unspecified: Secondary | ICD-10-CM | POA: Diagnosis not present

## 2023-03-19 DIAGNOSIS — I10 Essential (primary) hypertension: Secondary | ICD-10-CM | POA: Diagnosis not present

## 2023-03-19 DIAGNOSIS — K219 Gastro-esophageal reflux disease without esophagitis: Secondary | ICD-10-CM | POA: Diagnosis not present

## 2023-03-19 DIAGNOSIS — Z87891 Personal history of nicotine dependence: Secondary | ICD-10-CM | POA: Diagnosis not present

## 2023-03-19 DIAGNOSIS — E559 Vitamin D deficiency, unspecified: Secondary | ICD-10-CM | POA: Diagnosis not present

## 2023-03-19 DIAGNOSIS — Z466 Encounter for fitting and adjustment of urinary device: Secondary | ICD-10-CM | POA: Diagnosis not present

## 2023-03-19 DIAGNOSIS — E538 Deficiency of other specified B group vitamins: Secondary | ICD-10-CM | POA: Diagnosis not present

## 2023-03-19 DIAGNOSIS — I2510 Atherosclerotic heart disease of native coronary artery without angina pectoris: Secondary | ICD-10-CM | POA: Diagnosis not present

## 2023-03-19 DIAGNOSIS — I700 Atherosclerosis of aorta: Secondary | ICD-10-CM | POA: Diagnosis not present

## 2023-03-19 DIAGNOSIS — N400 Enlarged prostate without lower urinary tract symptoms: Secondary | ICD-10-CM | POA: Diagnosis not present

## 2023-03-19 DIAGNOSIS — Z87442 Personal history of urinary calculi: Secondary | ICD-10-CM | POA: Diagnosis not present

## 2023-03-22 ENCOUNTER — Ambulatory Visit (INDEPENDENT_AMBULATORY_CARE_PROVIDER_SITE_OTHER): Payer: Medicare Other | Admitting: Family Medicine

## 2023-03-22 ENCOUNTER — Encounter: Payer: Self-pay | Admitting: Family Medicine

## 2023-03-22 VITALS — BP 131/67 | HR 52 | Temp 98.2°F | Resp 16 | Ht 72.0 in | Wt 164.2 lb

## 2023-03-22 DIAGNOSIS — I2510 Atherosclerotic heart disease of native coronary artery without angina pectoris: Secondary | ICD-10-CM | POA: Diagnosis not present

## 2023-03-22 DIAGNOSIS — R739 Hyperglycemia, unspecified: Secondary | ICD-10-CM | POA: Diagnosis not present

## 2023-03-22 DIAGNOSIS — E785 Hyperlipidemia, unspecified: Secondary | ICD-10-CM

## 2023-03-22 DIAGNOSIS — F0280 Dementia in other diseases classified elsewhere without behavioral disturbance: Secondary | ICD-10-CM | POA: Diagnosis not present

## 2023-03-22 DIAGNOSIS — N400 Enlarged prostate without lower urinary tract symptoms: Secondary | ICD-10-CM | POA: Diagnosis not present

## 2023-03-22 DIAGNOSIS — Z131 Encounter for screening for diabetes mellitus: Secondary | ICD-10-CM

## 2023-03-22 DIAGNOSIS — Z466 Encounter for fitting and adjustment of urinary device: Secondary | ICD-10-CM | POA: Diagnosis not present

## 2023-03-22 DIAGNOSIS — G3109 Other frontotemporal dementia: Secondary | ICD-10-CM

## 2023-03-22 DIAGNOSIS — I480 Paroxysmal atrial fibrillation: Secondary | ICD-10-CM

## 2023-03-22 DIAGNOSIS — I482 Chronic atrial fibrillation, unspecified: Secondary | ICD-10-CM | POA: Diagnosis not present

## 2023-03-22 DIAGNOSIS — G629 Polyneuropathy, unspecified: Secondary | ICD-10-CM | POA: Diagnosis not present

## 2023-03-22 DIAGNOSIS — I700 Atherosclerosis of aorta: Secondary | ICD-10-CM | POA: Diagnosis not present

## 2023-03-22 LAB — LIPID PANEL
Cholesterol: 86 mg/dL (ref 0–200)
HDL: 36.7 mg/dL — ABNORMAL LOW (ref >39.00)
LDL Cholesterol: 35 mg/dL (ref 0–99)
NonHDL: 49.74
Total CHOL/HDL Ratio: 2
Triglycerides: 76 mg/dL (ref 0.0–149.0)
VLDL: 15.2 mg/dL (ref 0.0–40.0)

## 2023-03-22 LAB — HEMOGLOBIN A1C: Hgb A1c MFr Bld: 6.1 % (ref 4.6–6.5)

## 2023-03-22 MED ORDER — BUSPIRONE HCL 5 MG PO TABS: 5.0000 mg | ORAL_TABLET | Freq: Every day | ORAL | 5 refills | Status: DC

## 2023-03-22 NOTE — Patient Instructions (Addendum)
Lets trial buspirone 5 mg daily (ok to do between noon and 3 pm) and update me in 3 weeks if not doing better- may bump dose up. We held off on traditional antianxiety treatments like Zoloft due to increased gastroenterology bleeding risk  Sign release of information at the check out desk for last podiatry note and blood flow test  Recommended follow up: Return in about 6 months (around 09/22/2023) for followup or sooner if needed.Schedule b4 you leave. -such as if not doing better with afternoon irritation

## 2023-03-22 NOTE — Progress Notes (Signed)
Phone [phone] In person visit   Subjective:   Joseph Simpson is a 88 y.o. year old very pleasant male patient who presents for/with See problem oriented charting Chief Complaint  Patient presents with   Hyperlipidemia   Hypertension   Anxiety    Increased anxiety in the afternoons, irritation and panic attacks every afternoon around 3 pm   Medical Management of Chronic Issues    Recently seen by podiatry, reduced blood flow to both feet    Past Medical History-  Patient Active Problem List   Diagnosis Date Noted   GI bleed 01/07/2022    Priority: High   New onset a-fib [facility])     Priority: High   CAD (coronary artery disease)     Priority: High   Macular degeneration 11/17/2011    Priority: High   Hyperglycemia 12/21/2019    Priority: Medium    B12 deficiency 03/09/2019    Priority: Medium    Gastroparesis 02/15/2017    Priority: Medium    Hx of adenomatous colonic polyps 02/15/2017    Priority: Medium    Hyperlipidemia 06/10/2016    Priority: Medium    BPH (benign prostatic hyperplasia) 11/23/2014    Priority: Medium    Palpitations 07/31/2014    Priority: Medium    Dyspnea 02/27/2014    Priority: Medium    Essential hypertension 09/23/2007    Priority: Medium    GERD 09/23/2007    Priority: Medium    Osteoarthritis of left hip 01/30/2019    Priority: Low   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 12/29/2017    Priority: Low   Left inguinal hernia 07/12/2016    Priority: Low   PAC (premature atrial contraction) 10/25/2015    Priority: Low   Squamous cell carcinoma in situ 09/09/2015    Priority: Low   Cough 05/10/2014    Priority: Low   Pulmonary nodule 05/10/2014    Priority: Low   Murmur 02/10/2012    Priority: Low   Left wrist pain 10/01/2021    Priority: 1.   Bilateral foot pain 07/04/2021    Priority: 1.   S/P hip replacement, left 02/11/2021    Priority: 1.   Delirium 11/06/2022   Altered mental status 11/05/2022    Abdominal pain, epigastric 11/02/2022   Intractable nausea and vomiting 10/30/2022   Nausea & vomiting 10/29/2022   Gastritis and duodenitis 10/29/2022    Medications- reviewed and updated Current Outpatient Medications  Medication Sig Dispense Refill   acetaminophen (TYLENOL) 500 MG tablet Take 500-1,000 mg by mouth 2 (two) times daily as needed for mild pain, moderate pain or headache.     apixaban (ELIQUIS) 5 MG TABS tablet TAKE 1 TABLET BY MOUTH TWICE A DAY 180 tablet 1   atorvastatin (LIPITOR) 20 MG tablet TAKE ONE TABLET BY MOUTH THREE TIMES A WEEK 36 tablet 4   BENADRYL ALLERGY 25 MG tablet Take 12.5 mg by mouth See admin instructions. Take 12.5 mg by mouth every 8 hours when taking Prochlorperazine     bevacizumab (AVASTIN) 2.5 mg/0.1 mL SOLN 2.5 mg by Intravitreal route See admin instructions. Injection to eyes every 4 weeks - use with vigamox     busPIRone (BUSPAR) 5 MG tablet Take 1 tablet (5 mg total) by mouth daily. 30 tablet 5   Cholecalciferol (VITAMIN D3) 1.25 MG (50000 UT) CAPS Take 1 capsule by mouth once a week.     ciprofloxacin (CIPRO) 500 MG tablet Take 500 mg by mouth 2 (  two) times daily.     cyanocobalamin (VITAMIN B12) 1000 MCG/ML injection Inject 1 mL (1,000 mcg total) into the muscle every 30 (thirty) days. By granddaughter who is an RN 3 mL 3   docusate sodium (COLACE) 100 MG capsule Take 200 mg by mouth at bedtime.     losartan (COZAAR) 25 MG tablet Take 0.5-1 tablets (12.5-25 mg total) by mouth daily. If blood pressure over 145 when you check- ok to take other half tablet once per day (so total of 25mg  per day) (Patient taking differently: Take 12.5 mg by mouth See admin instructions. Take 12.5 mg by mouth once a day and if Systolic B/P number is over 145 when checked, may take an additional 12.5 mg once a day (max total of 25 mg/day)) 135 tablet 3   metoprolol tartrate (LOPRESSOR) 25 MG tablet TAKE 0.5 TABLETS BY MOUTH 2 TIMES DAILY. 90 tablet 2   polyethylene  glycol (MIRALAX / GLYCOLAX) 17 g packet Take 17 g by mouth daily as needed for moderate constipation (mix and drink as directed).     prochlorperazine (COMPAZINE) 10 MG tablet Take 1 tablet (10 mg total) by mouth every 8 (eight) hours as needed for nausea or vomiting (take with 12.5mg  benadryl please). 10 tablet 0   SYSTANE BALANCE 0.6 % SOLN Apply to eye.     tamsulosin (FLOMAX) 0.4 MG CAPS capsule Take 0.4 mg by mouth daily.     THERATEARS NIGHTTIME 1 % GEL Apply to eye.     thiamine (VITAMIN B1) 100 MG tablet Take 1 tablet (100 mg total) by mouth daily.     VIGAMOX 0.5 % ophthalmic solution Place 1 drop into both eyes See admin instructions. Instill every 4 weeks as directed - use with avastin, instill 1 drop into both eyes 4 times daily for 2 days after eye injections     pantoprazole (PROTONIX) 40 MG tablet Take 1 tablet (40 mg total) by mouth 2 (two) times daily. 60 tablet 2   No current facility-administered medications for this visit.     Objective:  BP 131/67   Pulse (!) 52   Temp 98.2 F (36.8 C) (Temporal)   Resp 16   Ht 6' (1.829 m)   Wt 164 lb 3.2 oz (74.5 kg)   SpO2 96%   BMI 22.27 kg/m  Gen: NAD, resting comfortably CV: RRR no murmurs rubs or gallops Lungs: CTAB no crackles, wheeze, rhonchi Ext: no edema Skin: warm, dry Neuro: walking with walker, looks to son for answers    Assessment and Plan   #Frontotemporal dementia- new diagnosis based on PET 01/28/23 #  prior ? Wernicke's encephalopathy/memory loss S: 2 admissions in December 2023.  Started on thiamine 100 mg every other day.  He is followed by neurology.   -stopped driving now -Required rehab -Now at home with 24/7 family support -walks with walker - taking memantine 5 mg twice daily - [facility] 01[facility] -avoiding donepazil due to a fib history -pending info on pet scan A/P: progressive- working with neurology and upcomign visit- I think the frontotemporal dementia makes more sense  than the wernickes consideration- not as sure of benefit of memantine- no specific guidance for behavior on up to date - see deiscussoin below on anxiety  -son very supportive  -needs rolling walker now- getting less mobile so helpful to sit   # Anxiety S: Patient notes increased anxiety in the afternoons.  Seems more irritated around 3 PM. Repetitive  questions and irritation A/P: Lets trial buspirone 5 mg daily (ok to do between noon and 3 pm) and update me in 3 weeks if not doing better- may bump dose up. We held off on traditional antianxiety treatments like Zoloft due to increased gastroenterology bleeding risk    #Paroxysmal atrial fibrillation-follows with Dr. Sw[place] S: Anticoagulation: Eliquis 5 mg twice daily  -(Does have gastric ulceration history as well as partial removal of stomach (one third) and previously avoided aspirin with CAD) -Rate control: Metoprolol 12.5 mg twice daily A/P: appropriately anticoagulated and rate controlled- continue current medicine    #Hypertension  S: Controlled on losartan 12.5 mg daily (with extra dose 12.5 mg if blood pressure above 145), metoprolol 12.5 mg twice daily (also helps palpitations- reduced from full tablet due to bradycardia 06/2021) -Orthostatic issues on amlodipine 1.25 mg  BP Readings from Last 3 Encounters:  03/22/23 131/67  01/04/23 118/68  12/22/22 138/74  A/P: stable- continue current medicines      #Hyperlipidemia/CAD-follows with Dr. [place]and  S: Patient has had minimally obstructive CAD on cath 2013.  Medication: Eliquis 5 mg twice daily (cannot use aspirin with prior one third removal of stomach) -Atorvastatin 20 mg 3 times a week  -LDL goal at least under 100 but would prefer under 70 -no chest pain or shortness of breath  Lab Results  Component Value Date   CHOL 113 01/27/2022   HDL 37.30 (L) 01/27/2022   LDLCALC 49 01/27/2022   LDLDIRECT 83 06/19/2020   TRIG 132.0 01/27/2022   CHOLHDL 3 01/27/2022   A/P: coronary  artery disease asymptomatic - continue current medications Lipids hopefully stable- update lipid panel today. Continue current meds for now   - prefer LDL under 70 ideally- at goal last year  # Hyperglycemia/insulin resistance/prediabetes- peak a1c 6.1 S:  Medication: none Exercise and diet- exercise limited Lab Results  Component Value Date   HGBA1C 5.7 09/21/2022   HGBA1C 5.8 01/27/2022   HGBA1C 6.0 06/27/2021  A/P: hopefully stable- update a1c today. Continue without meds for now   # B12 deficiency S: Current treatment/medication (oral vs. IM): Monthly B12 injections Lab Results  Component Value Date   VITAMINB12 564 11/20/2022  A/P: has looked good- continue current medications monthly injectoins   # Reduced blood flow to feet-patient reports this was recently noted by podiatry-I do not have copy of note in system nor record of testing -occasional cramps with walking -we will get records and consider further workup depending on results   Recommended follow up: Return in about 6 months (around 09/22/2023) for followup or sooner if needed.Schedule b4 you leave. Future Appointments  Date Time Provider Department Center  03/29/2023 12:00 PM Erroll Luna, [place] [facility] None  04/28/2023  8:20 AM Sherrie George, MD [facility] None  06/22/2023  9:00 AM Marcos Eke, PA-C [facility] None   Lab/Order associations:   ICD-10-CM   1. Frontotemporal dementia (HCC)  G31.09 For home use only DME 4 wheeled rolling walker with seat ([id number])   F02.80     2. PAF (paroxysmal atrial fibrillation) (HCC)  I48.0     3. Hyperlipidemia, unspecified hyperlipidemia type  E78.5 Lipid panel    4. Hyperglycemia  R73.9 Hemoglobin A1c    5. Screening for diabetes mellitus  Z13.1 Hemoglobin A1c      Meds ordered this encounter  Medications   busPIRone (BUSPAR) 5 MG tablet    Sig: Take 1 tablet (5 mg total) by mouth daily.  Dispense:  30 tablet    Refill:  5    Return precautions advised.   Tana Conch, MD

## 2023-03-23 DIAGNOSIS — N400 Enlarged prostate without lower urinary tract symptoms: Secondary | ICD-10-CM | POA: Diagnosis not present

## 2023-03-23 DIAGNOSIS — G629 Polyneuropathy, unspecified: Secondary | ICD-10-CM | POA: Diagnosis not present

## 2023-03-23 DIAGNOSIS — I700 Atherosclerosis of aorta: Secondary | ICD-10-CM | POA: Diagnosis not present

## 2023-03-23 DIAGNOSIS — I482 Chronic atrial fibrillation, unspecified: Secondary | ICD-10-CM | POA: Diagnosis not present

## 2023-03-23 DIAGNOSIS — I2510 Atherosclerotic heart disease of native coronary artery without angina pectoris: Secondary | ICD-10-CM | POA: Diagnosis not present

## 2023-03-23 DIAGNOSIS — Z466 Encounter for fitting and adjustment of urinary device: Secondary | ICD-10-CM | POA: Diagnosis not present

## 2023-03-25 DIAGNOSIS — Z466 Encounter for fitting and adjustment of urinary device: Secondary | ICD-10-CM | POA: Diagnosis not present

## 2023-03-25 DIAGNOSIS — G629 Polyneuropathy, unspecified: Secondary | ICD-10-CM | POA: Diagnosis not present

## 2023-03-25 DIAGNOSIS — I700 Atherosclerosis of aorta: Secondary | ICD-10-CM | POA: Diagnosis not present

## 2023-03-25 DIAGNOSIS — N400 Enlarged prostate without lower urinary tract symptoms: Secondary | ICD-10-CM | POA: Diagnosis not present

## 2023-03-25 DIAGNOSIS — I482 Chronic atrial fibrillation, unspecified: Secondary | ICD-10-CM | POA: Diagnosis not present

## 2023-03-25 DIAGNOSIS — I2510 Atherosclerotic heart disease of native coronary artery without angina pectoris: Secondary | ICD-10-CM | POA: Diagnosis not present

## 2023-03-26 ENCOUNTER — Telehealth: Payer: Self-pay | Admitting: Pharmacist

## 2023-03-26 NOTE — Progress Notes (Signed)
Care Management & Coordination Services Pharmacy Team  Reason for Encounter: Appointment Reminder  Contacted patient to confirm telephone appointment with Erskine Emery, PharmD on 03/29/2023 at 12 pm. Spoke with family on 03/26/2023    Star Rating Drugs:  Atorvastatin 20 mg last filled 01/28/2023 90 DS Losartan 25 mg last filled 03/09/2023 90 DS   Care Gaps: Annual wellness visit in last year? Yes   Future Appointments  Date Time Provider Department Center  03/29/2023 12:00 PM Erroll Luna, [place] [facility] None  04/28/2023  8:20 AM Sherrie George, MD [facility] None  06/22/2023  9:00 AM Marcos Eke, PA-C [facility] None  09/24/2023  9:20 AM Shelva Majestic, MD [facility]   April D Calhoun, [facility] Clinical Pharmacist Assistant [phone]

## 2023-03-29 ENCOUNTER — Ambulatory Visit: Payer: Medicare Other | Admitting: Pharmacist

## 2023-03-29 DIAGNOSIS — Z466 Encounter for fitting and adjustment of urinary device: Secondary | ICD-10-CM | POA: Diagnosis not present

## 2023-03-29 DIAGNOSIS — I2510 Atherosclerotic heart disease of native coronary artery without angina pectoris: Secondary | ICD-10-CM | POA: Diagnosis not present

## 2023-03-29 DIAGNOSIS — I700 Atherosclerosis of aorta: Secondary | ICD-10-CM | POA: Diagnosis not present

## 2023-03-29 DIAGNOSIS — N400 Enlarged prostate without lower urinary tract symptoms: Secondary | ICD-10-CM | POA: Diagnosis not present

## 2023-03-29 DIAGNOSIS — I482 Chronic atrial fibrillation, unspecified: Secondary | ICD-10-CM | POA: Diagnosis not present

## 2023-03-29 DIAGNOSIS — G629 Polyneuropathy, unspecified: Secondary | ICD-10-CM | POA: Diagnosis not present

## 2023-03-29 NOTE — Progress Notes (Signed)
Care Management & Coordination Services Pharmacy Note  03/29/2023 Name:  Joseph Simpson MRN:  [id number] DOB:  05-Feb-1935  Summary: PharmD FU visit.  Buspirone has not seemed to help much per son but only been on it 1 week.  They give at about 2-3 pm due to episodes usually taking place around 5pm.    Recommendations/Changes made from today's visit: No changes, give med a few more weeks to decide  Follow up plan: FU with PCP in 2-3 weeks to report medication efficacy   Subjective: Joseph Simpson is an 88 y.o. year old male who is a primary patient of Durene Cal, Aldine Contes, MD.  The care coordination team was consulted for assistance with disease management and care coordination needs.    Engaged with patient by telephone for follow up visit.  Recent office visits:  01/04/2023 OV (PCP) Joseph Majestic, MD; no medication changes indicated.   Recent consult visits:  12/22/2022 [facility] (Neurology) Marcos Eke, PA-C; no medication changes indicated.   12/08/2022 OV (Gastro) Lemmon, [place], [place]; no medication changes indicated.   Hospital visits:  12/17/2022 ED visit for Foley catheter problem -No medication changes indicated.   Objective:  Lab Results  Component Value Date   CREATININE 0.74 01/04/2023   BUN 8 01/04/2023   GFR 82.26 01/04/2023   GFRNONAA >60 12/17/2022   GFRAA >60 12/15/2017   NA 140 01/04/2023   K 3.8 01/04/2023   CALCIUM 9.5 01/04/2023   CO2 32 01/04/2023   GLUCOSE 96 01/04/2023    Lab Results  Component Value Date/Time   HGBA1C 6.1 03/22/2023 10:27 AM   HGBA1C 5.7 09/21/2022 09:57 AM   GFR 82.26 01/04/2023 02:29 PM   GFR 82.10 09/21/2022 09:57 AM    Last diabetic Eye exam:  Lab Results  Component Value Date/Time   HMDIABEYEEXA No Retinopathy 01/16/2020 12:00 AM    Last diabetic Foot exam: No results found for: "HMDIABFOOTEX"   Lab Results  Component Value Date   CHOL 86 03/22/2023   HDL 36.70 (L) 03/22/2023   LDLCALC 35 03/22/2023    LDLDIRECT 83 06/19/2020   TRIG 76.0 03/22/2023   CHOLHDL 2 03/22/2023       Latest Ref Rng & Units 01/04/2023    2:29 PM 11/05/2022   12:37 PM 10/30/2022    5:47 AM  Hepatic Function  Total Protein 6.0 - 8.3 g/dL 6.9  6.1  7.9   Albumin 3.5 - 5.2 g/dL 4.0  3.2  4.5   AST 0 - 37 U/L 15  20  19    ALT 0 - 53 U/L 11  23  15    Alk Phosphatase 39 - 117 U/L 87  49  57   Total Bilirubin 0.2 - 1.2 mg/dL 1.0  1.3  1.5     Lab Results  Component Value Date/Time   TSH 1.811 11/06/2022 09:12 AM   TSH 2.90 09/21/2022 09:57 AM   TSH 1.731 01/05/2022 03:53 AM   TSH 4.27 06/27/2021 10:35 AM       Latest Ref Rng & Units 01/04/2023    2:29 PM 12/17/2022   10:05 PM 11/11/2022    9:03 AM  CBC  WBC 4.0 - 10.5 K/uL 6.1  13.5  5.6   Hemoglobin 13.0 - 17.0 g/dL 40.9  81.1  91.4   Hematocrit 39.0 - 52.0 % 43.5  42.4  40.9   Platelets 150.0 - 400.0 K/uL 198.0  261  123     Lab Results  Component Value Date/Time   VD25OH 34.92 08/20/2021 10:42 AM   VITAMINB12 564 11/20/2022 09:44 AM   VITAMINB12 1,040 (H) 01/07/2022 04:07 AM    Clinical ASCVD: No The ASCVD Risk score (Arnett DK, et al., 2019) failed to calculate for the following reasons:   The 2019 ASCVD risk score is only valid for ages 98 to 80        09/21/2022    9:05 AM 09/17/2022    8:52 AM 09/04/2021    8:52 AM  Depression screen PHQ 2/9  Decreased Interest 0 0 0  Down, Depressed, Hopeless 0 0 0  PHQ - 2 Score 0 0 0     Social History   Tobacco Use  Smoking Status Former   Packs/day: 0.10   Years: 4.00   Additional pack years: 0.00   Total pack years: 0.40   Types: Cigarettes   Quit date: 09/03/1956   Years since quitting: 66.6  Smokeless Tobacco Never   BP Readings from Last 3 Encounters:  03/22/23 131/67  01/04/23 118/68  12/22/22 138/74   Pulse Readings from Last 3 Encounters:  03/22/23 (!) 52  01/04/23 70  12/22/22 100   Wt Readings from Last 3 Encounters:  03/22/23 164 lb 3.2 oz (74.5 kg)  01/04/23  166 lb 3.2 oz (75.4 kg)  12/22/22 164 lb (74.4 kg)   BMI Readings from Last 3 Encounters:  03/22/23 22.27 kg/Simpson  01/04/23 21.93 kg/Simpson  12/22/22 21.64 kg/Simpson    Allergies  Allergen Reactions   Tape Other (See Comments)    SKIN IS FRAGILE AND WILL TEAR EASILY- PAPER TAPE ONLY   Aspirin Other (See Comments)    "UPSETS THE STOMACH"   Percocet [Oxycodone-Acetaminophen] Nausea And Vomiting   Vitamins [Apatate] Other (See Comments)    "Bothers the stomach"    Medications Reviewed Today     Reviewed by Erroll Luna, [facility] (Pharmacist) on 03/29/23 at 1543  Med List Status: <None>   Medication Order Taking? Sig Documenting Provider Last Dose Status Informant  acetaminophen (TYLENOL) 500 MG tablet [id number] No Take 500-1,000 mg by mouth 2 (two) times daily as needed for mild pain, moderate pain or headache. [provider] Taking Active Family Member  apixaban (ELIQUIS) 5 MG TABS tablet [id number] No TAKE 1 TABLET BY MOUTH TWICE A DAY Sw[place], Peter M, MD Taking Active   atorvastatin (LIPITOR) 20 MG tablet [id number] No TAKE ONE TABLET BY MOUTH THREE TIMES A WEEK Joseph Majestic, MD Taking Active   BENADRYL ALLERGY 25 MG tablet [id number] No Take 12.5 mg by mouth See admin instructions. Take 12.5 mg by mouth every 8 hours when taking Prochlorperazine [provider] Taking Active Family Member  bevacizumab (AVASTIN) 2.5 mg/0.1 mL SOLN [id number] No 2.5 mg by Intravitreal route See admin instructions. Injection to eyes every 4 weeks - use with vigamox [provider] Taking Active Family Member           Med Note Cindra Presume, Devoria Glassing   Thu Apr 04, 2020  9:29 AM)    busPIRone (BUSPAR) 5 MG tablet [id number]  Take 1 tablet (5 mg total) by mouth daily. Joseph Majestic, MD  Active   Cholecalciferol (VITAMIN D3) 1.25 MG (50000 UT) CAPS [id number] No Take 1 capsule by mouth once a week. [provider] Taking Active   ciprofloxacin (CIPRO) 500 MG tablet [id number] No  Take 500 mg by mouth 2 (two) times daily. [provider] Taking Active   cyanocobalamin (VITAMIN B12)  1000 MCG/ML injection [id number] No Inject 1 mL (1,000 mcg total) into the muscle every 30 (thirty) days. By granddaughter who is an RN Hunter, Aldine Contes, MD Taking Active   docusate sodium (COLACE) 100 MG capsule [id number] No Take 200 mg by mouth at bedtime. [provider] Taking Active Family Member  losartan (COZAAR) 25 MG tablet [id number] No Take 0.5-1 tablets (12.5-25 mg total) by mouth daily. If blood pressure over 145 when you check- ok to take other half tablet once per day (so total of 25mg  per day)  Patient taking differently: Take 12.5 mg by mouth See admin instructions. Take 12.5 mg by mouth once a day and if Systolic B/P number is over 145 when checked, may take an additional 12.5 mg once a day (max total of 25 mg/day)   Joseph Majestic, MD Taking Active Family Member  metoprolol tartrate (LOPRESSOR) 25 MG tablet [id number] No TAKE 0.5 TABLETS BY MOUTH 2 TIMES DAILY. [place], Peter M, MD Taking Active   pantoprazole (PROTONIX) 40 MG tablet [id number] No Take 1 tablet (40 mg total) by mouth 2 (two) times daily. Lorin Glass, MD Taking Expired 02/01/23 2359 Family Member  polyethylene glycol (MIRALAX / GLYCOLAX) 17 g packet [id number] No Take 17 g by mouth daily as needed for moderate constipation (mix and drink as directed). [provider] Taking Active Family Member  prochlorperazine (COMPAZINE) 10 MG tablet [id number] No Take 1 tablet (10 mg total) by mouth every 8 (eight) hours as needed for nausea or vomiting (take with 12.5mg  benadryl please). Small, Brooke L, PA Taking Active Family Member  SYSTANE BALANCE 0.6 % SOLN [id number] No Apply to eye. [provider] Taking Active   tamsulosin (FLOMAX) 0.4 MG CAPS capsule [id number] No Take 0.4 mg by mouth daily. [provider] Taking Active Family Member  THERATEARS NIGHTTIME 1 % GEL [id number] No  Apply to eye. [provider] Taking Active   thiamine (VITAMIN B1) 100 MG tablet [id number] No Take 1 tablet (100 mg total) by mouth daily. Dimple Nanas, MD Taking Active   VIGAMOX 0.5 % ophthalmic solution [id number] No Place 1 drop into both eyes See admin instructions. Instill every 4 weeks as directed - use with avastin, instill 1 drop into both eyes 4 times daily for 2 days after eye injections [provider] Taking Active Family Member           Med Note Floyce Stakes, [place] A   Wed Jul 01, 2016 10:46 AM)              SDOH:  (Social Determinants of Health) assessments and interventions performed: No, done within last year Financial Resource Strain: Low Risk  (09/17/2022)   Overall Financial Resource Strain (CARDIA)    Difficulty of Paying Living Expenses: Not hard at all   Food Insecurity: No Food Insecurity (11/05/2022)   Hunger Vital Sign    Worried About Running Out of Food in the Last Year: Never true    Ran Out of Food in the Last Year: Never true    SDOH Interventions    Flowsheet Row Telephone from 11/04/2022 in [facility] Care Coordination Clinical Support from 09/17/2022 in [facility]  SDOH Interventions    Food Insecurity Interventions Intervention Not Indicated Intervention Not Indicated  Housing Interventions -- Intervention Not Indicated  Transportation Interventions Intervention Not Indicated Intervention Not Indicated  Financial Strain Interventions -- Intervention Not Indicated  Physical Activity Interventions -- Intervention Not Indicated  Stress Interventions -- Intervention Not Indicated  Social Connections Interventions -- Intervention Not Indicated       Medication Assistance: None required.  Patient affirms current coverage meets needs.  Medication Access: Within the past 30 days, how often has patient missed a dose of medication? 0 Is a pillbox or other method used to improve  adherence? Yes  Factors that may affect medication adherence? no barriers identified Are meds synced by current pharmacy? No  Are meds delivered by current pharmacy? No  Does patient experience delays in picking up medications due to transportation concerns? Yes   [facility] Services Reviewed: Is patient disadvantaged to use [facility]?: Yes  Current Rx insurance plan: Medicare Name and location of Current pharmacy:  [facility] - [place], [place] - [address] [place] [address] Phone: [phone] Fax: [phone]  [id number] [place], [place] - [address] [place] [address] Phone: [phone] Fax: [phone]  [facility] services reviewed with patient today?: Yes  Patient requests to transfer care to [facility] Pharmacy?: No  Reason patient declined to change pharmacies: Loyalty to other pharmacy/Patient preference  Compliance/Adherence/Medication fill history: Star Rating Drugs:  Atorvastatin 20 mg last filled 01/28/2023 90 DS Losartan 25 mg last filled 03/09/2023 90 DS     Care Gaps: Annual wellness visit in last year? Yes   Assessment/Plan   Hyperlipidemia: (LDL goal < 70) -Controlled -Current treatment: Atorvastatin 20mg  Appropriate, Effective, Safe, Accessible -Medications previously tried: none noted  -LDL well controlled -Educated on Cholesterol goals;  Benefits of statin for ASCVD risk reduction; Importance of limiting foods high in cholesterol; -Recommended to continue current medication -Continue routine lipid screenings.  Anxiety (Goal: Reduce symptoms, Improve QOL) -Not ideally controlled -Current treatment  Buspirone 5mg  Appropriate, Query effective, ,  -Medications previously tried: none noted -Recently started buspirone about 1 week ago.  Son reports not much difference at this time.  However, he should give it a  few more weeks to see if it will work.  -Recommended to continue current medication -Son is going to FU with PCP in two weeks to discuss medication and possible dose titration.   Willa Frater, PharmD Clinical Pharmacist  [facility] [phone]

## 2023-03-30 DIAGNOSIS — I2510 Atherosclerotic heart disease of native coronary artery without angina pectoris: Secondary | ICD-10-CM | POA: Diagnosis not present

## 2023-03-30 DIAGNOSIS — Z466 Encounter for fitting and adjustment of urinary device: Secondary | ICD-10-CM | POA: Diagnosis not present

## 2023-03-30 DIAGNOSIS — I700 Atherosclerosis of aorta: Secondary | ICD-10-CM | POA: Diagnosis not present

## 2023-03-30 DIAGNOSIS — N400 Enlarged prostate without lower urinary tract symptoms: Secondary | ICD-10-CM | POA: Diagnosis not present

## 2023-03-30 DIAGNOSIS — G629 Polyneuropathy, unspecified: Secondary | ICD-10-CM | POA: Diagnosis not present

## 2023-03-30 DIAGNOSIS — I482 Chronic atrial fibrillation, unspecified: Secondary | ICD-10-CM | POA: Diagnosis not present

## 2023-03-31 DIAGNOSIS — N401 Enlarged prostate with lower urinary tract symptoms: Secondary | ICD-10-CM | POA: Diagnosis not present

## 2023-03-31 DIAGNOSIS — R3914 Feeling of incomplete bladder emptying: Secondary | ICD-10-CM | POA: Diagnosis not present

## 2023-04-02 DIAGNOSIS — R419 Unspecified symptoms and signs involving cognitive functions and awareness: Secondary | ICD-10-CM | POA: Diagnosis not present

## 2023-04-05 DIAGNOSIS — Z466 Encounter for fitting and adjustment of urinary device: Secondary | ICD-10-CM | POA: Diagnosis not present

## 2023-04-05 DIAGNOSIS — I700 Atherosclerosis of aorta: Secondary | ICD-10-CM | POA: Diagnosis not present

## 2023-04-05 DIAGNOSIS — N400 Enlarged prostate without lower urinary tract symptoms: Secondary | ICD-10-CM | POA: Diagnosis not present

## 2023-04-05 DIAGNOSIS — I482 Chronic atrial fibrillation, unspecified: Secondary | ICD-10-CM | POA: Diagnosis not present

## 2023-04-05 DIAGNOSIS — I2510 Atherosclerotic heart disease of native coronary artery without angina pectoris: Secondary | ICD-10-CM | POA: Diagnosis not present

## 2023-04-05 DIAGNOSIS — G629 Polyneuropathy, unspecified: Secondary | ICD-10-CM | POA: Diagnosis not present

## 2023-04-06 DIAGNOSIS — Z466 Encounter for fitting and adjustment of urinary device: Secondary | ICD-10-CM | POA: Diagnosis not present

## 2023-04-06 DIAGNOSIS — I700 Atherosclerosis of aorta: Secondary | ICD-10-CM | POA: Diagnosis not present

## 2023-04-06 DIAGNOSIS — G629 Polyneuropathy, unspecified: Secondary | ICD-10-CM | POA: Diagnosis not present

## 2023-04-06 DIAGNOSIS — I2510 Atherosclerotic heart disease of native coronary artery without angina pectoris: Secondary | ICD-10-CM | POA: Diagnosis not present

## 2023-04-06 DIAGNOSIS — N400 Enlarged prostate without lower urinary tract symptoms: Secondary | ICD-10-CM | POA: Diagnosis not present

## 2023-04-06 DIAGNOSIS — I482 Chronic atrial fibrillation, unspecified: Secondary | ICD-10-CM | POA: Diagnosis not present

## 2023-04-13 ENCOUNTER — Encounter: Payer: Self-pay | Admitting: Family Medicine

## 2023-04-13 DIAGNOSIS — Z466 Encounter for fitting and adjustment of urinary device: Secondary | ICD-10-CM | POA: Diagnosis not present

## 2023-04-13 DIAGNOSIS — G629 Polyneuropathy, unspecified: Secondary | ICD-10-CM | POA: Diagnosis not present

## 2023-04-13 DIAGNOSIS — N400 Enlarged prostate without lower urinary tract symptoms: Secondary | ICD-10-CM | POA: Diagnosis not present

## 2023-04-13 DIAGNOSIS — I700 Atherosclerosis of aorta: Secondary | ICD-10-CM | POA: Diagnosis not present

## 2023-04-13 DIAGNOSIS — I2510 Atherosclerotic heart disease of native coronary artery without angina pectoris: Secondary | ICD-10-CM | POA: Diagnosis not present

## 2023-04-13 DIAGNOSIS — I482 Chronic atrial fibrillation, unspecified: Secondary | ICD-10-CM | POA: Diagnosis not present

## 2023-04-13 MED ORDER — BUSPIRONE HCL 5 MG PO TABS: 5.0000 mg | ORAL_TABLET | Freq: Two times a day (BID) | ORAL | 5 refills | Status: DC

## 2023-04-14 DIAGNOSIS — N400 Enlarged prostate without lower urinary tract symptoms: Secondary | ICD-10-CM | POA: Diagnosis not present

## 2023-04-14 DIAGNOSIS — I700 Atherosclerosis of aorta: Secondary | ICD-10-CM | POA: Diagnosis not present

## 2023-04-14 DIAGNOSIS — G629 Polyneuropathy, unspecified: Secondary | ICD-10-CM | POA: Diagnosis not present

## 2023-04-14 DIAGNOSIS — I482 Chronic atrial fibrillation, unspecified: Secondary | ICD-10-CM | POA: Diagnosis not present

## 2023-04-14 DIAGNOSIS — Z466 Encounter for fitting and adjustment of urinary device: Secondary | ICD-10-CM | POA: Diagnosis not present

## 2023-04-14 DIAGNOSIS — I2510 Atherosclerotic heart disease of native coronary artery without angina pectoris: Secondary | ICD-10-CM | POA: Diagnosis not present

## 2023-04-19 ENCOUNTER — Telehealth: Payer: Self-pay | Admitting: Family Medicine

## 2023-04-19 NOTE — Telephone Encounter (Signed)
Patient dropped off document Home Health Certificate (Order ID [id number]), to be filled out by provider. Patient requested to send it via Fax [phone] 5-days. Document is located in providers tray at front office.Please advise

## 2023-04-20 NOTE — Telephone Encounter (Signed)
Forms have been faxed back.  

## 2023-04-22 DIAGNOSIS — D485 Neoplasm of uncertain behavior of skin: Secondary | ICD-10-CM | POA: Diagnosis not present

## 2023-04-22 DIAGNOSIS — D2271 Melanocytic nevi of right lower limb, including hip: Secondary | ICD-10-CM | POA: Diagnosis not present

## 2023-04-22 DIAGNOSIS — L570 Actinic keratosis: Secondary | ICD-10-CM | POA: Diagnosis not present

## 2023-04-22 DIAGNOSIS — L578 Other skin changes due to chronic exposure to nonionizing radiation: Secondary | ICD-10-CM | POA: Diagnosis not present

## 2023-04-22 DIAGNOSIS — D2230 Melanocytic nevi of unspecified part of face: Secondary | ICD-10-CM | POA: Diagnosis not present

## 2023-04-22 DIAGNOSIS — Z85828 Personal history of other malignant neoplasm of skin: Secondary | ICD-10-CM | POA: Diagnosis not present

## 2023-04-22 DIAGNOSIS — D0462 Carcinoma in situ of skin of left upper limb, including shoulder: Secondary | ICD-10-CM | POA: Diagnosis not present

## 2023-04-22 DIAGNOSIS — L821 Other seborrheic keratosis: Secondary | ICD-10-CM | POA: Diagnosis not present

## 2023-04-22 DIAGNOSIS — L814 Other melanin hyperpigmentation: Secondary | ICD-10-CM | POA: Diagnosis not present

## 2023-04-22 DIAGNOSIS — D225 Melanocytic nevi of trunk: Secondary | ICD-10-CM | POA: Diagnosis not present

## 2023-04-22 DIAGNOSIS — Z87898 Personal history of other specified conditions: Secondary | ICD-10-CM | POA: Diagnosis not present

## 2023-04-26 ENCOUNTER — Telehealth: Payer: Self-pay | Admitting: Family Medicine

## 2023-04-26 NOTE — Telephone Encounter (Signed)
Called and spoke with Pt daughter and she states pt no longer has home health and services has stopped due to him not being homebound and they are needing a doctors orders for supplies, are you ok signing the prescription/order for supplies? Pt daughter will cb with name of company and fax number.

## 2023-04-26 NOTE — Telephone Encounter (Signed)
Patient's daughter-in-law Liborio Nixon requests to be called re: Orders for Catheter supplies/home health no longer involved.

## 2023-04-26 NOTE — Telephone Encounter (Signed)
Honestly it would likely be best filled out by urology who knows him well and his urological needs-I am not unwilling but urology is the most common sense placed to get this completed

## 2023-04-27 NOTE — Telephone Encounter (Signed)
Called and spoke with pt daughter, she states she contacted the DME company already and they are sending [place] the form. States pt urologist is retiring soon however, when we receive the form she stated we can fax it over to [facility] and have them fill it out.

## 2023-04-28 ENCOUNTER — Encounter (INDEPENDENT_AMBULATORY_CARE_PROVIDER_SITE_OTHER): Payer: Medicare Other | Admitting: Ophthalmology

## 2023-04-28 DIAGNOSIS — H33301 Unspecified retinal break, right eye: Secondary | ICD-10-CM | POA: Diagnosis not present

## 2023-04-28 DIAGNOSIS — H353231 Exudative age-related macular degeneration, bilateral, with active choroidal neovascularization: Secondary | ICD-10-CM

## 2023-04-28 DIAGNOSIS — D3132 Benign neoplasm of left choroid: Secondary | ICD-10-CM

## 2023-04-28 DIAGNOSIS — I10 Essential (primary) hypertension: Secondary | ICD-10-CM | POA: Diagnosis not present

## 2023-04-28 DIAGNOSIS — H35033 Hypertensive retinopathy, bilateral: Secondary | ICD-10-CM | POA: Diagnosis not present

## 2023-04-28 DIAGNOSIS — H43813 Vitreous degeneration, bilateral: Secondary | ICD-10-CM

## 2023-05-14 ENCOUNTER — Encounter (HOSPITAL_COMMUNITY): Payer: Self-pay | Admitting: Emergency Medicine

## 2023-05-14 ENCOUNTER — Emergency Department (HOSPITAL_COMMUNITY)
Admission: EM | Admit: 2023-05-14 | Discharge: 2023-05-14 | Disposition: A | Discharge status: Home / Self Care | Payer: Medicare Other | Attending: Emergency Medicine | Admitting: Emergency Medicine

## 2023-05-14 ENCOUNTER — Emergency Department (HOSPITAL_COMMUNITY): Payer: Medicare Other

## 2023-05-14 DIAGNOSIS — I480 Paroxysmal atrial fibrillation: Secondary | ICD-10-CM

## 2023-05-14 DIAGNOSIS — R0602 Shortness of breath: Secondary | ICD-10-CM | POA: Insufficient documentation

## 2023-05-14 DIAGNOSIS — I4891 Unspecified atrial fibrillation: Secondary | ICD-10-CM | POA: Insufficient documentation

## 2023-05-14 DIAGNOSIS — I2510 Atherosclerotic heart disease of native coronary artery without angina pectoris: Secondary | ICD-10-CM | POA: Insufficient documentation

## 2023-05-14 DIAGNOSIS — R410 Disorientation, unspecified: Secondary | ICD-10-CM | POA: Diagnosis not present

## 2023-05-14 DIAGNOSIS — R0981 Nasal congestion: Secondary | ICD-10-CM | POA: Insufficient documentation

## 2023-05-14 DIAGNOSIS — R0600 Dyspnea, unspecified: Secondary | ICD-10-CM | POA: Diagnosis not present

## 2023-05-14 DIAGNOSIS — I499 Cardiac arrhythmia, unspecified: Secondary | ICD-10-CM | POA: Diagnosis not present

## 2023-05-14 DIAGNOSIS — R059 Cough, unspecified: Secondary | ICD-10-CM | POA: Insufficient documentation

## 2023-05-14 DIAGNOSIS — Z7901 Long term (current) use of anticoagulants: Secondary | ICD-10-CM | POA: Diagnosis not present

## 2023-05-14 DIAGNOSIS — I10 Essential (primary) hypertension: Secondary | ICD-10-CM | POA: Diagnosis not present

## 2023-05-14 DIAGNOSIS — R058 Other specified cough: Secondary | ICD-10-CM | POA: Diagnosis not present

## 2023-05-14 DIAGNOSIS — R064 Hyperventilation: Secondary | ICD-10-CM | POA: Diagnosis not present

## 2023-05-14 DIAGNOSIS — Z96611 Presence of right artificial shoulder joint: Secondary | ICD-10-CM | POA: Diagnosis not present

## 2023-05-14 LAB — CBC WITH DIFFERENTIAL/PLATELET
Abs Immature Granulocytes: 0.01 10*3/uL (ref 0.00–0.07)
Basophils Absolute: 0 10*3/uL (ref 0.0–0.1)
Basophils Relative: 0 %
Eosinophils Absolute: 0.1 10*3/uL (ref 0.0–0.5)
Eosinophils Relative: 1 %
HCT: 47.2 % (ref 39.0–52.0)
Hemoglobin: 15.4 g/dL (ref 13.0–17.0)
Immature Granulocytes: 0 %
Lymphocytes Relative: 8 %
Lymphs Abs: 0.7 10*3/uL (ref 0.7–4.0)
MCH: 29.1 pg (ref 26.0–34.0)
MCHC: 32.6 g/dL (ref 30.0–36.0)
MCV: 89.1 fL (ref 80.0–100.0)
Monocytes Absolute: 0.5 10*3/uL (ref 0.1–1.0)
Monocytes Relative: 6 %
Neutro Abs: 6.8 10*3/uL (ref 1.7–7.7)
Neutrophils Relative %: 85 %
Platelets: 136 10*3/uL — ABNORMAL LOW (ref 150–400)
RBC: 5.3 MIL/uL (ref 4.22–5.81)
RDW: 14.3 % (ref 11.5–15.5)
WBC: 8 10*3/uL (ref 4.0–10.5)
nRBC: 0 % (ref 0.0–0.2)

## 2023-05-14 LAB — URINALYSIS, ROUTINE W REFLEX MICROSCOPIC
Bilirubin Urine: NEGATIVE
Glucose, UA: NEGATIVE mg/dL
Ketones, ur: NEGATIVE mg/dL
Nitrite: POSITIVE — AB
Protein, ur: 100 mg/dL — AB
Specific Gravity, Urine: 1.016 (ref 1.005–1.030)
WBC, UA: >50 WBC/hpf (ref 0–5)
pH: 6 (ref 5.0–8.0)

## 2023-05-14 LAB — BASIC METABOLIC PANEL
Anion gap: 8 (ref 5–15)
BUN: 14 mg/dL (ref 8–23)
CO2: 25 mmol/L (ref 22–32)
Calcium: 8.5 mg/dL — ABNORMAL LOW (ref 8.9–10.3)
Chloride: 107 mmol/L (ref 98–111)
Creatinine, Ser: 0.67 mg/dL (ref 0.61–1.24)
GFR, Estimated: >60 mL/min (ref >60)
Glucose, Bld: 134 mg/dL — ABNORMAL HIGH (ref 70–99)
Potassium: 3.6 mmol/L (ref 3.5–5.1)
Sodium: 140 mmol/L (ref 135–145)

## 2023-05-14 LAB — MAGNESIUM: Magnesium: 2.4 mg/dL (ref 1.7–2.4)

## 2023-05-14 LAB — BRAIN NATRIURETIC PEPTIDE: B Natriuretic Peptide: 43.9 pg/mL (ref 0.0–100.0)

## 2023-05-14 MED ORDER — METOPROLOL TARTRATE 25 MG PO TABS
12.5000 mg | ORAL_TABLET | Freq: Once | ORAL | Status: AC
  Administered 2023-05-14: 12.5 mg via ORAL
  Filled 2023-05-14: qty 1

## 2023-05-14 NOTE — ED Triage Notes (Addendum)
Pt from home- EMS called by family bc he woke up in middle of the night c/o not being able to breathe. Thick green mucous coughed up and dissipated during transport. EMS reports he coughed a lot but was still able to speak and was not tripoding or showing obvious signs of SOB.  Family states urine in foley bag more odorous over past 24 hours. Pt is alert to his baseline but has dementia. Alert to self and situation.  Per family- he was living alone and driving a 6-8 months ago but now lives with son and dtr in [place].

## 2023-05-14 NOTE — ED Notes (Signed)
Pt appears to be in no respiratory distress. No work to breathe or tachypnea

## 2023-05-14 NOTE — ED Provider Notes (Signed)
[place] EMERGENCY DEPARTMENT AT [facility] Provider Note   CSN: [id number] Arrival date & time: 05/14/23  0272     History  Chief Complaint  Patient presents with   Shortness of Breath    Joseph Simpson is a 88 y.o. male.  HPI    88 year old male comes in with chief complaint of shortness of breath.  Patient accompanied by his son, who provides substantial portion of the history.  Patient has history of CAD, A-fib for which she is on Eliquis and as needed metoprolol.  According to son, patient woke him up around 4 AM with shortness of breath.  Patient went to bed feeling fine.  They checked patient's pulse, and his heart rate was fast, irregular.  They called EMS after waiting for about 30 minutes and patient not improving.  When the son arrived to the emergency room however, patient was reading normally.  According to the patient, he has had some congestion, cough for the last few days.  However his symptoms are at baseline normal for him.  He had a normal day yesterday.  He denied any chest pain.  Home Medications Prior to Admission medications   Medication Sig Start Date End Date Taking? Authorizing Provider  acetaminophen (TYLENOL) 500 MG tablet Take 500-1,000 mg by mouth 2 (two) times daily as needed for mild pain, moderate pain or headache.    [provider]  apixaban (ELIQUIS) 5 MG TABS tablet TAKE 1 TABLET BY MOUTH TWICE A DAY 03/15/23   [place], Peter M, MD  atorvastatin (LIPITOR) 20 MG tablet TAKE ONE TABLET BY MOUTH THREE TIMES A WEEK 11/12/22   Shelva Majestic, MD  BENADRYL ALLERGY 25 MG tablet Take 12.5 mg by mouth See admin instructions. Take 12.5 mg by mouth every 8 hours when taking Prochlorperazine    [provider]  bevacizumab (AVASTIN) 2.5 mg/0.1 mL SOLN 2.5 mg by Intravitreal route See admin instructions. Injection to eyes every 4 weeks - use with vigamox    [provider]  busPIRone (BUSPAR) 5 MG tablet Take 1 tablet  (5 mg total) by mouth 2 (two) times daily. 04/13/23   Shelva Majestic, MD  Cholecalciferol (VITAMIN D3) 1.25 MG (50000 UT) CAPS Take 1 capsule by mouth once a week. 11/22/22   [provider]  ciprofloxacin (CIPRO) 500 MG tablet Take 500 mg by mouth 2 (two) times daily. 12/30/22   [provider]  cyanocobalamin (VITAMIN B12) 1000 MCG/ML injection Inject 1 mL (1,000 mcg total) into the muscle every 30 (thirty) days. By granddaughter who is an RN 01/04/23   Shelva Majestic, MD  docusate sodium (COLACE) 100 MG capsule Take 200 mg by mouth at bedtime.    [provider]  losartan (COZAAR) 25 MG tablet Take 0.5-1 tablets (12.5-25 mg total) by mouth daily. If blood pressure over 145 when you check- ok to take other half tablet once per day (so total of 25mg  per day) Patient taking differently: Take 12.5 mg by mouth See admin instructions. Take 12.5 mg by mouth once a day and if Systolic B/P number is over 145 when checked, may take an additional 12.5 mg once a day (max total of 25 mg/day) 09/21/22   Shelva Majestic, MD  metoprolol tartrate (LOPRESSOR) 25 MG tablet TAKE 0.5 TABLETS BY MOUTH 2 TIMES DAILY. 01/29/23   [place], Peter M, MD  pantoprazole (PROTONIX) 40 MG tablet Take 1 tablet (40 mg total) by mouth 2 (two) times  daily. 11/03/22 02/01/23  Dahal, Melina Schools, MD  polyethylene glycol (MIRALAX / GLYCOLAX) 17 g packet Take 17 g by mouth daily as needed for moderate constipation (mix and drink as directed).    [provider]  prochlorperazine (COMPAZINE) 10 MG tablet Take 1 tablet (10 mg total) by mouth every 8 (eight) hours as needed for nausea or vomiting (take with 12.5mg  benadryl please). 10/27/22   Small, Brooke L, PA  SYSTANE BALANCE 0.6 % SOLN Apply to eye. 11/11/22   [provider]  tamsulosin (FLOMAX) 0.4 MG CAPS capsule Take 0.4 mg by mouth daily. 12/30/21   [provider]  THERATEARS NIGHTTIME 1 % GEL Apply to eye. 11/11/22   [provider]  thiamine (VITAMIN B1) 100 MG tablet Take 1 tablet (100 mg total) by mouth daily. 11/10/22   Amin, Kindred Heying Chirag, MD  VIGAMOX 0.5 % ophthalmic solution Place 1 drop into both eyes See admin instructions. Instill every 4 weeks as directed - use with avastin, instill 1 drop into both eyes 4 times daily for 2 days after eye injections 05/28/14   [provider]      Allergies    Tape, Aspirin, Percocet [oxycodone-acetaminophen], and Vitamins [apatate]    Review of Systems   Review of Systems  All other systems reviewed and are negative.   Physical Exam Updated Vital Signs BP (!) 157/119 (BP Location: Left Arm)   Pulse 76   Temp 97.9 F (36.6 C)   SpO2 94%  Physical Exam Vitals and nursing note reviewed.  Constitutional:      Appearance: He is well-developed.  HENT:     Head: Atraumatic.  Cardiovascular:     Rate and Rhythm: Normal rate.  Pulmonary:     Effort: Pulmonary effort is normal.     Breath sounds: No decreased breath sounds or rales.  Musculoskeletal:     Cervical back: Neck supple.  Skin:    General: Skin is warm.  Neurological:     Mental Status: He is alert and oriented to person, place, and time.     ED Results / Procedures / Treatments   Labs (all labs ordered are listed, but only abnormal results are displayed) Labs Reviewed  URINALYSIS, ROUTINE W REFLEX MICROSCOPIC - Abnormal; Notable for the following components:      Result Value   APPearance CLOUDY (*)    Hgb urine dipstick SMALL (*)    Protein, ur 100 (*)    Nitrite POSITIVE (*)    Leukocytes,Ua LARGE (*)    Bacteria, UA RARE (*)    All other components within normal limits  BASIC METABOLIC PANEL - Abnormal; Notable for the following components:   Glucose, Bld 134 (*)    Calcium 8.5 (*)    All other components within normal limits  CBC WITH DIFFERENTIAL/PLATELET - Abnormal; Notable for the following components:   Platelets 136 (*)    All other components within normal  limits  BRAIN NATRIURETIC PEPTIDE  MAGNESIUM    EKG EKG Interpretation Date/Time:  Friday May 14 2023 06:41:13 EDT Ventricular Rate:  76 PR Interval:  229 QRS Duration:  90 QT Interval:  372 QTC Calculation: 419 R Axis:   82  Text Interpretation: Sinus rhythm Prolonged PR interval Probable anterior infarct, old Borderline repolarization abnormality No significant change was found Confirmed by Glynn Octave [phone]) on 05/14/2023 7:44:31 AM  Radiology DG Chest 2 View  Result Date: 05/14/2023 CLINICAL DATA:  88 year old male who woke with shortness of  breath overnight. Productive cough. EXAM: CHEST - 2 VIEW COMPARISON:  Portable chest 10/29/2022 and earlier. FINDINGS: PA and lateral views at 0655 hours. Lung volumes, mediastinal contours are stable since 2013 and within normal limits. Visualized tracheal air column is within normal limits. Stable lung markings since 2013. No pneumothorax, pulmonary edema, pleural effusion or confluent lung opacity. Numerous chronic upper abdominal surgical clips. Negative visible bowel gas. Right total shoulder arthroplasty. No acute osseous abnormality identified. IMPRESSION: No acute cardiopulmonary abnormality. Electronically Signed   By: Odessa Fleming M.D.   On: 05/14/2023 07:05    Procedures Procedures    Medications Ordered in ED Medications  metoprolol tartrate (LOPRESSOR) tablet 12.5 mg (12.5 mg Oral Given 05/14/23 0915)    ED Course/ Medical Decision Making/ A&P                             Medical Decision Making Amount and/or Complexity of Data Reviewed Labs: ordered. Radiology: ordered.  Risk Prescription drug management.   88 year old male comes in with chief complaint of shortness of breath.  He has history of A-fib, CAD.  Patient had sudden onset shortness of breath in the middle night that woke him up.  According to son, patient was tachycardic.  Patient is back to baseline at this point according to him and also the  son.  Differential diagnosis considered for this patient includes A-fib with RVR, CHF, acute coronary syndrome.  EKG ordered along with basic labs to make sure there is no profound electrolyte abnormality.   we will monitor the patient on telemetry.   Patient has chronic indwelling Foley catheter, urine sample was taken prior to my assessment by the nursing staff straight from the back.  Patient clinically has no UTI, UA will not have bearing on this visit.  10:14 AM I have independently reviewed and interpreted patient's telemetry strip.  No arrhythmia. I have reviewed patient's previous outpatient notes with family medicine.  I have given patient 12.5 mg of metoprolol here, to which she has responded well. Son will give rest of the medication at home.  Stable for discharge.  Final Clinical Impression(s) / ED Diagnoses Final diagnoses:  Dyspnea, unspecified type    Rx / DC Orders ED Discharge Orders     None         Derwood Kaplan, MD 05/14/23 1015

## 2023-05-14 NOTE — Discharge Instructions (Addendum)
We suspect that the shortness of breath at home was because of atrial fibrillation related rapid heart rate.  The workup in the emergency room is reassuring.  X-rays, blood work is normal.  Heart rate has stayed steady and within normal limits.  Please make sure you take your medications as prescribed.  Return to the ER if you start having worsening symptoms again.

## 2023-05-16 ENCOUNTER — Encounter: Payer: Self-pay | Admitting: Family Medicine

## 2023-05-17 ENCOUNTER — Ambulatory Visit (INDEPENDENT_AMBULATORY_CARE_PROVIDER_SITE_OTHER): Payer: Medicare Other | Admitting: Family Medicine

## 2023-05-17 ENCOUNTER — Encounter: Payer: Self-pay | Admitting: Family Medicine

## 2023-05-17 VITALS — BP 136/68 | HR 55 | Temp 97.5°F | Ht 72.0 in | Wt 166.8 lb

## 2023-05-17 DIAGNOSIS — I10 Essential (primary) hypertension: Secondary | ICD-10-CM | POA: Diagnosis not present

## 2023-05-17 DIAGNOSIS — I480 Paroxysmal atrial fibrillation: Secondary | ICD-10-CM | POA: Diagnosis not present

## 2023-05-17 DIAGNOSIS — F419 Anxiety disorder, unspecified: Secondary | ICD-10-CM | POA: Diagnosis not present

## 2023-05-17 MED ORDER — BUSPIRONE HCL 5 MG PO TABS: 5.0000 mg | ORAL_TABLET | Freq: Three times a day (TID) | ORAL | 5 refills | Status: DC

## 2023-05-17 MED ORDER — LORAZEPAM 0.5 MG PO TABS: 0.5000 mg | ORAL_TABLET | Freq: Every day | ORAL | 1 refills | Status: DC | PRN

## 2023-05-17 NOTE — Patient Instructions (Addendum)
Increase buspirone to 5 mg three times a day- with one dose before bed  If he wakes up in middle of night can give 1 ativan if that is not enough can give a second 30 minutes later. This does increase fall risk so needs supervision/someone close after use  Recommended follow up: Return for next already scheduled visit or sooner if needed.

## 2023-05-17 NOTE — Progress Notes (Signed)
Phone [phone] In person visit   Subjective:   Joseph Simpson is a 88 y.o. year old very pleasant male patient who presents for/with See problem oriented charting Chief Complaint  Patient presents with   sleep and memory    Pt would like to discuss sleep and memory issues (see pt mychart message)   Past Medical History-  Patient Active Problem List   Diagnosis Date Noted   GI bleed 01/07/2022    Priority: High   Paroxysmal atrial fibrillation (HCC)     Priority: High   CAD (coronary artery disease)     Priority: High   Macular degeneration 11/17/2011    Priority: High   Anxiety 05/17/2023    Priority: Medium    Hyperglycemia 12/21/2019    Priority: Medium    B12 deficiency 03/09/2019    Priority: Medium    Gastroparesis 02/15/2017    Priority: Medium    Hx of adenomatous colonic polyps 02/15/2017    Priority: Medium    Hyperlipidemia 06/10/2016    Priority: Medium    BPH (benign prostatic hyperplasia) 11/23/2014    Priority: Medium    Palpitations 07/31/2014    Priority: Medium    Dyspnea 02/27/2014    Priority: Medium    Essential hypertension 09/23/2007    Priority: Medium    GERD 09/23/2007    Priority: Medium    Osteoarthritis of left hip 01/30/2019    Priority: Low   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 12/29/2017    Priority: Low   Left inguinal hernia 07/12/2016    Priority: Low   PAC (premature atrial contraction) 10/25/2015    Priority: Low   Squamous cell carcinoma in situ 09/09/2015    Priority: Low   Cough 05/10/2014    Priority: Low   Pulmonary nodule 05/10/2014    Priority: Low   Murmur 02/10/2012    Priority: Low   Left wrist pain 10/01/2021    Priority: 1.   Bilateral foot pain 07/04/2021    Priority: 1.   S/P hip replacement, left 02/11/2021    Priority: 1.   Delirium 11/06/2022   Altered mental status 11/05/2022   Abdominal pain, epigastric 11/02/2022   Intractable nausea and vomiting 10/30/2022    Gastritis and duodenitis 10/29/2022    Medications- reviewed and updated Current Outpatient Medications  Medication Sig Dispense Refill   acetaminophen (TYLENOL) 500 MG tablet Take 500-1,000 mg by mouth 2 (two) times daily as needed for mild pain, moderate pain or headache.     apixaban (ELIQUIS) 5 MG TABS tablet TAKE 1 TABLET BY MOUTH TWICE A DAY 180 tablet 1   atorvastatin (LIPITOR) 20 MG tablet TAKE ONE TABLET BY MOUTH THREE TIMES A WEEK 36 tablet 4   BENADRYL ALLERGY 25 MG tablet Take 12.5 mg by mouth See admin instructions. Take 12.5 mg by mouth every 8 hours when taking Prochlorperazine     bevacizumab (AVASTIN) 2.5 mg/0.1 mL SOLN 2.5 mg by Intravitreal route See admin instructions. Injection to eyes every 4 weeks - use with vigamox     Cholecalciferol (VITAMIN D3) 1.25 MG (50000 UT) CAPS Take 1 capsule by mouth once a week.     ciprofloxacin (CIPRO) 500 MG tablet Take 500 mg by mouth 2 (two) times daily.     cyanocobalamin (VITAMIN B12) 1000 MCG/ML injection Inject 1 mL (1,000 mcg total) into the muscle every 30 (thirty) days. By granddaughter who is an RN 3 mL 3   docusate sodium (COLACE) 100  MG capsule Take 200 mg by mouth at bedtime.     LORazepam (ATIVAN) 0.5 MG tablet Take 1-2 tablets (0.5-1 mg total) by mouth daily as needed for anxiety (needs to be supervised 8 hours after use- increases fall risk). 45 tablet 1   losartan (COZAAR) 25 MG tablet Take 0.5-1 tablets (12.5-25 mg total) by mouth daily. If blood pressure over 145 when you check- ok to take other half tablet once per day (so total of 25mg  per day) (Patient taking differently: Take 12.5 mg by mouth See admin instructions. Take 12.5 mg by mouth once a day and if Systolic B/P number is over 145 when checked, may take an additional 12.5 mg once a day (max total of 25 mg/day)) 135 tablet 3   metoprolol tartrate (LOPRESSOR) 25 MG tablet TAKE 0.5 TABLETS BY MOUTH 2 TIMES DAILY. 90 tablet 2   polyethylene glycol (MIRALAX / GLYCOLAX)  17 g packet Take 17 g by mouth daily as needed for moderate constipation (mix and drink as directed).     prochlorperazine (COMPAZINE) 10 MG tablet Take 1 tablet (10 mg total) by mouth every 8 (eight) hours as needed for nausea or vomiting (take with 12.5mg  benadryl please). 10 tablet 0   SYSTANE BALANCE 0.6 % SOLN Apply to eye.     tamsulosin (FLOMAX) 0.4 MG CAPS capsule Take 0.4 mg by mouth daily.     THERATEARS NIGHTTIME 1 % GEL Apply to eye.     thiamine (VITAMIN B1) 100 MG tablet Take 1 tablet (100 mg total) by mouth daily.     VIGAMOX 0.5 % ophthalmic solution Place 1 drop into both eyes See admin instructions. Instill every 4 weeks as directed - use with avastin, instill 1 drop into both eyes 4 times daily for 2 days after eye injections     busPIRone (BUSPAR) 5 MG tablet Take 1 tablet (5 mg total) by mouth 3 (three) times daily. 90 tablet 5   pantoprazole (PROTONIX) 40 MG tablet Take 1 tablet (40 mg total) by mouth 2 (two) times daily. 60 tablet 2   No current facility-administered medications for this visit.     Objective:  BP 136/68   Pulse (!) 55   Temp (!) 97.5 F (36.4 C)   Ht 6' (1.829 m)   Wt 166 lb 12.8 oz (75.7 kg)   SpO2 96%   BMI 22.62 kg/m  Gen: NAD, resting comfortably CV: RRR no murmurs rubs or gallops Lungs: CTAB no crackles, wheeze, rhonchi Ext: trace edema Skin: warm, dry Neuro: Rather quiet-looks to son for most answers normal, moves all extremities     Assessment and Plan   # ED follow-up for reported shortness of breath-possibly related to anxiety S: From MyChart message Joseph 30, 2024 "We need help with Joseph Simpson. The past 3 nights he has awaken around 300am stating he couldn't breathe. The first night we called ambulance and he went to [facility]. They did ekg chest xray and other blood work. Couldn't find anything wrong. Friday night same thing happened. It took [place] over an hour to get him calmed down. He wanted [place] to call ambulance. He didn't remember going the  night before.Joseph Simpson during day he was fine. Then last night same thing. Woke [place] up around 230 saying he couldn't breathe. Took [place] from 230 until after 4 to calm him down. He was getting very angry with [place] cause we wouldn't call an ambulance. We need help. Sounds like he is having panic attacks  but don't know what it is. Can you give [place] something to give him before bedtime to help [place]. We cannot continue to do this every night." -Son today describes essentially the same thing but reports takes 2-3 hours to calm back down pretty much every night 2-3 am. Did have one good night last night. Falling asleep ok.  -Of note indwelling Foley is changed every 3 weeks and required due to history of obstruction  Review of emergency department note shows patient presented with shortness of breath with complaint of sudden onset shortness of breath in the middle night that woke him up and apparently patient had a high heart rate.  There was concern for A-fib with RVR, CHF, ACS.  EKG largely reassuring as well as basic BMP.  He was monitored on telemetry without recurrence and patient had returned to baseline.  Urinalysis was taken straight from bag with indwelling Foley catheter so urinalysis information was deemed not helpful.  Patient was given a 12.5 mg dose of metoprolol and discharged home for outpatient follow-up A/P: Patient has had 2 subsequent nights of very similar symptoms after ED visit of waking up short of breath/panicked and takes several hours to call him back down - I wonder if this could be associated with dreams - I wonder if he also could have developed some microtrauma from the initial night-some speculation that this could have been related to A-fib with RVR even with patient taking his baseline metoprolol - Should follow-up with cardiology - Consider trazodone to help him sleep but he is falling asleep without difficulty and last night was able to sleep-we wonder if he could be having panic  attacks-discussed sparing trial of Ativan as long as son supervises him for 8 hours after use - We also decided to increase buspirone to 3 times a day with 1 dose before bed to see if this is helpful    #Paroxysmal atrial fibrillation-follows with Dr. [place] S: Anticoagulation: Eliquis 5 mg twice daily  -Rate control: Metoprolol 12.5 mg twice daily -family noted rapid irregular heart beat the first night, ambulance also noted it but by time they attached EKG it was not present. Other 2 nights fmaily didn't note this.  A/P: Patient possibly had atrial fibrillation on the first night with RVR-encouraged follow-up with cardiology-continue current medications for now including Eliquis for anticoagulation and metoprolol 12.5 mg twice daily  #Hypertension  S: Controlled on losartan 12.5 mg daily (with extra dose 12.5 mg if blood pressure above 145), metoprolol 12.5 mg twice daily (also helps palpitations- reduced from full tablet due to bradycardia 06/2021) -Orthostatic issues on amlodipine 1.25 mg  BP Readings from Last 3 Encounters:  05/17/23 136/68  05/14/23 (!) 157/119  03/22/23 131/67   A/P: Reasonably well-controlled-do not want to increase dose due to orthostatic history  Recommended follow up: Return for next already scheduled visit or sooner if needed. Future Appointments  Date Time Provider Department Center  06/09/2023  8:20 AM Sherrie George, MD [facility] None  06/22/2023  9:00 AM Marcos Eke, PA-C [facility] None  09/24/2023  9:20 AM Durene Cal Aldine Contes, MD [facility]    Lab/Order associations:   ICD-10-CM   1. Anxiety  F41.9     2. Paroxysmal atrial fibrillation (HCC)  I48.0     3. Essential hypertension  I10       Meds ordered this encounter  Medications   LORazepam (ATIVAN) 0.5 MG tablet    Sig: Take 1-2 tablets (0.5-1 mg total)  by mouth daily as needed for anxiety (needs to be supervised 8 hours after use- increases fall risk).    Dispense:  45 tablet    Refill:  1    busPIRone (BUSPAR) 5 MG tablet    Sig: Take 1 tablet (5 mg total) by mouth 3 (three) times daily.    Dispense:  90 tablet    Refill:  5    Return precautions advised.  Tana Conch, MD

## 2023-05-17 NOTE — Telephone Encounter (Signed)
Patient has been scheduled for 05/17/23 @ 2:20pm w/ PCP.

## 2023-05-17 NOTE — Assessment & Plan Note (Signed)
#   ED follow-up for reported shortness of breath-possibly related to anxiety S: From MyChart message May 16, 2023 "We need help with Joseph Simpson. The past 3 nights he has awaken around 300am stating he couldn't breathe. The first night we called ambulance and he went to [facility]. They did ekg chest xray and other blood work. Couldn't find anything wrong. Friday night same thing happened. It took [place] over an hour to get him calmed down. He wanted [place] to call ambulance. He didn't remember going the night before.[name] during day he was fine. Then last night same thing. Woke [place] up around 230 saying he couldn't breathe. Took [place] from 230 until after 4 to calm him down. He was getting very angry with [place] cause we wouldn't call an ambulance. We need help. Sounds like he is having panic attacks but don't know what it is. Can you give [place] something to give him before bedtime to help [place]. We cannot continue to do this every night." -Son today describes essentially the same thing but reports takes 2-3 hours to calm back down pretty much every night 2-3 am. Did have one good night last night. Falling asleep ok.  -Of note indwelling Foley is changed every 3 weeks and required due to history of obstruction  Review of emergency department note shows patient presented with shortness of breath with complaint of sudden onset shortness of breath in the middle night that woke him up and apparently patient had a high heart rate.  There was concern for A-fib with RVR, CHF, ACS.  EKG largely reassuring as well as basic BMP.  He was monitored on telemetry without recurrence and patient had returned to baseline.  Urinalysis was taken straight from bag with indwelling Foley catheter so urinalysis information was deemed not helpful.  Patient was given a 12.5 mg dose of metoprolol and discharged home for outpatient follow-up A/P: Patient has had 2 subsequent nights of very similar symptoms after ED visit of waking up short of breath/panicked and  takes several hours to call him back down - I wonder if this could be associated with dreams - I wonder if he also could have developed some microtrauma from the initial night-some speculation that this could have been related to A-fib with RVR even with patient taking his baseline metoprolol - Should follow-up with cardiology - Consider trazodone to help him sleep but he is falling asleep without difficulty and last night was able to sleep-we wonder if he could be having panic attacks-discussed sparing trial of Ativan as long as son supervises him for 8 hours after use - We also decided to increase buspirone to 3 times a day with 1 dose before bed to see if this is helpful

## 2023-05-21 ENCOUNTER — Encounter: Payer: Self-pay | Admitting: Gastroenterology

## 2023-05-24 ENCOUNTER — Encounter: Payer: Self-pay | Admitting: Family Medicine

## 2023-05-24 NOTE — Telephone Encounter (Signed)
FYI

## 2023-05-25 ENCOUNTER — Encounter (HOSPITAL_COMMUNITY): Payer: Self-pay

## 2023-05-25 ENCOUNTER — Emergency Department (HOSPITAL_COMMUNITY)
Admission: EM | Admit: 2023-05-25 | Discharge: 2023-05-25 | Disposition: A | Discharge status: Home / Self Care | Payer: Medicare Other | Attending: Emergency Medicine | Admitting: Emergency Medicine

## 2023-05-25 ENCOUNTER — Emergency Department (HOSPITAL_BASED_OUTPATIENT_CLINIC_OR_DEPARTMENT_OTHER): Payer: Medicare Other

## 2023-05-25 ENCOUNTER — Emergency Department (HOSPITAL_COMMUNITY): Payer: Medicare Other

## 2023-05-25 DIAGNOSIS — R918 Other nonspecific abnormal finding of lung field: Secondary | ICD-10-CM | POA: Diagnosis not present

## 2023-05-25 DIAGNOSIS — F419 Anxiety disorder, unspecified: Secondary | ICD-10-CM | POA: Diagnosis not present

## 2023-05-25 DIAGNOSIS — R079 Chest pain, unspecified: Secondary | ICD-10-CM | POA: Insufficient documentation

## 2023-05-25 DIAGNOSIS — R1110 Vomiting, unspecified: Secondary | ICD-10-CM | POA: Insufficient documentation

## 2023-05-25 DIAGNOSIS — Z79899 Other long term (current) drug therapy: Secondary | ICD-10-CM | POA: Diagnosis not present

## 2023-05-25 DIAGNOSIS — R0602 Shortness of breath: Secondary | ICD-10-CM | POA: Insufficient documentation

## 2023-05-25 DIAGNOSIS — I700 Atherosclerosis of aorta: Secondary | ICD-10-CM | POA: Diagnosis not present

## 2023-05-25 DIAGNOSIS — M79662 Pain in left lower leg: Secondary | ICD-10-CM

## 2023-05-25 DIAGNOSIS — Z96611 Presence of right artificial shoulder joint: Secondary | ICD-10-CM | POA: Diagnosis not present

## 2023-05-25 DIAGNOSIS — I10 Essential (primary) hypertension: Secondary | ICD-10-CM | POA: Diagnosis not present

## 2023-05-25 DIAGNOSIS — Z7901 Long term (current) use of anticoagulants: Secondary | ICD-10-CM | POA: Diagnosis not present

## 2023-05-25 DIAGNOSIS — R457 State of emotional shock and stress, unspecified: Secondary | ICD-10-CM | POA: Diagnosis not present

## 2023-05-25 LAB — CBC WITH DIFFERENTIAL/PLATELET
Abs Immature Granulocytes: 0.02 10*3/uL (ref 0.00–0.07)
Basophils Absolute: 0 10*3/uL (ref 0.0–0.1)
Basophils Relative: 0 %
Eosinophils Absolute: 0.2 10*3/uL (ref 0.0–0.5)
Eosinophils Relative: 2 %
HCT: 48.1 % (ref 39.0–52.0)
Hemoglobin: 15.7 g/dL (ref 13.0–17.0)
Immature Granulocytes: 0 %
Lymphocytes Relative: 10 %
Lymphs Abs: 0.8 10*3/uL (ref 0.7–4.0)
MCH: 29.6 pg (ref 26.0–34.0)
MCHC: 32.6 g/dL (ref 30.0–36.0)
MCV: 90.6 fL (ref 80.0–100.0)
Monocytes Absolute: 0.6 10*3/uL (ref 0.1–1.0)
Monocytes Relative: 7 %
Neutro Abs: 6.4 10*3/uL (ref 1.7–7.7)
Neutrophils Relative %: 81 %
Platelets: 136 10*3/uL — ABNORMAL LOW (ref 150–400)
RBC: 5.31 MIL/uL (ref 4.22–5.81)
RDW: 14.5 % (ref 11.5–15.5)
WBC: 8 10*3/uL (ref 4.0–10.5)
nRBC: 0 % (ref 0.0–0.2)

## 2023-05-25 LAB — LIPASE, BLOOD: Lipase: 40 U/L (ref 11–51)

## 2023-05-25 LAB — MAGNESIUM: Magnesium: 2.1 mg/dL (ref 1.7–2.4)

## 2023-05-25 LAB — COMPREHENSIVE METABOLIC PANEL
ALT: 20 U/L (ref 0–44)
AST: 19 U/L (ref 15–41)
Albumin: 3.7 g/dL (ref 3.5–5.0)
Alkaline Phosphatase: 93 U/L (ref 38–126)
Anion gap: 9 (ref 5–15)
BUN: 18 mg/dL (ref 8–23)
CO2: 27 mmol/L (ref 22–32)
Calcium: 8.8 mg/dL — ABNORMAL LOW (ref 8.9–10.3)
Chloride: 105 mmol/L (ref 98–111)
Creatinine, Ser: 0.83 mg/dL (ref 0.61–1.24)
GFR, Estimated: >60 mL/min (ref >60)
Glucose, Bld: 116 mg/dL — ABNORMAL HIGH (ref 70–99)
Potassium: 3.7 mmol/L (ref 3.5–5.1)
Sodium: 141 mmol/L (ref 135–145)
Total Bilirubin: 1.5 mg/dL — ABNORMAL HIGH (ref 0.3–1.2)
Total Protein: 7.1 g/dL (ref 6.5–8.1)

## 2023-05-25 LAB — TSH: TSH: 4.123 u[IU]/mL (ref 0.350–4.500)

## 2023-05-25 LAB — TROPONIN I (HIGH SENSITIVITY)
Troponin I (High Sensitivity): 10 ng/L (ref <18)
Troponin I (High Sensitivity): 9 ng/L (ref <18)

## 2023-05-25 LAB — D-DIMER, QUANTITATIVE: D-Dimer, Quant: 0.28 ug/mL-FEU (ref 0.00–0.50)

## 2023-05-25 LAB — BRAIN NATRIURETIC PEPTIDE: B Natriuretic Peptide: 85.4 pg/mL (ref 0.0–100.0)

## 2023-05-25 NOTE — Discharge Instructions (Signed)
Your history, exam, workup today did not reveal an emergent cause of the shortness of breath anxiety and panic that you had this morning.  Your heart enzymes were negative both times we checked them, your blood clot test was negative, and the ultrasound did not show blood clot in your leg that was bothering you.  Your x-ray did not show acute abnormality and you remained stable for over 7 hours here in the emergency department without other concerning findings on workup.  We feel you are safe for discharge home however given these recurrent episodes we do recommend calling your primary doctor to discuss other medication changes.  Please rest and stay hydrated.  If any symptoms change or worsen, please return to the nearest emergency department.

## 2023-05-25 NOTE — ED Triage Notes (Signed)
Pt BIBA from home for anxiety and SHOB. Seen recently for same. Family gave 1mg  ativan at home and pt was calm with EMS. Calm on arrival, no respiratory distress. Family also gave metoprolol at home. Lung sounds clear  180/100 HR 70 98% RA

## 2023-05-25 NOTE — ED Provider Notes (Signed)
[place] EMERGENCY DEPARTMENT AT [facility] Provider Note   CSN: [id number] Arrival date & time: 05/25/23  4098     History  No chief complaint on file.   Joseph Simpson is a 88 y.o. male.  The history is provided by the patient and medical records. No language interpreter was used.  Shortness of Breath Severity:  Severe Onset quality:  Sudden Duration:  30 minutes Timing:  Constant Progression:  Resolved Chronicity:  Recurrent Context: not URI   Relieved by: metoprolol and ativan. Worsened by:  Nothing Ineffective treatments:  None tried Associated symptoms: chest pain and vomiting   Associated symptoms: no abdominal pain, no cough, no fever, no headaches, no neck pain, no sputum production and no wheezing        Home Medications Prior to Admission medications   Medication Sig Start Date End Date Taking? Authorizing Provider  acetaminophen (TYLENOL) 500 MG tablet Take 500-1,000 mg by mouth 2 (two) times daily as needed for mild pain, moderate pain or headache.    [provider]  apixaban (ELIQUIS) 5 MG TABS tablet TAKE 1 TABLET BY MOUTH TWICE A DAY 03/15/23   [place], Peter M, MD  atorvastatin (LIPITOR) 20 MG tablet TAKE ONE TABLET BY MOUTH THREE TIMES A WEEK 11/12/22   Shelva Majestic, MD  BENADRYL ALLERGY 25 MG tablet Take 12.5 mg by mouth See admin instructions. Take 12.5 mg by mouth every 8 hours when taking Prochlorperazine    [provider]  bevacizumab (AVASTIN) 2.5 mg/0.1 mL SOLN 2.5 mg by Intravitreal route See admin instructions. Injection to eyes every 4 weeks - use with vigamox    [provider]  busPIRone (BUSPAR) 5 MG tablet Take 1 tablet (5 mg total) by mouth 3 (three) times daily. 05/17/23   Shelva Majestic, MD  Cholecalciferol (VITAMIN D3) 1.25 MG (50000 UT) CAPS Take 1 capsule by mouth once a week. 11/22/22   [provider]  ciprofloxacin (CIPRO) 500 MG tablet Take 500 mg by mouth 2 (two) times daily.  12/30/22   [provider]  cyanocobalamin (VITAMIN B12) 1000 MCG/ML injection Inject 1 mL (1,000 mcg total) into the muscle every 30 (thirty) days. By granddaughter who is an RN 01/04/23   Shelva Majestic, MD  docusate sodium (COLACE) 100 MG capsule Take 200 mg by mouth at bedtime.    [provider]  LORazepam (ATIVAN) 0.5 MG tablet Take 1-2 tablets (0.5-1 mg total) by mouth daily as needed for anxiety (needs to be supervised 8 hours after use- increases fall risk). 05/17/23   Shelva Majestic, MD  losartan (COZAAR) 25 MG tablet Take 0.5-1 tablets (12.5-25 mg total) by mouth daily. If blood pressure over 145 when you check- ok to take other half tablet once per day (so total of 25mg  per day) Patient taking differently: Take 12.5 mg by mouth See admin instructions. Take 12.5 mg by mouth once a day and if Systolic B/P number is over 145 when checked, may take an additional 12.5 mg once a day (max total of 25 mg/day) 09/21/22   Shelva Majestic, MD  metoprolol tartrate (LOPRESSOR) 25 MG tablet TAKE 0.5 TABLETS BY MOUTH 2 TIMES DAILY. 01/29/23   [place], Peter M, MD  pantoprazole (PROTONIX) 40 MG tablet Take 1 tablet (40 mg total) by mouth 2 (two) times daily. 11/03/22 02/01/23  Dahal, Melina Schools, MD  polyethylene glycol (MIRALAX / GLYCOLAX) 17 g packet Take 17 g by mouth daily as needed  for moderate constipation (mix and drink as directed).    [provider]  prochlorperazine (COMPAZINE) 10 MG tablet Take 1 tablet (10 mg total) by mouth every 8 (eight) hours as needed for nausea or vomiting (take with 12.5mg  benadryl please). 10/27/22   Small, Brooke L, PA  SYSTANE BALANCE 0.6 % SOLN Apply to eye. 11/11/22   [provider]  tamsulosin (FLOMAX) 0.4 MG CAPS capsule Take 0.4 mg by mouth daily. 12/30/21   [provider]  THERATEARS NIGHTTIME 1 % GEL Apply to eye. 11/11/22   [provider]  thiamine (VITAMIN B1) 100 MG tablet Take 1 tablet (100 mg total) by  mouth daily. 11/10/22   Amin, Ankit Chirag, MD  VIGAMOX 0.5 % ophthalmic solution Place 1 drop into both eyes See admin instructions. Instill every 4 weeks as directed - use with avastin, instill 1 drop into both eyes 4 times daily for 2 days after eye injections 05/28/14   [provider]      Allergies    Tape, Aspirin, Percocet [oxycodone-acetaminophen], and Vitamins [apatate]    Review of Systems   Review of Systems  Constitutional:  Negative for chills, fatigue and fever.  HENT:  Negative for congestion.   Eyes:  Negative for visual disturbance.  Respiratory:  Positive for chest tightness and shortness of breath. Negative for cough, sputum production and wheezing.   Cardiovascular:  Positive for chest pain. Negative for palpitations.  Gastrointestinal:  Positive for nausea and vomiting. Negative for abdominal pain, constipation and diarrhea.  Genitourinary:  Negative for dysuria and flank pain.  Musculoskeletal:  Negative for back pain, neck pain and neck stiffness.  Skin:  Negative for wound.  Neurological:  Negative for weakness, light-headedness, numbness and headaches.  Psychiatric/Behavioral:  Negative for agitation.   All other systems reviewed and are negative.   Physical Exam Updated Vital Signs BP (!) 173/119 (BP Location: Right Arm)   Pulse 78   Temp 98.5 F (36.9 C) (Oral)   Resp 19   SpO2 98%  Physical Exam Vitals and nursing note reviewed.  Constitutional:      General: He is not in acute distress.    Appearance: He is well-developed. He is not ill-appearing, toxic-appearing or diaphoretic.  HENT:     Head: Normocephalic and atraumatic.     Mouth/Throat:     Mouth: Mucous membranes are moist.  Eyes:     Conjunctiva/sclera: Conjunctivae normal.     Pupils: Pupils are equal, round, and reactive to light.  Cardiovascular:     Rate and Rhythm: Normal rate and regular rhythm.     Heart sounds: No murmur heard. Pulmonary:     Effort: Pulmonary effort  is normal. No respiratory distress.     Breath sounds: Normal breath sounds. No rhonchi or rales.  Chest:     Chest wall: No tenderness.  Abdominal:     Palpations: Abdomen is soft.     Tenderness: There is no abdominal tenderness.  Musculoskeletal:        General: No swelling.     Cervical back: Neck supple.     Right lower leg: No tenderness. No edema.     Left lower leg: No tenderness. No edema.  Skin:    General: Skin is warm and dry.     Capillary Refill: Capillary refill takes less than 2 seconds.  Neurological:     General: No focal deficit present.     Mental Status: He is alert.  Psychiatric:  Mood and Affect: Mood normal.     ED Results / Procedures / Treatments   Labs (all labs ordered are listed, but only abnormal results are displayed) Labs Reviewed  CBC WITH DIFFERENTIAL/PLATELET - Abnormal; Notable for the following components:      Result Value   Platelets 136 (*)    All other components within normal limits  COMPREHENSIVE METABOLIC PANEL - Abnormal; Notable for the following components:   Glucose, Bld 116 (*)    Calcium 8.8 (*)    Total Bilirubin 1.5 (*)    All other components within normal limits  LIPASE, BLOOD  MAGNESIUM  BRAIN NATRIURETIC PEPTIDE  TSH  D-DIMER, QUANTITATIVE  TROPONIN I (HIGH SENSITIVITY)  TROPONIN I (HIGH SENSITIVITY)    EKG EKG Interpretation Date/Time:  Tuesday May 25 2023 09:29:55 EDT Ventricular Rate:  69 PR Interval:  221 QRS Duration:  103 QT Interval:  403 QTC Calculation: 432 R Axis:   62  Text Interpretation: Sinus rhythm Prolonged PR interval Probable left ventricular hypertrophy ST elevation, consider anterior injury when compared to prior, similar appearance. NO STEMI Confirmed by Theda Belfast ([id number]) on 05/25/2023 10:16:22 AM  Radiology VAS [place] LOWER EXTREMITY VENOUS (DVT) (ONLY [facility] & [facility])  Result Date: 05/25/2023  Lower Venous DVT Study Patient Name:  VED MARTOS Buenger  Date of Exam:   05/25/2023 Medical Rec  #: [id number]       Accession #:    [id number] Date of Birth: 05-May-1935        Patient Gender: M Patient Age:   75 years Exam Location:  [facility] Procedure:      VAS [place] LOWER EXTREMITY VENOUS (DVT) Referring Phys: Lynden Oxford --------------------------------------------------------------------------------  Indications: Left leg pain.  Comparison Study: No prior studies. Performing Technologist: Jean Rosenthal RDMS, RVT  Examination Guidelines: A complete evaluation includes B-mode imaging, spectral Doppler, color Doppler, and power Doppler as needed of all accessible portions of each vessel. Bilateral testing is considered an integral part of a complete examination. Limited examinations for reoccurring indications may be performed as noted. The reflux portion of the exam is performed with the patient in reverse Trendelenburg.  +-----+---------------+---------+-----------+----------+--------------+ RIGHTCompressibilityPhasicitySpontaneityPropertiesThrombus Aging +-----+---------------+---------+-----------+----------+--------------+ CFV  Full           Yes      Yes                                 +-----+---------------+---------+-----------+----------+--------------+   +---------+---------------+---------+-----------+----------+--------------+ LEFT     CompressibilityPhasicitySpontaneityPropertiesThrombus Aging +---------+---------------+---------+-----------+----------+--------------+ CFV      Full           Yes      Yes                                 +---------+---------------+---------+-----------+----------+--------------+ SFJ      Full                                                        +---------+---------------+---------+-----------+----------+--------------+ FV Prox  Full                                                        +---------+---------------+---------+-----------+----------+--------------+  FV Mid   Full                                                         +---------+---------------+---------+-----------+----------+--------------+ FV DistalFull                                                        +---------+---------------+---------+-----------+----------+--------------+ PFV      Full                                                        +---------+---------------+---------+-----------+----------+--------------+ POP      Full           Yes      Yes                                 +---------+---------------+---------+-----------+----------+--------------+ PTV      Full                                                        +---------+---------------+---------+-----------+----------+--------------+ PERO     Full                                                        +---------+---------------+---------+-----------+----------+--------------+     Summary: RIGHT: - No evidence of common femoral vein obstruction.  LEFT: - There is no evidence of deep vein thrombosis in the lower extremity.  - No cystic structure found in the popliteal fossa.  *See table(s) above for measurements and observations. Electronically signed by Lemar Livings MD on 05/25/2023 at 2:42:38 PM.    Final    DG Chest 2 View  Result Date: 05/25/2023 CLINICAL DATA:  Provided history: Shortness of breath. EXAM: CHEST - 2 VIEW COMPARISON:  Prior chest radiograph 05/14/2023 and earlier. FINDINGS: Heart size is within normal limits. Aortic atherosclerosis. Chronic prominence of the interstitial lung markings within both lung bases, unchanged. No appreciable airspace consolidation or pulmonary edema. No evidence of pleural effusion or pneumothorax. No acute osseous abnormality identified. Degenerative changes of the spine. Right shoulder arthroplasty. Surgical clips within the upper abdomen. IMPRESSION: 1. No evidence of an acute cardiopulmonary abnormality. 2. Chronic prominence of the interstitial lung markings within the lung bases. 3. Aortic  Atherosclerosis (ICD10-I70.0). Electronically Signed   By: Jackey Loge D.O.   On: 05/25/2023 09:51    Procedures Procedures    Medications Ordered in ED Medications - No data to display  ED Course/ Medical Decision Making/ A&P  Medical Decision Making Amount and/or Complexity of Data Reviewed Labs: ordered. Radiology: ordered.    ZACARI STIFF is a 88 y.o. male with a past medical history significant for CAD, hypertension, atrial fibrillation on Eliquis therapy, sleep apnea, kidney stones, gastroparesis, diverticulosis, previous esophagitis, and GERD who presents for shortness of breath, chest discomfort, nausea, and vomiting.  According to patient, for the last few weeks he has been having recurrent episodes of waking up extremely short of breath where he cannot breathe.  Per his report and EMS report to nursing, patient woke about 8 AM gasping unable to breathe.  He reportedly was given Ativan and his metoprolol and by the time EMS arrived his symptoms were already improving.  He now feels well but said that the time he felt he could not breathe or take a deep breath.  He also reports he had some nausea and vomiting and chest pressure but denied any new cough or congestion.  He denies fevers or chills.  He denies any abdominal pain back pain or flank pain.  He reports it was a tightness and pressure in his chest.  He reports he has had some left leg cramping that is new in his calf but otherwise denies trauma.  Reports he always uses a Foley and this is no different.  Denies any bowel changes otherwise.  Denies any other complaints and says that he is symptom-free now.   On exam, lungs clear.  No wheezing.  No rhonchi.  Chest nontender.  He does have a murmur.  Abdomen nontender.  Good pulses in extremities.  Patient does not have any significant edema in his legs on my exam.  He has no focal tenderness on his leg but he reported the cramping in his left calf.   Patient heart rate was not tachycardic and his blood pressure was slightly elevated.  He was afebrile.  Patient in no distress at this time.  EKG revealed no STEMI.   Given the patient's recurrent visits for the same, and his recent negative workups, I suspect this is more anxiety attacks as the Ativan and metoprolol seem to help him this morning however with these recurrent episodes of inability to breathe along with some newer chest pressure nausea and vomiting, with his cardiac history I do feel it is reasonable to get workup to rule out other etiologies.  Will get troponin, will get chest x-ray, will get labs.  With the left leg discomfort, will get ultrasound of the left leg and will get a D-dimer.  Will get some other labs as well.  Anticipate reassessment after workup to determine disposition.  Patient's workup returned overall reassuring.  Troponin negative x 2, D-dimer negative.  Ultrasound negative of the leg.  Chest x-ray showed no acute pneumonia or other acute abnormality.  Other labs reassuring with all the findings together with himself and family.  Suspect recurrent panic attack this morning.  We discussed having him call his regular doctor to discuss medication changes but given his stability for over 7 hours he feel he is safe for discharge home.  Both he and family agree with discharge home and patient was discharged in good condition.         Final Clinical Impression(s) / ED Diagnoses Final diagnoses:  Shortness of breath    Rx / DC Orders ED Discharge Orders     None       Clinical Impression: 1. Shortness of breath     Disposition: Discharge  Condition:  Good  I have discussed the results, Dx and Tx plan with the pt(& family if present). He/she/they expressed understanding and agree(s) with the plan. Discharge instructions discussed at great length. Strict return precautions discussed and pt &/or family have verbalized understanding of the instructions.  No further questions at time of discharge.    New Prescriptions   No medications on file    Follow Up: Shelva Majestic, MD [address] [place] [address] [phone]     [facility] Emergency Department at [facility] [address] [id number] mc [place] [address] [phone]        Mikhayla Phillis, Canary Brim, MD 05/25/23 [phone]

## 2023-05-26 ENCOUNTER — Other Ambulatory Visit: Payer: Self-pay

## 2023-05-26 ENCOUNTER — Ambulatory Visit: Payer: Medicare Other | Admitting: Family Medicine

## 2023-05-26 DIAGNOSIS — R531 Weakness: Secondary | ICD-10-CM

## 2023-05-27 DIAGNOSIS — H3530 Unspecified macular degeneration: Secondary | ICD-10-CM | POA: Diagnosis not present

## 2023-05-27 DIAGNOSIS — K5790 Diverticulosis of intestine, part unspecified, without perforation or abscess without bleeding: Secondary | ICD-10-CM | POA: Diagnosis not present

## 2023-05-27 DIAGNOSIS — F419 Anxiety disorder, unspecified: Secondary | ICD-10-CM | POA: Diagnosis not present

## 2023-05-27 DIAGNOSIS — Z87891 Personal history of nicotine dependence: Secondary | ICD-10-CM | POA: Diagnosis not present

## 2023-05-27 DIAGNOSIS — I10 Essential (primary) hypertension: Secondary | ICD-10-CM | POA: Diagnosis not present

## 2023-05-27 DIAGNOSIS — G4730 Sleep apnea, unspecified: Secondary | ICD-10-CM | POA: Diagnosis not present

## 2023-05-27 DIAGNOSIS — I480 Paroxysmal atrial fibrillation: Secondary | ICD-10-CM | POA: Diagnosis not present

## 2023-05-27 DIAGNOSIS — N400 Enlarged prostate without lower urinary tract symptoms: Secondary | ICD-10-CM | POA: Diagnosis not present

## 2023-05-27 DIAGNOSIS — E538 Deficiency of other specified B group vitamins: Secondary | ICD-10-CM | POA: Diagnosis not present

## 2023-05-27 DIAGNOSIS — I700 Atherosclerosis of aorta: Secondary | ICD-10-CM | POA: Diagnosis not present

## 2023-05-27 DIAGNOSIS — I2510 Atherosclerotic heart disease of native coronary artery without angina pectoris: Secondary | ICD-10-CM | POA: Diagnosis not present

## 2023-05-27 DIAGNOSIS — Z7901 Long term (current) use of anticoagulants: Secondary | ICD-10-CM | POA: Diagnosis not present

## 2023-05-27 DIAGNOSIS — Z85828 Personal history of other malignant neoplasm of skin: Secondary | ICD-10-CM | POA: Diagnosis not present

## 2023-05-27 DIAGNOSIS — R911 Solitary pulmonary nodule: Secondary | ICD-10-CM | POA: Diagnosis not present

## 2023-05-27 DIAGNOSIS — K3184 Gastroparesis: Secondary | ICD-10-CM | POA: Diagnosis not present

## 2023-05-27 DIAGNOSIS — R7303 Prediabetes: Secondary | ICD-10-CM | POA: Diagnosis not present

## 2023-05-27 DIAGNOSIS — K219 Gastro-esophageal reflux disease without esophagitis: Secondary | ICD-10-CM | POA: Diagnosis not present

## 2023-05-31 ENCOUNTER — Telehealth: Payer: Self-pay | Admitting: Family Medicine

## 2023-05-31 DIAGNOSIS — I700 Atherosclerosis of aorta: Secondary | ICD-10-CM | POA: Diagnosis not present

## 2023-05-31 DIAGNOSIS — I10 Essential (primary) hypertension: Secondary | ICD-10-CM | POA: Diagnosis not present

## 2023-05-31 DIAGNOSIS — I480 Paroxysmal atrial fibrillation: Secondary | ICD-10-CM | POA: Diagnosis not present

## 2023-05-31 DIAGNOSIS — H3530 Unspecified macular degeneration: Secondary | ICD-10-CM | POA: Diagnosis not present

## 2023-05-31 DIAGNOSIS — I2510 Atherosclerotic heart disease of native coronary artery without angina pectoris: Secondary | ICD-10-CM | POA: Diagnosis not present

## 2023-05-31 DIAGNOSIS — F419 Anxiety disorder, unspecified: Secondary | ICD-10-CM | POA: Diagnosis not present

## 2023-05-31 NOTE — Telephone Encounter (Signed)
Joseph Simpson with home health states pt was discharged from physical therapy today. States pt is at baseline & is doing well. Any questions, can call [phone]

## 2023-05-31 NOTE — Telephone Encounter (Signed)
FYI

## 2023-05-31 NOTE — Telephone Encounter (Signed)
Thankful for improvement-Thanks for update

## 2023-06-01 ENCOUNTER — Telehealth: Payer: Self-pay

## 2023-06-01 NOTE — Telephone Encounter (Signed)
Transition Care Management Follow-up Telephone Call Date of discharge and from where: [facility] 7/9 How have you been since you were released from the hospital? Doing good  Any questions or concerns? No  Items Reviewed: Did the pt receive and understand the discharge instructions provided? Yes  Medications obtained and verified? No  Other? No  Any new allergies since your discharge? No  Dietary orders reviewed? No Do you have support at home? Yes     Follow up appointments reviewed:  PCP Hospital f/u appt confirmed? Yes  Scheduled to see PCP on THIS WEEK @ . Specialist Hospital f/u appt confirmed? Yes  Scheduled to see  on  @ . Are transportation arrangements needed? No  If their condition worsens, is the pt aware to call PCP or go to the Emergency Dept.? Yes Was the patient provided with contact information for the PCP's office or ED? Yes Was to pt encouraged to call back with questions or concerns? Yes

## 2023-06-01 NOTE — Telephone Encounter (Signed)
FYI

## 2023-06-01 NOTE — Telephone Encounter (Signed)
Caller is patient's relative, Liborio Nixon. She states that they are bringing patient to another facility on 7/26 and wanted to make sure FMLA paperwork was received and would be competed by then. Caller requests either a call back at [phone] or through MyChart.

## 2023-06-02 ENCOUNTER — Other Ambulatory Visit: Payer: Self-pay | Admitting: Family Medicine

## 2023-06-02 DIAGNOSIS — M792 Neuralgia and neuritis, unspecified: Secondary | ICD-10-CM | POA: Diagnosis not present

## 2023-06-02 DIAGNOSIS — I739 Peripheral vascular disease, unspecified: Secondary | ICD-10-CM | POA: Diagnosis not present

## 2023-06-02 DIAGNOSIS — B351 Tinea unguium: Secondary | ICD-10-CM | POA: Diagnosis not present

## 2023-06-03 ENCOUNTER — Ambulatory Visit: Payer: Medicare Other | Admitting: Family Medicine

## 2023-06-03 ENCOUNTER — Telehealth: Payer: Self-pay | Admitting: Family Medicine

## 2023-06-03 DIAGNOSIS — I10 Essential (primary) hypertension: Secondary | ICD-10-CM | POA: Diagnosis not present

## 2023-06-03 DIAGNOSIS — I2510 Atherosclerotic heart disease of native coronary artery without angina pectoris: Secondary | ICD-10-CM | POA: Diagnosis not present

## 2023-06-03 DIAGNOSIS — H3530 Unspecified macular degeneration: Secondary | ICD-10-CM | POA: Diagnosis not present

## 2023-06-03 DIAGNOSIS — F419 Anxiety disorder, unspecified: Secondary | ICD-10-CM | POA: Diagnosis not present

## 2023-06-03 DIAGNOSIS — I700 Atherosclerosis of aorta: Secondary | ICD-10-CM | POA: Diagnosis not present

## 2023-06-03 DIAGNOSIS — I480 Paroxysmal atrial fibrillation: Secondary | ICD-10-CM | POA: Diagnosis not present

## 2023-06-03 NOTE — Telephone Encounter (Signed)
error 

## 2023-06-08 NOTE — Telephone Encounter (Signed)
Form has been refaxed.

## 2023-06-08 NOTE — Telephone Encounter (Signed)
Caller is Tonye Becket. States she called for an update on the FL2 form. I informed caller of Keba's message below and she requested to know where this was faxed to specifically. States patient is planning to move into the facility she works at, [employer] [facility] [employer]. States fax number is [phone].

## 2023-06-09 ENCOUNTER — Encounter (INDEPENDENT_AMBULATORY_CARE_PROVIDER_SITE_OTHER): Payer: Medicare Other | Admitting: Ophthalmology

## 2023-06-09 DIAGNOSIS — H33301 Unspecified retinal break, right eye: Secondary | ICD-10-CM

## 2023-06-09 DIAGNOSIS — H43813 Vitreous degeneration, bilateral: Secondary | ICD-10-CM

## 2023-06-09 DIAGNOSIS — H353231 Exudative age-related macular degeneration, bilateral, with active choroidal neovascularization: Secondary | ICD-10-CM | POA: Diagnosis not present

## 2023-06-09 DIAGNOSIS — H35033 Hypertensive retinopathy, bilateral: Secondary | ICD-10-CM

## 2023-06-09 DIAGNOSIS — I10 Essential (primary) hypertension: Secondary | ICD-10-CM

## 2023-06-09 DIAGNOSIS — D3132 Benign neoplasm of left choroid: Secondary | ICD-10-CM

## 2023-06-10 DIAGNOSIS — I480 Paroxysmal atrial fibrillation: Secondary | ICD-10-CM | POA: Diagnosis not present

## 2023-06-10 DIAGNOSIS — H3530 Unspecified macular degeneration: Secondary | ICD-10-CM | POA: Diagnosis not present

## 2023-06-10 DIAGNOSIS — I2510 Atherosclerotic heart disease of native coronary artery without angina pectoris: Secondary | ICD-10-CM | POA: Diagnosis not present

## 2023-06-10 DIAGNOSIS — I10 Essential (primary) hypertension: Secondary | ICD-10-CM | POA: Diagnosis not present

## 2023-06-10 DIAGNOSIS — F419 Anxiety disorder, unspecified: Secondary | ICD-10-CM | POA: Diagnosis not present

## 2023-06-10 DIAGNOSIS — I700 Atherosclerosis of aorta: Secondary | ICD-10-CM | POA: Diagnosis not present

## 2023-06-20 NOTE — Progress Notes (Deleted)
Cardiology Office Note:    Date:  06/20/2023   ID:  Joseph Simpson, DOB Oct 07, 1935, MRN [id number]  PCP:  Joseph Majestic, MD   [facility] Providers Cardiologist:  Joseph Swaziland, MD { Click to update primary MD,subspecialty MD or APP then REFRESH:1}    Referring MD: Joseph Majestic, MD   No chief complaint on file. ***  History of Present Illness:    Joseph Simpson is a 88 y.o. male with a hx of nonobstructive CAD, hypertension, atrial fibrillation on Eliquis, OSA not on CPAP, gastroparesis, previous esophagitis and GERD, hyperlipidemia, Alzheimer's disease, and a history of recurrent panic attacks leading to multiple ER visits.  Abnormal nuclear stress test 2013 led to heart catheterization which demonstrated nonobstructive CAD, no PCI performed.  Due to bradycardia in the 40s, his metoprolol was reduced at a PCP visit August 2022.  He was started on amlodipine but developed dizziness and this was stopped.  He was hospitalized 12/2021 with coffee-ground emesis and UTI.  EGD showed esophagitis and gastritis with some gastric ulcerations.  He was also diagnosed with atrial fibrillation that was rate controlled.  Echocardiogram was unremarkable.  However due to GI bleed he he was not started on anticoagulation, but Eliquis was started as an outpatient.  Event monitor showed paroxysmal atrial fibrillation with a burden of 6%.  He was last seen in clinic by Dr. Swaziland 09/28/2022 and was doing well at that time.  SBP typically in the 140 range.  He was seen at [facility] ED 05/25/2023 for shortness of breath.  He woke up at 8 AM gasping unable to breathe accompanied by chest pressure.  Symptoms are relieved with Ativan and metoprolol.  He ruled out with negative troponin and D-dimer.  EKG was nonischemic.  CXR nonacute.  He was discharged without admission.  He presents today for scheduled visit.    Paroxysmal atrial fibrillation - about 6% burden on heart monitor - rate controlled     Chronic anticoagulation History of GI bleed in February 2023 Has been doing well on Eliquis 5 mg twice daily, appropriately dosed No bleeding   Hypertension - did not tolerate amlodipine due to dizziness - unable to titrate BB due to bradycardia - 25 mg losartan, 12.5 mg lopressor BID - SBP generally in the 140 range   Nonobstructive CAD Hyperlipidemia 03/22/2023: Cholesterol 86; HDL 36.70; LDL Cholesterol 35; Triglycerides 76.0; VLDL 15.2 Continue 20 mg lipitor     Past Medical History:  Diagnosis Date   Aortic atherosclerosis (HCC)    Arthritis    Atrial fibrillation (HCC)    B12 deficiency    Bladder calculi    CAD (coronary artery disease)    cardiologist--- dr Simpson;   abnormal nuclear stress test 11/ 2013;  s/p cardiac cath 10-21-2012 minimal nonobstructiive CAD w/ normal LVF   Diverticulosis    Dyslipidemia    Esophagitis    Full dentures    Gastroparesis    followed by Joseph Dada PA ( [facility] in [place]) s/p gastrectomy for ulcers in 1970s   GERD (gastroesophageal reflux disease)    Heart murmur    Hiatal hernia    History of adenomatous polyp of colon    History of kidney stones    History of melanoma 2008   s/p left forearn wide local excision 06/ 2008   History of syncope 2010   cardiologist --- dr Simpson;  (11-25-2021 per pt no syncope or near syncope since)   Hypertension  Macular degeneration of both eyes    Nocturia associated with benign prostatic hyperplasia    OSA (obstructive sleep apnea)    no cpap   Polyneuropathy    PVC's (premature ventricular contractions) 2010   06/ 2010 per event monitor SR w/ occasional PVCs and 4 beat SVT (in epic)   Wears glasses     Past Surgical History:  Procedure Laterality Date   APPENDECTOMY     child   BIOPSY  11/02/2022   Procedure: BIOPSY;  Surgeon: Joseph Dare, MD;  Location: [facility];  Service: Gastroenterology;;   CARDIAC CATHETERIZATION  10/2012   minimal nonobstructive  CAD with normal LV function   CATARACT EXTRACTION W/ INTRAOCULAR LENS IMPLANT Bilateral    2009 approx   CYSTOSCOPY WITH LITHOLAPAXY N/A 07/13/2016   Procedure: CYSTOSCOPY WITH LITHOLAPAXY;  Surgeon: Joseph Matar, MD;  Location: [facility];  Service: Urology;  Laterality: N/A;   CYSTOSCOPY WITH LITHOLAPAXY N/A 12/01/2021   Procedure: CYSTOSCOPY WITH LITHOLAPAXY;  Surgeon: Joseph Matar, MD;  Location: [facility];  Service: Urology;  Laterality: N/A;   CYSTOSCOPY/RETROGRADE/URETEROSCOPY/STONE EXTRACTION WITH BASKET  02/24/2012   Procedure: CYSTOSCOPY/RETROGRADE/URETEROSCOPY/STONE EXTRACTION WITH BASKET;  Surgeon: Joseph Matar, MD;  Location: [facility];  Service: Urology;  Laterality: Bilateral;  1 HOUR   ESOPHAGOGASTRODUODENOSCOPY N/A 01/06/2022   Procedure: ESOPHAGOGASTRODUODENOSCOPY (EGD);  Surgeon: Joseph Modena, MD;  Location: [facility];  Service: Gastroenterology;  Laterality: N/A;   ESOPHAGOGASTRODUODENOSCOPY (EGD) WITH PROPOFOL N/A 11/02/2022   Procedure: ESOPHAGOGASTRODUODENOSCOPY (EGD) WITH PROPOFOL;  Surgeon: Joseph Dare, MD;  Location: [facility];  Service: Gastroenterology;  Laterality: N/A;   EXTRACORPOREAL SHOCK WAVE LITHOTRIPSY  11/16/2011   @[facility]    GASTRECTOMY     1970s  for ulcers   HEMORRHOID SURGERY     yrs ago   HOLMIUM LASER APPLICATION N/A 12/01/2021   Procedure: HOLMIUM LASER APPLICATION;  Surgeon: Joseph Matar, MD;  Location: [facility];  Service: Urology;  Laterality: N/A;   INGUINAL HERNIA REPAIR Left 07/13/2016   Procedure: REPAIR LEFT INGUINAL HERNIA;  Surgeon: Joseph Level, MD;  Location: [facility];  Service: General;  Laterality: Left;   INSERTION OF MESH Left 07/13/2016   Procedure: INSERTION OF MESH;  Surgeon: Joseph Level, MD;  Location: [facility];  Service: General;  Laterality: Left;   MELANOMA EXCISION Left 05/05/2007   @[facility]  ;   LEFT FOREARM   TONSILLECTOMY     child   TOTAL HIP  ARTHROPLASTY Left 02/11/2021   Procedure: TOTAL HIP ARTHROPLASTY;  Surgeon: Joseph Lucy, MD;  Location: [facility];  Service: Orthopedics;  Laterality: Left;   TOTAL SHOULDER ARTHROPLASTY Right 12/29/2017   Procedure: TOTAL SHOULDER ARTHROPLASTY;  Surgeon: Bjorn Pippin, MD;  Location: [facility];  Service: Orthopedics;  Laterality: Right;    Current Medications: No outpatient medications have been marked as taking for the 06/28/23 encounter (Appointment) with Marcelino Duster, PA.     Allergies:   Tape, Aspirin, Percocet [oxycodone-acetaminophen], and Vitamins [apatate]   Social History   Socioeconomic History   Marital status: Widowed    Spouse name: Not on file   Number of children: 1   Years of education: Not on file   Highest education Simpson: Not on file  Occupational History   Occupation: retired  Tobacco Use   Smoking status: Former    Current packs/day: 0.00    Average packs/day: 0.1 packs/day for 4.0 years (0.4 ttl pk-yrs)    Types: Cigarettes    Start  date: 09/03/1952    Quit date: 09/03/1956    Years since quitting: 66.8   Smokeless tobacco: Never  Vaping Use   Vaping status: Never Used  Substance and Sexual Activity   Alcohol use: No    Alcohol/week: 0.0 standard drinks of alcohol   Drug use: Never   Sexual activity: Yes  Other Topics Concern   Not on file  Social History Narrative   Widowed (lost wife 2015 from lung cancer never smoker), 1 son, 2 grandkids, 1 greatgrandkid (born on Christmas)      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       Hobbies: ride motorcycle (slingshot 3 year), rabbit and dear hunt   Right handed   retired   International aid/development worker of Corporate investment banker Strain: Low Risk  (09/17/2022)   Overall Financial Resource Strain (CARDIA)    Difficulty of Paying Living Expenses: Not hard at all  Food Insecurity: No Food Insecurity (11/05/2022)   Hunger Vital Sign    Worried About Running Out of Food in the Last Year:  Never true    Ran Out of Food in the Last Year: Never true  Transportation Needs: No Transportation Needs (11/05/2022)   PRAPARE - Administrator, Civil Service (Medical): No    Lack of Transportation (Non-Medical): No  Physical Activity: Sufficiently Active (09/17/2022)   Exercise Vital Sign    Days of Exercise per Week: 5 days    Minutes of Exercise per Session: 30 min  Stress: No Stress Concern Present (09/17/2022)   [employer] of Occupational Health - Occupational Stress Questionnaire    Feeling of Stress : Not at all  Social Connections: Moderately Isolated (09/17/2022)   Social Connection and Isolation Panel [NHANES]    Frequency of Communication with Friends and Family: More than three times a week    Frequency of Social Gatherings with Friends and Family: More than three times a week    Attends Religious Services: More than 4 times per year    Active Member of [employer] or Organizations: No    Attends Banker Meetings: Never    Marital Status: Widowed     Family History: The patient's ***family history includes Cancer in his sister; Heart disease in his mother; Lung cancer in his brother; Pneumonia in his father. There is no history of Colon cancer, Esophageal cancer, Liver cancer, Pancreatic cancer, or Rectal cancer.  ROS:   Please see the history of present illness.    *** All other systems reviewed and are negative.  EKGs/Labs/Other Studies Reviewed:    The following studies were reviewed today: ***      Recent Labs: 05/25/2023: ALT 20; B Natriuretic Peptide 85.4; BUN 18; Creatinine, Ser 0.83; Hemoglobin 15.7; Magnesium 2.1; Platelets 136; Potassium 3.7; Sodium 141; TSH 4.123  Recent Lipid Panel    Component Value Date/Time   CHOL 86 03/22/2023 1027   TRIG 76.0 03/22/2023 1027   HDL 36.70 (L) 03/22/2023 1027   CHOLHDL 2 03/22/2023 1027   VLDL 15.2 03/22/2023 1027   LDLCALC 35 03/22/2023 1027   LDLDIRECT 83 06/19/2020 1033     Risk  Assessment/Calculations:   {Does this patient have ATRIAL FIBRILLATION?[phone]}  No BP recorded.  {Refresh Note OR Click here to enter BP  :1}***         Physical Exam:    VS:  There were no vitals taken for this visit.    Wt Readings from Last  3 Encounters:  05/17/23 166 lb 12.8 oz (75.7 kg)  03/22/23 164 lb 3.2 oz (74.5 kg)  01/04/23 166 lb 3.2 oz (75.4 kg)     GEN: *** Well nourished, well developed in no acute distress HEENT: Normal NECK: No JVD; No carotid bruits LYMPHATICS: No lymphadenopathy CARDIAC: ***RRR, no murmurs, rubs, gallops RESPIRATORY:  Clear to auscultation without rales, wheezing or rhonchi  ABDOMEN: Soft, non-tender, non-distended MUSCULOSKELETAL:  No edema; No deformity  SKIN: Warm and dry NEUROLOGIC:  Alert and oriented x 3 PSYCHIATRIC:  Normal affect   ASSESSMENT:    No diagnosis found. PLAN:    In order of problems listed above:  ***      {Are you ordering a CV Procedure (e.g. stress test, cath, DCCV, TEE, etc)?   Press F2        :[id number]}    Medication Adjustments/Labs and Tests Ordered: Current medicines are reviewed at length with the patient today.  Concerns regarding medicines are outlined above.  No orders of the defined types were placed in this encounter.  No orders of the defined types were placed in this encounter.   There are no Patient Instructions on file for this visit.   Signed, Roe Rutherford , PA  06/20/2023 4:21 PM     [facility]

## 2023-06-22 ENCOUNTER — Ambulatory Visit: Payer: Medicare Other | Admitting: Physician Assistant

## 2023-06-24 DIAGNOSIS — I10 Essential (primary) hypertension: Secondary | ICD-10-CM | POA: Diagnosis not present

## 2023-06-24 DIAGNOSIS — H3530 Unspecified macular degeneration: Secondary | ICD-10-CM | POA: Diagnosis not present

## 2023-06-24 DIAGNOSIS — F419 Anxiety disorder, unspecified: Secondary | ICD-10-CM | POA: Diagnosis not present

## 2023-06-24 DIAGNOSIS — I480 Paroxysmal atrial fibrillation: Secondary | ICD-10-CM | POA: Diagnosis not present

## 2023-06-24 DIAGNOSIS — I700 Atherosclerosis of aorta: Secondary | ICD-10-CM | POA: Diagnosis not present

## 2023-06-24 DIAGNOSIS — I2510 Atherosclerotic heart disease of native coronary artery without angina pectoris: Secondary | ICD-10-CM | POA: Diagnosis not present

## 2023-06-25 DIAGNOSIS — R338 Other retention of urine: Secondary | ICD-10-CM | POA: Diagnosis not present

## 2023-06-26 DIAGNOSIS — K3184 Gastroparesis: Secondary | ICD-10-CM | POA: Diagnosis not present

## 2023-06-26 DIAGNOSIS — I700 Atherosclerosis of aorta: Secondary | ICD-10-CM | POA: Diagnosis not present

## 2023-06-26 DIAGNOSIS — G4730 Sleep apnea, unspecified: Secondary | ICD-10-CM | POA: Diagnosis not present

## 2023-06-26 DIAGNOSIS — K219 Gastro-esophageal reflux disease without esophagitis: Secondary | ICD-10-CM | POA: Diagnosis not present

## 2023-06-26 DIAGNOSIS — R7303 Prediabetes: Secondary | ICD-10-CM | POA: Diagnosis not present

## 2023-06-26 DIAGNOSIS — Z85828 Personal history of other malignant neoplasm of skin: Secondary | ICD-10-CM | POA: Diagnosis not present

## 2023-06-26 DIAGNOSIS — F419 Anxiety disorder, unspecified: Secondary | ICD-10-CM | POA: Diagnosis not present

## 2023-06-26 DIAGNOSIS — Z7901 Long term (current) use of anticoagulants: Secondary | ICD-10-CM | POA: Diagnosis not present

## 2023-06-26 DIAGNOSIS — I2510 Atherosclerotic heart disease of native coronary artery without angina pectoris: Secondary | ICD-10-CM | POA: Diagnosis not present

## 2023-06-26 DIAGNOSIS — N400 Enlarged prostate without lower urinary tract symptoms: Secondary | ICD-10-CM | POA: Diagnosis not present

## 2023-06-26 DIAGNOSIS — H3530 Unspecified macular degeneration: Secondary | ICD-10-CM | POA: Diagnosis not present

## 2023-06-26 DIAGNOSIS — I480 Paroxysmal atrial fibrillation: Secondary | ICD-10-CM | POA: Diagnosis not present

## 2023-06-26 DIAGNOSIS — E538 Deficiency of other specified B group vitamins: Secondary | ICD-10-CM | POA: Diagnosis not present

## 2023-06-26 DIAGNOSIS — I10 Essential (primary) hypertension: Secondary | ICD-10-CM | POA: Diagnosis not present

## 2023-06-26 DIAGNOSIS — R911 Solitary pulmonary nodule: Secondary | ICD-10-CM | POA: Diagnosis not present

## 2023-06-26 DIAGNOSIS — Z87891 Personal history of nicotine dependence: Secondary | ICD-10-CM | POA: Diagnosis not present

## 2023-06-26 DIAGNOSIS — K5790 Diverticulosis of intestine, part unspecified, without perforation or abscess without bleeding: Secondary | ICD-10-CM | POA: Diagnosis not present

## 2023-06-28 ENCOUNTER — Ambulatory Visit: Payer: Medicare Other | Admitting: Physician Assistant

## 2023-07-01 ENCOUNTER — Telehealth: Payer: Self-pay | Admitting: Family Medicine

## 2023-07-01 NOTE — Telephone Encounter (Signed)
Pt would like a call back, he has Covid and wants to know if he can take Musinex. Please advise.

## 2023-07-01 NOTE — Telephone Encounter (Signed)
Called and informed patient's son of message below. I offered that since there aren't any appointments available, that he could do a virtual urgent care visit via Mychart. Caller verbalized understanding and stated they would do this.

## 2023-07-01 NOTE — Telephone Encounter (Signed)
Please schedule virtual visit with available provider to discuss.

## 2023-07-07 DIAGNOSIS — D485 Neoplasm of uncertain behavior of skin: Secondary | ICD-10-CM | POA: Diagnosis not present

## 2023-07-07 DIAGNOSIS — D0472 Carcinoma in situ of skin of left lower limb, including hip: Secondary | ICD-10-CM | POA: Diagnosis not present

## 2023-07-07 DIAGNOSIS — D0462 Carcinoma in situ of skin of left upper limb, including shoulder: Secondary | ICD-10-CM | POA: Diagnosis not present

## 2023-07-08 DIAGNOSIS — H3530 Unspecified macular degeneration: Secondary | ICD-10-CM | POA: Diagnosis not present

## 2023-07-08 DIAGNOSIS — I2510 Atherosclerotic heart disease of native coronary artery without angina pectoris: Secondary | ICD-10-CM | POA: Diagnosis not present

## 2023-07-08 DIAGNOSIS — I480 Paroxysmal atrial fibrillation: Secondary | ICD-10-CM | POA: Diagnosis not present

## 2023-07-08 DIAGNOSIS — I10 Essential (primary) hypertension: Secondary | ICD-10-CM | POA: Diagnosis not present

## 2023-07-08 DIAGNOSIS — I700 Atherosclerosis of aorta: Secondary | ICD-10-CM | POA: Diagnosis not present

## 2023-07-08 DIAGNOSIS — F419 Anxiety disorder, unspecified: Secondary | ICD-10-CM | POA: Diagnosis not present

## 2023-07-15 DIAGNOSIS — I10 Essential (primary) hypertension: Secondary | ICD-10-CM | POA: Diagnosis not present

## 2023-07-15 DIAGNOSIS — I700 Atherosclerosis of aorta: Secondary | ICD-10-CM | POA: Diagnosis not present

## 2023-07-15 DIAGNOSIS — F419 Anxiety disorder, unspecified: Secondary | ICD-10-CM | POA: Diagnosis not present

## 2023-07-15 DIAGNOSIS — I480 Paroxysmal atrial fibrillation: Secondary | ICD-10-CM | POA: Diagnosis not present

## 2023-07-15 DIAGNOSIS — H3530 Unspecified macular degeneration: Secondary | ICD-10-CM | POA: Diagnosis not present

## 2023-07-15 DIAGNOSIS — I2510 Atherosclerotic heart disease of native coronary artery without angina pectoris: Secondary | ICD-10-CM | POA: Diagnosis not present

## 2023-07-16 ENCOUNTER — Other Ambulatory Visit: Payer: Self-pay | Admitting: Family Medicine

## 2023-07-21 ENCOUNTER — Encounter (INDEPENDENT_AMBULATORY_CARE_PROVIDER_SITE_OTHER): Payer: Medicare Other | Admitting: Ophthalmology

## 2023-07-21 DIAGNOSIS — H43813 Vitreous degeneration, bilateral: Secondary | ICD-10-CM

## 2023-07-21 DIAGNOSIS — H33301 Unspecified retinal break, right eye: Secondary | ICD-10-CM | POA: Diagnosis not present

## 2023-07-21 DIAGNOSIS — I10 Essential (primary) hypertension: Secondary | ICD-10-CM

## 2023-07-21 DIAGNOSIS — H353231 Exudative age-related macular degeneration, bilateral, with active choroidal neovascularization: Secondary | ICD-10-CM

## 2023-07-21 DIAGNOSIS — D3132 Benign neoplasm of left choroid: Secondary | ICD-10-CM | POA: Diagnosis not present

## 2023-07-21 DIAGNOSIS — H35033 Hypertensive retinopathy, bilateral: Secondary | ICD-10-CM | POA: Diagnosis not present

## 2023-07-22 DIAGNOSIS — I700 Atherosclerosis of aorta: Secondary | ICD-10-CM | POA: Diagnosis not present

## 2023-07-22 DIAGNOSIS — I10 Essential (primary) hypertension: Secondary | ICD-10-CM | POA: Diagnosis not present

## 2023-07-22 DIAGNOSIS — F419 Anxiety disorder, unspecified: Secondary | ICD-10-CM | POA: Diagnosis not present

## 2023-07-22 DIAGNOSIS — I480 Paroxysmal atrial fibrillation: Secondary | ICD-10-CM | POA: Diagnosis not present

## 2023-07-22 DIAGNOSIS — H3530 Unspecified macular degeneration: Secondary | ICD-10-CM | POA: Diagnosis not present

## 2023-07-22 DIAGNOSIS — I2510 Atherosclerotic heart disease of native coronary artery without angina pectoris: Secondary | ICD-10-CM | POA: Diagnosis not present

## 2023-07-24 ENCOUNTER — Encounter (HOSPITAL_BASED_OUTPATIENT_CLINIC_OR_DEPARTMENT_OTHER): Payer: Self-pay | Admitting: Emergency Medicine

## 2023-07-24 ENCOUNTER — Emergency Department (HOSPITAL_BASED_OUTPATIENT_CLINIC_OR_DEPARTMENT_OTHER)
Admission: EM | Admit: 2023-07-24 | Discharge: 2023-07-25 | Disposition: A | Discharge status: Home / Self Care | Payer: Medicare Other | Attending: Emergency Medicine | Admitting: Emergency Medicine

## 2023-07-24 ENCOUNTER — Other Ambulatory Visit: Payer: Self-pay

## 2023-07-24 DIAGNOSIS — Z7901 Long term (current) use of anticoagulants: Secondary | ICD-10-CM | POA: Insufficient documentation

## 2023-07-24 DIAGNOSIS — N3001 Acute cystitis with hematuria: Secondary | ICD-10-CM | POA: Diagnosis not present

## 2023-07-24 DIAGNOSIS — R310 Gross hematuria: Secondary | ICD-10-CM

## 2023-07-24 DIAGNOSIS — I4891 Unspecified atrial fibrillation: Secondary | ICD-10-CM | POA: Insufficient documentation

## 2023-07-24 DIAGNOSIS — Z79899 Other long term (current) drug therapy: Secondary | ICD-10-CM | POA: Insufficient documentation

## 2023-07-24 DIAGNOSIS — D72829 Elevated white blood cell count, unspecified: Secondary | ICD-10-CM | POA: Insufficient documentation

## 2023-07-24 DIAGNOSIS — F0390 Unspecified dementia without behavioral disturbance: Secondary | ICD-10-CM | POA: Insufficient documentation

## 2023-07-24 DIAGNOSIS — I10 Essential (primary) hypertension: Secondary | ICD-10-CM | POA: Diagnosis not present

## 2023-07-24 LAB — URINALYSIS, W/ REFLEX TO CULTURE (INFECTION SUSPECTED)
Bilirubin Urine: NEGATIVE
Glucose, UA: NEGATIVE mg/dL
Ketones, ur: NEGATIVE mg/dL
Nitrite: NEGATIVE
RBC / HPF: >50 RBC/hpf (ref 0–5)
Specific Gravity, Urine: 1.012 (ref 1.005–1.030)
WBC, UA: >50 WBC/hpf (ref 0–5)
pH: 5.5 (ref 5.0–8.0)

## 2023-07-24 LAB — CBC WITH DIFFERENTIAL/PLATELET
Abs Immature Granulocytes: 0.04 10*3/uL (ref 0.00–0.07)
Basophils Absolute: 0 10*3/uL (ref 0.0–0.1)
Basophils Relative: 0 %
Eosinophils Absolute: 0 10*3/uL (ref 0.0–0.5)
Eosinophils Relative: 0 %
HCT: 47.8 % (ref 39.0–52.0)
Hemoglobin: 16.2 g/dL (ref 13.0–17.0)
Immature Granulocytes: 0 %
Lymphocytes Relative: 3 %
Lymphs Abs: 0.5 10*3/uL — ABNORMAL LOW (ref 0.7–4.0)
MCH: 30.7 pg (ref 26.0–34.0)
MCHC: 33.9 g/dL (ref 30.0–36.0)
MCV: 90.5 fL (ref 80.0–100.0)
Monocytes Absolute: 1.1 10*3/uL — ABNORMAL HIGH (ref 0.1–1.0)
Monocytes Relative: 8 %
Neutro Abs: 12.7 10*3/uL — ABNORMAL HIGH (ref 1.7–7.7)
Neutrophils Relative %: 89 %
Platelets: 194 10*3/uL (ref 150–400)
RBC: 5.28 MIL/uL (ref 4.22–5.81)
RDW: 13.5 % (ref 11.5–15.5)
WBC: 14.3 10*3/uL — ABNORMAL HIGH (ref 4.0–10.5)
nRBC: 0 % (ref 0.0–0.2)

## 2023-07-24 LAB — BASIC METABOLIC PANEL
Anion gap: 13 (ref 5–15)
BUN: 18 mg/dL (ref 8–23)
CO2: 21 mmol/L — ABNORMAL LOW (ref 22–32)
Calcium: 9.4 mg/dL (ref 8.9–10.3)
Chloride: 101 mmol/L (ref 98–111)
Creatinine, Ser: 0.75 mg/dL (ref 0.61–1.24)
GFR, Estimated: >60 mL/min (ref >60)
Glucose, Bld: 123 mg/dL — ABNORMAL HIGH (ref 70–99)
Potassium: 3.8 mmol/L (ref 3.5–5.1)
Sodium: 135 mmol/L (ref 135–145)

## 2023-07-24 MED ORDER — MORPHINE SULFATE (PF) 4 MG/ML IV SOLN
4.0000 mg | Freq: Once | INTRAVENOUS | Status: AC
  Administered 2023-07-24: 4 mg via INTRAVENOUS
  Filled 2023-07-24: qty 1

## 2023-07-24 MED ORDER — SODIUM CHLORIDE 0.9 % IV SOLN
3.0000 g | Freq: Once | INTRAVENOUS | Status: AC
  Administered 2023-07-25: 3 g via INTRAVENOUS

## 2023-07-24 MED ORDER — SODIUM CHLORIDE 0.9 % IR SOLN
3000.0000 mL | Status: DC
  Administered 2023-07-24: 3000 mL
  Filled 2023-07-24: qty 3000

## 2023-07-24 MED ORDER — SODIUM CHLORIDE 0.9 % IV SOLN: 1.0000 g | Freq: Once | INTRAVENOUS | Status: DC

## 2023-07-24 NOTE — ED Triage Notes (Signed)
Rountine Catheter changed today blood and clots noted and flushed. Now red blood in bag and pain. Feels like he has to pee

## 2023-07-24 NOTE — ED Provider Notes (Signed)
[place] EMERGENCY DEPARTMENT AT [facility] Provider Note   CSN: [id number] Arrival date & time: 07/24/23  2038     History  Chief Complaint  Patient presents with   foley problem    Joseph Simpson is a 88 y.o. male.  Patient is an 88 year old male with a past medical history of A-fib on Eliquis, dementia, hypertension with chronic Foley catheter in place presenting to the emergency department with hematuria and abdominal pain.  Patient is here with his son who states that family changed his Foley catheter yesterday for regular exchange.  He states that prior to changing the Foley he did have some blood-tinged urine but after changing the Foley he had bright red blood in his catheter.  He states that about 2 hours prior to arrival the patient started to develop severe abdominal pain.  Patient denies any fevers or chills, nausea or vomiting.  The history is provided by the patient and a relative. History limited by: Level 5 caveat for dementia.       Home Medications Prior to Admission medications   Medication Sig Start Date End Date Taking? Authorizing Provider  acetaminophen (TYLENOL) 500 MG tablet Take 500-1,000 mg by mouth 2 (two) times daily as needed for mild pain, moderate pain or headache.    [provider]  apixaban (ELIQUIS) 5 MG TABS tablet TAKE 1 TABLET BY MOUTH TWICE A DAY 03/15/23   Swaziland, Peter M, MD  atorvastatin (LIPITOR) 20 MG tablet TAKE ONE TABLET BY MOUTH THREE TIMES A WEEK 11/12/22   Shelva Majestic, MD  BENADRYL ALLERGY 25 MG tablet Take 12.5 mg by mouth See admin instructions. Take 12.5 mg by mouth every 8 hours when taking Prochlorperazine    [provider]  bevacizumab (AVASTIN) 2.5 mg/0.1 mL SOLN 2.5 mg by Intravitreal route See admin instructions. Injection to eyes every 4 weeks - use with vigamox    [provider]  busPIRone (BUSPAR) 5 MG tablet Take 1 tablet (5 mg total) by mouth 3 (three) times daily. 05/17/23    Shelva Majestic, MD  Cholecalciferol (VITAMIN D3) 1.25 MG (50000 UT) CAPS Take 1 capsule by mouth once a week. 11/22/22   [provider]  ciprofloxacin (CIPRO) 500 MG tablet Take 500 mg by mouth 2 (two) times daily. 12/30/22   [provider]  cyanocobalamin (VITAMIN B12) 1000 MCG/ML injection Inject 1 mL (1,000 mcg total) into the muscle every 30 (thirty) days. By granddaughter who is an RN 01/04/23   Shelva Majestic, MD  docusate sodium (COLACE) 100 MG capsule Take 200 mg by mouth at bedtime.    [provider]  LORazepam (ATIVAN) 0.5 MG tablet Take 1-2 tablets (0.5-1 mg total) by mouth daily as needed for anxiety (needs to be supervised 8 hours after use- increases fall risk). 05/17/23   Shelva Majestic, MD  losartan (COZAAR) 25 MG tablet Take 0.5-1 tablets (12.5-25 mg total) by mouth daily. If blood pressure over 145 when you check- ok to take other half tablet once per day (so total of 25mg  per day) Patient taking differently: Take 12.5 mg by mouth See admin instructions. Take 12.5 mg by mouth once a day and if Systolic B/P number is over 145 when checked, may take an additional 12.5 mg once a day (max total of 25 mg/day) 09/21/22   Shelva Majestic, MD  metoprolol tartrate (LOPRESSOR) 25 MG tablet TAKE 0.5 TABLETS BY MOUTH 2 TIMES DAILY. 01/29/23   Swaziland,  Demetria Pore, MD  pantoprazole (PROTONIX) 40 MG tablet Take 1 tablet (40 mg total) by mouth 2 (two) times daily. 11/03/22 02/01/23  Lorin Glass, MD  polyethylene glycol (MIRALAX / GLYCOLAX) 17 g packet Take 17 g by mouth daily as needed for moderate constipation (mix and drink as directed).    [provider]  prochlorperazine (COMPAZINE) 10 MG tablet Take 1 tablet (10 mg total) by mouth every 8 (eight) hours as needed for nausea or vomiting (take with 12.5mg  benadryl please). 10/27/22   Small, Brooke L, PA  SYSTANE BALANCE 0.6 % SOLN Apply to eye. 11/11/22   [provider]  tamsulosin (FLOMAX) 0.4 MG  CAPS capsule Take 0.4 mg by mouth daily. 12/30/21   [provider]  THERATEARS NIGHTTIME 1 % GEL Apply to eye. 11/11/22   [provider]  thiamine (VITAMIN B1) 100 MG tablet Take 1 tablet (100 mg total) by mouth daily. 11/10/22   Amin, Ankit C, MD  VIGAMOX 0.5 % ophthalmic solution Place 1 drop into both eyes See admin instructions. Instill every 4 weeks as directed - use with avastin, instill 1 drop into both eyes 4 times daily for 2 days after eye injections 05/28/14   [provider]      Allergies    Tape, Aspirin, Percocet [oxycodone-acetaminophen], and Vitamins [apatate]    Review of Systems   Review of Systems  Physical Exam Updated Vital Signs BP 131/74   Pulse (!) 105   Temp 97.6 F (36.4 C)   Resp 18   SpO2 93%  Physical Exam Vitals and nursing note reviewed.  Constitutional:      General: He is not in acute distress.    Appearance: Normal appearance.  HENT:     Head: Normocephalic and atraumatic.     Nose: Nose normal.     Mouth/Throat:     Mouth: Mucous membranes are moist.     Pharynx: Oropharynx is clear.  Eyes:     Extraocular Movements: Extraocular movements intact.     Conjunctiva/sclera: Conjunctivae normal.  Cardiovascular:     Rate and Rhythm: Normal rate and regular rhythm.  Pulmonary:     Effort: Pulmonary effort is normal.     Breath sounds: Normal breath sounds.  Abdominal:     General: Abdomen is flat.     Palpations: Abdomen is soft.     Tenderness: There is abdominal tenderness (Suprapubic). There is guarding. There is no rebound.  Genitourinary:    Comments: Foley catheter in place with bright red blood and large clots seen within catheter tubing Musculoskeletal:        General: Normal range of motion.     Cervical back: Normal range of motion.  Skin:    General: Skin is warm and dry.  Neurological:     General: No focal deficit present.     Mental Status: He is alert. Mental status is at baseline.   Psychiatric:        Mood and Affect: Mood normal.        Behavior: Behavior normal.     ED Results / Procedures / Treatments   Labs (all labs ordered are listed, but only abnormal results are displayed) Labs Reviewed  CBC WITH DIFFERENTIAL/PLATELET - Abnormal; Notable for the following components:      Result Value   WBC 14.3 (*)    Neutro Abs 12.7 (*)    Lymphs Abs 0.5 (*)    Monocytes Absolute 1.1 (*)  All other components within normal limits  BASIC METABOLIC PANEL - Abnormal; Notable for the following components:   CO2 21 (*)    Glucose, Bld 123 (*)    All other components within normal limits  URINALYSIS, W/ REFLEX TO CULTURE (INFECTION SUSPECTED) - Abnormal; Notable for the following components:   Color, Urine BROWN (*)    APPearance CLOUDY (*)    Hgb urine dipstick LARGE (*)    Protein, ur TRACE (*)    Leukocytes,Ua MODERATE (*)    Bacteria, UA MANY (*)    All other components within normal limits  URINE CULTURE    EKG None  Radiology No results found.  Procedures Procedures    Medications Ordered in ED Medications  sodium chloride irrigation 0.9 % 3,000 mL (has no administration in time range)  Ampicillin-Sulbactam (UNASYN) 3 g in sodium chloride 0.9 % 100 mL IVPB (has no administration in time range)  morphine (PF) 4 MG/ML injection 4 mg (4 mg Intravenous Given 07/24/23 2212)    ED Course/ Medical Decision Making/ A&P Clinical Course as of 07/24/23 2325  Sat Jul 24, 2023  2254 Patient's foley has been changed with significant improvement of his pain. Has not yet started bladder irrigation. [VK]  2307 Many bacteria with moderate leuks on UA. Will be treated with abx [VK]  2324 Bladder irrigated, initially clear return but after stopping irritation bright red blood return. Will resume bladder irrigation. Patient signed out to Dr. Wallace Cullens pending reassessment after bladder irrigation. [VK]    Clinical Course User Index [VK] Rexford Maus, DO                                  Medical Decision Making This patient presents to the ED with chief complaint(s) of hematuria, abdominal pain with pertinent past medical history of chronic Foley catheter, dementia, A-fib on Eliquis, hypertension which further complicates the presenting complaint. The complaint involves an extensive differential diagnosis and also carries with it a high risk of complications and morbidity.    The differential diagnosis includes Foley obstruction, UTI, anemia, coagulopathy, displaced Foley  Additional history obtained: Additional history obtained from family Records reviewed Primary Care Documents  ED Course and Reassessment: On patient's arrival he was uncomfortable appearing and mildly tachycardic with significant suprapubic tenderness and obvious gross blood and clots in his Foley concerning for Foley catheter obstruction.  Patient will require Foley catheter exchange with a three-way Foley and will need bladder irrigation to overcome the obstruction.  Patient will have labs to evaluate for anemia, infection or coagulopathy.  He will be given pain control and will be closely reassessed.  Independent labs interpretation:  The following labs were independently interpreted: Mild leukocytosis, UA concerning for UTI  Independent visualization of imaging: - N/A     Amount and/or Complexity of Data Reviewed Labs: ordered.  Risk Prescription drug management.          Final Clinical Impression(s) / ED Diagnoses Final diagnoses:  Gross hematuria  Acute cystitis with hematuria    Rx / DC Orders ED Discharge Orders     None         Rexford Maus, DO 07/24/23 2325

## 2023-07-24 NOTE — ED Provider Notes (Signed)
  Provider Note MRN:  [id number]  Arrival date & time: 07/25/23    ED Course and Medical Decision Making  Assumed care from Dr Theresia Lo at shift change.  See note from prior team for complete details, in brief:  88 yo male Indwelling foley Clots in foley Irrigated and was clear  CBI On eliquis UTI  Plan per prior physician f/u labs/irrigation  CBI completed, Foley draining mostly clear urine.  He is tolerating catheter well.  Hemoglobin stable.  Feeling well otherwise.  Will keep three-way catheter and, continue home medications, follow-up with urology for further management.  Antibiotics for UTI per prior team  Prior urine ctx with ESBL sensitive to unasyn  hemorrhagic cystitis   Hgb stable, bleeding appears to have resolved   The patient improved significantly and was discharged in stable condition. Detailed discussions were had with the patient regarding current findings, and need for close f/u with PCP or on call doctor. The patient has been instructed to return immediately if the symptoms worsen in any way for re-evaluation. Patient verbalized understanding and is in agreement with current care plan. All questions answered prior to discharge.    Procedures  Final Clinical Impressions(s) / ED Diagnoses     ICD-10-CM   1. Gross hematuria  R31.0     2. Acute cystitis with hematuria  N30.01       ED Discharge Orders          Ordered    amoxicillin-clavulanate (AUGMENTIN) 875-125 MG tablet  Every 12 hours        07/25/23 0241              Discharge Instructions      Please call urology on Monday to arrange follow-up appointment in the next week  It was a pleasure caring for you today in the emergency department.  Please return to the emergency department for any worsening or worrisome symptoms.          Sloan Leiter, DO 07/25/23 [phone]

## 2023-07-25 DIAGNOSIS — N3001 Acute cystitis with hematuria: Secondary | ICD-10-CM | POA: Diagnosis not present

## 2023-07-25 MED ORDER — AMOXICILLIN-POT CLAVULANATE 875-125 MG PO TABS: 1.0000 | ORAL_TABLET | Freq: Two times a day (BID) | ORAL | 0 refills | Status: DC

## 2023-07-25 NOTE — Discharge Instructions (Addendum)
Please call urology on Monday to arrange follow-up appointment in the next week  It was a pleasure caring for you today in the emergency department.  Please return to the emergency department for any worsening or worrisome symptoms.

## 2023-07-25 NOTE — ED Notes (Signed)
Pt bladder was irrigated 2x with 9% NS. Pt had output of greater then with each bag. Fluid return was clear at the end of the each bag. Pt had no complaints of pain or any concerns. Provider was notified when both fluids were finished being irrigated.

## 2023-07-28 ENCOUNTER — Telehealth: Payer: Self-pay | Admitting: Family Medicine

## 2023-07-28 LAB — URINE CULTURE: Culture: >=100000 — AB

## 2023-07-28 NOTE — Telephone Encounter (Signed)
Patient dropped off document Home Health Certificate (Order ID [id number]), to be filled out by provider. Patient requested to send it back via Fax within 5-days. Document is located in providers tray at front office.Please advise

## 2023-07-29 ENCOUNTER — Telehealth (HOSPITAL_BASED_OUTPATIENT_CLINIC_OR_DEPARTMENT_OTHER): Payer: Self-pay | Admitting: *Deleted

## 2023-07-29 NOTE — Telephone Encounter (Signed)
Post ED Visit - Positive Culture Follow-up: Unsuccessful Patient Follow-up  Culture assessed and recommendations reviewed by:  []  Enzo Bi, Pharm.D. []  Celedonio Miyamoto, Pharm.D., BCPS AQ-ID []  Garvin Fila, Pharm.D., BCPS []  Georgina Pillion, Pharm.D., BCPS []  [place], [place].D., BCPS, AAHIVP []  Estella Husk, Pharm.D., BCPS, AAHIVP []  Sherlynn Carbon, PharmD []  Pollyann Samples, PharmD, BCPS  Positive urine culture  []  Patient discharged without antimicrobial prescription and treatment is now indicated []  Organism is resistant to prescribed ED discharge antimicrobial []  Patient with positive blood cultures  Plan:  Stop Augmentin due to resistance.  If UTI symptoms present, Start Macrobid 100mg  PO BID x 5 days.  Jacalyn Lefevre, MC   Unable to contact patient after 3 attempts, letter will be sent to address on file  Lysle Pearl 07/29/2023, 9:22 AM

## 2023-07-29 NOTE — Progress Notes (Addendum)
ED Antimicrobial Stewardship Positive Culture Follow Up   Joseph Simpson is an 88 y.o. male who presented to [facility] on 07/24/2023 with a chief complaint of  Chief Complaint  Patient presents with   foley problem    Recent Results (from the past 720 hour(s))  Urine Culture     Status: Abnormal   Collection Time: 07/24/23 10:14 PM   Specimen: Urine, Random  Result Value Ref Range Status   Specimen Description   Final    URINE, RANDOM Performed at [facility], [address], [place], [place] [address]    Special Requests   Final    NONE Reflexed from [id number] Performed at [facility], [address], [place], [place] [address]    Culture >=100,000 COLONIES/mL CITROBACTER FREUNDII (A)  Final   Report Status 07/28/2023 FINAL  Final   Organism ID, Bacteria CITROBACTER FREUNDII (A)  Final      Susceptibility   Citrobacter freundii - MIC*    CEFEPIME <=0.12 SENSITIVE Sensitive     CEFTRIAXONE 0.5 SENSITIVE Sensitive     CIPROFLOXACIN <=0.25 SENSITIVE Sensitive     GENTAMICIN <=1 SENSITIVE Sensitive     IMIPENEM 0.5 SENSITIVE Sensitive     NITROFURANTOIN <=16 SENSITIVE Sensitive     TRIMETH/SULFA <=20 SENSITIVE Sensitive     PIP/TAZO <=4 SENSITIVE Sensitive     * >=100,000 COLONIES/mL CITROBACTER FREUNDII    [x]  Treated with Augmentin, organism resistant to prescribed antimicrobial  Plan: Stop Augmentin. Have RN call patient for a symptom check. If urinary symptoms present, start macrobid 100mg  twice daily x5d.   ED Provider: Jacalyn Lefevre, MD   [name] 07/29/2023, 8:43 AM Clinical Pharmacist Monday - Friday phone -  [phone] Saturday - Sunday phone - [phone]

## 2023-07-30 ENCOUNTER — Other Ambulatory Visit: Payer: Self-pay

## 2023-07-30 ENCOUNTER — Encounter: Payer: Self-pay | Admitting: Family Medicine

## 2023-07-30 MED ORDER — PANTOPRAZOLE SODIUM 40 MG PO TBEC: 40.0000 mg | DELAYED_RELEASE_TABLET | Freq: Two times a day (BID) | ORAL | 2 refills | Status: DC

## 2023-07-30 NOTE — Telephone Encounter (Signed)
Form completed and faxed back

## 2023-08-02 ENCOUNTER — Other Ambulatory Visit: Payer: Self-pay | Admitting: Family Medicine

## 2023-08-02 DIAGNOSIS — R8271 Bacteriuria: Secondary | ICD-10-CM | POA: Diagnosis not present

## 2023-08-02 DIAGNOSIS — R300 Dysuria: Secondary | ICD-10-CM | POA: Diagnosis not present

## 2023-08-02 DIAGNOSIS — N210 Calculus in bladder: Secondary | ICD-10-CM | POA: Diagnosis not present

## 2023-08-02 MED ORDER — LORAZEPAM 1 MG PO TABS: 1.0000 mg | ORAL_TABLET | Freq: Two times a day (BID) | ORAL | 2 refills | Status: DC | PRN

## 2023-08-10 DIAGNOSIS — R338 Other retention of urine: Secondary | ICD-10-CM | POA: Diagnosis not present

## 2023-08-18 DIAGNOSIS — B351 Tinea unguium: Secondary | ICD-10-CM | POA: Diagnosis not present

## 2023-08-18 DIAGNOSIS — I739 Peripheral vascular disease, unspecified: Secondary | ICD-10-CM | POA: Diagnosis not present

## 2023-08-18 DIAGNOSIS — M792 Neuralgia and neuritis, unspecified: Secondary | ICD-10-CM | POA: Diagnosis not present

## 2023-08-23 ENCOUNTER — Telehealth: Payer: Self-pay

## 2023-08-23 NOTE — Telephone Encounter (Signed)
Transition Care Management Follow-up Telephone Call Date of discharge and from where: [facility]/ How have you been since you were released from the hospital? Doing fine he has been following up with urology  Any questions or concerns? No  Items Reviewed: Did the pt receive and understand the discharge instructions provided? Yes  Medications obtained and verified? Yes  Other? No  Any new allergies since your discharge? No  Dietary orders reviewed? No Do you have support at home? Yes     Follow up appointments reviewed:  PCP Hospital f/u appt confirmed? Yes  Scheduled to see  on  @ . Specialist Hospital f/u appt confirmed? Yes  Scheduled to see  on  @  Are transportation arrangements needed? No  If their condition worsens, is the pt aware to call PCP or go to the Emergency Dept.? Yes Was the patient provided with contact information for the PCP's office or ED? Yes Was to pt encouraged to call back with questions or concerns? Yes

## 2023-08-24 DIAGNOSIS — D0472 Carcinoma in situ of skin of left lower limb, including hip: Secondary | ICD-10-CM | POA: Diagnosis not present

## 2023-09-01 DIAGNOSIS — R338 Other retention of urine: Secondary | ICD-10-CM | POA: Diagnosis not present

## 2023-09-08 ENCOUNTER — Encounter (INDEPENDENT_AMBULATORY_CARE_PROVIDER_SITE_OTHER): Payer: Medicare Other | Admitting: Ophthalmology

## 2023-09-08 DIAGNOSIS — I10 Essential (primary) hypertension: Secondary | ICD-10-CM | POA: Diagnosis not present

## 2023-09-08 DIAGNOSIS — H35033 Hypertensive retinopathy, bilateral: Secondary | ICD-10-CM | POA: Diagnosis not present

## 2023-09-08 DIAGNOSIS — H353231 Exudative age-related macular degeneration, bilateral, with active choroidal neovascularization: Secondary | ICD-10-CM | POA: Diagnosis not present

## 2023-09-08 DIAGNOSIS — D3132 Benign neoplasm of left choroid: Secondary | ICD-10-CM

## 2023-09-08 DIAGNOSIS — H43813 Vitreous degeneration, bilateral: Secondary | ICD-10-CM

## 2023-09-08 DIAGNOSIS — H33301 Unspecified retinal break, right eye: Secondary | ICD-10-CM | POA: Diagnosis not present

## 2023-09-19 ENCOUNTER — Other Ambulatory Visit: Payer: Self-pay | Admitting: Cardiology

## 2023-09-24 ENCOUNTER — Encounter: Payer: Self-pay | Admitting: Family Medicine

## 2023-09-24 ENCOUNTER — Ambulatory Visit (INDEPENDENT_AMBULATORY_CARE_PROVIDER_SITE_OTHER): Payer: Medicare Other | Admitting: Family Medicine

## 2023-09-24 VITALS — BP 126/86 | HR 79 | Temp 98.6°F | Ht 72.0 in | Wt 169.6 lb

## 2023-09-24 DIAGNOSIS — I2510 Atherosclerotic heart disease of native coronary artery without angina pectoris: Secondary | ICD-10-CM

## 2023-09-24 DIAGNOSIS — Z23 Encounter for immunization: Secondary | ICD-10-CM

## 2023-09-24 DIAGNOSIS — E538 Deficiency of other specified B group vitamins: Secondary | ICD-10-CM

## 2023-09-24 DIAGNOSIS — I480 Paroxysmal atrial fibrillation: Secondary | ICD-10-CM | POA: Diagnosis not present

## 2023-09-24 DIAGNOSIS — Z131 Encounter for screening for diabetes mellitus: Secondary | ICD-10-CM | POA: Diagnosis not present

## 2023-09-24 DIAGNOSIS — I10 Essential (primary) hypertension: Secondary | ICD-10-CM | POA: Diagnosis not present

## 2023-09-24 DIAGNOSIS — R7303 Prediabetes: Secondary | ICD-10-CM | POA: Diagnosis not present

## 2023-09-24 LAB — CBC WITH DIFFERENTIAL/PLATELET
Basophils Absolute: 0 10*3/uL (ref 0.0–0.1)
Basophils Relative: 0.4 % (ref 0.0–3.0)
Eosinophils Absolute: 0.1 10*3/uL (ref 0.0–0.7)
Eosinophils Relative: 1.7 % (ref 0.0–5.0)
HCT: 49.3 % (ref 39.0–52.0)
Hemoglobin: 16.4 g/dL (ref 13.0–17.0)
Lymphocytes Relative: 12.2 % (ref 12.0–46.0)
Lymphs Abs: 1 10*3/uL (ref 0.7–4.0)
MCHC: 33.3 g/dL (ref 30.0–36.0)
MCV: 93.2 fL (ref 78.0–100.0)
Monocytes Absolute: 0.5 10*3/uL (ref 0.1–1.0)
Monocytes Relative: 6.8 % (ref 3.0–12.0)
Neutro Abs: 6.2 10*3/uL (ref 1.4–7.7)
Neutrophils Relative %: 78.9 % — ABNORMAL HIGH (ref 43.0–77.0)
Platelets: 154 10*3/uL (ref 150.0–400.0)
RBC: 5.29 Mil/uL (ref 4.22–5.81)
RDW: 13.4 % (ref 11.5–15.5)
WBC: 7.9 10*3/uL (ref 4.0–10.5)

## 2023-09-24 LAB — COMPREHENSIVE METABOLIC PANEL
ALT: 18 U/L (ref 0–53)
AST: 20 U/L (ref 0–37)
Albumin: 4.3 g/dL (ref 3.5–5.2)
Alkaline Phosphatase: 97 U/L (ref 39–117)
BUN: 15 mg/dL (ref 6–23)
CO2: 31 meq/L (ref 19–32)
Calcium: 9.5 mg/dL (ref 8.4–10.5)
Chloride: 101 meq/L (ref 96–112)
Creatinine, Ser: 0.96 mg/dL (ref 0.40–1.50)
GFR: 71.4 mL/min (ref >60.00)
Glucose, Bld: 102 mg/dL — ABNORMAL HIGH (ref 70–99)
Potassium: 4.2 meq/L (ref 3.5–5.1)
Sodium: 139 meq/L (ref 135–145)
Total Bilirubin: 1.8 mg/dL — ABNORMAL HIGH (ref 0.2–1.2)
Total Protein: 7.8 g/dL (ref 6.0–8.3)

## 2023-09-24 LAB — HEMOGLOBIN A1C: Hgb A1c MFr Bld: 6 % (ref 4.6–6.5)

## 2023-09-24 MED ORDER — METOPROLOL TARTRATE 25 MG PO TABS: ORAL_TABLET | ORAL | 3 refills | Status: DC

## 2023-09-24 MED ORDER — APIXABAN 5 MG PO TABS: 5.0000 mg | ORAL_TABLET | Freq: Two times a day (BID) | ORAL | 3 refills | Status: DC

## 2023-09-24 NOTE — Patient Instructions (Addendum)
Please stop by lab before you go If you have mychart- we will send your results within 3 business days of [place] receiving them.  If you do not have mychart- we will call you about results within 5 business days of [place] receiving them.  *please also note that you will see labs on mychart as soon as they post. I will later go in and write notes on them- will say "notes from Dr. Durene Cal"   No changes today unless labs lead [place] to make changes - though I did take over eliquis and metoprolol today  Recommended follow up: Return in about 6 months (around 03/23/2024) for followup or sooner if needed.Schedule b4 you leave.

## 2023-09-24 NOTE — Progress Notes (Signed)
Phone [phone] In person visit   Subjective:   Joseph Simpson is a 87 y.o. year old very pleasant male patient who presents for/with See problem oriented charting Chief Complaint  Patient presents with   Anxiety   Hypertension   Hyperglycemia   Dementia   Past Medical History-  Patient Active Problem List   Diagnosis Date Noted   GI bleed 01/07/2022    Priority: High   Paroxysmal atrial fibrillation (HCC)     Priority: High   CAD (coronary artery disease)     Priority: High   Macular degeneration 11/17/2011    Priority: High   Anxiety 05/17/2023    Priority: Medium    Hyperglycemia 12/21/2019    Priority: Medium    B12 deficiency 03/09/2019    Priority: Medium    Gastroparesis 02/15/2017    Priority: Medium    Hx of adenomatous colonic polyps 02/15/2017    Priority: Medium    Hyperlipidemia 06/10/2016    Priority: Medium    BPH (benign prostatic hyperplasia) 11/23/2014    Priority: Medium    Palpitations 07/31/2014    Priority: Medium    Dyspnea 02/27/2014    Priority: Medium    Essential hypertension 09/23/2007    Priority: Medium    GERD 09/23/2007    Priority: Medium    Osteoarthritis of left hip 01/30/2019    Priority: Low   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 12/29/2017    Priority: Low   Left inguinal hernia 07/12/2016    Priority: Low   PAC (premature atrial contraction) 10/25/2015    Priority: Low   Squamous cell carcinoma in situ 09/09/2015    Priority: Low   Cough 05/10/2014    Priority: Low   Pulmonary nodule 05/10/2014    Priority: Low   Murmur 02/10/2012    Priority: Low   Left wrist pain 10/01/2021    Priority: 1.   Bilateral foot pain 07/04/2021    Priority: 1.   S/P hip replacement, left 02/11/2021    Priority: 1.   Delirium 11/06/2022   Altered mental status 11/05/2022   Abdominal pain, epigastric 11/02/2022   Intractable nausea and vomiting 10/30/2022   Gastritis and duodenitis 10/29/2022     Medications- reviewed and updated Current Outpatient Medications  Medication Sig Dispense Refill   acetaminophen (TYLENOL) 500 MG tablet Take 500-1,000 mg by mouth 2 (two) times daily as needed for mild pain, moderate pain or headache.     atorvastatin (LIPITOR) 20 MG tablet TAKE ONE TABLET BY MOUTH THREE TIMES A WEEK 36 tablet 4   BENADRYL ALLERGY 25 MG tablet Take 12.5 mg by mouth See admin instructions. Take 12.5 mg by mouth every 8 hours when taking Prochlorperazine     bevacizumab (AVASTIN) 2.5 mg/0.1 mL SOLN 2.5 mg by Intravitreal route See admin instructions. Injection to eyes every 4 weeks - use with vigamox     busPIRone (BUSPAR) 5 MG tablet TAKE 1 TABLET BY MOUTH THREE TIMES A DAY 270 tablet 2   Cholecalciferol (VITAMIN D3) 1.25 MG (50000 UT) CAPS Take 1 capsule by mouth once a week.     cyanocobalamin (VITAMIN B12) 1000 MCG/ML injection Inject 1 mL (1,000 mcg total) into the muscle every 30 (thirty) days. By granddaughter who is an RN 3 mL 3   docusate sodium (COLACE) 100 MG capsule Take 200 mg by mouth at bedtime.     LORazepam (ATIVAN) 1 MG tablet Take 1 tablet (1 mg total) by mouth 2 (  two) times daily as needed for anxiety (needs to be supervised 8-10 hours after use- increases fall risk). 60 tablet 2   losartan (COZAAR) 25 MG tablet Take 0.5-1 tablets (12.5-25 mg total) by mouth daily. If blood pressure over 145 when you check- ok to take other half tablet once per day (so total of 25mg  per day) (Patient taking differently: Take 12.5 mg by mouth See admin instructions. Take 12.5 mg by mouth once a day and if Systolic B/P number is over 145 when checked, may take an additional 12.5 mg once a day (max total of 25 mg/day)) 135 tablet 3   pantoprazole (PROTONIX) 40 MG tablet TAKE 1 TABLET BY MOUTH TWICE A DAY 180 tablet 3   polyethylene glycol (MIRALAX / GLYCOLAX) 17 g packet Take 17 g by mouth daily as needed for moderate constipation (mix and drink as directed).     prochlorperazine  (COMPAZINE) 10 MG tablet Take 1 tablet (10 mg total) by mouth every 8 (eight) hours as needed for nausea or vomiting (take with 12.5mg  benadryl please). 10 tablet 0   SYSTANE BALANCE 0.6 % SOLN Apply to eye.     THERATEARS NIGHTTIME 1 % GEL Apply to eye.     thiamine (VITAMIN B1) 100 MG tablet Take 1 tablet (100 mg total) by mouth daily.     VIGAMOX 0.5 % ophthalmic solution Place 1 drop into both eyes See admin instructions. Instill every 4 weeks as directed - use with avastin, instill 1 drop into both eyes 4 times daily for 2 days after eye injections     apixaban (ELIQUIS) 5 MG TABS tablet Take 1 tablet (5 mg total) by mouth 2 (two) times daily. 180 tablet 3   ciprofloxacin (CIPRO) 500 MG tablet Take 500 mg by mouth 2 (two) times daily. (Patient not taking: Reported on 09/24/2023)     metoprolol tartrate (LOPRESSOR) 25 MG tablet TAKE 1/2 TABLET TWICE A DAY BY MOUTH 90 tablet 3   tamsulosin (FLOMAX) 0.4 MG CAPS capsule Take 0.4 mg by mouth daily. (Patient not taking: Reported on 09/24/2023)     No current facility-administered medications for this visit.     Objective:  BP 126/86   Pulse 79   Temp 98.6 F (37 C)   Ht 6' (1.829 m)   Wt 169 lb 9.6 oz (76.9 kg)   SpO2 93%   BMI 23.00 kg/m  Gen: NAD, resting comfortably CV: irregularly irregular  Lungs: CTAB no crackles, wheeze, rhonchi Skin: warm, dry Neuro: Walks with walker with Foley bag on    Assessment and Plan   #granddaughter with him today  #permanent foley plan- changed by nursing q3 weeks through urology due to obstruction.   -Foley catheter obstruction 07/24/2023 and seen in the emergency department- no issues with more regular changes isnce that time.   #Frontotemporal dementia- new diagnosis based on PET 01/28/23 #  prior ? Wernicke's encephalopathy/memory loss S: 2 admissions in December 2023.  Started on thiamine 100 mg every other day.  He is followed by neurology.  Ongoing mild decline.  -Now at home with 24/7  family support -walks with walker - memantine with side effects  -avoiding donepazil due to a fib history A/P: ongoing issues - doesn't tolerate medicine and family has decided to hold off on further follow up with neurology   # Anxiety S: reports doing better since AST visti -Now on buspirone 3 times a day after 05/17/2023 visit -Ativan certainly with risks but with level  of anxiety prevents emergency department visits-1 mg twice daily of Ativan- in general this has been helpful- has still had some tougher nights but much less frequent A/P: anxiety improved- he and family are aware of risks but we really want to improve quality of life for him- more palliative approach -he is never alone in the home   #Paroxysmal atrial fibrillation-follows with Dr. Sw[place] S: Anticoagulation: Eliquis 5 mg twice daily  -(Does have gastric ulceration history as well as partial removal of stomach (one third) and previously avoided aspirin with CAD) but no obvious bleeding issues -Rate control: Metoprolol 12.5 mg twice daily   A/P: Dr. Swaziland has retired and I will take over the eliquis and the metoprolol prescription- appropriately anticoagulated and rate controlled- continue current medicine   #Hypertension  S: Controlled on losartan 12.5 mg daily (with extra dose 12.5 mg if blood pressure above 145), metoprolol 12.5 mg twice daily (also helps palpitations- reduced from full tablet due to bradycardia 06/2021) BP Readings from Last 3 Encounters:  09/24/23 126/86  07/25/23 (!) 157/84  05/25/23 (!) 144/103  A/P: stable- continue current medicines      #Hyperlipidemia/CAD-follows with Dr. Sw[place]and  S: Patient has had minimally obstructive CAD on cath 2013.  Medication: Eliquis 5 mg twice daily (cannot use aspirin with prior one third removal of stomach) -Atorvastatin 20 mg 3 times a week Lab Results  Component Value Date   CHOL 86 03/22/2023   HDL 36.70 (L) 03/22/2023   LDLCALC 35 03/22/2023   LDLDIRECT 83  06/19/2020   TRIG 76.0 03/22/2023   CHOLHDL 2 03/22/2023  A/P: cholesterol stable- continue current medicines    CAD- denies chest pain or shortness of breath- continue current medications   %GERD-sees Dr. Adela Lank (prior Dr. Rhetta Mura in [place]) S: Patient is compliant with high-dose PPI-pantoprazole 40 mg twice a day per  GI.  Also on Reglan. -Also on Reglan in past but was stopped by rehab February 2024 and no worsening GI issues A/P: doing reasonably well- continue current medications   # Hyperglycemia/insulin resistance/prediabetes- peak a1c 6.1 S:  Medication:none Lab Results  Component Value Date   HGBA1C 6.1 03/22/2023   HGBA1C 5.7 09/21/2022   HGBA1C 5.8 01/27/2022   A/P: hopefully stable- update a1c today. Continue without meds for now   # B12 deficiency S: Current treatment/medication (oral vs. IM): Monthly B12 injections at home by granddaughter Lab Results  Component Value Date   VITAMINB12 564 11/20/2022  A/P: stable- continue current medicines- check next visit   Recommended follow up: Return in about 6 months (around 03/23/2024) for followup or sooner if needed.Schedule b4 you leave. Future Appointments  Date Time Provider Department Center  10/27/2023  8:20 AM Sherrie George, MD [facility] None    Lab/Order associations:   ICD-10-CM   1. Paroxysmal atrial fibrillation (HCC)  I48.0 apixaban (ELIQUIS) 5 MG TABS tablet    2. Coronary artery disease involving native coronary artery of native heart without angina pectoris  I25.10     3. B12 deficiency  E53.8     4. Essential hypertension  I10 Comprehensive metabolic panel    CBC with Differential/Platelet    5. Prediabetes  R73.03 Hemoglobin A1c    6. Screening for diabetes mellitus  Z13.1 Hemoglobin A1c    7. Need for influenza vaccination  Z23 Flu Vaccine Trivalent High Dose (Fluad)      Meds ordered this encounter  Medications   apixaban (ELIQUIS) 5 MG TABS tablet    Sig:  Take 1 tablet (5 mg  total) by mouth 2 (two) times daily.    Dispense:  180 tablet    Refill:  3   metoprolol tartrate (LOPRESSOR) 25 MG tablet    Sig: TAKE 1/2 TABLET TWICE A DAY BY MOUTH    Dispense:  90 tablet    Refill:  3    Return precautions advised.  Tana Conch, MD

## 2023-10-04 ENCOUNTER — Encounter: Payer: Medicare Other | Admitting: Pharmacist

## 2023-10-04 DIAGNOSIS — R338 Other retention of urine: Secondary | ICD-10-CM | POA: Diagnosis not present

## 2023-10-27 ENCOUNTER — Encounter (INDEPENDENT_AMBULATORY_CARE_PROVIDER_SITE_OTHER): Payer: Medicare Other | Admitting: Ophthalmology

## 2023-10-27 DIAGNOSIS — D2230 Melanocytic nevi of unspecified part of face: Secondary | ICD-10-CM | POA: Diagnosis not present

## 2023-10-27 DIAGNOSIS — H353231 Exudative age-related macular degeneration, bilateral, with active choroidal neovascularization: Secondary | ICD-10-CM

## 2023-10-27 DIAGNOSIS — Z87898 Personal history of other specified conditions: Secondary | ICD-10-CM | POA: Diagnosis not present

## 2023-10-27 DIAGNOSIS — L821 Other seborrheic keratosis: Secondary | ICD-10-CM | POA: Diagnosis not present

## 2023-10-27 DIAGNOSIS — L853 Xerosis cutis: Secondary | ICD-10-CM | POA: Diagnosis not present

## 2023-10-27 DIAGNOSIS — D485 Neoplasm of uncertain behavior of skin: Secondary | ICD-10-CM | POA: Diagnosis not present

## 2023-10-27 DIAGNOSIS — I10 Essential (primary) hypertension: Secondary | ICD-10-CM | POA: Diagnosis not present

## 2023-10-27 DIAGNOSIS — D0462 Carcinoma in situ of skin of left upper limb, including shoulder: Secondary | ICD-10-CM | POA: Diagnosis not present

## 2023-10-27 DIAGNOSIS — H35033 Hypertensive retinopathy, bilateral: Secondary | ICD-10-CM | POA: Diagnosis not present

## 2023-10-27 DIAGNOSIS — L570 Actinic keratosis: Secondary | ICD-10-CM | POA: Diagnosis not present

## 2023-10-27 DIAGNOSIS — H43813 Vitreous degeneration, bilateral: Secondary | ICD-10-CM

## 2023-10-27 DIAGNOSIS — L309 Dermatitis, unspecified: Secondary | ICD-10-CM | POA: Diagnosis not present

## 2023-10-27 DIAGNOSIS — L814 Other melanin hyperpigmentation: Secondary | ICD-10-CM | POA: Diagnosis not present

## 2023-10-27 DIAGNOSIS — L578 Other skin changes due to chronic exposure to nonionizing radiation: Secondary | ICD-10-CM | POA: Diagnosis not present

## 2023-10-27 DIAGNOSIS — D2271 Melanocytic nevi of right lower limb, including hip: Secondary | ICD-10-CM | POA: Diagnosis not present

## 2023-10-27 DIAGNOSIS — D225 Melanocytic nevi of trunk: Secondary | ICD-10-CM | POA: Diagnosis not present

## 2023-10-27 DIAGNOSIS — L089 Local infection of the skin and subcutaneous tissue, unspecified: Secondary | ICD-10-CM | POA: Diagnosis not present

## 2023-11-03 DIAGNOSIS — R338 Other retention of urine: Secondary | ICD-10-CM | POA: Diagnosis not present

## 2023-11-18 DIAGNOSIS — I739 Peripheral vascular disease, unspecified: Secondary | ICD-10-CM | POA: Diagnosis not present

## 2023-11-18 DIAGNOSIS — B351 Tinea unguium: Secondary | ICD-10-CM | POA: Diagnosis not present

## 2023-11-30 DIAGNOSIS — R338 Other retention of urine: Secondary | ICD-10-CM | POA: Diagnosis not present

## 2023-11-30 DIAGNOSIS — N312 Flaccid neuropathic bladder, not elsewhere classified: Secondary | ICD-10-CM | POA: Diagnosis not present

## 2023-11-30 DIAGNOSIS — N210 Calculus in bladder: Secondary | ICD-10-CM | POA: Diagnosis not present

## 2023-11-30 DIAGNOSIS — N401 Enlarged prostate with lower urinary tract symptoms: Secondary | ICD-10-CM | POA: Diagnosis not present

## 2023-12-05 ENCOUNTER — Other Ambulatory Visit: Payer: Self-pay | Admitting: Family Medicine

## 2023-12-14 DIAGNOSIS — D0462 Carcinoma in situ of skin of left upper limb, including shoulder: Secondary | ICD-10-CM | POA: Diagnosis not present

## 2023-12-15 ENCOUNTER — Encounter (INDEPENDENT_AMBULATORY_CARE_PROVIDER_SITE_OTHER): Payer: Medicare Other | Admitting: Ophthalmology

## 2023-12-15 DIAGNOSIS — H35033 Hypertensive retinopathy, bilateral: Secondary | ICD-10-CM

## 2023-12-15 DIAGNOSIS — H43813 Vitreous degeneration, bilateral: Secondary | ICD-10-CM | POA: Diagnosis not present

## 2023-12-15 DIAGNOSIS — D3132 Benign neoplasm of left choroid: Secondary | ICD-10-CM

## 2023-12-15 DIAGNOSIS — H353231 Exudative age-related macular degeneration, bilateral, with active choroidal neovascularization: Secondary | ICD-10-CM | POA: Diagnosis not present

## 2023-12-15 DIAGNOSIS — H33301 Unspecified retinal break, right eye: Secondary | ICD-10-CM

## 2023-12-15 DIAGNOSIS — I10 Essential (primary) hypertension: Secondary | ICD-10-CM | POA: Diagnosis not present

## 2023-12-31 DIAGNOSIS — N312 Flaccid neuropathic bladder, not elsewhere classified: Secondary | ICD-10-CM | POA: Diagnosis not present

## 2023-12-31 DIAGNOSIS — R338 Other retention of urine: Secondary | ICD-10-CM | POA: Diagnosis not present

## 2024-01-02 ENCOUNTER — Other Ambulatory Visit: Payer: Self-pay | Admitting: Family Medicine

## 2024-01-12 ENCOUNTER — Ambulatory Visit: Payer: Medicare Other

## 2024-01-12 ENCOUNTER — Ambulatory Visit: Payer: Medicare Other | Admitting: Family Medicine

## 2024-01-17 ENCOUNTER — Encounter (HOSPITAL_COMMUNITY): Payer: Self-pay

## 2024-01-17 ENCOUNTER — Other Ambulatory Visit: Payer: Self-pay

## 2024-01-17 ENCOUNTER — Emergency Department (HOSPITAL_COMMUNITY): Admission: EM | Admit: 2024-01-17 | Discharge: 2024-01-17 | Disposition: A | Discharge status: Home / Self Care

## 2024-01-17 DIAGNOSIS — R339 Retention of urine, unspecified: Secondary | ICD-10-CM

## 2024-01-17 DIAGNOSIS — I2510 Atherosclerotic heart disease of native coronary artery without angina pectoris: Secondary | ICD-10-CM | POA: Diagnosis not present

## 2024-01-17 DIAGNOSIS — T83091A Other mechanical complication of indwelling urethral catheter, initial encounter: Secondary | ICD-10-CM | POA: Diagnosis not present

## 2024-01-17 DIAGNOSIS — Z7901 Long term (current) use of anticoagulants: Secondary | ICD-10-CM | POA: Diagnosis not present

## 2024-01-17 DIAGNOSIS — Z79899 Other long term (current) drug therapy: Secondary | ICD-10-CM | POA: Insufficient documentation

## 2024-01-17 DIAGNOSIS — Y732 Prosthetic and other implants, materials and accessory gastroenterology and urology devices associated with adverse incidents: Secondary | ICD-10-CM | POA: Insufficient documentation

## 2024-01-17 DIAGNOSIS — T839XXA Unspecified complication of genitourinary prosthetic device, implant and graft, initial encounter: Secondary | ICD-10-CM

## 2024-01-17 DIAGNOSIS — I10 Essential (primary) hypertension: Secondary | ICD-10-CM | POA: Diagnosis not present

## 2024-01-17 LAB — URINALYSIS, ROUTINE W REFLEX MICROSCOPIC
Bilirubin Urine: NEGATIVE
Glucose, UA: NEGATIVE mg/dL
Ketones, ur: NEGATIVE mg/dL
Nitrite: NEGATIVE
Protein, ur: 100 mg/dL — AB
RBC / HPF: >50 RBC/hpf (ref 0–5)
Specific Gravity, Urine: 1.014 (ref 1.005–1.030)
WBC, UA: >50 WBC/hpf (ref 0–5)
pH: 6 (ref 5.0–8.0)

## 2024-01-17 NOTE — ED Provider Triage Note (Signed)
 Emergency Medicine Provider Triage Evaluation Note  Joseph Simpson , a 89 y.o. male  was evaluated in triage.  Pt complains of urinary retention and inability to flush foley catheter that started today. No urinary symptoms, fever  Review of Systems  Positive: urinary retention and inability to flush foley catheter Negative: Fever, dysuria  Physical Exam  BP (!) 171/94 (BP Location: Right Arm)   Pulse 74   Temp 98.3 F (36.8 C) (Oral)   Resp 16   Ht 6' (1.829 m)   Wt 77 kg   SpO2 96%   BMI 23.02 kg/m  Gen:   Awake, no distress   Resp:  Normal effort  MSK:   Moves extremities without difficulty  Other:  Suprapubic pain and mild distension  Medical Decision Making  Medically screening exam initiated at 5:38 PM.  Appropriate orders placed.  Theone Stanley was informed that the remainder of the evaluation will be completed by another provider, this initial triage assessment does not replace that evaluation, and the importance of remaining in the ED until their evaluation is complete.    Judithann Sheen, PA 01/17/24 [phone]

## 2024-01-17 NOTE — ED Triage Notes (Signed)
 Pt arrived via POV c/o urinary retention that began today. Pt presents with indwelling foley catheter in place. Pt went to see his daughter, who Pt reports is a nurse to try to have the catheter irrigated, but it was unsuccessful. Pt endorses lower abdominal pain as well.

## 2024-01-17 NOTE — ED Notes (Addendum)
Pt left without receiving discharge papers.

## 2024-01-17 NOTE — ED Provider Notes (Signed)
 [place] EMERGENCY DEPARTMENT AT [facility] Provider Note   CSN: [id number] Arrival date & time: 01/17/24  1600     History {Add pertinent medical, surgical, social history, OB history to HPI:1} Chief Complaint  Patient presents with   Urinary Retention    Joseph Simpson is a 89 y.o. male.  HPI     Home Medications Prior to Admission medications   Medication Sig Start Date End Date Taking? Authorizing Provider  acetaminophen (TYLENOL) 500 MG tablet Take 500-1,000 mg by mouth 2 (two) times daily as needed for mild pain, moderate pain or headache.    [provider]  apixaban (ELIQUIS) 5 MG TABS tablet Take 1 tablet (5 mg total) by mouth 2 (two) times daily. 09/24/23   Shelva Majestic, MD  atorvastatin (LIPITOR) 20 MG tablet TAKE ONE TABLET BY MOUTH THREE TIMES A WEEK 11/12/22   Shelva Majestic, MD  BENADRYL ALLERGY 25 MG tablet Take 12.5 mg by mouth See admin instructions. Take 12.5 mg by mouth every 8 hours when taking Prochlorperazine    [provider]  bevacizumab (AVASTIN) 2.5 mg/0.1 mL SOLN 2.5 mg by Intravitreal route See admin instructions. Injection to eyes every 4 weeks - use with vigamox    [provider]  busPIRone (BUSPAR) 5 MG tablet TAKE 1 TABLET BY MOUTH THREE TIMES A DAY 07/27/23   Shelva Majestic, MD  Cholecalciferol (VITAMIN D3) 1.25 MG (50000 UT) CAPS Take 1 capsule by mouth once a week. 11/22/22   [provider]  ciprofloxacin (CIPRO) 500 MG tablet Take 500 mg by mouth 2 (two) times daily. Patient not taking: Reported on 09/24/2023 12/30/22   [provider]  cyanocobalamin (VITAMIN B12) 1000 MCG/ML injection Inject 1 mL (1,000 mcg total) into the muscle every 30 (thirty) days. By granddaughter who is an RN 01/04/23   Shelva Majestic, MD  docusate sodium (COLACE) 100 MG capsule Take 200 mg by mouth at bedtime.    [provider]  LORazepam (ATIVAN) 1 MG tablet 1TAB BY MOUTH 2 TIMES DAILY AS  NEEDED FOR ANXIETY*NEEDS TO BE SUPERVISED 8-10HRS AFTER USE-FALL RISK 01/03/24   Shelva Majestic, MD  losartan (COZAAR) 25 MG tablet TAKE 0.5-1 TAB DAILY. IF BLOOD PRESSURE OVER 145 WHEN YOU CHECK- OK TO TAKE OTHER HALF TABLET ONCE PER DAY (SO TOTAL OF 25MG  PER DAY) 12/07/23   Shelva Majestic, MD  metoprolol tartrate (LOPRESSOR) 25 MG tablet TAKE 1/2 TABLET TWICE A DAY BY MOUTH 09/24/23   Shelva Majestic, MD  pantoprazole (PROTONIX) 40 MG tablet TAKE 1 TABLET BY MOUTH TWICE A DAY 08/03/23   Shelva Majestic, MD  polyethylene glycol (MIRALAX / GLYCOLAX) 17 g packet Take 17 g by mouth daily as needed for moderate constipation (mix and drink as directed).    [provider]  prochlorperazine (COMPAZINE) 10 MG tablet Take 1 tablet (10 mg total) by mouth every 8 (eight) hours as needed for nausea or vomiting (take with 12.5mg  benadryl please). 10/27/22   Small, Brooke L, PA  SYSTANE BALANCE 0.6 % SOLN Apply to eye. 11/11/22   [provider]  tamsulosin (FLOMAX) 0.4 MG CAPS capsule Take 0.4 mg by mouth daily. Patient not taking: Reported on 09/24/2023 12/30/21   [provider]  THERATEARS NIGHTTIME 1 % GEL Apply to eye. 11/11/22   [provider]  thiamine (VITAMIN B1) 100 MG tablet Take 1 tablet (100 mg total) by mouth daily. 11/10/22  Amin, Ankit C, MD  VIGAMOX 0.5 % ophthalmic solution Place 1 drop into both eyes See admin instructions. Instill every 4 weeks as directed - use with avastin, instill 1 drop into both eyes 4 times daily for 2 days after eye injections 05/28/14   [provider]      Allergies    Tape, Aspirin, Percocet [oxycodone-acetaminophen], and Vitamins [apatate]    Review of Systems   Review of Systems  Physical Exam Updated Vital Signs BP (!) 160/85 (BP Location: Right Arm)   Pulse 85   Temp 98.5 F (36.9 C) (Oral)   Resp 17   Ht 6' (1.829 m)   Wt 77 kg   SpO2 98%   BMI 23.02 kg/m  Physical Exam  ED Results /  Procedures / Treatments   Labs (all labs ordered are listed, but only abnormal results are displayed) Labs Reviewed  URINALYSIS, ROUTINE W REFLEX MICROSCOPIC - Abnormal; Notable for the following components:      Result Value   APPearance CLOUDY (*)    Hgb urine dipstick LARGE (*)    Protein, ur 100 (*)    Leukocytes,Ua LARGE (*)    Bacteria, UA FEW (*)    All other components within normal limits    EKG None  Radiology No results found.  Procedures Procedures  {Document cardiac monitor, telemetry assessment procedure when appropriate:1}  Medications Ordered in ED Medications - No data to display  ED Course/ Medical Decision Making/ A&P   {   Click here for ABCD2, HEART and other calculatorsREFRESH Note before signing :1}                              Medical Decision Making Amount and/or Complexity of Data Reviewed Labs: ordered.   ***  {Document critical care time when appropriate:1} {Document review of labs and clinical decision tools ie heart score, Chads2Vasc2 etc:1}  {Document your independent review of radiology images, and any outside records:1} {Document your discussion with family members, caretakers, and with consultants:1} {Document social determinants of health affecting pt's care:1} {Document your decision making why or why not admission, treatments were needed:1} Final Clinical Impression(s) / ED Diagnoses Final diagnoses:  None    Rx / DC Orders ED Discharge Orders     None

## 2024-01-18 ENCOUNTER — Telehealth: Payer: Self-pay

## 2024-01-18 NOTE — Transitions of Care (Post Inpatient/ED Visit) (Unsigned)
   01/18/2024  Name: Joseph Simpson MRN: [id number] DOB: 1935-07-18  Today's TOC FU Call Status: Today's TOC FU Call Status:: Unsuccessful Call (1st Attempt) Unsuccessful Call (1st Attempt) Date: 01/18/24  Attempted to reach the patient regarding the most recent Inpatient/ED visit.  Follow Up Plan: Additional outreach attempts will be made to reach the patient to complete the Transitions of Care (Post Inpatient/ED visit) call.   Signature Karena Addison, LPN [facility] Nurse Health Advisor Direct Dial [phone]

## 2024-01-19 ENCOUNTER — Other Ambulatory Visit: Payer: Self-pay | Admitting: Family Medicine

## 2024-01-19 NOTE — Transitions of Care (Post Inpatient/ED Visit) (Unsigned)
   01/19/2024  Name: Joseph Simpson MRN: [id number] DOB: [birth year]  Today's TOC FU Call Status: Today's TOC FU Call Status:: Unsuccessful Call (2nd Attempt) Unsuccessful Call (1st Attempt) Date: 01/18/24 Unsuccessful Call (2nd Attempt) Date: 01/19/24  Attempted to reach the patient regarding the most recent Inpatient/ED visit.  Follow Up Plan: Additional outreach attempts will be made to reach the patient to complete the Transitions of Care (Post Inpatient/ED visit) call.   Signature Karena Addison, LPN [facility] Nurse Health Advisor Direct Dial [phone]

## 2024-01-20 ENCOUNTER — Other Ambulatory Visit: Payer: Self-pay | Admitting: Family Medicine

## 2024-01-20 NOTE — Transitions of Care (Post Inpatient/ED Visit) (Signed)
   01/20/2024  Name: Joseph Simpson MRN: [id number] DOB: 1935/08/29  Today's TOC FU Call Status: Today's TOC FU Call Status:: Unsuccessful Call (3rd Attempt) Unsuccessful Call (1st Attempt) Date: 01/18/24 Unsuccessful Call (2nd Attempt) Date: 01/19/24 Unsuccessful Call (3rd Attempt) Date: 01/20/24  Attempted to reach the patient regarding the most recent Inpatient/ED visit.  Follow Up Plan: No further outreach attempts will be made at this time. We have been unable to contact the patient. Signature Karena Addison, LPN [facility] Nurse Health Advisor Direct Dial [phone]

## 2024-01-21 DIAGNOSIS — R051 Acute cough: Secondary | ICD-10-CM | POA: Diagnosis not present

## 2024-01-21 DIAGNOSIS — J189 Pneumonia, unspecified organism: Secondary | ICD-10-CM | POA: Diagnosis not present

## 2024-01-27 ENCOUNTER — Encounter (INDEPENDENT_AMBULATORY_CARE_PROVIDER_SITE_OTHER): Payer: Medicare Other | Admitting: Ophthalmology

## 2024-01-27 DIAGNOSIS — D3132 Benign neoplasm of left choroid: Secondary | ICD-10-CM

## 2024-01-27 DIAGNOSIS — I10 Essential (primary) hypertension: Secondary | ICD-10-CM

## 2024-01-27 DIAGNOSIS — H353231 Exudative age-related macular degeneration, bilateral, with active choroidal neovascularization: Secondary | ICD-10-CM | POA: Diagnosis not present

## 2024-01-27 DIAGNOSIS — H43813 Vitreous degeneration, bilateral: Secondary | ICD-10-CM

## 2024-01-27 DIAGNOSIS — H35033 Hypertensive retinopathy, bilateral: Secondary | ICD-10-CM

## 2024-01-27 DIAGNOSIS — H33301 Unspecified retinal break, right eye: Secondary | ICD-10-CM

## 2024-01-28 DIAGNOSIS — N312 Flaccid neuropathic bladder, not elsewhere classified: Secondary | ICD-10-CM | POA: Diagnosis not present

## 2024-02-01 ENCOUNTER — Encounter: Payer: Self-pay | Admitting: Family Medicine

## 2024-02-10 ENCOUNTER — Ambulatory Visit (INDEPENDENT_AMBULATORY_CARE_PROVIDER_SITE_OTHER): Payer: Medicare Other | Admitting: Family Medicine

## 2024-02-10 ENCOUNTER — Encounter: Payer: Self-pay | Admitting: Family Medicine

## 2024-02-10 VITALS — BP 138/60 | HR 75 | Ht 72.0 in | Wt 179.4 lb

## 2024-02-10 DIAGNOSIS — E785 Hyperlipidemia, unspecified: Secondary | ICD-10-CM

## 2024-02-10 DIAGNOSIS — I480 Paroxysmal atrial fibrillation: Secondary | ICD-10-CM

## 2024-02-10 DIAGNOSIS — I10 Essential (primary) hypertension: Secondary | ICD-10-CM

## 2024-02-10 DIAGNOSIS — I2510 Atherosclerotic heart disease of native coronary artery without angina pectoris: Secondary | ICD-10-CM | POA: Diagnosis not present

## 2024-02-10 DIAGNOSIS — R739 Hyperglycemia, unspecified: Secondary | ICD-10-CM

## 2024-02-10 DIAGNOSIS — Z131 Encounter for screening for diabetes mellitus: Secondary | ICD-10-CM

## 2024-02-10 DIAGNOSIS — E538 Deficiency of other specified B group vitamins: Secondary | ICD-10-CM | POA: Diagnosis not present

## 2024-02-10 MED ORDER — ESCITALOPRAM OXALATE 10 MG PO TABS: 10.0000 mg | ORAL_TABLET | Freq: Every day | ORAL | 11 refills | Status: DC

## 2024-02-10 NOTE — Progress Notes (Signed)
 Phone [phone] In person visit   Subjective:   Joseph Simpson is a 89 y.o. year old very pleasant male patient who presents for/with See problem oriented charting Chief Complaint  Patient presents with   Annual Exam    See mychart message. Not fasting.   Hypertension    Past Medical History-  Patient Active Problem List   Diagnosis Date Noted   GI bleed 01/07/2022    Priority: High   Paroxysmal atrial fibrillation (HCC)     Priority: High   CAD (coronary artery disease)     Priority: High   Macular degeneration 11/17/2011    Priority: High   Anxiety 05/17/2023    Priority: Medium    Hyperglycemia 12/21/2019    Priority: Medium    B12 deficiency 03/09/2019    Priority: Medium    Gastroparesis 02/15/2017    Priority: Medium    Hx of adenomatous colonic polyps 02/15/2017    Priority: Medium    Hyperlipidemia 06/10/2016    Priority: Medium    BPH (benign prostatic hyperplasia) 11/23/2014    Priority: Medium    Palpitations 07/31/2014    Priority: Medium    Dyspnea 02/27/2014    Priority: Medium    Essential hypertension 09/23/2007    Priority: Medium    GERD 09/23/2007    Priority: Medium    Osteoarthritis of left hip 01/30/2019    Priority: Low   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 12/29/2017    Priority: Low   Left inguinal hernia 07/12/2016    Priority: Low   PAC (premature atrial contraction) 10/25/2015    Priority: Low   Squamous cell carcinoma in situ 09/09/2015    Priority: Low   Cough 05/10/2014    Priority: Low   Pulmonary nodule 05/10/2014    Priority: Low   Murmur 02/10/2012    Priority: Low   Left wrist pain 10/01/2021    Priority: 1.   Bilateral foot pain 07/04/2021    Priority: 1.   S/P hip replacement, left 02/11/2021    Priority: 1.   Delirium 11/06/2022   Altered mental status 11/05/2022   Abdominal pain, epigastric 11/02/2022   Intractable nausea and vomiting 10/30/2022   Gastritis and duodenitis  10/29/2022    Medications- reviewed and updated Current Outpatient Medications  Medication Sig Dispense Refill   acetaminophen (TYLENOL) 500 MG tablet Take 500-1,000 mg by mouth 2 (two) times daily as needed for mild pain, moderate pain or headache.     apixaban (ELIQUIS) 5 MG TABS tablet Take 1 tablet (5 mg total) by mouth 2 (two) times daily. 180 tablet 3   atorvastatin (LIPITOR) 20 MG tablet TAKE 1 TABLET BY MOUTH 3 TIMES A WEEK 36 tablet 4   BENADRYL ALLERGY 25 MG tablet Take 12.5 mg by mouth See admin instructions. Take 12.5 mg by mouth every 8 hours when taking Prochlorperazine     bevacizumab (AVASTIN) 2.5 mg/0.1 mL SOLN 2.5 mg by Intravitreal route See admin instructions. Injection to eyes every 4 weeks - use with vigamox     busPIRone (BUSPAR) 5 MG tablet TAKE 1 TABLET BY MOUTH THREE TIMES A DAY 270 tablet 2   Cholecalciferol (VITAMIN D3) 1.25 MG (50000 UT) CAPS Take 1 capsule by mouth once a week.     cyanocobalamin (VITAMIN B12) 1000 MCG/ML injection INJECT 1 ML (1,000 MCG TOTAL) INTO THE MUSCLE EVERY 30 (THIRTY) DAYS. BY GRANDDAUGHTER WHO IS AN RN 3 mL 3   docusate sodium (COLACE) 100  MG capsule Take 200 mg by mouth at bedtime.     escitalopram (LEXAPRO) 10 MG tablet Take 1 tablet (10 mg total) by mouth daily. 30 tablet 11   losartan (COZAAR) 25 MG tablet TAKE 0.5-1 TAB DAILY. IF BLOOD PRESSURE OVER 145 WHEN YOU CHECK- OK TO TAKE OTHER HALF TABLET ONCE PER DAY (SO TOTAL OF 25MG  PER DAY) 90 tablet 5   metoprolol tartrate (LOPRESSOR) 25 MG tablet TAKE 1/2 TABLET TWICE A DAY BY MOUTH 90 tablet 3   pantoprazole (PROTONIX) 40 MG tablet TAKE 1 TABLET BY MOUTH TWICE A DAY 180 tablet 3   polyethylene glycol (MIRALAX / GLYCOLAX) 17 g packet Take 17 g by mouth daily as needed for moderate constipation (mix and drink as directed).     SYSTANE BALANCE 0.6 % SOLN Apply to eye.     THERATEARS NIGHTTIME 1 % GEL Apply to eye.     thiamine (VITAMIN B1) 100 MG tablet Take 1 tablet (100 mg total) by  mouth daily.     VIGAMOX 0.5 % ophthalmic solution Place 1 drop into both eyes See admin instructions. Instill every 4 weeks as directed - use with avastin, instill 1 drop into both eyes 4 times daily for 2 days after eye injections     LORazepam (ATIVAN) 1 MG tablet 1TAB BY MOUTH 2 TIMES DAILY AS NEEDED FOR ANXIETY*NEEDS TO BE SUPERVISED 8-10HRS AFTER USE-FALL RISK (Patient not taking: Reported on 02/10/2024) 60 tablet 2   prochlorperazine (COMPAZINE) 10 MG tablet Take 1 tablet (10 mg total) by mouth every 8 (eight) hours as needed for nausea or vomiting (take with 12.5mg  benadryl please). (Patient not taking: Reported on 02/10/2024) 10 tablet 0   No current facility-administered medications for this visit.     Objective:  BP 138/60   Pulse 75   Ht 6' (1.829 m)   Wt 179 lb 6.4 oz (81.4 kg)   SpO2 97%   BMI 24.33 kg/m  Gen: NAD, resting comfortably CV: RRR no murmurs rubs or gallops Lungs: CTAB no crackles, wheeze, rhonchi Ext: trace edema Skin: warm, dry Neuro: Defers to family for most answers    Assessment and Plan   #permanent foley plan- changed by nursing q3 weeks through urology due to obstruction  -Unfortunately in early March required an emergency room visit to have this changed out due to a blockage. Some darker urine this morning- dehydration vs blood- prefers to monitor over seeing urology. Kidney stones could also contribute.   #also recently diagnosed with pneumonia  at urgent care- finished antibiotic(s) but some lingering cough-losartan could contribute.  Also wonder about allergies-trial of Flonase first-they also may want to try Zyrtec but cautious with his dementia  #Frontotemporal dementia- new diagnosis based on PET 01/28/23 #  prior ? Wernicke's encephalopathy/memory loss S: 2 admissions in December 2023.  Started on thiamine 100 mg every other day.  [facility] but no longer in late 2024.  -Required rehab -Now at home with 24/7 family  support -walks with walker -memantine with side effects and stopped -avoiding donepazil due to a fib history - stopped driving  -Family has noted worsening agitation and paranoia-he is worried that people are watching his house to break in and thinks everyone is trying to steal from them -Family has reached out about potentially starting Lexapro and weaning off of buspirone -he has already gone down to once a day buspirone. Getting more and more agitated -He has Ativan available with good  support to help him avoid emergency room visits when particularly agitated-has been used from a palliative approach A/P: Ongoing issues with dementia with worsening agitation/anxiety particularly paranoia-see last MyChart message discussion together but we had discussed trialing Lexapro.  Start Lexapro 10 mg and buspirone is now down to 1 dose per day.  Follow-up in 6 to 12 weeks.  We also considered Seroquel but would like to make only 1 medication change at a time if possible.  Can wean off of buspirone if feels doing better on the Lexapro - We discussed potential interactions such as with metoprolol and the Lexapro as well as slight increased bleeding risk on the Lexapro but he has done well on PPI so we think he will tolerate it  #Paroxysmal atrial fibrillation-prior with Dr. Sw[place] S: Anticoagulation: Eliquis 5 mg twice daily  -(Does have gastric ulceration history as well as partial removal of stomach (one third) and previously avoided aspirin with CAD) -Rate control: Metoprolol 12.5 mg twice daily -Was noted to have 6% burden on Zio monitor in April 2023- occasional palpitations and irregular heart rate  - gets agitated at times but calms down  A/P: appropriately anticoagulated and rate controlled- continue current medicine . May be going into a fib at times   #Hypertension  S: Controlled on losartan 12.5 mg daily (with extra dose 12.5 mg if blood pressure above 145), metoprolol 12.5 mg twice daily (also  helps palpitations- reduced from full tablet due to bradycardia 06/2021) -Orthostatic issues on amlodipine 1.25 mg  -Monitors his home blood pressures-no recent checks  BP Readings from Last 3 Encounters:  02/10/24 138/60  01/17/24 (!) 160/85  09/24/23 126/86  A/P: reasonable but upper limits of acceptable- continue current medications for now      #Hyperlipidemia/CAD-follows with Dr. Sw[place]iland  S: Patient has had minimally obstructive CAD on cath 2013.  Medication: Eliquis 5 mg twice daily (cannot use aspirin with prior one third removal of stomach) -Atorvastatin 20 mg 3 times a week . Diet looser this yera Lab Results  Component Value Date   CHOL 86 03/22/2023   HDL 36.70 (L) 03/22/2023   LDLCALC 35 03/22/2023   LDLDIRECT 83 06/19/2020   TRIG 76.0 03/22/2023   CHOLHDL 2 03/22/2023  A/P:  lipids at goal last year even for coronary artery disease- update today with labs- reports diet has been looser    %GERD-sees Dr. Adela Lank (prior Dr. Rhetta Mura in [place]) S: Patient is compliant with high-dose PPI-pantoprazole 40 mg twice a day per  GI.  Also on Reglan. A/P: reasonable control- no blood in stool- continue current medications   # Hyperglycemia/insulin resistance/prediabetes- peak a1c 6.1- check next visit- not quite due  # B12 deficiency- check next visit S: Current treatment/medication (oral vs. IM): Monthly B12 injections at home through granddaughter A/P: likely controlled- update B12 next labs   Recommended follow up: Return in about 3 months (around 05/12/2024) for followup or sooner if needed.Schedule b4 you leave. Future Appointments  Date Time Provider Department Center  03/09/2024  8:20 AM Sherrie George, MD [facility] None  03/23/2024  2:20 PM Shelva Majestic, MD [facility]  05/22/2024 11:00 AM Shelva Majestic, MD [facility]   Lab/Order associations:   ICD-10-CM   1. Coronary artery disease involving native coronary artery of native heart without angina pectoris   I25.10     2. Essential hypertension  I10     3. Hyperlipidemia, unspecified hyperlipidemia type  E78.5     4. B12  deficiency  E53.8     5. Hyperglycemia  R73.9     6. Screening for diabetes mellitus  Z13.1     7. Paroxysmal atrial fibrillation (HCC)  I48.0       Meds ordered this encounter  Medications   escitalopram (LEXAPRO) 10 MG tablet    Sig: Take 1 tablet (10 mg total) by mouth daily.    Dispense:  30 tablet    Refill:  11    Return precautions advised.  Tana Conch, MD

## 2024-02-10 NOTE — Patient Instructions (Addendum)
 Can stay on buspirone 5 mg daily for now but after 2 weeks on Lexapro if he's doing better can stop that as well  Taking the medicine as directed and not missing any doses  of the Lexapro 10 mg is one of the best things you can do to treat your anxiety/agitation.  Here are some things to keep in mind:  Side effects (stomach upset, some increased anxiety) may happen before you notice a benefit.  These side effects typically go away over time. Changes to your dose of medicine or a change in medication all together is sometimes necessary Most people need to be on medication at least 6-12 months- my plan would be long term with you Many people will notice an improvement within two weeks but the full effect of the medication can take up to 6 weeks Stopping the medication when you start feeling better often results in a return of symptoms If you start having thoughts of hurting yourself or others after starting this medicine, call our office immediately at [phone] or seek care through 911.    Recommended follow up: Return in about 3 months (around 05/12/2024) for followup or sooner if needed.Schedule b4 you leave.

## 2024-02-28 DIAGNOSIS — R3914 Feeling of incomplete bladder emptying: Secondary | ICD-10-CM | POA: Diagnosis not present

## 2024-03-03 ENCOUNTER — Other Ambulatory Visit: Payer: Self-pay | Admitting: Family Medicine

## 2024-03-06 ENCOUNTER — Encounter: Payer: Self-pay | Admitting: Family Medicine

## 2024-03-07 ENCOUNTER — Other Ambulatory Visit: Payer: Self-pay | Admitting: Family Medicine

## 2024-03-09 ENCOUNTER — Encounter (INDEPENDENT_AMBULATORY_CARE_PROVIDER_SITE_OTHER): Admitting: Ophthalmology

## 2024-03-09 DIAGNOSIS — H33301 Unspecified retinal break, right eye: Secondary | ICD-10-CM | POA: Diagnosis not present

## 2024-03-09 DIAGNOSIS — D3132 Benign neoplasm of left choroid: Secondary | ICD-10-CM | POA: Diagnosis not present

## 2024-03-09 DIAGNOSIS — I10 Essential (primary) hypertension: Secondary | ICD-10-CM | POA: Diagnosis not present

## 2024-03-09 DIAGNOSIS — H353231 Exudative age-related macular degeneration, bilateral, with active choroidal neovascularization: Secondary | ICD-10-CM

## 2024-03-09 DIAGNOSIS — H43813 Vitreous degeneration, bilateral: Secondary | ICD-10-CM

## 2024-03-09 DIAGNOSIS — H35033 Hypertensive retinopathy, bilateral: Secondary | ICD-10-CM | POA: Diagnosis not present

## 2024-03-23 ENCOUNTER — Ambulatory Visit: Payer: Medicare Other | Admitting: Family Medicine

## 2024-03-29 DIAGNOSIS — N312 Flaccid neuropathic bladder, not elsewhere classified: Secondary | ICD-10-CM | POA: Diagnosis not present

## 2024-04-20 ENCOUNTER — Encounter (INDEPENDENT_AMBULATORY_CARE_PROVIDER_SITE_OTHER): Admitting: Ophthalmology

## 2024-04-20 DIAGNOSIS — H353231 Exudative age-related macular degeneration, bilateral, with active choroidal neovascularization: Secondary | ICD-10-CM | POA: Diagnosis not present

## 2024-04-20 DIAGNOSIS — H43813 Vitreous degeneration, bilateral: Secondary | ICD-10-CM

## 2024-04-20 DIAGNOSIS — I10 Essential (primary) hypertension: Secondary | ICD-10-CM

## 2024-04-20 DIAGNOSIS — H35033 Hypertensive retinopathy, bilateral: Secondary | ICD-10-CM | POA: Diagnosis not present

## 2024-04-20 DIAGNOSIS — D3132 Benign neoplasm of left choroid: Secondary | ICD-10-CM

## 2024-04-20 DIAGNOSIS — H33301 Unspecified retinal break, right eye: Secondary | ICD-10-CM

## 2024-04-24 ENCOUNTER — Encounter: Payer: Self-pay | Admitting: Family Medicine

## 2024-04-27 ENCOUNTER — Telehealth: Payer: Self-pay

## 2024-04-27 DIAGNOSIS — L309 Dermatitis, unspecified: Secondary | ICD-10-CM | POA: Diagnosis not present

## 2024-04-27 DIAGNOSIS — D225 Melanocytic nevi of trunk: Secondary | ICD-10-CM | POA: Diagnosis not present

## 2024-04-27 DIAGNOSIS — D485 Neoplasm of uncertain behavior of skin: Secondary | ICD-10-CM | POA: Diagnosis not present

## 2024-04-27 DIAGNOSIS — L821 Other seborrheic keratosis: Secondary | ICD-10-CM | POA: Diagnosis not present

## 2024-04-27 DIAGNOSIS — D2271 Melanocytic nevi of right lower limb, including hip: Secondary | ICD-10-CM | POA: Diagnosis not present

## 2024-04-27 DIAGNOSIS — L578 Other skin changes due to chronic exposure to nonionizing radiation: Secondary | ICD-10-CM | POA: Diagnosis not present

## 2024-04-27 DIAGNOSIS — C4441 Basal cell carcinoma of skin of scalp and neck: Secondary | ICD-10-CM | POA: Diagnosis not present

## 2024-04-27 DIAGNOSIS — D0461 Carcinoma in situ of skin of right upper limb, including shoulder: Secondary | ICD-10-CM | POA: Diagnosis not present

## 2024-04-27 DIAGNOSIS — Z87898 Personal history of other specified conditions: Secondary | ICD-10-CM | POA: Diagnosis not present

## 2024-04-27 DIAGNOSIS — D2230 Melanocytic nevi of unspecified part of face: Secondary | ICD-10-CM | POA: Diagnosis not present

## 2024-04-27 DIAGNOSIS — L814 Other melanin hyperpigmentation: Secondary | ICD-10-CM | POA: Diagnosis not present

## 2024-04-27 NOTE — Telephone Encounter (Signed)
 Forms have been refaxed.  Copied from CRM [id number]. Topic: General - Other >> Apr 26, 2024  1:39 PM Dorisann Garre T wrote: Reason for CRM: kristin from [employer] is calling in requesting a detailed order to be refaxed over

## 2024-04-28 DIAGNOSIS — R3914 Feeling of incomplete bladder emptying: Secondary | ICD-10-CM | POA: Diagnosis not present

## 2024-05-09 ENCOUNTER — Encounter: Payer: Self-pay | Admitting: Family Medicine

## 2024-05-09 ENCOUNTER — Ambulatory Visit (INDEPENDENT_AMBULATORY_CARE_PROVIDER_SITE_OTHER): Admitting: Family Medicine

## 2024-05-09 ENCOUNTER — Ambulatory Visit: Payer: Self-pay | Admitting: Family Medicine

## 2024-05-09 VITALS — BP 134/70 | HR 68 | Temp 98.4°F | Ht 72.0 in | Wt 191.0 lb

## 2024-05-09 DIAGNOSIS — I10 Essential (primary) hypertension: Secondary | ICD-10-CM | POA: Diagnosis not present

## 2024-05-09 DIAGNOSIS — I480 Paroxysmal atrial fibrillation: Secondary | ICD-10-CM

## 2024-05-09 DIAGNOSIS — R739 Hyperglycemia, unspecified: Secondary | ICD-10-CM | POA: Diagnosis not present

## 2024-05-09 DIAGNOSIS — I2510 Atherosclerotic heart disease of native coronary artery without angina pectoris: Secondary | ICD-10-CM

## 2024-05-09 DIAGNOSIS — E785 Hyperlipidemia, unspecified: Secondary | ICD-10-CM

## 2024-05-09 DIAGNOSIS — Z131 Encounter for screening for diabetes mellitus: Secondary | ICD-10-CM | POA: Diagnosis not present

## 2024-05-09 DIAGNOSIS — E538 Deficiency of other specified B group vitamins: Secondary | ICD-10-CM | POA: Diagnosis not present

## 2024-05-09 LAB — COMPREHENSIVE METABOLIC PANEL WITH GFR
ALT: 18 U/L (ref 0–53)
AST: 21 U/L (ref 0–37)
Albumin: 4.2 g/dL (ref 3.5–5.2)
Alkaline Phosphatase: 95 U/L (ref 39–117)
BUN: 10 mg/dL (ref 6–23)
CO2: 31 meq/L (ref 19–32)
Calcium: 9.2 mg/dL (ref 8.4–10.5)
Chloride: 104 meq/L (ref 96–112)
Creatinine, Ser: 0.82 mg/dL (ref 0.40–1.50)
GFR: 79 mL/min (ref >60.00)
Glucose, Bld: 114 mg/dL — ABNORMAL HIGH (ref 70–99)
Potassium: 3.8 meq/L (ref 3.5–5.1)
Sodium: 140 meq/L (ref 135–145)
Total Bilirubin: 1.3 mg/dL — ABNORMAL HIGH (ref 0.2–1.2)
Total Protein: 6.9 g/dL (ref 6.0–8.3)

## 2024-05-09 LAB — CBC WITH DIFFERENTIAL/PLATELET
Basophils Absolute: 0 10*3/uL (ref 0.0–0.1)
Basophils Relative: 0.5 % (ref 0.0–3.0)
Eosinophils Absolute: 0.3 10*3/uL (ref 0.0–0.7)
Eosinophils Relative: 5 % (ref 0.0–5.0)
HCT: 45.9 % (ref 39.0–52.0)
Hemoglobin: 15.6 g/dL (ref 13.0–17.0)
Lymphocytes Relative: 13.8 % (ref 12.0–46.0)
Lymphs Abs: 0.9 10*3/uL (ref 0.7–4.0)
MCHC: 33.9 g/dL (ref 30.0–36.0)
MCV: 91.2 fl (ref 78.0–100.0)
Monocytes Absolute: 0.6 10*3/uL (ref 0.1–1.0)
Monocytes Relative: 8.8 % (ref 3.0–12.0)
Neutro Abs: 4.7 10*3/uL (ref 1.4–7.7)
Neutrophils Relative %: 71.9 % (ref 43.0–77.0)
Platelets: 139 10*3/uL — ABNORMAL LOW (ref 150.0–400.0)
RBC: 5.03 Mil/uL (ref 4.22–5.81)
RDW: 13.8 % (ref 11.5–15.5)
WBC: 6.5 10*3/uL (ref 4.0–10.5)

## 2024-05-09 LAB — LIPID PANEL
Cholesterol: 118 mg/dL (ref 0–200)
HDL: 43.2 mg/dL (ref >39.00)
LDL Cholesterol: 64 mg/dL (ref 0–99)
NonHDL: 75.21
Total CHOL/HDL Ratio: 3
Triglycerides: 57 mg/dL (ref 0.0–149.0)
VLDL: 11.4 mg/dL (ref 0.0–40.0)

## 2024-05-09 LAB — TSH: TSH: 5.37 u[IU]/mL (ref 0.35–5.50)

## 2024-05-09 LAB — HEMOGLOBIN A1C: Hgb A1c MFr Bld: 6.4 % (ref 4.6–6.5)

## 2024-05-09 LAB — VITAMIN B12: Vitamin B-12: 417 pg/mL (ref 211–911)

## 2024-05-09 NOTE — Progress Notes (Signed)
 Phone [phone] In person visit   Subjective:   Joseph Simpson is a 89 y.o. year old very pleasant male patient who presents for/with See problem oriented charting Chief Complaint  Patient presents with   Follow-up   Leg Swelling    Past Medical History-  Patient Active Problem List   Diagnosis Date Noted   GI bleed 01/07/2022    Priority: High   Paroxysmal atrial fibrillation (HCC)     Priority: High   CAD (coronary artery disease)     Priority: High   Macular degeneration 11/17/2011    Priority: High   Anxiety 05/17/2023    Priority: Medium    Hyperglycemia 12/21/2019    Priority: Medium    B12 deficiency 03/09/2019    Priority: Medium    Gastroparesis 02/15/2017    Priority: Medium    Hx of adenomatous colonic polyps 02/15/2017    Priority: Medium    Hyperlipidemia 06/10/2016    Priority: Medium    BPH (benign prostatic hyperplasia) 11/23/2014    Priority: Medium    Palpitations 07/31/2014    Priority: Medium    Dyspnea 02/27/2014    Priority: Medium    Essential hypertension 09/23/2007    Priority: Medium    GERD 09/23/2007    Priority: Medium    Osteoarthritis of left hip 01/30/2019    Priority: Low   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 12/29/2017    Priority: Low   Left inguinal hernia 07/12/2016    Priority: Low   PAC (premature atrial contraction) 10/25/2015    Priority: Low   Squamous cell carcinoma in situ 09/09/2015    Priority: Low   Cough 05/10/2014    Priority: Low   Pulmonary nodule 05/10/2014    Priority: Low   Murmur 02/10/2012    Priority: Low   Left wrist pain 10/01/2021    Priority: 1.   Bilateral foot pain 07/04/2021    Priority: 1.   S/P hip replacement, left 02/11/2021    Priority: 1.   Delirium 11/06/2022   Altered mental status 11/05/2022   Abdominal pain, epigastric 11/02/2022   Intractable nausea and vomiting 10/30/2022   Gastritis and duodenitis 10/29/2022    Medications- reviewed and  updated Current Outpatient Medications  Medication Sig Dispense Refill   acetaminophen  (TYLENOL ) 500 MG tablet Take 500-1,000 mg by mouth 2 (two) times daily as needed for mild pain, moderate pain or headache.     apixaban  (ELIQUIS ) 5 MG TABS tablet Take 1 tablet (5 mg total) by mouth 2 (two) times daily. 180 tablet 3   atorvastatin  (LIPITOR) 20 MG tablet TAKE 1 TABLET BY MOUTH 3 TIMES A WEEK 36 tablet 4   BENADRYL  ALLERGY 25 MG tablet Take 12.5 mg by mouth See admin instructions. Take 12.5 mg by mouth every 8 hours when taking Prochlorperazine      bevacizumab  (AVASTIN ) 2.5 mg/0.1 mL SOLN 2.5 mg by Intravitreal route See admin instructions. Injection to eyes every 4 weeks - use with vigamox     Cholecalciferol (VITAMIN D3) 1.25 MG (50000 UT) CAPS Take 1 capsule by mouth once a week.     cyanocobalamin  (VITAMIN B12) 1000 MCG/ML injection INJECT 1 ML (1,000 MCG TOTAL) INTO THE MUSCLE EVERY 30 (THIRTY) DAYS. BY GRANDDAUGHTER WHO IS AN RN 3 mL 3   docusate sodium  (COLACE) 100 MG capsule Take 200 mg by mouth at bedtime.     escitalopram  (LEXAPRO ) 10 MG tablet TAKE 1 TABLET BY MOUTH EVERY DAY 90 tablet  4   LORazepam  (ATIVAN ) 1 MG tablet 1TAB BY MOUTH 2 TIMES DAILY AS NEEDED FOR ANXIETY*NEEDS TO BE SUPERVISED 8-10HRS AFTER USE-FALL RISK 60 tablet 2   losartan  (COZAAR ) 25 MG tablet TAKE 0.5-1 TAB DAILY. IF BLOOD PRESSURE OVER 145 WHEN YOU CHECK- OK TO TAKE OTHER HALF TABLET ONCE PER DAY (SO TOTAL OF 25MG  PER DAY) 90 tablet 5   metoprolol  tartrate (LOPRESSOR ) 25 MG tablet TAKE 1/2 TABLET TWICE A DAY BY MOUTH 90 tablet 3   pantoprazole  (PROTONIX ) 40 MG tablet TAKE 1 TABLET BY MOUTH TWICE A DAY 180 tablet 3   polyethylene glycol (MIRALAX  / GLYCOLAX ) 17 g packet Take 17 g by mouth daily as needed for moderate constipation (mix and drink as directed).     prochlorperazine  (COMPAZINE ) 10 MG tablet Take 1 tablet (10 mg total) by mouth every 8 (eight) hours as needed for nausea or vomiting (take with 12.5mg   benadryl  please). 10 tablet 0   SYSTANE BALANCE 0.6 % SOLN Apply to eye.     THERATEARS NIGHTTIME 1 % GEL Apply to eye.     thiamine  (VITAMIN B1) 100 MG tablet Take 1 tablet (100 mg total) by mouth daily.     VIGAMOX 0.5 % ophthalmic solution Place 1 drop into both eyes See admin instructions. Instill every 4 weeks as directed - use with avastin , instill 1 drop into both eyes 4 times daily for 2 days after eye injections     No current facility-administered medications for this visit.     Objective:  BP 134/70   Pulse 68   Temp 98.4 F (36.9 C)   Ht 6' (1.829 m)   Wt 191 lb (86.6 kg)   SpO2 94%   BMI 25.90 kg/m  Gen: NAD, resting comfortably CV: RRR no murmurs rubs or gallops Lungs: CTAB no crackles, wheeze, rhonchi Abdomen: soft/nontender/nondistended/normal bowel sounds. No rebound or guarding.  Ext: trace to 1+  edema on right and 1+ on the left Skin: warm, dry Foley catheter attached to walker    Assessment and Plan    #Edema  S: off and on for last few months. Is more stationary/less mobile. Worse as day goes on. Better in morning when wakes up.  -has compression stockings and may try Assessment / plan: May be worth retrying compression stockings- also going to check labs for any obvious cause of swelling. Recent echocardiogram in 2023 pretty reassuring so hold off on that for now - if worsening let me know -no calf pain or swelling so doubt DVT. Plus duplex last July for similar issues with no DVT  #permanent foley plan- changed by nursing q4 weeks through urology due to obstruction   #Frontotemporal dementia- new diagnosis based on PET 01/28/23 #  prior ? Wernicke's encephalopathy/memory loss S: 2 admissions in December 2023.  Started on thiamine  100 mg every other day- but no longer drinking and no longer taking.  [facility] but no longer in late 2024 as family has opted out.  -Now at home with 24/7 family support -walks with  walker -memantine with side effects and stopped -avoiding donepazil due to a fib history - stopped driving A/P: ongoing issues- continue to monitor- seems more stable    # Anxiety S: Patient notes increased anxiety in the afternoons.   - Lexapro  10 mg daily . Afternoon irritation much improved. Thankfully with this. Still needing ativan  at night but no Emergency Department visits with this thankfully- was major concern before -  PRIOR buspirone  not effective A/P: doing very well- continue current medications  -halluciations at night also better   #Paroxysmal atrial fibrillation-prior with Dr. [place] but prescriptions now through us  as taking more palliative approach S: Anticoagulation: Eliquis  5 mg twice daily -Rate control: Metoprolol  12.5 mg twice daily -Was noted to have 6% burden on Zio monitor in April 2023   A/P: appropriately anticoagulated and rate controlled- continue current medicine    #Hypertension  S: Controlled on losartan  12.5 mg daily (with extra dose 12.5 mg if blood pressure above 145), metoprolol  12.5 mg twice daily (also helps palpitations- reduced from full tablet due to bradycardia 06/2021)  A/P: blood pressure better and well controlled on repeat- continue current medications      #Hyperlipidemia/CAD-follows with Dr. [place]  S: Patient has had minimally obstructive CAD on cath 2013.  Medication: Eliquis  5 mg twice daily (cannot use aspirin  with prior one third removal of stomach) -Atorvastatin  20 mg 3 times a week  -LDL goal at least under 100 but would prefer under 70  -no chest pain or shortness of breath but not very active Lab Results  Component Value Date   CHOL 86 03/22/2023   HDL 36.70 (L) 03/22/2023   LDLCALC 35 03/22/2023   LDLDIRECT 83 06/19/2020   TRIG 76.0 03/22/2023   CHOLHDL 2 03/22/2023  A/P: coronary artery disease asymptomatic continue current medications Lipids at goal last year- update today    %GERD-sees Dr. Leigh (prior Dr. Shana  in [place]) S: Patient is compliant with high-dose PPI-pantoprazole  40 mg twice a day per  GI.  Also on Reglan . A/P: overall stable- continue current medications   # Hyperglycemia/insulin resistance/prediabetes- peak a1c 6.1 S:  Medication: none Lab Results  Component Value Date   HGBA1C 6.0 09/24/2023   HGBA1C 6.1 03/22/2023   HGBA1C 5.7 09/21/2022   A/P: hopefully stable- update a1c today. Continue no meds for now   # B12 deficiency S: Current treatment/medication (oral vs. IM): Monthly B12 injections at home A/P: hopefully stable- update b12 today. Continue current meds for now    Recommended follow up: Return in about 6 months (around 11/08/2024) for followup or sooner if needed.Schedule b4 you leave. Future Appointments  Date Time Provider Department Center  05/22/2024 11:00 AM Katrinka Garnette KIDD, MD [facility]  06/01/2024  8:15 AM Alvia Norleen BIRCH, MD [facility] None    Lab/Order associations:   ICD-10-CM   1. Coronary artery disease involving native coronary artery of native heart without angina pectoris  I25.10     2. B12 deficiency  E53.8 Vitamin B12    3. Paroxysmal atrial fibrillation (HCC)  I48.0     4. Essential hypertension  I10     5. Hyperlipidemia, unspecified hyperlipidemia type  E78.5 Comprehensive metabolic panel with GFR    CBC with Differential/Platelet    TSH    Lipid panel    6. Hyperglycemia  R73.9 Hemoglobin A1c    7. Screening for diabetes mellitus  Z13.1 Hemoglobin A1c      No orders of the defined types were placed in this encounter.   Return precautions advised.  Garnette Katrinka, MD

## 2024-05-09 NOTE — Patient Instructions (Addendum)
 Please stop by lab before you go If you have mychart- we will send your results within 3 business days of us  receiving them.  If you do not have mychart- we will call you about results within 5 business days of us  receiving them.  *please also note that you will see labs on mychart as soon as they post. I will later go in and write notes on them- will say notes from Dr. Katrinka   May be worth retrying compression stockings- also going to check labs for any obvious cause of swelling. Recent echocardiogram in 2023 pretty reassuring so hold off on that for now - if worsening let me know  Recommended follow up: Return in about 6 months (around 11/08/2024) for followup or sooner if needed.Schedule b4 you leave. -cancel July 7th visit on the way out today

## 2024-05-11 DIAGNOSIS — F0390 Unspecified dementia without behavioral disturbance: Secondary | ICD-10-CM | POA: Diagnosis not present

## 2024-05-11 DIAGNOSIS — F32A Depression, unspecified: Secondary | ICD-10-CM | POA: Diagnosis not present

## 2024-05-11 DIAGNOSIS — K219 Gastro-esophageal reflux disease without esophagitis: Secondary | ICD-10-CM | POA: Diagnosis not present

## 2024-05-11 DIAGNOSIS — I4891 Unspecified atrial fibrillation: Secondary | ICD-10-CM | POA: Diagnosis not present

## 2024-05-17 ENCOUNTER — Other Ambulatory Visit: Payer: Self-pay | Admitting: Family Medicine

## 2024-05-17 DIAGNOSIS — I739 Peripheral vascular disease, unspecified: Secondary | ICD-10-CM | POA: Diagnosis not present

## 2024-05-17 DIAGNOSIS — B351 Tinea unguium: Secondary | ICD-10-CM | POA: Diagnosis not present

## 2024-05-22 ENCOUNTER — Ambulatory Visit: Admitting: Family Medicine

## 2024-05-29 ENCOUNTER — Telehealth: Payer: Self-pay

## 2024-05-29 DIAGNOSIS — N312 Flaccid neuropathic bladder, not elsewhere classified: Secondary | ICD-10-CM | POA: Diagnosis not present

## 2024-05-29 DIAGNOSIS — R338 Other retention of urine: Secondary | ICD-10-CM | POA: Diagnosis not present

## 2024-05-29 NOTE — Telephone Encounter (Signed)
 Form was placed in shred once it was faxed back.  Copied from CRM [id number]. Topic: Clinical - Order For Equipment >> May 26, 2024  4:53 PM Drema MATSU wrote: Reason for CRM: [employer] faxed over detailed written order for catherters several times and states it was faxed back several times. She states that a [employer] catherter is needed for patient. She is needing the medical reason filled out in section 3.

## 2024-05-31 DIAGNOSIS — C44622 Squamous cell carcinoma of skin of right upper limb, including shoulder: Secondary | ICD-10-CM | POA: Diagnosis not present

## 2024-06-01 ENCOUNTER — Emergency Department (HOSPITAL_COMMUNITY)
Admission: EM | Admit: 2024-06-01 | Discharge: 2024-06-01 | Disposition: A | Discharge status: Home / Self Care | Attending: Emergency Medicine | Admitting: Emergency Medicine

## 2024-06-01 ENCOUNTER — Encounter (INDEPENDENT_AMBULATORY_CARE_PROVIDER_SITE_OTHER): Admitting: Ophthalmology

## 2024-06-01 ENCOUNTER — Emergency Department (HOSPITAL_COMMUNITY)

## 2024-06-01 ENCOUNTER — Encounter (HOSPITAL_COMMUNITY): Payer: Self-pay

## 2024-06-01 DIAGNOSIS — N200 Calculus of kidney: Secondary | ICD-10-CM | POA: Diagnosis not present

## 2024-06-01 DIAGNOSIS — I4430 Unspecified atrioventricular block: Secondary | ICD-10-CM | POA: Diagnosis not present

## 2024-06-01 DIAGNOSIS — R339 Retention of urine, unspecified: Secondary | ICD-10-CM | POA: Insufficient documentation

## 2024-06-01 DIAGNOSIS — I2510 Atherosclerotic heart disease of native coronary artery without angina pectoris: Secondary | ICD-10-CM | POA: Diagnosis not present

## 2024-06-01 DIAGNOSIS — I700 Atherosclerosis of aorta: Secondary | ICD-10-CM | POA: Diagnosis not present

## 2024-06-01 DIAGNOSIS — K5730 Diverticulosis of large intestine without perforation or abscess without bleeding: Secondary | ICD-10-CM | POA: Insufficient documentation

## 2024-06-01 DIAGNOSIS — N390 Urinary tract infection, site not specified: Secondary | ICD-10-CM | POA: Diagnosis not present

## 2024-06-01 DIAGNOSIS — I10 Essential (primary) hypertension: Secondary | ICD-10-CM | POA: Insufficient documentation

## 2024-06-01 DIAGNOSIS — G309 Alzheimer's disease, unspecified: Secondary | ICD-10-CM | POA: Diagnosis not present

## 2024-06-01 DIAGNOSIS — F0280 Dementia in other diseases classified elsewhere without behavioral disturbance: Secondary | ICD-10-CM | POA: Insufficient documentation

## 2024-06-01 DIAGNOSIS — Z79899 Other long term (current) drug therapy: Secondary | ICD-10-CM | POA: Diagnosis not present

## 2024-06-01 DIAGNOSIS — J90 Pleural effusion, not elsewhere classified: Secondary | ICD-10-CM | POA: Diagnosis not present

## 2024-06-01 DIAGNOSIS — R0902 Hypoxemia: Secondary | ICD-10-CM | POA: Diagnosis not present

## 2024-06-01 DIAGNOSIS — N401 Enlarged prostate with lower urinary tract symptoms: Secondary | ICD-10-CM | POA: Diagnosis not present

## 2024-06-01 DIAGNOSIS — Z7901 Long term (current) use of anticoagulants: Secondary | ICD-10-CM | POA: Diagnosis not present

## 2024-06-01 DIAGNOSIS — N400 Enlarged prostate without lower urinary tract symptoms: Secondary | ICD-10-CM | POA: Diagnosis not present

## 2024-06-01 DIAGNOSIS — T83511A Infection and inflammatory reaction due to indwelling urethral catheter, initial encounter: Secondary | ICD-10-CM | POA: Diagnosis not present

## 2024-06-01 LAB — BASIC METABOLIC PANEL WITH GFR
Anion gap: 10 (ref 5–15)
BUN: 8 mg/dL (ref 8–23)
CO2: 25 mmol/L (ref 22–32)
Calcium: 9 mg/dL (ref 8.9–10.3)
Chloride: 104 mmol/L (ref 98–111)
Creatinine, Ser: 0.73 mg/dL (ref 0.61–1.24)
GFR, Estimated: >60 mL/min (ref >60)
Glucose, Bld: 113 mg/dL — ABNORMAL HIGH (ref 70–99)
Potassium: 3.7 mmol/L (ref 3.5–5.1)
Sodium: 139 mmol/L (ref 135–145)

## 2024-06-01 LAB — URINALYSIS, ROUTINE W REFLEX MICROSCOPIC
Bilirubin Urine: NEGATIVE
Glucose, UA: NEGATIVE mg/dL
Ketones, ur: NEGATIVE mg/dL
Nitrite: POSITIVE — AB
Protein, ur: 100 mg/dL — AB
Specific Gravity, Urine: 1.012 (ref 1.005–1.030)
WBC, UA: >50 WBC/hpf (ref 0–5)
pH: 8 (ref 5.0–8.0)

## 2024-06-01 LAB — CBC
HCT: 48.2 % (ref 39.0–52.0)
Hemoglobin: 16.1 g/dL (ref 13.0–17.0)
MCH: 30.9 pg (ref 26.0–34.0)
MCHC: 33.4 g/dL (ref 30.0–36.0)
MCV: 92.5 fL (ref 80.0–100.0)
Platelets: 143 K/uL — ABNORMAL LOW (ref 150–400)
RBC: 5.21 MIL/uL (ref 4.22–5.81)
RDW: 13 % (ref 11.5–15.5)
WBC: 6.6 K/uL (ref 4.0–10.5)
nRBC: 0 % (ref 0.0–0.2)

## 2024-06-01 LAB — LIPASE, BLOOD: Lipase: 29 U/L (ref 11–51)

## 2024-06-01 MED ORDER — CEPHALEXIN 500 MG PO CAPS: 500.0000 mg | ORAL_CAPSULE | Freq: Three times a day (TID) | ORAL | 0 refills | Status: DC

## 2024-06-01 MED ORDER — LOSARTAN POTASSIUM 25 MG PO TABS
25.0000 mg | ORAL_TABLET | Freq: Once | ORAL | Status: AC
  Administered 2024-06-01: 25 mg via ORAL
  Filled 2024-06-01: qty 1

## 2024-06-01 NOTE — Discharge Instructions (Addendum)
 You have a urinary tract infection that was found today. We are sending you home with some antibiotics to take every day, three times a day, for the next 10 days. This medication name is Keflex , or Cephalexin . We had done a CT scan to check your bowels, and you are moderately constipated. I would increase your home dose of Miralax  today to 4 caps in one 32oz bottle of water  or gatorade to help pass a bowel movement. Tomorrow, you can do 3 caps in 32oz of water . Saturday do 2 caps in 32oz water , and then do 1 cap in 32oz of water  on Sunday.  If you start to develop a high fever or more confusion than normal in the next few days, please return to the emergency department.

## 2024-06-01 NOTE — ED Triage Notes (Signed)
 Pt BIBA from home for possible UTI.  EMS was called by patient for shortness of breath.  On arrival pt was 99% on room air.  Family states he may have UTI from foley.    V/S within range except temp of 99 oral.  EMS gave 650 of Tylenol    Pt has hx of dementia

## 2024-06-01 NOTE — ED Provider Notes (Signed)
 [place] EMERGENCY DEPARTMENT AT [facility] Provider Note   CSN: [id number] Arrival date & time: 06/01/24  9167     Patient presents with: Urinary Retention   Joseph Simpson is a 89 y.o. male with a past medical history of Alzheimer's dementia, hypertension, CAD, BPH, and HLD who was brought to the ED by EMS for his inability to have a bowel movement. The patient is hard of hearing at baseline. The patient's son is at bedside to help with history as the patient has memory difficulty. The patient's son states the patient woke up this morning hollering because he was short of breath. He was also concerned about not having a bowel movement, and EMS was called. The patient's son states the only medication the patient took was Lorazepam  1mg  for his agitated state before EMS arrived. The patient had told the paramedics he had been having difficulty having a bowel movement for the past day. In the ED, he states he has not had a BM for the past two days. He has an indwelling catheter that is changed monthly, with the most recent change being earlier this week. Urinary output in the last 3 hours is urine. The patient denies fever, chills, shortness of breath in the ED, abdominal pain, chest pain, hematuria, or dysuria.      Prior to Admission medications   Medication Sig Start Date End Date Taking? Authorizing Provider  acetaminophen  (TYLENOL ) 500 MG tablet Take 500-1,000 mg by mouth 2 (two) times daily as needed for mild pain, moderate pain or headache.    [provider]  apixaban  (ELIQUIS ) 5 MG TABS tablet Take 1 tablet (5 mg total) by mouth 2 (two) times daily. 09/24/23   Katrinka Garnette KIDD, MD  atorvastatin  (LIPITOR) 20 MG tablet TAKE 1 TABLET BY MOUTH 3 TIMES A WEEK 01/20/24   Katrinka Garnette KIDD, MD  BENADRYL  ALLERGY 25 MG tablet Take 12.5 mg by mouth See admin instructions. Take 12.5 mg by mouth every 8 hours when taking Prochlorperazine     [provider]   bevacizumab  (AVASTIN ) 2.5 mg/0.1 mL SOLN 2.5 mg by Intravitreal route See admin instructions. Injection to eyes every 4 weeks - use with vigamox    [provider]  Cholecalciferol (VITAMIN D3) 1.25 MG (50000 UT) CAPS Take 1 capsule by mouth once a week. 11/22/22   [provider]  cyanocobalamin  (VITAMIN B12) 1000 MCG/ML injection INJECT 1 ML (1,000 MCG TOTAL) INTO THE MUSCLE EVERY 30 (THIRTY) DAYS. BY GRANDDAUGHTER WHO IS AN RN 01/19/24   Katrinka Garnette KIDD, MD  docusate sodium  (COLACE) 100 MG capsule Take 200 mg by mouth at bedtime.    [provider]  escitalopram  (LEXAPRO ) 10 MG tablet TAKE 1 TABLET BY MOUTH EVERY DAY 03/07/24   Katrinka Garnette KIDD, MD  LORazepam  (ATIVAN ) 1 MG tablet TAKE 1 TAB BY MOUTH 2 TIMES DAILY AS NEEDED FOR ANXIETY*NEEDS TO BE SUPERVISED 8-10HRS AFTER USE-FALL RISK 05/18/24   Katrinka Garnette KIDD, MD  losartan  (COZAAR ) 25 MG tablet TAKE 0.5-1 TAB DAILY. IF BLOOD PRESSURE OVER 145 WHEN YOU CHECK- OK TO TAKE OTHER HALF TABLET ONCE PER DAY (SO TOTAL OF 25MG  PER DAY) 12/07/23   Katrinka Garnette KIDD, MD  metoprolol  tartrate (LOPRESSOR ) 25 MG tablet TAKE 1/2 TABLET TWICE A DAY BY MOUTH 09/24/23   Katrinka Garnette KIDD, MD  pantoprazole  (PROTONIX ) 40 MG tablet TAKE 1 TABLET BY MOUTH TWICE A DAY 08/03/23   Katrinka Garnette KIDD, MD  polyethylene glycol (  MIRALAX  / GLYCOLAX ) 17 g packet Take 17 g by mouth daily as needed for moderate constipation (mix and drink as directed).    [provider]  prochlorperazine  (COMPAZINE ) 10 MG tablet Take 1 tablet (10 mg total) by mouth every 8 (eight) hours as needed for nausea or vomiting (take with 12.5mg  benadryl  please). 10/27/22   Small, Brooke L, PA  SYSTANE BALANCE 0.6 % SOLN Apply to eye. 11/11/22   [provider]  THERATEARS NIGHTTIME 1 % GEL Apply to eye. 11/11/22   [provider]  thiamine  (VITAMIN B1) 100 MG tablet Take 1 tablet (100 mg total) by mouth daily. 11/10/22   Amin, Ankit C, MD  VIGAMOX 0.5 %  ophthalmic solution Place 1 drop into both eyes See admin instructions. Instill every 4 weeks as directed - use with avastin , instill 1 drop into both eyes 4 times daily for 2 days after eye injections 05/28/14   [provider]    Allergies: Tape, Aspirin , Percocet [oxycodone -acetaminophen ], and Vitamins [apatate]    Review of Systems  Constitutional:  Negative for chills and fever.  Cardiovascular:  Negative for chest pain and palpitations.  Gastrointestinal:  Positive for constipation. Negative for abdominal pain, blood in stool, diarrhea, nausea and vomiting.  Genitourinary:  Negative for dysuria, flank pain and hematuria.  Neurological:  Negative for dizziness and headaches.  Psychiatric/Behavioral:  Positive for confusion.     Updated Vital Signs BP (!) 183/111   Pulse 70   Temp 98.3 F (36.8 C) (Oral)   Resp 16   SpO2 95%   Physical Exam Constitutional:      Appearance: Normal appearance.  HENT:     Head: Normocephalic and atraumatic.  Eyes:     Extraocular Movements: Extraocular movements intact.     Pupils: Pupils are equal, round, and reactive to light.  Cardiovascular:     Rate and Rhythm: Normal rate and regular rhythm.  Pulmonary:     Effort: Pulmonary effort is normal.     Breath sounds: Normal breath sounds.  Abdominal:     General: Abdomen is flat. Bowel sounds are normal. There is no distension.     Palpations: Abdomen is soft.     Tenderness: There is no abdominal tenderness.  Skin:    General: Skin is warm and dry.  Neurological:     Mental Status: He is alert and oriented to person, place, and time.     Comments: Oriented to year but not to month   Psychiatric:        Mood and Affect: Mood normal.        Behavior: Behavior normal.     (all labs ordered are listed, but only abnormal results are displayed) Labs Reviewed  URINALYSIS, ROUTINE W REFLEX MICROSCOPIC - Abnormal; Notable for the following components:      Result Value    APPearance CLOUDY (*)    Hgb urine dipstick MODERATE (*)    Protein, ur 100 (*)    Nitrite POSITIVE (*)    Leukocytes,Ua LARGE (*)    Bacteria, UA MANY (*)    All other components within normal limits  BASIC METABOLIC PANEL WITH GFR - Abnormal; Notable for the following components:   Glucose, Bld 113 (*)    All other components within normal limits  CBC - Abnormal; Notable for the following components:   Platelets 143 (*)    All other components within normal limits  URINE CULTURE    EKG: None  Radiology: No  results found.   Procedures   Medications Ordered in the ED  losartan  (COZAAR ) tablet 25 mg (25 mg Oral Given 06/01/24 1031)                                    Medical Decision Making Patient brought in by EMS for difficulty with bowel movement. Differential diagnosis includes UTI vs bowel obstruction vs functional constipation vs inadequate fluid intake. BP elevated to 202/110 when being assessed. Gave home losartan  as patient had not taken his antihypertensives this morning. CBC, BMP, UA and culture ordered to r/o UTI due to indwelling catheter. More likely due to patient's recurrent benzodiazepine and baseline dementia. Will get CT Abd to r/o any bowel obstruction.   UA positive for infection, with large leukocytes, moderate blood, and positive nitrites. CMP and lipase normal. CBC shows no leukocytosis, and patient is HDS. Low suspicion for sepsis. Pending urine culture. Pending CT Abd Pelv results. Will start broad spectrum antibiotics, which can be continued outpatient.   CT Abd and Pelv positive for moderate stool and moderate prostate enlargement. Negative for bowel perforation. Urine culture still pending, but will discharge patient on Keflex  500mg  TID x 10days. Spoke with son, who is his caregiver about the antibiotics and an updated Miralax  regimen to help the patient have a bowel movement. Patient's son voiced understanding.   Amount and/or Complexity of Data  Reviewed Labs: ordered. Radiology: ordered.  Risk Prescription drug management.      Final diagnoses:  None    ED Discharge Orders     None          Jessic Standifer, DO 06/01/24 1331    Dreama Longs, MD 06/01/24 2227

## 2024-06-01 NOTE — ED Notes (Signed)
 Placed pt on bedpan for BM,  pt was unsuccessful,  this RN removed bed pan and advised pt to use call bell if he needs the bedpan again

## 2024-06-05 LAB — URINE CULTURE: Culture: >=100000 — AB

## 2024-06-06 ENCOUNTER — Telehealth (HOSPITAL_BASED_OUTPATIENT_CLINIC_OR_DEPARTMENT_OTHER): Payer: Self-pay | Admitting: *Deleted

## 2024-06-06 DIAGNOSIS — C4441 Basal cell carcinoma of skin of scalp and neck: Secondary | ICD-10-CM | POA: Diagnosis not present

## 2024-06-06 NOTE — Progress Notes (Signed)
 ED Antimicrobial Stewardship Positive Culture Follow Up   Joseph Simpson is an 89 y.o. male who presented to [facility] on 06/01/2024 with a chief complaint of  Chief Complaint  Patient presents with   Urinary Retention    Recent Results (from the past 720 hours)  Culture, Urine (Do not remove urinary catheter, catheter placed by urology or difficult to place)     Status: Abnormal   Collection Time: 06/01/24  8:50 AM   Specimen: Urine, Catheterized  Result Value Ref Range Status   Specimen Description   Final    URINE, CATHETERIZED Performed at [facility], [address], [place], [place] [address]    Special Requests   Final    NONE Performed at [facility], [address], [place], [place] [address]    Culture >=100,000 COLONIES/mL CITROBACTER BRAAKII (A)  Final   Report Status 06/05/2024 FINAL  Final   Organism ID, Bacteria CITROBACTER BRAAKII (A)  Final      Susceptibility   Citrobacter braakii - MIC*    CEFEPIME <=0.12 SENSITIVE Sensitive     CEFTRIAXONE  0.5 SENSITIVE Sensitive     CIPROFLOXACIN  <=0.25 SENSITIVE Sensitive     GENTAMICIN  <=1 SENSITIVE Sensitive     IMIPENEM 0.5 SENSITIVE Sensitive     NITROFURANTOIN 64 INTERMEDIATE Intermediate     TRIMETH /SULFA  <=20 SENSITIVE Sensitive     PIP/TAZO <=4 SENSITIVE Sensitive ug/mL    * >=100,000 COLONIES/mL CITROBACTER BRAAKII    [x]  Treated with cephalexin , organism resistant to prescribed antimicrobial  89 YOM who presented on 7/17 with a myriad of symptoms including inability to have a bowel movement, SOB, agitation. The patient has a indwelling foley recent exchanged earlier in the week. UA/culture appears to have been pulled from the foley and pyuria, +nitrite, many bacteria however no systemic signs (Afeb, WBC 6.6)  Call the patient for a symptom check and to STOP cephalexin  - If improved/resolving - no additional antibiotics indicated - If specifically with urinary  symptoms (burning, discomfort, etc) then start Bactrim  DS 1 tab bid x 3d  ED Provider: Darice Showers, PA-C  Thank you for allowing pharmacy to be a part of this patient's care.  Almarie Lunger, PharmD, BCPS, BCIDP Infectious Diseases Clinical Pharmacist 06/06/2024 8:54 AM   **Pharmacist phone directory can now be found on [email or web address] ([email or web address]).  Listed under [facility].

## 2024-06-06 NOTE — Telephone Encounter (Signed)
 Post ED Visit - Positive Culture Follow-up  Culture report reviewed by antimicrobial stewardship pharmacist: [facility] []  Rankin Dee, Pharm.D. []  Venetia Gully, Pharm.D., BCPS AQ-ID []  Garrel Crews, Pharm.D., BCPS [x]  Almarie Lunger, Pharm.D., BCPS []  [place], [address].D., BCPS, AAHIVP []  Rosaline Bihari, Pharm.D., BCPS, AAHIVP []  Vernell Meier, PharmD, BCPS []  Latanya Hint, PharmD, BCPS []  Donald Medley, PharmD, BCPS []  Rocky Bold, PharmD []  Dorothyann Alert, PharmD, BCPS []  Morene Babe, PharmD  [facility] []  Rosaline Edison, PharmD []  Romona Bliss, PharmD []  Dolphus Roller, PharmD []  Veva Seip, Rph []  Vernell Daunt) Leonce, PharmD []  Eva Allis, PharmD []  Rosaline Millet, PharmD []  Iantha Batch, PharmD []  Arvin Gauss, PharmD []  Wanda Hasting, PharmD []  Ronal Rav, PharmD []  Rocky Slade, PharmD []  Bard Jeans, PharmD   Positive urine culture Treated with cephalexin  based on +UA (pulled from foley?).  No specific urinary sx noted, and pt's son denies current sx during today's phone conversation.  Plan per Saddie Showers, PA-C: stop cephalexin  if sx improved/resolved.  Pt's son, Demarkus verbalized understanding of the plan.  No further patient follow-up is required at this time.  Lorita Barnie Pereyra 06/06/2024, 10:44 AM

## 2024-06-23 ENCOUNTER — Encounter (INDEPENDENT_AMBULATORY_CARE_PROVIDER_SITE_OTHER): Admitting: Ophthalmology

## 2024-06-23 DIAGNOSIS — I10 Essential (primary) hypertension: Secondary | ICD-10-CM

## 2024-06-23 DIAGNOSIS — H353231 Exudative age-related macular degeneration, bilateral, with active choroidal neovascularization: Secondary | ICD-10-CM

## 2024-06-23 DIAGNOSIS — H35033 Hypertensive retinopathy, bilateral: Secondary | ICD-10-CM | POA: Diagnosis not present

## 2024-06-23 DIAGNOSIS — H43813 Vitreous degeneration, bilateral: Secondary | ICD-10-CM

## 2024-06-27 DIAGNOSIS — Z5189 Encounter for other specified aftercare: Secondary | ICD-10-CM | POA: Diagnosis not present

## 2024-06-28 DIAGNOSIS — N312 Flaccid neuropathic bladder, not elsewhere classified: Secondary | ICD-10-CM | POA: Diagnosis not present

## 2024-07-21 DIAGNOSIS — N312 Flaccid neuropathic bladder, not elsewhere classified: Secondary | ICD-10-CM | POA: Diagnosis not present

## 2024-07-21 DIAGNOSIS — R310 Gross hematuria: Secondary | ICD-10-CM | POA: Diagnosis not present

## 2024-07-21 DIAGNOSIS — R338 Other retention of urine: Secondary | ICD-10-CM | POA: Diagnosis not present

## 2024-07-28 DIAGNOSIS — N312 Flaccid neuropathic bladder, not elsewhere classified: Secondary | ICD-10-CM | POA: Diagnosis not present

## 2024-07-28 DIAGNOSIS — R3914 Feeling of incomplete bladder emptying: Secondary | ICD-10-CM | POA: Diagnosis not present

## 2024-08-03 ENCOUNTER — Encounter (INDEPENDENT_AMBULATORY_CARE_PROVIDER_SITE_OTHER): Admitting: Ophthalmology

## 2024-08-03 DIAGNOSIS — H35033 Hypertensive retinopathy, bilateral: Secondary | ICD-10-CM | POA: Diagnosis not present

## 2024-08-03 DIAGNOSIS — H353231 Exudative age-related macular degeneration, bilateral, with active choroidal neovascularization: Secondary | ICD-10-CM

## 2024-08-03 DIAGNOSIS — H33301 Unspecified retinal break, right eye: Secondary | ICD-10-CM

## 2024-08-03 DIAGNOSIS — H43813 Vitreous degeneration, bilateral: Secondary | ICD-10-CM | POA: Diagnosis not present

## 2024-08-03 DIAGNOSIS — I10 Essential (primary) hypertension: Secondary | ICD-10-CM

## 2024-08-14 DIAGNOSIS — N312 Flaccid neuropathic bladder, not elsewhere classified: Secondary | ICD-10-CM | POA: Diagnosis not present

## 2024-08-17 DIAGNOSIS — M216X9 Other acquired deformities of unspecified foot: Secondary | ICD-10-CM | POA: Diagnosis not present

## 2024-08-17 DIAGNOSIS — B351 Tinea unguium: Secondary | ICD-10-CM | POA: Diagnosis not present

## 2024-08-17 DIAGNOSIS — M19079 Primary osteoarthritis, unspecified ankle and foot: Secondary | ICD-10-CM | POA: Diagnosis not present

## 2024-08-17 DIAGNOSIS — M2041 Other hammer toe(s) (acquired), right foot: Secondary | ICD-10-CM | POA: Diagnosis not present

## 2024-08-17 DIAGNOSIS — I739 Peripheral vascular disease, unspecified: Secondary | ICD-10-CM | POA: Diagnosis not present

## 2024-08-17 DIAGNOSIS — L818 Other specified disorders of pigmentation: Secondary | ICD-10-CM | POA: Diagnosis not present

## 2024-08-21 DIAGNOSIS — N200 Calculus of kidney: Secondary | ICD-10-CM | POA: Diagnosis not present

## 2024-08-21 DIAGNOSIS — K5730 Diverticulosis of large intestine without perforation or abscess without bleeding: Secondary | ICD-10-CM | POA: Diagnosis not present

## 2024-08-21 DIAGNOSIS — N210 Calculus in bladder: Secondary | ICD-10-CM | POA: Diagnosis not present

## 2024-08-21 DIAGNOSIS — R310 Gross hematuria: Secondary | ICD-10-CM | POA: Diagnosis not present

## 2024-09-02 ENCOUNTER — Other Ambulatory Visit: Payer: Self-pay | Admitting: Family Medicine

## 2024-09-04 ENCOUNTER — Encounter: Payer: Self-pay | Admitting: Family Medicine

## 2024-09-04 MED ORDER — LORAZEPAM 1 MG PO TABS: ORAL_TABLET | ORAL | 2 refills | Status: AC

## 2024-09-05 DIAGNOSIS — R339 Retention of urine, unspecified: Secondary | ICD-10-CM | POA: Diagnosis not present

## 2024-09-05 DIAGNOSIS — R399 Unspecified symptoms and signs involving the genitourinary system: Secondary | ICD-10-CM | POA: Diagnosis not present

## 2024-09-09 ENCOUNTER — Emergency Department (HOSPITAL_BASED_OUTPATIENT_CLINIC_OR_DEPARTMENT_OTHER)
Admission: EM | Admit: 2024-09-09 | Discharge: 2024-09-09 | Disposition: A | Discharge status: Home / Self Care | Attending: Emergency Medicine | Admitting: Emergency Medicine

## 2024-09-09 ENCOUNTER — Encounter (HOSPITAL_BASED_OUTPATIENT_CLINIC_OR_DEPARTMENT_OTHER): Payer: Self-pay

## 2024-09-09 ENCOUNTER — Other Ambulatory Visit: Payer: Self-pay

## 2024-09-09 DIAGNOSIS — R339 Retention of urine, unspecified: Secondary | ICD-10-CM | POA: Diagnosis not present

## 2024-09-09 DIAGNOSIS — N3000 Acute cystitis without hematuria: Secondary | ICD-10-CM | POA: Diagnosis not present

## 2024-09-09 DIAGNOSIS — Z7901 Long term (current) use of anticoagulants: Secondary | ICD-10-CM | POA: Insufficient documentation

## 2024-09-09 DIAGNOSIS — F0390 Unspecified dementia without behavioral disturbance: Secondary | ICD-10-CM | POA: Insufficient documentation

## 2024-09-09 LAB — URINALYSIS, W/ REFLEX TO CULTURE (INFECTION SUSPECTED)
Bilirubin Urine: NEGATIVE
Glucose, UA: NEGATIVE mg/dL
Ketones, ur: NEGATIVE mg/dL
Nitrite: POSITIVE — AB
Protein, ur: 30 mg/dL — AB
RBC / HPF: >50 RBC/hpf (ref 0–5)
Specific Gravity, Urine: 1.021 (ref 1.005–1.030)
WBC, UA: >50 WBC/hpf (ref 0–5)
pH: 6.5 (ref 5.0–8.0)

## 2024-09-09 MED ORDER — CEPHALEXIN 250 MG PO CAPS
500.0000 mg | ORAL_CAPSULE | Freq: Once | ORAL | Status: AC
  Administered 2024-09-09: 500 mg via ORAL
  Filled 2024-09-09: qty 2

## 2024-09-09 MED ORDER — CEPHALEXIN 500 MG PO CAPS: 500.0000 mg | ORAL_CAPSULE | Freq: Two times a day (BID) | ORAL | 0 refills | Status: DC

## 2024-09-09 NOTE — Discharge Instructions (Addendum)
Follow-up with your doctor on Monday as discussed.  Return to the emergency room if you have any worsening symptoms.

## 2024-09-09 NOTE — ED Triage Notes (Addendum)
 Urinary retention x24 hours. Urinary catheter in place. Daughter that is a nurse attempted to flush it yesterday and she had a hard time.  Bladder scan 249 mL

## 2024-09-09 NOTE — ED Provider Notes (Signed)
 [place] EMERGENCY DEPARTMENT AT [facility] Provider Note   CSN: [id number] Arrival date & time: 09/09/24  1726     Patient presents with: Urinary Retention   Joseph Simpson is a 89 y.o. male.   Patient is a 89 year old male with a history of dementia who has a chronic indwelling Foley catheter that has not been draining since last night.  He is here with his son who helps provide history.  His son said he emptied it before he went to bed last night but it really has not had much urine in the bag of all through the day.  They are concerned that is not draining.  He has been complaining of some pain in his bladder area which has happened before when his catheter has gotten clogged.  He has not had any fevers.  No nausea or vomiting.  No increased confusion.       Prior to Admission medications   Medication Sig Start Date End Date Taking? Authorizing Provider  cephALEXin  (KEFLEX ) 500 MG capsule Take 1 capsule (500 mg total) by mouth 2 (two) times daily. 09/09/24  Yes Lenor Hollering, MD  acetaminophen  (TYLENOL ) 500 MG tablet Take 500-1,000 mg by mouth 2 (two) times daily as needed for mild pain, moderate pain or headache.    [provider]  apixaban  (ELIQUIS ) 5 MG TABS tablet Take 1 tablet (5 mg total) by mouth 2 (two) times daily. 09/24/23   Katrinka Garnette KIDD, MD  atorvastatin  (LIPITOR) 20 MG tablet TAKE 1 TABLET BY MOUTH 3 TIMES A WEEK 01/20/24   Katrinka Garnette KIDD, MD  BENADRYL  ALLERGY 25 MG tablet Take 12.5 mg by mouth See admin instructions. Take 12.5 mg by mouth every 8 hours when taking Prochlorperazine     [provider]  bevacizumab  (AVASTIN ) 2.5 mg/0.1 mL SOLN 2.5 mg by Intravitreal route See admin instructions. Injection to eyes every 4 weeks - use with vigamox    [provider]  Cholecalciferol (VITAMIN D3) 1.25 MG (50000 UT) CAPS Take 1 capsule by mouth once a week. 11/22/22   [provider]  cyanocobalamin  (VITAMIN B12) 1000 MCG/ML  injection INJECT 1 ML (1,000 MCG TOTAL) INTO THE MUSCLE EVERY 30 (THIRTY) DAYS. BY GRANDDAUGHTER WHO IS AN RN 01/19/24   Katrinka Garnette KIDD, MD  docusate sodium  (COLACE) 100 MG capsule Take 200 mg by mouth at bedtime.    [provider]  escitalopram  (LEXAPRO ) 10 MG tablet TAKE 1 TABLET BY MOUTH EVERY DAY 03/07/24   Katrinka Garnette KIDD, MD  LORazepam  (ATIVAN ) 1 MG tablet TAKE 1 TAB BY MOUTH 2 TIMES DAILY AS NEEDED FOR ANXIETY*NEEDS TO BE SUPERVISED 8-10HRS AFTER USE-FALL RISK 09/04/24   Katrinka Garnette KIDD, MD  losartan  (COZAAR ) 25 MG tablet TAKE 0.5-1 TAB DAILY. IF BLOOD PRESSURE OVER 145 WHEN YOU CHECK- OK TO TAKE OTHER HALF TABLET ONCE PER DAY (SO TOTAL OF 25MG  PER DAY) 12/07/23   Katrinka Garnette KIDD, MD  metoprolol  tartrate (LOPRESSOR ) 25 MG tablet TAKE 1/2 TABLET TWICE A DAY BY MOUTH 09/24/23   Katrinka Garnette KIDD, MD  pantoprazole  (PROTONIX ) 40 MG tablet TAKE 1 TABLET BY MOUTH TWICE A DAY 08/03/23   Katrinka Garnette KIDD, MD  polyethylene glycol (MIRALAX  / GLYCOLAX ) 17 g packet Take 17 g by mouth daily as needed for moderate constipation (mix and drink as directed).    [provider]  prochlorperazine  (COMPAZINE ) 10 MG tablet Take 1 tablet (10 mg total) by mouth every 8 (eight) hours as  needed for nausea or vomiting (take with 12.5mg  benadryl  please). 10/27/22   Small, Brooke L, PA  SYSTANE BALANCE 0.6 % SOLN Apply to eye. 11/11/22   [provider]  THERATEARS NIGHTTIME 1 % GEL Apply to eye. 11/11/22   [provider]  thiamine  (VITAMIN B1) 100 MG tablet Take 1 tablet (100 mg total) by mouth daily. 11/10/22   Amin, Ankit C, MD  VIGAMOX 0.5 % ophthalmic solution Place 1 drop into both eyes See admin instructions. Instill every 4 weeks as directed - use with avastin , instill 1 drop into both eyes 4 times daily for 2 days after eye injections 05/28/14   [provider]    Allergies: Tape, Aspirin , Percocet [oxycodone -acetaminophen ], and Vitamins [apatate]    Review of  Systems  Unable to perform ROS: Dementia    Updated Vital Signs BP (!) 168/94   Pulse 66   Temp 98.5 F (36.9 C) (Oral)   Resp 16   Ht 6' (1.829 m)   Wt 81.6 kg   SpO2 94%   BMI 24.41 kg/m   Physical Exam Constitutional:      Appearance: He is well-developed.  HENT:     Head: Normocephalic and atraumatic.  Eyes:     Pupils: Pupils are equal, round, and reactive to light.  Cardiovascular:     Rate and Rhythm: Normal rate and regular rhythm.     Heart sounds: Normal heart sounds.  Pulmonary:     Effort: Pulmonary effort is normal. No respiratory distress.     Breath sounds: Normal breath sounds. No wheezing or rales.  Chest:     Chest wall: No tenderness.  Abdominal:     General: Bowel sounds are normal.     Palpations: Abdomen is soft.     Tenderness: There is abdominal tenderness (Positive suprapubic tenderness). There is no guarding or rebound.  Genitourinary:    Comments: Indwelling Foley catheter with scant amount of dark urine in the bag Musculoskeletal:        General: Normal range of motion.     Cervical back: Normal range of motion and neck supple.  Lymphadenopathy:     Cervical: No cervical adenopathy.  Skin:    General: Skin is warm and dry.     Findings: No rash.  Neurological:     Mental Status: He is alert and oriented to person, place, and time.     (all labs ordered are listed, but only abnormal results are displayed) Labs Reviewed  URINALYSIS, W/ REFLEX TO CULTURE (INFECTION SUSPECTED) - Abnormal; Notable for the following components:      Result Value   APPearance CLOUDY (*)    Hgb urine dipstick LARGE (*)    Protein, ur 30 (*)    Nitrite POSITIVE (*)    Leukocytes,Ua LARGE (*)    Bacteria, UA MANY (*)    All other components within normal limits  URINE CULTURE    EKG: None  Radiology: No results found.   Procedures   Medications Ordered in the ED  cephALEXin  (KEFLEX ) capsule 500 mg (has no administration in time range)                                     Medical Decision Making Amount and/or Complexity of Data Reviewed Labs: ordered.  Risk Prescription drug management.   Patient is a 89 year old male who presents with some suprapubic pain and nonfunctioning  Foley catheter.  The Foley catheter was replaced.  His pain resolved.  His urine is draining well now.  Urinalysis is concerning for infection.  I reviewed his chart and his prior cultures grew out Citrobacter which was pansensitive.  Will start him on Keflex .  I initially ordered ordered some blood work.  I wanted to make sure that his kidney function was okay but the family member at bedside who is the patient's son says that they are ready to go.  They have an appointment with his urologist on Monday.  Discontinued blood work.  He was discharged home in good condition.  Return precautions were given.     Final diagnoses:  Urinary retention  Acute cystitis without hematuria    ED Discharge Orders          Ordered    cephALEXin  (KEFLEX ) 500 MG capsule  2 times daily        09/09/24 1930               Lenor Hollering, MD 09/09/24 1933

## 2024-09-11 DIAGNOSIS — R339 Retention of urine, unspecified: Secondary | ICD-10-CM | POA: Diagnosis not present

## 2024-09-11 DIAGNOSIS — R310 Gross hematuria: Secondary | ICD-10-CM | POA: Diagnosis not present

## 2024-09-11 DIAGNOSIS — N210 Calculus in bladder: Secondary | ICD-10-CM | POA: Diagnosis not present

## 2024-09-11 LAB — URINE CULTURE

## 2024-09-14 ENCOUNTER — Encounter (INDEPENDENT_AMBULATORY_CARE_PROVIDER_SITE_OTHER): Admitting: Ophthalmology

## 2024-09-14 ENCOUNTER — Encounter: Payer: Self-pay | Admitting: Family Medicine

## 2024-09-14 ENCOUNTER — Emergency Department (HOSPITAL_BASED_OUTPATIENT_CLINIC_OR_DEPARTMENT_OTHER)
Admission: EM | Admit: 2024-09-14 | Discharge: 2024-09-14 | Disposition: A | Discharge status: Home / Self Care | Attending: Emergency Medicine | Admitting: Emergency Medicine

## 2024-09-14 ENCOUNTER — Telehealth: Payer: Self-pay | Admitting: Family Medicine

## 2024-09-14 ENCOUNTER — Emergency Department (HOSPITAL_BASED_OUTPATIENT_CLINIC_OR_DEPARTMENT_OTHER)

## 2024-09-14 ENCOUNTER — Encounter (INDEPENDENT_AMBULATORY_CARE_PROVIDER_SITE_OTHER): Payer: Self-pay

## 2024-09-14 ENCOUNTER — Other Ambulatory Visit: Payer: Self-pay

## 2024-09-14 DIAGNOSIS — Z7901 Long term (current) use of anticoagulants: Secondary | ICD-10-CM | POA: Diagnosis not present

## 2024-09-14 DIAGNOSIS — R0789 Other chest pain: Secondary | ICD-10-CM | POA: Diagnosis not present

## 2024-09-14 DIAGNOSIS — Z79899 Other long term (current) drug therapy: Secondary | ICD-10-CM | POA: Diagnosis not present

## 2024-09-14 DIAGNOSIS — N3090 Cystitis, unspecified without hematuria: Secondary | ICD-10-CM | POA: Diagnosis not present

## 2024-09-14 DIAGNOSIS — I10 Essential (primary) hypertension: Secondary | ICD-10-CM | POA: Diagnosis not present

## 2024-09-14 DIAGNOSIS — R072 Precordial pain: Secondary | ICD-10-CM | POA: Diagnosis not present

## 2024-09-14 DIAGNOSIS — I2510 Atherosclerotic heart disease of native coronary artery without angina pectoris: Secondary | ICD-10-CM | POA: Diagnosis not present

## 2024-09-14 DIAGNOSIS — R319 Hematuria, unspecified: Secondary | ICD-10-CM | POA: Diagnosis present

## 2024-09-14 DIAGNOSIS — I7090 Unspecified atherosclerosis: Secondary | ICD-10-CM | POA: Diagnosis not present

## 2024-09-14 DIAGNOSIS — R918 Other nonspecific abnormal finding of lung field: Secondary | ICD-10-CM | POA: Diagnosis not present

## 2024-09-14 DIAGNOSIS — Z471 Aftercare following joint replacement surgery: Secondary | ICD-10-CM | POA: Diagnosis not present

## 2024-09-14 DIAGNOSIS — I4891 Unspecified atrial fibrillation: Secondary | ICD-10-CM | POA: Diagnosis not present

## 2024-09-14 DIAGNOSIS — J90 Pleural effusion, not elsewhere classified: Secondary | ICD-10-CM | POA: Diagnosis not present

## 2024-09-14 LAB — URINALYSIS, W/ REFLEX TO CULTURE (INFECTION SUSPECTED)
Bilirubin Urine: NEGATIVE
Glucose, UA: NEGATIVE mg/dL
Ketones, ur: NEGATIVE mg/dL
Nitrite: POSITIVE — AB
Protein, ur: 100 mg/dL — AB
RBC / HPF: >50 RBC/hpf (ref 0–5)
Specific Gravity, Urine: 1.02 (ref 1.005–1.030)
WBC, UA: >50 WBC/hpf (ref 0–5)
pH: 6 (ref 5.0–8.0)

## 2024-09-14 LAB — CBC
HCT: 46.4 % (ref 39.0–52.0)
Hemoglobin: 15.6 g/dL (ref 13.0–17.0)
MCH: 30.9 pg (ref 26.0–34.0)
MCHC: 33.6 g/dL (ref 30.0–36.0)
MCV: 91.9 fL (ref 80.0–100.0)
Platelets: 168 K/uL (ref 150–400)
RBC: 5.05 MIL/uL (ref 4.22–5.81)
RDW: 13 % (ref 11.5–15.5)
WBC: 6.5 K/uL (ref 4.0–10.5)
nRBC: 0 % (ref 0.0–0.2)

## 2024-09-14 LAB — BASIC METABOLIC PANEL WITH GFR
Anion gap: 11 (ref 5–15)
BUN: 12 mg/dL (ref 8–23)
CO2: 26 mmol/L (ref 22–32)
Calcium: 9.8 mg/dL (ref 8.9–10.3)
Chloride: 104 mmol/L (ref 98–111)
Creatinine, Ser: 0.82 mg/dL (ref 0.61–1.24)
GFR, Estimated: >60 mL/min (ref >60)
Glucose, Bld: 120 mg/dL — ABNORMAL HIGH (ref 70–99)
Potassium: 4 mmol/L (ref 3.5–5.1)
Sodium: 141 mmol/L (ref 135–145)

## 2024-09-14 LAB — TROPONIN T, HIGH SENSITIVITY
Troponin T High Sensitivity: <15 ng/L (ref 0–19)
Troponin T High Sensitivity: <15 ng/L (ref 0–19)

## 2024-09-14 MED ORDER — ASPIRIN 81 MG PO CHEW: 324.0000 mg | CHEWABLE_TABLET | Freq: Once | ORAL | Status: DC

## 2024-09-14 MED ORDER — NITROGLYCERIN 0.4 MG SL SUBL
0.4000 mg | SUBLINGUAL_TABLET | SUBLINGUAL | Status: DC | PRN
  Administered 2024-09-14 (×2): 0.4 mg via SUBLINGUAL
  Filled 2024-09-14: qty 1

## 2024-09-14 MED ORDER — IOHEXOL 350 MG/ML SOLN
100.0000 mL | Freq: Once | INTRAVENOUS | Status: AC | PRN
  Administered 2024-09-14: 100 mL via INTRAVENOUS

## 2024-09-14 MED ORDER — CEPHALEXIN 500 MG PO CAPS: 500.0000 mg | ORAL_CAPSULE | Freq: Three times a day (TID) | ORAL | 0 refills | Status: AC

## 2024-09-14 MED ORDER — SODIUM CHLORIDE 0.9 % IV SOLN
2.0000 g | Freq: Once | INTRAVENOUS | Status: AC
  Administered 2024-09-14: 2 g via INTRAVENOUS
  Filled 2024-09-14: qty 20

## 2024-09-14 NOTE — ED Notes (Signed)
 Patient states pain was initially relieved by the nitroglycerin  but returned when he was moving around for the CT scan.

## 2024-09-14 NOTE — ED Notes (Signed)
 Catheter removed and replaced per Dr orders.

## 2024-09-14 NOTE — Telephone Encounter (Signed)
 FYI. We can look over form when it comes in.   Copied from CRM [id number]. Topic: General - Other >> Sep 14, 2024  3:31 PM Mia F wrote: Reason for CRM: Pt daughter in law called to ask if Dr Katrinka can [facility] form out for pt. She says it is an emergency. Her husband is going to  bring the form over CAL states they are not sure if it can be signed or not but he can bring form over

## 2024-09-14 NOTE — Discharge Instructions (Addendum)
 [place] JONELLE Sayres  Thank you for allowing us  to take care of you today.  You came to the Emergency Department today because you are having chest pain, and your son was concerned that your pain could be potentially related to your kidney stones.  Here in the emergency department we got labs as well as imaging that did not show any tear in the large blood vessel in your heart, did not show any elevation of your heart numbers, therefore we do not think that your pain is due to your heart.  Your labs did show that you have a urine infection, we put in a fresh Foley catheter and gave you a dose of ceftriaxone .  We spoke with urology and make sure that they did not have any plans for any stone removal, and they do not currently have any.   You were recently prescribed an antibiotic Keflex  to take twice daily.  We are lengthening the dosage of this medication to an additional 5 days, only want you to take it 3 times a day rather than twice a day (we sent in a refill of this medication to your preferred pharmacy)   To-Do: 1. Please follow-up with your primary doctor within 1 - 2 weeks / as soon as possible.   Please return to the Emergency Department or call 911 if you experience have worsening of your symptoms, or do not get better, inability to drain urine from your catheter, chest pain, shortness of breath, severe or significantly worsening pain, high fever, severe confusion, pass out or have any reason to think that you need emergency medical care.   We hope you feel better soon.   Mitzie Later, MD Department of Emergency Medicine [facility]

## 2024-09-14 NOTE — ED Triage Notes (Addendum)
 Patient states chest pain since this morning. Son states he has been complaining of chest pain for 2 days. Hx of dementia. Hx provided by son at bedside.

## 2024-09-14 NOTE — Telephone Encounter (Signed)
 The form was dropped off today at 4:05pm. I told the gentleman that dropped it off it would be ready tomorrow due to us  still seeing patients in clinic.

## 2024-09-14 NOTE — ED Provider Notes (Signed)
 [place] [facility] AT [facility] Provider Note   CSN: [id number] Arrival date & time: 09/14/24  9164     History Chief Complaint  Patient presents with   Chest Pain     Chest Pain  HPI: Joseph Simpson is a 89 y.o. male with history perinent CAD, HTN, HLD, A-fib, esophagitis, GERD, nephrolithiasis, OSA, dementia who presents complaining of chest pain. Patient arrived via POV accompanied by son, Lyndy.  History provided by patient and relative: Son.  No interpreter required during this encounter.  Patient reports that he was in his normal state of health last night, reports that since this morning he has had severe substernal chest pressure which radiates to his back as well as his abdomen.  Worsens with movement, partially alleviated with rest.  Denies fever, chills, shortness of breath, nausea, vomiting, diarrhea.  Son states that he believes that symptoms are due to nephrolithiasis, reports that patient has had trouble with nephrolithiasis, and request that we speak with [facility] to have the patient scheduled for a emergent stone removal, which she reports that he has been attempting to schedule for 2 days, however has had difficulty getting the urology office to call him back.  Also reports that patient has had intermittent hematuria per his Foley catheter, and for drainage from the Foley catheter.  Patient's recorded medical, surgical, social, medication list and allergies were reviewed in the Snapshot window as part of the initial history.   Prior to Admission medications   Medication Sig Start Date End Date Taking? Authorizing Provider  memantine (NAMENDA) 5 MG tablet Take 1 tablet by mouth 2 (two) times daily. 01/11/23  Yes [provider]  acetaminophen  (TYLENOL ) 500 MG tablet Take 500-1,000 mg by mouth 2 (two) times daily as needed for mild pain, moderate pain or headache.    [provider]  apixaban  (ELIQUIS ) 5 MG TABS tablet  Take 1 tablet (5 mg total) by mouth 2 (two) times daily. 09/24/23   Katrinka Garnette KIDD, MD  atorvastatin  (LIPITOR) 20 MG tablet TAKE 1 TABLET BY MOUTH 3 TIMES A WEEK 01/20/24   Katrinka Garnette KIDD, MD  BENADRYL  ALLERGY 25 MG tablet Take 12.5 mg by mouth See admin instructions. Take 12.5 mg by mouth every 8 hours when taking Prochlorperazine     [provider]  bevacizumab  (AVASTIN ) 2.5 mg/0.1 mL SOLN 2.5 mg by Intravitreal route See admin instructions. Injection to eyes every 4 weeks - use with vigamox    [provider]  cephALEXin  (KEFLEX ) 500 MG capsule Take 1 capsule (500 mg total) by mouth 3 (three) times daily for 5 days. 09/14/24 09/19/24  Rogelia Jerilynn RAMAN, MD  Cholecalciferol (VITAMIN D3) 1.25 MG (50000 UT) CAPS Take 1 capsule by mouth once a week. 11/22/22   [provider]  cyanocobalamin  (VITAMIN B12) 1000 MCG/ML injection INJECT 1 ML (1,000 MCG TOTAL) INTO THE MUSCLE EVERY 30 (THIRTY) DAYS. BY GRANDDAUGHTER WHO IS AN RN 01/19/24   Katrinka Garnette KIDD, MD  docusate sodium  (COLACE) 100 MG capsule Take 200 mg by mouth at bedtime.    [provider]  escitalopram  (LEXAPRO ) 10 MG tablet TAKE 1 TABLET BY MOUTH EVERY DAY 03/07/24   Katrinka Garnette KIDD, MD  LORazepam  (ATIVAN ) 1 MG tablet TAKE 1 TAB BY MOUTH 2 TIMES DAILY AS NEEDED FOR ANXIETY*NEEDS TO BE SUPERVISED 8-10HRS AFTER USE-FALL RISK 09/04/24   Katrinka Garnette KIDD, MD  losartan  (COZAAR ) 25 MG tablet TAKE 0.5-1 TAB DAILY. IF BLOOD PRESSURE OVER  145 WHEN YOU CHECK- OK TO TAKE OTHER HALF TABLET ONCE PER DAY (SO TOTAL OF 25MG  PER DAY) 12/07/23   Katrinka Garnette KIDD, MD  metoprolol  tartrate (LOPRESSOR ) 25 MG tablet TAKE 1/2 TABLET TWICE A DAY BY MOUTH 09/24/23   Katrinka Garnette KIDD, MD  pantoprazole  (PROTONIX ) 40 MG tablet TAKE 1 TABLET BY MOUTH TWICE A DAY 08/03/23   Katrinka Garnette KIDD, MD  polyethylene glycol (MIRALAX  / GLYCOLAX ) 17 g packet Take 17 g by mouth daily as needed for moderate constipation (mix and drink as directed).     [provider]  prochlorperazine  (COMPAZINE ) 10 MG tablet Take 1 tablet (10 mg total) by mouth every 8 (eight) hours as needed for nausea or vomiting (take with 12.5mg  benadryl  please). 10/27/22   Small, Brooke L, PA  SYSTANE BALANCE 0.6 % SOLN Apply to eye. 11/11/22   [provider]  THERATEARS NIGHTTIME 1 % GEL Apply to eye. 11/11/22   [provider]  thiamine  (VITAMIN B1) 100 MG tablet Take 1 tablet (100 mg total) by mouth daily. 11/10/22   Amin, Ankit C, MD  VIGAMOX 0.5 % ophthalmic solution Place 1 drop into both eyes See admin instructions. Instill every 4 weeks as directed - use with avastin , instill 1 drop into both eyes 4 times daily for 2 days after eye injections 05/28/14   [provider]     Allergies: Tape, Aspirin , Percocet [oxycodone -acetaminophen ], and Vitamins [apatate]   Review of Systems   ROS as per HPI  Physical Exam Updated Vital Signs BP (!) 157/91   Pulse (!) 59   Temp 98.1 F (36.7 C) (Oral)   Resp (!) 22   SpO2 98%  Physical Exam Vitals and nursing note reviewed.  Constitutional:      General: He is not in acute distress.    Appearance: He is well-developed.  HENT:     Head: Normocephalic and atraumatic.  Eyes:     Conjunctiva/sclera: Conjunctivae normal.  Cardiovascular:     Rate and Rhythm: Normal rate and regular rhythm.     Heart sounds: No murmur heard. Pulmonary:     Effort: Pulmonary effort is normal. No respiratory distress.     Breath sounds: Normal breath sounds.  Abdominal:     Palpations: Abdomen is soft.     Tenderness: There is no abdominal tenderness. There is no right CVA tenderness or left CVA tenderness.  Genitourinary:    Comments: Foley catheter in place draining dark yellow urine Musculoskeletal:        General: No swelling.     Cervical back: Neck supple.     Right lower leg: No tenderness. No edema.     Left lower leg: No edema.  Skin:    General: Skin is warm and dry.      Capillary Refill: Capillary refill takes less than 2 seconds.  Neurological:     Mental Status: He is alert.  Psychiatric:        Mood and Affect: Mood normal.     ED Course/ Medical Decision Making/ A&P  Procedures Procedures   Medications Ordered in ED Medications  iohexol  (OMNIPAQUE ) 350 MG/ML injection 100 mL (100 mLs Intravenous Contrast Given 09/14/24 1032)  cefTRIAXone  (ROCEPHIN ) 2 g in sodium chloride  0.9 % 100 mL IVPB (0 g Intravenous Stopped 09/14/24 1308)    Medical Decision Making:   KIVEN VANGILDER is a 89 y.o. male who presents for chest pain as per above.  Physical exam is pertinent for  no focal abnormality.   The differential includes but is not limited to ACS, arrhythmia, pericardial tamponade, pericarditis, myocarditis, pneumonia, pneumothorax, esophageal, tear, perforated abdominal viscous, pulmonary embolism, aortic dissection, costochondritis, musculoskeletal chest wall pain, GERD.  Independent historian: Relative: Son  External data reviewed: Notes: Reviewed patient's recent ED note from 10/25 where he was diagnosed with a UTI and discharged on Keflex   Labs: Ordered, Independent interpretation, and Details: CBC without leukocytosis, anemia, thrombocytopenia.  BMP without AKI or emergent electrolyte derangement.  UA concerning for UTI with positive nitrites, LE, blood. Initial and delta troponin WNL.  Radiology: Ordered, Independent interpretation, Details: Chest x-ray without focal airspace opacification, cardiomediastinal silhouette gentian, pneumothorax, pleural effusion, bony derangement.  CTA chest abdomen pelvis without dissection flap, hydronephrosis, obstructive bowel gas pattern, focal lung pathology, intra-abdominal mass., and All images reviewed independently.  Agree with radiology report at this time.   CT Angio Chest/Abd/Pel for Dissection W and/or Wo Contrast Result Date: 09/14/2024 CLINICAL DATA:  Acute aortic syndrome suspected. Chest pain and  upper abdominal pain. EXAM: CT ANGIOGRAPHY CHEST, ABDOMEN AND PELVIS TECHNIQUE: Non-contrast CT of the chest was initially obtained. Multidetector CT imaging through the chest, abdomen and pelvis was performed using the standard protocol during bolus administration of intravenous contrast. Multiplanar reconstructed images and MIPs were obtained and reviewed to evaluate the vascular anatomy. RADIATION DOSE REDUCTION: This exam was performed according to the departmental dose-optimization program which includes automated exposure control, adjustment of the mA and/or kV according to patient size and/or use of iterative reconstruction technique. CONTRAST:  OMNIPAQUE  IOHEXOL  350 MG/ML SOLN COMPARISON:  CT abdomen/pelvis 06/01/2024 and 08/21/2024, CT chest 06/04/2015 FINDINGS: CTA CHEST FINDINGS Cardiovascular: Mild cardiomegaly. Calcification over the mitral valve annulus minimal calcified plaque over the left anterior descending and lateral circumflex coronary arteries. Thoracic aorta measures 3.7 cm in AP diameter. No evidence of aneurysm or dissection. Bovine morphology of the aortic arch. Minimal calcified plaque over the descending thoracic aorta. Pulmonary arterial system is well opacified without evidence of emboli. Mediastinum/Nodes: No significant mediastinal or hilar adenopathy. There are several calcified mediastinal and hilar lymph nodes. Remaining mediastinal structures are unremarkable. Lungs/Pleura: Lungs are adequately inflated demonstrate a small right pleural effusion. Mild bilateral dependent atelectatic changes present. No focal lobar consolidation. Airways are unremarkable. Musculoskeletal: No focal abnormality. Review of the MIP images confirms the above findings. CTA ABDOMEN AND PELVIS FINDINGS VASCULAR Aorta: Abdominal aorta is normal caliber without evidence of aneurysm or dissection. There is mild calcified plaque throughout the abdominal aorta. Celiac: Patent without evidence of aneurysm,  dissection, vasculitis or significant stenosis. SMA: Patent without evidence of aneurysm, dissection, vasculitis or significant stenosis. Renals: Both renal arteries are patent without evidence of aneurysm, dissection, vasculitis, fibromuscular dysplasia or significant stenosis. IMA: Patent without evidence of aneurysm, dissection, vasculitis or significant stenosis. Inflow: Patent without evidence of aneurysm, dissection, vasculitis or significant stenosis. Veins: No obvious venous abnormality within the limitations of this arterial phase study. Review of the MIP images confirms the above findings. NON-VASCULAR Hepatobiliary: Liver, gallbladder and biliary tree are normal. Pancreas: Normal. Spleen: Normal. Adrenals/Urinary Tract: Adrenal glands are normal. Kidneys are normal in size. Evidence of known bilateral nephrolithiasis with largest stone over the lower pole right kidney measuring 9 mm. No evidence of hydronephrosis. Ureters are unremarkable. Multiple bladder stones unchanged. Foley catheter is present within a decompressed bladder. Stomach/Bowel: Multiple surgical clips over the gastroesophageal junction. Surgical suture line over the stomach. Gastrojejunostomy appears patent. Remaining small bowel is unremarkable. Moderate diverticulosis throughout the  colon without active inflammation. Prior appendectomy. Lymphatic: No adenopathy. Reproductive: Prostate is unremarkable. Other: No significant free fluid or focal inflammatory change. No free peritoneal air. Musculoskeletal: Left total hip arthroplasty intact. No acute fracture. Review of the MIP images confirms the above findings. IMPRESSION: 1. No evidence of thoracic or abdominal aortic aneurysm or dissection. 2. Small right pleural effusion with mild bilateral dependent atelectatic changes. 3. Mild cardiomegaly. Atherosclerotic coronary artery disease. 4. Evidence of known bilateral nephrolithiasis and bladder stones. No evidence of hydronephrosis. 5.  Colonic diverticulosis without active inflammation. 6. Thoracoabdominal aortic atherosclerosis. Aortic Atherosclerosis (ICD10-I70.0). Electronically Signed   By: Toribio Agreste M.D.   On: 09/14/2024 11:14   DG Chest Port 1 View Result Date: 09/14/2024 EXAM: 2 VIEW(S) XRAY OF THE CHEST 09/14/2024 09:23:00 AM COMPARISON: AP/PA and lateral radiographs of the chest dated 05/25/2023. CLINICAL HISTORY: Chest pain FINDINGS: LUNGS AND PLEURA: Trace bilateral pleural effusions. Diffuse interstitial prominence. Bibasilar platelike opacities. No focal pulmonary opacity. No pulmonary edema. No pneumothorax. HEART AND MEDIASTINUM: Cardiomegaly. Atherosclerotic calcifications. Surgical clips over GE junction. BONES AND SOFT TISSUES: Status post right shoulder arthroplasty. No acute osseous abnormality. IMPRESSION: 1. Cardiomegaly and atherosclerotic calcifications. 2. Trace bilateral pleural effusions, diffuse interstitial prominence, and bibasilar platelike opacities. Electronically signed by: Evalene Coho MD 09/14/2024 09:35 AM EDT RP Workstation: [id number]    EKG/Medicine tests: Ordered and Independent interpretation EKG Interpretation Date/Time:  Thursday September 14 2024 08:43:26 EDT Ventricular Rate:  59 PR Interval:  258 QRS Duration:  118 QT Interval:  462 QTC Calculation: 458 R Axis:   76  Text Interpretation: Sinus rhythm Prolonged PR interval Probable left atrial enlargement Incomplete left bundle branch block Left ventricular hypertrophy ST depr, consider ischemia, inferior leads Anterior ST elevation, probably due to LVH On comparison to multiple prior from Dec 2023 - Jul 2025 patient has had inferior ST depressions and anterior ST elevations No significant change since last tracing Confirmed by Rogelia Satterfield ([id number]) on 09/14/2024 8:50:44 AM                Interventions: Nitroglycerin , ceftriaxone   See the EMR for full details regarding lab and imaging results.  Given patient reports that  he has chest pain radiating to his back as well as his abdomen pelvis, I do have suspicion for acute aortic syndrome, therefore do feel that patient warrants a CTA of the chest, abdomen, pelvis.  This was ordered.  Patient does have abnormal EKG with inferior ST depressions as well as anterior ST elevations, potentially LVH.  On review of multiple prior, patient does have similar ST changes in both of these areas, therefore do not feel that this is consistent with STEMI.  Will administer nitroglycerin  note and obtain repeat EKG. this was done and patient did not have any change in symptoms with nitroglycerin , and repeat EKG was unchanged, given chronicity of EKG changes, lack of improvement with nitroglycerin , less likely representative of acute ischemia.  The ECG reveals no anatomical ischemia representing STEMI, New-Onset Arrhythmia, or ischemic equivalent.  He has been risk stratified with a HEAR score of 5. Initial troponin is detectable; delta troponin is detectable.  The patient's presentation, the patient being hemodynamically stable, and the ECG are not consistent with Pericardial Tamponade. The patient's pain is not positional. This in conjunction with the lack of PR depressions and ST elevations on the ECG are reassuring against Pericarditis. The patient's non-elevated troponin and ECG are also inconsistent with Myocarditis.  The CXR is unremarkable for focal airspace disease.  The patient is afebrile and denies productive cough.  Therefore, I do not suspect Pneumonia. There is no evidence of Pneumothorax on physical exam or on the CXR. CXR shows no evidence of Esophageal Tear and there is no recent intractable emesis or esophageal instrumentation. There is no peritonitis or free air on CXR worrisome for a Perforated Abdominal Viscous.  Pulmonary Embolism is on the differential.  CTA does not demonstrate evidence of pulmonary embolism.  Aortic Dissection was considered and CT does not demonstrate  evidence of acute dissection.  On reevaluation, patient reports that his pain resolved spontaneously.  Thus overall feel that there is a low likelihood of emergent pathology causing patient's symptoms.  Given son's concern regarding patient's urologic status, and intermittent clogging of his catheter, catheter was exchanged and UA was obtained from new catheter and does demonstrate evidence of UTI.  Patient otherwise hemodynamically stable, no leukocytosis, no systemic symptoms of infection, therefore doubt sepsis.  Patient is on a course of Keflex , will increase frequency of Keflex  dosing to 3 times daily, and extend duration for 3 days.  Patient was given a dose of ceftriaxone  while in the ED.  Additionally I spoke with Ole Bourdon, NP with urology given son's concern that patient was tried to be scheduled for a urgent urologic procedure/stone removal, upon review of urology notes as well as CT imaging, Sattenfield, NP does not see evidence that patient requires emergent procedure.  Given patient without urinary symptoms, no evidence of obstructing stone on CT, I agree that patient does not need emergent urologic intervention.  Discussed continued outpatient follow-up with regular urologist.  Results and plan were discussed with patient and son at bedside, they were both amenable, and patient was discharged in stable condition.  Presentation is most consistent with acute complicated illness, Current presentation is complicated by underlying chronic conditions, and I did consider and rule out acute life/limb-threatening illness  Discussion of management or test interpretations with external provider(s): S[place]field, NP  Risk Drugs:Prescription drug management  Disposition: DISCHARGE: I believe that the patient is safe for discharge home with outpatient follow-up. Patient was informed of all pertinent physical exam, laboratory, and imaging findings.  Patient's suspected etiology of their symptom  presentation was discussed with the patient and all questions were answered. We discussed following up with PCP and neurology. I provided thorough ED return precautions. The patient feels safe and comfortable with this plan.  MDM generated using voice dictation software and may contain dictation errors.  Please contact me for any clarification or with any questions.  Clinical Impression:  1. Cystitis      Discharge   Final Clinical Impression(s) / ED Diagnoses Final diagnoses:  Cystitis    Rx / DC Orders ED Discharge Orders          Ordered    cephALEXin  (KEFLEX ) 500 MG capsule  3 times daily        09/14/24 1313             Rogelia Jerilynn RAMAN, MD 09/15/24 [phone]

## 2024-09-14 NOTE — Telephone Encounter (Signed)
 We completed this

## 2024-09-14 NOTE — Telephone Encounter (Signed)
 Form given to Dr. Katrinka.   Copied from CRM [id number]. Topic: General - Other >> Sep 14, 2024  3:31 PM Mia F wrote: Reason for CRM: Pt daughter in law called to ask if Dr Katrinka can [facility] form out for pt. She says it is an emergency. Her husband is going to  bring the form over CAL states they are not sure if it can be signed or not but he can bring form over

## 2024-09-15 ENCOUNTER — Other Ambulatory Visit: Payer: Self-pay | Admitting: Urology

## 2024-09-16 LAB — URINE CULTURE: Culture: >=100000 — AB

## 2024-09-17 ENCOUNTER — Telehealth (HOSPITAL_BASED_OUTPATIENT_CLINIC_OR_DEPARTMENT_OTHER): Payer: Self-pay | Admitting: *Deleted

## 2024-09-17 NOTE — Telephone Encounter (Signed)
 Post ED Visit - Positive Culture Follow-up: Unsuccessful Patient Follow-up  Culture assessed and recommendations reviewed by:  [x]  Dorn Poot, Pharm.D. []  Venetia Gully, Pharm.D., BCPS AQ-ID []  Garrel Crews, Pharm.D., BCPS []  Almarie Lunger, Pharm.D., BCPS []  [place], [place].D., BCPS, AAHIVP []  Rosaline Bihari, Pharm.D., BCPS, AAHIVP []  Massie Rigg, PharmD []  Jodie Rower, PharmD, BCPS  Positive urine culture  []  Patient discharged without antimicrobial prescription and treatment is now indicated [x]  Organism is resistant to prescribed ED discharge antimicrobial []  Patient with positive blood cultures  Plan: D/C Keflex  Follow up with Urology per [facility], PA-C  Unable to contact patient , letter will be sent to address on file  Albino Alan Novak 09/17/2024, 4:20 PM

## 2024-09-18 DIAGNOSIS — F03C4 Unspecified dementia, severe, with anxiety: Secondary | ICD-10-CM | POA: Diagnosis not present

## 2024-09-18 DIAGNOSIS — F03C11 Unspecified dementia, severe, with agitation: Secondary | ICD-10-CM | POA: Diagnosis not present

## 2024-09-18 DIAGNOSIS — F5102 Adjustment insomnia: Secondary | ICD-10-CM | POA: Diagnosis not present

## 2024-09-18 DIAGNOSIS — F411 Generalized anxiety disorder: Secondary | ICD-10-CM | POA: Diagnosis not present

## 2024-09-19 ENCOUNTER — Telehealth: Payer: Self-pay | Admitting: Family Medicine

## 2024-09-19 LAB — CULTURE, BLOOD (ROUTINE X 2)
Culture: NO GROWTH
Culture: NO GROWTH
Special Requests: ADEQUATE
Special Requests: ADEQUATE

## 2024-09-19 NOTE — Telephone Encounter (Signed)
 [facility] faxed Surgical Clearance, to be filled out by provider. Patient requested to send it back via Fax within ASAP. Document is located in providers tray at front office.Please advise at [phone]. Pt is scheduled for his 6 month f/u in January, will he need sooner OV for this? Please advise.

## 2024-09-20 DIAGNOSIS — D519 Vitamin B12 deficiency anemia, unspecified: Secondary | ICD-10-CM | POA: Diagnosis not present

## 2024-09-20 DIAGNOSIS — I10 Essential (primary) hypertension: Secondary | ICD-10-CM | POA: Diagnosis not present

## 2024-09-20 DIAGNOSIS — I4891 Unspecified atrial fibrillation: Secondary | ICD-10-CM | POA: Diagnosis not present

## 2024-09-20 DIAGNOSIS — I2510 Atherosclerotic heart disease of native coronary artery without angina pectoris: Secondary | ICD-10-CM | POA: Diagnosis not present

## 2024-09-20 DIAGNOSIS — K219 Gastro-esophageal reflux disease without esophagitis: Secondary | ICD-10-CM | POA: Diagnosis not present

## 2024-09-20 DIAGNOSIS — H3530 Unspecified macular degeneration: Secondary | ICD-10-CM | POA: Diagnosis not present

## 2024-09-20 DIAGNOSIS — Z960 Presence of urogenital implants: Secondary | ICD-10-CM | POA: Diagnosis not present

## 2024-09-20 DIAGNOSIS — F0390 Unspecified dementia without behavioral disturbance: Secondary | ICD-10-CM | POA: Diagnosis not present

## 2024-09-20 NOTE — Telephone Encounter (Signed)
 Placed in Dr. Katrinka Peto for review.

## 2024-09-23 ENCOUNTER — Emergency Department (HOSPITAL_COMMUNITY)

## 2024-09-23 ENCOUNTER — Encounter (HOSPITAL_COMMUNITY): Payer: Self-pay

## 2024-09-23 ENCOUNTER — Emergency Department (HOSPITAL_COMMUNITY)
Admission: EM | Admit: 2024-09-23 | Discharge: 2024-09-23 | Disposition: A | Discharge status: Skilled Nursing Facility | Attending: Emergency Medicine | Admitting: Emergency Medicine

## 2024-09-23 DIAGNOSIS — I10 Essential (primary) hypertension: Secondary | ICD-10-CM | POA: Insufficient documentation

## 2024-09-23 DIAGNOSIS — R072 Precordial pain: Secondary | ICD-10-CM | POA: Diagnosis not present

## 2024-09-23 DIAGNOSIS — I4891 Unspecified atrial fibrillation: Secondary | ICD-10-CM | POA: Insufficient documentation

## 2024-09-23 DIAGNOSIS — N3000 Acute cystitis without hematuria: Secondary | ICD-10-CM | POA: Diagnosis not present

## 2024-09-23 DIAGNOSIS — R410 Disorientation, unspecified: Secondary | ICD-10-CM | POA: Diagnosis not present

## 2024-09-23 DIAGNOSIS — Z79899 Other long term (current) drug therapy: Secondary | ICD-10-CM | POA: Insufficient documentation

## 2024-09-23 DIAGNOSIS — Z743 Need for continuous supervision: Secondary | ICD-10-CM | POA: Diagnosis not present

## 2024-09-23 DIAGNOSIS — J90 Pleural effusion, not elsewhere classified: Secondary | ICD-10-CM | POA: Diagnosis not present

## 2024-09-23 DIAGNOSIS — I491 Atrial premature depolarization: Secondary | ICD-10-CM | POA: Diagnosis not present

## 2024-09-23 DIAGNOSIS — Z85820 Personal history of malignant melanoma of skin: Secondary | ICD-10-CM | POA: Insufficient documentation

## 2024-09-23 DIAGNOSIS — Z7901 Long term (current) use of anticoagulants: Secondary | ICD-10-CM | POA: Insufficient documentation

## 2024-09-23 DIAGNOSIS — Z96611 Presence of right artificial shoulder joint: Secondary | ICD-10-CM | POA: Diagnosis not present

## 2024-09-23 DIAGNOSIS — I2510 Atherosclerotic heart disease of native coronary artery without angina pectoris: Secondary | ICD-10-CM | POA: Diagnosis not present

## 2024-09-23 DIAGNOSIS — N200 Calculus of kidney: Secondary | ICD-10-CM | POA: Diagnosis not present

## 2024-09-23 DIAGNOSIS — K8689 Other specified diseases of pancreas: Secondary | ICD-10-CM | POA: Diagnosis not present

## 2024-09-23 DIAGNOSIS — K5730 Diverticulosis of large intestine without perforation or abscess without bleeding: Secondary | ICD-10-CM | POA: Diagnosis not present

## 2024-09-23 DIAGNOSIS — R918 Other nonspecific abnormal finding of lung field: Secondary | ICD-10-CM | POA: Diagnosis not present

## 2024-09-23 DIAGNOSIS — R079 Chest pain, unspecified: Secondary | ICD-10-CM | POA: Diagnosis not present

## 2024-09-23 DIAGNOSIS — R0989 Other specified symptoms and signs involving the circulatory and respiratory systems: Secondary | ICD-10-CM | POA: Diagnosis not present

## 2024-09-23 DIAGNOSIS — R1013 Epigastric pain: Secondary | ICD-10-CM | POA: Diagnosis present

## 2024-09-23 LAB — URINALYSIS, ROUTINE W REFLEX MICROSCOPIC
Bilirubin Urine: NEGATIVE
Glucose, UA: NEGATIVE mg/dL
Ketones, ur: 20 mg/dL — AB
Nitrite: POSITIVE — AB
Protein, ur: 100 mg/dL — AB
RBC / HPF: >50 RBC/hpf (ref 0–5)
Specific Gravity, Urine: 1.021 (ref 1.005–1.030)
WBC, UA: >50 WBC/hpf (ref 0–5)
pH: 5 (ref 5.0–8.0)

## 2024-09-23 LAB — CBC WITH DIFFERENTIAL/PLATELET
Abs Immature Granulocytes: 0.01 K/uL (ref 0.00–0.07)
Basophils Absolute: 0 K/uL (ref 0.0–0.1)
Basophils Relative: 0 %
Eosinophils Absolute: 0.1 K/uL (ref 0.0–0.5)
Eosinophils Relative: 1 %
HCT: 44.6 % (ref 39.0–52.0)
Hemoglobin: 14.9 g/dL (ref 13.0–17.0)
Immature Granulocytes: 0 %
Lymphocytes Relative: 11 %
Lymphs Abs: 0.8 K/uL (ref 0.7–4.0)
MCH: 31 pg (ref 26.0–34.0)
MCHC: 33.4 g/dL (ref 30.0–36.0)
MCV: 92.9 fL (ref 80.0–100.0)
Monocytes Absolute: 0.7 K/uL (ref 0.1–1.0)
Monocytes Relative: 9 %
Neutro Abs: 5.9 K/uL (ref 1.7–7.7)
Neutrophils Relative %: 79 %
Platelets: 148 K/uL — ABNORMAL LOW (ref 150–400)
RBC: 4.8 MIL/uL (ref 4.22–5.81)
RDW: 12.6 % (ref 11.5–15.5)
WBC: 7.5 K/uL (ref 4.0–10.5)
nRBC: 0 % (ref 0.0–0.2)

## 2024-09-23 LAB — TROPONIN I (HIGH SENSITIVITY)
Troponin I (High Sensitivity): 11 ng/L (ref <18)
Troponin I (High Sensitivity): 11 ng/L (ref <18)

## 2024-09-23 LAB — COMPREHENSIVE METABOLIC PANEL WITH GFR
ALT: 16 U/L (ref 0–44)
AST: 21 U/L (ref 15–41)
Albumin: 3.6 g/dL (ref 3.5–5.0)
Alkaline Phosphatase: 89 U/L (ref 38–126)
Anion gap: 13 (ref 5–15)
BUN: 11 mg/dL (ref 8–23)
CO2: 23 mmol/L (ref 22–32)
Calcium: 8.9 mg/dL (ref 8.9–10.3)
Chloride: 100 mmol/L (ref 98–111)
Creatinine, Ser: 0.97 mg/dL (ref 0.61–1.24)
GFR, Estimated: >60 mL/min (ref >60)
Glucose, Bld: 108 mg/dL — ABNORMAL HIGH (ref 70–99)
Potassium: 3.5 mmol/L (ref 3.5–5.1)
Sodium: 136 mmol/L (ref 135–145)
Total Bilirubin: 2 mg/dL — ABNORMAL HIGH (ref 0.0–1.2)
Total Protein: 6.7 g/dL (ref 6.5–8.1)

## 2024-09-23 MED ORDER — CEPHALEXIN 500 MG PO CAPS: 500.0000 mg | ORAL_CAPSULE | Freq: Four times a day (QID) | ORAL | 0 refills | Status: DC

## 2024-09-23 MED ORDER — CEFTRIAXONE SODIUM 1 G IJ SOLR
1.0000 g | Freq: Once | INTRAMUSCULAR | Status: AC
  Administered 2024-09-23: 1 g via INTRAMUSCULAR
  Filled 2024-09-23: qty 10

## 2024-09-23 MED ORDER — STERILE WATER FOR INJECTION IJ SOLN
INTRAMUSCULAR | Status: AC
  Administered 2024-09-23: 2.1 mL
  Filled 2024-09-23: qty 10

## 2024-09-23 NOTE — ED Provider Notes (Signed)
 [place] EMERGENCY DEPARTMENT AT [facility] Provider Note   CSN: [id number] Arrival date & time: 09/23/24  0321     Patient presents with: Chest Pain   Joseph Simpson is a 89 y.o. male.   The history is provided by the patient.  Chest Pain Pain location:  Substernal area and epigastric Pain quality: dull   Pain radiates to:  Does not radiate Pain severity:  Moderate Onset quality:  Gradual Duration:  3 days Timing:  Constant Progression:  Unchanged Chronicity:  Recurrent Context: at rest   Relieved by:  Nothing Worsened by:  Nothing Ineffective treatments:  None tried Associated symptoms: no fever, no headache, no heartburn, no lower extremity edema, no nausea, no near-syncope, no orthopnea, no palpitations, no shortness of breath and no vomiting   Patient with GERD and kidney stones presents with epigastric and low dull CP x 3 days constantly. No associated symptom.     Past Medical History:  Diagnosis Date   Aortic atherosclerosis    Arthritis    Atrial fibrillation (HCC)    B12 deficiency    Bladder calculi    CAD (coronary artery disease)    cardiologist--- dr jordan;   abnormal nuclear stress test 11/ 2013;  s/p cardiac cath 10-21-2012 minimal nonobstructiive CAD w/ normal LVF   Diverticulosis    Dyslipidemia    Esophagitis    Full dentures    Gastroparesis    followed by Massie Beagle PA ( [facility] in [place]) s/p gastrectomy for ulcers in 1970s   GERD (gastroesophageal reflux disease)    Heart murmur    Hiatal hernia    History of adenomatous polyp of colon    History of kidney stones    History of melanoma 2008   s/p left forearn wide local excision 06/ 2008   History of syncope 2010   cardiologist --- dr jordan;  (11-25-2021 per pt no syncope or near syncope since)   Hypertension    Macular degeneration of both eyes    Nocturia associated with benign prostatic hyperplasia    OSA (obstructive sleep apnea)    no cpap    Polyneuropathy    PVC's (premature ventricular contractions) 2010   06/ 2010 per event monitor SR w/ occasional PVCs and 4 beat SVT (in epic)   Wears glasses      Prior to Admission medications   Medication Sig Start Date End Date Taking? Authorizing Provider  apixaban  (ELIQUIS ) 5 MG TABS tablet Take 1 tablet (5 mg total) by mouth 2 (two) times daily. 09/24/23   Katrinka Garnette KIDD, MD  atorvastatin  (LIPITOR) 20 MG tablet TAKE 1 TABLET BY MOUTH 3 TIMES A WEEK Patient taking differently: Take 20 mg by mouth every Monday, Wednesday, and Friday. 01/20/24   Katrinka Garnette KIDD, MD  cephALEXin  (KEFLEX ) 500 MG capsule Take 1 capsule (500 mg total) by mouth 4 (four) times daily. 09/23/24  Yes Makalya Nave, MD  escitalopram  (LEXAPRO ) 10 MG tablet TAKE 1 TABLET BY MOUTH EVERY DAY 03/07/24   Katrinka Garnette KIDD, MD  LORazepam  (ATIVAN ) 1 MG tablet TAKE 1 TAB BY MOUTH 2 TIMES DAILY AS NEEDED FOR ANXIETY*NEEDS TO BE SUPERVISED 8-10HRS AFTER USE-FALL RISK Patient taking differently: Take 1 mg by mouth See admin instructions. Take 1 tablet (1 mg) by mouth in the morning if needed for anxiety & take 1 tablet (1 mg) by mouth scheduled at bedtime. 09/04/24   Katrinka Garnette KIDD, MD  losartan  (COZAAR ) 25 MG tablet TAKE  0.5-1 TAB DAILY. IF BLOOD PRESSURE OVER 145 WHEN YOU CHECK- OK TO TAKE OTHER HALF TABLET ONCE PER DAY (SO TOTAL OF 25MG  PER DAY) Patient taking differently: Take 12.5 mg by mouth in the morning. 12/07/23   Katrinka Garnette KIDD, MD  melatonin 5 MG TABS Take 5 mg by mouth at bedtime.    [provider]  metoprolol  tartrate (LOPRESSOR ) 25 MG tablet TAKE 1/2 TABLET TWICE A DAY BY MOUTH 09/24/23   Katrinka Garnette KIDD, MD  pantoprazole  (PROTONIX ) 40 MG tablet TAKE 1 TABLET BY MOUTH TWICE A DAY 08/03/23   Katrinka Garnette KIDD, MD    Allergies: Tape, Aspirin , Percocet [oxycodone -acetaminophen ], and Vitamins [apatate]    Review of Systems  Constitutional:  Negative for fever.  Respiratory:  Negative for shortness  of breath.   Cardiovascular:  Positive for chest pain. Negative for palpitations, orthopnea and near-syncope.  Gastrointestinal:  Negative for heartburn, nausea and vomiting.  Neurological:  Negative for headaches.  All other systems reviewed and are negative.   Updated Vital Signs BP (!) 156/79   Pulse 80   Temp 98.2 F (36.8 C) (Oral)   Resp 18   SpO2 98%   Physical Exam Vitals and nursing note reviewed.  Constitutional:      General: He is not in acute distress.    Appearance: He is well-developed. He is not diaphoretic.  HENT:     Head: Normocephalic and atraumatic.     Nose: Nose normal.  Eyes:     Conjunctiva/sclera: Conjunctivae normal.     Pupils: Pupils are equal, round, and reactive to light.  Cardiovascular:     Rate and Rhythm: Normal rate and regular rhythm.     Pulses: Normal pulses.     Heart sounds: Normal heart sounds.  Pulmonary:     Effort: Pulmonary effort is normal.     Breath sounds: Normal breath sounds. No wheezing or rales.  Abdominal:     General: Bowel sounds are normal.     Palpations: Abdomen is soft.     Tenderness: There is no abdominal tenderness. There is no guarding or rebound.  Musculoskeletal:        General: Normal range of motion.     Cervical back: Normal range of motion and neck supple.  Skin:    General: Skin is warm and dry.     Capillary Refill: Capillary refill takes less than 2 seconds.  Neurological:     General: No focal deficit present.     Mental Status: He is alert and oriented to person, place, and time.     (all labs ordered are listed, but only abnormal results are displayed) Results for orders placed or performed during the hospital encounter of 09/23/24  CBC with Differential   Collection Time: 09/23/24  3:28 AM  Result Value Ref Range   WBC 7.5 4.0 - 10.5 K/uL   RBC 4.80 4.22 - 5.81 MIL/uL   Hemoglobin 14.9 13.0 - 17.0 g/dL   HCT 55.3 60.9 - 47.9 %   MCV 92.9 80.0 - 100.0 fL   MCH 31.0 26.0 - 34.0 pg    MCHC 33.4 30.0 - 36.0 g/dL   RDW 87.3 88.4 - 84.4 %   Platelets 148 (L) 150 - 400 K/uL   nRBC 0.0 0.0 - 0.2 %   Neutrophils Relative % 79 %   Neutro Abs 5.9 1.7 - 7.7 K/uL   Lymphocytes Relative 11 %   Lymphs Abs 0.8 0.7 - 4.0 K/uL  Monocytes Relative 9 %   Monocytes Absolute 0.7 0.1 - 1.0 K/uL   Eosinophils Relative 1 %   Eosinophils Absolute 0.1 0.0 - 0.5 K/uL   Basophils Relative 0 %   Basophils Absolute 0.0 0.0 - 0.1 K/uL   Immature Granulocytes 0 %   Abs Immature Granulocytes 0.01 0.00 - 0.07 K/uL  Comprehensive metabolic panel   Collection Time: 09/23/24  3:28 AM  Result Value Ref Range   Sodium 136 135 - 145 mmol/L   Potassium 3.5 3.5 - 5.1 mmol/L   Chloride 100 98 - 111 mmol/L   CO2 23 22 - 32 mmol/L   Glucose, Bld 108 (H) 70 - 99 mg/dL   BUN 11 8 - 23 mg/dL   Creatinine, Ser 9.02 0.61 - 1.24 mg/dL   Calcium  8.9 8.9 - 10.3 mg/dL   Total Protein 6.7 6.5 - 8.1 g/dL   Albumin  3.6 3.5 - 5.0 g/dL   AST 21 15 - 41 U/L   ALT 16 0 - 44 U/L   Alkaline Phosphatase 89 38 - 126 U/L   Total Bilirubin 2.0 (H) 0.0 - 1.2 mg/dL   GFR, Estimated >39 >39 mL/min   Anion gap 13 5 - 15  Troponin I (High Sensitivity)   Collection Time: 09/23/24  3:28 AM  Result Value Ref Range   Troponin I (High Sensitivity) 11 <18 ng/L  Urinalysis, Routine w reflex microscopic -Urine, Catheterized; Indwelling urinary catheter   Collection Time: 09/23/24  5:38 AM  Result Value Ref Range   Color, Urine AMBER (A) YELLOW   APPearance CLOUDY (A) CLEAR   Specific Gravity, Urine 1.021 1.005 - 1.030   pH 5.0 5.0 - 8.0   Glucose, UA NEGATIVE NEGATIVE mg/dL   Hgb urine dipstick LARGE (A) NEGATIVE   Bilirubin Urine NEGATIVE NEGATIVE   Ketones, ur 20 (A) NEGATIVE mg/dL   Protein, ur 899 (A) NEGATIVE mg/dL   Nitrite POSITIVE (A) NEGATIVE   Leukocytes,Ua LARGE (A) NEGATIVE   RBC / HPF >50 0 - 5 RBC/hpf   WBC, UA >50 0 - 5 WBC/hpf   Bacteria, UA MANY (A) NONE SEEN   Squamous Epithelial / HPF 0-5 0 - 5  /HPF   WBC Clumps PRESENT   Troponin I (High Sensitivity)   Collection Time: 09/23/24  5:38 AM  Result Value Ref Range   Troponin I (High Sensitivity) 11 <18 ng/L   CT Renal Stone Study Result Date: 09/23/2024 EXAM: CT UROGRAM 09/23/2024 05:02:42 AM TECHNIQUE: CT of the abdomen and pelvis was performed without the administration of intravenous contrast as per CT urogram protocol. Multiplanar reformatted images as well as MIP urogram images are provided for review. Automated exposure control, iterative reconstruction, and/or weight based adjustment of the mA/kV was utilized to reduce the radiation dose to as low as reasonably achievable. COMPARISON: Portable chest x ray 09/23/2024 reported separately. CTA chest abdomen and pelvis 09/14/2024. CLINICAL HISTORY: 89 year old male with chest, abdominal, and flank pain. Stone suspected. FINDINGS: LOWER CHEST: Right pleural effusion has regressed with only trace residual. No pericardial effusion. Chronic postoperative changes to the esophageal hiatus, gastroesophageal junction appear stable. Bilateral pulmonary interstitial opacities at the lung bases have regressed but not fully resolved. There is some residual septal thickening and otherwise mild dependent atelectasis. LIVER: The liver is unremarkable. GALLBLADDER AND BILE DUCTS: Gallbladder remains in place. No biliary ductal dilatation. SPLEEN: No acute abnormality. PANCREAS: Pancreatic atrophy. ADRENAL GLANDS: No acute abnormality. KIDNEYS, URETERS AND BLADDER: Bilateral nephrolithiasis, including developing staghorn type  calculus on the right which is 10 mm. No hydronephrosis or hydroureter. Ureteral catheter in place. Ongoing severe urinary bladder wall thickening, irregularity, inflammatory stranding. Bulky and irregular bladder wall thickening calcifications are stable, without evidence of associated ureteral obstruction. No perinephric or periureteral stranding. GI AND BOWEL: Stomach demonstrates no acute  abnormality. Extensive surgical clips in and around the stomach, including the gastrohepatic ligament, appears stable. This appears in part related to a chronic distal gastrectomy and gastrojejunostomy. Decompressed residual duodenum and proximal jejunum. Large bowel redundancy. Extensive sigmoid colon diverticulosis with no active inflammation. Moderate descending colon diverticulosis. Mild to moderate transverse colon and hepatic flexure diverticulosis but no active inflammation. No dilated bowel loops. Diminutive or absent appendix is not identified. PERITONEUM AND RETROPERITONEUM: No ascites. No free air. No pelvis free fluid. VASCULATURE: Aorta is normal in caliber. Calcified aortoiliac atherosclerosis. Vascular patency not evaluated in the absence of intravenous contrast. LYMPH NODES: No lymphadenopathy. REPRODUCTIVE ORGANS: No acute abnormality. BONES AND SOFT TISSUES: Osteopenia. Stable visible spine including of transitional anatomy. L3 mild inferior endplate compression and or Schmorl node. Left hip arthroplasty with streak artifact. Stable chronic postoperative changes to the ventral abdominal wall. No acute osseous abnormality. No focal soft tissue abnormality. IMPRESSION: 1. Severe urinary bladder wall thickening and irregularity with surrounding inflammatory stranding, stable from recent but compatible with a moderate acute cystitis or UTI. Foley in place. 2. Underlying bulky bladder calculi. And nephrolithiasis developing right staghorn-type renal calculus (10 mm), but no acute obstructive uropathy. 3. No other acute or inflammatory process identified in the abdomen and pelvis. 4. Regressed lung base pulmonary edema and/or inflammation. And regressed right pleural effusion since 09/14/24. Electronically signed by: Helayne Hurst MD 09/23/2024 06:12 AM EST RP Workstation: [id number]   DG Chest Portable 1 View Result Date: 09/23/2024 EXAM: 1 VIEW(S) XRAY OF THE CHEST 09/23/2024 04:37:02 AM COMPARISON:  09/14/2024 CLINICAL HISTORY: 89 year old male with chest pain. FINDINGS: LUNGS AND PLEURA: Low lung volumes. Interstitial opacities. Bibasilar patchy opacities. Trace bilateral pleural effusions. No pulmonary edema. No pneumothorax. HEART AND MEDIASTINUM: Similar enlarged cardiomediastinal silhouette. BONES AND SOFT TISSUES: Surgical clips over right upper quadrant. Partially imaged right total shoulder arthroplasty. IMPRESSION: 1. Continued Low lung volumes with interstitial opacities, bibasilar patchy opacities, and small pleural effusion(s). No significant change since 09/14/2024. Electronically signed by: Helayne Hurst MD 09/23/2024 05:55 AM EST RP Workstation: [id number]   CT Angio Chest/Abd/Pel for Dissection W and/or Wo Contrast Result Date: 09/14/2024 CLINICAL DATA:  Acute aortic syndrome suspected. Chest pain and upper abdominal pain. EXAM: CT ANGIOGRAPHY CHEST, ABDOMEN AND PELVIS TECHNIQUE: Non-contrast CT of the chest was initially obtained. Multidetector CT imaging through the chest, abdomen and pelvis was performed using the standard protocol during bolus administration of intravenous contrast. Multiplanar reconstructed images and MIPs were obtained and reviewed to evaluate the vascular anatomy. RADIATION DOSE REDUCTION: This exam was performed according to the departmental dose-optimization program which includes automated exposure control, adjustment of the mA and/or kV according to patient size and/or use of iterative reconstruction technique. CONTRAST:  OMNIPAQUE  IOHEXOL  350 MG/ML SOLN COMPARISON:  CT abdomen/pelvis 06/01/2024 and 08/21/2024, CT chest 06/04/2015 FINDINGS: CTA CHEST FINDINGS Cardiovascular: Mild cardiomegaly. Calcification over the mitral valve annulus minimal calcified plaque over the left anterior descending and lateral circumflex coronary arteries. Thoracic aorta measures 3.7 cm in AP diameter. No evidence of aneurysm or dissection. Bovine morphology of the aortic arch.  Minimal calcified plaque over the descending thoracic aorta. Pulmonary arterial system is well opacified without  evidence of emboli. Mediastinum/Nodes: No significant mediastinal or hilar adenopathy. There are several calcified mediastinal and hilar lymph nodes. Remaining mediastinal structures are unremarkable. Lungs/Pleura: Lungs are adequately inflated demonstrate a small right pleural effusion. Mild bilateral dependent atelectatic changes present. No focal lobar consolidation. Airways are unremarkable. Musculoskeletal: No focal abnormality. Review of the MIP images confirms the above findings. CTA ABDOMEN AND PELVIS FINDINGS VASCULAR Aorta: Abdominal aorta is normal caliber without evidence of aneurysm or dissection. There is mild calcified plaque throughout the abdominal aorta. Celiac: Patent without evidence of aneurysm, dissection, vasculitis or significant stenosis. SMA: Patent without evidence of aneurysm, dissection, vasculitis or significant stenosis. Renals: Both renal arteries are patent without evidence of aneurysm, dissection, vasculitis, fibromuscular dysplasia or significant stenosis. IMA: Patent without evidence of aneurysm, dissection, vasculitis or significant stenosis. Inflow: Patent without evidence of aneurysm, dissection, vasculitis or significant stenosis. Veins: No obvious venous abnormality within the limitations of this arterial phase study. Review of the MIP images confirms the above findings. NON-VASCULAR Hepatobiliary: Liver, gallbladder and biliary tree are normal. Pancreas: Normal. Spleen: Normal. Adrenals/Urinary Tract: Adrenal glands are normal. Kidneys are normal in size. Evidence of known bilateral nephrolithiasis with largest stone over the lower pole right kidney measuring 9 mm. No evidence of hydronephrosis. Ureters are unremarkable. Multiple bladder stones unchanged. Foley catheter is present within a decompressed bladder. Stomach/Bowel: Multiple surgical clips over the  gastroesophageal junction. Surgical suture line over the stomach. Gastrojejunostomy appears patent. Remaining small bowel is unremarkable. Moderate diverticulosis throughout the colon without active inflammation. Prior appendectomy. Lymphatic: No adenopathy. Reproductive: Prostate is unremarkable. Other: No significant free fluid or focal inflammatory change. No free peritoneal air. Musculoskeletal: Left total hip arthroplasty intact. No acute fracture. Review of the MIP images confirms the above findings. IMPRESSION: 1. No evidence of thoracic or abdominal aortic aneurysm or dissection. 2. Small right pleural effusion with mild bilateral dependent atelectatic changes. 3. Mild cardiomegaly. Atherosclerotic coronary artery disease. 4. Evidence of known bilateral nephrolithiasis and bladder stones. No evidence of hydronephrosis. 5. Colonic diverticulosis without active inflammation. 6. Thoracoabdominal aortic atherosclerosis. Aortic Atherosclerosis (ICD10-I70.0). Electronically Signed   By: Toribio Agreste M.D.   On: 09/14/2024 11:14   DG Chest Port 1 View Result Date: 09/14/2024 EXAM: 2 VIEW(S) XRAY OF THE CHEST 09/14/2024 09:23:00 AM COMPARISON: AP/PA and lateral radiographs of the chest dated 05/25/2023. CLINICAL HISTORY: Chest pain FINDINGS: LUNGS AND PLEURA: Trace bilateral pleural effusions. Diffuse interstitial prominence. Bibasilar platelike opacities. No focal pulmonary opacity. No pulmonary edema. No pneumothorax. HEART AND MEDIASTINUM: Cardiomegaly. Atherosclerotic calcifications. Surgical clips over GE junction. BONES AND SOFT TISSUES: Status post right shoulder arthroplasty. No acute osseous abnormality. IMPRESSION: 1. Cardiomegaly and atherosclerotic calcifications. 2. Trace bilateral pleural effusions, diffuse interstitial prominence, and bibasilar platelike opacities. Electronically signed by: Evalene Coho MD 09/14/2024 09:35 AM EDT RP Workstation: [id number]    EKG: EKG  Interpretation Date/Time:  Saturday September 23 2024 03:27:44 EST Ventricular Rate:  63 PR Interval:  237 QRS Duration:  105 QT Interval:  299 QTC Calculation: 306 R Axis:   64  Text Interpretation: Sinus rhythm Prolonged PR interval Probable LVH with secondary repol abnrm Confirmed by Manar Smalling ([id number]) on 09/23/2024 4:48:47 AM  Radiology: CT Renal Stone Study Result Date: 09/23/2024 EXAM: CT UROGRAM 09/23/2024 05:02:42 AM TECHNIQUE: CT of the abdomen and pelvis was performed without the administration of intravenous contrast as per CT urogram protocol. Multiplanar reformatted images as well as MIP urogram images are provided for review. Automated exposure control, iterative reconstruction, and/or  weight based adjustment of the mA/kV was utilized to reduce the radiation dose to as low as reasonably achievable. COMPARISON: Portable chest x ray 09/23/2024 reported separately. CTA chest abdomen and pelvis 09/14/2024. CLINICAL HISTORY: 89 year old male with chest, abdominal, and flank pain. Stone suspected. FINDINGS: LOWER CHEST: Right pleural effusion has regressed with only trace residual. No pericardial effusion. Chronic postoperative changes to the esophageal hiatus, gastroesophageal junction appear stable. Bilateral pulmonary interstitial opacities at the lung bases have regressed but not fully resolved. There is some residual septal thickening and otherwise mild dependent atelectasis. LIVER: The liver is unremarkable. GALLBLADDER AND BILE DUCTS: Gallbladder remains in place. No biliary ductal dilatation. SPLEEN: No acute abnormality. PANCREAS: Pancreatic atrophy. ADRENAL GLANDS: No acute abnormality. KIDNEYS, URETERS AND BLADDER: Bilateral nephrolithiasis, including developing staghorn type calculus on the right which is 10 mm. No hydronephrosis or hydroureter. Ureteral catheter in place. Ongoing severe urinary bladder wall thickening, irregularity, inflammatory stranding. Bulky and irregular  bladder wall thickening calcifications are stable, without evidence of associated ureteral obstruction. No perinephric or periureteral stranding. GI AND BOWEL: Stomach demonstrates no acute abnormality. Extensive surgical clips in and around the stomach, including the gastrohepatic ligament, appears stable. This appears in part related to a chronic distal gastrectomy and gastrojejunostomy. Decompressed residual duodenum and proximal jejunum. Large bowel redundancy. Extensive sigmoid colon diverticulosis with no active inflammation. Moderate descending colon diverticulosis. Mild to moderate transverse colon and hepatic flexure diverticulosis but no active inflammation. No dilated bowel loops. Diminutive or absent appendix is not identified. PERITONEUM AND RETROPERITONEUM: No ascites. No free air. No pelvis free fluid. VASCULATURE: Aorta is normal in caliber. Calcified aortoiliac atherosclerosis. Vascular patency not evaluated in the absence of intravenous contrast. LYMPH NODES: No lymphadenopathy. REPRODUCTIVE ORGANS: No acute abnormality. BONES AND SOFT TISSUES: Osteopenia. Stable visible spine including of transitional anatomy. L3 mild inferior endplate compression and or Schmorl node. Left hip arthroplasty with streak artifact. Stable chronic postoperative changes to the ventral abdominal wall. No acute osseous abnormality. No focal soft tissue abnormality. IMPRESSION: 1. Severe urinary bladder wall thickening and irregularity with surrounding inflammatory stranding, stable from recent but compatible with a moderate acute cystitis or UTI. Foley in place. 2. Underlying bulky bladder calculi. And nephrolithiasis developing right staghorn-type renal calculus (10 mm), but no acute obstructive uropathy. 3. No other acute or inflammatory process identified in the abdomen and pelvis. 4. Regressed lung base pulmonary edema and/or inflammation. And regressed right pleural effusion since 09/14/24. Electronically signed by:  Helayne Hurst MD 09/23/2024 06:12 AM EST RP Workstation: [id number]   DG Chest Portable 1 View Result Date: 09/23/2024 EXAM: 1 VIEW(S) XRAY OF THE CHEST 09/23/2024 04:37:02 AM COMPARISON: 09/14/2024 CLINICAL HISTORY: 89 year old male with chest pain. FINDINGS: LUNGS AND PLEURA: Low lung volumes. Interstitial opacities. Bibasilar patchy opacities. Trace bilateral pleural effusions. No pulmonary edema. No pneumothorax. HEART AND MEDIASTINUM: Similar enlarged cardiomediastinal silhouette. BONES AND SOFT TISSUES: Surgical clips over right upper quadrant. Partially imaged right total shoulder arthroplasty. IMPRESSION: 1. Continued Low lung volumes with interstitial opacities, bibasilar patchy opacities, and small pleural effusion(s). No significant change since 09/14/2024. Electronically signed by: Helayne Hurst MD 09/23/2024 05:55 AM EST RP Workstation: [id number]     Procedures   Medications Ordered in the ED  cefTRIAXone  (ROCEPHIN ) injection 1 g (has no administration in time range)  Medical Decision Making Patient with low chest epigastric pain   Amount and/or Complexity of Data Reviewed Independent Historian: EMS    Details: See above  External Data Reviewed: notes.    Details: Previous notes reviewed  Labs: ordered.    Details: 2 negative troponins 11, 11. Urine is consistent with UTI.  Normal sodium 136, normal potassium 3.5, normal creatinine 0.97.  normal white count 7.5, normal hemoglobin 14.9, platelets slight low 148  Radiology: ordered and independent interpretation performed.    Details: No stones by me  ECG/medicine tests: ordered and independent interpretation performed. Decision-making details documented in ED Course.  Risk Prescription drug management. Risk Details: Patient not septic.  Ruled out for MI and pNA.  Will start oral antibiotics for urine.  Stable for discharge.      Final diagnoses:  Acute cystitis without hematuria   Precordial pain   . No signs of systemic illness or infection. The patient is nontoxic-appearing on exam and vital signs are within normal limits.  I have reviewed the triage vital signs and the nursing notes. Pertinent labs & imaging results that were available during my care of the patient were reviewed by me and considered in my medical decision making (see chart for details). After history, exam, and medical workup I feel the patient has been appropriately medically screened and is safe for discharge home. Pertinent diagnoses were discussed with the patient. Patient was given return precautions.      ED Discharge Orders          Ordered    cephALEXin  (KEFLEX ) 500 MG capsule  4 times daily        09/23/24 0636               Lakina Mcintire, MD 09/23/24 [id number]

## 2024-09-23 NOTE — ED Notes (Signed)
 Patient transported to CT

## 2024-09-23 NOTE — ED Notes (Signed)
 Son ab questions answered at nursing station.

## 2024-09-23 NOTE — ED Notes (Addendum)
 Attempted to call 2x to [facility] the listed facility on his paper work [phone]), On 3rd attempt phone answered by med tech, phone number [phone] given by braxton for RN Ashely. Ashely called and report given.

## 2024-09-23 NOTE — ED Triage Notes (Signed)
 Pt comes from [facility] via [facility] for CP that started 3 days ago, intermittent, central

## 2024-09-23 NOTE — ED Notes (Signed)
 Pt leaving in care of ptar, no questions or concerns at this time.

## 2024-09-25 NOTE — Progress Notes (Signed)
 Patient was seen in [facility] ED on 09-23-24 and had blood work at that time. He lives in an assisted living facility so we did a phone call PST interview with the patient's Son Nachman Sundt [phone].   The patient was identified using 2 approved identifiers. All issues noted in this document were discussed and addressed. A copy of the instructions were faxed to [facility] and I spoke to [facility] to verify DOS information / Medications.     The patient was instructed to call our Admitting Office [phone] or [phone]) to complete their Pre-surgical Interview.    COVID Vaccine received:  []  No [x]  Yes Date of any COVID positive Test in last 90 days:  None  PCP - Wadie Lukes, MD)  now sees MD at [facility]  Cardiologist - Peter Jordan, MD   Chest x-ray - 09-23-2024  1v EKG -  09-23-2024   Stress Test -  ECHO - 01-05-2022   Cardiac Cath - 10-2012    CT Coronary Calcium  score:   Bowel Prep - [x]  No  []   Yes ______  Pacemaker / ICD device [x]  No []  Yes   Spinal Cord Stimulator:[x]  No []  Yes       History of Sleep Apnea? []  No [x]  Yes   CPAP used?- [x]  No []  Yes    Patient has: []  NO Hx DM   [x]  Pre-DM   []  DM1  []   DM2 Does the patient monitor blood sugar?   []  N/A   [x]  No []  Yes  Last A1c was:  6.4 on 05-09-24      Blood Thinner / Instructions:  ELIQUIS   Hold 72 hours    Stopped at ED last dose 09-24-24 per son Aspirin  Instructions:  none  Comments: Lives at [facility] on [address], [place]  [phone]  Activity level: Able to walk up 2 flights of stairs without becoming significantly short of breath or having chest pain?  [x]  No   []    Yes  Patient can perform ADLs without assistance. [x]  No   []   Yes  Anesthesia review: CAD, A.fib, HTN, GERD, OSA- No CPAP, Pre-DM, Dementia, HOH- HAs, macular Degeneration.    Patient denies any S&S of respiratory illness or Covid - no shortness of breath, fever, cough or chest pain  at PAT appointment.  Patient verbalized understanding and agreement to the Pre-Surgical Instructions that were given to them at this PAT appointment. Patient was also educated of the need to review these PAT instructions again prior to his surgery.I reviewed the appropriate phone numbers to call if they have any and questions or concerns.

## 2024-09-25 NOTE — Patient Instructions (Signed)
 SURGICAL WAITING ROOM VISITATION Patients having surgery or a procedure may have no more than 2 support people in the waiting area - these visitors may rotate in the visitor waiting room.   If the patient needs to stay at the hospital during part of their recovery, the visitor guidelines for inpatient rooms apply.  PRE-OP VISITATION  Pre-op nurse will coordinate an appropriate time for 1 support person to accompany the patient in pre-op.  This support person may not rotate.  This visitor will be contacted when the time is appropriate for the visitor to come back in the pre-op area.  Please refer to the [facility] website for the visitor guidelines for Inpatients (after your surgery is over and you are in a regular room).  You are not required to quarantine at this time prior to your surgery. However, you must do this: Hand Hygiene often Do NOT share personal items Notify your provider if you are in close contact with someone who has COVID or you develop fever 100.4 or greater, new onset of sneezing, cough, sore throat, shortness of breath or body aches.  If you test positive for Covid or have been in contact with anyone that has tested positive in the last 10 days please notify you surgeon.    Your procedure is scheduled on:  THURSDAY  September 28, 2024  Report to [facility] Main Entrance: [address] entrance where the [employer] is available.   Report to admitting at:  11:30   AM  Call this number if you have any questions or problems the morning of surgery [phone]  DO NOT EAT OR DRINK ANYTHING AFTER MIDNIGHT THE NIGHT PRIOR TO YOUR SURGERY / PROCEDURE.   FOLLOW  ANY ADDITIONAL PRE OP INSTRUCTIONS YOU RECEIVED FROM YOUR SURGEON'S OFFICE!!!   Oral Hygiene is also important to reduce your risk of infection.        Remember - BRUSH YOUR TEETH THE MORNING OF SURGERY WITH YOUR REGULAR TOOTHPASTE  Do NOT smoke after Midnight the night before surgery.  ELIQUIS -  Stop  taking 72 hours before surgery.  Last dose would be taken on Sunday 09-24-24  STOP TAKING all Vitamins, Herbs and supplements 1 week before your surgery.   Take ONLY these medicines the morning of surgery with A SIP OF WATER : Pantoprazole , Metoprolol , Escitalopram  and Lorazepam  if needed.     DO NOT TAKE LOSARTAN  the morning of your surgery.   You may not have any metal on your body including  jewelry, and body piercing  Do not wear  lotions, powders, cologne, or deodorant  Men may shave face and neck.  Contacts, Hearing Aids, dentures or bridgework may not be worn into surgery. DENTURES WILL BE REMOVED PRIOR TO SURGERY PLEASE DO NOT APPLY Poly grip OR ADHESIVES!!!  Patients discharged on the day of surgery will not be allowed to drive home.  Someone NEEDS to stay with you for the first 24 hours after anesthesia.  Do not bring your home medications to the hospital. The Pharmacy will dispense medications listed on your medication list to you during your admission in the Hospital.  Special Instructions: Bring a copy of your healthcare power of attorney and living will documents the day of surgery, if you wish to have them scanned into your [facility]- EPIC  Please read over the following fact sheets you were given: IF YOU HAVE QUESTIONS ABOUT YOUR PRE-OP INSTRUCTIONS, PLEASE CALL [phone].   [facility] - Preparing for Surgery  Before surgery, you can play an important role.  Because skin is not sterile, your skin needs to be as free of germs as possible.  You can reduce the number of germs on your skin by washing with CHG (chlorahexidine gluconate) soap before surgery.  CHG is an antiseptic cleaner which kills germs and bonds with the skin to continue killing germs even after washing. Please DO NOT use if you have an allergy to CHG or antibacterial soaps.  If your skin becomes reddened/irritated stop using the CHG and inform your nurse when you arrive at Short  Stay. Do not shave (including legs and underarms) for at least 48 hours prior to the first CHG shower.  You may shave your face/neck.  Please follow these instructions carefully:  1.  Shower with CHG Soap the night before surgery ONLY (DO NOT USE THE CHG SOAP THE MORNING OF SURGERY).  2.  If you choose to wash your hair, wash your hair first as usual with your normal  shampoo.  3.  After you shampoo, rinse your hair and body thoroughly to remove the shampoo.                             4.  Use CHG as you would any other liquid soap.  You can apply chg directly to the skin and wash.  Gently with a scrungie or clean washcloth.  5.  Apply the CHG Soap to your body ONLY FROM THE NECK DOWN.   Do not use on face/ open                           Wound or open sores. Avoid contact with eyes, ears mouth and genitals (private parts).                       Wash face,  Genitals (private parts) with your normal soap.             6.  Wash thoroughly, paying special attention to the area where your  surgery  will be performed.  7.  Thoroughly rinse your body with warm water  from the neck down.  8.  DO NOT shower/wash with your normal soap after using and rinsing off the CHG Soap.                9.  Pat yourself dry with a clean towel.            10.  Wear clean pajamas.            11.  Place clean sheets on your bed the night of your first shower and do not  sleep with pets.  Day of Surgery : Do not apply any CHG, lotions/deodorants the morning of surgery.  Please wear clean clothes to the hospital/surgery center.   FAILURE TO FOLLOW THESE INSTRUCTIONS MAY RESULT IN THE CANCELLATION OF YOUR SURGERY  PATIENT SIGNATURE_________________________________  NURSE SIGNATURE__________________________________  ________________________________________________________________________

## 2024-09-26 ENCOUNTER — Encounter (HOSPITAL_COMMUNITY): Payer: Self-pay

## 2024-09-26 ENCOUNTER — Encounter (HOSPITAL_COMMUNITY)
Admission: RE | Admit: 2024-09-26 | Discharge: 2024-09-26 | Disposition: A | Discharge status: Home / Self Care | Source: Ambulatory Visit | Attending: Urology | Admitting: Urology

## 2024-09-26 HISTORY — DX: Retention of urine, unspecified: R33.9

## 2024-09-26 HISTORY — DX: Prediabetes: R73.03

## 2024-09-26 HISTORY — DX: Unspecified dementia, unspecified severity, without behavioral disturbance, psychotic disturbance, mood disturbance, and anxiety: F03.90

## 2024-09-26 HISTORY — DX: Malignant (primary) neoplasm, unspecified: C80.1

## 2024-09-26 HISTORY — DX: Depression, unspecified: F32.A

## 2024-09-26 NOTE — H&P (Incomplete)
 H&P   Chief Complaint: Bladder stone  History of Present Illness:  10 M w/ hx of Urinary retention, acontractile bladder and GH, refused cystoscopy.   08/02/23: Most recently he was addmitted to the ER for [facility] after catheter exchange. Patient is on eliquis . Will discuss symptoms of UTI and discuss possible intervention.Patient has been managed with catheter for the past 6 months per daughter who presented with patient today they have been changing the catheter every 4 weeks this has been 85 French coud it often gets clogged and causes issues. Previously the patient was prescribed acetic acid for irrigations daily they have only been doing this twice a week. Since the patient was admitted to the hospital for gross hematuria he has been having pain in the penis this is likely due to the size of catheter.Family has also been having issues with [employer]'s as well as supply of [place] syringes the acetic acid was also complicated because it takes a while to get we will plan to irrigate with saline instead. Patient has been noted to have BPH on cystoscopy several times he has been retention for 6 months we discussed possible interventions to evaluate if the diet can squeeze we discussed urodynamic study the patient's family is interested in this to see if intervention is a possibility.  Urinary retention: 12/01/23: UDS shows that patient does not have voluntary contraction to void currently managed with indwelling catheter, patient irrigating with NS to prevent catheter clogging. Catheter not clogging as much, doing well. No hospitalizatoins. No significant spasm hx 07/21/2024: Patient continues to have catheter for acontractile bladder, they are irrigating with acetic acid every other day.Would like to change catheter changes every 3 weeks as the catheter clotted off this morning. Will also increase catheter caliber to 20 French 09/11/24: catheter clotted over the weekend. 09/28/24: plan for cystolithopaxy  today   Bladder stone: 12/01/23: KUB last visit showed no bladder stone. CT shows potential bladder stones , cysto today 10/27 shows recurrent bladder stones.  GH: 12/01/23: on ELiquis , discuss cystoscopy, never a smoker not having blood in urine currently. Offered cysto patient not interested. 07/21/2024: Again discussed cystoscopy patient is interested we will set up for cystoscopy. 09/11/24: CT shows bladder stone and non obstructive renal staon consider cysto,cystolithopaxy and ESWL. Went to ER 09/09/24 for catheter not draining. still doing flushes every other day  Past Medical History:  Diagnosis Date   Aortic atherosclerosis    Arthritis    Atrial fibrillation (HCC)    B12 deficiency    Bladder calculi    CAD (coronary artery disease)    cardiologist--- dr jordan;   abnormal nuclear stress test 11/ 2013;  s/p cardiac cath 10-21-2012 minimal nonobstructiive CAD w/ normal LVF   Cancer (HCC)    skin cancer   Chronic kidney disease    Dementia (HCC)    Depression    Diverticulosis    Dyslipidemia    Esophagitis    Full dentures    Gastroparesis    followed by Massie Beagle PA ( [facility] in [place]) s/p gastrectomy for ulcers in 1970s   GERD (gastroesophageal reflux disease)    Heart murmur    Hiatal hernia    History of adenomatous polyp of colon    History of kidney stones    History of melanoma 2008   s/p left forearn wide local excision 06/ 2008   History of syncope 2010   cardiologist --- dr jordan;  (11-25-2021 per pt no syncope or near syncope  since)   Hypertension    Macular degeneration of both eyes    Nocturia associated with benign prostatic hyperplasia    OSA (obstructive sleep apnea)    no cpap   Polyneuropathy    Pre-diabetes    PVC's (premature ventricular contractions) 2010   06/ 2010 per event monitor SR w/ occasional PVCs and 4 beat SVT (in epic)   Wears glasses    Past Surgical History:  Procedure Laterality Date   APPENDECTOMY     child    BIOPSY  11/02/2022   Procedure: BIOPSY;  Surgeon: Aneita Gwendlyn DASEN, MD;  Location: [facility];  Service: Gastroenterology;;   CARDIAC CATHETERIZATION  10/2012   minimal nonobstructive CAD with normal LV function   CATARACT EXTRACTION W/ INTRAOCULAR LENS IMPLANT Bilateral    2009 approx   CYSTOSCOPY WITH LITHOLAPAXY N/A 07/13/2016   Procedure: CYSTOSCOPY WITH LITHOLAPAXY;  Surgeon: Garnette Shack, MD;  Location: [facility];  Service: Urology;  Laterality: N/A;   CYSTOSCOPY WITH LITHOLAPAXY N/A 12/01/2021   Procedure: CYSTOSCOPY WITH LITHOLAPAXY;  Surgeon: Shack Garnette, MD;  Location: [facility];  Service: Urology;  Laterality: N/A;   CYSTOSCOPY/RETROGRADE/URETEROSCOPY/STONE EXTRACTION WITH BASKET  02/24/2012   Procedure: CYSTOSCOPY/RETROGRADE/URETEROSCOPY/STONE EXTRACTION WITH BASKET;  Surgeon: Garnette Shack, MD;  Location: [facility];  Service: Urology;  Laterality: Bilateral;  1 HOUR   ESOPHAGOGASTRODUODENOSCOPY N/A 01/06/2022   Procedure: ESOPHAGOGASTRODUODENOSCOPY (EGD);  Surgeon: Burnette Fallow, MD;  Location: [facility];  Service: Gastroenterology;  Laterality: N/A;   ESOPHAGOGASTRODUODENOSCOPY (EGD) WITH PROPOFOL  N/A 11/02/2022   Procedure: ESOPHAGOGASTRODUODENOSCOPY (EGD) WITH PROPOFOL ;  Surgeon: Aneita Gwendlyn DASEN, MD;  Location: [facility];  Service: Gastroenterology;  Laterality: N/A;   EXTRACORPOREAL SHOCK WAVE LITHOTRIPSY  11/16/2011   @[facility]    GASTRECTOMY     1970s  for ulcers   HEMORRHOID SURGERY     yrs ago   HOLMIUM LASER APPLICATION N/A 12/01/2021   Procedure: HOLMIUM LASER APPLICATION;  Surgeon: Shack Garnette, MD;  Location[facility];  Service: Urology;  Laterality: N/A;   INGUINAL HERNIA REPAIR Left 07/13/2016   Procedure: REPAIR LEFT INGUINAL HERNIA;  Surgeon: Krystal Spinner, MD;  Location: [facility];  Service: General;  Laterality: Left;   INSERTION OF MESH Left 07/13/2016   Procedure: INSERTION OF MESH;   Surgeon: Krystal Spinner, MD;  Location: [facility];  Service: General;  Laterality: Left;   MELANOMA EXCISION Left 05/05/2007   @[facility]  ;   LEFT FOREARM   TONSILLECTOMY     child   TOTAL HIP ARTHROPLASTY Left 02/11/2021   Procedure: TOTAL HIP ARTHROPLASTY;  Surgeon: Josefina Chew, MD;  Location: [facility];  Service: Orthopedics;  Laterality: Left;   TOTAL SHOULDER ARTHROPLASTY Right 12/29/2017   Procedure: TOTAL SHOULDER ARTHROPLASTY;  Surgeon: Cristy Bonner DASEN, MD;  Location: [facility];  Service: Orthopedics;  Laterality: Right;    Home Medications:  No medications prior to admission.   Allergies:  Allergies  Allergen Reactions   Tape Other (See Comments)    SKIN IS FRAGILE AND WILL TEAR EASILY- PAPER TAPE ONLY   Aspirin  Other (See Comments)    UPSETS THE STOMACH   Percocet [Oxycodone -Acetaminophen ] Nausea And Vomiting   Vitamins [Apatate] Other (See Comments)    Bothers the stomach    Family History  Problem Relation Age of Onset   Heart disease Mother        CABG in her 58s   Pneumonia Father    Cancer Sister  breast   Lung cancer Brother        smoker   Colon cancer Neg Hx    Esophageal cancer Neg Hx    Liver cancer Neg Hx    Pancreatic cancer Neg Hx    Rectal cancer Neg Hx    Social History:  reports that he quit smoking about 68 years ago. His smoking use included cigarettes. He started smoking about 72 years ago. He has a 0.4 pack-year smoking history. He has never used smokeless tobacco. He reports that he does not drink alcohol  and does not use drugs.  ROS: A complete review of systems was performed.  All systems are negative except for pertinent findings as noted. ROS   Physical Exam:  Vital signs in last 24 hours: Weight:  [81.6 kg] 81.6 kg (11/11 1011) General: NAD Respiratory: normal WOB on RA Cards: RRR per monitor   Laboratory Data:  No results found for this or any previous visit (from the past 24 hours). No results found for this or any previous visit  (from the past 240 hours). Creatinine: Recent Labs    09/23/24 0328  CREATININE 0.97    Impression/Assessment:  Bladder stones: Cystoscopy today demonstrates bladder stones will plan for cystolitholapaxy we discussed respites alternatives procedure including bleeding infection damage from structures possible injury to the bladder possibly not renal stones patient would like to proceed we will get Eliquis  clearance prior urine culture ordered today.  Plan for Cystolithopaxy today.   UCX shows mixed urogenital flora.   Urinary retention: Continues to have catheter clogging likely due to stones in the bladder will remove continue daily acetic acid irrigations.  Gross hematuria: Cystoscopy today negative for any tumors review of imaging shows no upper tract or hydronephrosis.  Continue catheter changes monthly  Plan:  Proceed with cystolithopaxy today   Joseph Simpson 09/26/2024, 12:05 PM

## 2024-09-26 NOTE — Anesthesia Preprocedure Evaluation (Signed)
 Anesthesia Evaluation    Airway        Dental   Pulmonary former smoker          Cardiovascular hypertension,      Neuro/Psych    GI/Hepatic   Endo/Other    Renal/GU      Musculoskeletal   Abdominal   Peds  Hematology   Anesthesia Other Findings   Reproductive/Obstetrics                              Anesthesia Physical Anesthesia Plan  ASA:   Anesthesia Plan:    Post-op Pain Management:    Induction:   PONV Risk Score and Plan:   Airway Management Planned:   Additional Equipment:   Intra-op Plan:   Post-operative Plan:   Informed Consent:   Plan Discussed with:   Anesthesia Plan Comments: (See PAT note from 11/11)         Anesthesia Quick Evaluation

## 2024-09-26 NOTE — Progress Notes (Addendum)
 Case: [id number] Date/Time: 09/28/24 1331   Procedure: CYSTOSCOPY, WITH BLADDER CALCULUS LITHOLAPAXY   Anesthesia type: General   Diagnosis: Bladder calculus [N21.0]   Pre-op diagnosis: CALCULUS OF BLADDER   Location: [facility]OM 03 / [facility]   Surgeons: Shane Steffan BROCKS, MD       DISCUSSION: Joseph Simpson is an 89 yo male with PMH of former smoking, HTN, nonobstructive CAD (by caht), A.fib on Eliquis , OSA (no CPAP use), advanced dementia, GERD, hiatal hernia, PUD s/p gastrectomy, gastroparesis, urinary retention with chronic indwelling foley, depression.  Patient previously followed with Cardiology for hx of CAD and PVCs. He was last seen by Dr. Jordan in 2023.  He had a abnormal stress test in 2013, follow-up cardiac catheterization demonstrated nonobstructive CAD. Echo in 12/2021 was normal. Event monitor in 2023 was done showing paroxysmal Afib with burden 6%. Rate generally well controlled. He was stable at last visit. Per notes PCP has taken over cardiac care.  Last seen by PCP on 05/09/24. Pt had some mild LE edema. Pt advised to wear compression stockings. Advised continue Metoprolol  and Eliquis . BP controlled. Takes a statin for CAD. All other issues stable. Advised f/u in 6 months.  Patient seen for chest pain in the ED on 10/30. EKG was unchanged and troponins normal. CTA chest/abd/pelvis obtained which was grossly negative. Treated for UTI. Patient discharged  Seen in the ED for chest pain on 09/23/24. W/u again with EKG, troponins, CXR negative. Pain felt to be consistent with GERD. Also had complicated UTI and treated.  Discussed with Dr. Katrinka, patient's PCP via secure chat. He recommends patient sees Cardiology to ensure stability prior to surgery. Discussed with Dr. Jerrye who is in agreement. Left voicemail with [place] at [facility] urology and inbox message with Dr. Shane.  LD Eliquis  11/9  VS: Ht 6' (1.829 m)   Wt 81.6 kg   BMI 24.40 kg/m   PROVIDERS: Katrinka Garnette KIDD, MD   LABS: Labs reviewed: Acceptable for surgery. (all labs ordered are listed, but only abnormal results are displayed)  Labs Reviewed - No data to display   CT Renal 09/23/24  IMPRESSION: 1. Severe urinary bladder wall thickening and irregularity with surrounding inflammatory stranding, stable from recent but compatible with a moderate acute cystitis or UTI. Foley in place. 2. Underlying bulky bladder calculi. And nephrolithiasis developing right staghorn-type renal calculus (10 mm), but no acute obstructive uropathy. 3. No other acute or inflammatory process identified in the abdomen and pelvis. 4. Regressed lung base pulmonary edema and/or inflammation. And regressed right pleural effusion since 09/14/24.  EKG 09/23/24:  Sinus rhythm Prolonged PR interval Probable LVH with secondary repol abnrm  Echo 01/05/2022:  IMPRESSIONS    1. Left ventricular ejection fraction, by estimation, is 60 to 65%. The left ventricle has normal function. The left ventricle has no regional wall motion abnormalities. Left ventricular diastolic function could not be evaluated.  2. Right ventricular systolic function is normal. The right ventricular size is normal. There is normal pulmonary artery systolic pressure.  3. Left atrial size was mildly dilated.  4. The mitral valve is normal in structure. Mild mitral valve regurgitation. No evidence of mitral stenosis.  5. The aortic valve is normal in structure. There is mild calcification of the aortic valve. There is mild thickening of the aortic valve. Aortic valve regurgitation is not visualized. Past Medical History:  Diagnosis Date   Aortic atherosclerosis    Arthritis    Atrial fibrillation (HCC)  B12 deficiency    Bladder calculi    CAD (coronary artery disease)    cardiologist--- dr jordan;   abnormal nuclear stress test 11/ 2013;  s/p cardiac cath 10-21-2012 minimal nonobstructiive CAD w/ normal LVF   Cancer (HCC)    skin  cancer   Chronic kidney disease    Dementia (HCC)    Depression    Diverticulosis    Dyslipidemia    Esophagitis    Full dentures    Gastroparesis    followed by Massie Beagle PA ( [facility] in [place]) s/p gastrectomy for ulcers in 1970s   GERD (gastroesophageal reflux disease)    Heart murmur    Hiatal hernia    History of adenomatous polyp of colon    History of kidney stones    History of melanoma 2008   s/p left forearn wide local excision 06/ 2008   History of syncope 2010   cardiologist --- dr jordan;  (11-25-2021 per pt no syncope or near syncope since)   Hypertension    Macular degeneration of both eyes    Nocturia associated with benign prostatic hyperplasia    OSA (obstructive sleep apnea)    no cpap   Polyneuropathy    Pre-diabetes    PVC's (premature ventricular contractions) 2010   06/ 2010 per event monitor SR w/ occasional PVCs and 4 beat SVT (in epic)   Wears glasses     Past Surgical History:  Procedure Laterality Date   APPENDECTOMY     child   BIOPSY  11/02/2022   Procedure: BIOPSY;  Surgeon: Aneita Gwendlyn DASEN, MD;  [facility];  Service: Gastroenterology;;   CARDIAC CATHETERIZATION  10/2012   minimal nonobstructive CAD with normal LV function   CATARACT EXTRACTION W/ INTRAOCULAR LENS IMPLANT Bilateral    2009 approx   CYSTOSCOPY WITH LITHOLAPAXY N/A 07/13/2016   Procedure: CYSTOSCOPY WITH LITHOLAPAXY;  Surgeon: Garnette Shack, MD;  [facility];  Service: Urology;  Laterality: N/A;   CYSTOSCOPY WITH LITHOLAPAXY N/A 12/01/2021   Procedure: CYSTOSCOPY WITH LITHOLAPAXY;  Surgeon: Shack Garnette, MD;  Location: [facility];  Service: Urology;  Laterality: N/A;   CYSTOSCOPY/RETROGRADE/URETEROSCOPY/STONE EXTRACTION WITH BASKET  02/24/2012   Procedure: CYSTOSCOPY/RETROGRADE/URETEROSCOPY/STONE EXTRACTION WITH BASKET;  Surgeon: Garnette Shack, MD;  Location: [facility];  Service: Urology;  Laterality:  Bilateral;  1 HOUR   ESOPHAGOGASTRODUODENOSCOPY N/A 01/06/2022   Procedure: ESOPHAGOGASTRODUODENOSCOPY (EGD);  Surgeon: Burnette Fallow, MD;  Location: [facility];  Service: Gastroenterology;  Laterality: N/A;   ESOPHAGOGASTRODUODENOSCOPY (EGD) WITH PROPOFOL  N/A 11/02/2022   Procedure: ESOPHAGOGASTRODUODENOSCOPY (EGD) WITH PROPOFOL ;  Surgeon: Aneita Gwendlyn DASEN, MD;  [facility];  Service: Gastroenterology;  Laterality: N/A;   EXTRACORPOREAL SHOCK WAVE LITHOTRIPSY  11/16/2011   @[facility]    GASTRECTOMY     1970s  for ulcers   HEMORRHOID SURGERY     yrs ago   HOLMIUM LASER APPLICATION N/A 12/01/2021   Procedure: HOLMIUM LASER APPLICATION;  Surgeon: Shack Garnette, MD;  Location: [facility];  Service: Urology;  Laterality: N/A;   INGUINAL HERNIA REPAIR Left 07/13/2016   Procedure: REPAIR LEFT INGUINAL HERNIA;  Surgeon: Krystal Spinner, MD;  [facility];  Service: General;  Laterality: Left;   INSERTION OF MESH Left 07/13/2016   Procedure: INSERTION OF MESH;  Surgeon: Krystal Spinner, MD;  [facility];  Service: General;  Laterality: Left;   MELANOMA EXCISION Left 05/05/2007   @[facility]  ;   LEFT FOREARM   TONSILLECTOMY  child   TOTAL HIP ARTHROPLASTY Left 02/11/2021   Procedure: TOTAL HIP ARTHROPLASTY;  Surgeon: Josefina Chew, MD;  [facility];  Service: Orthopedics;  Laterality: Left;   TOTAL SHOULDER ARTHROPLASTY Right 12/29/2017   Procedure: TOTAL SHOULDER ARTHROPLASTY;  Surgeon: Cristy Bonner DASEN, MD;  Location: [facility];  Service: Orthopedics;  Laterality: Right;    MEDICATIONS:  apixaban  (ELIQUIS ) 5 MG TABS tablet   atorvastatin  (LIPITOR) 20 MG tablet   cephALEXin  (KEFLEX ) 500 MG capsule   escitalopram  (LEXAPRO ) 10 MG tablet   LORazepam  (ATIVAN ) 1 MG tablet   losartan  (COZAAR ) 25 MG tablet   melatonin 5 MG TABS   metoprolol  tartrate (LOPRESSOR ) 25 MG tablet   pantoprazole  (PROTONIX ) 40 MG tablet   No current facility-administered medications for this  encounter.    Burnard CHRISTELLA Odis DEVONNA [facility] Short Stay/Anesthesiology [facility] Phone [phone] 09/26/2024 2:13 PM

## 2024-09-27 DIAGNOSIS — N390 Urinary tract infection, site not specified: Secondary | ICD-10-CM | POA: Diagnosis not present

## 2024-09-27 DIAGNOSIS — I209 Angina pectoris, unspecified: Secondary | ICD-10-CM | POA: Diagnosis not present

## 2024-09-27 MED ORDER — GENTAMICIN SULFATE 40 MG/ML IJ SOLN
5.0000 mg/kg | INTRAVENOUS | Status: DC
  Filled 2024-09-27: qty 10.25

## 2024-09-28 ENCOUNTER — Ambulatory Visit (HOSPITAL_COMMUNITY): Admission: RE | Admit: 2024-09-28 | Source: Home / Self Care | Admitting: Urology

## 2024-09-28 ENCOUNTER — Encounter (HOSPITAL_COMMUNITY): Payer: Self-pay | Admitting: Medical

## 2024-09-28 ENCOUNTER — Encounter (HOSPITAL_COMMUNITY): Payer: Self-pay | Admitting: Anesthesiology

## 2024-09-28 ENCOUNTER — Encounter (HOSPITAL_COMMUNITY): Admission: RE | Payer: Self-pay | Source: Home / Self Care

## 2024-09-28 DIAGNOSIS — D519 Vitamin B12 deficiency anemia, unspecified: Secondary | ICD-10-CM | POA: Diagnosis not present

## 2024-09-28 DIAGNOSIS — E785 Hyperlipidemia, unspecified: Secondary | ICD-10-CM | POA: Diagnosis not present

## 2024-09-28 DIAGNOSIS — I10 Essential (primary) hypertension: Secondary | ICD-10-CM | POA: Diagnosis not present

## 2024-09-28 DIAGNOSIS — F0390 Unspecified dementia without behavioral disturbance: Secondary | ICD-10-CM | POA: Diagnosis not present

## 2024-09-28 SURGERY — CYSTOSCOPY, WITH BLADDER CALCULUS LITHOLAPAXY
Anesthesia: General

## 2024-09-29 ENCOUNTER — Other Ambulatory Visit: Payer: Self-pay | Admitting: Family Medicine

## 2024-09-29 DIAGNOSIS — I480 Paroxysmal atrial fibrillation: Secondary | ICD-10-CM

## 2024-10-02 DIAGNOSIS — L603 Nail dystrophy: Secondary | ICD-10-CM | POA: Diagnosis not present

## 2024-10-02 DIAGNOSIS — L851 Acquired keratosis [keratoderma] palmaris et plantaris: Secondary | ICD-10-CM | POA: Diagnosis not present

## 2024-10-02 DIAGNOSIS — R2689 Other abnormalities of gait and mobility: Secondary | ICD-10-CM | POA: Diagnosis not present

## 2024-10-02 DIAGNOSIS — F0390 Unspecified dementia without behavioral disturbance: Secondary | ICD-10-CM | POA: Diagnosis not present

## 2024-10-02 DIAGNOSIS — M79675 Pain in left toe(s): Secondary | ICD-10-CM | POA: Diagnosis not present

## 2024-10-02 DIAGNOSIS — B351 Tinea unguium: Secondary | ICD-10-CM | POA: Diagnosis not present

## 2024-10-02 DIAGNOSIS — M79674 Pain in right toe(s): Secondary | ICD-10-CM | POA: Diagnosis not present

## 2024-10-04 DIAGNOSIS — F0390 Unspecified dementia without behavioral disturbance: Secondary | ICD-10-CM | POA: Diagnosis not present

## 2024-10-04 DIAGNOSIS — E039 Hypothyroidism, unspecified: Secondary | ICD-10-CM | POA: Diagnosis not present

## 2024-10-09 ENCOUNTER — Emergency Department (HOSPITAL_COMMUNITY)

## 2024-10-09 ENCOUNTER — Emergency Department (HOSPITAL_COMMUNITY)
Admission: EM | Admit: 2024-10-09 | Discharge: 2024-10-10 | Disposition: A | Discharge status: Home / Self Care | Attending: Emergency Medicine | Admitting: Emergency Medicine

## 2024-10-09 ENCOUNTER — Other Ambulatory Visit: Payer: Self-pay

## 2024-10-09 ENCOUNTER — Encounter (HOSPITAL_COMMUNITY): Payer: Self-pay

## 2024-10-09 DIAGNOSIS — N3001 Acute cystitis with hematuria: Secondary | ICD-10-CM | POA: Insufficient documentation

## 2024-10-09 DIAGNOSIS — Z7901 Long term (current) use of anticoagulants: Secondary | ICD-10-CM | POA: Insufficient documentation

## 2024-10-09 DIAGNOSIS — K5790 Diverticulosis of intestine, part unspecified, without perforation or abscess without bleeding: Secondary | ICD-10-CM | POA: Diagnosis not present

## 2024-10-09 DIAGNOSIS — I10 Essential (primary) hypertension: Secondary | ICD-10-CM | POA: Insufficient documentation

## 2024-10-09 DIAGNOSIS — I2510 Atherosclerotic heart disease of native coronary artery without angina pectoris: Secondary | ICD-10-CM | POA: Insufficient documentation

## 2024-10-09 DIAGNOSIS — Z79899 Other long term (current) drug therapy: Secondary | ICD-10-CM | POA: Insufficient documentation

## 2024-10-09 DIAGNOSIS — R319 Hematuria, unspecified: Secondary | ICD-10-CM | POA: Diagnosis present

## 2024-10-09 DIAGNOSIS — N200 Calculus of kidney: Secondary | ICD-10-CM | POA: Diagnosis not present

## 2024-10-09 LAB — URINALYSIS, ROUTINE W REFLEX MICROSCOPIC
Bilirubin Urine: NEGATIVE
Glucose, UA: NEGATIVE mg/dL
Ketones, ur: NEGATIVE mg/dL
Nitrite: POSITIVE — AB
Protein, ur: 100 mg/dL — AB
RBC / HPF: >50 RBC/hpf (ref 0–5)
Specific Gravity, Urine: 1.015 (ref 1.005–1.030)
WBC, UA: >50 WBC/hpf (ref 0–5)
pH: 6 (ref 5.0–8.0)

## 2024-10-09 LAB — COMPREHENSIVE METABOLIC PANEL WITH GFR
ALT: 11 U/L (ref 0–44)
AST: 20 U/L (ref 15–41)
Albumin: 4.1 g/dL (ref 3.5–5.0)
Alkaline Phosphatase: 118 U/L (ref 38–126)
Anion gap: 11 (ref 5–15)
BUN: 14 mg/dL (ref 8–23)
CO2: 27 mmol/L (ref 22–32)
Calcium: 9.8 mg/dL (ref 8.9–10.3)
Chloride: 98 mmol/L (ref 98–111)
Creatinine, Ser: 0.87 mg/dL (ref 0.61–1.24)
GFR, Estimated: >60 mL/min (ref >60)
Glucose, Bld: 120 mg/dL — ABNORMAL HIGH (ref 70–99)
Potassium: 4.1 mmol/L (ref 3.5–5.1)
Sodium: 136 mmol/L (ref 135–145)
Total Bilirubin: 1.5 mg/dL — ABNORMAL HIGH (ref 0.0–1.2)
Total Protein: 7.5 g/dL (ref 6.5–8.1)

## 2024-10-09 LAB — CBC WITH DIFFERENTIAL/PLATELET
Abs Immature Granulocytes: 0.07 K/uL (ref 0.00–0.07)
Basophils Absolute: 0 K/uL (ref 0.0–0.1)
Basophils Relative: 1 %
Eosinophils Absolute: 0.1 K/uL (ref 0.0–0.5)
Eosinophils Relative: 1 %
HCT: 45.4 % (ref 39.0–52.0)
Hemoglobin: 15.3 g/dL (ref 13.0–17.0)
Immature Granulocytes: 1 %
Lymphocytes Relative: 14 %
Lymphs Abs: 0.9 K/uL (ref 0.7–4.0)
MCH: 30.8 pg (ref 26.0–34.0)
MCHC: 33.7 g/dL (ref 30.0–36.0)
MCV: 91.3 fL (ref 80.0–100.0)
Monocytes Absolute: 0.5 K/uL (ref 0.1–1.0)
Monocytes Relative: 8 %
Neutro Abs: 4.9 K/uL (ref 1.7–7.7)
Neutrophils Relative %: 75 %
Platelets: 170 K/uL (ref 150–400)
RBC: 4.97 MIL/uL (ref 4.22–5.81)
RDW: 12.7 % (ref 11.5–15.5)
WBC: 6.5 K/uL (ref 4.0–10.5)
nRBC: 0 % (ref 0.0–0.2)

## 2024-10-09 MED ORDER — SULFAMETHOXAZOLE-TRIMETHOPRIM 800-160 MG PO TABS
1.0000 | ORAL_TABLET | Freq: Once | ORAL | Status: AC
Start: 2024-10-09 — End: 2024-10-09
  Administered 2024-10-09: 1 via ORAL
  Filled 2024-10-09: qty 1

## 2024-10-09 MED ORDER — SULFAMETHOXAZOLE-TRIMETHOPRIM 800-160 MG PO TABS: 1.0000 | ORAL_TABLET | Freq: Two times a day (BID) | ORAL | 0 refills | Status: AC

## 2024-10-09 NOTE — ED Notes (Signed)
 [facility] (square) at [phone], no answer x 3 attempts.

## 2024-10-09 NOTE — ED Provider Notes (Signed)
 [place] EMERGENCY DEPARTMENT AT [facility] Provider Note   CSN: [id number] Arrival date & time: 10/09/24  2025     Patient presents with: Hematuria   Joseph Simpson is a 89 y.o. male.  Patient with past history significant for hypertension, coronary artery disease, atrial fibrillation, BPH presents emergency department with reported hematuria.  He was brought in by EMS from [facility] with concerns of hematuria and left flank pain with onset today.  He is currently on Eliquis .  He reportedly has an indwelling Foley catheter but unclear indication.  Unclear last time bag was changed but urine appears to be dirty.  No reported nausea, vomiting, fever, chills, or bodyaches.  Patient is alert and oriented to self.   Hematuria       Prior to Admission medications   Medication Sig Start Date End Date Taking? Authorizing Provider  atorvastatin  (LIPITOR) 20 MG tablet TAKE 1 TABLET BY MOUTH 3 TIMES A WEEK Patient taking differently: Take 20 mg by mouth every Monday, Wednesday, and Friday. 01/20/24   Katrinka Garnette KIDD, MD  cephALEXin  (KEFLEX ) 500 MG capsule Take 1 capsule (500 mg total) by mouth 4 (four) times daily. 09/23/24   Palumbo, April, MD  ELIQUIS  5 MG TABS tablet TAKE 1 TABLET BY MOUTH TWICE A DAY 09/29/24   Katrinka Garnette KIDD, MD  escitalopram  (LEXAPRO ) 10 MG tablet TAKE 1 TABLET BY MOUTH EVERY DAY 03/07/24   Katrinka Garnette KIDD, MD  LORazepam  (ATIVAN ) 1 MG tablet TAKE 1 TAB BY MOUTH 2 TIMES DAILY AS NEEDED FOR ANXIETY*NEEDS TO BE SUPERVISED 8-10HRS AFTER USE-FALL RISK Patient taking differently: Take 1 mg by mouth See admin instructions. Take 1 tablet (1 mg) by mouth in the morning if needed for anxiety & take 1 tablet (1 mg) by mouth scheduled at bedtime. 09/04/24   Katrinka Garnette KIDD, MD  losartan  (COZAAR ) 25 MG tablet TAKE 0.5-1 TAB DAILY. IF BLOOD PRESSURE OVER 145 WHEN YOU CHECK- OK TO TAKE OTHER HALF TABLET ONCE PER DAY (SO TOTAL OF 25MG  PER DAY) Patient taking  differently: Take 12.5 mg by mouth in the morning. 12/07/23   Katrinka Garnette KIDD, MD  melatonin 5 MG TABS Take 5 mg by mouth at bedtime.    [provider]  metoprolol  tartrate (LOPRESSOR ) 25 MG tablet TAKE 1/2 TABLET TWICE A DAY BY MOUTH 09/24/23   Katrinka Garnette KIDD, MD  pantoprazole  (PROTONIX ) 40 MG tablet TAKE 1 TABLET BY MOUTH TWICE A DAY 08/03/23   Katrinka Garnette KIDD, MD  sulfamethoxazole -trimethoprim  (BACTRIM  DS) 800-160 MG tablet Take 1 tablet by mouth 2 (two) times daily for 7 days. 10/09/24 10/16/24 Yes Iyana Topor A, PA-C    Allergies: Tape, Aspirin , Percocet [oxycodone -acetaminophen ], and Vitamins [apatate]    Review of Systems  Genitourinary:  Positive for flank pain and hematuria.  All other systems reviewed and are negative.   Updated Vital Signs BP (!) 162/75   Pulse 64   Temp 97.6 F (36.4 C) (Oral)   Resp 16   Ht 6' (1.829 m)   Wt 81.6 kg   SpO2 97%   BMI 24.40 kg/m   Physical Exam Vitals and nursing note reviewed.  Constitutional:      General: He is not in acute distress.    Appearance: He is well-developed.  HENT:     Head: Normocephalic and atraumatic.  Eyes:     Conjunctiva/sclera: Conjunctivae normal.  Cardiovascular:     Rate and Rhythm: Normal rate and regular rhythm.  Heart sounds: No murmur heard. Pulmonary:     Effort: Pulmonary effort is normal. No respiratory distress.     Breath sounds: Normal breath sounds. No wheezing, rhonchi or rales.  Abdominal:     Palpations: Abdomen is soft.     Tenderness: There is no abdominal tenderness. There is no right CVA tenderness, left CVA tenderness or guarding.  Musculoskeletal:        General: No swelling.     Cervical back: Neck supple.  Skin:    General: Skin is warm and dry.     Capillary Refill: Capillary refill takes less than 2 seconds.  Neurological:     Mental Status: He is alert.  Psychiatric:        Mood and Affect: Mood normal.     (all labs ordered are listed, but only  abnormal results are displayed) Labs Reviewed  COMPREHENSIVE METABOLIC PANEL WITH GFR - Abnormal; Notable for the following components:      Result Value   Glucose, Bld 120 (*)    Total Bilirubin 1.5 (*)    All other components within normal limits  URINALYSIS, ROUTINE W REFLEX MICROSCOPIC - Abnormal; Notable for the following components:   Color, Urine AMBER (*)    APPearance CLOUDY (*)    Hgb urine dipstick LARGE (*)    Protein, ur 100 (*)    Nitrite POSITIVE (*)    Leukocytes,Ua MODERATE (*)    Bacteria, UA MANY (*)    All other components within normal limits  URINE CULTURE  CBC WITH DIFFERENTIAL/PLATELET    EKG: None  Radiology: CT Renal Stone Study Result Date: 10/09/2024 EXAM: CT ABDOMEN AND PELVIS WITHOUT CONTRAST 10/09/2024 10:17:00 PM TECHNIQUE: CT of the abdomen and pelvis was performed without the administration of intravenous contrast. Multiplanar reformatted images are provided for review. Automated exposure control, iterative reconstruction, and/or weight-based adjustment of the mA/kV was utilized to reduce the radiation dose to as low as reasonably achievable. COMPARISON: 09/23/2024 CLINICAL HISTORY: Hematuria and left-sided flank pain. FINDINGS: LOWER CHEST: Chronic interstitial changes are noted in the bases bilaterally. No focal infiltrate is seen. LIVER: The liver is within normal limits. GALLBLADDER AND BILE DUCTS: The gallbladder is within normal limits. No biliary ductal dilatation. SPLEEN: The spleen is unremarkable. PANCREAS: The pancreas is unremarkable. ADRENAL GLANDS: The adrenal glands are within normal limits. KIDNEYS, URETERS AND BLADDER: Bilateral nonobstructing renal calculi are seen. The largest of these on the left measures 3 mm. The largest of these on the right lies in the lower pole, measuring up to 10 mm. No obstructive changes are seen. The bladder is decompressed by a Foley catheter. Multiple posteriorly layering stones are noted within the bladder,  similar to that seen on the prior exam. Wall thickening and perivesicular stranding is noted which may represent some underlying UTI. Correlate with lab values. GI AND BOWEL: Postsurgical changes about the gastroesophageal junction are seen. Small bowel and stomach show no acute abnormality. No obstructive or inflammatory changes of the colon are seen. Diverticular disease is noted without evidence of diverticulitis. The appendix is not well visualized consistent with the surgical history. PERITONEUM AND RETROPERITONEUM: No ascites. No free air. VASCULATURE: Aortic calcifications are seen without an aneurysmal dilatation. LYMPH NODES: No lymphadenopathy. REPRODUCTIVE ORGANS: The prostate is within normal limits. BONES AND SOFT TISSUES: Left hip replacement is noted. Degenerative changes of the lumbar spine are seen. IMPRESSION: 1. Bilateral nonobstructing renal calculi, largest measuring 10 mm in the right lower pole and 3 mm  on the left. 2. Multiple posteriorly layering bladder stones, similar to prior exam. 3. Bladder wall thickening and perivesical stranding, possibly representing underlying urinary tract infection. Recommend correlation with laboratory evaluation and urinalysis. Electronically signed by: Oneil Devonshire MD 10/09/2024 10:30 PM EST RP Workstation: [id number]     Procedures   Medications Ordered in the ED  sulfamethoxazole -trimethoprim  (BACTRIM  DS) 800-160 MG per tablet 1 tablet (1 tablet Oral Given 10/09/24 2348)                                    Medical Decision Making Amount and/or Complexity of Data Reviewed Labs: ordered. Radiology: ordered.  Risk Prescription drug management.   This patient presents to the ED for concern of hematuria, this involves an extensive number of treatment options, and is a complaint that carries with it a high risk of complications and morbidity.  The differential diagnosis includes UTI, pyelonephritis, urolithiasis, bladder cancer,  prostatitis   Co morbidities that complicate the patient evaluation  Coronary artery disease, hypertension, BPH, atrial fibrillation   Additional history obtained:  Additional history obtained from chart review   Lab Tests:  I Ordered, and personally interpreted labs.  The pertinent results include: CBC unremarkable, CMP without signs of AKI or dehydration, UA consistent with infection with large amount of bacteria, leukocytes, and nitrites present.  Urine culture collected and pending.   Imaging Studies ordered:  I ordered imaging studies including CT renal stone study I independently visualized and interpreted imaging which showed Bilateral nonobstructing renal calculi, largest measuring 10 mm in the right lower pole and 3 mm on the left. 2. Multiple posteriorly layering bladder stones, similar to prior exam. 3. Bladder wall thickening and perivesical stranding, possibly representing underlying urinary tract infection. Recommend correlation with laboratory evaluation and urinalysis. I agree with the radiologist interpretation   Consultations Obtained:  I requested consultation with none,  and discussed lab and imaging findings as well as pertinent plan - they recommend: N/A   Problem List / ED Course / Critical interventions / Medication management  Patient with past history significant for hypertension, BPH, atrial fibrillation, CAD, and urinary retention presents ED with concerns of hematuria.  Patient has an indwelling Foley catheter and was brought in by EMS from facility with concerns of hematuria and flank pain.  Reports symptoms started earlier today.  Patient is at his baseline with no increased confusion or alteration with being AO x 1. Physical exam is unremarkable.  Patient has no focal abdominal tenderness or flank tenderness that I can reproduce.  Otherwise well-appearing with normal vitals. Workup is consistent with likely UTI with UA showing bacteria, leukocytes,  nitrites.  CBC and CMP unremarkable.  CT renal stone study negative for acute indications of pyelonephritis or obstructing stone.  Appear to be multiple nonobstructing renal calculi on both right and left sides.  The bladder is thickened likely indicating infection given patient's symptoms and urinalysis. Based on chart review of last urine culture, appears to have sensitivity to Bactrim .  Previously been sent home on Keflex  several weeks ago but no culture obtained at that time. Discussed with patient that he will  be discharged back to his facility with new course of antibiotics. He verbalized understanding. Will contact facility to arrange transportation. I ordered medication including Bactrim  for UTI Reevaluation of the patient after these medicines showed that the patient stayed the same I have reviewed the patients home medicines and  have made adjustments as needed   Social Determinants of Health:  Cognitive impairment   Test / Admission - Considered:  Considered but stable for outpatient follow-up.  Final diagnoses:  Acute cystitis with hematuria    ED Discharge Orders          Ordered    sulfamethoxazole -trimethoprim  (BACTRIM  DS) 800-160 MG tablet  2 times daily        10/09/24 2326               Syaire Saber A, PA-C 10/10/24 0001    Lenor Hollering, MD 10/10/24 1501

## 2024-10-09 NOTE — ED Triage Notes (Addendum)
 PT BIB [facility] from [facility] d/t hematuria and left flank pain onset today - pt is on Eliquis  - Has Foley Catheter - appears dirty and unkept and unknown how long or why he has.  Pt is A& O x 1   BP 140/76 HR 65 97% RA 111 CBG  18 RR

## 2024-10-09 NOTE — Discharge Instructions (Signed)
 You were seen in the ER today for concerns of blood in your urine and side pain. It appears that you have a UTI. I have started you on a course of antibiotics. Please take this as prescribed. Follow up with your primary care provider. Return to the ER for any concerns of new or worsening symptoms.

## 2024-10-10 DIAGNOSIS — I10 Essential (primary) hypertension: Secondary | ICD-10-CM | POA: Diagnosis not present

## 2024-10-10 DIAGNOSIS — R410 Disorientation, unspecified: Secondary | ICD-10-CM | POA: Diagnosis not present

## 2024-10-10 DIAGNOSIS — Z7401 Bed confinement status: Secondary | ICD-10-CM | POA: Diagnosis not present

## 2024-10-10 NOTE — ED Notes (Signed)
 Called [facility] and arranged for pickup to [facility].

## 2024-10-11 ENCOUNTER — Telehealth: Payer: Self-pay

## 2024-10-11 NOTE — Telephone Encounter (Signed)
 Transition Care Management Unsuccessful Follow-up Telephone Call  Date of discharge and from where:  10/10/25; [facility]  Attempts:  1st Attempt  Reason for unsuccessful TCM follow-up call:  No answer/busy; if pt returns call please schedule for ED follow up

## 2024-10-12 LAB — URINE CULTURE
Culture: >=100000 — AB
Special Requests: NORMAL

## 2024-10-13 ENCOUNTER — Telehealth (HOSPITAL_BASED_OUTPATIENT_CLINIC_OR_DEPARTMENT_OTHER): Payer: Self-pay

## 2024-10-13 DIAGNOSIS — Z960 Presence of urogenital implants: Secondary | ICD-10-CM | POA: Diagnosis not present

## 2024-10-13 DIAGNOSIS — Z87440 Personal history of urinary (tract) infections: Secondary | ICD-10-CM | POA: Diagnosis not present

## 2024-10-13 DIAGNOSIS — I10 Essential (primary) hypertension: Secondary | ICD-10-CM | POA: Diagnosis not present

## 2024-10-13 DIAGNOSIS — Z87442 Personal history of urinary calculi: Secondary | ICD-10-CM | POA: Diagnosis not present

## 2024-10-13 DIAGNOSIS — R319 Hematuria, unspecified: Secondary | ICD-10-CM | POA: Diagnosis not present

## 2024-10-13 NOTE — Telephone Encounter (Signed)
 Post ED Visit - Positive Culture Follow-up  Culture report reviewed by antimicrobial stewardship pharmacist: [facility] []  Rankin Dee, Pharm.D. []  Venetia Gully, Pharm.D., BCPS AQ-ID []  Garrel Crews, Pharm.D., BCPS []  Almarie Lunger, Pharm.D., BCPS []  [place], [address].D., BCPS, AAHIVP []  Rosaline Bihari, Pharm.D., BCPS, AAHIVP []  Vernell Meier, PharmD, BCPS []  Latanya Hint, PharmD, BCPS []  Donald Medley, PharmD, BCPS []  Rocky Bold, PharmD []  Dorothyann Alert, PharmD, BCPS []  Morene Babe, PharmD  [facility] [x]  Seena Agent, PharmD []  Romona Bliss, PharmD []  Dolphus Roller, PharmD []  Veva Seip, Rph []  Vernell Daunt) Leonce, PharmD []  Eva Allis, PharmD []  Rosaline Millet, PharmD []  Iantha Batch, PharmD []  Arvin Gauss, PharmD []  Wanda Hasting, PharmD []  Ronal Rav, PharmD []  Rocky Slade, PharmD []  Bard Jeans, PharmD   Positive urine culture Treated with Sulfamethoxazole -Trimethoprim , organism sensitive to the same and no further patient follow-up is required at this time.  Joseph Simpson 10/13/2024, 10:58 AM

## 2024-10-16 DIAGNOSIS — F03C11 Unspecified dementia, severe, with agitation: Secondary | ICD-10-CM | POA: Diagnosis not present

## 2024-10-16 DIAGNOSIS — R451 Restlessness and agitation: Secondary | ICD-10-CM | POA: Diagnosis not present

## 2024-10-16 DIAGNOSIS — F0390 Unspecified dementia without behavioral disturbance: Secondary | ICD-10-CM | POA: Diagnosis not present

## 2024-10-16 DIAGNOSIS — F03C3 Unspecified dementia, severe, with mood disturbance: Secondary | ICD-10-CM | POA: Diagnosis not present

## 2024-10-16 DIAGNOSIS — F32A Depression, unspecified: Secondary | ICD-10-CM | POA: Diagnosis not present

## 2024-10-16 DIAGNOSIS — F411 Generalized anxiety disorder: Secondary | ICD-10-CM | POA: Diagnosis not present

## 2024-10-16 DIAGNOSIS — F5105 Insomnia due to other mental disorder: Secondary | ICD-10-CM | POA: Diagnosis not present

## 2024-10-16 DIAGNOSIS — F03C4 Unspecified dementia, severe, with anxiety: Secondary | ICD-10-CM | POA: Diagnosis not present

## 2024-10-18 ENCOUNTER — Other Ambulatory Visit: Payer: Self-pay | Admitting: Family Medicine

## 2024-10-18 DIAGNOSIS — N200 Calculus of kidney: Secondary | ICD-10-CM | POA: Diagnosis not present

## 2024-10-18 DIAGNOSIS — N3001 Acute cystitis with hematuria: Secondary | ICD-10-CM | POA: Diagnosis not present

## 2024-10-18 DIAGNOSIS — N390 Urinary tract infection, site not specified: Secondary | ICD-10-CM | POA: Diagnosis not present

## 2024-10-18 DIAGNOSIS — Z960 Presence of urogenital implants: Secondary | ICD-10-CM | POA: Diagnosis not present

## 2024-10-23 ENCOUNTER — Emergency Department (HOSPITAL_COMMUNITY)

## 2024-10-23 ENCOUNTER — Inpatient Hospital Stay (HOSPITAL_COMMUNITY)
Admission: EM | Admit: 2024-10-23 | Discharge: 2024-10-27 | DRG: 698 | Disposition: A | Discharge status: Skilled Nursing Facility | Source: Skilled Nursing Facility | Attending: Internal Medicine | Admitting: Internal Medicine

## 2024-10-23 ENCOUNTER — Encounter (HOSPITAL_COMMUNITY): Payer: Self-pay | Admitting: Internal Medicine

## 2024-10-23 DIAGNOSIS — R4189 Other symptoms and signs involving cognitive functions and awareness: Secondary | ICD-10-CM | POA: Diagnosis present

## 2024-10-23 DIAGNOSIS — R9089 Other abnormal findings on diagnostic imaging of central nervous system: Secondary | ICD-10-CM | POA: Diagnosis not present

## 2024-10-23 DIAGNOSIS — F0393 Unspecified dementia, unspecified severity, with mood disturbance: Secondary | ICD-10-CM | POA: Diagnosis present

## 2024-10-23 DIAGNOSIS — Z7901 Long term (current) use of anticoagulants: Secondary | ICD-10-CM | POA: Diagnosis not present

## 2024-10-23 DIAGNOSIS — G9340 Encephalopathy, unspecified: Secondary | ICD-10-CM | POA: Diagnosis not present

## 2024-10-23 DIAGNOSIS — D72829 Elevated white blood cell count, unspecified: Secondary | ICD-10-CM | POA: Diagnosis not present

## 2024-10-23 DIAGNOSIS — R000 Tachycardia, unspecified: Secondary | ICD-10-CM | POA: Diagnosis not present

## 2024-10-23 DIAGNOSIS — R4182 Altered mental status, unspecified: Principal | ICD-10-CM

## 2024-10-23 DIAGNOSIS — I6782 Cerebral ischemia: Secondary | ICD-10-CM | POA: Diagnosis not present

## 2024-10-23 DIAGNOSIS — B9689 Other specified bacterial agents as the cause of diseases classified elsewhere: Secondary | ICD-10-CM | POA: Diagnosis present

## 2024-10-23 DIAGNOSIS — T83511A Infection and inflammatory reaction due to indwelling urethral catheter, initial encounter: Secondary | ICD-10-CM | POA: Diagnosis present

## 2024-10-23 DIAGNOSIS — N179 Acute kidney failure, unspecified: Secondary | ICD-10-CM | POA: Diagnosis present

## 2024-10-23 DIAGNOSIS — G9341 Metabolic encephalopathy: Secondary | ICD-10-CM | POA: Diagnosis present

## 2024-10-23 DIAGNOSIS — N3090 Cystitis, unspecified without hematuria: Secondary | ICD-10-CM

## 2024-10-23 DIAGNOSIS — N390 Urinary tract infection, site not specified: Secondary | ICD-10-CM | POA: Diagnosis present

## 2024-10-23 DIAGNOSIS — E512 Wernicke's encephalopathy: Secondary | ICD-10-CM | POA: Diagnosis present

## 2024-10-23 DIAGNOSIS — I10 Essential (primary) hypertension: Secondary | ICD-10-CM | POA: Diagnosis not present

## 2024-10-23 DIAGNOSIS — I2510 Atherosclerotic heart disease of native coronary artery without angina pectoris: Secondary | ICD-10-CM | POA: Diagnosis present

## 2024-10-23 DIAGNOSIS — R404 Transient alteration of awareness: Secondary | ICD-10-CM | POA: Diagnosis not present

## 2024-10-23 DIAGNOSIS — R29818 Other symptoms and signs involving the nervous system: Secondary | ICD-10-CM | POA: Diagnosis not present

## 2024-10-23 DIAGNOSIS — R9082 White matter disease, unspecified: Secondary | ICD-10-CM | POA: Diagnosis not present

## 2024-10-23 DIAGNOSIS — H9190 Unspecified hearing loss, unspecified ear: Secondary | ICD-10-CM | POA: Diagnosis present

## 2024-10-23 DIAGNOSIS — E039 Hypothyroidism, unspecified: Secondary | ICD-10-CM | POA: Diagnosis present

## 2024-10-23 DIAGNOSIS — K219 Gastro-esophageal reflux disease without esophagitis: Secondary | ICD-10-CM | POA: Diagnosis present

## 2024-10-23 DIAGNOSIS — E785 Hyperlipidemia, unspecified: Secondary | ICD-10-CM | POA: Diagnosis present

## 2024-10-23 DIAGNOSIS — I4891 Unspecified atrial fibrillation: Secondary | ICD-10-CM | POA: Diagnosis present

## 2024-10-23 DIAGNOSIS — F0390 Unspecified dementia without behavioral disturbance: Secondary | ICD-10-CM | POA: Diagnosis not present

## 2024-10-23 DIAGNOSIS — R569 Unspecified convulsions: Secondary | ICD-10-CM | POA: Diagnosis not present

## 2024-10-23 DIAGNOSIS — R0902 Hypoxemia: Secondary | ICD-10-CM | POA: Diagnosis not present

## 2024-10-23 DIAGNOSIS — E538 Deficiency of other specified B group vitamins: Secondary | ICD-10-CM | POA: Diagnosis present

## 2024-10-23 DIAGNOSIS — L89151 Pressure ulcer of sacral region, stage 1: Secondary | ICD-10-CM | POA: Diagnosis present

## 2024-10-23 DIAGNOSIS — F32A Depression, unspecified: Secondary | ICD-10-CM | POA: Diagnosis present

## 2024-10-23 DIAGNOSIS — E870 Hyperosmolality and hypernatremia: Secondary | ICD-10-CM | POA: Diagnosis not present

## 2024-10-23 DIAGNOSIS — R17 Unspecified jaundice: Secondary | ICD-10-CM | POA: Diagnosis present

## 2024-10-23 DIAGNOSIS — D696 Thrombocytopenia, unspecified: Secondary | ICD-10-CM | POA: Diagnosis not present

## 2024-10-23 DIAGNOSIS — N400 Enlarged prostate without lower urinary tract symptoms: Secondary | ICD-10-CM | POA: Diagnosis present

## 2024-10-23 DIAGNOSIS — E860 Dehydration: Secondary | ICD-10-CM | POA: Diagnosis present

## 2024-10-23 LAB — URINALYSIS, ROUTINE W REFLEX MICROSCOPIC
Bilirubin Urine: NEGATIVE
Glucose, UA: NEGATIVE mg/dL
Ketones, ur: 5 mg/dL — AB
Nitrite: POSITIVE — AB
Protein, ur: 100 mg/dL — AB
RBC / HPF: >50 RBC/hpf (ref 0–5)
Specific Gravity, Urine: 1.018 (ref 1.005–1.030)
WBC, UA: >50 WBC/hpf (ref 0–5)
pH: 5 (ref 5.0–8.0)

## 2024-10-23 LAB — CBC WITH DIFFERENTIAL/PLATELET
Abs Immature Granulocytes: 0.05 K/uL (ref 0.00–0.07)
Basophils Absolute: 0 K/uL (ref 0.0–0.1)
Basophils Relative: 0 %
Eosinophils Absolute: 0 K/uL (ref 0.0–0.5)
Eosinophils Relative: 0 %
HCT: 56.8 % — ABNORMAL HIGH (ref 39.0–52.0)
Hemoglobin: 18.9 g/dL — ABNORMAL HIGH (ref 13.0–17.0)
Immature Granulocytes: 0 %
Lymphocytes Relative: 13 %
Lymphs Abs: 1.6 K/uL (ref 0.7–4.0)
MCH: 30.8 pg (ref 26.0–34.0)
MCHC: 33.3 g/dL (ref 30.0–36.0)
MCV: 92.5 fL (ref 80.0–100.0)
Monocytes Absolute: 0.8 K/uL (ref 0.1–1.0)
Monocytes Relative: 6 %
Neutro Abs: 10 K/uL — ABNORMAL HIGH (ref 1.7–7.7)
Neutrophils Relative %: 81 %
Platelets: 213 K/uL (ref 150–400)
RBC: 6.14 MIL/uL — ABNORMAL HIGH (ref 4.22–5.81)
RDW: 13.5 % (ref 11.5–15.5)
WBC: 12.4 K/uL — ABNORMAL HIGH (ref 4.0–10.5)
nRBC: 0 % (ref 0.0–0.2)

## 2024-10-23 LAB — COMPREHENSIVE METABOLIC PANEL WITH GFR
ALT: 21 U/L (ref 0–44)
AST: 24 U/L (ref 15–41)
Albumin: 3.8 g/dL (ref 3.5–5.0)
Alkaline Phosphatase: 86 U/L (ref 38–126)
Anion gap: 16 — ABNORMAL HIGH (ref 5–15)
BUN: 36 mg/dL — ABNORMAL HIGH (ref 8–23)
CO2: 24 mmol/L (ref 22–32)
Calcium: 9.2 mg/dL (ref 8.9–10.3)
Chloride: 104 mmol/L (ref 98–111)
Creatinine, Ser: 1.23 mg/dL (ref 0.61–1.24)
GFR, Estimated: 57 mL/min — ABNORMAL LOW (ref >60)
Glucose, Bld: 122 mg/dL — ABNORMAL HIGH (ref 70–99)
Potassium: 4.1 mmol/L (ref 3.5–5.1)
Sodium: 144 mmol/L (ref 135–145)
Total Bilirubin: 2.1 mg/dL — ABNORMAL HIGH (ref 0.0–1.2)
Total Protein: 7.9 g/dL (ref 6.5–8.1)

## 2024-10-23 LAB — CBG MONITORING, ED: Glucose-Capillary: 116 mg/dL — ABNORMAL HIGH (ref 70–99)

## 2024-10-23 MED ORDER — METOPROLOL TARTRATE 12.5 MG HALF TABLET: 12.5000 mg | ORAL_TABLET | Freq: Two times a day (BID) | ORAL | Status: DC

## 2024-10-23 MED ORDER — ATORVASTATIN CALCIUM 10 MG PO TABS
20.0000 mg | ORAL_TABLET | ORAL | Status: AC
  Administered 2024-10-25 – 2024-10-27 (×2): 20 mg via ORAL
  Filled 2024-10-23 (×2): qty 2

## 2024-10-23 MED ORDER — METOPROLOL TARTRATE 12.5 MG HALF TABLET
12.5000 mg | ORAL_TABLET | Freq: Two times a day (BID) | ORAL | Status: AC
  Administered 2024-10-24 – 2024-10-27 (×6): 12.5 mg via ORAL
  Filled 2024-10-23 (×7): qty 1

## 2024-10-23 MED ORDER — PANTOPRAZOLE SODIUM 40 MG PO TBEC: 40.0000 mg | DELAYED_RELEASE_TABLET | Freq: Two times a day (BID) | ORAL | Status: DC

## 2024-10-23 MED ORDER — PANTOPRAZOLE SODIUM 40 MG IV SOLR
40.0000 mg | Freq: Once | INTRAVENOUS | Status: AC
  Administered 2024-10-23: 40 mg via INTRAVENOUS
  Filled 2024-10-23: qty 10

## 2024-10-23 MED ORDER — ESCITALOPRAM OXALATE 10 MG PO TABS
10.0000 mg | ORAL_TABLET | Freq: Every day | ORAL | Status: DC
  Administered 2024-10-25 – 2024-10-27 (×3): 10 mg via ORAL
  Filled 2024-10-23 (×4): qty 1

## 2024-10-23 MED ORDER — APIXABAN 5 MG PO TABS
5.0000 mg | ORAL_TABLET | Freq: Two times a day (BID) | ORAL | Status: AC
  Administered 2024-10-24 – 2024-10-27 (×6): 5 mg via ORAL
  Filled 2024-10-23 (×7): qty 1

## 2024-10-23 MED ORDER — SODIUM CHLORIDE 0.9 % IV SOLN
2.0000 g | Freq: Once | INTRAVENOUS | Status: AC
  Administered 2024-10-23: 2 g via INTRAVENOUS
  Filled 2024-10-23: qty 20

## 2024-10-23 MED ORDER — LORAZEPAM 1 MG PO TABS: 1.0000 mg | ORAL_TABLET | Freq: Two times a day (BID) | ORAL | Status: DC | PRN

## 2024-10-23 MED ORDER — PANTOPRAZOLE SODIUM 40 MG PO TBEC
40.0000 mg | DELAYED_RELEASE_TABLET | Freq: Two times a day (BID) | ORAL | Status: AC
  Administered 2024-10-24 – 2024-10-27 (×6): 40 mg via ORAL
  Filled 2024-10-23 (×7): qty 1

## 2024-10-23 MED ORDER — METOPROLOL TARTRATE 5 MG/5ML IV SOLN
1.2500 mg | Freq: Once | INTRAVENOUS | Status: AC
  Administered 2024-10-23: 1.3 mg via INTRAVENOUS
  Filled 2024-10-23: qty 5

## 2024-10-23 MED ORDER — ENOXAPARIN SODIUM 40 MG/0.4ML IJ SOSY: 40.0000 mg | PREFILLED_SYRINGE | INTRAMUSCULAR | Status: DC

## 2024-10-23 MED ORDER — IBUPROFEN 400 MG PO TABS: 400.0000 mg | ORAL_TABLET | Freq: Four times a day (QID) | ORAL | Status: DC | PRN

## 2024-10-23 MED ORDER — LEVOTHYROXINE SODIUM 25 MCG PO TABS
25.0000 ug | ORAL_TABLET | Freq: Every day | ORAL | Status: DC
  Administered 2024-10-25 – 2024-10-27 (×2): 25 ug via ORAL
  Filled 2024-10-23 (×4): qty 1

## 2024-10-23 NOTE — ED Triage Notes (Signed)
 [facility] from memory care unit of ALF for reports of patient not responding to staff outside of baseline mental status. Patient has h/o dementia, not verbally responsive but does turn when he hears his name and follows motor commands appropriately. Patient calm and cooperative upon arrival to ED.

## 2024-10-23 NOTE — ED Provider Notes (Signed)
  [place] EMERGENCY DEPARTMENT AT [facility] Provider Assume Care Note I assumed care of ELIS SAUBER on 10/23/2024 at 3 PM from Dr. Charlyn.   Briefly, BENJAMEN KOELLING is a 89 y.o. male who: PMHx: CAD, HTN, GERD, macular degeneration, BPH, HLD, A-fib on Eliquis , dementia P/w altered mental status Per nursing home, patient's baseline is pleasantly confused, however conversational, last known normal yesterday around dinner Today workup and has not been able to verbalize any words, persistently pointing at his head Has chronic Foley, this is being exchanged and UA is being drawn off of new Foley catheter  Clinical Course as of 10/23/24 1608  Mon Oct 23, 2024  1454 Nursing home, needs MRI, 42, here w AMS, afib on eliquis , dementia, normal able to speak, pleasant, normal yesterday around dinner, averbal today and pointing at head, maybe some absance sz, has chronic foley, getting UA, mri then admit  [LS]    Clinical Course User Index [LS] Rogelia Jerilynn RAMAN, MD    Plan at the time of handoff: Follow-up labs, MRI head, anticipate admission   Please refer to the original provider's note for additional information regarding the care of Lynwood JONELLE Sayres.  Reassessment: I personally reassessed the patient: Patient points to his head repeatedly when spoken to, does not provide any verbal interaction  Vital Signs:  ED Triage Vitals [10/23/24 1026]  Encounter Vitals Group     BP (!) 160/89     Girls Systolic BP Percentile      Girls Diastolic BP Percentile      Boys Systolic BP Percentile      Boys Diastolic BP Percentile      Pulse Rate (!) 101     Resp 16     Temp 98.5 F (36.9 C)     Temp Source Oral     SpO2 100 %     Weight      Height      Head Circumference      Peak Flow      Pain Score      Pain Loc      Pain Education      Exclude from Growth Chart      Hemodynamics:  The patient is hemodynamically stable. Mental Status:  The patient is nonverbal but  alert  Additional MDM: UA returned with evidence of UTI with positive LE, nitrites, numerous WBCs, thus urine culture, blood culture and ceftriaxone  ordered.  Patient remains nonverbal, on my reevaluation, patient points at his head, however unclear what patient is trying to communicate.  MRI with nonspecific findings of toxic/metabolic encephalopathy, given patient is persistently altered, do feel that patient warrants admission for further workup and monitoring and observation as to if patient's altered mental status improves/resolves as he is treated for UTI.  Hospitalist consulted, discussed with Dr. Dena, who accepted the patient to his service.  Disposition: ADMIT: I believe the patient requires admission for further care and management. The patient was admitted to hospitalists. Please see inpatient provider note for additional treatment plan details.    FREDRIK CANDIE Rogelia, MD Emergency Medicine    Rogelia Jerilynn RAMAN, MD 10/23/24 [phone]

## 2024-10-23 NOTE — ED Provider Notes (Signed)
  EMERGENCY DEPARTMENT AT [facility] Provider Note   CSN: [id number] Arrival date & time: 10/23/24  1022     Patient presents with: Altered Mental Status   Joseph Simpson is a 89 y.o. male.  {Add pertinent medical, surgical, social history, OB history to HPI:32947} HPI     89 year old male with history of CAD, A-fib on Eliquis , dementia comes in with chief complaint of altered mental status from nursing home.  I have called recently [facility] to get collateral history. Per nursing home - Typically patient able to ambulate and speak, but today he follows commands but was unable to speak. He kept on pointing to his head. Last known normal - 9 pm when he had dinner.    Prior to Admission medications   Medication Sig Start Date End Date Taking? Authorizing Provider  atorvastatin  (LIPITOR) 20 MG tablet TAKE 1 TABLET BY MOUTH 3 TIMES A WEEK Patient taking differently: Take 20 mg by mouth every Monday, Wednesday, and Friday. 01/20/24   Katrinka Garnette KIDD, MD  cephALEXin  (KEFLEX ) 500 MG capsule Take 1 capsule (500 mg total) by mouth 4 (four) times daily. 09/23/24   Palumbo, April, MD  ELIQUIS  5 MG TABS tablet TAKE 1 TABLET BY MOUTH TWICE A DAY 09/29/24   Katrinka Garnette KIDD, MD  escitalopram  (LEXAPRO ) 10 MG tablet TAKE 1 TABLET BY MOUTH EVERY DAY 03/07/24   Katrinka Garnette KIDD, MD  LORazepam  (ATIVAN ) 1 MG tablet TAKE 1 TAB BY MOUTH 2 TIMES DAILY AS NEEDED FOR ANXIETY*NEEDS TO BE SUPERVISED 8-10HRS AFTER USE-FALL RISK Patient taking differently: Take 1 mg by mouth See admin instructions. Take 1 tablet (1 mg) by mouth in the morning if needed for anxiety & take 1 tablet (1 mg) by mouth scheduled at bedtime. 09/04/24   Katrinka Garnette KIDD, MD  losartan  (COZAAR ) 25 MG tablet TAKE 0.5-1 TAB DAILY. IF BLOOD PRESSURE OVER 145 WHEN YOU CHECK- OK TO TAKE OTHER HALF TABLET ONCE PER DAY (SO TOTAL OF 25MG  PER DAY) Patient taking differently: Take 12.5 mg by mouth in the morning. 12/07/23    Katrinka Garnette KIDD, MD  melatonin 5 MG TABS Take 5 mg by mouth at bedtime.    [provider]  metoprolol  tartrate (LOPRESSOR ) 25 MG tablet TAKE 1/2 TABLET BY MOUTH TWICE A DAY 10/18/24   Katrinka Garnette KIDD, MD  pantoprazole  (PROTONIX ) 40 MG tablet TAKE 1 TABLET BY MOUTH TWICE A DAY 10/18/24   Katrinka Garnette KIDD, MD    Allergies: Tape, Aspirin , Percocet [oxycodone -acetaminophen ], and Vitamins [apatate]    Review of Systems  Updated Vital Signs BP (!) 160/89 (BP Location: Right Arm)   Pulse (!) 101   Temp 98.5 F (36.9 C) (Oral)   Resp 16   SpO2 100%   Physical Exam  (all labs ordered are listed, but only abnormal results are displayed) Labs Reviewed  CBC WITH DIFFERENTIAL/PLATELET  COMPREHENSIVE METABOLIC PANEL WITH GFR  URINALYSIS, ROUTINE W REFLEX MICROSCOPIC  CBG MONITORING, ED    EKG: None  Radiology: No results found.  {Document cardiac monitor, telemetry assessment procedure when appropriate:32947} .Critical Care  Performed by: Charlyn Sora, MD Authorized by: Charlyn Sora, MD   Critical care provider statement:    Critical care time (minutes):  48   Critical care was necessary to treat or prevent imminent or life-threatening deterioration of the following conditions:  CNS failure or compromise   Critical care was time spent personally by me on the following activities:  Development of treatment plan with patient or surrogate, discussions with consultants, evaluation of patient's response to treatment, examination of patient, ordering and review of laboratory studies, ordering and review of radiographic studies, ordering and performing treatments and interventions, pulse oximetry, re-evaluation of patient's condition, review of old charts and obtaining history from patient or surrogate    Medications Ordered in the ED - No data to display  Clinical Course as of 10/23/24 1608  Mon Oct 23, 2024  1454 Nursing home, needs MRI, 58, here w AMS, afib on  eliquis , dementia, normal able to speak, pleasant, normal yesterday around dinner, averbal today and pointing at head, maybe some absance sz, has chronic foley, getting UA, mri then admit  [LS]    Clinical Course User Index [LS] Rogelia Jerilynn RAMAN, MD   {Click here for ABCD2, HEART and other calculators REFRESH Note before signing:1}                              Medical Decision Making Amount and/or Complexity of Data Reviewed Labs: ordered. Radiology: ordered.   89 year old patient with pertinent past medical history of acute altered mental status who comes in with chief complaint of acute altered mental status change.  Collateral history provided by nursing home.  I have reviewed patient's previous records including the medications.  Differential diagnosis considered for this patient includes: ICH / Stroke, acute coronary syndrome, Infection - UTI/Pneumonia/soft tissue infection leading to encephalopathy, encephalopathy due to electrolyte abnormality or drug interactions or toxins or metabolic conditions like adrenal insufficiency, hyperglycemia, paraneoplastic process  Based on initial assessment, higher suspicion for acute encephalopathy because of infection or because of stroke.  Patient unable to express himself, therefore we will get CT scan of the head, which if negative will get MRI.  Basic labs will be also ordered to evaluate for metabolic or infectious cause for encephalopathy.  Patient will need admission to the hospital.  Reassessment: I have independently reviewed the following labs:  I have independently reviewed the following imaging:  Patient was also reassessed at *** Based on the overall workup, suspicion is high for *** Disposition: ***  Final diagnoses:  None    ED Discharge Orders     None

## 2024-10-23 NOTE — ED Notes (Signed)
 Floor was called to inform them that pt is coming up

## 2024-10-23 NOTE — H&P (Signed)
 History and Physical    Joseph Simpson FMW:[id number] DOB: 1935-08-26 DOA: 10/23/2024  PCP: Katrinka Garnette KIDD, MD   Chief Complaint:  lorre  HPI: LUCIUS Simpson is a 89 y.o. male with medical history significant of with history of atrial fibrillation, dementia, hyperlipidemia who presented to the Emergency Department due to confusion.  Patient has a chronic Foley catheter and lives in a nursing home.  Was normal yesterday around dinnertime however became nonverbal and confused.  He presents emergency department where he was found to be afebrile hemodynamically.  Labs were obtained on presentation which showed WBC 12.4, hemoglobin 18.9, urinalysis concerning for infection.  Patient was started on ceftriaxone .  CT head was obtained which showed no acute findings.  MR head was obtained which showed hyperintensity involving the medial thalamus and midbrain concerning for Warnicke's versus toxic metabolic etiologies.  Patient was admitted for further workup. On evaluation patient was largely nonresponsive. No focal deficits.    Review of Systems: Review of Systems  Constitutional:  Negative for chills and fever.  HENT: Negative.    Eyes: Negative.   Respiratory: Negative.    Cardiovascular: Negative.   Gastrointestinal: Negative.   Genitourinary: Negative.   Musculoskeletal: Negative.   Skin: Negative.   All other systems reviewed and are negative.    As per HPI otherwise 10 point review of systems negative.   Allergies  Allergen Reactions   Tape Other (See Comments)    SKIN IS FRAGILE AND WILL TEAR EASILY- PAPER TAPE ONLY   Aspirin  Other (See Comments)    UPSETS THE STOMACH   Percocet [Oxycodone -Acetaminophen ] Nausea And Vomiting   Vitamins [Apatate] Other (See Comments)    Bothers the stomach    Past Medical History:  Diagnosis Date   Aortic atherosclerosis    Arthritis    Atrial fibrillation (HCC)    B12 deficiency    Bladder calculi    CAD (coronary artery disease)     cardiologist--- dr jordan;   abnormal nuclear stress test 11/ 2013;  s/p cardiac cath 10-21-2012 minimal nonobstructiive CAD w/ normal LVF   Cancer (HCC)    skin cancer   Dementia (HCC)    Depression    Diverticulosis    Dyslipidemia    Esophagitis    Full dentures    Gastroparesis    followed by Massie Beagle PA ( [facility] in [place]) s/p gastrectomy for ulcers in 1970s   GERD (gastroesophageal reflux disease)    Heart murmur    Hiatal hernia    History of adenomatous polyp of colon    History of kidney stones    History of melanoma 2008   s/p left forearn wide local excision 06/ 2008   History of syncope 2010   cardiologist --- dr jordan;  (11-25-2021 per pt no syncope or near syncope since)   Hypertension    Macular degeneration of both eyes    Nocturia associated with benign prostatic hyperplasia    OSA (obstructive sleep apnea)    no cpap   Polyneuropathy    Pre-diabetes    PVC's (premature ventricular contractions) 2010   06/ 2010 per event monitor SR w/ occasional PVCs and 4 beat SVT (in epic)   Urinary retention    Wears glasses     Past Surgical History:  Procedure Laterality Date   APPENDECTOMY     child   BIOPSY  11/02/2022   Procedure: BIOPSY;  Surgeon: Aneita Gwendlyn DASEN, MD;  Location: [facility];  Service: Gastroenterology;;  CARDIAC CATHETERIZATION  10/2012   minimal nonobstructive CAD with normal LV function   CATARACT EXTRACTION W/ INTRAOCULAR LENS IMPLANT Bilateral    2009 approx   CYSTOSCOPY WITH LITHOLAPAXY N/A 07/13/2016   Procedure: CYSTOSCOPY WITH LITHOLAPAXY;  Surgeon: Garnette Shack, MD;  Location: [facility];  Service: Urology;  Laterality: N/A;   CYSTOSCOPY WITH LITHOLAPAXY N/A 12/01/2021   Procedure: CYSTOSCOPY WITH LITHOLAPAXY;  Surgeon: Shack Garnette, MD;  Location[facility];  Service: Urology;  Laterality: N/A;   CYSTOSCOPY/RETROGRADE/URETEROSCOPY/STONE EXTRACTION WITH BASKET  02/24/2012   Procedure:  CYSTOSCOPY/RETROGRADE/URETEROSCOPY/STONE EXTRACTION WITH BASKET;  Surgeon: Garnette Shack, MD;  Location: [facility];  Service: Urology;  Laterality: Bilateral;  1 HOUR   ESOPHAGOGASTRODUODENOSCOPY N/A 01/06/2022   Procedure: ESOPHAGOGASTRODUODENOSCOPY (EGD);  Surgeon: Burnette Fallow, MD;  Location: [facility];  Service: Gastroenterology;  Laterality: N/A;   ESOPHAGOGASTRODUODENOSCOPY (EGD) WITH PROPOFOL  N/A 11/02/2022   Procedure: ESOPHAGOGASTRODUODENOSCOPY (EGD) WITH PROPOFOL ;  Surgeon: Aneita Gwendlyn DASEN, MD;  Location: [facility];  Service: Gastroenterology;  Laterality: N/A;   EXTRACORPOREAL SHOCK WAVE LITHOTRIPSY  11/16/2011   @[facility]    GASTRECTOMY     1970s  for ulcers   HEMORRHOID SURGERY     yrs ago   HOLMIUM LASER APPLICATION N/A 12/01/2021   Procedure: HOLMIUM LASER APPLICATION;  Surgeon: Shack Garnette, MD;  Location[facility];  Service: Urology;  Laterality: N/A;   INGUINAL HERNIA REPAIR Left 07/13/2016   Procedure: REPAIR LEFT INGUINAL HERNIA;  Surgeon: Krystal Spinner, MD;  Location: [facility];  Service: General;  Laterality: Left;   INSERTION OF MESH Left 07/13/2016   Procedure: INSERTION OF MESH;  Surgeon: Krystal Spinner, MD;  Location: [facility];  Service: General;  Laterality: Left;   MELANOMA EXCISION Left 05/05/2007   @[facility]  ;   LEFT FOREARM   TONSILLECTOMY     child   TOTAL HIP ARTHROPLASTY Left 02/11/2021   Procedure: TOTAL HIP ARTHROPLASTY;  Surgeon: Josefina Chew, MD;  Location: [facility];  Service: Orthopedics;  Laterality: Left;   TOTAL SHOULDER ARTHROPLASTY Right 12/29/2017   Procedure: TOTAL SHOULDER ARTHROPLASTY;  Surgeon: Cristy Bonner DASEN, MD;  Location: [facility];  Service: Orthopedics;  Laterality: Right;     reports that he quit smoking about 68 years ago. His smoking use included cigarettes. He started smoking about 72 years ago. He has a 0.4 pack-year smoking history. He has never used smokeless tobacco. He reports that he does not drink  alcohol  and does not use drugs.  Family History  Problem Relation Age of Onset   Heart disease Mother        CABG in her 72s   Pneumonia Father    Cancer Sister        breast   Lung cancer Brother        smoker   Colon cancer Neg Hx    Esophageal cancer Neg Hx    Liver cancer Neg Hx    Pancreatic cancer Neg Hx    Rectal cancer Neg Hx     Prior to Admission medications   Medication Sig Start Date End Date Taking? Authorizing Provider  atorvastatin  (LIPITOR) 20 MG tablet TAKE 1 TABLET BY MOUTH 3 TIMES A WEEK Patient taking differently: Take 20 mg by mouth every Monday, Wednesday, and Friday. 01/20/24  Yes Katrinka Garnette KIDD, MD  cephALEXin  (KEFLEX ) 500 MG capsule Take 1 capsule (500 mg total) by mouth 4 (four) times daily. Patient taking differently: Take 500 mg by mouth daily. Starting: 10/14/24 for  10 days 09/23/24  Yes Palumbo, April, MD  ELIQUIS  5 MG TABS tablet TAKE 1 TABLET BY MOUTH TWICE A DAY 09/29/24  Yes Katrinka Garnette KIDD, MD  escitalopram  (LEXAPRO ) 10 MG tablet TAKE 1 TABLET BY MOUTH EVERY DAY 03/07/24  Yes Katrinka Garnette KIDD, MD  levothyroxine  (SYNTHROID ) 25 MCG tablet Take 25 mcg by mouth daily. 10/13/24  Yes [provider]  LORazepam  (ATIVAN ) 1 MG tablet TAKE 1 TAB BY MOUTH 2 TIMES DAILY AS NEEDED FOR ANXIETY*NEEDS TO BE SUPERVISED 8-10HRS AFTER USE-FALL RISK Patient taking differently: Take 1 mg by mouth See admin instructions. Take 1 tablet (1 mg) by mouth in the morning if needed for anxiety & take 1 tablet (1 mg) by mouth scheduled at bedtime. 09/04/24  Yes Katrinka Garnette KIDD, MD  losartan  (COZAAR ) 25 MG tablet TAKE 0.5-1 TAB DAILY. IF BLOOD PRESSURE OVER 145 WHEN YOU CHECK- OK TO TAKE OTHER HALF TABLET ONCE PER DAY (SO TOTAL OF 25MG  PER DAY) Patient taking differently: Take 12.5 mg by mouth in the morning. 12/07/23  Yes Katrinka Garnette KIDD, MD  melatonin 5 MG TABS Take 5 mg by mouth at bedtime.   Yes [provider]  metoprolol  tartrate (LOPRESSOR ) 25 MG  tablet TAKE 1/2 TABLET BY MOUTH TWICE A DAY 10/18/24  Yes Katrinka Garnette KIDD, MD  pantoprazole  (PROTONIX ) 40 MG tablet TAKE 1 TABLET BY MOUTH TWICE A DAY 10/18/24  Yes Katrinka Garnette KIDD, MD    Physical Exam: Vitals:   10/23/24 1245 10/23/24 1430 10/23/24 1530 10/23/24 1832  BP: 133/81 130/85 128/79   Pulse: 73 94 80   Resp: 19 17 18    Temp:    98.5 F (36.9 C)  TempSrc:    Oral  SpO2: 100% 100% 100%    Physical Exam HENT:     Head: Normocephalic.     Nose: Nose normal.     Mouth/Throat:     Mouth: Mucous membranes are dry.  Eyes:     Conjunctiva/sclera: Conjunctivae normal.     Pupils: Pupils are equal, round, and reactive to light.  Cardiovascular:     Rate and Rhythm: Normal rate and regular rhythm.  Pulmonary:     Effort: Pulmonary effort is normal.  Abdominal:     General: Abdomen is flat.  Musculoskeletal:        General: Normal range of motion.     Cervical back: Normal range of motion.  Skin:    General: Skin is warm.     Capillary Refill: Capillary refill takes less than 2 seconds.  Neurological:     Mental Status: He is alert. He is disoriented.     Motor: Weakness present.  Psychiatric:        Mood and Affect: Mood normal.        Labs on Admission: I have personally reviewed the patients's labs and imaging studies.  Assessment/Plan Principal Problem:   Encephalopathy acute   # Acute encephalopathy most likely infectious vs Wernickes vs metabolic - Patient's Foley catheter - Change in mentation - No focal deficits - MRI showed hyperintensities in thalamus -nonresponsive without focal deficits.   Plan: Replete thiamine  Continue ceftriaxone  Consult neurology  # A-fib-continue Eliquis   # GERD-continue Protonix   # Cognitive impairment-continue home Ativan   # Hypothyroidism-continue Synthroid   # Depression-continue Lexapro   # Hyperlipidemia-continue Lipitor  Admission status: Inpatient Med-Surg  Certification: The appropriate patient  status for this patient is INPATIENT. Inpatient status is judged to be reasonable and necessary in order to provide  the required intensity of service to ensure the patient's safety. The patient's presenting symptoms, physical exam findings, and initial radiographic and laboratory data in the context of their chronic comorbidities is felt to place them at high risk for further clinical deterioration. Furthermore, it is not anticipated that the patient will be medically stable for discharge from the hospital within 2 midnights of admission.   * I certify that at the point of admission it is my clinical judgment that the patient will require inpatient hospital care spanning beyond 2 midnights from the point of admission due to high intensity of service, high risk for further deterioration and high frequency of surveillance required.DEWAINE Lamar Dess MD [facility] If 7PM-7AM, please contact night-coverage [email or web address]  10/23/2024, 6:47 PM

## 2024-10-23 NOTE — Progress Notes (Signed)
 Patient arrived to the unit on stretcher, patient not responsive with eyes open. Son at the bedside and gave some hx of patient. Pt assessed and is resting in bed at the lowest point, bed alarm on, and floor mats down. No concerns noted at this time.

## 2024-10-24 ENCOUNTER — Inpatient Hospital Stay (HOSPITAL_COMMUNITY)

## 2024-10-24 LAB — TSH: TSH: 3.035 u[IU]/mL (ref 0.350–4.500)

## 2024-10-24 LAB — VITAMIN B12: Vitamin B-12: 1584 pg/mL — ABNORMAL HIGH (ref 180–914)

## 2024-10-24 LAB — HIV ANTIBODY (ROUTINE TESTING W REFLEX): HIV Screen 4th Generation wRfx: NONREACTIVE

## 2024-10-24 MED ORDER — THIAMINE HCL 100 MG/ML IJ SOLN
500.0000 mg | Freq: Three times a day (TID) | INTRAVENOUS | Status: AC
  Administered 2024-10-24 – 2024-10-25 (×6): 500 mg via INTRAVENOUS
  Filled 2024-10-24: qty 500
  Filled 2024-10-24 (×4): qty 5
  Filled 2024-10-24: qty 500

## 2024-10-24 MED ORDER — CHLORHEXIDINE GLUCONATE CLOTH 2 % EX PADS
6.0000 | MEDICATED_PAD | Freq: Every day | CUTANEOUS | Status: DC
  Administered 2024-10-24 – 2024-10-27 (×4): 6 via TOPICAL

## 2024-10-24 MED ORDER — SODIUM CHLORIDE 0.9 % IV SOLN: 2.0000 g | INTRAVENOUS | Status: DC

## 2024-10-24 MED ORDER — LEVOFLOXACIN IN D5W 750 MG/150ML IV SOLN
750.0000 mg | INTRAVENOUS | Status: DC
  Administered 2024-10-24: 750 mg via INTRAVENOUS
  Filled 2024-10-24: qty 150

## 2024-10-24 MED ORDER — SULFAMETHOXAZOLE-TRIMETHOPRIM 800-160 MG PO TABS: 1.0000 | ORAL_TABLET | Freq: Two times a day (BID) | ORAL | Status: DC

## 2024-10-24 MED ORDER — SULFAMETHOXAZOLE-TRIMETHOPRIM 400-80 MG/5ML IV SOLN
160.0000 mg | Freq: Two times a day (BID) | INTRAVENOUS | Status: DC
  Filled 2024-10-24 (×2): qty 10

## 2024-10-24 NOTE — Consult Note (Incomplete)
 NEUROLOGY CONSULT NOTE   Date of service: October 24, 2024 Patient Name: Joseph Simpson MRN:  [id number] DOB:  [birth year] Chief Complaint: AMS Requesting Provider: Dena Charleston, MD  History of Present Illness  Joseph Simpson is a 89 y.o. male with who has a PMHx of has a past medical history of dementia, aortic atherosclerosis, Arthritis, Atrial fibrillation (on Eliquis ), B12 deficiency, CAD , Cancer, Depression, Diverticulosis, Dyslipidemia, Esophagitis, Gastroparesis, GERD, Heart murmur, Hiatal hernia, History of adenomatous polyp of colon, kidney stones, melanoma (2008), syncope (2010), Hypertension, Macular degeneration of both eyes, Nocturia associated with benign prostatic hyperplasia, OSA, Polyneuropathy, Pre-diabetes, PVC's and urinary retention, who was BIB EMS from the memory care unit of his ALF for reports of patient not responding to staff appropriately relative to his baseline mental status. On arrival to the ED, he was not verbally responsive but did turn when he heard his name and followed motor commands. MRI was obtained, revealing abnormal FLAIR hyperintensity involving the medial thalami and midbrain, including the periaqueductal gray matter and tectum; findings concerning for Wernicke encephalopathy versus other toxic/metabolic etiologies as well as Creutzfeldt-Jakob disease. Neurology has been consulted to further evaluate.   ROS  Unable to obtain due to nonverbal state. //***  Past History   Past Medical History:  Diagnosis Date   Aortic atherosclerosis    Arthritis    Atrial fibrillation (HCC)    B12 deficiency    Bladder calculi    CAD (coronary artery disease)    cardiologist--- dr jordan;   abnormal nuclear stress test 11/ 2013;  s/p cardiac cath 10-21-2012 minimal nonobstructiive CAD w/ normal LVF   Cancer (HCC)    skin cancer   Dementia (HCC)    Depression    Diverticulosis    Dyslipidemia    Esophagitis    Full dentures    Gastroparesis    followed by  Massie Beagle PA ( [facility] in [place]) s/p gastrectomy for ulcers in 1970s   GERD (gastroesophageal reflux disease)    Heart murmur    Hiatal hernia    History of adenomatous polyp of colon    History of kidney stones    History of melanoma 2008   s/p left forearn wide local excision 06/ 2008   History of syncope 2010   cardiologist --- dr jordan;  (11-25-2021 per pt no syncope or near syncope since)   Hypertension    Macular degeneration of both eyes    Nocturia associated with benign prostatic hyperplasia    OSA (obstructive sleep apnea)    no cpap   Polyneuropathy    Pre-diabetes    PVC's (premature ventricular contractions) 2010   06/ 2010 per event monitor SR w/ occasional PVCs and 4 beat SVT (in epic)   Urinary retention    Wears glasses     Past Surgical History:  Procedure Laterality Date   APPENDECTOMY     child   BIOPSY  11/02/2022   Procedure: BIOPSY;  Surgeon: Aneita Gwendlyn DASEN, MD;  Location: [facility];  Service: Gastroenterology;;   CARDIAC CATHETERIZATION  10/2012   minimal nonobstructive CAD with normal LV function   CATARACT EXTRACTION W/ INTRAOCULAR LENS IMPLANT Bilateral    2009 approx   CYSTOSCOPY WITH LITHOLAPAXY N/A 07/13/2016   Procedure: CYSTOSCOPY WITH LITHOLAPAXY;  Surgeon: Garnette Shack, MD;  Location: [facility];  Service: Urology;  Laterality: N/A;   CYSTOSCOPY WITH LITHOLAPAXY N/A 12/01/2021   Procedure: CYSTOSCOPY WITH LITHOLAPAXY;  Surgeon: Shack Garnette, MD;  Location: [facility];  Service: Urology;  Laterality: N/A;   CYSTOSCOPY/RETROGRADE/URETEROSCOPY/STONE EXTRACTION WITH BASKET  02/24/2012   Procedure: CYSTOSCOPY/RETROGRADE/URETEROSCOPY/STONE EXTRACTION WITH BASKET;  Surgeon: Garnette Shack, MD;  Location: [facility];  Service: Urology;  Laterality: Bilateral;  1 HOUR   ESOPHAGOGASTRODUODENOSCOPY N/A 01/06/2022   Procedure: ESOPHAGOGASTRODUODENOSCOPY (EGD);  Surgeon: Burnette Fallow, MD;   Location: [facility];  Service: Gastroenterology;  Laterality: N/A;   ESOPHAGOGASTRODUODENOSCOPY (EGD) WITH PROPOFOL  N/A 11/02/2022   Procedure: ESOPHAGOGASTRODUODENOSCOPY (EGD) WITH PROPOFOL ;  Surgeon: Aneita Gwendlyn DASEN, MD;  Location: [facility];  Service: Gastroenterology;  Laterality: N/A;   EXTRACORPOREAL SHOCK WAVE LITHOTRIPSY  11/16/2011   @[facility]    GASTRECTOMY     1970s  for ulcers   HEMORRHOID SURGERY     yrs ago   HOLMIUM LASER APPLICATION N/A 12/01/2021   Procedure: HOLMIUM LASER APPLICATION;  Surgeon: Shack Garnette, MD;  Location: [facility];  Service: Urology;  Laterality: N/A;   INGUINAL HERNIA REPAIR Left 07/13/2016   Procedure: REPAIR LEFT INGUINAL HERNIA;  Surgeon: Krystal Spinner, MD;  Location: [facility];  Service: General;  Laterality: Left;   INSERTION OF MESH Left 07/13/2016   Procedure: INSERTION OF MESH;  Surgeon: Krystal Spinner, MD;  Location: [facility];  Service: General;  Laterality: Left;   MELANOMA EXCISION Left 05/05/2007   @[facility]  ;   LEFT FOREARM   TONSILLECTOMY     child   TOTAL HIP ARTHROPLASTY Left 02/11/2021   Procedure: TOTAL HIP ARTHROPLASTY;  Surgeon: Josefina Chew, MD;  Location: [facility];  Service: Orthopedics;  Laterality: Left;   TOTAL SHOULDER ARTHROPLASTY Right 12/29/2017   Procedure: TOTAL SHOULDER ARTHROPLASTY;  Surgeon: Cristy Bonner DASEN, MD;  Location: [facility];  Service: Orthopedics;  Laterality: Right;    Family History: Family History  Problem Relation Age of Onset   Heart disease Mother        CABG in her 82s   Pneumonia Father    Cancer Sister        breast   Lung cancer Brother        smoker   Colon cancer Neg Hx    Esophageal cancer Neg Hx    Liver cancer Neg Hx    Pancreatic cancer Neg Hx    Rectal cancer Neg Hx     Social History  reports that he quit smoking about 68 years ago. His smoking use included cigarettes. He started smoking about 72 years ago. He has a 0.4 pack-year smoking history. He has never used smokeless  tobacco. He reports that he does not drink alcohol  and does not use drugs.  Allergies  Allergen Reactions   Tape Other (See Comments)    SKIN IS FRAGILE AND WILL TEAR EASILY- PAPER TAPE ONLY   Aspirin  Other (See Comments)    UPSETS THE STOMACH   Percocet [Oxycodone -Acetaminophen ] Nausea And Vomiting   Vitamins [Apatate] Other (See Comments)    Bothers the stomach    Medications   Current Facility-Administered Medications:    apixaban  (ELIQUIS ) tablet 5 mg, 5 mg, Oral, BID, Dorrell, Robert, MD   NOREEN ON 10/25/2024] atorvastatin  (LIPITOR) tablet 20 mg, 20 mg, Oral, Q M,W,F, Dorrell, Robert, MD   escitalopram  (LEXAPRO ) tablet 10 mg, 10 mg, Oral, Daily, Dorrell, Robert, MD   ibuprofen  (ADVIL ) tablet 400 mg, 400 mg, Oral, Q6H PRN, Dorrell, Robert, MD   levothyroxine  (SYNTHROID ) tablet 25 mcg, 25 mcg, Oral, Q0600, Dorrell, Robert, MD   LORazepam  (ATIVAN ) tablet 1 mg, 1 mg, Oral, BID PRN,  Dena Charleston, MD   metoprolol  tartrate (LOPRESSOR ) tablet 12.5 mg, 12.5 mg, Oral, BID, Daniels, Harris K, NP   pantoprazole  (PROTONIX ) EC tablet 40 mg, 40 mg, Oral, BID, Daniels, Shadrack K, NP   thiamine  (VITAMIN B1) 500 mg in sodium chloride  0.9 % 50 mL IVPB, 500 mg, Intravenous, TID, Merrianne Locus, MD  Vitals   Vitals:   November 21, 2024 1832 21-Nov-2024 1915 11-21-2024 1946 21-Nov-2024 2206  BP:   120/76 (!) 104/93  Pulse:  88 86 84  Resp:  17 16 16   Temp: 98.5 F (36.9 C)  98.2 F (36.8 C) 98 F (36.7 C)  TempSrc: Oral  Axillary Axillary  SpO2:  100% 99% 99%    There is no height or weight on file to calculate BMI.   Physical Exam   Constitutional: Appears well-developed and well-nourished. *** Psych: Affect appropriate to situation. *** Eyes: No scleral injection. *** HENT: No OP obstruction. *** Head: Normocephalic. *** Cardiovascular: Normal rate and regular rhythm. *** Respiratory: Effort normal, non-labored breathing. *** GI: Soft.  No distension. There is no tenderness. *** Skin: WDI.  ***  Neurologic Examination   ***  Labs/Imaging/Neurodiagnostic studies   CBC:  Recent Labs  Lab November 21, 2024 1114  WBC 12.4*  NEUTROABS 10.0*  HGB 18.9*  HCT 56.8*  MCV 92.5  PLT 213   Basic Metabolic Panel:  Lab Results  Component Value Date   NA 144 11-21-2024   K 4.1 2024-11-21   CO2 24 11-21-24   GLUCOSE 122 (H) Nov 21, 2024   BUN 36 (H) 21-Nov-2024   CREATININE 1.23 Nov 21, 2024   CALCIUM  9.2 11-21-24   GFRNONAA 57 (L) 11-21-24   GFRAA >60 12/15/2017   Lipid Panel:  Lab Results  Component Value Date   LDLCALC 64 05/09/2024   HgbA1c:  Lab Results  Component Value Date   HGBA1C 6.4 05/09/2024   Urine Drug Screen: No results found for: LABOPIA, COCAINSCRNUR, LABBENZ, AMPHETMU, THCU, LABBARB  Alcohol  Level No results found for: [facility] INR  Lab Results  Component Value Date   INR 1.2 01/04/2022   APTT  Lab Results  Component Value Date   APTT 32 09/03/2012     ASSESSMENT   Joseph Simpson is a 89 y.o. male *** - Exam reveals *** - MRI brain: Abnormal FLAIR hyperintensity involving the medial thalami and midbrain, including the periaqueductal gray matter and tectum. Findings concerning for Wernicke encephalopathy versus other toxic/metabolic etiologies as well as Creutzfeldt-Jakob disease. No definite acute infarct identified. Mild to moderate chronic microvascular ischemic changes and mild generalized parenchymal volume loss. -  -   RECOMMENDATIONS  *** ______________________________________________________________________    Bonney MERRIANNE, Elyn Krogh, MD [facility]

## 2024-10-24 NOTE — Progress Notes (Signed)
 EEG complete - results pending

## 2024-10-24 NOTE — Procedures (Signed)
 Patient Name: CHON BUHL  MRN: [id number]  Epilepsy Attending: Arlin MALVA Krebs  Referring Physician/Provider: Voncile Isles, MD  Date: 10/24/2024  Duration: 23.41 mins  Patient history: 89yo M with ams. EEG to evaluate for seizure  Level of alertness: Awake  AEDs during EEG study: None  Technical aspects: This EEG study was done with scalp electrodes positioned according to the 10-20 International system of electrode placement. Electrical activity was reviewed with band pass filter of 1-70Hz , sensitivity of 7 uV/mm, display speed of 50mm/sec with a 60Hz  notched filter applied as appropriate. EEG data were recorded continuously and digitally stored.  Video monitoring was available and reviewed as appropriate.  Description: EEG showed continuous generalized predominantly 5 to 6 Hz theta slowing admixed with intermittent 2-3hz  delta slowing. Hyperventilation and photic stimulation were not performed.     Study was technically difficult due to significant myogenic artifact  ABNORMALITY - Continuous slow, generalized  IMPRESSION: This technically difficult study is suggestive of generalized cerebral dysfunction (encephalopathy). No seizures or epileptiform discharges were seen throughout the recording.  Keshanna Riso O Nataki Mccrumb

## 2024-10-24 NOTE — Plan of Care (Signed)
 Patient is lethargic and not able to follow commands  Problem: Safety: Goal: Ability to remain free from injury will improve Outcome: Progressing

## 2024-10-24 NOTE — Consult Note (Addendum)
 NEUROLOGY CONSULT NOTE   Patient Name: Joseph Simpson MRN:  [id number] DOB:  Jul 28, 1935 Chief Complaint: AMS Requesting Provider: Elpidio Reyes DEL, MD  History of Present Illness  Joseph Simpson is a 89 y.o. male with hx of dementia, aortic atherosclerosis, Arthritis, Atrial fibrillation (on Eliquis ), B12 deficiency, CAD , Cancer, Depression, Diverticulosis, Dyslipidemia, Esophagitis, Gastroparesis, GERD, Heart murmur, Hiatal hernia, History of adenomatous polyp of colon, kidney stones, melanoma (2008), syncope (2010), Hypertension, Macular degeneration of both eyes, Nocturia associated with benign prostatic hyperplasia, OSA, Polyneuropathy, Pre-diabetes, PVC's and urinary retention, who was BIB EMS from the memory care unit of his ALF for reports of patient not responding to staff appropriately relative to his baseline mental status. MRI was obtained, revealing abnormal FLAIR hyperintensity involving the medial thalami and midbrain, including the periaqueductal gray matter and tectum; findings concerning for Wernicke encephalopathy versus other toxic/metabolic etiologies as well as other etiologies.  Neurology has been consulted for further evaluation  Attending addendum: I was able to get some history from the son who reports that the patient has been demonstrating symptoms of dementia over the past 2 years, forgetting names of people etc. and over the past few weeks has been placed in a facility.  He was still able to ambulate at baseline with a walker but over the last week or so, had been behaving much more differently than his baseline.  He was not communicative, not talking to people and not paying attention to objects and things.  This change was rather abrupt over the last week or so but the dementia has been going on for a couple of years.     ROS  Comprehensive ROS Unable to ascertain due to AMS  Past History   Past Medical History:  Diagnosis Date   Aortic atherosclerosis     Arthritis    Atrial fibrillation (HCC)    B12 deficiency    Bladder calculi    CAD (coronary artery disease)    cardiologist--- dr jordan;   abnormal nuclear stress test 11/ 2013;  s/p cardiac cath 10-21-2012 minimal nonobstructiive CAD w/ normal LVF   Cancer (HCC)    skin cancer   Dementia (HCC)    Depression    Diverticulosis    Dyslipidemia    Esophagitis    Full dentures    Gastroparesis    followed by Massie Beagle PA ( [facility] in [place]) s/p gastrectomy for ulcers in 1970s   GERD (gastroesophageal reflux disease)    Heart murmur    Hiatal hernia    History of adenomatous polyp of colon    History of kidney stones    History of melanoma 2008   s/p left forearn wide local excision 06/ 2008   History of syncope 2010   cardiologist --- dr jordan;  (11-25-2021 per pt no syncope or near syncope since)   Hypertension    Macular degeneration of both eyes    Nocturia associated with benign prostatic hyperplasia    OSA (obstructive sleep apnea)    no cpap   Polyneuropathy    Pre-diabetes    PVC's (premature ventricular contractions) 2010   06/ 2010 per event monitor SR w/ occasional PVCs and 4 beat SVT (in epic)   Urinary retention    Wears glasses     Past Surgical History:  Procedure Laterality Date   APPENDECTOMY     child   BIOPSY  11/02/2022   Procedure: BIOPSY;  Surgeon: Aneita Gwendlyn DASEN, MD;  Location: [facility];  Service: Gastroenterology;;   CARDIAC CATHETERIZATION  10/2012   minimal nonobstructive CAD with normal LV function   CATARACT EXTRACTION W/ INTRAOCULAR LENS IMPLANT Bilateral    2009 approx   CYSTOSCOPY WITH LITHOLAPAXY N/A 07/13/2016   Procedure: CYSTOSCOPY WITH LITHOLAPAXY;  Surgeon: Garnette Shack, MD;  Location: [facility];  Service: Urology;  Laterality: N/A;   CYSTOSCOPY WITH LITHOLAPAXY N/A 12/01/2021   Procedure: CYSTOSCOPY WITH LITHOLAPAXY;  Surgeon: Shack Garnette, MD;  Location: [facility];  Service: Urology;   Laterality: N/A;   CYSTOSCOPY/RETROGRADE/URETEROSCOPY/STONE EXTRACTION WITH BASKET  02/24/2012   Procedure: CYSTOSCOPY/RETROGRADE/URETEROSCOPY/STONE EXTRACTION WITH BASKET;  Surgeon: Garnette Shack, MD;  Location: [facility];  Service: Urology;  Laterality: Bilateral;  1 HOUR   ESOPHAGOGASTRODUODENOSCOPY N/A 01/06/2022   Procedure: ESOPHAGOGASTRODUODENOSCOPY (EGD);  Surgeon: Burnette Fallow, MD;  Location: [facility];  Service: Gastroenterology;  Laterality: N/A;   ESOPHAGOGASTRODUODENOSCOPY (EGD) WITH PROPOFOL  N/A 11/02/2022   Procedure: ESOPHAGOGASTRODUODENOSCOPY (EGD) WITH PROPOFOL ;  Surgeon: Aneita Gwendlyn DASEN, MD;  Location: [facility];  Service: Gastroenterology;  Laterality: N/A;   EXTRACORPOREAL SHOCK WAVE LITHOTRIPSY  11/16/2011   @[facility]    GASTRECTOMY     1970s  for ulcers   HEMORRHOID SURGERY     yrs ago   HOLMIUM LASER APPLICATION N/A 12/01/2021   Procedure: HOLMIUM LASER APPLICATION;  Surgeon: Shack Garnette, MD;  Location: [facility];  Service: Urology;  Laterality: N/A;   INGUINAL HERNIA REPAIR Left 07/13/2016   Procedure: REPAIR LEFT INGUINAL HERNIA;  Surgeon: Krystal Spinner, MD;  Location: [facility];  Service: General;  Laterality: Left;   INSERTION OF MESH Left 07/13/2016   Procedure: INSERTION OF MESH;  Surgeon: Krystal Spinner, MD;  Location: [facility];  Service: General;  Laterality: Left;   MELANOMA EXCISION Left 05/05/2007   @[facility]  ;   LEFT FOREARM   TONSILLECTOMY     child   TOTAL HIP ARTHROPLASTY Left 02/11/2021   Procedure: TOTAL HIP ARTHROPLASTY;  Surgeon: Josefina Chew, MD;  Location: [facility];  Service: Orthopedics;  Laterality: Left;   TOTAL SHOULDER ARTHROPLASTY Right 12/29/2017   Procedure: TOTAL SHOULDER ARTHROPLASTY;  Surgeon: Cristy Bonner DASEN, MD;  Location: [facility];  Service: Orthopedics;  Laterality: Right;    Family History: Family History  Problem Relation Age of Onset   Heart disease Mother        CABG in her 57s   Pneumonia  Father    Cancer Sister        breast   Lung cancer Brother        smoker   Colon cancer Neg Hx    Esophageal cancer Neg Hx    Liver cancer Neg Hx    Pancreatic cancer Neg Hx    Rectal cancer Neg Hx     Social History  reports that he quit smoking about 68 years ago. His smoking use included cigarettes. He started smoking about 72 years ago. He has a 0.4 pack-year smoking history. He has never used smokeless tobacco. He reports that he does not drink alcohol  and does not use drugs.  Allergies  Allergen Reactions   Tape Other (See Comments)    SKIN IS FRAGILE AND WILL TEAR EASILY- PAPER TAPE ONLY   Aspirin  Other (See Comments)    UPSETS THE STOMACH   Percocet [Oxycodone -Acetaminophen ] Nausea And Vomiting   Vitamins [Apatate] Other (See Comments)    Bothers the stomach    Medications   Current Facility-Administered Medications:    apixaban  (ELIQUIS ) tablet  5 mg, 5 mg, Oral, BID, Dorrell, Robert, MD   NOREEN ON 10/25/2024] atorvastatin  (LIPITOR) tablet 20 mg, 20 mg, Oral, Q M,W,F, Dorrell, Robert, MD   escitalopram  (LEXAPRO ) tablet 10 mg, 10 mg, Oral, Daily, Dorrell, Robert, MD   ibuprofen  (ADVIL ) tablet 400 mg, 400 mg, Oral, Q6H PRN, Dorrell, Robert, MD   levofloxacin  (LEVAQUIN ) IVPB 750 mg, 750 mg, Intravenous, Q48H, Dorrell, Robert, MD, Last Rate: 100 mL/hr at 10/24/24 0407, 750 mg at 10/24/24 0407   levothyroxine  (SYNTHROID ) tablet 25 mcg, 25 mcg, Oral, Q0600, Dorrell, Lamar, MD   LORazepam  (ATIVAN ) tablet 1 mg, 1 mg, Oral, BID PRN, Dorrell, Robert, MD   metoprolol  tartrate (LOPRESSOR ) tablet 12.5 mg, 12.5 mg, Oral, BID, Daniels, Thailan K, NP   pantoprazole  (PROTONIX ) EC tablet 40 mg, 40 mg, Oral, BID, Daniels, Saleh K, NP   thiamine  (VITAMIN B1) 500 mg in sodium chloride  0.9 % 50 mL IVPB, 500 mg, Intravenous, TID, Lindzen, Eric, MD, Last Rate: 110 mL/hr at 10/24/24 0851, 500 mg at 10/24/24 0851  Vitals   Vitals:   09-Nov-2024 1946 11-09-24 2206 10/24/24 0233 10/24/24  0530  BP: 120/76 (!) 104/93  113/88  Pulse: 86 84  99  Resp: 16 16  16   Temp: 98.2 F (36.8 C) 98 F (36.7 C)  97.8 F (36.6 C)  TempSrc: Axillary Axillary  Oral  SpO2: 99% 99%  97%  Weight:   68.7 kg     Body mass index is 20.54 kg/m.   Physical Exam   General: He is awake alert sitting in bed, being fed by the nurses aide. HEENT: Normocephalic atraumatic Lungs: Clear Cardiovascular: Regular rate rhythm Neurological exam He is awake alert, initially was not tracking the examiner but responding to the nursing aide who is feeding him.  After some time, he did reach his hand out to say hello and shake my hand and when I asked how he is doing, he said doing well.  He did not respond to other questions He did not follow commands. Initially it looked like he was neglecting the left side but later on it seemed that he was tending to both sides. Cranial nerves: Pupils are equal round react light, extraocular movement difficult to assess since he does not really follow commands although he did track the examiner on both sides I did not really appreciate eye movements in all directions very well to comment on them specifically, blinks to threat from both sides, face appears grossly symmetric-has a very thick mustache but within the constraints of that, face is symmetric. Motor examination: Again due to him not following commands, difficult to assess but he has good right grip strength as witnessed by when he shook my hand, on the left side, he was able to lift a cup and drink water  from it without any difficulty. Sensation: Grossly intact  Labs/Imaging/Neurodiagnostic studies   CBC:  Recent Labs  Lab 11/09/2024 1114  WBC 12.4*  NEUTROABS 10.0*  HGB 18.9*  HCT 56.8*  MCV 92.5  PLT 213   Basic Metabolic Panel:  Lab Results  Component Value Date   NA 144 11/09/24   K 4.1 09-Nov-2024   CO2 24 2024/11/09   GLUCOSE 122 (H) Nov 09, 2024   BUN 36 (H) 11-09-24   CREATININE 1.23  11/09/2024   CALCIUM  9.2 2024-11-09   GFRNONAA 57 (L) 11/09/24   GFRAA >60 12/15/2017   Lipid Panel:  Lab Results  Component Value Date   LDLCALC 64 05/09/2024   HgbA1c:  Lab Results  Component Value Date   HGBA1C 6.4 05/09/2024   INR  Lab Results  Component Value Date   INR 1.2 01/04/2022   APTT  Lab Results  Component Value Date   APTT 32 09/03/2012   CT Head without contrast(Personally reviewed): 1. No acute intracranial abnormality. 2. Mild cerebral volume loss and mild periventricular white matter disease  MRI Brain(Personally reviewed): On my personal review, there is abnormal FLAIR hyperintensity in the medial thalami and midbrain including the periaqueductal gray matter and tectum.  Radiology has put CJD in the differentials as below but based on the clinical course, I feel Wernicke's encephalopathy might be on the top of the differential. Formal radiology read of the MRI brain pasted below: 1. Abnormal FLAIR hyperintensity involving the medial thalami and midbrain, including the periaqueductal gray matter and tectum. Findings concerning for Wernicke encephalopathy versus other toxic/metabolic etiologies as well as Creutzfeldt-Jakob disease. 2.  No definite acute infarct identified. 3. Mild to moderate chronic microvascular ischemic changes and mild generalized parenchymal volume loss.  Labs  WBC 16.9 UA concerning for UTI. Blood cultures negative  Thiamine  pending   ASSESSMENT   BREYLON SHERROW is a 89 y.o. male hx of dementia, aortic atherosclerosis, Arthritis, Atrial fibrillation (on Eliquis ), B12 deficiency, CAD , Cancer, Depression, Diverticulosis, Dyslipidemia, Esophagitis, Gastroparesis, GERD, Heart murmur, Hiatal hernia, History of adenomatous polyp of colon, kidney stones, melanoma (2008), syncope (2010), Hypertension, Macular degeneration of both eyes, Nocturia associated with benign prostatic hyperplasia, OSA, Polyneuropathy, Pre-diabetes, PVC's and  urinary retention, who was BIB EMS from the memory care unit of his ALF for reports of patient not responding to staff appropriately relative to his baseline mental status.  Family reports a rather swift decline over the past week or 2 in someone who has already had now dementia for over a couple of years.  Based on the report and history that obtained from family, this does not appear to be rapidly progressive dementia although these events of last couple of weeks are rather abrupt but he also has a UTI and leukocytosis which might be contributing to him presenting altered.  His MRI findings are more related to a Wernicke's encephalopathy type of picture rather than CJD based on the clinical history and I discussed with the family that at this point my suspicion for CJD is low so I do not recommend spinal tap right away.  RECOMMENDATIONS  - Delirium precautions - Treat UTI per primary team  - Check B12, TSH, RPR - Continue high dose thiamine  for 3 days then 250 for 3 days then 100 mg daily  Will follow.  ______________________________________________________________________    Bonney Karna DELENA Waddell, NP [facility]  Attending Neurohospitalist Addendum Patient seen and examined with APP/Resident. Agree with the history and physical as documented above. Agree with the plan as documented, which I helped formulate. I have independently reviewed the chart, obtained history, review of systems and examined the patient.I have personally reviewed pertinent head/neck/spine imaging (CT/MRI).  Plan was discussed with Dr. Elpidio  Please feel free to call with any questions.  -- Eligio Lav, MD Neurologist [facility] Pager: [phone]

## 2024-10-24 NOTE — Progress Notes (Addendum)
 PROGRESS NOTE  Joseph Simpson  FMW:[id number] DOB: July 09, 1935 DOA: 10/23/2024 PCP: Katrinka Garnette KIDD, MD  Consultants  Brief Narrative: 89 y.o. male with medical history significant of with history of atrial fibrillation, dementia, hyperlipidemia who presented to the Emergency Department due to confusion.  Patient has a chronic Foley catheter and lives in a nursing home.  Was normal yesterday around dinnertime however became nonverbal and confused.  He presents emergency department where he was found to be afebrile hemodynamically stable.  Labs were obtained on presentation which showed WBC 12.4, hemoglobin 18.9, urinalysis concerning for infection.  Patient was started on ceftriaxone .  CT head was obtained which showed no acute findings.  MR head was obtained which showed hyperintensity involving the medial thalamus and midbrain concerning for Warnicke's versus toxic metabolic etiologies.  Patient was admitted for further workup. On evaluation patient was largely nonresponsive. No focal deficits.    Assessment & Plan: Principal Problem:   Encephalopathy acute   # Acute encephalopathy most likely infectious vs Wernickes vs metabolic - Patient's Foley catheter replaced in ED - This PM, much more awake and alert and back to his baseline.  - Concern for Wernicke's based on MRI studies.  Greatly appreciate neuro input - sounds like he's been having dementia symptoms over past 2 years, plus increased confusion over past week.   - Aggressive replacement of thiamine , Wernicke's much more likely than infection.  High dose x 3 days.  Other labs per neuro pending.  - possibility of UTI as well, which could be cause of subacute mental status changes.   # UTI:   - concern for same based on leukocytosis and UA plus confusion.  - that said, he's never not had hematuria, going back to middle of this year - much more awake and alert this PM.   - now on levaquin .  If culture negative, recommend outpt urology  referral.    # A-fib-continue Eliquis  - could be contributing to hematuria above. - continue home beta blocker.     # GERD-continue Protonix    # Cognitive impairment - on PRN ativan  at home - this has been continued, but recommend DC'ing at discharge as could worsen any delirium/dementia   # Hypothyroidism-continue Synthroid    # Depression-continue Lexapro    # Hyperlipidemia-continue Lipitor         Wound 10/23/24 2003 Pressure Injury Sacrum Bilateral;Mid Stage 1 -  Intact skin with non-blanchable redness of a localized area usually over a bony prominence. (Active)   DVT prophylaxis:  SCDs Start: 10/23/24 1752 apixaban  (ELIQUIS ) tablet 5 mg  Code Status:   Code Status: Full Code Level of care: Med-Surg Status is: Inpatient   Consults called: Neurology   Subjective: Patient very hard of hearing but awake and alert.  Reports not being hungry, able to eat about half of his lunch.  No complaints.  Feels better than admission.   Objective: Vitals:   10/23/24 2206 10/24/24 0233 10/24/24 0530 10/24/24 1030  BP: (!) 104/93  113/88 (!) 152/80  Pulse: 84  99 96  Resp: 16  16 17   Temp: 98 F (36.7 C)  97.8 F (36.6 C)   TempSrc: Axillary  Oral   SpO2: 99%  97% 100%  Weight:  68.7 kg      Intake/Output Summary (Last 24 hours) at 10/24/2024 1631 Last data filed at 10/24/2024 0620 Gross per 24 hour  Intake 100 ml  Output 600 ml  Net -500 ml   Filed Weights   10/24/24 0233  Weight: 68.7 kg   Body mass index is 20.54 kg/m.  Gen: 89 y.o. male in no apparent distress.  Nontoxic, appears stated age Pulm: Non-labored breathing.  Clear to auscultation bilaterally ant chest CV: Irr rhythm GI: Abdomen soft, non-tender, non-distended Ext: Warm, no deformitie Skin: No rashes Neuro: Alert and oriented to person and place. No focal neurological deficits.   I have personally reviewed the following labs and images: CBC: Recent Labs  Lab 10/23/24 1114  WBC 12.4*   NEUTROABS 10.0*  HGB 18.9*  HCT 56.8*  MCV 92.5  PLT 213   BMP &GFR Recent Labs  Lab 10/23/24 1114  NA 144  K 4.1  CL 104  CO2 24  GLUCOSE 122*  BUN 36*  CREATININE 1.23  CALCIUM  9.2   Estimated Creatinine Clearance: 41.1 mL/min (by C-G formula based on SCr of 1.23 mg/dL). Liver & Pancreas: Recent Labs  Lab 10/23/24 1114  AST 24  ALT 21  ALKPHOS 86  BILITOT 2.1*  PROT 7.9  ALBUMIN  3.8   No results for input(s): LIPASE, AMYLASE in the last 168 hours. No results for input(s): AMMONIA in the last 168 hours. Diabetic: No results for input(s): HGBA1C in the last 72 hours. Recent Labs  Lab 10/23/24 1158  GLUCAP 116*   Cardiac Enzymes: No results for input(s): CKTOTAL, CKMB, CKMBINDEX, TROPONINI in the last 168 hours. No results for input(s): PROBNP in the last 8760 hours. Coagulation Profile: No results for input(s): INR, PROTIME in the last 168 hours. Thyroid  Function Tests: Recent Labs    10/24/24 1405  TSH 3.035   Lipid Profile: No results for input(s): CHOL, HDL, LDLCALC, TRIG, CHOLHDL, LDLDIRECT in the last 72 hours. Anemia Panel: No results for input(s): VITAMINB12, FOLATE, FERRITIN, TIBC, IRON, RETICCTPCT in the last 72 hours. Urine analysis:    Component Value Date/Time   COLORURINE YELLOW 10/23/2024 1440   APPEARANCEUR TURBID (A) 10/23/2024 1440   LABSPEC 1.018 10/23/2024 1440   PHURINE 5.0 10/23/2024 1440   GLUCOSEU NEGATIVE 10/23/2024 1440   HGBUR LARGE (A) 10/23/2024 1440   BILIRUBINUR NEGATIVE 10/23/2024 1440   KETONESUR 5 (A) 10/23/2024 1440   PROTEINUR 100 (A) 10/23/2024 1440   NITRITE POSITIVE (A) 10/23/2024 1440   LEUKOCYTESUR MODERATE (A) 10/23/2024 1440   Sepsis Labs: Invalid input(s): PROCALCITONIN, LACTICIDVEN  Microbiology: Recent Results (from the past 240 hours)  Culture, blood (single)     Status: None (Preliminary result)   Collection Time: 10/23/24  4:30 PM   Specimen:  BLOOD LEFT ARM  Result Value Ref Range Status   Specimen Description BLOOD LEFT ARM  Final   Special Requests   Final    BOTTLES DRAWN AEROBIC AND ANAEROBIC Blood Culture adequate volume   Culture   Final    NO GROWTH < 24 HOURS Performed at [facility], [address] [place], [place] [address]    Report Status PENDING  Incomplete    Radiology Studies: EEG adult Result Date: 10/24/2024 Shelton Arlin KIDD, MD     10/24/2024  4:04 PM Patient Name: Joseph Simpson MRN: [id number] Epilepsy Attending: Arlin KIDD Shelton Referring Physician/Provider: Voncile Isles, MD Date: 10/24/2024 Duration: 23.41 mins Patient history: 89yo M with ams. EEG to evaluate for seizure Level of alertness: Awake AEDs during EEG study: None Technical aspects: This EEG study was done with scalp electrodes positioned according to the 10-20 International system of electrode placement. Electrical activity was reviewed with band pass filter of 1-70Hz , sensitivity of 7 uV/mm, display  speed of 52mm/sec with a 60Hz  notched filter applied as appropriate. EEG data were recorded continuously and digitally stored.  Video monitoring was available and reviewed as appropriate. Description: EEG showed continuous generalized predominantly 5 to 6 Hz theta slowing admixed with intermittent 2-3hz  delta slowing. Hyperventilation and photic stimulation were not performed.   Study was technically difficult due to significant myogenic artifact ABNORMALITY - Continuous slow, generalized IMPRESSION: This technically difficult study is suggestive of generalized cerebral dysfunction (encephalopathy). No seizures or epileptiform discharges were seen throughout the recording. Arlin MALVA Krebs   MR BRAIN WO CONTRAST Result Date: 10/23/2024 EXAM: MRI BRAIN WITHOUT CONTRAST 10/23/2024 04:34:19 PM TECHNIQUE: Multiplanar multisequence MRI of the head/brain was performed without the administration of intravenous contrast. COMPARISON: None available. CLINICAL  HISTORY: Clinical history of neurologic deficit with concern for stroke. FINDINGS: BRAIN AND VENTRICLES: There is abnormal FLAIR hyperintensity along the medial aspect of both thalami. There is additional FLAIR signal abnormality extending into the midbrain, particularly involving the periaqueductal gray matter and the tectum of the midbrain. There is mild diffusion signal abnormality in this region without evidence of definite restricted diffusion, likely reflecting T2 shine through. No definite acute infarct identified. No intracranial hemorrhage. No mass. No significant mass effect. No midline shift. No hydrocephalus. Scattered areas of T2 and FLAIR hyperintensity suggestive of mild to moderate chronic microvascular ischemic changes. There is mild generalized parenchymal volume loss. The sella is unremarkable. Normal flow voids. ORBITS: Bilateral lens replacement. SINUSES AND MASTOIDS: No acute abnormality. BONES AND SOFT TISSUES: Normal marrow signal. Postsurgical changes in the visualized upper cervical spine. No acute soft tissue abnormality. IMPRESSION: 1. Abnormal FLAIR hyperintensity involving the medial thalami and midbrain, including the periaqueductal gray matter and tectum. Findings concerning for Wernicke encephalopathy versus other toxic/metabolic etiologies as well as Creutzfeldt-Jakob disease. 2.  No definite acute infarct identified. 3. Mild to moderate chronic microvascular ischemic changes and mild generalized parenchymal volume loss. Electronically signed by: Donnice Mania MD 10/23/2024 04:52 PM EST RP Workstation: [id number]    Scheduled Meds:  apixaban   5 mg Oral BID   [START ON 10/25/2024] atorvastatin   20 mg Oral Q M,W,F   escitalopram   10 mg Oral Daily   levothyroxine   25 mcg Oral Q0600   metoprolol  tartrate  12.5 mg Oral BID   pantoprazole   40 mg Oral BID   Continuous Infusions:  levofloxacin  (LEVAQUIN ) IV 750 mg (10/24/24 0407)   thiamine  (VITAMIN B1) injection 500 mg  (10/24/24 1622)     LOS: 1 day   35 minutes with more than 50% spent in reviewing records, counseling patient/family and coordinating care.  Reyes VEAR Gaw, MD [facility] [email or web address] 10/24/2024, 4:31 PM

## 2024-10-25 LAB — CBC
HCT: 53.5 % — ABNORMAL HIGH (ref 39.0–52.0)
Hemoglobin: 17.5 g/dL — ABNORMAL HIGH (ref 13.0–17.0)
MCH: 30.9 pg (ref 26.0–34.0)
MCHC: 32.7 g/dL (ref 30.0–36.0)
MCV: 94.4 fL (ref 80.0–100.0)
Platelets: 145 K/uL — ABNORMAL LOW (ref 150–400)
RBC: 5.67 MIL/uL (ref 4.22–5.81)
RDW: 13.3 % (ref 11.5–15.5)
WBC: 10.4 K/uL (ref 4.0–10.5)
nRBC: 0 % (ref 0.0–0.2)

## 2024-10-25 LAB — COMPREHENSIVE METABOLIC PANEL WITH GFR
ALT: 16 U/L (ref 0–44)
AST: 22 U/L (ref 15–41)
Albumin: 3.3 g/dL — ABNORMAL LOW (ref 3.5–5.0)
Alkaline Phosphatase: 68 U/L (ref 38–126)
Anion gap: 11 (ref 5–15)
BUN: 37 mg/dL — ABNORMAL HIGH (ref 8–23)
CO2: 25 mmol/L (ref 22–32)
Calcium: 9.1 mg/dL (ref 8.9–10.3)
Chloride: 111 mmol/L (ref 98–111)
Creatinine, Ser: 1.34 mg/dL — ABNORMAL HIGH (ref 0.61–1.24)
GFR, Estimated: 51 mL/min — ABNORMAL LOW (ref >60)
Glucose, Bld: 88 mg/dL (ref 70–99)
Potassium: 3.8 mmol/L (ref 3.5–5.1)
Sodium: 147 mmol/L — ABNORMAL HIGH (ref 135–145)
Total Bilirubin: 1.9 mg/dL — ABNORMAL HIGH (ref 0.0–1.2)
Total Protein: 6.7 g/dL (ref 6.5–8.1)

## 2024-10-25 LAB — SYPHILIS: RPR W/REFLEX TO RPR TITER AND TREPONEMAL ANTIBODIES, TRADITIONAL SCREENING AND DIAGNOSIS ALGORITHM: RPR Ser Ql: NONREACTIVE

## 2024-10-25 MED ORDER — THIAMINE HCL 100 MG/ML IJ SOLN
250.0000 mg | Freq: Every day | INTRAVENOUS | Status: DC
  Administered 2024-10-26 – 2024-10-27 (×2): 250 mg via INTRAVENOUS
  Filled 2024-10-25 (×2): qty 2.5

## 2024-10-25 MED ORDER — SODIUM CHLORIDE 0.9 % IV SOLN: INTRAVENOUS | Status: DC

## 2024-10-25 MED ORDER — ENSURE PLUS HIGH PROTEIN PO LIQD
237.0000 mL | Freq: Two times a day (BID) | ORAL | Status: DC
  Administered 2024-10-26 – 2024-10-27 (×4): 237 mL via ORAL

## 2024-10-25 MED ORDER — B COMPLEX-C PO TABS
1.0000 | ORAL_TABLET | Freq: Every day | ORAL | Status: DC
  Administered 2024-10-25 – 2024-10-27 (×3): 1 via ORAL
  Filled 2024-10-25 (×3): qty 1

## 2024-10-25 MED ORDER — LEVOFLOXACIN 750 MG PO TABS
750.0000 mg | ORAL_TABLET | ORAL | Status: DC
  Administered 2024-10-26: 750 mg via ORAL
  Filled 2024-10-25 (×2): qty 1

## 2024-10-25 NOTE — Plan of Care (Signed)

## 2024-10-25 NOTE — Plan of Care (Signed)
   Problem: Education: Goal: Knowledge of General Education information will improve Description: Including pain rating scale, medication(s)/side effects and non-pharmacologic comfort measures Outcome: Progressing   Problem: Nutrition: Goal: Adequate nutrition will be maintained Outcome: Progressing

## 2024-10-25 NOTE — Progress Notes (Addendum)
 PROGRESS NOTE  SUTTER AHLGREN  FMW:[id number] DOB: 12-Jun-1935 DOA: 10/23/2024 PCP: Katrinka Garnette KIDD, MD  Consultants  Brief Narrative: 89 y.o. male with medical history significant of with history of atrial fibrillation, dementia, hyperlipidemia who presented to the Emergency Department due to confusion.  Patient has a chronic Foley catheter and lives in a nursing home.  Was normal yesterday around dinnertime however became nonverbal and confused.  He presents emergency department where he was found to be afebrile hemodynamically stable.  Labs were obtained on presentation which showed WBC 12.4, hemoglobin 18.9, urinalysis concerning for infection.  Patient was started on ceftriaxone .  CT head was obtained which showed no acute findings.  MR head was obtained which showed hyperintensity involving the medial thalamus and midbrain concerning for Warnicke's versus toxic metabolic etiologies.  Patient was admitted for further workup. On evaluation patient was largely unresponsive.  Initial UA concerning for UTI and he was started on antibiotics.  Since that time he has had notable improvement in his condition.  Also started on high-dose thiamine  which has been helpful as well.   Assessment & Plan: Principal Problem:   Encephalopathy acute   # Acute encephalopathy most likely infectious vs Wernickes vs metabolic - Patient's Foley catheter replaced in ED - Greatly appreciate neurology input.  Patient does have some improvement today. -Working diagnosis now most likely UTI contributing to subacute mental status change with +/- Warnicke's encephalopathy.  He is on high-dose thiamine  day #2.  Will switch to 250 mg IV x 5 more days starting tomorrow per typical tx protocol.  Thiamine  level still pending. - Adding other B vitamin complex as well  # UTI, due to foley catheter:   - concern for same based on leukocytosis and UA plus confusion. Urine culture pending - WBC normalized.  - Has history of numerous  prior urine cultures this year positive for Citrobacter, which has been sensitive to FLQ's. Currently on Levaquin  - possibly now colonized with this micro-organism.  - longstanding hx/o hematuria, as well as renal stones.  - much more awake and alert this PM.   - now on levaquin .  Awaiting cultures.  Will switch to oral levaquin  today and watch.    # A-fib -continue Eliquis  - could be contributing to hematuria above. - continue home beta blocker.     # GERD -continue Protonix    # Cognitive impairment - on PRN ativan  at home - this has been continued, but recommend DC'ing at discharge as could worsen any delirium/dementia - issue for past 2 years at least, with subacute worsening, treating as above.    Hypernatremia: - with elevated Hgb as well, most likely dehydration - he was eating and drinking breakfast on my exam - started short-term course of IVF.  Will trend  AKI:  - also likely secondary to dehydration as above. - Continue to trend creatinine - fluids as above.     # Hypothyroidism -continue Synthroid  - TSH normal on admit   # Depression  -continue Lexapro    # Hyperlipidemia -continue Lipitor   Hyperbilirubinemia: - present for years intermittenly going back at least to 2021, persistent since 2023.  - Rest of LFTs look good, will trend.      Wound 10/23/24 2003 Pressure Injury Sacrum Bilateral;Mid Stage 1 -  Intact skin with non-blanchable redness of a localized area usually over a bony prominence. (Active)   DVT prophylaxis:  SCDs Start: 10/23/24 1752 apixaban  (ELIQUIS ) tablet 5 mg  Code Status:   Code Status: Full  Code Level of care: Med-Surg Status is: Inpatient   Consults called: Neurology   Subjective: Patient very hard of hearing but awake and alert.  Reports not being hungry, able to eat about half of his lunch.  No complaints.  Feels better than admission.   Objective: Vitals:   10/24/24 1747 10/24/24 2046 10/25/24 0436 10/25/24 1007  BP:  123/80 138/76 101/71 112/84  Pulse:  96 (!) 58 90  Resp: 17   17  Temp:  (!) 97.5 F (36.4 C) 98 F (36.7 C) 98.1 F (36.7 C)  TempSrc:  Oral  Oral  SpO2: 97% 98% 99% 99%  Weight:        Intake/Output Summary (Last 24 hours) at 10/25/2024 1503 Last data filed at 10/25/2024 1200 Gross per 24 hour  Intake 20 ml  Output 350 ml  Net -330 ml   Filed Weights   10/24/24 0233  Weight: 68.7 kg   Body mass index is 20.54 kg/m.  Gen: 89 y.o. male in no apparent distress.  Nontoxic, appears stated age Pulm: Non-labored breathing.  Clear to auscultation bilaterally ant chest CV: Irr rhythm GI: Abdomen soft, non-tender, non-distended Ext: Warm, no deformitie Skin: No rashes Neuro: Alert and oriented to person and place. No focal neurological deficits.   I have personally reviewed the following labs and images: CBC: Recent Labs  Lab 10/23/24 1114 10/25/24 0136  WBC 12.4* 10.4  NEUTROABS 10.0*  --   HGB 18.9* 17.5*  HCT 56.8* 53.5*  MCV 92.5 94.4  PLT 213 145*   BMP &GFR Recent Labs  Lab 10/23/24 1114 10/25/24 0136  NA 144 147*  K 4.1 3.8  CL 104 111  CO2 24 25  GLUCOSE 122* 88  BUN 36* 37*  CREATININE 1.23 1.34*  CALCIUM  9.2 9.1   Estimated Creatinine Clearance: 37.7 mL/min (A) (by C-G formula based on SCr of 1.34 mg/dL (H)). Liver & Pancreas: Recent Labs  Lab 10/23/24 1114 10/25/24 0136  AST 24 22  ALT 21 16  ALKPHOS 86 68  BILITOT 2.1* 1.9*  PROT 7.9 6.7  ALBUMIN  3.8 3.3*   No results for input(s): LIPASE, AMYLASE in the last 168 hours. No results for input(s): AMMONIA in the last 168 hours. Diabetic: No results for input(s): HGBA1C in the last 72 hours. Recent Labs  Lab 10/23/24 1158  GLUCAP 116*   Cardiac Enzymes: No results for input(s): CKTOTAL, CKMB, CKMBINDEX, TROPONINI in the last 168 hours. No results for input(s): PROBNP in the last 8760 hours. Coagulation Profile: No results for input(s): INR, PROTIME in the  last 168 hours. Thyroid  Function Tests: Recent Labs    10/24/24 1405  TSH 3.035   Lipid Profile: No results for input(s): CHOL, HDL, LDLCALC, TRIG, CHOLHDL, LDLDIRECT in the last 72 hours. Anemia Panel: Recent Labs    10/24/24 1405  VITAMINB12 1,584*   Urine analysis:    Component Value Date/Time   COLORURINE YELLOW 10/23/2024 1440   APPEARANCEUR TURBID (A) 10/23/2024 1440   LABSPEC 1.018 10/23/2024 1440   PHURINE 5.0 10/23/2024 1440   GLUCOSEU NEGATIVE 10/23/2024 1440   HGBUR LARGE (A) 10/23/2024 1440   BILIRUBINUR NEGATIVE 10/23/2024 1440   KETONESUR 5 (A) 10/23/2024 1440   PROTEINUR 100 (A) 10/23/2024 1440   NITRITE POSITIVE (A) 10/23/2024 1440   LEUKOCYTESUR MODERATE (A) 10/23/2024 1440   Sepsis Labs: Invalid input(s): PROCALCITONIN, LACTICIDVEN  Microbiology: Recent Results (from the past 240 hours)  Culture, blood (single)     Status: None (Preliminary result)  Collection Time: 10/23/24  4:30 PM   Specimen: BLOOD LEFT ARM  Result Value Ref Range Status   Specimen Description BLOOD LEFT ARM  Final   Special Requests   Final    BOTTLES DRAWN AEROBIC AND ANAEROBIC Blood Culture adequate volume   Culture   Final    NO GROWTH 2 DAYS Performed at [facility], [address], [place], [place] [address]    Report Status PENDING  Incomplete    Radiology Studies: No results found.   Scheduled Meds:  apixaban   5 mg Oral BID   atorvastatin   20 mg Oral Q M,W,F   Chlorhexidine  Gluconate Cloth  6 each Topical Daily   escitalopram   10 mg Oral Daily   [START ON 10/26/2024] levofloxacin   750 mg Oral Q48H   levothyroxine   25 mcg Oral Q0600   metoprolol  tartrate  12.5 mg Oral BID   pantoprazole   40 mg Oral BID   Continuous Infusions:  thiamine  (VITAMIN B1) injection 500 mg (10/25/24 1007)     LOS: 2 days   35 minutes with more than 50% spent in reviewing records, counseling patient/family and coordinating care.  Reyes VEAR Gaw,  MD [facility] [email or web address] 10/25/2024, 3:03 PM

## 2024-10-25 NOTE — Evaluation (Signed)
 Occupational Therapy Evaluation Patient Details Name: Joseph Simpson MRN: [id number] DOB: 1934/11/28 Today's Date: 10/25/2024   History of Present Illness   Joseph Simpson is a 89 y.o. male with medical history significant of with history of atrial fibrillation, dementia, hyperlipidemia who presented to the Emergency Department due to confusion.  Patient has a chronic Foley catheter and lives in a nursing home.  Was normal yesterday around dinnertime however became nonverbal and confused.  He presents emergency department where he was found to be afebrile hemodynamically.  Labs were obtained on presentation which showed WBC 12.4, hemoglobin 18.9, urinalysis concerning for infection.  Patient was started on ceftriaxone .  CT head was obtained which showed no acute findings.  MR head was obtained which showed hyperintensity involving the medial thalamus and midbrain concerning for Warnicke's versus toxic metabolic etiologies.  Patient was admitted for further workup     Clinical Impressions Difficult to obtain info regarding PLOF for ADLs/selfcare due to being VERY HOH and possibly cognitive processing deficits. PTA pt lives at ALF and used a RW for mobility per PT conversation with pt's son. Pt currently requires mod A to sit EOB with multimodal cues to initiate due to hearing impairment, CGA with grooming/hygiene, min A with UB ADLs with Fair sitting balance, max A with LB ADLs, mod A +2 STS and to use RW for for functional mobility and transfers. Pt limited by impaired balance, strength, endurance and cognition; very pleasant and cooperative. Uncertain of level of assist being provided, if any at ALF. OT will follow acutely to maximize level of function and safety     If plan is discharge home, recommend the following:         Functional Status Assessment   Patient has had a recent decline in their functional status and demonstrates the ability to make significant improvements in function in  a reasonable and predictable amount of time.     Equipment Recommendations   None recommended by OT     Recommendations for Other Services         Precautions/Restrictions   Precautions Precautions: Fall Restrictions Weight Bearing Restrictions Per Provider Order: No     Mobility Bed Mobility Overal bed mobility: Needs Assistance Bed Mobility: Supine to Sit     Supine to sit: Mod assist, HOB elevated, Used rails     General bed mobility comments: verbal, tactile and gestural cues to initiate    Transfers Overall transfer level: Needs assistance Equipment used: Rolling walker (2 wheels) Transfers: Sit to/from Stand, Bed to chair/wheelchair/BSC Sit to Stand: Mod assist, +2 physical assistance     Step pivot transfers: Mod assist, +2 physical assistance     General transfer comment: multimodal cues for hand placement, safety and intiation. Pt with forward LOB walking to chair      Balance Overall balance assessment: Needs assistance Sitting-balance support: Bilateral upper extremity supported, Feet supported Sitting balance-Leahy Scale: Fair     Standing balance support: Bilateral upper extremity supported, During functional activity Standing balance-Leahy Scale: Poor                             ADL either performed or assessed with clinical judgement   ADL Overall ADL's : Needs assistance/impaired Eating/Feeding: Set up;Independent;Sitting   Grooming: Wash/dry hands;Wash/dry face;Contact guard assist;Sitting   Upper Body Bathing: Minimal assistance;Sitting   Lower Body Bathing: Maximal assistance   Upper Body Dressing : Minimal assistance;Sitting   Lower  Body Dressing: Maximal assistance   Toilet Transfer: Moderate assistance;+2 for physical assistance;Rolling walker (2 wheels);Ambulation;Cueing for safety   Toileting- Clothing Manipulation and Hygiene: Maximal assistance;Sit to/from stand         General ADL Comments: limited  by Poor balance, impaired strength, endurance and HOH     Vision Ability to See in Adequate Light: 0 Adequate Additional Comments: unable to properly assess or if he wears glasses or not due to hearing impairment     Perception         Praxis         Pertinent Vitals/Pain Pain Assessment Pain Assessment: No/denies pain     Extremity/Trunk Assessment Upper Extremity Assessment Upper Extremity Assessment: Overall WFL for tasks assessed;Generalized weakness   Lower Extremity Assessment Lower Extremity Assessment: Defer to PT evaluation       Communication Communication Communication: Impaired Factors Affecting Communication: Hearing impaired (VERY HOH)   Cognition Arousal: Alert Behavior During Therapy: Flat affect Cognition: History of cognitive impairments, No family/caregiver present to determine baseline                               Following commands: Intact       Cueing  General Comments   Cueing Techniques: Visual cues;Gestural cues;Tactile cues;Verbal cues      Exercises     Shoulder Instructions      Home Living Family/patient expects to be discharged to:: Assisted living                             Home Equipment: Rolling Walker (2 wheels);Cane - single point;BSC/3in1   Additional Comments: pt lives at ALF per info from pt's son. PLOF and DME info from previous chart as pt VERY HOH and unable to answer all questions      Prior Functioning/Environment               Mobility Comments: uses RW per pt's son ADLs Comments: difficult to obtain info regarding PLOF for ADLs/selfcare due to being VERY HOH and possibly cognitive processing deficits.    OT Problem List: Decreased strength;Decreased knowledge of use of DME or AE;Decreased activity tolerance;Decreased cognition;Impaired balance (sitting and/or standing);Decreased safety awareness   OT Treatment/Interventions: Self-care/ADL training;Therapeutic  exercise;Patient/family education;Balance training;Therapeutic activities;DME and/or AE instruction      OT Goals(Current goals can be found in the care plan section)   Acute Rehab OT Goals Patient Stated Goal: none stated Time For Goal Achievement: 11/08/24 Potential to Achieve Goals: Good ADL Goals Pt Will Perform Grooming: with contact guard assist;standing Pt Will Perform Upper Body Bathing: with contact guard assist;with supervision;sitting Pt Will Perform Lower Body Bathing: with mod assist;with min assist Pt Will Perform Upper Body Dressing: with contact guard assist;with supervision;sitting Pt Will Transfer to Toilet: with mod assist;with min assist;ambulating Pt Will Perform Toileting - Clothing Manipulation and hygiene: with mod assist;with min assist;sitting/lateral leans;sit to/from stand   OT Frequency:  Min 2X/week    Co-evaluation              AM-PAC OT 6 Clicks Daily Activity     Outcome Measure Help from another person eating meals?: None Help from another person taking care of personal grooming?: A Little Help from another person toileting, which includes using toliet, bedpan, or urinal?: A Lot Help from another person bathing (including washing, rinsing, drying)?: A Lot Help from another person  to put on and taking off regular upper body clothing?: A Little Help from another person to put on and taking off regular lower body clothing?: A Lot 6 Click Score: 16   End of Session Equipment Utilized During Treatment: Gait belt;Rolling walker (2 wheels) Nurse Communication: Mobility status  Activity Tolerance: Patient tolerated treatment well Patient left: in chair;with call bell/phone within reach;with chair alarm set  OT Visit Diagnosis: Unsteadiness on feet (R26.81);Other abnormalities of gait and mobility (R26.89);Muscle weakness (generalized) (M62.81)                Time: 8670-8654 OT Time Calculation (min): 16 min Charges:  OT General Charges $OT  Visit: 1 Visit OT Evaluation $OT Eval Moderate Complexity: 1 Mod   Jacques Karna Loose 10/25/2024, 2:23 PM

## 2024-10-25 NOTE — Progress Notes (Signed)
 NEUROLOGY CONSULT FOLLOW UP NOTE   Date of service: October 25, 2024 Patient Name: Joseph Simpson MRN:  [id number] DOB:  13-Nov-1935  Interval Hx/subjective   Sitting up in bed. Disoriented to place, age, time. Follows commands, no focal weakness. Hard of hearing.   Vitals   Vitals:   10/24/24 1030 10/24/24 1747 10/24/24 2046 10/25/24 0436  BP: (!) 152/80 123/80 138/76 101/71  Pulse: 96  96 (!) 58  Resp: 17 17    Temp:   (!) 97.5 F (36.4 C) 98 F (36.7 C)  TempSrc:   Oral   SpO2: 100% 97% 98% 99%  Weight:         Body mass index is 20.54 kg/m.  Physical Exam   Constitutional: Appears elderly, poorly nourished.  Cardiovascular: S1S2 Respiratory: Effort normal, non-labored breathing.   Neurologic Examination   Neuro: Mental Status: Patient is awake, alert. Oriented to self. Disoriented to place, age, month, year. Replies I don't know when asked his age, the current month and year, but able to say December when I asked him what month has Christmas in it.  Follows simple commands with need for repetition often due to extremely hard of hearing.  Cranial Nerves: II: Pupils are equal, round, and reactive to light. Blinks to threat bilaterally.  III,IV, VI: EOMI without ptosis. Tracks examiner fully.  V: Facial sensation is symmetric to light touch. VII: Facial movement is symmetric. (Thick mustache, difficult to see completely) VIII: Hard of hearing X: No dysarthria XI: Head turn/Shoulder shrug is symmetric. XII: tongue is midline without atrophy or fasciculations.  Motor: Tone is normal. Bulk is reduced. Generalized weakness with no focal deficits.  Sensory: Sensation is symmetric to light touch in the arms and legs. Cerebellar: FNF slow, but intact bilaterally  Medications  Current Facility-Administered Medications:    apixaban  (ELIQUIS ) tablet 5 mg, 5 mg, Oral, BID, Dorrell, Robert, MD, 5 mg at 10/24/24 2213   atorvastatin  (LIPITOR) tablet 20 mg, 20 mg, Oral,  Q M,W,F, Dorrell, Robert, MD   Chlorhexidine  Gluconate Cloth 2 % PADS 6 each, 6 each, Topical, Daily, Elpidio Reyes DEL, MD, 6 each at 10/24/24 2217   escitalopram  (LEXAPRO ) tablet 10 mg, 10 mg, Oral, Daily, Dorrell, Robert, MD   ibuprofen  (ADVIL ) tablet 400 mg, 400 mg, Oral, Q6H PRN, Dorrell, Robert, MD   levofloxacin  (LEVAQUIN ) IVPB 750 mg, 750 mg, Intravenous, Q48H, Dorrell, Robert, MD, Last Rate: 100 mL/hr at 10/24/24 0407, 750 mg at 10/24/24 0407   levothyroxine  (SYNTHROID ) tablet 25 mcg, 25 mcg, Oral, Q0600, Dena Charleston, MD, 25 mcg at 10/25/24 0607   LORazepam  (ATIVAN ) tablet 1 mg, 1 mg, Oral, BID PRN, Dorrell, Robert, MD   metoprolol  tartrate (LOPRESSOR ) tablet 12.5 mg, 12.5 mg, Oral, BID, Daniels, Kiwan K, NP, 12.5 mg at 10/24/24 2213   pantoprazole  (PROTONIX ) EC tablet 40 mg, 40 mg, Oral, BID, Daniels, Ronal K, NP, 40 mg at 10/24/24 2213   thiamine  (VITAMIN B1) 500 mg in sodium chloride  0.9 % 50 mL IVPB, 500 mg, Intravenous, TID, Lindzen, Eric, MD, Last Rate: 110 mL/hr at 10/24/24 2217, 500 mg at 10/24/24 2217  Labs and Diagnostic Imaging   CBC:  Recent Labs  Lab 10/23/24 1114 10/25/24 0136  WBC 12.4* 10.4  NEUTROABS 10.0*  --   HGB 18.9* 17.5*  HCT 56.8* 53.5*  MCV 92.5 94.4  PLT 213 145*    Basic Metabolic Panel:  Lab Results  Component Value Date   NA 147 (H) 10/25/2024   K  3.8 10/25/2024   CO2 25 10/25/2024   GLUCOSE 88 10/25/2024   BUN 37 (H) 10/25/2024   CREATININE 1.34 (H) 10/25/2024   CALCIUM  9.1 10/25/2024   GFRNONAA 51 (L) 10/25/2024   GFRAA >60 12/15/2017   Lipid Panel:  Lab Results  Component Value Date   LDLCALC 64 05/09/2024   HgbA1c:  Lab Results  Component Value Date   HGBA1C 6.4 05/09/2024    INR  Lab Results  Component Value Date   INR 1.2 01/04/2022   APTT  Lab Results  Component Value Date   APTT 32 09/03/2012    CT Head without contrast(Personally reviewed): No acute intracranial abnormality. Mild cerebral volume loss  and mild periventricular white matter disease   MRI Brain(Personally reviewed):  Neurology review: abnormal FLAIR hyperintensity in the medial thalami and midbrain including the periaqueductal gray matter and tectum.  Radiology has put CJD in the differentials as below but based on the clinical course, I feel Wernicke's encephalopathy might be on the top of the differential. Formal radiology read of the MRI brain pasted below: Abnormal FLAIR hyperintensity involving the medial thalami and midbrain,including the periaqueductal gray matter and tectum.  Findings concerning for Wernicke encephalopathy versus other toxic/metabolic etiologies as well as Creutzfeldt-Jakob disease. No definite acute infarct identified. Mild to moderate chronic microvascular ischemic changes and mild generalized parenchymal volume loss.   Labs  WBC 16.9 UA concerning for UTI. Culture pending Blood cultures negative  B12 high, 1584 TSH WNL Thiamine , RPR pending   Assessment   Joseph Simpson is a 89 y.o. male  with hx of dementia, aortic atherosclerosis, Arthritis, Atrial fibrillation (on Eliquis ), B12 deficiency, CAD , Cancer, Depression, History of adenomatous polyp of colon, kidney stones, melanoma (2008), syncope (2010), Hypertension, Macular degeneration, Nocturia associated with benign prostatic hyperplasia, OSA, Polyneuropathy, Pre-diabetes, PVC's and urinary retention, who was BIB EMS from ALF for reports of patient not responding to staff appropriately relative to his baseline mental status.   Family reports a rather swift decline over the past week or 2, known dementia for over a couple of years. Does not appear to be rapidly progressive dementia, although these events of last couple of weeks are rather abrupt.  Positive for UTI and leukocytosis which might be contributing to him presenting altered.  His MRI findings are more related to a Wernicke's encephalopathy type of picture rather than CJD, suspicion for  CJD is low do not recommend spinal tap right away.   With exam today showing some improvement, likely that UTI has contributed to mental status change and with treatment, he is now improving. Pending Thiamine  level, suspect this will come back low and the thiamine  supplementation is helping with his overall symptoms as well. MRI shows evidence of Wernicke's encephalopathy, patient appears malnourished. Staff here has been assisting patient with eating at each meal. Not sure if he is receiving this assistance at his current ALF.  Dx: AMS secondary to Wernicke's Encephalopathy and UTI in patient with known dementia  Recommendations   - Continue Delirium Precautions - Continue UTI treatment per primary - Continue high dose thiamine  for 3 days then 250 for 3 days then 100 mg daily - Outpatient neurology follow up for neuropsych testing in 8-12 weeks ______________________________________________________________________   Signed, Rocky JAYSON Likes, NP [facility]   Attending Neurohospitalist Addendum Patient seen and examined with APP/Resident. Agree with the history and physical as documented above. Agree with the plan as documented, which I helped formulate. I have independently reviewed  the chart, obtained history, review of systems and examined the patient.I have personally reviewed pertinent head/neck/spine imaging (CT/MRI). Plan d/w Dr Elpidio Please feel free to call with any questions.  -- Eligio Lav, MD Neurologist [facility] Pager: [phone]

## 2024-10-25 NOTE — Evaluation (Signed)
 Speech Language Pathology Evaluation Patient Details Name: Joseph Simpson MRN: [id number] DOB: 10/29/1935 Today's Date: 10/25/2024 Time: 8545-8487 SLP Time Calculation (min) (ACUTE ONLY): 18 min  Problem List:  Patient Active Problem List   Diagnosis Date Noted   Encephalopathy acute 10/23/2024   Anxiety 05/17/2023   Delirium 11/06/2022   Altered mental status 11/05/2022   Abdominal pain, epigastric 11/02/2022   Intractable nausea and vomiting 10/30/2022   Gastritis and duodenitis 10/29/2022   GI bleed 01/07/2022   Left wrist pain 10/01/2021   Bilateral foot pain 07/04/2021   S/P hip replacement, left 02/11/2021   Hyperglycemia 12/21/2019   B12 deficiency 03/09/2019   Osteoarthritis of left hip 01/30/2019   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 12/29/2017   Paroxysmal atrial fibrillation (HCC)    Gastroparesis 02/15/2017   Hx of adenomatous colonic polyps 02/15/2017   Left inguinal hernia 07/12/2016   Hyperlipidemia 06/10/2016   PAC (premature atrial contraction) 10/25/2015   Squamous cell carcinoma in situ 09/09/2015   BPH (benign prostatic hyperplasia) 11/23/2014   Palpitations 07/31/2014   Cough 05/10/2014   Pulmonary nodule 05/10/2014   Dyspnea 02/27/2014   CAD (coronary artery disease)    Murmur 02/10/2012   Macular degeneration 11/17/2011   Essential hypertension 09/23/2007   GERD 09/23/2007   Past Medical History:  Past Medical History:  Diagnosis Date   Aortic atherosclerosis    Arthritis    Atrial fibrillation (HCC)    B12 deficiency    Bladder calculi    CAD (coronary artery disease)    cardiologist--- dr jordan;   abnormal nuclear stress test 11/ 2013;  s/p cardiac cath 10-21-2012 minimal nonobstructiive CAD w/ normal LVF   Cancer (HCC)    skin cancer   Dementia (HCC)    Depression    Diverticulosis    Dyslipidemia    Esophagitis    Full dentures    Gastroparesis    followed by Joseph Beagle PA ( [facility] in [place]) s/p  gastrectomy for ulcers in 1970s   GERD (gastroesophageal reflux disease)    Heart murmur    Hiatal hernia    History of adenomatous polyp of colon    History of kidney stones    History of melanoma 2008   s/p left forearn wide local excision 06/ 2008   History of syncope 2010   cardiologist --- dr jordan;  (11-25-2021 per pt no syncope or near syncope since)   Hypertension    Macular degeneration of both eyes    Nocturia associated with benign prostatic hyperplasia    OSA (obstructive sleep apnea)    no cpap   Polyneuropathy    Pre-diabetes    PVC's (premature ventricular contractions) 2010   06/ 2010 per event monitor SR w/ occasional PVCs and 4 beat SVT (in epic)   Urinary retention    Wears glasses    Past Surgical History:  Past Surgical History:  Procedure Laterality Date   APPENDECTOMY     child   BIOPSY  11/02/2022   Procedure: BIOPSY;  Surgeon: Joseph Gwendlyn DASEN, MD;  Location: [facility];  Service: Gastroenterology;;   CARDIAC CATHETERIZATION  10/2012   minimal nonobstructive CAD with normal LV function   CATARACT EXTRACTION W/ INTRAOCULAR LENS IMPLANT Bilateral    2009 approx   CYSTOSCOPY WITH LITHOLAPAXY N/A 07/13/2016   Procedure: CYSTOSCOPY WITH LITHOLAPAXY;  Surgeon: Joseph Shack, MD;  Location: [facility];  Service: Urology;  Laterality: N/A;   CYSTOSCOPY WITH LITHOLAPAXY N/A 12/01/2021  Procedure: CYSTOSCOPY WITH LITHOLAPAXY;  Surgeon: Joseph Senior, MD;  Location: [facility];  Service: Urology;  Laterality: N/A;   CYSTOSCOPY/RETROGRADE/URETEROSCOPY/STONE EXTRACTION WITH BASKET  02/24/2012   Procedure: CYSTOSCOPY/RETROGRADE/URETEROSCOPY/STONE EXTRACTION WITH BASKET;  Surgeon: Simpson Matilda, MD;  Location[facility];  Service: Urology;  Laterality: Bilateral;  1 HOUR   ESOPHAGOGASTRODUODENOSCOPY N/A 01/06/2022   Procedure: ESOPHAGOGASTRODUODENOSCOPY (EGD);  Surgeon: Joseph Fallow, MD;  Location: [facility];   Service: Gastroenterology;  Laterality: N/A;   ESOPHAGOGASTRODUODENOSCOPY (EGD) WITH PROPOFOL  N/A 11/02/2022   Procedure: ESOPHAGOGASTRODUODENOSCOPY (EGD) WITH PROPOFOL ;  Surgeon: Joseph Gwendlyn DASEN, MD;  Location: [facility];  Service: Gastroenterology;  Laterality: N/A;   EXTRACORPOREAL SHOCK WAVE LITHOTRIPSY  11/16/2011   @[facility]    GASTRECTOMY     1970s  for ulcers   HEMORRHOID SURGERY     yrs ago   HOLMIUM LASER APPLICATION N/A 12/01/2021   Procedure: HOLMIUM LASER APPLICATION;  Surgeon: Joseph Senior, MD;  Location: [facility];  Service: Urology;  Laterality: N/A;   INGUINAL HERNIA REPAIR Left 07/13/2016   Procedure: REPAIR LEFT INGUINAL HERNIA;  Surgeon: Joseph Spinner, MD;  Location: [facility];  Service: General;  Laterality: Left;   INSERTION OF MESH Left 07/13/2016   Procedure: INSERTION OF MESH;  Surgeon: Joseph Spinner, MD;  Location: [facility];  Service: General;  Laterality: Left;   MELANOMA EXCISION Left 05/05/2007   @[facility]  ;   LEFT FOREARM   TONSILLECTOMY     child   TOTAL HIP ARTHROPLASTY Left 02/11/2021   Procedure: TOTAL HIP ARTHROPLASTY;  Surgeon: Joseph Chew, MD;  Location: [facility];  Service: Orthopedics;  Laterality: Left;   TOTAL SHOULDER ARTHROPLASTY Right 12/29/2017   Procedure: TOTAL SHOULDER ARTHROPLASTY;  Surgeon: Joseph Bonner DASEN, MD;  Location: [facility];  Service: Orthopedics;  Laterality: Right;   HPI:  89 y.o. male who presented to the hospital on 10/23/24 from ALF (memory care) due to confusion. Per patient's son (SLP contacted via phone call), patient is [name] but he kept removing them so they no longer attempt to put them in; patient was living with son Joseph Simpson) for past two years but they moved him into a memory care ALF 4 weeks ago do to decline. In ED, CTH negative for acute findings, MRI brain was concerning for Wernicke encephalopathy versus other toxic/metabolic etiologies as well as  Creutzfeldt-Jakob disease; No definite acute infarct identified. UA  concerning for UTI and he was started on antibiotics. He was admitted for acute encephalopathy (infectious vs Wernickes vs metabolic). PMH: dementia, HLD, a-fib.   Assessment / Plan / Recommendation Clinical Impression  Per this evaluation and phone call with patient's son Lyndy afterwards, patient's cognitive-linguistic function may be mildly declined from baseline but per son, he has been going downhill the last two years. Patient is extremely hard of hearing and in order to communicate with him, SLP spoke loudly and wrote down questions that he could read. This helped but patient did have errors in reading such as confusing the word where for who. Patient was only oriented to self but when SLP did tell him where he was, he asked appropriate questions, Will I have to stay here overnight? and What am I doing at the hospital? Patient was unable to open a bag that had a cookie in it despite him trying and per his son, he had been having increasing difficulty with feeding himself lately. SLP will s/o at this time but recommending consider skilled SLP services at next venue of  care to further assess his basic cognitive function.    SLP Assessment  SLP Recommendation/Assessment: All further Speech Language Pathology needs can be addressed in the next venue of care SLP Visit Diagnosis: Cognitive communication deficit (R41.841)     Assistance Recommended at Discharge  Frequent or constant Supervision/Assistance  Functional Status Assessment Patient has had a recent decline in their functional status and/or demonstrates limited ability to make significant improvements in function in a reasonable and predictable amount of time  Frequency and Duration           SLP Evaluation Cognition  Overall Cognitive Status: Difficult to assess Orientation Level: Oriented to person;Disoriented to time;Disoriented to situation;Disoriented to place Memory: Impaired Memory Impairment: Decreased recall of  new information;Decreased short term memory Decreased Short Term Memory: Verbal basic;Functional basic Awareness: Impaired Awareness Impairment: Intellectual impairment Problem Solving: Impaired Problem Solving Impairment: Verbal basic;Functional basic       Comprehension  Auditory Comprehension Overall Auditory Comprehension: Other (comment) (difficult to assess due to significant HOH)    Expression Expression Primary Mode of Expression: Verbal Verbal Expression Overall Verbal Expression: Impaired at baseline Initiation: Impaired   Oral / Motor  Oral Motor/Sensory Function Overall Oral Motor/Sensory Function: Within functional limits Motor Speech Overall Motor Speech: Appears within functional limits for tasks assessed Articulation: Within functional limitis Intelligibility: Intelligible            Norleen IVAR Blase, MA, CCC-SLP Speech Therapy  10/25/2024, 4:19 PM

## 2024-10-25 NOTE — Evaluation (Signed)
 Physical Therapy Evaluation Patient Details Name: Joseph Simpson MRN: [id number] DOB: Sep 18, 1935 Today's Date: 10/25/2024  History of Present Illness  Joseph Simpson is a 89 y.o. male with medical history significant of with history of atrial fibrillation, dementia, hyperlipidemia who presented to the Emergency Department due to confusion.  Patient has a chronic Foley catheter and lives in a nursing home.  Was normal yesterday around dinnertime however became nonverbal and confused.  He presents emergency department where he was found to be afebrile hemodynamically.  Labs were obtained on presentation which showed WBC 12.4, hemoglobin 18.9, urinalysis concerning for infection.  Patient was started on ceftriaxone .  CT head was obtained which showed no acute findings.  MR head was obtained which showed hyperintensity involving the medial thalamus and midbrain concerning for Warnicke's versus toxic metabolic etiologies.  Patient was admitted for further workup  Clinical Impression   Admitted with above diagnosis; patient lives at an assistant living facility, memory care unit; has been there for about 4 weeks; per his son, he was walking short distances with the rolling walker, but it sounds like he has not been walking much since at his ALF; he presents to PT with increased fall risk, generalized weakness, decreased coordination, and an overall decline in physical function compared to his baseline; needed moderate assist to come to edge of bed, heavy moderate assist to stand from edge of bed, and two person moderate assist to step pivot transfer with rolling walker from bed to recliner, which was about 4 feet away; he seemed quite fearful of falling; despite his significant hearing impairment, he did participate well in the session; PT recommends post-acute rehab to maximize independence and safety with mobility and activities of daily living <3 hours/day; patient will benefit from acute PT services while in  patient two continue to address mobility deficits, and facilitate discharge to the optimal disposition        If plan is discharge home, recommend the following: A lot of help with walking and/or transfers;A lot of help with bathing/dressing/bathroom   Can travel by private vehicle   No    Equipment Recommendations Other (comment) (To be determined)  Recommendations for Other Services       Functional Status Assessment Patient has had a recent decline in their functional status and/or demonstrates limited ability to make significant improvements in function in a reasonable and predictable amount of time     Precautions / Restrictions Precautions Precautions: Fall Restrictions Weight Bearing Restrictions Per Provider Order: No      Mobility  Bed Mobility Overal bed mobility: Needs Assistance Bed Mobility: Supine to Sit     Supine to sit: Mod assist, HOB elevated, Used rails     General bed mobility comments: verbal, tactile and gestural cues to initiate    Transfers Overall transfer level: Needs assistance Equipment used: Rolling walker (2 wheels) Transfers: Sit to/from Stand, Bed to chair/wheelchair/BSC Sit to Stand: Mod assist, +2 physical assistance   Step pivot transfers: Mod assist, +2 physical assistance       General transfer comment: multimodal cues for hand placement, safety and intiation. Pt with forward LOB x2walking to chair    Ambulation/Gait               General Gait Details: Tending to lean very far forward over RW; fearful of falling  Stairs            Wheelchair Mobility     Tilt Bed    Modified Rankin (  Stroke Patients Only)       Balance     Sitting balance-Leahy Scale: Fair       Standing balance-Leahy Scale: Poor                               Pertinent Vitals/Pain Pain Assessment Pain Assessment: No/denies pain    Home Living Family/patient expects to be discharged to:: Assisted living                  Home Equipment: Agricultural [employer]ant (2 wheels);Cane - single point;BSC/3in1 Additional Comments: pt lives at ALF per info from pt's son. PLOF and DME info from previous chart as pt VERY HOH and unable to answer all questions    Prior Function               Mobility Comments: uses RW per pt's son ADLs Comments: difficult to obtain info regarding PLOF for ADLs/selfcare due to being VERY HOH and possibly cognitive processing deficits.     Extremity/Trunk Assessment   Upper Extremity Assessment Upper Extremity Assessment: Defer to OT evaluation    Lower Extremity Assessment Lower Extremity Assessment: Generalized weakness       Communication   Communication Communication: Impaired Factors Affecting Communication: Hearing impaired (VERY HOH)    Cognition Arousal: Alert Behavior During Therapy: Flat affect                             Following commands: Intact       Cueing Cueing Techniques: Visual cues, Gestural cues, Tactile cues, Verbal cues     General Comments      Exercises     Assessment/Plan    PT Assessment Patient needs continued PT services  PT Problem List Decreased strength;Decreased range of motion;Decreased activity tolerance;Decreased balance;Decreased mobility;Decreased coordination;Decreased cognition;Decreased knowledge of use of DME;Decreased safety awareness;Decreased knowledge of precautions       PT Treatment Interventions DME instruction;Gait training;Functional mobility training;Therapeutic activities;Therapeutic exercise;Balance training;Neuromuscular re-education;Cognitive remediation;Patient/family education;Wheelchair mobility training;Manual techniques;Modalities    PT Goals (Current goals can be found in the Care Plan section)  Acute Rehab PT Goals Patient Stated Goal: Agrees to get up to finish lunch PT Goal Formulation: Patient unable to participate in goal setting Time For Goal Achievement:  11/08/24 Potential to Achieve Goals: Good    Frequency Min 2X/week     Co-evaluation               AM-PAC PT 6 Clicks Mobility  Outcome Measure Help needed turning from your back to your side while in a flat bed without using bedrails?: A Little Help needed moving from lying on your back to sitting on the side of a flat bed without using bedrails?: A Lot Help needed moving to and from a bed to a chair (including a wheelchair)?: A Lot Help needed standing up from a chair using your arms (e.g., wheelchair or bedside chair)?: A Lot Help needed to walk in hospital room?: A Lot Help needed climbing 3-5 steps with a railing? : Total 6 Click Score: 12    End of Session Equipment Utilized During Treatment: Gait belt Activity Tolerance: Patient tolerated treatment well Patient left: in chair;with call bell/phone within reach;with chair alarm set Nurse Communication: Mobility status PT Visit Diagnosis: Unsteadiness on feet (R26.81);Other abnormalities of gait and mobility (R26.89);Muscle weakness (generalized) (M62.81)    Time: 8673-8655 PT Time Calculation (min) (  ACUTE ONLY): 18 min   Charges:   PT Evaluation $PT Eval Moderate Complexity: 1 Mod   PT General Charges $$ ACUTE PT VISIT: 1 Visit         Silvano Currier, PT  Acute Rehabilitation Services Office [phone] Secure Chat welcomed   Silvano VEAR Currier 10/25/2024, 3:02 PM

## 2024-10-26 LAB — CBC
HCT: 52.1 % — ABNORMAL HIGH (ref 39.0–52.0)
Hemoglobin: 17.2 g/dL — ABNORMAL HIGH (ref 13.0–17.0)
MCH: 31 pg (ref 26.0–34.0)
MCHC: 33 g/dL (ref 30.0–36.0)
MCV: 94 fL (ref 80.0–100.0)
Platelets: 143 K/uL — ABNORMAL LOW (ref 150–400)
RBC: 5.54 MIL/uL (ref 4.22–5.81)
RDW: 13.3 % (ref 11.5–15.5)
WBC: 13.5 K/uL — ABNORMAL HIGH (ref 4.0–10.5)
nRBC: 0 % (ref 0.0–0.2)

## 2024-10-26 LAB — BASIC METABOLIC PANEL WITH GFR
Anion gap: 9 (ref 5–15)
BUN: 27 mg/dL — ABNORMAL HIGH (ref 8–23)
CO2: 25 mmol/L (ref 22–32)
Calcium: 8.9 mg/dL (ref 8.9–10.3)
Chloride: 112 mmol/L — ABNORMAL HIGH (ref 98–111)
Creatinine, Ser: 1.09 mg/dL (ref 0.61–1.24)
GFR, Estimated: >60 mL/min (ref >60)
Glucose, Bld: 93 mg/dL (ref 70–99)
Potassium: 4.1 mmol/L (ref 3.5–5.1)
Sodium: 146 mmol/L — ABNORMAL HIGH (ref 135–145)

## 2024-10-26 MED ORDER — DEXTROSE-SODIUM CHLORIDE 5-0.9 % IV SOLN: INTRAVENOUS | Status: AC

## 2024-10-26 NOTE — TOC Initial Note (Addendum)
 Transition of Care [facility]) - Initial/Assessment Note    Patient Details  Name: Joseph Simpson MRN: [id number] Date of Birth: [birth year]  Transition of Care [facility] CM/SW Contact:    Jeoffrey LITTIE Maranda ISRAEL Phone Number: 10/26/2024, 10:21 AM  Clinical Narrative:                 Pt admitted from [facility] memory care due to not responding to staff outside of baseline mental status. Pt currently Ox[facility]- CSW completed SNF workup with pt son, Sayvion, over the phone. Rivers is in agreement with SNF following hospital DC and stated [facility] would be his first choice. CSW completed Fl2 and sent out SNF referrals. CSW will continue to follow.  Expected Discharge Plan: Skilled Nursing Facility Barriers to Discharge: Continued Medical Work up, SNF Pending bed offer   Patient Goals and CMS Choice Patient states their goals for this hospitalization and ongoing recovery are:: SNF          Expected Discharge Plan and Services       Living arrangements for the past 2 months: Assisted Living Facility                                      Prior Living Arrangements/Services Living arrangements for the past 2 months: Assisted Living Facility Lives with:: Facility Resident Patient language and need for interpreter reviewed:: Yes Do you feel safe going back to the place where you live?: Yes      Need for Family Participation in Patient Care: Yes (Comment) Care giver support system in place?: Yes (comment)   Criminal Activity/Legal Involvement Pertinent to Current Situation/Hospitalization: No - Comment as needed  Activities of Daily Living   ADL Screening (condition at time of admission) Independently performs ADLs?: No Does the patient have a NEW difficulty with bathing/dressing/toileting/self-feeding that is expected to last >3 days?: Yes (Initiates electronic notice to provider for possible OT consult) Does the patient have a NEW difficulty with getting in/out of bed, walking, or  climbing stairs that is expected to last >3 days?: Yes (Initiates electronic notice to provider for possible PT consult) Does the patient have a NEW difficulty with communication that is expected to last >3 days?: Yes (Initiates electronic notice to provider for possible SLP consult)  Permission Sought/Granted Permission sought to share information with : Facility Medical Sales Representative, Family Supports    Share Information with NAME: Jaimie  Permission granted to share info w AGENCY: SNFs  Permission granted to share info w Relationship: Son  Permission granted to share info w Contact Information: [phone]  Emotional Assessment Appearance:: Appears stated age Attitude/Demeanor/Rapport: Unable to Assess Affect (typically observed): Unable to Assess Orientation: : Oriented to Self Alcohol  / Substance Use: Not Applicable Psych Involvement: No (comment)  Admission diagnosis:  Encephalopathy acute [G93.40] Cystitis [N30.90] Altered mental status, unspecified altered mental status type [R41.82] Patient Active Problem List   Diagnosis Date Noted   Encephalopathy acute 10/23/2024   Anxiety 05/17/2023   Delirium 11/06/2022   Altered mental status 11/05/2022   Abdominal pain, epigastric 11/02/2022   Intractable nausea and vomiting 10/30/2022   Gastritis and duodenitis 10/29/2022   GI bleed 01/07/2022   Left wrist pain 10/01/2021   Bilateral foot pain 07/04/2021   S/P hip replacement, left 02/11/2021   Hyperglycemia 12/21/2019   B12 deficiency 03/09/2019   Osteoarthritis of left hip 01/30/2019   Localized primary osteoarthritis of right  shoulder region 12/29/2017   Paroxysmal atrial fibrillation (HCC)    Gastroparesis 02/15/2017   Hx of adenomatous colonic polyps 02/15/2017   Left inguinal hernia 07/12/2016   Hyperlipidemia 06/10/2016   PAC (premature atrial contraction) 10/25/2015   Squamous cell carcinoma in situ 09/09/2015   BPH (benign prostatic hyperplasia) 11/23/2014    Palpitations 07/31/2014   Cough 05/10/2014   Pulmonary nodule 05/10/2014   Dyspnea 02/27/2014   CAD (coronary artery disease)    Murmur 02/10/2012   Macular degeneration 11/17/2011   Essential hypertension 09/23/2007   GERD 09/23/2007   PCP:  Katrinka Garnette KIDD, MD Pharmacy:   [facility] [phone] [place], [place] - [address] [place] [address] Phone: [phone] Fax: [phone]  [facility] - [place], [place] - [place] [address] [place] [address] Phone: [phone] Fax: [phone]     Social Drivers of Health (SDOH) Social History: SDOH Screenings   Food Insecurity: No Food Insecurity (10/23/2024)  Housing: Low Risk (10/23/2024)  Transportation Needs: No Transportation Needs (10/23/2024)  Utilities: Not At Risk (10/23/2024)  Depression (PHQ2-9): Low Risk (05/09/2024)  Financial Resource Strain: Low Risk (09/17/2022)  Physical Activity: Sufficiently Active (09/17/2022)  Social Connections: Moderately Isolated (10/23/2024)  Stress: No Stress Concern Present (09/17/2022)  Tobacco Use: Medium Risk (10/23/2024)   SDOH Interventions:     Readmission Risk Interventions     No data to display

## 2024-10-26 NOTE — Care Management Important Message (Signed)
 Important Message  Patient Details  Name: Joseph Simpson MRN: [id number] Date of Birth: August 03, 1935   Important Message Given:  Yes - Medicare IM     Jennie Laneta Dragon 10/26/2024, 1:14 PM

## 2024-10-26 NOTE — Progress Notes (Signed)
 [facility]                                                                               Joseph Simpson, is a 89 y.o. male, DOB - 12/03/1934, FMW:[id number] Admit date - 10/23/2024    Outpatient Primary MD for the patient is Joseph Garnette KIDD, MD  LOS - 3  days    Brief summary   89 y.o. male with medical history significant of with history of atrial fibrillation, dementia, hyperlipidemia who presented to the Emergency Department due to confusion.  Patient has a chronic Foley catheter and lives in a nursing home.  Was normal yesterday around dinnertime however became nonverbal and confused.  He presents emergency department where he was found to be afebrile hemodynamically stable.  Labs were obtained on presentation which showed WBC 12.4, hemoglobin 18.9, urinalysis concerning for infection.  Patient was started on ceftriaxone .  CT head was obtained which showed no acute findings.  MR head was obtained which showed hyperintensity involving the medial thalamus and midbrain concerning for Warnicke's versus toxic metabolic etiologies.  Patient was admitted for further workup. On evaluation patient was largely unresponsive.  Initial UA concerning for UTI and he was started on antibiotics.  Since that time he has had notable improvement in his condition.  Also started on high-dose thiamine  which has been helpful as well.     Assessment & Plan    Assessment and Plan:   Acute encephalopathy probably secondary to Wernicke's versus metabolic encephalopathy Probably secondary to urinary tract infection. Patient was started on IV antibiotics along with high-dose thiamine .  Neurology consulted and recommendations given.    Urinary tract infection secondary to chronic Foley catheter Urine cultures growing Citrobacter sensitive to flouroquinolones Continue with Levaquin  to complete the course.   Atrial fibrillation Rate controlled and on Eliquis  for  anticoagulation.    GERD Stable   Hypernatremia Probably secondary to free water  deficit   Acute kidney injury Creatinine has improved with IV fluids, continue to monitor.  Hypothyroidism Continue with Synthroid .   Depression Continue with Lexapro .    Hyperbilirubinemia Chronic elevation of bilirubin around 2.    Hyperlipidemia Continue with Lipitor.    Cognitive impairment Recommend outpatient follow-up with neurology for evaluation of dementia.     RN Pressure Injury Documentation: Wound 10/23/24 2003 Pressure Injury Sacrum Bilateral;Mid Stage 1 -  Intact skin with non-blanchable redness of a localized area usually over a bony prominence. (Active)  Present on admission       Estimated body mass index is 20.54 kg/m as calculated from the following:   Height as of 10/09/24: 6' (1.829 m).   Weight as of this encounter: 68.7 kg.  Code Status: full code.  DVT Prophylaxis:  SCDs Start: 10/23/24 1752 apixaban  (ELIQUIS ) tablet 5 mg   Level of Care: Level of care: Med-Surg Family Communication: discussed with family at bedside  Disposition Plan:     Remains inpatient appropriate:  pending.   Procedures:  None.   Consultants:   Neurology.   Antimicrobials:   Anti-infectives (From admission, onward)    Start     Dose/Rate Route Frequency Ordered Stop   10/26/24 0800  levofloxacin  (LEVAQUIN ) tablet 750 mg        750 mg Oral Every 48 hours 10/25/24 1436 10/30/24 0759   10/24/24 1600  cefTRIAXone  (ROCEPHIN ) 2 g in sodium chloride  0.9 % 100 mL IVPB  Status:  Discontinued        2 g 200 mL/hr over 30 Minutes Intravenous Every 24 hours 10/24/24 0111 10/24/24 0138   10/24/24 0315  sulfamethoxazole -trimethoprim  (BACTRIM ) 160 mg of trimethoprim  in dextrose  5 % 250 mL IVPB  Status:  Discontinued        160 mg of trimethoprim  260 mL/hr over 60 Minutes Intravenous Every 12 hours 10/24/24 0215 10/24/24 0216   10/24/24 0315  levofloxacin  (LEVAQUIN ) IVPB 750 mg   Status:  Discontinued        750 mg 100 mL/hr over 90 Minutes Intravenous Every 48 hours 10/24/24 0223 10/25/24 1436   10/24/24 0230  sulfamethoxazole -trimethoprim  (BACTRIM  DS) 800-160 MG per tablet 1 tablet  Status:  Discontinued        1 tablet Oral Every 12 hours 10/24/24 0138 10/24/24 0215   10/23/24 1600  cefTRIAXone  (ROCEPHIN ) 2 g in sodium chloride  0.9 % 100 mL IVPB        2 g 200 mL/hr over 30 Minutes Intravenous  Once 10/23/24 1556 10/23/24 1706        Medications  Scheduled Meds:  apixaban   5 mg Oral BID   atorvastatin   20 mg Oral Q M,W,F   B-complex with vitamin C  1 tablet Oral Daily   Chlorhexidine  Gluconate Cloth  6 each Topical Daily   escitalopram   10 mg Oral Daily   feeding supplement  237 mL Oral BID BM   levofloxacin   750 mg Oral Q48H   levothyroxine   25 mcg Oral Q0600   metoprolol  tartrate  12.5 mg Oral BID   pantoprazole   40 mg Oral BID   Continuous Infusions:  dextrose  5 % and 0.9 % NaCl     thiamine  (VITAMIN B1) injection 250 mg (10/26/24 0946)   PRN Meds:.ibuprofen , LORazepam     Subjective:   Joseph Simpson was seen and examined today.  No new events overnight.   Objective:   Vitals:   10/25/24 1811 10/25/24 2217 10/26/24 0521 10/26/24 1027  BP: 116/76 112/82 126/87 108/63  Pulse: 79 84 90 82  Resp:    16  Temp: 97.8 F (36.6 C) 97.9 F (36.6 C) 97.7 F (36.5 C)   TempSrc: Oral Oral Oral   SpO2: 98% 98% 91% 97%  Weight:       No intake or output data in the 24 hours ending 10/26/24 1638 Filed Weights   10/24/24 0233  Weight: 68.7 kg     Exam General exam: Appears calm and comfortable  Respiratory system: Clear to auscultation. Respiratory effort normal. Cardiovascular system: S1 & S2 heard, RRR.  Gastrointestinal system: Abdomen is nondistended, soft and nontender. Central nervous system: Alert and oriented to person only.  Extremities: Symmetric 5 x 5 power. Skin: No rashes,  Psychiatry:  Mood & affect appropriate.     Data Reviewed:  I have personally reviewed following labs and imaging studies   CBC Lab Results  Component Value Date   WBC 13.5 (H) 10/26/2024   RBC 5.54 10/26/2024   HGB 17.2 (H) 10/26/2024   HCT 52.1 (H) 10/26/2024   MCV 94.0 10/26/2024   MCH 31.0 10/26/2024   PLT 143 (L) 10/26/2024   MCHC 33.0 10/26/2024   RDW 13.3 10/26/2024   LYMPHSABS 1.6 10/23/2024  MONOABS 0.8 10/23/2024   EOSABS 0.0 10/23/2024   BASOSABS 0.0 10/23/2024     Last metabolic panel Lab Results  Component Value Date   NA 146 (H) 10/26/2024   K 4.1 10/26/2024   CL 112 (H) 10/26/2024   CO2 25 10/26/2024   BUN 27 (H) 10/26/2024   CREATININE 1.09 10/26/2024   GLUCOSE 93 10/26/2024   GFRNONAA >60 10/26/2024   GFRAA >60 12/15/2017   CALCIUM  8.9 10/26/2024   PHOS 3.0 01/06/2022   PROT 6.7 10/25/2024   ALBUMIN  3.3 (L) 10/25/2024   BILITOT 1.9 (H) 10/25/2024   ALKPHOS 68 10/25/2024   AST 22 10/25/2024   ALT 16 10/25/2024   ANIONGAP 9 10/26/2024    CBG (last 3)  No results for input(s): GLUCAP in the last 72 hours.    Coagulation Profile: No results for input(s): INR, PROTIME in the last 168 hours.   Radiology Studies: No results found.     Elgie Butter M.D. [facility] 10/26/2024, 4:38 PM  Available via Epic secure chat 7am-7pm After 7 pm, please refer to night coverage provider listed on amion.

## 2024-10-26 NOTE — [place] FL2 (Signed)
 [place]  MEDICAID FL2 LEVEL OF CARE FORM     IDENTIFICATION  Patient Name: Joseph Simpson Birthdate: 23-Aug-1935 Sex: male Admission Date (Current Location): 10/23/2024  [facility] and [place] Number:  Producer, Television/film/video and Address:  [facility] [place] [address] [place], [place] [address]      Provider Number: [id number]  Attending Physician Name and Address:  Cherlyn Labella, MD  Relative Name and Phone Number:  Billie (Son)  [phone]    Current Level of Care: Hospital Recommended Level of Care: Skilled Nursing Facility Prior Approval Number:    Date Approved/Denied:   PASRR Number: [id number]  Discharge Plan: SNF    Current Diagnoses: Patient Active Problem List   Diagnosis Date Noted   Encephalopathy acute 10/23/2024   Anxiety 05/17/2023   Delirium 11/06/2022   Altered mental status 11/05/2022   Abdominal pain, epigastric 11/02/2022   Intractable nausea and vomiting 10/30/2022   Gastritis and duodenitis 10/29/2022   GI bleed 01/07/2022   Left wrist pain 10/01/2021   Bilateral foot pain 07/04/2021   S/P hip replacement, left 02/11/2021   Hyperglycemia 12/21/2019   B12 deficiency 03/09/2019   Osteoarthritis of left hip 01/30/2019   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 12/29/2017   Paroxysmal atrial fibrillation (HCC)    Gastroparesis 02/15/2017   Hx of adenomatous colonic polyps 02/15/2017   Left inguinal hernia 07/12/2016   Hyperlipidemia 06/10/2016   PAC (premature atrial contraction) 10/25/2015   Squamous cell carcinoma in situ 09/09/2015   BPH (benign prostatic hyperplasia) 11/23/2014   Palpitations 07/31/2014   Cough 05/10/2014   Pulmonary nodule 05/10/2014   Dyspnea 02/27/2014   CAD (coronary artery disease)    Murmur 02/10/2012   Macular degeneration 11/17/2011   Essential hypertension 09/23/2007   GERD 09/23/2007    Orientation RESPIRATION BLADDER Height & Weight     Self  Normal Incontinent  Weight: 151 lb 7.3 oz (68.7 kg) Height:     BEHAVIORAL SYMPTOMS/MOOD NEUROLOGICAL BOWEL NUTRITION STATUS      Continent Diet (See DC Summary)  AMBULATORY STATUS COMMUNICATION OF NEEDS Skin   Extensive Assist Verbally Other (Comment) (Wound on sacrum)                       Personal Care Assistance Level of Assistance  Bathing, Feeding, Dressing Bathing Assistance: Maximum assistance Feeding assistance: Limited assistance Dressing Assistance: Maximum assistance     Functional Limitations Info  Sight, Hearing, Speech Sight Info: Impaired Hearing Info: Impaired Speech Info: Adequate    SPECIAL CARE FACTORS FREQUENCY  PT (By licensed PT), OT (By licensed OT)     PT Frequency: 5x/week OT Frequency: 5x/week            Contractures Contractures Info: Not present    Additional Factors Info  Code Status, Allergies Code Status Info: Full Allergies Info: Tape; Aspirin ; Percocet (Oxycodone -acetaminophen ); Vitamins (Apatate)           Current Medications (10/26/2024):  This is the current hospital active medication list Current Facility-Administered Medications  Medication Dose Route Frequency Provider Last Rate Last Admin   apixaban  (ELIQUIS ) tablet 5 mg  5 mg Oral BID Dena Charleston, MD   5 mg at 10/26/24 0946   atorvastatin  (LIPITOR) tablet 20 mg  20 mg Oral Q M,W,F Dena Charleston, MD   20 mg at 10/25/24 1011   B-complex with vitamin C tablet 1 tablet  1 tablet Oral Daily Elpidio Reyes DEL, MD  1 tablet at 10/26/24 0946   Chlorhexidine  Gluconate Cloth 2 % PADS 6 each  6 each Topical Daily Elpidio Reyes DEL, MD   6 each at 10/25/24 1011   escitalopram  (LEXAPRO ) tablet 10 mg  10 mg Oral Daily Dorrell, Lamar, MD   10 mg at 10/26/24 0946   feeding supplement (ENSURE PLUS HIGH PROTEIN) liquid 237 mL  237 mL Oral BID BM Elpidio Reyes DEL, MD       ibuprofen  (ADVIL ) tablet 400 mg  400 mg Oral Q6H PRN Dena Lamar, MD       levofloxacin  (LEVAQUIN ) tablet 750 mg  750 mg  Oral Q48H Elpidio Reyes DEL, MD   750 mg at 10/26/24 9053   levothyroxine  (SYNTHROID ) tablet 25 mcg  25 mcg Oral Q0600 Dena Lamar, MD   25 mcg at 10/25/24 0607   LORazepam  (ATIVAN ) tablet 1 mg  1 mg Oral BID PRN Dena Lamar, MD       metoprolol  tartrate (LOPRESSOR ) tablet 12.5 mg  12.5 mg Oral BID Daniels, Zaveon K, NP   12.5 mg at 10/26/24 0946   pantoprazole  (PROTONIX ) EC tablet 40 mg  40 mg Oral BID Daniels, Akito K, NP   40 mg at 10/26/24 0946   thiamine  (VITAMIN B1) 250 mg in sodium chloride  0.9 % 50 mL IVPB  250 mg Intravenous Daily Elpidio Reyes DEL, MD 105 mL/hr at 10/26/24 0946 250 mg at 10/26/24 0946     Discharge Medications: Please see discharge summary for a list of discharge medications.  Relevant Imaging Results:  Relevant Lab Results:   Additional Information SSN: [id number]  Jeoffrey LITTIE Moose, LCSWA

## 2024-10-27 LAB — BASIC METABOLIC PANEL WITH GFR
Anion gap: 9 (ref 5–15)
BUN: 19 mg/dL (ref 8–23)
CO2: 24 mmol/L (ref 22–32)
Calcium: 8.2 mg/dL — ABNORMAL LOW (ref 8.9–10.3)
Chloride: 109 mmol/L (ref 98–111)
Creatinine, Ser: 0.98 mg/dL (ref 0.61–1.24)
GFR, Estimated: >60 mL/min (ref >60)
Glucose, Bld: 138 mg/dL — ABNORMAL HIGH (ref 70–99)
Potassium: 4.1 mmol/L (ref 3.5–5.1)
Sodium: 142 mmol/L (ref 135–145)

## 2024-10-27 MED ORDER — LEVOFLOXACIN 750 MG PO TABS: 750.0000 mg | ORAL_TABLET | ORAL | 0 refills | Status: AC

## 2024-10-27 MED ORDER — VITAMIN B-1 250 MG PO TABS: 250.0000 mg | ORAL_TABLET | Freq: Every day | ORAL | 0 refills | Status: AC

## 2024-10-27 MED ORDER — THIAMINE HCL 100 MG PO TABS: 100.0000 mg | ORAL_TABLET | Freq: Every day | ORAL | 2 refills | Status: AC

## 2024-10-27 MED ORDER — ENSURE PLUS HIGH PROTEIN PO LIQD: 237.0000 mL | Freq: Two times a day (BID) | ORAL | Status: AC

## 2024-10-27 MED ORDER — B COMPLEX-C PO TABS: 1.0000 | ORAL_TABLET | Freq: Every day | ORAL | Status: AC

## 2024-10-27 NOTE — Plan of Care (Signed)
  Problem: Education: Goal: Knowledge of General Education information will improve Description: Including pain rating scale, medication(s)/side effects and non-pharmacologic comfort measures Outcome: Progressing   Problem: Clinical Measurements: Goal: Ability to maintain clinical measurements within normal limits will improve Outcome: Progressing   Problem: Activity: Goal: Risk for activity intolerance will decrease Outcome: Progressing   Problem: Nutrition: Goal: Adequate nutrition will be maintained Outcome: Progressing   Problem: Coping: Goal: Level of anxiety will decrease Outcome: Progressing   Problem: Elimination: Goal: Will not experience complications related to bowel motility Outcome: Progressing   Problem: Safety: Goal: Ability to remain free from injury will improve Outcome: Progressing   Problem: Skin Integrity: Goal: Risk for impaired skin integrity will decrease Outcome: Progressing   

## 2024-10-27 NOTE — Progress Notes (Signed)
 Physical Therapy Treatment Patient Details Name: Joseph Simpson MRN: [id number] DOB: [birth year] Today's Date: 10/27/2024   History of Present Illness Joseph Simpson is a 89 y.o. male who presented to the ED 10/23/24 d/t confusion. Pt has a chronic Foley catheter and lives in a nursing home. Was normal yesterday around dinnertime however became nonverbal and confused. Found to be afebrile hemodynamically.  Labs were obtained on presentation which showed WBC 12.4, hemoglobin 18.9, urinalysis concerning for infection.  Pt was started on ceftriaxone .  CT head was obtained which showed no acute findings. MRI head was obtained which showed hyperintensity involving the medial thalamus and midbrain concerning for Warnicke's versus toxic metabolic etiologies.  Patient was admitted for further workup. PMHx: atrial fibrillation, dementia, and hyperlipidemia.    PT Comments  Pt greeted supine in bed, pleasant and agreeable to PT session. He engaged in OOB mobility including transfers and gait. Pt ambulated ~40ft using RW with modA x2. He demonstrated a step-to gait pattern with heavy reliance on BUE support. Pt was unsteady demonstrating an excessive forward lean as he fatigued bringing trunk too far in front of his feet. Multi-modal cues for improved technique and safety awareness. Corrected pt's posture with modA x2 and facilitated return to recliner chair. Pt transferred to/from [facility]. He requires assist to maneuver RW and step by step cues for sequencing. Pt is extremely HOH and had minimal verbal engagement during session. Will continue to follow acutely and advance appropriately.      If plan is discharge home, recommend the following: Two people to help with walking and/or transfers;A lot of help with bathing/dressing/bathroom;Assistance with cooking/housework;Assist for transportation;Help with stairs or ramp for entrance;Supervision due to cognitive status   Can travel by private vehicle     No  Equipment  Recommendations  Wheelchair (measurements PT);Wheelchair cushion (measurements PT)    Recommendations for Other Services       Precautions / Restrictions Precautions Precautions: Fall Recall of Precautions/Restrictions: Impaired Restrictions Weight Bearing Restrictions Per Provider Order: No     Mobility  Bed Mobility Overal bed mobility: Needs Assistance Bed Mobility: Supine to Sit     Supine to sit: Mod assist, HOB elevated, Used rails, +2 for safety/equipment     General bed mobility comments: Pt sat up on R side of bed with increased time. Assist to bring BLE off EOB. Cues for sequencing. Guided pt's LUE to bed rail. Elevated trunk with modA. Use of bed pad to scoot pt fwd til feet flat.    Transfers Overall transfer level: Needs assistance Equipment used: Rolling walker (2 wheels) Transfers: Sit to/from Stand, Bed to chair/wheelchair/BSC Sit to Stand: Mod assist, +2 physical assistance, +2 safety/equipment   Step pivot transfers: Mod assist, +2 physical assistance, +2 safety/equipment       General transfer comment: Pt stood from lowest bed height. Cued proper hand placement using RW. Powered up with modA x2. Transferred to/from [facility] Assist to manuever RW and maintain upright posture and stability. Good eccentric control. Guided pt's UE to reach back for arm rest.    Ambulation/Gait Ambulation/Gait assistance: Mod assist, +2 physical assistance, +2 safety/equipment Gait Distance (Feet): 5 Feet Assistive device: Rolling walker (2 wheels) Gait Pattern/deviations: Step-to pattern, Decreased step length - right, Decreased step length - left, Decreased stride length, Decreased dorsiflexion - right, Decreased dorsiflexion - left, Trunk flexed Gait velocity: decreased     General Gait Details: Pt took short slow laborious steps with heavy reliance on BUE support on RW. He  demonstrated an excessive fwd lean. Frequently getting trunk too far over his feet becoming unsteady  requiring modA x2 to correct posture. Pt quickly fatigued. Brought recliner chair up behind him in order to safely return to seated position. Assist to manuever RW and step by step cues for seqeuncing.   Stairs             Wheelchair Mobility     Tilt Bed    Modified Rankin (Stroke Patients Only)       Balance Overall balance assessment: Needs assistance Sitting-balance support: Bilateral upper extremity supported, Feet supported Sitting balance-Leahy Scale: Fair Sitting balance - Comments: Pt sat EOB and on BSC with close supervision   Standing balance support: Bilateral upper extremity supported, During functional activity, Reliant on assistive device for balance Standing balance-Leahy Scale: Poor Standing balance comment: Pt dependent on RW and +2 assist.                            Communication Communication Communication: Impaired Factors Affecting Communication: Hearing impaired  Cognition Arousal: Alert Behavior During Therapy: Flat affect   PT - Cognitive impairments: History of cognitive impairments (Dementia)                       PT - Cognition Comments: Pt very HOH requiring moderate cues and frequent repetition of instructions. Limited verbal output. Following commands: Impaired Following commands impaired: Follows one step commands with increased time, Follows multi-step commands inconsistently    Cueing Cueing Techniques: Verbal cues, Gestural cues, Tactile cues, Visual cues  Exercises      General Comments General comments (skin integrity, edema, etc.): Pt had BM on BSC. Noted excessive straining. Mobility Specialists performed pericare. Pressure Wound on L buttock, red.      Pertinent Vitals/Pain Pain Assessment Pain Assessment: PAINAD Breathing: normal Negative Vocalization: occasional moan/groan, low speech, negative/disapproving quality Facial Expression: smiling or inexpressive Body Language: relaxed Consolability:  no need to console PAINAD Score: 1 Pain Intervention(s): Monitored during session, Limited activity within patient's tolerance, Repositioned    Home Living                          Prior Function            PT Goals (current goals can now be found in the care plan section) Acute Rehab PT Goals Patient Stated Goal: Feel better` PT Goal Formulation: Patient unable to participate in goal setting Time For Goal Achievement: 11/08/24 Potential to Achieve Goals: Good Progress towards PT goals: Progressing toward goals    Frequency    Min 2X/week      PT Plan      Co-evaluation              AM-PAC PT 6 Clicks Mobility   Outcome Measure  Help needed turning from your back to your side while in a flat bed without using bedrails?: A Lot Help needed moving from lying on your back to sitting on the side of a flat bed without using bedrails?: A Lot Help needed moving to and from a bed to a chair (including a wheelchair)?: Total Help needed standing up from a chair using your arms (e.g., wheelchair or bedside chair)?: Total Help needed to walk in hospital room?: Total Help needed climbing 3-5 steps with a railing? : Total 6 Click Score: 8    End of Session Equipment Utilized During  Treatment: Gait belt Activity Tolerance: Patient tolerated treatment well;Patient limited by fatigue Patient left: in chair;with call bell/phone within reach;with chair alarm set Nurse Communication: Mobility status PT Visit Diagnosis: Unsteadiness on feet (R26.81);Other abnormalities of gait and mobility (R26.89);Muscle weakness (generalized) (M62.81)     Time: 9172-9148 PT Time Calculation (min) (ACUTE ONLY): 24 min  Charges:    $Gait Training: 8-22 mins $Therapeutic Activity: 8-22 mins                       Randall SAUNDERS, PT, DPT Acute Rehabilitation Services Office: [phone] Secure Chat Preferred  Delon CHRISTELLA Callander 10/27/2024, 9:30 AM

## 2024-10-27 NOTE — TOC Transition Note (Addendum)
 Transition of Care [facility]) - Discharge Note   Patient Details  Name: Joseph Simpson MRN: [id number] Date of Birth: [birth year]  [facility] CM/SW Contact:  Joseph Simpson Joseph Simpson Phone Number: 10/27/2024, 2:52 PM   Clinical Narrative:    Patient will DC to: [facility] Anticipated DC date: 10/27/24 Family notified: Yes Transport by: [facility]   Per MD patient ready for DC to [facility]. RN to call report prior to discharge [phone]. RN, patient, patient's family, and facility notified of DC. Discharge Summary and FL2 sent to facility. DC packet on chart. Ambulance transport requested for patient.   CSW will sign off for now as social work intervention is no longer needed. Please consult us  again if new needs arise.     Final next level of care: Skilled Nursing Facility Barriers to Discharge: Barriers Resolved   Patient Goals and CMS Choice Patient states their goals for this hospitalization and ongoing recovery are:: SNF          Discharge Placement   Existing PASRR number confirmed : 10/27/24          Patient [facility] bed at: [facility] Patient to be transferred to facility by: [facility] Name of family member notified: Joseph Simpson Patient and family notified of of transfer: 10/27/24  Discharge Plan and Services Additional resources added to the After Visit Summary for                                       Social Drivers of Health (SDOH) Interventions SDOH Screenings   Food Insecurity: No Food Insecurity (10/23/2024)  Housing: Low Risk (10/23/2024)  Transportation Needs: No Transportation Needs (10/23/2024)  Utilities: Not At Risk (10/23/2024)  Depression (PHQ2-9): Low Risk (05/09/2024)  Financial Resource Strain: Low Risk (09/17/2022)  Physical Activity: Sufficiently Active (09/17/2022)  Social Connections: Moderately Isolated (10/23/2024)  Stress: No Stress Concern Present (09/17/2022)  Tobacco Use: Medium Risk (10/23/2024)     Readmission Risk  Interventions     No data to display

## 2024-10-27 NOTE — TOC Progression Note (Addendum)
 Transition of Care [facility]) - Progression Note    Patient Details  Name: Joseph Simpson MRN: [id number] Date of Birth: 1934/11/20  [facility] CM/SW Contact  Neomia Herbel LITTIE Moose, [place] Phone Number: 10/27/2024, 10:08 AM  Clinical Narrative:    CSW emailed SNF bed offers to pt son, Eugene. CSW will follow up on facility choice.  10:26 AM- CSW received a call from Pt daughter in law, she stated family would like to proceed with [facility]. CSW informed facility and will continue to follow.   Expected Discharge Plan: Skilled Nursing Facility Barriers to Discharge: Continued Medical Work up, SNF Pending bed offer               Expected Discharge Plan and Services       Living arrangements for the past 2 months: Assisted Living Facility                                       Social Drivers of Health (SDOH) Interventions SDOH Screenings   Food Insecurity: No Food Insecurity (10/23/2024)  Housing: Low Risk (10/23/2024)  Transportation Needs: No Transportation Needs (10/23/2024)  Utilities: Not At Risk (10/23/2024)  Depression (PHQ2-9): Low Risk (05/09/2024)  Financial Resource Strain: Low Risk (09/17/2022)  Physical Activity: Sufficiently Active (09/17/2022)  Social Connections: Moderately Isolated (10/23/2024)  Stress: No Stress Concern Present (09/17/2022)  Tobacco Use: Medium Risk (10/23/2024)    Readmission Risk Interventions     No data to display

## 2024-10-27 NOTE — Discharge Summary (Signed)
 Physician Discharge Summary   Patient: Joseph Simpson MRN: [id number] DOB: Aug 03, 1935  Admit date:     10/23/2024  Discharge date: 10/27/2024  Discharge Physician: Elgie Butter   PCP: Katrinka Garnette KIDD, MD   Recommendations at discharge:  Please follow up with PCP in one week.  Please check cbc and bmp in one week.  Recommend outpatient follow up with neurology for evaluation of Dementia.   Discharge Diagnoses: Principal Problem:   Encephalopathy acute  Resolved Problems:   * No resolved hospital problems. *  Hospital Course: 89 y.o. male with medical history significant of with history of atrial fibrillation, dementia, hyperlipidemia who presented to the Emergency Department due to confusion.  Patient has a chronic Foley catheter and lives in a nursing home.  Was normal yesterday around dinnertime however became nonverbal and confused.  He presents emergency department where he was found to be afebrile hemodynamically stable.  Labs were obtained on presentation which showed WBC 12.4, hemoglobin 18.9, urinalysis concerning for infection.  Patient was started on ceftriaxone .  CT head was obtained which showed no acute findings.  MR head was obtained which showed hyperintensity involving the medial thalamus and midbrain concerning for Warnicke's versus toxic metabolic etiologies.  Patient was admitted for further workup. On evaluation patient was largely unresponsive.  Initial UA concerning for UTI and he was started on antibiotics.  Since that time he has had notable improvement in his condition.  Also started on high-dose thiamine  which has been helpful as well.    Assessment and Plan:  Acute encephalopathy probably secondary to Wernicke's versus metabolic encephalopathy Probably secondary to urinary tract infection. Patient was started on IV antibiotics along with high-dose thiamine .  Neurology consulted and recommendations given.       Urinary tract infection secondary to chronic  Foley catheter Urine cultures growing Citrobacter sensitive to flouroquinolones Continue with Levaquin  to complete the course.     Atrial fibrillation Rate controlled and on Eliquis  for anticoagulation.       GERD Stable   Hypernatremia Probably secondary to free water  deficit Resolved with IV fluids.      Acute kidney injury Creatinine has improved with IV fluids, continue to monitor. Creatinine remains stable.    Hypothyroidism Continue with Synthroid .     Depression Continue with Lexapro .       Hyperbilirubinemia Chronic elevation of bilirubin around 2.  Recommend outpatient follow up with PCP      Hyperlipidemia Continue with Lipitor.       Cognitive impairment Recommend outpatient follow-up with neurology for evaluation of dementia.    Mild thrombocytopenia No signs of bleeding.    Leukocytosis Suspect from UTI. Recommend checking labs next week to evaluate for resolution of leukocytosis.      RN Pressure Injury Documentation: Wound 10/23/24 2003 Pressure Injury Sacrum Bilateral;Mid Stage 1 -  Intact skin with non-blanchable redness of a localized area usually over a bony prominence. (Active)  Present on admission Local wound care.              Estimated body mass index is 20.54 kg/m as calculated from the following:   Height as of 10/09/24: 6' (1.829 m).   Weight as of this encounter: 68.7 kg.          Consultants: none.  Procedures performed: none.   Disposition: Skilled nursing facility Diet recommendation:  Regular diet DISCHARGE MEDICATION: Allergies as of 10/27/2024       Reactions   Tape Other (See Comments)  SKIN IS FRAGILE AND WILL TEAR EASILY- PAPER TAPE ONLY   Aspirin  Other (See Comments)   UPSETS THE STOMACH   Percocet [oxycodone -acetaminophen ] Nausea And Vomiting   Vitamins [apatate] Other (See Comments)   Bothers the stomach        Medication List     STOP taking these medications    cephALEXin   500 MG capsule Commonly known as: KEFLEX    melatonin 5 MG Tabs       TAKE these medications    atorvastatin  20 MG tablet Commonly known as: LIPITOR TAKE 1 TABLET BY MOUTH 3 TIMES A WEEK What changed:  how much to take how to take this when to take this additional instructions   B-complex with vitamin C tablet Take 1 tablet by mouth daily. Start taking on: October 28, 2024   Eliquis  5 MG Tabs tablet Generic drug: apixaban  TAKE 1 TABLET BY MOUTH TWICE A DAY   escitalopram  10 MG tablet Commonly known as: LEXAPRO  TAKE 1 TABLET BY MOUTH EVERY DAY   feeding supplement Liqd Take 237 mLs by mouth 2 (two) times daily between meals. Start taking on: October 28, 2024   levofloxacin  750 MG tablet Commonly known as: LEVAQUIN  Take 1 tablet (750 mg total) by mouth every other day for 1 dose. Start taking on: October 28, 2024   levothyroxine  25 MCG tablet Commonly known as: SYNTHROID  Take 25 mcg by mouth daily.   LORazepam  1 MG tablet Commonly known as: ATIVAN  TAKE 1 TAB BY MOUTH 2 TIMES DAILY AS NEEDED FOR ANXIETY*NEEDS TO BE SUPERVISED 8-10HRS AFTER USE-FALL RISK What changed:  how much to take how to take this when to take this additional instructions   losartan  25 MG tablet Commonly known as: COZAAR  TAKE 0.5-1 TAB DAILY. IF BLOOD PRESSURE OVER 145 WHEN YOU CHECK- OK TO TAKE OTHER HALF TABLET ONCE PER DAY (SO TOTAL OF 25MG  PER DAY) What changed: See the new instructions.   metoprolol  tartrate 25 MG tablet Commonly known as: LOPRESSOR  TAKE 1/2 TABLET BY MOUTH TWICE A DAY   pantoprazole  40 MG tablet Commonly known as: PROTONIX  TAKE 1 TABLET BY MOUTH TWICE A DAY   vitamin B-1 250 MG tablet Take 1 tablet (250 mg total) by mouth daily for 1 day. Start taking on: October 28, 2024   thiamine  100 MG tablet Commonly known as: VITAMIN B1 Take 1 tablet (100 mg total) by mouth daily. Start taking on: October 29, 2024        Contact information for follow-up  providers     Katrinka Garnette KIDD, MD. Schedule an appointment as soon as possible for a visit in 1 week(s).   Specialty: Family Medicine Contact information: [address] [place] [address] [phone]              Contact information for after-discharge care     Destination     [facility] .   Service: Skilled Nursing Contact information: [address] [place]  [address] [phone]                    Discharge Exam: Fredricka Weights   10/24/24 0233  Weight: 68.7 kg   General exam: Appears calm and comfortable  Respiratory system: Clear to auscultation. Respiratory effort normal. Cardiovascular system: S1 & S2 heard, RRR.  Gastrointestinal system: Abdomen is nondistended, soft and nontender.  Central nervous system: Alert and oriented.  Extremities: Symmetric 5 x 5 power. Skin: No rashes, Psychiatry:  Mood & affect appropriate.    Condition at discharge: fair  The results of significant diagnostics from this hospitalization (including imaging, microbiology, ancillary and laboratory) are listed below for reference.   Imaging Studies: EEG adult Result Date: 10/24/2024 Shelton Arlin KIDD, MD     10/24/2024  4:04 PM Patient Name: TAVIEN CHESTNUT MRN: [id number] Epilepsy Attending: Arlin KIDD Shelton Referring Physician/Provider: Voncile Isles, MD Date: 10/24/2024 Duration: 23.41 mins Patient history: 89yo M with ams. EEG to evaluate for seizure Level of alertness: Awake AEDs during EEG study: None Technical aspects: This EEG study was done with scalp electrodes positioned according to the 10-20 International system of electrode placement. Electrical activity was reviewed with band pass filter of 1-70Hz , sensitivity of 7 uV/mm, display speed of 54mm/sec with a 60Hz  notched filter applied as appropriate. EEG data were recorded continuously and digitally stored.  Video monitoring was available and reviewed as  appropriate. Description: EEG showed continuous generalized predominantly 5 to 6 Hz theta slowing admixed with intermittent 2-3hz  delta slowing. Hyperventilation and photic stimulation were not performed.   Study was technically difficult due to significant myogenic artifact ABNORMALITY - Continuous slow, generalized IMPRESSION: This technically difficult study is suggestive of generalized cerebral dysfunction (encephalopathy). No seizures or epileptiform discharges were seen throughout the recording. Arlin KIDD Shelton   MR BRAIN WO CONTRAST Result Date: 10/23/2024 EXAM: MRI BRAIN WITHOUT CONTRAST 10/23/2024 04:34:19 PM TECHNIQUE: Multiplanar multisequence MRI of the head/brain was performed without the administration of intravenous contrast. COMPARISON: None available. CLINICAL HISTORY: Clinical history of neurologic deficit with concern for stroke. FINDINGS: BRAIN AND VENTRICLES: There is abnormal FLAIR hyperintensity along the medial aspect of both thalami. There is additional FLAIR signal abnormality extending into the midbrain, particularly involving the periaqueductal gray matter and the tectum of the midbrain. There is mild diffusion signal abnormality in this region without evidence of definite restricted diffusion, likely reflecting T2 shine through. No definite acute infarct identified. No intracranial hemorrhage. No mass. No significant mass effect. No midline shift. No hydrocephalus. Scattered areas of T2 and FLAIR hyperintensity suggestive of mild to moderate chronic microvascular ischemic changes. There is mild generalized parenchymal volume loss. The sella is unremarkable. Normal flow voids. ORBITS: Bilateral lens replacement. SINUSES AND MASTOIDS: No acute abnormality. BONES AND SOFT TISSUES: Normal marrow signal. Postsurgical changes in the visualized upper cervical spine. No acute soft tissue abnormality. IMPRESSION: 1. Abnormal FLAIR hyperintensity involving the medial thalami and midbrain,  including the periaqueductal gray matter and tectum. Findings concerning for Wernicke encephalopathy versus other toxic/metabolic etiologies as well as Creutzfeldt-Jakob disease. 2.  No definite acute infarct identified. 3. Mild to moderate chronic microvascular ischemic changes and mild generalized parenchymal volume loss. Electronically signed by: Donnice Mania MD 10/23/2024 04:52 PM EST RP Workstation: [id number]   CT Head Wo Contrast Result Date: 10/23/2024 EXAM: CT HEAD WITHOUT CONTRAST 10/23/2024 11:40:47 AM TECHNIQUE: CT of the head was performed without the administration of intravenous contrast. Automated exposure control, iterative reconstruction, and/or weight based adjustment of the mA/kV was utilized to reduce the radiation dose to as low as reasonably achievable. COMPARISON: CT of the head dated 11/05/2022. CLINICAL HISTORY: Mental status change, unknown cause. FINDINGS: BRAIN AND VENTRICLES: No acute hemorrhage. No evidence of acute infarct. No hydrocephalus. No extra-axial collection. No mass effect or midline shift. Age-related atrophy with mild cerebral volume loss. Mild-to-moderate periventricular white matter disease. Moderate atheromatous calcifications within the carotid siphons. ORBITS: No acute abnormality. Status post bilateral lens replacement. SINUSES: No acute abnormality. SOFT TISSUES  AND SKULL: No acute soft tissue abnormality. No skull fracture. IMPRESSION: 1. No acute intracranial abnormality. 2. Mild cerebral volume loss and mild periventricular white matter disease. Electronically signed by: Evalene Coho MD 10/23/2024 12:17 PM EST RP Workstation: [id number]   CT Renal Stone Study Result Date: 10/09/2024 EXAM: CT ABDOMEN AND PELVIS WITHOUT CONTRAST 10/09/2024 10:17:00 PM TECHNIQUE: CT of the abdomen and pelvis was performed without the administration of intravenous contrast. Multiplanar reformatted images are provided for review. Automated exposure control, iterative  reconstruction, and/or weight-based adjustment of the mA/kV was utilized to reduce the radiation dose to as low as reasonably achievable. COMPARISON: 09/23/2024 CLINICAL HISTORY: Hematuria and left-sided flank pain. FINDINGS: LOWER CHEST: Chronic interstitial changes are noted in the bases bilaterally. No focal infiltrate is seen. LIVER: The liver is within normal limits. GALLBLADDER AND BILE DUCTS: The gallbladder is within normal limits. No biliary ductal dilatation. SPLEEN: The spleen is unremarkable. PANCREAS: The pancreas is unremarkable. ADRENAL GLANDS: The adrenal glands are within normal limits. KIDNEYS, URETERS AND BLADDER: Bilateral nonobstructing renal calculi are seen. The largest of these on the left measures 3 mm. The largest of these on the right lies in the lower pole, measuring up to 10 mm. No obstructive changes are seen. The bladder is decompressed by a Foley catheter. Multiple posteriorly layering stones are noted within the bladder, similar to that seen on the prior exam. Wall thickening and perivesicular stranding is noted which may represent some underlying UTI. Correlate with lab values. GI AND BOWEL: Postsurgical changes about the gastroesophageal junction are seen. Small bowel and stomach show no acute abnormality. No obstructive or inflammatory changes of the colon are seen. Diverticular disease is noted without evidence of diverticulitis. The appendix is not well visualized consistent with the surgical history. PERITONEUM AND RETROPERITONEUM: No ascites. No free air. VASCULATURE: Aortic calcifications are seen without an aneurysmal dilatation. LYMPH NODES: No lymphadenopathy. REPRODUCTIVE ORGANS: The prostate is within normal limits. BONES AND SOFT TISSUES: Left hip replacement is noted. Degenerative changes of the lumbar spine are seen. IMPRESSION: 1. Bilateral nonobstructing renal calculi, largest measuring 10 mm in the right lower pole and 3 mm on the left. 2. Multiple posteriorly  layering bladder stones, similar to prior exam. 3. Bladder wall thickening and perivesical stranding, possibly representing underlying urinary tract infection. Recommend correlation with laboratory evaluation and urinalysis. Electronically signed by: Oneil Devonshire MD 10/09/2024 10:30 PM EST RP Workstation: [id number]    Microbiology: Results for orders placed or performed during the hospital encounter of 10/23/24  Culture, blood (single)     Status: None (Preliminary result)   Collection Time: 10/23/24  4:30 PM   Specimen: BLOOD LEFT ARM  Result Value Ref Range Status   Specimen Description BLOOD LEFT ARM  Final   Special Requests   Final    BOTTLES DRAWN AEROBIC AND ANAEROBIC Blood Culture adequate volume   Culture   Final    NO GROWTH 4 DAYS Performed at [facility], [address] [place], [place] [address]    Report Status PENDING  Incomplete    Labs: CBC: Recent Labs  Lab 10/23/24 1114 10/25/24 0136 10/26/24 0839  WBC 12.4* 10.4 13.5*  NEUTROABS 10.0*  --   --   HGB 18.9* 17.5* 17.2*  HCT 56.8* 53.5* 52.1*  MCV 92.5 94.4 94.0  PLT 213 145* 143*   Basic Metabolic Panel: Recent Labs  Lab 10/23/24 1114 10/25/24 0136 10/26/24 0839 10/27/24 1213  NA 144 147* 146* 142  K 4.1  3.8 4.1 4.1  CL 104 111 112* 109  CO2 24 25 25 24   GLUCOSE 122* 88 93 138*  BUN 36* 37* 27* 19  CREATININE 1.23 1.34* 1.09 0.98  CALCIUM  9.2 9.1 8.9 8.2*   Liver Function Tests: Recent Labs  Lab 10/23/24 1114 10/25/24 0136  AST 24 22  ALT 21 16  ALKPHOS 86 68  BILITOT 2.1* 1.9*  PROT 7.9 6.7  ALBUMIN  3.8 3.3*   CBG: Recent Labs  Lab 10/23/24 1158  GLUCAP 116*    Discharge time spent: 38 minutes.   Signed: Elgie Butter, MD [facility] 10/27/2024

## 2024-10-27 NOTE — Progress Notes (Signed)
 Joseph Simpson to be D/C'd Skilled nursing facility per MD order.  Discussed with the patient and all questions fully answered.  IV catheter discontinued intact. Site without signs and symptoms of complications. Dressing and pressure applied.  An After Visit Summary was printed and given to [facility] for receiving facility. Signed DNR form sent with [facility].  Report called to receiving nurse, Zebedee at [facility].  Patient escorted via stretcher, and d/c via non emergency ambulance.  [name] 10/27/2024 3:52 PM

## 2024-10-28 LAB — CULTURE, BLOOD (SINGLE)
Culture: NO GROWTH
Special Requests: ADEQUATE

## 2024-10-30 DIAGNOSIS — R339 Retention of urine, unspecified: Secondary | ICD-10-CM | POA: Diagnosis not present

## 2024-10-30 DIAGNOSIS — F0390 Unspecified dementia without behavioral disturbance: Secondary | ICD-10-CM | POA: Diagnosis not present

## 2024-10-30 DIAGNOSIS — I4891 Unspecified atrial fibrillation: Secondary | ICD-10-CM | POA: Diagnosis not present

## 2024-10-30 DIAGNOSIS — N319 Neuromuscular dysfunction of bladder, unspecified: Secondary | ICD-10-CM | POA: Diagnosis not present

## 2024-11-01 ENCOUNTER — Ambulatory Visit: Payer: Self-pay | Admitting: Family Medicine

## 2024-11-01 LAB — VITAMIN B1: Vitamin B1 (Thiamine): 24.1 nmol/L — ABNORMAL LOW (ref 66.5–200.0)

## 2024-11-20 ENCOUNTER — Ambulatory Visit: Admitting: Family Medicine

## 2024-12-19 ENCOUNTER — Telehealth: Payer: Self-pay

## 2024-12-19 NOTE — Telephone Encounter (Signed)
 Transition Care Management Unsuccessful Follow-up Telephone Call  Date of discharge and from where:  ?- [facility] - Emergency  Attempts:  1st Attempt  Reason for unsuccessful TCM follow-up call:  Left voice message
